# Patient Record
Sex: Female | Born: 1935 | Race: White | Hispanic: No | Smoking: Never smoker
Health system: Southern US, Community
[De-identification: ages and names within clinical notes are randomized; demographics above are authoritative.]

## PROBLEM LIST (undated history)

## (undated) DIAGNOSIS — I2510 Atherosclerotic heart disease of native coronary artery without angina pectoris: Secondary | ICD-10-CM

## (undated) DIAGNOSIS — I10 Essential (primary) hypertension: Secondary | ICD-10-CM

## (undated) DIAGNOSIS — R079 Chest pain, unspecified: Secondary | ICD-10-CM

## (undated) DIAGNOSIS — R519 Headache, unspecified: Secondary | ICD-10-CM

## (undated) DIAGNOSIS — K219 Gastro-esophageal reflux disease without esophagitis: Secondary | ICD-10-CM

## (undated) DIAGNOSIS — G8929 Other chronic pain: Secondary | ICD-10-CM

## (undated) DIAGNOSIS — G4733 Obstructive sleep apnea (adult) (pediatric): Secondary | ICD-10-CM

## (undated) DIAGNOSIS — F32A Depression, unspecified: Secondary | ICD-10-CM

## (undated) DIAGNOSIS — Z8719 Personal history of other diseases of the digestive system: Secondary | ICD-10-CM

## (undated) DIAGNOSIS — R51 Headache: Secondary | ICD-10-CM

## (undated) DIAGNOSIS — K449 Diaphragmatic hernia without obstruction or gangrene: Secondary | ICD-10-CM

## (undated) DIAGNOSIS — Z8659 Personal history of other mental and behavioral disorders: Secondary | ICD-10-CM

## (undated) DIAGNOSIS — M545 Low back pain, unspecified: Secondary | ICD-10-CM

## (undated) DIAGNOSIS — E785 Hyperlipidemia, unspecified: Secondary | ICD-10-CM

## (undated) DIAGNOSIS — C8330 Diffuse large B-cell lymphoma, unspecified site: Secondary | ICD-10-CM

## (undated) DIAGNOSIS — I6523 Occlusion and stenosis of bilateral carotid arteries: Secondary | ICD-10-CM

## (undated) DIAGNOSIS — Z853 Personal history of malignant neoplasm of breast: Secondary | ICD-10-CM

## (undated) DIAGNOSIS — Z955 Presence of coronary angioplasty implant and graft: Secondary | ICD-10-CM

## (undated) DIAGNOSIS — R0600 Dyspnea, unspecified: Secondary | ICD-10-CM

## (undated) DIAGNOSIS — M1990 Unspecified osteoarthritis, unspecified site: Secondary | ICD-10-CM

## (undated) DIAGNOSIS — F329 Major depressive disorder, single episode, unspecified: Secondary | ICD-10-CM

## (undated) DIAGNOSIS — R9389 Abnormal findings on diagnostic imaging of other specified body structures: Secondary | ICD-10-CM

## (undated) DIAGNOSIS — F419 Anxiety disorder, unspecified: Secondary | ICD-10-CM

## (undated) DIAGNOSIS — Z860100 Personal history of colon polyps, unspecified: Secondary | ICD-10-CM

## (undated) DIAGNOSIS — C8580 Other specified types of non-Hodgkin lymphoma, unspecified site: Secondary | ICD-10-CM

## (undated) DIAGNOSIS — N950 Postmenopausal bleeding: Secondary | ICD-10-CM

## (undated) HISTORY — DX: Unspecified osteoarthritis, unspecified site: M19.90

## (undated) HISTORY — DX: Depression, unspecified: F32.A

## (undated) HISTORY — DX: Other chronic pain: G89.29

## (undated) HISTORY — DX: Chest pain, unspecified: R07.9

## (undated) HISTORY — DX: Hyperlipidemia, unspecified: E78.5

## (undated) HISTORY — DX: Headache, unspecified: R51.9

## (undated) HISTORY — PX: CORONARY ANGIOPLASTY WITH STENT PLACEMENT: SHX49

## (undated) HISTORY — PX: INNER EAR SURGERY: SHX679

## (undated) HISTORY — PX: CARDIAC CATHETERIZATION: SHX172

## (undated) HISTORY — DX: Anxiety disorder, unspecified: F41.9

## (undated) HISTORY — DX: Atherosclerotic heart disease of native coronary artery without angina pectoris: I25.10

## (undated) HISTORY — DX: Major depressive disorder, single episode, unspecified: F32.9

## (undated) HISTORY — PX: KNEE ARTHROSCOPY: SUR90

## (undated) HISTORY — PX: TONSILLECTOMY: SUR1361

## (undated) HISTORY — DX: Headache: R51

---

## 2000-04-20 ENCOUNTER — Encounter: Admission: RE | Admit: 2000-04-20 | Discharge: 2000-04-20 | Payer: Self-pay | Admitting: *Deleted

## 2000-06-13 ENCOUNTER — Other Ambulatory Visit: Admission: RE | Admit: 2000-06-13 | Discharge: 2000-06-13 | Payer: Self-pay | Admitting: Gynecology

## 2000-12-22 ENCOUNTER — Other Ambulatory Visit: Admission: RE | Admit: 2000-12-22 | Discharge: 2000-12-22 | Payer: Self-pay | Admitting: Radiology

## 2000-12-22 ENCOUNTER — Encounter: Payer: Self-pay | Admitting: Family Medicine

## 2000-12-22 ENCOUNTER — Encounter: Admission: RE | Admit: 2000-12-22 | Discharge: 2000-12-22 | Payer: Self-pay | Admitting: Family Medicine

## 2000-12-22 ENCOUNTER — Encounter (INDEPENDENT_AMBULATORY_CARE_PROVIDER_SITE_OTHER): Payer: Self-pay | Admitting: *Deleted

## 2001-01-02 ENCOUNTER — Encounter: Payer: Self-pay | Admitting: Surgery

## 2001-01-03 ENCOUNTER — Encounter (INDEPENDENT_AMBULATORY_CARE_PROVIDER_SITE_OTHER): Payer: Self-pay | Admitting: *Deleted

## 2001-01-03 HISTORY — PX: MASTECTOMY WITH AXILLARY LYMPH NODE DISSECTION: SHX5661

## 2001-01-04 ENCOUNTER — Inpatient Hospital Stay (HOSPITAL_COMMUNITY): Admission: AD | Admit: 2001-01-04 | Discharge: 2001-01-05 | Payer: Self-pay | Admitting: Surgery

## 2001-01-31 ENCOUNTER — Ambulatory Visit (HOSPITAL_COMMUNITY): Admission: RE | Admit: 2001-01-31 | Discharge: 2001-01-31 | Payer: Self-pay | Admitting: Oncology

## 2001-01-31 ENCOUNTER — Encounter: Payer: Self-pay | Admitting: Oncology

## 2001-02-02 ENCOUNTER — Ambulatory Visit (HOSPITAL_COMMUNITY): Admission: RE | Admit: 2001-02-02 | Discharge: 2001-02-02 | Payer: Self-pay | Admitting: Oncology

## 2001-02-02 ENCOUNTER — Encounter: Payer: Self-pay | Admitting: Oncology

## 2001-02-07 ENCOUNTER — Encounter: Payer: Self-pay | Admitting: Oncology

## 2001-02-07 ENCOUNTER — Ambulatory Visit (HOSPITAL_COMMUNITY): Admission: RE | Admit: 2001-02-07 | Discharge: 2001-02-07 | Payer: Self-pay | Admitting: Oncology

## 2001-02-09 ENCOUNTER — Ambulatory Visit (HOSPITAL_BASED_OUTPATIENT_CLINIC_OR_DEPARTMENT_OTHER): Admission: RE | Admit: 2001-02-09 | Discharge: 2001-02-09 | Payer: Self-pay | Admitting: Surgery

## 2001-02-09 ENCOUNTER — Encounter: Payer: Self-pay | Admitting: Surgery

## 2001-03-02 ENCOUNTER — Encounter: Payer: Self-pay | Admitting: Oncology

## 2001-03-02 ENCOUNTER — Ambulatory Visit (HOSPITAL_COMMUNITY): Admission: RE | Admit: 2001-03-02 | Discharge: 2001-03-02 | Payer: Self-pay | Admitting: Oncology

## 2001-03-10 ENCOUNTER — Encounter: Payer: Self-pay | Admitting: Oncology

## 2001-03-10 ENCOUNTER — Ambulatory Visit (HOSPITAL_COMMUNITY): Admission: RE | Admit: 2001-03-10 | Discharge: 2001-03-10 | Payer: Self-pay | Admitting: Oncology

## 2001-03-30 ENCOUNTER — Encounter: Payer: Self-pay | Admitting: Surgery

## 2001-03-30 ENCOUNTER — Ambulatory Visit (HOSPITAL_BASED_OUTPATIENT_CLINIC_OR_DEPARTMENT_OTHER): Admission: RE | Admit: 2001-03-30 | Discharge: 2001-03-30 | Payer: Self-pay | Admitting: Surgery

## 2001-07-17 ENCOUNTER — Other Ambulatory Visit: Admission: RE | Admit: 2001-07-17 | Discharge: 2001-07-17 | Payer: Self-pay | Admitting: Gynecology

## 2001-08-31 ENCOUNTER — Ambulatory Visit (HOSPITAL_BASED_OUTPATIENT_CLINIC_OR_DEPARTMENT_OTHER): Admission: RE | Admit: 2001-08-31 | Discharge: 2001-08-31 | Payer: Self-pay | Admitting: Surgery

## 2001-09-04 ENCOUNTER — Encounter (INDEPENDENT_AMBULATORY_CARE_PROVIDER_SITE_OTHER): Payer: Self-pay | Admitting: *Deleted

## 2001-09-04 ENCOUNTER — Ambulatory Visit (HOSPITAL_COMMUNITY): Admission: RE | Admit: 2001-09-04 | Discharge: 2001-09-04 | Payer: Self-pay | Admitting: Gastroenterology

## 2001-10-30 ENCOUNTER — Ambulatory Visit (HOSPITAL_COMMUNITY): Admission: RE | Admit: 2001-10-30 | Discharge: 2001-10-30 | Payer: Self-pay | Admitting: Surgery

## 2001-10-30 ENCOUNTER — Encounter: Payer: Self-pay | Admitting: Surgery

## 2001-12-01 ENCOUNTER — Encounter: Payer: Self-pay | Admitting: Oncology

## 2001-12-01 ENCOUNTER — Encounter: Admission: RE | Admit: 2001-12-01 | Discharge: 2001-12-01 | Payer: Self-pay | Admitting: Oncology

## 2001-12-04 ENCOUNTER — Encounter: Admission: RE | Admit: 2001-12-04 | Discharge: 2001-12-04 | Payer: Self-pay | Admitting: Surgery

## 2001-12-04 ENCOUNTER — Encounter: Payer: Self-pay | Admitting: Surgery

## 2001-12-05 ENCOUNTER — Encounter: Admission: RE | Admit: 2001-12-05 | Discharge: 2001-12-27 | Payer: Self-pay | Admitting: Orthopedic Surgery

## 2001-12-28 ENCOUNTER — Ambulatory Visit (HOSPITAL_COMMUNITY): Admission: RE | Admit: 2001-12-28 | Discharge: 2001-12-28 | Payer: Self-pay | Admitting: Oncology

## 2001-12-28 ENCOUNTER — Encounter: Payer: Self-pay | Admitting: Oncology

## 2002-01-12 ENCOUNTER — Ambulatory Visit (HOSPITAL_COMMUNITY): Admission: RE | Admit: 2002-01-12 | Discharge: 2002-01-12 | Payer: Self-pay | Admitting: Oncology

## 2002-01-12 ENCOUNTER — Encounter: Payer: Self-pay | Admitting: Oncology

## 2002-02-15 ENCOUNTER — Encounter: Payer: Self-pay | Admitting: Internal Medicine

## 2002-08-16 ENCOUNTER — Encounter: Payer: Self-pay | Admitting: Surgery

## 2002-08-16 ENCOUNTER — Ambulatory Visit (HOSPITAL_COMMUNITY): Admission: RE | Admit: 2002-08-16 | Discharge: 2002-08-16 | Payer: Self-pay | Admitting: Surgery

## 2002-08-24 ENCOUNTER — Other Ambulatory Visit: Admission: RE | Admit: 2002-08-24 | Discharge: 2002-08-24 | Payer: Self-pay | Admitting: Gynecology

## 2002-10-19 ENCOUNTER — Ambulatory Visit (HOSPITAL_COMMUNITY): Admission: RE | Admit: 2002-10-19 | Discharge: 2002-10-19 | Payer: Self-pay | Admitting: Oncology

## 2002-10-19 ENCOUNTER — Encounter: Payer: Self-pay | Admitting: Oncology

## 2002-10-29 ENCOUNTER — Ambulatory Visit (HOSPITAL_COMMUNITY): Admission: RE | Admit: 2002-10-29 | Discharge: 2002-10-29 | Payer: Self-pay | Admitting: Oncology

## 2002-10-29 ENCOUNTER — Encounter: Payer: Self-pay | Admitting: Oncology

## 2002-12-07 ENCOUNTER — Encounter: Admission: RE | Admit: 2002-12-07 | Discharge: 2002-12-07 | Payer: Self-pay | Admitting: Surgery

## 2002-12-07 ENCOUNTER — Encounter: Payer: Self-pay | Admitting: Surgery

## 2003-02-11 ENCOUNTER — Ambulatory Visit (HOSPITAL_COMMUNITY): Admission: RE | Admit: 2003-02-11 | Discharge: 2003-02-11 | Payer: Self-pay | Admitting: Oncology

## 2003-02-13 ENCOUNTER — Ambulatory Visit (HOSPITAL_COMMUNITY): Admission: RE | Admit: 2003-02-13 | Discharge: 2003-02-13 | Payer: Self-pay | Admitting: Oncology

## 2003-02-16 HISTORY — PX: CHOLECYSTECTOMY: SHX55

## 2003-04-04 ENCOUNTER — Encounter: Admission: RE | Admit: 2003-04-04 | Discharge: 2003-04-04 | Payer: Self-pay | Admitting: Internal Medicine

## 2003-04-06 ENCOUNTER — Ambulatory Visit (HOSPITAL_COMMUNITY): Admission: RE | Admit: 2003-04-06 | Discharge: 2003-04-06 | Payer: Self-pay | Admitting: Oncology

## 2003-04-12 ENCOUNTER — Encounter: Admission: RE | Admit: 2003-04-12 | Discharge: 2003-06-04 | Payer: Self-pay | Admitting: Internal Medicine

## 2003-05-01 ENCOUNTER — Encounter: Payer: Self-pay | Admitting: Orthopedic Surgery

## 2003-05-17 ENCOUNTER — Ambulatory Visit (HOSPITAL_COMMUNITY): Admission: RE | Admit: 2003-05-17 | Discharge: 2003-05-17 | Payer: Self-pay | Admitting: Orthopedic Surgery

## 2003-06-07 ENCOUNTER — Encounter: Admission: RE | Admit: 2003-06-07 | Discharge: 2003-06-07 | Payer: Self-pay | Admitting: Surgery

## 2003-06-20 ENCOUNTER — Ambulatory Visit (HOSPITAL_COMMUNITY): Admission: RE | Admit: 2003-06-20 | Discharge: 2003-06-20 | Payer: Self-pay | Admitting: Oncology

## 2003-10-22 ENCOUNTER — Encounter: Admission: RE | Admit: 2003-10-22 | Discharge: 2004-01-20 | Payer: Self-pay | Admitting: Internal Medicine

## 2003-10-30 ENCOUNTER — Encounter: Admission: RE | Admit: 2003-10-30 | Discharge: 2003-10-30 | Payer: Self-pay | Admitting: Oncology

## 2003-11-01 ENCOUNTER — Ambulatory Visit (HOSPITAL_COMMUNITY): Admission: RE | Admit: 2003-11-01 | Discharge: 2003-11-01 | Payer: Self-pay | Admitting: Oncology

## 2004-01-17 ENCOUNTER — Ambulatory Visit (HOSPITAL_COMMUNITY): Admission: RE | Admit: 2004-01-17 | Discharge: 2004-01-17 | Payer: Self-pay | Admitting: Internal Medicine

## 2004-01-31 ENCOUNTER — Ambulatory Visit (HOSPITAL_COMMUNITY): Admission: RE | Admit: 2004-01-31 | Discharge: 2004-01-31 | Payer: Self-pay | Admitting: Internal Medicine

## 2004-02-06 ENCOUNTER — Ambulatory Visit (HOSPITAL_COMMUNITY): Admission: RE | Admit: 2004-02-06 | Discharge: 2004-02-06 | Payer: Self-pay | Admitting: Oncology

## 2004-04-22 ENCOUNTER — Ambulatory Visit: Payer: Self-pay | Admitting: Oncology

## 2004-04-23 ENCOUNTER — Ambulatory Visit (HOSPITAL_COMMUNITY): Admission: RE | Admit: 2004-04-23 | Discharge: 2004-04-23 | Payer: Self-pay | Admitting: Oncology

## 2004-09-21 ENCOUNTER — Encounter (INDEPENDENT_AMBULATORY_CARE_PROVIDER_SITE_OTHER): Payer: Self-pay | Admitting: Specialist

## 2004-09-21 ENCOUNTER — Ambulatory Visit (HOSPITAL_COMMUNITY): Admission: RE | Admit: 2004-09-21 | Discharge: 2004-09-21 | Payer: Self-pay | Admitting: Gastroenterology

## 2004-10-28 ENCOUNTER — Ambulatory Visit: Payer: Self-pay | Admitting: Oncology

## 2004-12-02 ENCOUNTER — Ambulatory Visit (HOSPITAL_COMMUNITY): Admission: RE | Admit: 2004-12-02 | Discharge: 2004-12-02 | Payer: Self-pay | Admitting: Gastroenterology

## 2004-12-11 ENCOUNTER — Encounter: Admission: RE | Admit: 2004-12-11 | Discharge: 2004-12-11 | Payer: Self-pay | Admitting: Oncology

## 2004-12-11 ENCOUNTER — Ambulatory Visit (HOSPITAL_COMMUNITY): Admission: RE | Admit: 2004-12-11 | Discharge: 2004-12-11 | Payer: Self-pay | Admitting: Surgery

## 2004-12-28 ENCOUNTER — Ambulatory Visit (HOSPITAL_COMMUNITY): Admission: RE | Admit: 2004-12-28 | Discharge: 2004-12-28 | Payer: Self-pay | Admitting: Surgery

## 2004-12-28 ENCOUNTER — Encounter (INDEPENDENT_AMBULATORY_CARE_PROVIDER_SITE_OTHER): Payer: Self-pay | Admitting: *Deleted

## 2005-03-04 ENCOUNTER — Ambulatory Visit (HOSPITAL_COMMUNITY): Admission: RE | Admit: 2005-03-04 | Discharge: 2005-03-04 | Payer: Self-pay | Admitting: Oncology

## 2005-04-26 ENCOUNTER — Other Ambulatory Visit: Admission: RE | Admit: 2005-04-26 | Discharge: 2005-04-26 | Payer: Self-pay | Admitting: Gynecology

## 2005-04-27 ENCOUNTER — Ambulatory Visit (HOSPITAL_COMMUNITY): Admission: RE | Admit: 2005-04-27 | Discharge: 2005-04-27 | Payer: Self-pay | Admitting: Oncology

## 2005-04-28 ENCOUNTER — Ambulatory Visit: Payer: Self-pay | Admitting: Oncology

## 2005-09-01 ENCOUNTER — Ambulatory Visit (HOSPITAL_COMMUNITY): Admission: RE | Admit: 2005-09-01 | Discharge: 2005-09-01 | Payer: Self-pay | Admitting: Surgery

## 2005-10-26 ENCOUNTER — Ambulatory Visit: Payer: Self-pay | Admitting: Oncology

## 2005-10-27 ENCOUNTER — Encounter: Admission: RE | Admit: 2005-10-27 | Discharge: 2005-10-27 | Payer: Self-pay | Admitting: Oncology

## 2005-10-28 LAB — CBC WITH DIFFERENTIAL/PLATELET
Basophils Absolute: 0 10*3/uL (ref 0.0–0.1)
EOS%: 1.8 % (ref 0.0–7.0)
Eosinophils Absolute: 0.2 10*3/uL (ref 0.0–0.5)
HCT: 39.7 % (ref 34.8–46.6)
HGB: 13.4 g/dL (ref 11.6–15.9)
MCH: 27.9 pg (ref 26.0–34.0)
NEUT#: 5 10*3/uL (ref 1.5–6.5)
NEUT%: 54.7 % (ref 39.6–76.8)
RDW: 15.8 % — ABNORMAL HIGH (ref 11.3–14.5)
lymph#: 3.2 10*3/uL (ref 0.9–3.3)

## 2005-10-28 LAB — COMPREHENSIVE METABOLIC PANEL
Alkaline Phosphatase: 216 U/L — ABNORMAL HIGH (ref 39–117)
Creatinine, Ser: 0.91 mg/dL (ref 0.40–1.20)
Glucose, Bld: 89 mg/dL (ref 70–99)
Sodium: 137 mEq/L (ref 135–145)
Total Bilirubin: 0.4 mg/dL (ref 0.3–1.2)
Total Protein: 6.9 g/dL (ref 6.0–8.3)

## 2005-10-28 LAB — LACTATE DEHYDROGENASE: LDH: 177 U/L (ref 94–250)

## 2005-12-13 ENCOUNTER — Encounter: Admission: RE | Admit: 2005-12-13 | Discharge: 2005-12-13 | Payer: Self-pay | Admitting: Surgery

## 2005-12-22 ENCOUNTER — Ambulatory Visit (HOSPITAL_COMMUNITY): Admission: RE | Admit: 2005-12-22 | Discharge: 2005-12-22 | Payer: Self-pay | Admitting: Surgery

## 2006-04-27 ENCOUNTER — Ambulatory Visit: Payer: Self-pay | Admitting: Oncology

## 2006-04-29 ENCOUNTER — Ambulatory Visit (HOSPITAL_COMMUNITY): Admission: RE | Admit: 2006-04-29 | Discharge: 2006-04-29 | Payer: Self-pay | Admitting: Oncology

## 2006-04-29 LAB — CBC WITH DIFFERENTIAL/PLATELET
Basophils Absolute: 0 10*3/uL (ref 0.0–0.1)
Eosinophils Absolute: 0.2 10*3/uL (ref 0.0–0.5)
HCT: 39.9 % (ref 34.8–46.6)
HGB: 13.5 g/dL (ref 11.6–15.9)
LYMPH%: 24.6 % (ref 14.0–48.0)
MCHC: 34 g/dL (ref 32.0–36.0)
MONO#: 0.5 10*3/uL (ref 0.1–0.9)
NEUT#: 4.9 10*3/uL (ref 1.5–6.5)
NEUT%: 65.7 % (ref 39.6–76.8)
Platelets: 228 10*3/uL (ref 145–400)
WBC: 7.4 10*3/uL (ref 3.9–10.0)
lymph#: 1.8 10*3/uL (ref 0.9–3.3)

## 2006-04-29 LAB — COMPREHENSIVE METABOLIC PANEL
ALT: 34 U/L (ref 0–35)
BUN: 22 mg/dL (ref 6–23)
CO2: 23 mEq/L (ref 19–32)
Calcium: 9 mg/dL (ref 8.4–10.5)
Chloride: 109 mEq/L (ref 96–112)
Creatinine, Ser: 0.92 mg/dL (ref 0.40–1.20)
Glucose, Bld: 105 mg/dL — ABNORMAL HIGH (ref 70–99)
Total Bilirubin: 0.5 mg/dL (ref 0.3–1.2)

## 2006-07-06 ENCOUNTER — Ambulatory Visit (HOSPITAL_COMMUNITY): Admission: RE | Admit: 2006-07-06 | Discharge: 2006-07-06 | Payer: Self-pay | Admitting: Otolaryngology

## 2006-09-23 ENCOUNTER — Ambulatory Visit (HOSPITAL_COMMUNITY): Admission: RE | Admit: 2006-09-23 | Discharge: 2006-09-23 | Payer: Self-pay | Admitting: Cardiology

## 2006-10-04 HISTORY — PX: TRANSTHORACIC ECHOCARDIOGRAM: SHX275

## 2006-10-24 ENCOUNTER — Ambulatory Visit: Payer: Self-pay | Admitting: Oncology

## 2006-10-26 ENCOUNTER — Ambulatory Visit: Payer: Self-pay | Admitting: Cardiovascular Disease

## 2006-10-26 LAB — CBC WITH DIFFERENTIAL/PLATELET
BASO%: 1.1 % (ref 0.0–2.0)
EOS%: 1.1 % (ref 0.0–7.0)
HCT: 40.8 % (ref 34.8–46.6)
LYMPH%: 21.9 % (ref 14.0–48.0)
MCH: 29.2 pg (ref 26.0–34.0)
MCHC: 34.8 g/dL (ref 32.0–36.0)
MCV: 83.9 fL (ref 81.0–101.0)
MONO%: 7.1 % (ref 0.0–13.0)
NEUT%: 68.8 % (ref 39.6–76.8)
Platelets: 202 10*3/uL (ref 145–400)
RBC: 4.86 10*6/uL (ref 3.70–5.32)

## 2006-10-26 LAB — COMPREHENSIVE METABOLIC PANEL
ALT: 27 U/L (ref 0–35)
AST: 22 U/L (ref 0–37)
Alkaline Phosphatase: 205 U/L — ABNORMAL HIGH (ref 39–117)
CO2: 19 mEq/L (ref 19–32)
Creatinine, Ser: 0.86 mg/dL (ref 0.40–1.20)
Total Bilirubin: 0.6 mg/dL (ref 0.3–1.2)

## 2006-10-26 LAB — LACTATE DEHYDROGENASE: LDH: 211 U/L (ref 94–250)

## 2006-11-22 ENCOUNTER — Emergency Department (HOSPITAL_COMMUNITY): Admission: EM | Admit: 2006-11-22 | Discharge: 2006-11-22 | Payer: Self-pay | Admitting: Emergency Medicine

## 2006-12-14 ENCOUNTER — Ambulatory Visit: Payer: Self-pay | Admitting: Cardiology

## 2006-12-16 ENCOUNTER — Encounter: Admission: RE | Admit: 2006-12-16 | Discharge: 2006-12-16 | Payer: Self-pay | Admitting: Oncology

## 2007-01-19 ENCOUNTER — Ambulatory Visit: Payer: Self-pay | Admitting: Cardiovascular Disease

## 2007-04-17 ENCOUNTER — Emergency Department (HOSPITAL_COMMUNITY): Admission: EM | Admit: 2007-04-17 | Discharge: 2007-04-17 | Payer: Self-pay | Admitting: Emergency Medicine

## 2007-04-21 ENCOUNTER — Ambulatory Visit: Payer: Self-pay | Admitting: Oncology

## 2007-04-25 ENCOUNTER — Encounter: Payer: Self-pay | Admitting: Internal Medicine

## 2007-04-25 LAB — CBC WITH DIFFERENTIAL/PLATELET
BASO%: 0.3 % (ref 0.0–2.0)
Basophils Absolute: 0 10*3/uL (ref 0.0–0.1)
EOS%: 2.4 % (ref 0.0–7.0)
HCT: 42.3 % (ref 34.8–46.6)
HGB: 14.6 g/dL (ref 11.6–15.9)
MCH: 28.5 pg (ref 26.0–34.0)
MCHC: 34.4 g/dL (ref 32.0–36.0)
MONO#: 0.5 10*3/uL (ref 0.1–0.9)
NEUT%: 65.9 % (ref 39.6–76.8)
RDW: 15.2 % — ABNORMAL HIGH (ref 11.3–14.5)
WBC: 9.2 10*3/uL (ref 3.9–10.0)
lymph#: 2.4 10*3/uL (ref 0.9–3.3)

## 2007-04-26 ENCOUNTER — Ambulatory Visit (HOSPITAL_COMMUNITY): Admission: RE | Admit: 2007-04-26 | Discharge: 2007-04-26 | Payer: Self-pay | Admitting: Oncology

## 2007-04-26 LAB — COMPREHENSIVE METABOLIC PANEL
ALT: 26 U/L (ref 0–35)
AST: 14 U/L (ref 0–37)
Albumin: 4.3 g/dL (ref 3.5–5.2)
Alkaline Phosphatase: 173 U/L — ABNORMAL HIGH (ref 39–117)
Calcium: 9.3 mg/dL (ref 8.4–10.5)
Chloride: 108 mEq/L (ref 96–112)
Potassium: 4.4 mEq/L (ref 3.5–5.3)

## 2007-04-26 LAB — VITAMIN D 25 HYDROXY (VIT D DEFICIENCY, FRACTURES): Vit D, 25-Hydroxy: 31 ng/mL (ref 30–89)

## 2007-04-28 LAB — VITAMIN D 1,25 DIHYDROXY: Vit D, 1,25-Dihydroxy: 51 pg/mL (ref 6–62)

## 2007-05-08 ENCOUNTER — Ambulatory Visit: Payer: Self-pay | Admitting: Cardiovascular Disease

## 2007-05-11 ENCOUNTER — Ambulatory Visit: Payer: Self-pay | Admitting: Professional

## 2007-08-28 ENCOUNTER — Ambulatory Visit: Payer: Self-pay | Admitting: Cardiovascular Disease

## 2007-09-22 ENCOUNTER — Ambulatory Visit: Payer: Self-pay | Admitting: Internal Medicine

## 2007-09-22 DIAGNOSIS — R51 Headache: Secondary | ICD-10-CM

## 2007-09-22 DIAGNOSIS — G4730 Sleep apnea, unspecified: Secondary | ICD-10-CM

## 2007-09-22 DIAGNOSIS — Z853 Personal history of malignant neoplasm of breast: Secondary | ICD-10-CM | POA: Insufficient documentation

## 2007-09-22 DIAGNOSIS — G4700 Insomnia, unspecified: Secondary | ICD-10-CM

## 2007-09-22 DIAGNOSIS — J309 Allergic rhinitis, unspecified: Secondary | ICD-10-CM | POA: Insufficient documentation

## 2007-09-22 DIAGNOSIS — E782 Mixed hyperlipidemia: Secondary | ICD-10-CM | POA: Insufficient documentation

## 2007-09-22 DIAGNOSIS — I10 Essential (primary) hypertension: Secondary | ICD-10-CM

## 2007-09-22 DIAGNOSIS — R519 Headache, unspecified: Secondary | ICD-10-CM | POA: Insufficient documentation

## 2007-10-14 ENCOUNTER — Ambulatory Visit (HOSPITAL_BASED_OUTPATIENT_CLINIC_OR_DEPARTMENT_OTHER): Admission: RE | Admit: 2007-10-14 | Discharge: 2007-10-14 | Payer: Self-pay | Admitting: Internal Medicine

## 2007-10-14 ENCOUNTER — Encounter: Payer: Self-pay | Admitting: Internal Medicine

## 2007-10-24 ENCOUNTER — Ambulatory Visit: Payer: Self-pay | Admitting: Internal Medicine

## 2007-10-24 DIAGNOSIS — R0602 Shortness of breath: Secondary | ICD-10-CM

## 2007-10-31 ENCOUNTER — Ambulatory Visit: Payer: Self-pay | Admitting: Internal Medicine

## 2007-10-31 ENCOUNTER — Telehealth (INDEPENDENT_AMBULATORY_CARE_PROVIDER_SITE_OTHER): Payer: Self-pay | Admitting: *Deleted

## 2007-11-06 ENCOUNTER — Ambulatory Visit: Payer: Self-pay | Admitting: Oncology

## 2007-11-07 ENCOUNTER — Encounter: Payer: Self-pay | Admitting: Internal Medicine

## 2007-11-08 LAB — COMPREHENSIVE METABOLIC PANEL
AST: 21 U/L (ref 0–37)
Albumin: 4.2 g/dL (ref 3.5–5.2)
Alkaline Phosphatase: 177 U/L — ABNORMAL HIGH (ref 39–117)
BUN: 17 mg/dL (ref 6–23)
Creatinine, Ser: 0.87 mg/dL (ref 0.40–1.20)
Glucose, Bld: 122 mg/dL — ABNORMAL HIGH (ref 70–99)

## 2007-11-08 LAB — CBC WITH DIFFERENTIAL/PLATELET
BASO%: 0.9 % (ref 0.0–2.0)
LYMPH%: 19.6 % (ref 14.0–48.0)
MCHC: 34 g/dL (ref 32.0–36.0)
MCV: 82.3 fL (ref 81.0–101.0)
MONO#: 0.5 10*3/uL (ref 0.1–0.9)
MONO%: 5.7 % (ref 0.0–13.0)
Platelets: 256 10*3/uL (ref 145–400)
RBC: 4.94 10*6/uL (ref 3.70–5.32)
WBC: 8.6 10*3/uL (ref 3.9–10.0)

## 2007-11-08 LAB — CANCER ANTIGEN 27.29: CA 27.29: 33 U/mL (ref 0–39)

## 2007-11-09 ENCOUNTER — Telehealth: Payer: Self-pay | Admitting: Internal Medicine

## 2007-11-09 LAB — VITAMIN D 25 HYDROXY (VIT D DEFICIENCY, FRACTURES): Vit D, 25-Hydroxy: 68 ng/mL (ref 30–89)

## 2007-11-10 ENCOUNTER — Encounter: Payer: Self-pay | Admitting: Internal Medicine

## 2007-11-15 ENCOUNTER — Encounter: Payer: Self-pay | Admitting: Cardiovascular Disease

## 2007-11-20 ENCOUNTER — Telehealth (INDEPENDENT_AMBULATORY_CARE_PROVIDER_SITE_OTHER): Payer: Self-pay | Admitting: *Deleted

## 2007-12-18 ENCOUNTER — Encounter: Admission: RE | Admit: 2007-12-18 | Discharge: 2007-12-18 | Payer: Self-pay | Admitting: Oncology

## 2007-12-22 ENCOUNTER — Ambulatory Visit: Payer: Self-pay | Admitting: Internal Medicine

## 2007-12-27 ENCOUNTER — Telehealth (INDEPENDENT_AMBULATORY_CARE_PROVIDER_SITE_OTHER): Payer: Self-pay | Admitting: *Deleted

## 2008-01-03 ENCOUNTER — Emergency Department (HOSPITAL_COMMUNITY): Admission: EM | Admit: 2008-01-03 | Discharge: 2008-01-04 | Payer: Self-pay | Admitting: Emergency Medicine

## 2008-01-16 ENCOUNTER — Telehealth: Payer: Self-pay | Admitting: Internal Medicine

## 2008-02-15 ENCOUNTER — Telehealth (INDEPENDENT_AMBULATORY_CARE_PROVIDER_SITE_OTHER): Payer: Self-pay | Admitting: *Deleted

## 2008-02-27 ENCOUNTER — Telehealth (INDEPENDENT_AMBULATORY_CARE_PROVIDER_SITE_OTHER): Payer: Self-pay | Admitting: *Deleted

## 2008-03-01 ENCOUNTER — Telehealth (INDEPENDENT_AMBULATORY_CARE_PROVIDER_SITE_OTHER): Payer: Self-pay | Admitting: *Deleted

## 2008-03-05 ENCOUNTER — Ambulatory Visit (HOSPITAL_COMMUNITY): Admission: RE | Admit: 2008-03-05 | Discharge: 2008-03-05 | Payer: Self-pay | Admitting: Surgery

## 2008-03-05 ENCOUNTER — Telehealth (INDEPENDENT_AMBULATORY_CARE_PROVIDER_SITE_OTHER): Payer: Self-pay | Admitting: *Deleted

## 2008-03-13 ENCOUNTER — Encounter: Payer: Self-pay | Admitting: Internal Medicine

## 2008-04-09 ENCOUNTER — Ambulatory Visit: Payer: Self-pay | Admitting: Internal Medicine

## 2008-04-16 ENCOUNTER — Ambulatory Visit (HOSPITAL_COMMUNITY): Admission: RE | Admit: 2008-04-16 | Discharge: 2008-04-16 | Payer: Self-pay | Admitting: Surgery

## 2008-04-23 ENCOUNTER — Ambulatory Visit (HOSPITAL_COMMUNITY): Admission: RE | Admit: 2008-04-23 | Discharge: 2008-04-23 | Payer: Self-pay | Admitting: Surgery

## 2008-05-03 ENCOUNTER — Telehealth (INDEPENDENT_AMBULATORY_CARE_PROVIDER_SITE_OTHER): Payer: Self-pay | Admitting: *Deleted

## 2008-05-10 ENCOUNTER — Ambulatory Visit: Payer: Self-pay | Admitting: Oncology

## 2008-05-15 LAB — CBC WITH DIFFERENTIAL/PLATELET
BASO%: 0.9 % (ref 0.0–2.0)
Basophils Absolute: 0.1 10*3/uL (ref 0.0–0.1)
Eosinophils Absolute: 0.3 10*3/uL (ref 0.0–0.5)
HCT: 43 % (ref 34.8–46.6)
HGB: 14.4 g/dL (ref 11.6–15.9)
MCHC: 33.4 g/dL (ref 31.5–36.0)
MONO#: 0.5 10*3/uL (ref 0.1–0.9)
NEUT#: 7.3 10*3/uL — ABNORMAL HIGH (ref 1.5–6.5)
NEUT%: 67 % (ref 38.4–76.8)
WBC: 10.9 10*3/uL — ABNORMAL HIGH (ref 3.9–10.3)
lymph#: 2.7 10*3/uL (ref 0.9–3.3)

## 2008-05-16 LAB — COMPREHENSIVE METABOLIC PANEL
AST: 18 U/L (ref 0–37)
Albumin: 4.3 g/dL (ref 3.5–5.2)
BUN: 17 mg/dL (ref 6–23)
CO2: 21 mEq/L (ref 19–32)
Calcium: 9.4 mg/dL (ref 8.4–10.5)
Chloride: 105 mEq/L (ref 96–112)
Potassium: 3.9 mEq/L (ref 3.5–5.3)

## 2008-05-27 ENCOUNTER — Encounter: Payer: Self-pay | Admitting: Internal Medicine

## 2008-05-27 ENCOUNTER — Encounter: Payer: Self-pay | Admitting: Cardiovascular Disease

## 2008-05-27 ENCOUNTER — Ambulatory Visit (HOSPITAL_COMMUNITY): Admission: RE | Admit: 2008-05-27 | Discharge: 2008-05-27 | Payer: Self-pay | Admitting: Oncology

## 2008-06-07 ENCOUNTER — Ambulatory Visit: Payer: Self-pay | Admitting: Internal Medicine

## 2008-07-08 ENCOUNTER — Telehealth: Payer: Self-pay | Admitting: Internal Medicine

## 2008-07-29 DIAGNOSIS — M1990 Unspecified osteoarthritis, unspecified site: Secondary | ICD-10-CM | POA: Insufficient documentation

## 2008-07-29 DIAGNOSIS — G44209 Tension-type headache, unspecified, not intractable: Secondary | ICD-10-CM

## 2008-07-29 DIAGNOSIS — F411 Generalized anxiety disorder: Secondary | ICD-10-CM | POA: Insufficient documentation

## 2008-07-29 DIAGNOSIS — G4730 Sleep apnea, unspecified: Secondary | ICD-10-CM | POA: Insufficient documentation

## 2008-09-03 ENCOUNTER — Telehealth: Payer: Self-pay | Admitting: Internal Medicine

## 2008-11-19 ENCOUNTER — Ambulatory Visit: Payer: Self-pay | Admitting: Internal Medicine

## 2008-11-19 ENCOUNTER — Ambulatory Visit: Payer: Self-pay | Admitting: Oncology

## 2008-11-21 LAB — CBC WITH DIFFERENTIAL/PLATELET
BASO%: 0.5 % (ref 0.0–2.0)
Basophils Absolute: 0 10*3/uL (ref 0.0–0.1)
EOS%: 1.4 % (ref 0.0–7.0)
HGB: 13.5 g/dL (ref 11.6–15.9)
MCH: 28.8 pg (ref 25.1–34.0)
MCHC: 34.6 g/dL (ref 31.5–36.0)
RBC: 4.7 10*6/uL (ref 3.70–5.45)
RDW: 15.3 % — ABNORMAL HIGH (ref 11.2–14.5)
lymph#: 2 10*3/uL (ref 0.9–3.3)

## 2008-11-22 LAB — COMPREHENSIVE METABOLIC PANEL
ALT: 22 U/L (ref 0–35)
AST: 18 U/L (ref 0–37)
Albumin: 3.8 g/dL (ref 3.5–5.2)
Alkaline Phosphatase: 169 U/L — ABNORMAL HIGH (ref 39–117)
Potassium: 4.4 mEq/L (ref 3.5–5.3)
Sodium: 141 mEq/L (ref 135–145)
Total Protein: 6.4 g/dL (ref 6.0–8.3)

## 2008-11-22 LAB — CANCER ANTIGEN 27.29: CA 27.29: 27 U/mL (ref 0–39)

## 2008-11-25 ENCOUNTER — Telehealth: Payer: Self-pay | Admitting: Internal Medicine

## 2008-12-06 ENCOUNTER — Observation Stay (HOSPITAL_COMMUNITY): Admission: EM | Admit: 2008-12-06 | Discharge: 2008-12-07 | Payer: Self-pay | Admitting: Emergency Medicine

## 2008-12-18 ENCOUNTER — Encounter: Payer: Self-pay | Admitting: Cardiology

## 2008-12-19 ENCOUNTER — Encounter: Payer: Self-pay | Admitting: Cardiology

## 2008-12-24 ENCOUNTER — Encounter (INDEPENDENT_AMBULATORY_CARE_PROVIDER_SITE_OTHER): Payer: Self-pay | Admitting: *Deleted

## 2008-12-26 ENCOUNTER — Encounter: Admission: RE | Admit: 2008-12-26 | Discharge: 2008-12-26 | Payer: Self-pay | Admitting: Oncology

## 2009-01-15 ENCOUNTER — Ambulatory Visit: Payer: Self-pay | Admitting: Cardiology

## 2009-01-15 ENCOUNTER — Encounter: Payer: Self-pay | Admitting: Cardiology

## 2009-01-15 DIAGNOSIS — R079 Chest pain, unspecified: Secondary | ICD-10-CM

## 2009-01-27 ENCOUNTER — Encounter: Payer: Self-pay | Admitting: Cardiology

## 2009-01-27 ENCOUNTER — Ambulatory Visit (HOSPITAL_COMMUNITY): Admission: RE | Admit: 2009-01-27 | Discharge: 2009-01-27 | Payer: Self-pay | Admitting: Cardiology

## 2009-02-15 HISTORY — PX: CATARACT EXTRACTION W/ INTRAOCULAR LENS  IMPLANT, BILATERAL: SHX1307

## 2009-05-16 ENCOUNTER — Ambulatory Visit (HOSPITAL_COMMUNITY): Admission: RE | Admit: 2009-05-16 | Discharge: 2009-05-16 | Payer: Self-pay | Admitting: Surgery

## 2009-06-12 ENCOUNTER — Ambulatory Visit: Payer: Self-pay | Admitting: Oncology

## 2009-06-12 LAB — CBC WITH DIFFERENTIAL/PLATELET
Eosinophils Absolute: 0.2 10*3/uL (ref 0.0–0.5)
MCV: 84.3 fL (ref 79.5–101.0)
MONO%: 5.7 % (ref 0.0–14.0)
NEUT#: 7.5 10*3/uL — ABNORMAL HIGH (ref 1.5–6.5)
RBC: 4.92 10*6/uL (ref 3.70–5.45)
RDW: 15.5 % — ABNORMAL HIGH (ref 11.2–14.5)
WBC: 10 10*3/uL (ref 3.9–10.3)

## 2009-06-13 LAB — COMPREHENSIVE METABOLIC PANEL
ALT: 28 U/L (ref 0–35)
AST: 14 U/L (ref 0–37)
Albumin: 3.9 g/dL (ref 3.5–5.2)
Alkaline Phosphatase: 155 U/L — ABNORMAL HIGH (ref 39–117)
Glucose, Bld: 116 mg/dL — ABNORMAL HIGH (ref 70–99)
Potassium: 4 mEq/L (ref 3.5–5.3)
Sodium: 141 mEq/L (ref 135–145)
Total Protein: 6.6 g/dL (ref 6.0–8.3)

## 2009-06-13 LAB — CANCER ANTIGEN 27.29: CA 27.29: 29 U/mL (ref 0–39)

## 2009-07-03 ENCOUNTER — Telehealth: Payer: Self-pay | Admitting: Cardiology

## 2009-07-04 ENCOUNTER — Telehealth: Payer: Self-pay | Admitting: Cardiology

## 2009-07-07 ENCOUNTER — Ambulatory Visit: Payer: Self-pay | Admitting: Cardiovascular Disease

## 2009-07-07 ENCOUNTER — Inpatient Hospital Stay (HOSPITAL_COMMUNITY): Admission: EM | Admit: 2009-07-07 | Discharge: 2009-07-10 | Payer: Self-pay | Admitting: Emergency Medicine

## 2009-07-07 ENCOUNTER — Encounter: Payer: Self-pay | Admitting: Cardiology

## 2009-07-12 ENCOUNTER — Telehealth (INDEPENDENT_AMBULATORY_CARE_PROVIDER_SITE_OTHER): Payer: Self-pay | Admitting: Physician Assistant

## 2009-07-22 ENCOUNTER — Telehealth: Payer: Self-pay | Admitting: Cardiology

## 2009-07-23 ENCOUNTER — Telehealth: Payer: Self-pay | Admitting: Cardiology

## 2009-07-29 ENCOUNTER — Telehealth: Payer: Self-pay | Admitting: Cardiology

## 2009-08-11 ENCOUNTER — Ambulatory Visit: Payer: Self-pay | Admitting: Cardiology

## 2009-08-11 DIAGNOSIS — I2510 Atherosclerotic heart disease of native coronary artery without angina pectoris: Secondary | ICD-10-CM

## 2009-08-12 ENCOUNTER — Telehealth: Payer: Self-pay | Admitting: Cardiology

## 2009-08-29 ENCOUNTER — Ambulatory Visit (HOSPITAL_COMMUNITY): Admission: RE | Admit: 2009-08-29 | Discharge: 2009-08-29 | Payer: Self-pay | Admitting: Gastroenterology

## 2009-09-19 ENCOUNTER — Telehealth: Payer: Self-pay | Admitting: Cardiology

## 2009-10-06 ENCOUNTER — Telehealth: Payer: Self-pay | Admitting: Cardiology

## 2009-10-23 ENCOUNTER — Ambulatory Visit: Payer: Self-pay | Admitting: Internal Medicine

## 2009-10-23 ENCOUNTER — Ambulatory Visit: Payer: Self-pay | Admitting: Cardiology

## 2009-10-23 ENCOUNTER — Encounter: Payer: Self-pay | Admitting: Physician Assistant

## 2009-10-23 DIAGNOSIS — R601 Generalized edema: Secondary | ICD-10-CM

## 2009-10-24 ENCOUNTER — Telehealth: Payer: Self-pay | Admitting: Cardiology

## 2009-10-24 LAB — CONVERTED CEMR LAB
ALT: 29 units/L (ref 0–35)
Alkaline Phosphatase: 161 units/L — ABNORMAL HIGH (ref 39–117)
Total Protein: 6.5 g/dL (ref 6.0–8.3)
VLDL: 24.4 mg/dL (ref 0.0–40.0)

## 2009-11-30 ENCOUNTER — Ambulatory Visit: Payer: Self-pay | Admitting: Cardiology

## 2009-12-01 ENCOUNTER — Inpatient Hospital Stay (HOSPITAL_COMMUNITY): Admission: EM | Admit: 2009-12-01 | Discharge: 2009-12-02 | Payer: Self-pay | Admitting: Emergency Medicine

## 2009-12-02 ENCOUNTER — Telehealth: Payer: Self-pay | Admitting: Cardiology

## 2009-12-16 ENCOUNTER — Telehealth (INDEPENDENT_AMBULATORY_CARE_PROVIDER_SITE_OTHER): Payer: Self-pay | Admitting: Radiology

## 2009-12-17 ENCOUNTER — Ambulatory Visit: Payer: Self-pay | Admitting: Oncology

## 2009-12-19 ENCOUNTER — Encounter: Payer: Self-pay | Admitting: Cardiology

## 2009-12-19 ENCOUNTER — Encounter: Payer: Self-pay | Admitting: Internal Medicine

## 2009-12-19 LAB — CBC WITH DIFFERENTIAL/PLATELET
BASO%: 0.3 % (ref 0.0–2.0)
Eosinophils Absolute: 0.1 10*3/uL (ref 0.0–0.5)
HCT: 42.3 % (ref 34.8–46.6)
HGB: 14.3 g/dL (ref 11.6–15.9)
MCH: 28.2 pg (ref 25.1–34.0)
MCV: 83.2 fL (ref 79.5–101.0)
MONO#: 0.4 10*3/uL (ref 0.1–0.9)
Platelets: 301 10*3/uL (ref 145–400)

## 2009-12-20 LAB — COMPREHENSIVE METABOLIC PANEL
ALT: 39 U/L — ABNORMAL HIGH (ref 0–35)
Albumin: 4.7 g/dL (ref 3.5–5.2)
CO2: 23 mEq/L (ref 19–32)
Calcium: 9.7 mg/dL (ref 8.4–10.5)
Chloride: 101 mEq/L (ref 96–112)
Potassium: 4 mEq/L (ref 3.5–5.3)
Sodium: 137 mEq/L (ref 135–145)
Total Bilirubin: 0.7 mg/dL (ref 0.3–1.2)
Total Protein: 7.2 g/dL (ref 6.0–8.3)

## 2009-12-20 LAB — LACTATE DEHYDROGENASE: LDH: 214 U/L (ref 94–250)

## 2009-12-24 ENCOUNTER — Ambulatory Visit: Payer: Self-pay | Admitting: Cardiology

## 2009-12-24 ENCOUNTER — Ambulatory Visit: Payer: Self-pay

## 2009-12-24 ENCOUNTER — Encounter: Payer: Self-pay | Admitting: Cardiology

## 2009-12-24 ENCOUNTER — Encounter: Payer: Self-pay | Admitting: *Deleted

## 2009-12-24 ENCOUNTER — Encounter (HOSPITAL_COMMUNITY)
Admission: RE | Admit: 2009-12-24 | Discharge: 2010-02-14 | Payer: Self-pay | Source: Home / Self Care | Attending: Cardiology | Admitting: Cardiology

## 2009-12-24 HISTORY — PX: CARDIOVASCULAR STRESS TEST: SHX262

## 2009-12-26 ENCOUNTER — Encounter: Payer: Self-pay | Admitting: Cardiology

## 2009-12-26 ENCOUNTER — Telehealth: Payer: Self-pay | Admitting: Cardiology

## 2009-12-29 ENCOUNTER — Encounter: Admission: RE | Admit: 2009-12-29 | Discharge: 2009-12-29 | Payer: Self-pay | Admitting: Surgery

## 2010-01-12 ENCOUNTER — Ambulatory Visit: Payer: Self-pay | Admitting: Cardiology

## 2010-01-15 ENCOUNTER — Telehealth: Payer: Self-pay | Admitting: Cardiology

## 2010-01-17 ENCOUNTER — Telehealth: Payer: Self-pay | Admitting: Physician Assistant

## 2010-01-19 ENCOUNTER — Telehealth: Payer: Self-pay | Admitting: Cardiology

## 2010-02-05 ENCOUNTER — Ambulatory Visit: Payer: Self-pay | Admitting: Internal Medicine

## 2010-02-23 ENCOUNTER — Telehealth: Payer: Self-pay | Admitting: Internal Medicine

## 2010-02-25 ENCOUNTER — Telehealth: Payer: Self-pay | Admitting: Cardiology

## 2010-03-07 ENCOUNTER — Encounter: Payer: Self-pay | Admitting: Oncology

## 2010-03-08 ENCOUNTER — Encounter: Payer: Self-pay | Admitting: Orthopedic Surgery

## 2010-03-08 ENCOUNTER — Encounter: Payer: Self-pay | Admitting: Oncology

## 2010-03-08 ENCOUNTER — Encounter: Payer: Self-pay | Admitting: Gastroenterology

## 2010-03-08 ENCOUNTER — Encounter: Payer: Self-pay | Admitting: Surgery

## 2010-03-09 ENCOUNTER — Encounter: Payer: Self-pay | Admitting: Oncology

## 2010-03-13 ENCOUNTER — Other Ambulatory Visit (HOSPITAL_COMMUNITY): Payer: Self-pay | Admitting: Surgery

## 2010-03-13 ENCOUNTER — Telehealth: Payer: Self-pay | Admitting: Cardiology

## 2010-03-13 DIAGNOSIS — C50919 Malignant neoplasm of unspecified site of unspecified female breast: Secondary | ICD-10-CM

## 2010-03-17 ENCOUNTER — Ambulatory Visit (HOSPITAL_COMMUNITY)
Admission: RE | Admit: 2010-03-17 | Discharge: 2010-03-17 | Payer: Self-pay | Source: Home / Self Care | Attending: Surgery | Admitting: Surgery

## 2010-03-17 ENCOUNTER — Other Ambulatory Visit (HOSPITAL_COMMUNITY): Payer: Self-pay | Admitting: Surgery

## 2010-03-17 DIAGNOSIS — C50919 Malignant neoplasm of unspecified site of unspecified female breast: Secondary | ICD-10-CM

## 2010-03-17 NOTE — Progress Notes (Signed)
Summary: nuc pre-procedure  Phone Note Outgoing Call   Call placed by: Domenic Polite, CNMT,  December 16, 2009 10:11 AM Call placed to: Patient Reason for Call: Confirm/change Appt Summary of Call: Left message with information on Myoview Information Sheet (see scanned document for details).  Initial call taken by: Domenic Polite, CNMT,  December 16, 2009 10:11 AM     Nuclear Med Background Indications for Stress Test: Evaluation for Ischemia, Stent Patency, Post Hospital  Indications Comments: [facility] D/C 12/02/09 >>CP , - enzymes  History: Heart Catheterization, History of Chemo, Myocardial Perfusion Study, Stents  History Comments: '05 NML MPI, '03 Chemo, '11 Cath/Stent LAD  Symptoms: Chest Pain    Nuclear Pre-Procedure Cardiac Risk Factors: History of Smoking, Hypertension, Lipids, NIDDM Height (in): 62

## 2010-03-17 NOTE — Letter (Signed)
Summary: ER Notification  Architectural technologist, Main Office  [address]   [place], [place] [address]   Phone: [phone]  Fax: [phone]    Jul 07, 2009 10:05 AM  [name]  The above referenced patient has been advised to report directly to the Emergency Room. Please see below for more information:  Dx: chest pain  Private Vehicle  _____X__________ or EMS:  ________________   Orders:  Yes ______ or No  ___X____   Notify upon arrival:     Trish ([phone]) [phone]         Thank you,    Architectural technologist Staff

## 2010-03-17 NOTE — Progress Notes (Signed)
Summary: pt having angina really bad  Phone Note Call from Patient Call back at Home Phone [phone]   Caller: Patient Reason for Call: Talk to Nurse, Talk to Doctor Summary of Call: pt is having agina really bad and she has taken two 81mg  asa and she dosen't know what else to do   Initial call taken by: Omer Jack,  Jul 03, 2009 12:29 PM  Follow-up for Phone Call        spoke with pt, she is having left breast pain, pain  in her jaw and in the center of her back. she has taken 2 baby asa with no relief. she sounds SOB on the phone. pt referred to [place] for eval  Follow-up by: Deliah Goody, RN,  Jul 03, 2009 1:50 PM

## 2010-03-17 NOTE — Progress Notes (Signed)
Summary: Angina--(number busy)  Phone Note Call from Patient Call back at Home Phone [phone]   Caller: Patient Reason for Call: Talk to Nurse Summary of Call: request to speak to the nurse, would not state about what Initial call taken by: Migdalia Dk,  Jul 04, 2009 12:03 PM  Follow-up for Phone Call        I spoke with the pt and over the last 2 days she has had off and on angina attacks.  The pt said she was able to sleep last night and has been okay today.  The pt did not seek treatment at the ER yesterday.  The pt  takes ASA when she has these attacks and is wondering if Dr [name] can prescibe a medication to help control her angina. I will forward this message to Dr [name] and his nurse to review on Monday when he is in the office.  Follow-up by: Julieta Gutting, RN, BSN,  Jul 04, 2009 12:27 PM  Additional Follow-up for Phone Call Additional follow up Details #1::        Patient previously referred to ER.  Ferman Hamming, MD, [facility]  Jul 04, 2009 4:43 PM Pt calling [facility] regarding her angina  call back # [phone] Judie Grieve  Jul 07, 2009 9:09 AM Tried to call pt. Number busy. Will try again to reach Dossie Arbour, RN, BSN  Jul 07, 2009 9:33 AM Spoke with pt who reports that she had to walk a long distance at church yesterday and when she got to her seat she had pain in jaw, back and chest which lasted about 4 minutes.  I instructed pt with these frequent episodes of chest pain she needs to go to ED to be evaluated. She agrees with this plan and will have friend transport her. Pt pain free at present time. Will notify ED. Additional Follow-up by: Dossie Arbour, RN, BSN,  Jul 07, 2009 10:02 AM    Additional Follow-up for Phone Call Additional follow up Details #2::    The pt did not go to the ER yesterday as recommended.  The pt said her angina went away last night and she has been fine today.  The pt would like to know if you can prescribe  medications for her to take on a daily basis to help prevent angina. Julieta Gutting, RN, BSN  Jul 04, 2009 4:55 PM  Additional Follow-up for Phone Call Additional follow up Details #3:: Details for Additional Follow-up Action Taken: Not clear chest pain is angina; I will be happy to see in office later if she feels she does not need ER. Ferman Hamming, MD, [facility]  Jul 04, 2009 4:57 PM  I have reviewed Dr Ludwig Clarks schedule and he currently does not have any openings until July.  I will forward this message to Deliah Goody RN to review his schedule for an earlier appt.  Julieta Gutting, RN, BSN  Jul 04, 2009 5:05 PM

## 2010-03-17 NOTE — Progress Notes (Signed)
Summary: pt wants to talk about pt  Phone Note Call from Patient   Caller: Patient [phone] Reason for Call: Talk to Nurse Summary of Call: pt wanted to talk with you re physical therapy-pls call [phone] Initial call taken by: Glynda Jaeger,  August 12, 2009 3:43 PM  Follow-up for Phone Call        PT WANTS YOU TO GIVE HER A REFERRAL  FOR HOME HEALTH NAME OF AGENCY IS  [facility]  TO WORK ON WALKING EXERCISES. Follow-up by: Scherrie Bateman, LPN,  August 12, 2009 3:55 PM  Additional Follow-up for Phone Call Additional follow up Details #1::        Patient needs to discuss home health issues with her primary care Ferman Hamming, MD, [facility]  August 12, 2009 6:26 PM  pt aware, i left a message for dr griffen's nurse of need for pt referral [facility] for [facility]. she will call with any questions Deliah Goody, RN  August 13, 2009 2:03 PM

## 2010-03-17 NOTE — Progress Notes (Signed)
  Phone Note Call from Patient   Caller: Patient Call For: Dr Olga Millers Reason for Call: Talk to Doctor Details for Reason: Groin Dressing Summary of Call: Pt still had dressing on from hospital. Wanted to know if she could remove it. Advised OK to remove it and shower but don't be rough with it. She has no other concerns. Initial call taken by: Park Breed PA-C,  Jul 12, 2009 12:46 PM

## 2010-03-17 NOTE — Assessment & Plan Note (Signed)
Summary: 2 [email or web address]   Referring Provider:  Dr. Evelene Croon Primary Provider:  Kirby Funk   History of Present Illness: Pleasant female with past medical history of coronary disease for followup.  She also has a history of panic attacks and atypical chest pain. She did have an echocardiogram in August of 2008 at [facility] cardiology that showed normal LV function and aortic valve sclerosis. She was admitted to [facility] in May of 2011 with chest pain and ruled out for myocardial infarction. A cardiac catheterization was performed and revealed  a normal left main, an 80% LAD, a normal circumflex and right coronary artery and the ejection fraction was 60%. The patient had a drug-eluting stent to the LAD at that time. Admitted in October of 2011 with chest pain. Enzymes negative. Liver functions mildly elevated. Patient had outpatient Myoview in November 2011 which revealed ejection fraction 72% and normal perfusion. Since then he will have mild dyspnea with prolonged ambulation but she denies orthopnea or PND. She does have chronic mild pedal edema. She's had no further chest pain.  Current Medications (verified): 1)  Fish Oil 1000 Mg  Caps (Omega-3 Fatty Acids) .... Take 1 By Mouth Once Daily 2)  Caltrate 600+d 600-400 Mg-Unit  Tabs (Calcium Carbonate-Vitamin D) .... Take 1 By Mouth Two Times A Day 3)  Magnesium Gluconate 500 Mg  Tabs (Magnesium Gluconate) .... 2-3 By Mouth Once Daily 4)  Lipitor 40 Mg Tabs (Atorvastatin Calcium) .... Take One Tablet By Mouth Daily. 5)  Arimidex 1 Mg  Tabs (Anastrozole) .... Take 1 By Mouth Once Daily 6)  Accupril 20 Mg Tabs (Quinapril Hcl) .... Take 1 By Mouth Once Daily 7)  Ativan 2 Mg  Tabs (Lorazepam) .... Take 1 By Mouth Two Times A Day 8)  Cinnamon 500 Mg  Caps (Cinnamon) .... Take 2 By Mouth Once Daily 9)  Amitriptyline Hcl 50 Mg Tabs (Amitriptyline Hcl) .Marland Kitchen.. 1 For Sleep 10)  Plavix 75 Mg Tabs (Clopidogrel Bisulfate) .... Take One Tablet By Mouth  Daily 11)  Nitrostat 0.4 Mg Subl (Nitroglycerin) .Marland Kitchen.. 1 Tablet Under Tongue At Onset of Chest Pain; You May Repeat Every 5 Minutes For Up To 3 Doses. 12)  Aspirin Ec 325 Mg Tbec (Aspirin) .... Take One Tablet By Mouth Daily  Allergies: No Known Drug Allergies  Past History:  Past Medical History: Reviewed history from 08/11/2009 and no changes required. PERSONAL HISTORY OF MALIGNANT NEOPLASM OF BREAST (ICD-V10.3) SLEEP APNEA (ICD-780.57) HYPERLIPIDEMIA (ICD-272.4) HYPERTENSION (ICD-401.9) ANXIETY (ICD-300.00) INSOMNIA WITH SLEEP APNEA UNSPECIFIED (ICD-780.51) HEADACHE, CHRONIC (ICD-784.0) OSTEOARTHRITIS (ICD-715.9 panic attacks coronary artery disease  Past Surgical History: Reviewed history from 07/29/2008 and no changes required. Cholecystectomy Breast surgery Right mastectomy Tonsillectomy [place] surgery  Social History: Reviewed history from 11/19/2008 and no changes required. The patient is divorced.  She has two children.  She is not employed.  She does not smoke cigarettes or drink alcohol.    participates in a modest amount of exercise.     Patient never smoked.   Review of Systems       no fevers or chills, productive cough, hemoptysis, dysphasia, odynophagia, melena, hematochezia, dysuria, hematuria, rash, seizure activity, orthopnea, PND,  claudication. Remaining systems are negative.   Vital Signs:  Patient profile:   75 year old female Height:      62 inches Weight:      213 pounds BMI:     39.10 Pulse rate:   110 / minute Resp:     18 per  minute BP sitting:   103 / 66  (left arm)  Vitals Entered By: Kem Parkinson (January 12, 2010 10:38 AM)  Physical Exam  General:  Well-developed well-nourished in no acute distress.  Skin is warm and dry.  HEENT is normal.  Neck is supple. No thyromegaly.  Chest is clear to auscultation with normal expansion.  Cardiovascular exam is regular rate and rhythm.  Abdominal exam nontender or distended. No masses  palpated. Extremities show 1+ edema. neuro grossly intact     Impression & Recommendations:  Problem # 1:  CAD (ICD-414.00) Continue aspirin, Plavix, ACE inhibitor and statin. Her updated medication list for this problem includes:    Accupril 20 Mg Tabs (Quinapril hcl) .Marland Kitchen... Take 1 by mouth once daily    Plavix 75 Mg Tabs (Clopidogrel bisulfate) .Marland Kitchen... Take one tablet by mouth daily    Nitrostat 0.4 Mg Subl (Nitroglycerin) .Marland Kitchen... 1 tablet under tongue at onset of chest pain; you may repeat every 5 minutes for up to 3 doses.    Aspirin Ec 325 Mg Tbec (Aspirin) .Marland Kitchen... Take one tablet by mouth daily  Her updated medication list for this problem includes:    Accupril 20 Mg Tabs (Quinapril hcl) .Marland Kitchen... Take 1 by mouth once daily    Plavix 75 Mg Tabs (Clopidogrel bisulfate) .Marland Kitchen... Take one tablet by mouth daily    Nitrostat 0.4 Mg Subl (Nitroglycerin) .Marland Kitchen... 1 tablet under tongue at onset of chest pain; you may repeat every 5 minutes for up to 3 doses.    Aspirin Ec 325 Mg Tbec (Aspirin) .Marland Kitchen... Take one tablet by mouth daily  Problem # 2:  CHEST PAIN (ICD-786.50) No further symptoms.She has had problems with atypical chest pain in the past; myoview negative. Her updated medication list for this problem includes:    Accupril 20 Mg Tabs (Quinapril hcl) .Marland Kitchen... Take 1 by mouth once daily    Plavix 75 Mg Tabs (Clopidogrel bisulfate) .Marland Kitchen... Take one tablet by mouth daily    Nitrostat 0.4 Mg Subl (Nitroglycerin) .Marland Kitchen... 1 tablet under tongue at onset of chest pain; you may repeat every 5 minutes for up to 3 doses.    Aspirin Ec 325 Mg Tbec (Aspirin) .Marland Kitchen... Take one tablet by mouth daily  Problem # 3:  HYPERLIPIDEMIA (ICD-272.4) Continue statin. Her updated medication list for this problem includes:    Lipitor 40 Mg Tabs (Atorvastatin calcium) .Marland Kitchen... Take one tablet by mouth daily.  Problem # 4:  HYPERTENSION (ICD-401.9) Blood pressure controlled on present medications. Will continue. Her updated  medication list for this problem includes:    Accupril 20 Mg Tabs (Quinapril hcl) .Marland Kitchen... Take 1 by mouth once daily    Aspirin Ec 325 Mg Tbec (Aspirin) .Marland Kitchen... Take one tablet by mouth daily  Problem # 5:  SLEEP APNEA (ICD-780.57)  Patient Instructions: 1)  Your physician wants you to follow-up in: 6 MONTHS  You will receive a reminder letter in the mail two months in advance. If you don't receive a letter, please call our office to schedule the follow-up appointment. Prescriptions: LIPITOR 40 MG TABS (ATORVASTATIN CALCIUM) Take one tablet by mouth daily.  #30 x 12   Entered by:   Deliah Goody, RN   Authorized by:   Ferman Hamming, MD, [facility]   Signed by:   Deliah Goody, RN on 01/12/2010   Method used:   Electronically to        [id number] (retail)       [address]  [place], [place]  [address]       Ph: [phone] or [phone]       Fax: [phone]   RxID:   [id number]

## 2010-03-17 NOTE — Progress Notes (Signed)
Summary: orders for blood test  Phone Note Call from Patient   Caller: Patient  [phone] Reason for Call: Talk to Nurse Summary of Call: pt has appt w/ the pa-said dr [name] wants a lipid profile-can get order for that-pt also requesting a c reative protein blood test she saw on tv, wants to know if she can have this done as well? Initial call taken by: Glynda Jaeger,  October 06, 2009 8:41 AM  Follow-up for Phone Call        spoke with pt, she will have labs done on the day of her appt Deliah Goody, RN  October 06, 2009 2:50 PM

## 2010-03-17 NOTE — Progress Notes (Signed)
Summary: in hospital has question -pls call before 11a.m  Phone Note Call from Patient Call back at Home Phone [phone]   Caller: Patient- hospital # [phone] Reason for Call: Talk to Nurse Summary of Call: Per in the hospital at [facility] room # [phone]. have question before she leaves today.  Initial call taken by: Lorne Skeens,  December 02, 2009 10:06 AM  Follow-up for Phone Call        I spoke with pt who is still in hospital but states she is to be discharged today. Pt reports she is to have a stress test and wants to know when it is scheduled.  I gave pt  scheduled stress test appt. date and time-December 17, 2009 at 9 AM. Pt does not want to keep scheduled appt. with Dr. [name] on December 24, 2009 because she does not think she needs to be seen again for 6 months. I explained to pt she should keep this appt with Dr. [name] to follow up on hospital stay and stress test.  Pt agreed to keep appointment but wants Stanton Kidney to call her to discuss.  Will forward note to Deliah Goody, RN. Pt aware Stanton Kidney is out of office until Thursday Follow-up by: Dossie Arbour, RN, BSN,  December 02, 2009 12:49 PM  Additional Follow-up for Phone Call Additional follow up Details #1::        spoke with pt, she will come to the appointments as scheduled Deliah Goody, RN  December 04, 2009 5:37 PM

## 2010-03-17 NOTE — Progress Notes (Signed)
Summary: pt still having angina attack  Phone Note Call from Patient Call back at Home Phone [phone]   Caller: Patient Reason for Call: Talk to Nurse, Talk to Doctor Summary of Call: pt is continuing to have angina attacks please call her after 12noon Initial call taken by: Omer Jack,  October 24, 2009 9:38 AM  Follow-up for Phone Call        spoke with pt, she states she had an episode of angina at 2am this am. it took 3 NTG to get relief, then she had another episode at 4am and got relief with one NTG. she is pain free at present. dr [name] aware of above and pt instructed if she has another episode she would need to call 911 and go to [facility] for eval. pt voiced understanding Deliah Goody, RN  October 24, 2009 2:54 PM

## 2010-03-17 NOTE — Letter (Signed)
Summary: [facility]  [facility]   Imported By: Sherian Rein 01/06/2010 14:53:08  _____________________________________________________________________  External Attachment:    Type:   Image     Comment:   External Document

## 2010-03-17 NOTE — Assessment & Plan Note (Signed)
Summary: angina pain/post stent /nm   Visit Type:  Follow-up Referring Provider:  Dr. Evelene Croon Primary Provider:  Kirby Funk  CC:  Angina- feet edema.  History of Present Illness: this is a 75 year old white female patient with history of coronary artery disease status post drug-eluting stent to the LAD in May 2011. At that time she had normal circumflex and RCA ejection fraction 60%.  The patient states she had one episode of jaw pain with some pain in her back that happened approximately 3 weeks ago. She took one nitroglycerin and the pain eased within one minute. She doesn't recall what she was doing at that time but she is pretty much wheeled chair bound from her arthritis. She denies any further chest pain or jaw pain. She also has had some ankle edema for which her primary care physician gave her Lasix for 3 days but she doesn't think it really helped. Her blood pressures has been stable.  Patient denies any exertional chest tightness, pressure, dyspnea, dyspnea on exertion, dizziness, or presyncope.  Current Medications (verified): 1)  Fish Oil 1000 Mg  Caps (Omega-3 Fatty Acids) .... Take 1 By Mouth Once Daily 2)  Caltrate 600+d 600-400 Mg-Unit  Tabs (Calcium Carbonate-Vitamin D) .... Take 1 By Mouth Two Times A Day 3)  Magnesium Gluconate 500 Mg  Tabs (Magnesium Gluconate) .... 2-3 By Mouth Once Daily 4)  Lipitor 40 Mg Tabs (Atorvastatin Calcium) .... Take One Tablet By Mouth Daily. 5)  Arimidex 1 Mg  Tabs (Anastrozole) .... Take 1 By Mouth Once Daily 6)  Accupril 20 Mg Tabs (Quinapril Hcl) .... Take 1 By Mouth Once Daily 7)  Ativan 2 Mg  Tabs (Lorazepam) .... Take 1 By Mouth Two Times A Day 8)  Cinnamon 500 Mg  Caps (Cinnamon) .... Take 2 By Mouth Once Daily 9)  Amitriptyline Hcl 50 Mg Tabs (Amitriptyline Hcl) .Marland Kitchen.. 1 For Sleep 10)  Plavix 75 Mg Tabs (Clopidogrel Bisulfate) .... Take One Tablet By Mouth Daily 11)  Nitrostat 0.4 Mg Subl (Nitroglycerin) .Marland Kitchen.. 1 Tablet Under Tongue At  Onset of Chest Pain; You May Repeat Every 5 Minutes For Up To 3 Doses. 12)  Aspirin Ec 325 Mg Tbec (Aspirin) .... Take One Tablet By Mouth Daily  Allergies (verified): No Known Drug Allergies  Past History:  Past Medical History: Last updated: 08/11/2009 PERSONAL HISTORY OF MALIGNANT NEOPLASM OF BREAST (ICD-V10.3) SLEEP APNEA (ICD-780.57) HYPERLIPIDEMIA (ICD-272.4) HYPERTENSION (ICD-401.9) ANXIETY (ICD-300.00) INSOMNIA WITH SLEEP APNEA UNSPECIFIED (ICD-780.51) HEADACHE, CHRONIC (ICD-784.0) OSTEOARTHRITIS (ICD-715.9 panic attacks coronary artery disease  Family History: Reviewed history from 07/29/2008 and no changes required.  Her mother died at age 41 and father died at age 31.   She does not know their history well and does not know what medical   problems they have had.  She has a brother who died of a brain tumor.      Social History: Reviewed history from 11/19/2008 and no changes required.   The patient is divorced.  She has two children.  She is   not employed.  She does not smoke cigarettes or drink alcohol.     participates in a modest amount of exercise.     Patient never smoked.   Review of Systems       see history of present illness  Vital Signs:  Patient profile:   75 year old female Height:      62 inches Weight:      218 pounds BMI:  40.02 Pulse rate:   97 / minute Pulse rhythm:   regular Resp:     18 per minute BP sitting:   114 / 64  (left arm) Cuff size:   large  Vitals Entered By: Vikki Ports (October 23, 2009 2:09 PM)  Physical Exam  General:  Obese, in no acute distress. Neck: No JVD, HJR, Bruit, or thyroid enlargement Lungs: No tachypnea, clear without wheezing, rales, or rhonchi Cardiovascular: RRR, PMI not displaced, heart sounds normal, no murmurs, gallops, bruit, thrill, or heave. Abdomen: BS normal. Soft without organomegaly, masses, lesions or tenderness. Extremities: trace of ankle edema bilaterally,without cyanosis,  clubbing . Good distal pulses bilateral SKin: Warm, no lesions or rashes  Musculoskeletal: No deformities Neuro: no focal signs    EKG  Procedure date:  10/23/2009  Findings:      normal sinus rhythm no acute change  Impression & Recommendations:  Problem # 1:  CHEST PAIN (ICD-786.50) Patient had one episode of chest pain and jaw pain approximately 3 weeks ago relieved with nitroglycerin and lasting under a minute. She has had no further chest pain. She did have a stent placed in May 2011 to her LAD. She had normal circumflex and RCA at that time. We will continue medical management at this point. I asked her to call us if she has any further chest pain. Her updated medication list for this problem includes:    Accupril 20 Mg Tabs (Quinapril hcl) .Marland Kitchen... Take 1 by mouth once daily    Plavix 75 Mg Tabs (Clopidogrel bisulfate) .Marland Kitchen... Take one tablet by mouth daily    Nitrostat 0.4 Mg Subl (Nitroglycerin) .Marland Kitchen... 1 tablet under tongue at onset of chest pain; you may repeat every 5 minutes for up to 3 doses.    Aspirin Ec 325 Mg Tbec (Aspirin) .Marland Kitchen... Take one tablet by mouth daily  Problem # 2:  CAD (ICD-414.00) see above dictation Her updated medication list for this problem includes:    Accupril 20 Mg Tabs (Quinapril hcl) .Marland Kitchen... Take 1 by mouth once daily    Plavix 75 Mg Tabs (Clopidogrel bisulfate) .Marland Kitchen... Take one tablet by mouth daily    Nitrostat 0.4 Mg Subl (Nitroglycerin) .Marland Kitchen... 1 tablet under tongue at onset of chest pain; you may repeat every 5 minutes for up to 3 doses.    Aspirin Ec 325 Mg Tbec (Aspirin) .Marland Kitchen... Take one tablet by mouth daily  Problem # 3:  EDEMA (ICD-782.3) Patient only has a trace of edema in her ankles. I think is more related to her peripheral vascular disease and sitting in a wheelchair most of the day. I've asked her to elevate her legs and have given her prescription for TED hose. I don't believe she needs a diuretic at this point.  Problem # 4:  HYPERLIPIDEMIA  (ICD-272.4) Patient scheduled for a lipid profile today. Her updated medication list for this problem includes:    Lipitor 40 Mg Tabs (Atorvastatin calcium) .Marland Kitchen... Take one tablet by mouth daily.  Other Orders: EKG w/ Interpretation (93000)  Patient Instructions: 1)  Your physician recommends that you schedule a follow-up appointment in: 2 months with Dr. [name] 2)  Your physician has requested that you limit the intake of sodium (salt) in your diet to two grams daily. Please see [facility] handout. 3)  Your MD recommends for you to keep Legs elevated during the day 4)  Your physician recommends that you continue on your current medications as directed. Please refer to the Current Medication  list given to you today. Prescription given for TED.

## 2010-03-17 NOTE — Progress Notes (Signed)
  Phone Note Call from Patient Call back at [facility] Phone [phone]   Caller: Patient Summary of Call: Pt have question about exercise program Initial call taken by: Judie Grieve,  July 23, 2009 11:11 AM     Appended Document: Phone Note: Rehab    Phone Note Outgoing Call   Call placed by: Bernita Raisin, RN, BSN,  July 23, 2009 3:20 PM Call placed to: Patient Summary of Call: Pt states she is unable to do rehab at [facility] due to her arthritis in knees and unable to walk with their program. Pt wants to do chair exercises and participate in open swimming at a facility she can travel to without assistance. Bernita Raisin, RN, BSN  July 23, 2009 3:24 PM       Appended Document:  ok  Appended Document:  LINE BUSY X1./CY  Appended Document:  PT AWARE./CY

## 2010-03-17 NOTE — Progress Notes (Signed)
Summary: PT REQUEST CALL  Phone Note Call from Patient Call back at [facility] Phone [phone]   Caller: Patient Summary of Call: PT REQUEST CALL Initial call taken by: Judie Grieve,  January 19, 2010 2:01 PM  Follow-up for Phone Call        Pt calls today b/c she had angina on 12/3 relieved by 4 sl NTG.  She experience jaw, back, and right chest pain intermittently independent of one another.  She called the office and was advised.  She refuses EMS as it is too $$ according to patient.  Today she is painfree.  She states " I do not believe for a minute that I have another blockage"  She has been told by MD in the ED that stress tests are " only about 80% accurate".    She states she has been compliant with her medications and is working hard on her diet.  She is aware Dr. [name] & Stanton Kidney are out of the office and would appreciate a call back regarding medication.She wants to know if there is any medication for long term treatment of angina? Mylo Red RN     Appended Document: PT REQUEST CALL add imdur 30 mg by mouth daily  Appended Document: PT REQUEST CALL pt aware./cy

## 2010-03-17 NOTE — Progress Notes (Signed)
Summary: WANTS SOONER APPT  Phone Note Call from Patient Call back at [facility] Phone [phone]   Caller: Patient [phone] Reason for Call: Talk to Nurse Summary of Call: PT PER CALL EXPRESSING CONCERNS RE SOONER APPT-PT AWARE THAT [facility] IS INTO LATE JULY FOR BOTH LOCATIONS-PT ACCEPTED APPT 7-7 WITH PA WHEN SHE CALLED THIS AM-NOW WANTS SOONER APPT Initial call taken by: Glynda Jaeger,  July 29, 2009 2:52 PM  Follow-up for Phone Call        spoke with pt, she is not having any problems at present. she will keep the appt with the pa on 08/21/09 and call with problems prior to that appt Deliah Goody, RN  July 30, 2009 8:56 AM

## 2010-03-17 NOTE — Assessment & Plan Note (Signed)
Summary: eph/jml   Referring Provider:  Dr. Evelene Croon Primary Provider:  Kirby Funk   History of Present Illness: Pleasant female with past medical history of coronary disease or followup.  She also has a history of panic attacks and atypical chest pain. She did have an echocardiogram in August of 2008 at [facility] cardiology that showed normal LV function and aortic valve sclerosis. She was admitted to [facility] in May of 2011 with chest pain and ruled out for myocardial infarction. A cardiac catheterization was performed and revealed  a normal left main, an 80% LAD, a normal circumflex and right coronary artery and the ejection fraction was 60%. The patient had a drug-eluting stent to the LAD at that time. Since then she had dyspnea with moderate activities but not with routine activities. It is relieved with rest. No orthopnea, PND or pedal edema she she had one episode jaw pain that she thought might be related to her heart but she is not having any recurrent chest pain or symptoms similar to when she went to the hospital.  Current Medications (verified): 1)  Fish Oil 1000 Mg  Caps (Omega-3 Fatty Acids) .... Take 1 By Mouth Once Daily 2)  Caltrate 600+d 600-400 Mg-Unit  Tabs (Calcium Carbonate-Vitamin D) .... Take 1 By Mouth Two Times A Day 3)  Magnesium Gluconate 500 Mg  Tabs (Magnesium Gluconate) .... 2-3 By Mouth Once Daily 4)  Lipitor 40 Mg Tabs (Atorvastatin Calcium) .... Take One Tablet By Mouth Daily. 5)  Arimidex 1 Mg  Tabs (Anastrozole) .... Take 1 By Mouth Once Daily 6)  Accupril 20 Mg Tabs (Quinapril Hcl) .... Take 1 By Mouth Once Daily 7)  Ativan 2 Mg  Tabs (Lorazepam) .... Take 1 By Mouth Two Times A Day 8)  Cinnamon 500 Mg  Caps (Cinnamon) .... Take 2 By Mouth Once Daily 9)  Amitriptyline Hcl 50 Mg Tabs (Amitriptyline Hcl) .Marland Kitchen.. 1 For Sleep 10)  Plavix 75 Mg Tabs (Clopidogrel Bisulfate) .... Take One Tablet By Mouth Daily 11)  Nitrostat 0.4 Mg Subl (Nitroglycerin) .Marland Kitchen.. 1  Tablet Under Tongue At Onset of Chest Pain; You May Repeat Every 5 Minutes For Up To 3 Doses. 12)  Aspirin Ec 325 Mg Tbec (Aspirin) .... Take One Tablet By Mouth Daily  Allergies: No Known Drug Allergies  Past History:  Past Medical History: PERSONAL HISTORY OF MALIGNANT NEOPLASM OF BREAST (ICD-V10.3) SLEEP APNEA (ICD-780.57) HYPERLIPIDEMIA (ICD-272.4) HYPERTENSION (ICD-401.9) ANXIETY (ICD-300.00) INSOMNIA WITH SLEEP APNEA UNSPECIFIED (ICD-780.51) HEADACHE, CHRONIC (ICD-784.0) OSTEOARTHRITIS (ICD-715.9 panic attacks coronary artery disease  Past Surgical History: Reviewed history from 07/29/2008 and no changes required. Cholecystectomy Breast surgery Right mastectomy Tonsillectomy [place]ne[place] surgery  Social History: Reviewed history from 11/19/2008 and no changes required.   The patient is divorced.  She has two children.  She is   not employed.  She does not smoke cigarettes or drink alcohol.     participates in a modest amount of exercise.     Patient never smoked.   Review of Systems       Significant arthralgias in her lower extremities but no fevers or chills, productive cough, hemoptysis, dysphasia, odynophagia, melena, hematochezia, dysuria, hematuria, rash, seizure activity, orthopnea, PND, pedal edema, claudication. Remaining systems are negative.   Vital Signs:  Patient profile:   75 year old female Height:      62 inches Weight:      213 pounds BMI:     39.10 Pulse rate:   98 / minute Resp:  18 per minute BP sitting:   118 / 80  (left arm)  Vitals Entered By: Kem Parkinson (August 11, 2009 10:10 AM)  Physical Exam  General:  Well-developed well-nourished in no acute distress.  Skin is warm and dry.  HEENT is normal.  Neck is supple. No thyromegaly.  Chest is clear to auscultation with normal expansion.  Cardiovascular exam is regular rate and rhythm.  Abdominal exam nontender or distended. No masses palpated. Extremities show no edema. neuro  grossly intact    EKG  Procedure date:  08/11/2009  Findings:      Normal sinus rhythm at a rate of 75. Poor R-wave progression. Possible lead reversal precordial leads.  Impression & Recommendations:  Problem # 1:  CAD (ICD-414.00) Continue aspirin, Plavix, ACE inhibitor and statin. Her updated medication list for this problem includes:    Accupril 20 Mg Tabs (Quinapril hcl) .Marland Kitchen... Take 1 by mouth once daily    Plavix 75 Mg Tabs (Clopidogrel bisulfate) .Marland Kitchen... Take one tablet by mouth daily    Nitrostat 0.4 Mg Subl (Nitroglycerin) .Marland Kitchen... 1 tablet under tongue at onset of chest pain; you may repeat every 5 minutes for up to 3 doses.    Aspirin Ec 325 Mg Tbec (Aspirin) .Marland Kitchen... Take one tablet by mouth daily  Her updated medication list for this problem includes:    Accupril 20 Mg Tabs (Quinapril hcl) .Marland Kitchen... Take 1 by mouth once daily    Plavix 75 Mg Tabs (Clopidogrel bisulfate) .Marland Kitchen... Take one tablet by mouth daily    Nitrostat 0.4 Mg Subl (Nitroglycerin) .Marland Kitchen... 1 tablet under tongue at onset of chest pain; you may repeat every 5 minutes for up to 3 doses.    Aspirin Ec 325 Mg Tbec (Aspirin) .Marland Kitchen... Take one tablet by mouth daily  Problem # 2:  HYPERLIPIDEMIA (ICD-272.4)  Continue statin. Check lipids and liver. Her updated medication list for this problem includes:    Lipitor 40 Mg Tabs (Atorvastatin calcium) .Marland Kitchen... Take one tablet by mouth daily.  Orders: T-Hepatic Function [phone]) T-Lipid Profile [phone])  Her updated medication list for this problem includes:    Lipitor 40 Mg Tabs (Atorvastatin calcium) .Marland Kitchen... Take one tablet by mouth daily.  Problem # 3:  HYPERTENSION (ICD-401.9)  Blood pressure controlled on present medications. Will continue. Her updated medication list for this problem includes:    Accupril 20 Mg Tabs (Quinapril hcl) .Marland Kitchen... Take 1 by mouth once daily    Aspirin Ec 325 Mg Tbec (Aspirin) .Marland Kitchen... Take one tablet by mouth daily  Her updated medication  list for this problem includes:    Accupril 20 Mg Tabs (Quinapril hcl) .Marland Kitchen... Take 1 by mouth once daily    Aspirin Ec 325 Mg Tbec (Aspirin) .Marland Kitchen... Take one tablet by mouth daily  Problem # 4:  SLEEP APNEA (ICD-780.57)  Patient Instructions: 1)  Your physician recommends that you schedule a follow-up appointment in: 6 MONTHS IN [place] 2)  Your physician recommends that you return for lab work in: TAKE PAPERWORK TO THE [facility] IN [place] PRIOR TO BREAKFAST ONE MORNING

## 2010-03-17 NOTE — Progress Notes (Signed)
Summary: call re angina attack  Phone Note Call from Patient   Caller: Patient [phone] Reason for Call: Talk to Nurse Summary of Call: pt calling re an angina attack-pls call [phone] Initial call taken by: Glynda Jaeger,  September 19, 2009 2:41 PM  Follow-up for Phone Call        Spoke with pt. Patient states she had angina pain  on chest and back which lasted for 2 to 3 minutes 2 nights age . Pt states had stent placement on may 25th/11 with Dr. Juanda Chance.  She is to see Dr. [name]  in november/11. She would like to be seen sooner. An appointment was made for pt. with the PA on 09/30/09 at 10:00 AM. Pt aware. I adviced pt. to go to the ER if she has angina pain again. Pt. verbalized understanding.  Follow-up by: Ollen Gross, RN, BSN,  September 19, 2009 5:42 PM

## 2010-03-17 NOTE — Progress Notes (Signed)
Summary: pt returned christine's call  Phone Note Call from Patient   Caller: Patient [phone] Reason for Call: Talk to Nurse Summary of Call: pt returned call from christine Initial call taken by: Glynda Jaeger,  December 26, 2009 11:17 AM  Follow-up for Phone Call        Phone Call Completed PT AWARE OF MYOVIEW RESULTS. Follow-up by: Scherrie Bateman, LPN,  December 26, 2009 12:02 PM

## 2010-03-17 NOTE — Progress Notes (Signed)
  Phone Note Call from Patient   Summary of Call: pt called re: recurrent angina today..has had 4 episodes, so far. has taken 1 sl ntg in each case, with prompt relief. currently pain free  I advised her to come directly to [facility] ED. she refuses to call EMS, but has agreed to be transported by a friend, if she has another episode of CP. Initial call taken by: Nelida Meuse, PA-C,  January 17, 2010 4:33 PM

## 2010-03-17 NOTE — Progress Notes (Signed)
Summary: Calling with question about [facility]  Phone Note Call from Patient Call back at Home Phone [phone]   Caller: Patient Summary of Call: Pt have question about a [facility]  Initial call taken by: Judie Grieve,  July 22, 2009 3:08 PM  Follow-up for Phone Call        Called patient back..she had concerns about starting cardiac rehab because she has stated that she does not walk well because of arthritis in her legs. She also wants a referral to dietary. Advised her to discuss concerns about cardiac rehab at her MD appointment next week and also explained to her that part of cardiac rehab are classes on food choices. [facility] home for physical therapy.   Follow-up by: Suzan Garibaldi RN

## 2010-03-17 NOTE — Progress Notes (Signed)
Summary: breast cancer survivor  Phone Note Call from Patient   Caller: Patient Reason for Call: Talk to Nurse Summary of Call: she is a nine-year breast cancer survivor. pt wanted to let the dr know. Initial call taken by: Roe Coombs,  January 15, 2010 1:16 PM  Follow-up for Phone Call        spoke with pt, she just wanted to let us know she is doing well Deliah Goody, RN  January 15, 2010 5:26 PM

## 2010-03-17 NOTE — Assessment & Plan Note (Signed)
Summary: Cardiology Nuclear Testing  Nuclear Med Background Indications for Stress Test: Evaluation for Ischemia, Stent Patency, Post Hospital  Indications Comments: 12/02/09 Chest pain; (-) enzymes  History: Heart Catheterization, History of Chemo, Myocardial Perfusion Study, Stents  History Comments: '05 [id number]; 5/11 Stent-LAD  Symptoms: Chest Pain, Fatigue  Symptoms Comments: Last episode of FG:HWEX since d/c.   Nuclear Pre-Procedure Cardiac Risk Factors: History of Smoking, Hypertension, Lipids, NIDDM, Obesity Caffeine/Decaff Intake: Yes NPO After: 7:00 AM Lungs: Clear.  O2 Sat 98% on RA. IV 0.9% NS with Angio Cath: 24g     IV Site: L Antecubital IV Started by: Bonnita Levan, RN Chest Size (in) 50     Cup Size B     Height (in): 62 Weight (lb): 213 BMI: 39.10 Tech Comments: Patient had a peppermint patty at 7:00 a.m., OK to stress per Burna Mortimer.  Nuclear Med Study 1 or 2 day study:  1 day     Stress Test Type:  Eugenie Birks Reading MD:  Cassell Clement, MD     Referring MD:  Olga Millers, MD Resting Radionuclide:  Technetium 34m Tetrofosmin     Resting Radionuclide Dose:  10.0 mCi  Stress Radionuclide:  Technetium 41m Tetrofosmin     Stress Radionuclide Dose:  33.0 mCi   Stress Protocol   Lexiscan: 0.4 mg   Stress Test Technologist:  Rea College, CMA-N     Nuclear Technologist:  Doyne Keel, CNMT  Rest Procedure  Myocardial perfusion imaging was performed at rest 45 minutes following the intravenous administration of Technetium 63m Tetrofosmin.  Stress Procedure  The patient received IV Lexiscan 0.4 mg over 15-seconds.  Technetium 110m Tetrofosmin injected at 30-seconds.  There were no significant changes with infusion.  Quantitative spect images were obtained after a 45 minute delay.  QPS Raw Data Images:  Normal; no motion artifact; normal heart/lung ratio. Stress Images:  Normal homogeneous uptake in all areas of the myocardium. Rest Images:  Normal homogeneous  uptake in all areas of the myocardium. Subtraction (SDS):  No evidence of ischemia. Transient Ischemic Dilatation:  0.98  (Normal <1.22)  Lung/Heart Ratio:  0.30  (Normal <0.45)  Quantitative Gated Spect Images QGS EDV:  44 ml QGS ESV:  12 ml QGS EF:  72 % QGS cine images:  Normal LV systolic function.  Findings Normal nuclear study      Overall Impression  Exercise Capacity: Lexiscan with no exercise. BP Response: Normal blood pressure response. Clinical Symptoms: No chest pain ECG Impression: No significant ST segment change suggestive of ischemia. Overall Impression: Normal stress nuclear study.  Appended Document: Cardiology Nuclear Testing ok  Appended Document: Cardiology Nuclear Testing LMTCB./CY  Appended Document: Cardiology Nuclear Testing PT AWARE./CY

## 2010-03-18 ENCOUNTER — Ambulatory Visit (HOSPITAL_COMMUNITY)
Admission: RE | Admit: 2010-03-18 | Discharge: 2010-03-18 | Disposition: A | Discharge status: Home / Self Care | Payer: MEDICARE | Source: Ambulatory Visit | Attending: Surgery | Admitting: Surgery

## 2010-03-18 ENCOUNTER — Encounter (HOSPITAL_COMMUNITY): Payer: Self-pay

## 2010-03-18 DIAGNOSIS — Z9221 Personal history of antineoplastic chemotherapy: Secondary | ICD-10-CM | POA: Insufficient documentation

## 2010-03-18 DIAGNOSIS — Z9010 Acquired absence of unspecified breast and nipple: Secondary | ICD-10-CM | POA: Insufficient documentation

## 2010-03-18 DIAGNOSIS — Z853 Personal history of malignant neoplasm of breast: Secondary | ICD-10-CM | POA: Insufficient documentation

## 2010-03-18 DIAGNOSIS — C50919 Malignant neoplasm of unspecified site of unspecified female breast: Secondary | ICD-10-CM

## 2010-03-18 HISTORY — DX: Essential (primary) hypertension: I10

## 2010-03-18 LAB — CREATININE, SERUM
Creatinine, Ser: 0.87 mg/dL (ref 0.4–1.2)
GFR calc Af Amer: >60 mL/min (ref >60)

## 2010-03-18 MED ORDER — GADOBENATE DIMEGLUMINE 529 MG/ML IV SOLN: 0.1000 mmol/kg | Freq: Once | INTRAVENOUS | Status: AC | PRN

## 2010-03-19 NOTE — Assessment & Plan Note (Signed)
Summary: F/U PER PT REQUEST //KP   Copy to:  Dr. Evelene Croon Primary Provider/Referring Provider:  Kirby Funk  CC:  pt states breathing has unchnaged. pt c/o dry cough. pt has no complaints on her sleep. pt states she sleeps well. Marland Kitchen  History of Present Illness: 06/07/08- Allergic rhinitis, OSA She is trying to use cpap every night. It definitiely helps her sleep and she doesn't want to try without it. still pending completion of download from DME company. Uses Amitryptilline for sleep. Not outdoors much due to falls. Wants to sit outside. Not having much rhinitis but does note some sneezing. Some occasional dry cough- minor.  November 19, 2008 Dry cough occasionally without wheeze.  Blames Fall seasonal allergy for watery rhinorhea  Denies bloody mucus, fever, palpitation.. Occasional angina- she has discussed with Dr Valentina Lucks. Walking tolerance is better with less exertional dyspnea. She had quit and turned cpap back in.  February 05, 2010- Allergic rhinitis, OSA Nurse-CC: pt states breathing has unchnaged. pt c/o dry cough. pt has no complaints on her sleep. pt states she sleeps well.  Allergy, rhinitis doing well. Admits chronic dry cough. Left CPAP off for months. No longer notes daytime sleepiness. Nobody at home to report on her sleep/ snoring.     Preventive Screening-Counseling & Management  Alcohol-Tobacco     Smoking Status: never  Current Medications (verified): 1)  Fish Oil 1000 Mg  Caps (Omega-3 Fatty Acids) .... Take 1 By Mouth Once Daily 2)  Caltrate 600+d 600-400 Mg-Unit  Tabs (Calcium Carbonate-Vitamin D) .... Take 1 By Mouth Two Times A Day 3)  Magnesium Gluconate 500 Mg  Tabs (Magnesium Gluconate) .... 2-3 By Mouth Once Daily 4)  Lipitor 40 Mg Tabs (Atorvastatin Calcium) .... Take One Tablet By Mouth Daily. 5)  Arimidex 1 Mg  Tabs (Anastrozole) .... Take 1 By Mouth Once Daily 6)  Accupril 20 Mg Tabs (Quinapril Hcl) .... Take 1 By Mouth Once Daily 7)  Ativan 2 Mg  Tabs  (Lorazepam) .... Take 1 By Mouth Two Times A Day 8)  Cinnamon 500 Mg  Caps (Cinnamon) .... Take 2 By Mouth Once Daily 9)  Amitriptyline Hcl 50 Mg Tabs (Amitriptyline Hcl) .Marland Kitchen.. 1 For Sleep 10)  Plavix 75 Mg Tabs (Clopidogrel Bisulfate) .... Take One Tablet By Mouth Daily 11)  Nitrostat 0.4 Mg Subl (Nitroglycerin) .Marland Kitchen.. 1 Tablet Under Tongue At Onset of Chest Pain; You May Repeat Every 5 Minutes For Up To 3 Doses. 12)  Aspirin Ec 325 Mg Tbec (Aspirin) .... Take One Tablet By Mouth Daily 13)  Imdur 30 Mg Xr24h-Tab (Isosorbide Mononitrate) .Marland Kitchen.. 1 Once Daily  Allergies (verified): No Known Drug Allergies  Past History:  Past Medical History: Last updated: 08/11/2009 PERSONAL HISTORY OF MALIGNANT NEOPLASM OF BREAST (ICD-V10.3) SLEEP APNEA (ICD-780.57) HYPERLIPIDEMIA (ICD-272.4) HYPERTENSION (ICD-401.9) ANXIETY (ICD-300.00) INSOMNIA WITH SLEEP APNEA UNSPECIFIED (ICD-780.51) HEADACHE, CHRONIC (ICD-784.0) OSTEOARTHRITIS (ICD-715.9 panic attacks coronary artery disease  Past Surgical History: Last updated: Aug 28, 2008 Cholecystectomy Breast surgery Right mastectomy Tonsillectomy Kneee surgery  Family History: Last updated: 2008/08/28  Her mother died at age 5 and father died at age 27.   She does not know their history well and does not know what medical   problems they have had.  She has a brother who died of a brain tumor.      Social History: Last updated: 01/12/2010 The patient is divorced.  She has two children.  She is not employed.  She does not smoke cigarettes or drink alcohol.  participates in a modest amount of exercise.     Patient never smoked.   Risk Factors: Smoking Status: never (02/05/2010)  Review of Systems      See HPI       The patient complains of prolonged cough.  The patient denies anorexia, fever, weight loss, weight gain, vision loss, decreased hearing, hoarseness, chest pain, syncope, dyspnea on exertion, peripheral edema, headaches, hemoptysis,  abdominal pain, severe indigestion/heartburn, unusual weight change, abnormal bleeding, enlarged lymph nodes, and angioedema.    Vital Signs:  Patient profile:   75 year old female Height:      62 inches O2 Sat:      99 % on Room air Pulse rate:   99 / minute BP sitting:   128 / 72  (left arm) Cuff size:   large  Vitals Entered By: Carver Fila (February 05, 2010 3:13 PM)  O2 Flow:  Room air CC: pt states breathing has unchnaged. pt c/o dry cough. pt has no complaints on her sleep. pt states she sleeps well.  Comments meds and allergies updated Phone number updated Carver Fila  February 05, 2010 3:15 PM    Physical Exam  Additional Exam:  General: A/Ox3; pleasant and cooperative, NAD, obese, talkative, wheelchair SKIN: no rash, lesions, female pattern baldness NODES: no lymphadenopathy HEENT: [place]/AT, EOM- WNL, Conjuctivae- clear, PERRLA, TM-WNL, Nose- clear, Throat- clear and wnl NECK: Supple w/ fair ROM, JVD- none, normal carotid impulses w/o bruits Thyroid-  CHEST: Clear to P&A, right mastectomy, scars on chest HEART: RRR, no m/g/r heard ABDOMEN: overweight ZOX:WRUE, nl pulses, no edema  NEURO: Grossly intact to observation      Impression & Recommendations:  Problem # 1:  SLEEP APNEA (ICD-780.57) She questions if she really needs CPAP any more. We discussed this, distinguishing between lung function and sleep apnea. We will get an overnight oximetry. She has no body to report snoring or witnessed sleep issues from home.   Problem # 2:  DYSPNEA (ICD-786.05) She was only an occasional smoker and does not report productive cough, wheeze or chest pain. She is not needing bronchodilators now.  I don't think her hx of breast cancer is relevant, but will be xondiered if her symptoms change.   Other Orders: Est. Patient Level III (45409) DME Referral (DME)  Patient Instructions: 1)  Please schedule a follow-up appointment in 1 year. 2)  We will have [facility] an  overnight oximetry to see what is happening with your breathing at night.

## 2010-03-19 NOTE — Progress Notes (Signed)
       Additional Follow-up for Phone Call Additional follow up Details #2::    recived a refill request from pharmacy with a note saying the pt states shes on 20 mg of liptior.. I called Mrs. Zeleznik also looked in chart looks as if it was increased during hospital visit prior to this visit there is a lab document  that indicates lipitor being increased to 80mg ..Speaking with Mrs Xiong she states she wants Atorvastatin 20mg  due to cost. We advised pt that we can give her 2 of the 20mg  to equal 40mg  but pt refused to take it that way she states she will only take Atorvastatin 20mg .. I will fill as Atorvastatin 20mg  daily.[facility] pharmacy and gave pt refills Follow-up by: Kem Parkinson,  March 13, 2010 12:13 PM  +

## 2010-03-19 NOTE — Procedures (Signed)
Summary: Oximetry / [facility]  Oximetry / [facility]   Imported By: Lennie Odor 03/04/2010 15:58:59  _____________________________________________________________________  External Attachment:    Type:   Image     Comment:   External Document

## 2010-03-19 NOTE — Progress Notes (Signed)
Summary: PT WOULD LIKE A CALL  Phone Note Call from Patient Call back at Home Phone [phone]   Caller: Patient Reason for Call: Talk to Nurse, Talk to Doctor Summary of Call: PT FORGOT TO TELL YOU SOMETHING VERY IMPORTANT AND WANTS YOU CALL WHEN YOU HAVE TIME Initial call taken by: Omer Jack,  February 25, 2010 12:39 PM  Follow-up for Phone Call        [facility] Scherrie Bateman, LPN  February 25, 2010 1:12 PM PT CALLED WITH CONCERNS RE CHOLESTEROL MEDS BEING LINKED TO CAUSE DIABETES  IN PT'S. PT  NOTIFIED WILL DISCUSS WITH DR Anyae Griffith . VERBALIZED UNDERSTANDING.PER PT HAS BEEN ON LIPITOR FOR 6 YEARS. Follow-up by: Scherrie Bateman, LPN,  February 25, 2010 1:26 PM  Additional Follow-up for Phone Call Additional follow up Details #1::        Continue statin; pt with h/o CAD Ferman Hamming, MD, [employer]  February 25, 2010 1:42 PM  pt aware Deliah Goody, RN  February 25, 2010 4:15 PM

## 2010-03-19 NOTE — Progress Notes (Signed)
Summary: speak to nurse re: O2- pt call x 2  Phone Note Call from Patient Call back at Home Phone [phone]   Caller: Patient Call For: young Summary of Call: Pt states she wants to speak to [facility] in ref to a test she had last week. Initial call taken by: Darletta Moll,  February 23, 2010 3:33 PM  Follow-up for Phone Call        Spoke with pt.  She is requesting the results of ONO- done with [facility].  Pls advise, thanks! Follow-up by: Vernie Murders,  February 23, 2010 4:09 PM  Additional Follow-up for Phone Call Additional follow up Details #1::        I have not received this report yet. Additional Follow-up by: Waymon Budge MD,  February 24, 2010 9:00 AM    Additional Follow-up for Phone Call Additional follow up Details #2::    Will forward to [facility] to find results for CDY to review Vernie Murders  February 24, 2010 9:01 AM   Additional Follow-up for Phone Call Additional follow up Details #3:: Details for Additional Follow-up Action Taken: Please let her know that oxygen level dropped enough in sleep to strain her heart. I suggest we have her try oxygen at night for sleep, without CPAP. Please let me know if she agrees and I will order.   LMOMTCB Vernie Murders  February 25, 2010 10:37 AM  I spoke with pt and notified of the results/recs per CDY.  She is okay with o2 and wants this ordered through [facility].  She is curious as to why CDY prefers o2 over CPAP.  Also wants to know how low o2 dropped.  Pls advise thanks! .................................................................................................................... She had quit CPAP and turned it back in. At last visit I suggested we try just with oxygen to see how that does. LMOAM for pt to return my call. Rhonda Cobb  February 26, 2010 9:43 AM Pt understands and will start oxygen. Pt wants to know, since she is now starting o2 wants to know when you want to see her back? She feels like a year is to long to wait  since she is now on o2. Please advise. Rhonda Cobb  February 26, 2010 11:57 AM Per Dr. Maple Hudson f/u in 4-6 wks. Appt scheduled for Monday 04/06/10 at 2:15. Pt has been contacted about appt and is aware of date and time of this appt. Order has been faxed to [facility] to provide pt with oxygen at 2 lpm at night. Alfonso Ramus  February 26, 2010 12:16 PM  Additional Follow-up by: Vernie Murders,  February 25, 2010 10:56 AM  New/Updated Medications: * OXYGEN 2 L/M DURING SLEEP

## 2010-03-20 ENCOUNTER — Telehealth: Payer: Self-pay | Admitting: Cardiology

## 2010-04-02 NOTE — Progress Notes (Signed)
Summary: discuss plavix, no information was given  Phone Note Call from Patient Call back at [facility] Phone [phone]   Caller: Patient Reason for Call: Talk to Nurse Summary of Call: discuss plavix. no information was given will discuss with debra when she called. Initial call taken by: Lorne Skeens,  March 20, 2010 12:18 PM  Follow-up for Phone Call        Left message to call back Deliah Goody, RN  March 20, 2010 5:59 PM  spoke with pt, she reports bruising alot. pt instructed to decrease asa to 81mg  by mouth daily. she was cautioned about the use of ibuprofen with the plavix also. refills sent to the pharm Deliah Goody, RN  March 23, 2010 8:25 AM     New/Updated Medications: ASPIRIN 81 MG TBEC (ASPIRIN) Take one tablet by mouth daily Prescriptions: PLAVIX 75 MG TABS (CLOPIDOGREL BISULFATE) Take one tablet by mouth daily  #30 x 12   Entered by:   Deliah Goody, RN   Authorized by:   Ferman Hamming, MD, [facility]   Signed by:   Deliah Goody, RN on 03/23/2010   Method used:   Electronically to        [id number] (retail)       [address]       [place], [place]  [address]       Ph: [phone] or [phone]       Fax: [phone]   RxID:   [id number] PLAVIX 75 MG TABS (CLOPIDOGREL BISULFATE) Take one tablet by mouth daily  #30 x 12   Entered by:   Deliah Goody, RN   Authorized by:   Ferman Hamming, MD, [facility]   Signed by:   Deliah Goody, RN on 03/23/2010   Method used:   Electronically to        [id number] (retail)       [address]       [place], [place]  [address]       Ph: [phone] or [phone]       Fax: [phone]   RxID:   [id number] LIPITOR 40 MG TABS (ATORVASTATIN CALCIUM) Take one tablet by mouth daily.  #30 x 12   Entered by:   Deliah Goody, RN   Authorized by:   Ferman Hamming, MD, [facility]   Signed by:   Deliah Goody, RN on 03/23/2010   Method used:   Electronically to        [id number]  (retail)       [address]       [place], [place]  [address]       Ph: [phone] or [phone]       Fax: [phone]   RxID:   [id number] PLAVIX 75 MG TABS (CLOPIDOGREL BISULFATE) Take one tablet by mouth daily  #30 x 12   Entered by:   Deliah Goody, RN   Authorized by:   Ferman Hamming, MD, [facility]   Signed by:   Deliah Goody, RN on 03/23/2010   Method used:   Electronically to        [facility]* (retail)       [address] [place], [place]  [address]       Ph: [phone]       Fax: [phone]   RxID:   [id number]

## 2010-04-06 ENCOUNTER — Ambulatory Visit: Payer: Self-pay | Admitting: Internal Medicine

## 2010-04-15 ENCOUNTER — Telehealth: Payer: Self-pay | Admitting: Cardiology

## 2010-04-21 ENCOUNTER — Ambulatory Visit: Payer: Self-pay | Admitting: Internal Medicine

## 2010-04-23 NOTE — Progress Notes (Signed)
Summary: calling re angina attack last night  Phone Note Call from Patient   Caller: Patient [phone] Reason for Call: Talk to Nurse Summary of Call: pt calling re having another angina attack last night-call after 11a Initial call taken by: Glynda Jaeger,  April 15, 2010 8:35 AM  Follow-up for Phone Call        Left message to call back Deliah Goody, RN  April 15, 2010 2:06 PM Pt returning call Judie Grieve  April 15, 2010 2:13 PM pt calling back-she doesn't need a call now-she was stressed on tuesday and that night is when she had the angina attack, she took two nitro ,feels fine now and said she'll call if any more problems Glynda Jaeger  April 16, 2010 8:08 AM

## 2010-04-29 LAB — CARDIAC PANEL(CRET KIN+CKTOT+MB+TROPI)
CK, MB: 3.4 ng/mL (ref 0.3–4.0)
Relative Index: 2.6 — ABNORMAL HIGH (ref 0.0–2.5)
Total CK: 127 U/L (ref 7–177)
Troponin I: 0.01 ng/mL (ref 0.00–0.06)

## 2010-04-29 LAB — CBC
HCT: 38 % (ref 36.0–46.0)
Hemoglobin: 12.5 g/dL (ref 12.0–15.0)
MCH: 27.3 pg (ref 26.0–34.0)
MCH: 27.4 pg (ref 26.0–34.0)
MCHC: 32.4 g/dL (ref 30.0–36.0)
RBC: 4.57 MIL/uL (ref 3.87–5.11)
RDW: 16.1 % — ABNORMAL HIGH (ref 11.5–15.5)

## 2010-04-29 LAB — POCT I-STAT, CHEM 8
Calcium, Ion: 1.08 mmol/L — ABNORMAL LOW (ref 1.12–1.32)
Creatinine, Ser: 1.1 mg/dL (ref 0.4–1.2)
Glucose, Bld: 126 mg/dL — ABNORMAL HIGH (ref 70–99)
Hemoglobin: 15 g/dL (ref 12.0–15.0)
Potassium: 4 mEq/L (ref 3.5–5.1)

## 2010-04-29 LAB — COMPREHENSIVE METABOLIC PANEL
ALT: 55 U/L — ABNORMAL HIGH (ref 0–35)
AST: 34 U/L (ref 0–37)
Calcium: 8.6 mg/dL (ref 8.4–10.5)
GFR calc Af Amer: >60 mL/min (ref >60)
Sodium: 138 mEq/L (ref 135–145)
Total Protein: 5.9 g/dL — ABNORMAL LOW (ref 6.0–8.3)

## 2010-04-29 LAB — POCT CARDIAC MARKERS
CKMB, poc: <1 ng/mL — ABNORMAL LOW (ref 1.0–8.0)
Troponin i, poc: <0.05 ng/mL (ref 0.00–0.09)

## 2010-04-29 LAB — DIFFERENTIAL
Eosinophils Absolute: 0.3 10*3/uL (ref 0.0–0.7)
Eosinophils Relative: 2 % (ref 0–5)
Lymphs Abs: 3.3 10*3/uL (ref 0.7–4.0)
Monocytes Relative: 8 % (ref 3–12)

## 2010-04-29 LAB — CK TOTAL AND CKMB (NOT AT ARMC)
Relative Index: INVALID (ref 0.0–2.5)
Total CK: 32 U/L (ref 7–177)

## 2010-04-29 LAB — HEPARIN LEVEL (UNFRACTIONATED): Heparin Unfractionated: <0.1 IU/mL — ABNORMAL LOW (ref 0.30–0.70)

## 2010-04-29 LAB — BASIC METABOLIC PANEL
BUN: 16 mg/dL (ref 6–23)
GFR calc non Af Amer: >60 mL/min (ref >60)
Glucose, Bld: 113 mg/dL — ABNORMAL HIGH (ref 70–99)
Potassium: 4.5 mEq/L (ref 3.5–5.1)

## 2010-04-29 LAB — LIPID PANEL
Cholesterol: 152 mg/dL (ref 0–200)
HDL: 50 mg/dL (ref >39)
Triglycerides: 60 mg/dL (ref <150)

## 2010-04-29 LAB — TSH: TSH: 3.099 u[IU]/mL (ref 0.350–4.500)

## 2010-05-01 ENCOUNTER — Encounter: Payer: Self-pay | Admitting: Internal Medicine

## 2010-05-02 ENCOUNTER — Telehealth: Payer: Self-pay | Admitting: *Deleted

## 2010-05-04 LAB — BASIC METABOLIC PANEL
CO2: 26 mEq/L (ref 19–32)
Chloride: 107 mEq/L (ref 96–112)
GFR calc Af Amer: >60 mL/min (ref >60)
Potassium: 4.1 mEq/L (ref 3.5–5.1)
Sodium: 138 mEq/L (ref 135–145)

## 2010-05-04 LAB — POCT I-STAT, CHEM 8
Chloride: 106 mEq/L (ref 96–112)
Creatinine, Ser: 0.7 mg/dL (ref 0.4–1.2)
Glucose, Bld: 100 mg/dL — ABNORMAL HIGH (ref 70–99)
Hemoglobin: 14.3 g/dL (ref 12.0–15.0)
Potassium: 4.2 mEq/L (ref 3.5–5.1)

## 2010-05-04 LAB — CK TOTAL AND CKMB (NOT AT ARMC)
CK, MB: 1.3 ng/mL (ref 0.3–4.0)
Relative Index: INVALID (ref 0.0–2.5)
Total CK: 39 U/L (ref 7–177)

## 2010-05-04 LAB — DIFFERENTIAL
Basophils Relative: 1 % (ref 0–1)
Eosinophils Absolute: 0.2 10*3/uL (ref 0.0–0.7)
Eosinophils Relative: 2 % (ref 0–5)
Lymphs Abs: 1.9 10*3/uL (ref 0.7–4.0)
Monocytes Absolute: 0.7 10*3/uL (ref 0.1–1.0)
Monocytes Relative: 7 % (ref 3–12)
Neutrophils Relative %: 70 % (ref 43–77)

## 2010-05-04 LAB — CBC
Hemoglobin: 12.6 g/dL (ref 12.0–15.0)
Hemoglobin: 13.9 g/dL (ref 12.0–15.0)
MCHC: 33.6 g/dL (ref 30.0–36.0)
MCHC: 34.1 g/dL (ref 30.0–36.0)
MCV: 84.4 fL (ref 78.0–100.0)
Platelets: 314 10*3/uL (ref 150–400)
RBC: 4.38 MIL/uL (ref 3.87–5.11)
RDW: 15.1 % (ref 11.5–15.5)

## 2010-05-04 LAB — COMPREHENSIVE METABOLIC PANEL
ALT: 26 U/L (ref 0–35)
AST: 26 U/L (ref 0–37)
Albumin: 3.8 g/dL (ref 3.5–5.2)
Alkaline Phosphatase: 172 U/L — ABNORMAL HIGH (ref 39–117)
Calcium: 9.3 mg/dL (ref 8.4–10.5)
GFR calc Af Amer: >60 mL/min (ref >60)
Potassium: 4.2 mEq/L (ref 3.5–5.1)
Sodium: 140 mEq/L (ref 135–145)
Total Protein: 6.9 g/dL (ref 6.0–8.3)

## 2010-05-04 LAB — POCT CARDIAC MARKERS
CKMB, poc: <1 ng/mL — ABNORMAL LOW (ref 1.0–8.0)
Troponin i, poc: <0.05 ng/mL (ref 0.00–0.09)

## 2010-05-04 LAB — LIPID PANEL
Total CHOL/HDL Ratio: 4.2 RATIO
VLDL: 27 mg/dL (ref 0–40)

## 2010-05-04 LAB — PROTIME-INR: INR: 0.98 (ref 0.00–1.49)

## 2010-05-05 NOTE — Progress Notes (Addendum)
Summary: Cardiology Phone Note  Phone Note Call from Patient   Caller: Patient Summary of Call: Pt called to report she had an episode of chest pain of this evening, relieved by 2 sublingual nitroglycerin. She was reading in a book that angina can be a "precursor to heart disease" and is very concerned. She also has some anxiety regarding her arthritis as well as being on lipitor. She states she had a nuc in 11/11 which was normal but is worried that she was told it was only 80% accurate. She would like a f/u appt this upcoming week (can be with PA) which may be helpful to discuss upping anti-anginals vs alternative w/u. Advised pt to proceed to ER if CP recurs - she expressed understanding. Initial call taken by: Ronie Spies PA-C

## 2010-05-11 ENCOUNTER — Encounter: Payer: Self-pay | Admitting: Internal Medicine

## 2010-05-11 ENCOUNTER — Ambulatory Visit (INDEPENDENT_AMBULATORY_CARE_PROVIDER_SITE_OTHER): Payer: MEDICARE | Admitting: Internal Medicine

## 2010-05-11 VITALS — BP 120/70 | HR 103 | Ht 63.0 in

## 2010-05-11 DIAGNOSIS — J309 Allergic rhinitis, unspecified: Secondary | ICD-10-CM

## 2010-05-11 DIAGNOSIS — G4730 Sleep apnea, unspecified: Secondary | ICD-10-CM

## 2010-05-11 NOTE — Progress Notes (Signed)
Subjective:    Patient ID: [name], female    DOB: [birth year], 75 y.o.   MRN: [id number]  HPI 47 yoF with hx OSA. She had dropped off CPAP around 2010 and quit using it. . Never smoked. Followed here for Allergic rhintis, dyspnea. Last here 02/05/10. She had stopped CPAP months prior- just too hard to deal with it. She was using oxygen during sleep.   Now she  says stress at home has made it hard for her to cope with O2, so she has not used that either. She denies feeling short of breath, tight or wheezey, chest pain or palpitation. Saw Dr [name] in February. Rare mild angina. Hx CAD/ stent 06/2009, no MI.  Uses walker at home and mostly sits. Now she is concerned about status and wants reassesment. She doesn't notice sleep breathing discomfort and sleeps alone, with no report of snore or apnea available.    Review of Systems Constitutional:   No weight loss, night sweats,  Fevers, chills, fatigue, lassitude. HEENT:     Difficulty swallowing,  Tooth/dental problems,  Sore throat,                No sneezing, itching, ear ache, nasal congestion, post nasal drip,   CV:  Rare chest pain,  Orthopnea, PND, , anasarca, dizziness, palpitations. Admits chronic pedal edema as she sits all day.   GI  No heartburn, indigestion, abdominal pain, nausea, vomiting, diarrhea, change in bowel habits, loss of appetite  Resp: Short of breath with limited exertion. .  No excess mucus, no productive cough,  No non-productive cough,  No coughing up of blood.  No change in color of mucus.  No wheezing.  No chest wall deformity  Skin: no rash or lesions.  GU: no dysuria, change in color of urine, no urgency or frequency.  No flank pain.  MS:  No joint pain or swelling.  No decreased range of motion.  No back pain.  Psych:  No change in mood or affect. Frequent depression and anxiety- sees Dr Evelene Croon.  No memory loss.     Objective:   Physical Exam    General- Alert, Oriented, Affect-appropriate, Distress-  none acute, obese, talkative in wheelchair, seems quite intelligent, eager to discuss merits of political candidates.   Skin- rash-none, lesions- none, excoriation- none. Fungal nail changes on toes  Lymphadenopathy- none  Head- atraumatic  Eyes- Gross vision intact, PERRLA, conjunctivae clear, secretions  Ears- Normal- Hearing, canals, Tm L ,   R ,  Nose- Clear, Septal dev, mucus, polyps, erosion, perforation   Throat- Mallampati III , mucosa clear , drainage- none, tonsils- atrophic  Neck- flexible , trachea midline, no stridor , thyroid nl, carotid no bruit  Chest - symmetrical excursion , unlabored and talkative. Room air sat 92% on room air     Heart/CV- RRR , no murmur , no gallop  , no rub, nl s1 s2                     - JVD- none , Does have 2+ bilateral ankle edema;  stasis changes- none, varices- none     Lung- clear to P&A, wheeze- none, cough- none , dullness-none, rub- none     Chest wall- right mastectomy  Abd- tender-no, distended-no, bowel sounds-present, HSM- no  Br/ Gen/ Rectal- Not done, not indicated  Extrem- cyanosis- none, clubbing, none, atrophy- none, strength- nl  Neuro- grossly intact to observation    Assessment &  Plan:

## 2010-05-11 NOTE — Patient Instructions (Signed)
Order- [facility] to schedule  Split protocol NPSG for dx obstructive sleep apnea.

## 2010-05-11 NOTE — Assessment & Plan Note (Addendum)
We discussed options and will update a sleep study. The first step in dealing with noncompliance is to reconfirm what her current sleep breathing status is.

## 2010-05-11 NOTE — Assessment & Plan Note (Signed)
She has complained of nasal congestion and drip with seasonal pollen peaks, but denies active discomfort now.

## 2010-05-12 ENCOUNTER — Telehealth: Payer: Self-pay | Admitting: Cardiology

## 2010-05-12 NOTE — Telephone Encounter (Signed)
Spoke with pt, she was seen by dr young, her oxygen sat was 88%. Dr young is going to repeat a sleep study. She is going to try to start using her oxygen again at night. All her questions were answered.

## 2010-05-13 ENCOUNTER — Emergency Department (HOSPITAL_COMMUNITY): Payer: Medicare Other

## 2010-05-13 ENCOUNTER — Telehealth: Payer: Self-pay | Admitting: Physician Assistant

## 2010-05-13 ENCOUNTER — Observation Stay (HOSPITAL_COMMUNITY)
Admission: EM | Admit: 2010-05-13 | Discharge: 2010-05-14 | DRG: 313 | Disposition: A | Discharge status: Home / Self Care | Payer: Medicare Other | Attending: Cardiology | Admitting: Cardiology

## 2010-05-13 DIAGNOSIS — I2510 Atherosclerotic heart disease of native coronary artery without angina pectoris: Secondary | ICD-10-CM | POA: Insufficient documentation

## 2010-05-13 DIAGNOSIS — F411 Generalized anxiety disorder: Secondary | ICD-10-CM | POA: Insufficient documentation

## 2010-05-13 DIAGNOSIS — Z9861 Coronary angioplasty status: Secondary | ICD-10-CM | POA: Insufficient documentation

## 2010-05-13 DIAGNOSIS — R079 Chest pain, unspecified: Principal | ICD-10-CM | POA: Insufficient documentation

## 2010-05-13 DIAGNOSIS — R7309 Other abnormal glucose: Secondary | ICD-10-CM | POA: Insufficient documentation

## 2010-05-13 DIAGNOSIS — I10 Essential (primary) hypertension: Secondary | ICD-10-CM | POA: Insufficient documentation

## 2010-05-13 DIAGNOSIS — Z9010 Acquired absence of unspecified breast and nipple: Secondary | ICD-10-CM | POA: Insufficient documentation

## 2010-05-13 DIAGNOSIS — Z79899 Other long term (current) drug therapy: Secondary | ICD-10-CM | POA: Insufficient documentation

## 2010-05-13 DIAGNOSIS — M1990 Unspecified osteoarthritis, unspecified site: Secondary | ICD-10-CM | POA: Insufficient documentation

## 2010-05-13 DIAGNOSIS — Z853 Personal history of malignant neoplasm of breast: Secondary | ICD-10-CM | POA: Insufficient documentation

## 2010-05-13 DIAGNOSIS — E785 Hyperlipidemia, unspecified: Secondary | ICD-10-CM | POA: Insufficient documentation

## 2010-05-13 DIAGNOSIS — G4730 Sleep apnea, unspecified: Secondary | ICD-10-CM | POA: Insufficient documentation

## 2010-05-13 DIAGNOSIS — F410 Panic disorder [episodic paroxysmal anxiety] without agoraphobia: Secondary | ICD-10-CM | POA: Insufficient documentation

## 2010-05-13 LAB — CBC
HCT: 39.8 % (ref 36.0–46.0)
RDW: 15.8 % — ABNORMAL HIGH (ref 11.5–15.5)
WBC: 10.8 10*3/uL — ABNORMAL HIGH (ref 4.0–10.5)

## 2010-05-13 LAB — DIFFERENTIAL
Basophils Absolute: 0 10*3/uL (ref 0.0–0.1)
Basophils Relative: 0 % (ref 0–1)
Eosinophils Relative: 4 % (ref 0–5)
Lymphocytes Relative: 24 % (ref 12–46)
Neutro Abs: 7.1 10*3/uL (ref 1.7–7.7)

## 2010-05-13 LAB — PROTIME-INR
INR: 0.93 (ref 0.00–1.49)
Prothrombin Time: 12.7 seconds (ref 11.6–15.2)

## 2010-05-13 NOTE — Telephone Encounter (Signed)
Pt called tonight to state she had an "angina attack" that went from her back radiating into her jawbone, somewhat worse than prior attacks, lasting much longer than usual. It was relieved by 4 NTG. Advised pt to proceed to the ER for further eval but she refused. States pain is now gone and only wanted to notify Dr. [name] about it. Advised pt to return to ER if pain recurs and she seemed ambivalent but agreeable.

## 2010-05-14 DIAGNOSIS — R079 Chest pain, unspecified: Secondary | ICD-10-CM

## 2010-05-14 LAB — CBC
MCH: 27.8 pg (ref 26.0–34.0)
MCHC: 33.7 g/dL (ref 30.0–36.0)
MCV: 82.5 fL (ref 78.0–100.0)
Platelets: 317 10*3/uL (ref 150–400)
RDW: 15.7 % — ABNORMAL HIGH (ref 11.5–15.5)

## 2010-05-14 LAB — TROPONIN I: Troponin I: 0.02 ng/mL (ref 0.00–0.06)

## 2010-05-14 LAB — BASIC METABOLIC PANEL
BUN: 17 mg/dL (ref 6–23)
BUN: 15 mg/dL (ref 6–23)
CO2: 23 mEq/L (ref 19–32)
Calcium: 8.9 mg/dL (ref 8.4–10.5)
Chloride: 107 mEq/L (ref 96–112)
Creatinine, Ser: 0.98 mg/dL (ref 0.4–1.2)
Creatinine, Ser: 0.83 mg/dL (ref 0.4–1.2)
GFR calc Af Amer: >60 mL/min (ref >60)
Glucose, Bld: 135 mg/dL — ABNORMAL HIGH (ref 70–99)

## 2010-05-14 LAB — CARDIAC PANEL(CRET KIN+CKTOT+MB+TROPI)
CK, MB: 1.4 ng/mL (ref 0.3–4.0)
Relative Index: INVALID (ref 0.0–2.5)
Troponin I: 0.02 ng/mL (ref 0.00–0.06)
Troponin I: 0.01 ng/mL (ref 0.00–0.06)

## 2010-05-14 LAB — POCT CARDIAC MARKERS: Troponin i, poc: <0.05 ng/mL (ref 0.00–0.09)

## 2010-05-14 LAB — LIPID PANEL
Cholesterol: 130 mg/dL (ref 0–200)
HDL: 36 mg/dL — ABNORMAL LOW (ref >39)
LDL Cholesterol: 67 mg/dL (ref 0–99)
Total CHOL/HDL Ratio: 3.6 RATIO
Triglycerides: 134 mg/dL (ref <150)

## 2010-05-14 LAB — HEMOGLOBIN A1C
Hgb A1c MFr Bld: 6 % — ABNORMAL HIGH (ref <5.7)
Mean Plasma Glucose: 126 mg/dL — ABNORMAL HIGH (ref <117)

## 2010-05-14 LAB — CK TOTAL AND CKMB (NOT AT ARMC): Relative Index: INVALID (ref 0.0–2.5)

## 2010-05-16 ENCOUNTER — Telehealth: Payer: Self-pay | Admitting: Physician Assistant

## 2010-05-16 NOTE — Telephone Encounter (Signed)
Patient called with "angina attack."  She was just d/c from the hospital 2 days ago.  She presented with chest pain, r/o for MI and no further w/u was recommended.  She has a h/o CAD, s/p DES to pLAD in 06/2009 and nonischemic myoview in 12/2009.  She developed severe back pain with radiation to her jaw similar to previous episodes of angina.  She has taken 3 NTG without relief.  She denies dyspnea, nausea, diaphoresis or syncope.  I have advised her to call 911 to go to the emergency room.  She agreed.

## 2010-05-18 ENCOUNTER — Telehealth: Payer: Self-pay | Admitting: Cardiology

## 2010-05-18 NOTE — Discharge Summary (Addendum)
[name], [name]                 ACCOUNT NO.:  [id number]  MEDICAL RECORD NO.:  [id number]           PATIENT TYPE:  I  LOCATION:  2024                         FACILITY:  [facility]  PHYSICIAN:  [name], MD, FACCDATE OF BIRTH:  04/20/1935  DATE OF ADMISSION:  05/13/2010 DATE OF DISCHARGE:  05/14/2010                              DISCHARGE SUMMARY   DISCHARGE DIAGNOSES: 1. Chest pain, felt atypical, EKG without acute changes and cardiac     enzymes negative x3. 2. Anxiety/panic disorder. 3. Dyslipidemia. 4. Coronary artery disease, status post drug-eluting stent placed to     the proximal left anterior descending in May 2011 with normal     circumflex, normal right coronary artery, and ejection fraction was     50% at that time. 5. Hypertension. 6. Osteoarthritis. 7. Sleep apnea. 8. Breast cancer, status post right mastectomy, with chemotherapy. 9. Status post knee arthroscopy.  HOSPITAL COURSE:  [name] is a 75 year old female with past medical history of CAD, hypertension, hyperlipidemia, and anxiety disorder who gets occasional chest pain and take p.r.n. nitroglycerin.  Typically it goes away with nitroglycerin.  She was called into the office after hours to plan for this.  On the day of admission, she developed chest discomfort at rest, which she described more as a jaw discomfort as well as back discomfort.  She took nitroglycerin x4 at home without relief and it eventually did do away.  There were no associated symptoms such as nausea, vomiting, or shortness of breath.  She has had no palpitations, syncope, or presyncope.  She presented to the ER and point- of-care markers were negative.  EKG was unremarkable.  Cardiology was asked to admit the patient for further evaluation.  There was no objective evidence of ischemia, and the patient was admitted for rule out.  Cardiac enzymes remained negative x2.  Her symptoms remained stable.  Chest x-ray was also nonacute.   Dr. [name] feels that her pain is more so atypical, and she has had a recent negative Myoview in November 2011.  He feels that she can be safely discharged today with a planned followup in May as scheduled with current medications.  DISCHARGE LABORATORY DATA:  WBC 8.2, hemoglobin 12.4, hematocrit 36.8, and platelet count 317.  Sodium 139, potassium 4.0, chloride 106, CO2 21, glucose 111, BUN 15, and creatinine 0.83.  Cardiac enzymes negative x3.  Total cholesterol 132, triglycerides 134, HDL 36, and LDL 67.  STUDIES:  Chest x-ray on May 12, 2010, showed enlargement of the cardiac silhouette.  Emphysematous changes.  Minimal bibasilar atelectasis.  DISCHARGE MEDICATIONS: 1. Acetaminophen 325 mg 2 tabs q.4 h. p.r.n. pain. 2. Amitriptyline 50 mg 2 tablets daily at bedtime. 3. Arimidex 1 mg 1 tablet q.a.m. 4. Aspirin 81 mg every morning. 5. Calcium citrate/vitamin D two tablets every morning. 6. Cinnamon 500 mg 3 tabs every morning. 7. Fish oil 1000 mg every morning. 8. Lipitor 40 mg every morning. 9. Lorazepam 2 mg b.i.d. p.r.n. for anxiety. 10.Nitroglycerin sublingual 0.4 mg every 5 minutes as needed up to 3     doses for chest pain.  11.Plavix 75 mg daily. 12.Lisinopril 20 mg every morning. 13.Systane eye drops 1 drop both eyes daily as needed for dry eyes.  DISPOSITION:  [name] will be discharged in stable condition to home. She is instructed to increase activity slowly and to follow a low- sodium, heart-healthy diet.  She will follow up with Dr. [name] as scheduled on Jun 19, 2010, at 10:00 a.m.  In the meantime, she is to call or return if she develops any recurrent symptoms including chest pain, shortness of breath, dizziness, sweating, or any other symptoms concerning to her.  DURATION OF DISCHARGE ENCOUNTER:  Greater than 30 minutes including the physician and PA time.     Dayna Dunn, P.A.C.   ______________________________ [name], MD,  [facility]    DD/MEDQ  D:  05/14/2010  T:  05/15/2010  Job:  [id number]  cc:   Larina Earthly, M.D.  Electronically Signed by Olga Millers MD [facility] on 05/18/2010 07:54:22 PM Electronically Signed by Ronie Spies  on 05/20/2010 02:32:10 PM

## 2010-05-18 NOTE — Telephone Encounter (Signed)
Aware,did not call pt

## 2010-05-18 NOTE — Telephone Encounter (Signed)
Called to tell you about her breast cancer news. 9 yrs & 

## 2010-05-20 ENCOUNTER — Encounter (HOSPITAL_BASED_OUTPATIENT_CLINIC_OR_DEPARTMENT_OTHER): Payer: MEDICARE

## 2010-05-20 ENCOUNTER — Telehealth: Payer: Self-pay | Admitting: Cardiology

## 2010-05-20 NOTE — Telephone Encounter (Signed)
Faxed All Cardiac over to [facility] @ [phone] 05/20/10/KM

## 2010-05-21 LAB — URINALYSIS, ROUTINE W REFLEX MICROSCOPIC
Glucose, UA: NEGATIVE mg/dL
Protein, ur: NEGATIVE mg/dL
Specific Gravity, Urine: 1.008 (ref 1.005–1.030)
pH: 6 (ref 5.0–8.0)

## 2010-05-21 LAB — CBC
HCT: 41.6 % (ref 36.0–46.0)
Platelets: 254 10*3/uL (ref 150–400)
RBC: 4.92 MIL/uL (ref 3.87–5.11)
WBC: 8.4 10*3/uL (ref 4.0–10.5)

## 2010-05-21 LAB — DIFFERENTIAL
Basophils Absolute: 0 10*3/uL (ref 0.0–0.1)
Basophils Relative: 0 % (ref 0–1)
Eosinophils Relative: 2 % (ref 0–5)
Monocytes Absolute: 0.8 10*3/uL (ref 0.1–1.0)
Neutro Abs: 5.5 10*3/uL (ref 1.7–7.7)

## 2010-05-21 LAB — POCT CARDIAC MARKERS
CKMB, poc: <1 ng/mL — ABNORMAL LOW (ref 1.0–8.0)
Myoglobin, poc: 84.2 ng/mL (ref 12–200)

## 2010-05-21 LAB — POCT I-STAT 3, ART BLOOD GAS (G3+)
pCO2 arterial: 33.6 mmHg — ABNORMAL LOW (ref 35.0–45.0)
pH, Arterial: 7.455 — ABNORMAL HIGH (ref 7.350–7.400)

## 2010-05-21 LAB — COMPREHENSIVE METABOLIC PANEL
AST: 25 U/L (ref 0–37)
Albumin: 3.7 g/dL (ref 3.5–5.2)
Alkaline Phosphatase: 175 U/L — ABNORMAL HIGH (ref 39–117)
BUN: 9 mg/dL (ref 6–23)
CO2: 27 mEq/L (ref 19–32)
Chloride: 105 mEq/L (ref 96–112)
Potassium: 4.6 mEq/L (ref 3.5–5.1)
Total Bilirubin: 0.7 mg/dL (ref 0.3–1.2)

## 2010-05-21 LAB — CK TOTAL AND CKMB (NOT AT ARMC)
CK, MB: 1.4 ng/mL (ref 0.3–4.0)
Total CK: 26 U/L (ref 7–177)

## 2010-05-21 LAB — CARDIAC PANEL(CRET KIN+CKTOT+MB+TROPI)
CK, MB: 1.1 ng/mL (ref 0.3–4.0)
Relative Index: INVALID (ref 0.0–2.5)
Troponin I: 0.02 ng/mL (ref 0.00–0.06)

## 2010-05-21 LAB — BASIC METABOLIC PANEL
BUN: 14 mg/dL (ref 6–23)
Chloride: 105 mEq/L (ref 96–112)
GFR calc non Af Amer: >60 mL/min (ref >60)
Potassium: 4.1 mEq/L (ref 3.5–5.1)
Sodium: 137 mEq/L (ref 135–145)

## 2010-05-25 ENCOUNTER — Telehealth: Payer: Self-pay | Admitting: Cardiology

## 2010-05-25 NOTE — Telephone Encounter (Signed)
Would prefer she stay on it but if cannot, dc at end of may and continue aspirin.

## 2010-05-25 NOTE — Telephone Encounter (Signed)
Spoke w/pt she states she is unable to cont. To afford Plavix, it is $45 a month and she just doesn't have it, left a few samples at front desk for her, she states Dr Juanda Chance told her she would only have to be on it for a year and that will be up in May will check w/Dr [name] to see when pt can stop Plavix

## 2010-05-26 NOTE — Telephone Encounter (Signed)
Spoke with pt, she will take the samples and then will try to get. If she can not afford she will cont on the asa. [name]

## 2010-05-28 LAB — CREATININE, SERUM
Creatinine, Ser: 0.81 mg/dL (ref 0.4–1.2)
GFR calc non Af Amer: >60 mL/min

## 2010-06-01 ENCOUNTER — Telehealth: Payer: Self-pay | Admitting: Cardiology

## 2010-06-01 LAB — CREATININE, SERUM
Creatinine, Ser: 0.87 mg/dL (ref 0.4–1.2)
GFR calc Af Amer: >60 mL/min (ref >60)
GFR calc non Af Amer: >60 mL/min (ref >60)

## 2010-06-01 NOTE — Telephone Encounter (Signed)
Pt calling re angina attacks

## 2010-06-08 NOTE — Telephone Encounter (Signed)
Left message for pt that she has appt 06-19-10 and per dr [name] will schedule more testing when she comes for that appt. She is to call me back if needed Deliah Goody

## 2010-06-09 ENCOUNTER — Telehealth: Payer: Self-pay | Admitting: Cardiology

## 2010-06-09 NOTE — Telephone Encounter (Signed)
Pt calling to let you know she hasn't called because she hasn't had an angina attack for 3 wks and 3 days and she went to [employer] to have blood work, her sugar was 91, chlolesterol was 142, and bp was 141/70. She will keep appt 5-4.12.

## 2010-06-12 ENCOUNTER — Other Ambulatory Visit: Payer: Self-pay | Admitting: Gastroenterology

## 2010-06-12 NOTE — H&P (Signed)
[name], [name]                 ACCOUNT NO.:  [id number]  MEDICAL RECORD NO.:  [id number]          PATIENT TYPE:  INP  LOCATION:                               FACILITY:  [facility]  PHYSICIAN:  Rollene Rotunda, MD, FACCDATE OF BIRTH:  1935/12/25  DATE OF ADMISSION:  05/14/2010 DATE OF DISCHARGE:                             HISTORY & PHYSICAL   PRIMARY:  Larina Earthly, M.D.  CARDIOLOGIST:  [name], MD, [facility].  REASON FOR PRESENTATION:  The patient with chest pain.  HISTORY OF PRESENT ILLNESS:  The patient is a lovely 75 year old white female with a past history of coronary stenting as described below. Last admission was in the fall of last year for 2 days.  She had chest discomfort but ruled out.  There was no objective evidence of ischemia and no further testing was suggested.  She had occasional chest pain and takes rare nitroglycerin.  Typically it goes away quickly with nitroglycerin.  However, today she developed this chest discomfort at rest.  It is really a jaw discomfort and a back discomfort.  She took nitroglycerin x4 at home without relief.  It did eventually go away.  It was 5/10 in intensity.  There were no associated symptoms such as nausea, vomiting, or shortness of breath.  She has had no palpitations, presyncope, or syncope.  She gets around limited with a walker.  This is limited by joint pains.  She has been doing this without significant difficulty more than usual recently.  She did present to the emergency room were her point-of-care cardiac markers were negative.  EKG was unremarkable.  She is currently pain free.  PAST MEDICAL HISTORY:  Coronary artery disease (catheterization May, 2011 80% proximal LAD stenosis, normal circumflex, normal right coronary artery.  EF 60%.  Promus drug-eluting stent), dyslipidemia, hypertension, osteoarthritis, sleep apnea, anxiety/panic, breast cancer.  PAST SURGICAL HISTORY:  Right mastectomy (the patient also  had chemotherapy 2003), knee arthroscopy.  ALLERGIES:  NONE.  MEDICATIONS: 1. Cysteine. 2. Quinapril 20 mg daily. 3. Plavix 75 mg daily. 4. Nitroglycerin p.r.n. 5. Lorazepam 2 mg b.i.d. 6. Lipitor 40 mg daily. 7. Fish oil. 8. Cinnamon. 9. Calcium. 10.Aspirin 81 mg daily. 11.Arimidex 1 mg q.a.m. 12.Amitriptyline 50 mg at bedtime. 13.Acetaminophen as needed.  SOCIAL HISTORY:  Patient is divorced.  She smoked some time ago, but does not smoke currently.  She does have a caregiver who helps her.  She has a brother.  She has two children.  FAMILY HISTORY:  Unknown to the patient.  She does not know her parent's cause of death.  REVIEW OF SYSTEMS:  As stated in the HPI, positive for recent indigestion, constipation, mild lower extremity swelling.  Otherwise negative for all other systems.  PHYSICAL EXAMINATION:  GENERAL:  The patient is quite pleasant and bright and in no distress. VITAL SIGNS:  Blood pressure 92/53, heart rate 99 regular, respiratory rate 16, afebrile. HEENT:  Eyes unremarkable, pupils equal, round, and reactive to light, fundi not visualized. NECK:  No jugular distention at 45 degrees, carotid upstroke brisk and symmetric, no bruits, no thyromegaly. LYMPHATICS:  No  cervical or inguinal adenopathy. LUNGS:  Clear to auscultation bilaterally. BACK:  Lordosis, but no costovertebral angle tenderness. CHEST:  Unremarkable. HEART:  PMI not displaced or sustained, S1 and S2 within normal limits. No S3, S4.  No clicks, rubs, no murmurs. ABDOMEN: Obese, positive bowel sounds normal frequency and pitch.  No bruits, rebound or guarding or midline pulsatile mass, no hepatomegaly, no splenomegaly. SKIN:  Keloids, no rashes, no nodules. EXTREMITIES:  2+ pulses, mild bilateral ankle edema. NEURO:  Oriented to person, place, time.  Cranial nerves II-XII grossly intact.  Motor grossly intact.  LABS:  Troponin and CK-MB x1 negative.  Sodium 138, potassium 3.8, BUN 17,  creatinine 0.98.  WBC 10.8, hemoglobin 13.2, platelets 323.  Chest x-ray emphysema and atelectasis.  EKG sinus tachycardia, low voltage in chest leads, no acute ST-T wave changes.  ASSESSMENT AND PLAN: 1. Chest pain.  The patient's chest discomfort is somewhat atypical.     Right now there is no objective evidence of ischemia.  I will cycle     cardiac enzymes and repeat an EKG in the morning.  Provided these     are negative and she has no further chest pain, I would not suggest     further inpatient study to be indicated.  She can continue with     risk reduction. 2. Hyperglycemia.  The patient has had borderline blood sugars in the     past.  I will check the hemoglobin A1c. 3. Dyslipidemia.  She will get a fasting lipid profile.  The goal     should be an LDL less than 100, HDL greater than 40. 4. Hypertension.  She will continue the meds as listed. 5. Coronary artery disease.  I will continue her Plavix.  For the     short term I will give her full dose aspirin, though she can go     back to 81 when she is discharged. 6. Anxiety, panic.  She will have her usual p.r.n. medicines.     Rollene Rotunda, MD, FACCJH/MEDQ  D:  05/14/2010  T:  05/14/2010  Job:  [id number]  Electronically Signed by Rollene Rotunda MD [facility] on 06/12/2010 11:45:12 AM

## 2010-06-15 ENCOUNTER — Encounter: Payer: Self-pay | Admitting: Cardiology

## 2010-06-15 ENCOUNTER — Ambulatory Visit (HOSPITAL_BASED_OUTPATIENT_CLINIC_OR_DEPARTMENT_OTHER): Payer: Medicare Other | Attending: Internal Medicine

## 2010-06-15 DIAGNOSIS — G4733 Obstructive sleep apnea (adult) (pediatric): Secondary | ICD-10-CM | POA: Insufficient documentation

## 2010-06-17 ENCOUNTER — Other Ambulatory Visit (HOSPITAL_COMMUNITY): Payer: Self-pay | Admitting: Surgery

## 2010-06-17 DIAGNOSIS — C50919 Malignant neoplasm of unspecified site of unspecified female breast: Secondary | ICD-10-CM

## 2010-06-18 ENCOUNTER — Telehealth: Payer: Self-pay | Admitting: Cardiology

## 2010-06-18 NOTE — Telephone Encounter (Signed)
Pt wants to know if you have any samples of plavix

## 2010-06-18 NOTE — Telephone Encounter (Signed)
plavix samples placed at the front desk for pick up. Pt aware [name]

## 2010-06-19 ENCOUNTER — Ambulatory Visit: Payer: Self-pay | Admitting: Cardiology

## 2010-06-19 ENCOUNTER — Encounter: Payer: Self-pay | Admitting: Internal Medicine

## 2010-06-20 DIAGNOSIS — G4710 Hypersomnia, unspecified: Secondary | ICD-10-CM

## 2010-06-20 DIAGNOSIS — G4730 Sleep apnea, unspecified: Secondary | ICD-10-CM

## 2010-06-21 NOTE — Procedures (Signed)
NAME:  [name], [name]                 ACCOUNT NO.:  [id number]  MEDICAL RECORD NO.:  [id number]          PATIENT TYPE:  OUT  LOCATION:  SLEEP CENTER                 FACILITY:  [facility]  PHYSICIAN:  Jonnette Nuon D. Maple Hudson, MD, FCCP, FACPDATE OF BIRTH:  1938[birth year]01/06  DATE OF STUDY:  06/15/2010                           NOCTURNAL POLYSOMNOGRAM  REFERRING PHYSICIAN:  Alexzander Dolinger D Caress Reffitt  INDICATION FOR STUDY:  Hypersomnia with sleep apnea.  EPWORTH SLEEPINESS SCORE:  5/24, BMI 30.1.  Weight 170 pounds, height 63 inches.  Neck 18 inches.  MEDICATIONS:  Home medications charted and reviewed.  A baseline diagnostic NPSG on October 14, 2007, is recorded an AHI of 13.8 per hour.  SLEEP ARCHITECTURE:  Total sleep time 257 minutes with sleep efficiency 71.7%.  Stage I was 12.1%, stage II 73%, stage III absent, REM 15% of total sleep time.  Sleep latency 24 minutes, REM latency 166 minutes, awake after sleep onset 65 minutes, arousal index 20.5.  Bedtime medication:  Elavil.  RESPIRATORY DATA:  Apnea/hypopnea index (AHI) 36.4 per hour.  A total of 156 events was scored including 71 obstructive apneas and 85 hypopneas. Events were associated with nonsupine sleep.  REM AHI 68.6.  She did not have enough early events to meet the timing requirements for application of CPAP titration by split protocol on the study night.  OXYGEN DATA:  Moderately loud snoring with oxygen desaturation to a nadir of 61% and a mean oxygen saturation through the study of 90.8% on room air through the study.  CARDIAC DATA:  Normal sinus rhythm.  MOVEMENT-PARASOMNIA:  No significant movement disturbance.  Bathroom x1.  IMPRESSIONS-RECOMMENDATIONS: 1. Severe obstructive sleep apnea/hypopnea syndrome, AHI 36.4 per     hour.  Moderately loud snoring with oxygen desaturation to a nadir     of 61% and a mean oxygen saturation through the study of 90.8% on     room air. 2. She did not meet protocol requirements in time to permit  initiation     of CPAP titration by split protocol on the     study night.  Consider return for dedicated CPAP titration study or     evaluate for alternative management as clinically appropriate.     [name], MD, [facility], FACP Diplomate, Biomedical engineer of Sleep Medicine Electronically Signed    CDY/MEDQ  D:  06/20/2010 11:24:29  T:  06/20/2010 23:55:27  Job:  [id number]

## 2010-06-22 ENCOUNTER — Other Ambulatory Visit: Payer: Self-pay | Admitting: Oncology

## 2010-06-22 ENCOUNTER — Other Ambulatory Visit (HOSPITAL_COMMUNITY): Payer: Medicare Other

## 2010-06-22 ENCOUNTER — Encounter (HOSPITAL_BASED_OUTPATIENT_CLINIC_OR_DEPARTMENT_OTHER): Payer: Medicare Other | Admitting: Oncology

## 2010-06-22 ENCOUNTER — Other Ambulatory Visit: Payer: Medicare Other

## 2010-06-22 DIAGNOSIS — C50419 Malignant neoplasm of upper-outer quadrant of unspecified female breast: Secondary | ICD-10-CM

## 2010-06-22 LAB — COMPREHENSIVE METABOLIC PANEL
Albumin: 3.9 g/dL (ref 3.5–5.2)
BUN: 16 mg/dL (ref 6–23)
CO2: 18 mEq/L — ABNORMAL LOW (ref 19–32)
Calcium: 9.1 mg/dL (ref 8.4–10.5)
Glucose, Bld: 127 mg/dL — ABNORMAL HIGH (ref 70–99)
Potassium: 3.8 mEq/L (ref 3.5–5.3)
Sodium: 139 mEq/L (ref 135–145)
Total Protein: 6.4 g/dL (ref 6.0–8.3)

## 2010-06-22 LAB — CBC WITH DIFFERENTIAL/PLATELET
Basophils Absolute: 0 10*3/uL (ref 0.0–0.1)
Eosinophils Absolute: 0.3 10*3/uL (ref 0.0–0.5)
HGB: 12.9 g/dL (ref 11.6–15.9)
MONO#: 0.5 10*3/uL (ref 0.1–0.9)
NEUT#: 8.3 10*3/uL — ABNORMAL HIGH (ref 1.5–6.5)
Platelets: 279 10*3/uL (ref 145–400)
RBC: 4.64 10*6/uL (ref 3.70–5.45)
RDW: 15.4 % — ABNORMAL HIGH (ref 11.2–14.5)
WBC: 10.6 10*3/uL — ABNORMAL HIGH (ref 3.9–10.3)

## 2010-06-22 LAB — VITAMIN D 25 HYDROXY (VIT D DEFICIENCY, FRACTURES): Vit D, 25-Hydroxy: 52 ng/mL (ref 30–89)

## 2010-06-22 LAB — CANCER ANTIGEN 27.29: CA 27.29: 26 U/mL (ref 0–39)

## 2010-06-23 ENCOUNTER — Ambulatory Visit (INDEPENDENT_AMBULATORY_CARE_PROVIDER_SITE_OTHER): Payer: Medicare Other | Admitting: Internal Medicine

## 2010-06-23 ENCOUNTER — Encounter: Payer: Self-pay | Admitting: Internal Medicine

## 2010-06-23 VITALS — BP 124/74 | HR 103 | Ht 62.0 in

## 2010-06-23 DIAGNOSIS — F411 Generalized anxiety disorder: Secondary | ICD-10-CM

## 2010-06-23 DIAGNOSIS — G4700 Insomnia, unspecified: Secondary | ICD-10-CM

## 2010-06-23 NOTE — Assessment & Plan Note (Signed)
We will try to get her going again on CPAP- will autotitrate.

## 2010-06-23 NOTE — Progress Notes (Signed)
  Subjective:    Patient ID: [name], female    DOB: 08/24/1935, 75 y.o.   MRN: [id number]  HPI 06/23/10-75 yoF followed for sleep apnea with complaint of dyspnea, complicated by insomnia, hx breast Ca. Last here February 05, 2010. Since then was hosp 05/13/10 - r/0'd MI but has known CAD/ stents.CXR was w/o acute process. She denies dyspnea with ADL's, mostly sitting. Sleep study 06/15/10- AHI 36.4/ hr.  She says she thinks she can use CPAP now and is willing to try again, after turning in her previous CPAP.  She blames unknown cat-burglar for taking her lorazepam and asks I  refill Dr Carie Caddy script early- I declined.   Review of Systems Constitutional:   No- weight loss, night sweats,  Fevers, chills, fatigue, lassitude. HEENT:   No- headaches,  Difficulty swallowing,  Tooth/dental problems,  Sore throat,                No- sneezing, itching, ear ache, nasal congestion, post nasal drip,   CV:  No chest pain,  Orthopnea, PND, swelling in lower extremities, anasarca, dizziness, palpitations  GI  No heartburn, indigestion, abdominal pain, nausea, vomiting, diarrhea, change in bowel habits, loss of appetite  Resp:   No excess mucus, no productive cough,  No non-productive cough,  No coughing up of blood.  No change in color of mucus.  No wheezing. Skin: no rash or lesions.  GU: no dysuria, change in color of urine, no urgency or frequency.  No flank pain.  MS:  No joint pain or swelling.  No decreased range of motion.  No back pain.  Psych:  No change in mood or affect.  No memory loss.      Objective:   Physical Exam General- Alert, Oriented, Affect-appropriate, Distress- none acute;  obese, intelligent, wheelchair.   Skin- rash-none, lesions- none, excoriation- none  Lymphadenopathy- none  Head- atraumatic  Eyes- Gross vision intact, PERRLA, conjunctivae clear, secretions  Ears- Normal-  Hearing, canals, Tm  Nose- Clear,  No- Septal dev, mucus, polyps, erosion,  perforation   Throat- Mallampati II , mucosa clear , drainage- none, tonsils- atrophic  Neck- flexible , trachea midline, no stridor , thyroid nl, carotid no bruit  Chest - symmetrical excursion , unlabored     Heart/CV- RRR , no murmur , no gallop  , no rub, nl s1 s2                     - JVD- none , edema- none, stasis changes- none, varices- none     Lung- clear to P&A, wheeze- none, cough- none , dullness-none, rub- none     Chest wall- mastectomy  Abd- tender-no, distended-no, bowel sounds-present, HSM- no  Br/ Gen/ Rectal- Not done, not indicated  Extrem- cyanosis- none, clubbing, none, atrophy- none, strength- nl  Neuro- grossly intact to observation        Assessment & Plan:

## 2010-06-23 NOTE — Patient Instructions (Signed)
We will get started on CPAP. We will contact your DME company  Order- CPAP start-   autotitrate 5-15 cwp x 14 days for pressure check.               Mask of choice, humidifier

## 2010-06-25 ENCOUNTER — Telehealth: Payer: Self-pay | Admitting: Cardiology

## 2010-06-25 NOTE — Telephone Encounter (Signed)
Spoke with pt, she reports loosing her lorazepam. She became anxious and went to urgent care and they gave her more lorazepam. When she was at the pharm she took one NTG to help get her bp down. She went home and slept for a long time. She has been unable to pick up the plavix samples. She was seen by dr young and told she has severe sleep apnea and is going to restart CPAP. She was told she stopped breathing 30 times. She will call with any problems Deliah Goody

## 2010-06-25 NOTE — Telephone Encounter (Signed)
Pt wants a call regarding her health condition

## 2010-06-28 ENCOUNTER — Encounter: Payer: Self-pay | Admitting: Internal Medicine

## 2010-06-28 NOTE — Assessment & Plan Note (Signed)
Followed by Dr Evelene Croon. She gave me unlikely report that a "cat-burglar" was entering her home unseen through locked doors to take her lorazepam. She is to discuss that with Dr Evelene Croon.

## 2010-06-29 ENCOUNTER — Telehealth: Payer: Self-pay | Admitting: Cardiology

## 2010-06-29 NOTE — Telephone Encounter (Signed)
Please call pt regarding Medication.  Call at your convenience.

## 2010-06-29 NOTE — Telephone Encounter (Signed)
Spoke with pt, questions answered [name]  

## 2010-06-30 NOTE — Assessment & Plan Note (Signed)
[name]                            CARDIOLOGY OFFICE NOTE   NAME:Bowlds, DONATELLA WALSKI                        MRN:          [id number]  DATE:08/28/2007                            DOB:          April 11, 1935    [name] was seen in followup at the [facility] Cardiology office on  August 28, 2007.  She is a 75 year old woman with hypertension and  dyslipidemia.  She has had a few episodes of chest pain and shortness of  breath, but she has really been stable since I have been seeing her for  the past year.  Her chest pains were highly atypical and nonexertional  in nature.  Her chronic dyspnea is unchanged.  She has had a normal  echocardiogram in the past and no other cardiac workup has been done.   From a symptomatic standpoint, Ms. Denk is doing quite well at present.  She denies orthopnea, PND, or edema.  She has had no recent chest pain.  She has no other complaints today.   MEDICATIONS:  1. Fish oil.  2. Caltrate 600 mg plus D twice daily.  3. Vitamin.  4. Magnesium.  5. Lipitor 20 mg daily.  6. Arimidex 1 mg daily.  7. Quinapril 40 mg daily.  8. Remeron 15 mg 2 at bedtime.  9. B12 daily.  10.Ativan 2 mg twice daily.  11.Ambien 10 mg at bedtime.  12.Cinnamon 2 daily.   ALLERGIES:  NKDA.   PHYSICAL EXAMINATION:  GENERAL:  She is alert and oriented, in no acute  distress.  VITAL SIGNS:  Weight is 235 pounds; blood pressure 120/80; heart rate  initially was 104, on my recheck it is 90.  HEENT:  Normal.  NECK:  Normal carotid upstrokes without bruits.  Jugular venous pressure  is normal.  LUNGS:  Clear to auscultation bilaterally.  HEART:  Regular rate and rhythm without murmurs or gallops.  ABDOMEN:  Soft and nontender.  No bruits.  EXTREMITIES:  No clubbing, cyanosis, or edema.  Peripheral pulses 2+ and  equal throughout.   ASSESSMENT:  1. Hypertension.  Blood pressure under reasonably good control.      Prescription for quinapril was  refilled today.  2. Dyslipidemia.  I do not have a copy of her lipids, but of note, her      LFTs were normal in March 2009 with the exception of her alkaline      phosphatase.  The transaminases were normal.  The alkaline      phosphatase was 167.  Bilirubin was normal.  Her Lipitor was      refilled.  She is due for lipids of which the blood draw will be      scheduled.   For followup, I would like to see Ms. Ohlinger back in 4 months.     Veverly Fells. Excell Seltzer, MD  Electronically Signed    MDC/MedQ  DD: 08/28/2007  DT: 08/29/2007  Job #: [id number]

## 2010-06-30 NOTE — Assessment & Plan Note (Signed)
[facility]                            CARDIOLOGY OFFICE NOTE   NAME:[name], [name] FATULA                        MRN:          [id number]  DATE:10/26/2006                            DOB:          [birth year]    [name] [name] seen at the [facility] Cardiology office on October 26, 2006.  [name] is a 75 year old woman who is self-referred for  evaluation of cardiomegaly and shortness of breath.  She [name] seen at  [facility] Cardiology but I do not have those office records.  [name]  [name] initially evaluated with a chest x-ray for a chronic cough and  shortness of breath.  Her chest x-ray performed on August 8 [name]  interpreted as mild cardiomegaly.  She underwent an echocardiogram for  further evaluation.  By her report, her echocardiogram [name] unremarkable.  Again, I do not have the echocardiogram films or the report for review  at this time.  She comes here for a second opinion regarding her cardiac  status.   [name] has no documented history of cardiac disease.  Her cardiac risk  factors include only dyslipidemia.  She complains of some exertional  dyspnea but denies chest pain, orthopnea or PND.  She has not had  lightheadedness, syncope or palpitations.  She has a great deal of  stress and anxiety that seem to be her major complaint at this time.  She is very concerned about having a heart problem.   PAST MEDICAL HISTORY:  Includes a history of breast cancer status post  right mastectomy in 2002.  She had chemotherapy through the middle of  2003 and has had no recurrence.  Other past medical problems include  hypertension recently started on antihypertensive therapy with  quinapril, dyslipidemia, and arthroscopic surgery on her knee because of  osteoarthritis.  Otherwise, she has no past hospitalizations or other  surgeries.   SOCIAL HISTORY:  The patient is divorced.  She has two children.  She is  not employed.  She does not smoke cigarettes or  drink alcohol.  She  participates in a modest amount of exercise.   FAMILY HISTORY:  Her mother died at age 42 and father died at age 50.  She does not know their history well and does not know what medical  problems they have had.  She has a brother who died of a brain tumor.   CURRENT MEDICATIONS:  1. Fish oil daily.  2. Caltrate 600 mg plus D twice daily.  3. Vitamin D daily.  4. Multivitamin daily.  5. Super B complex daily.  6. Garlic capsules twice daily.  7. Lipitor 20 mg daily.  8. Arimidex 1 mg daily.  9. Quinapril 40 mg daily.   ALLERGIES:  No known drug allergies.   REVIEW OF SYSTEMS:  Pertinent positives included seasonal allergies,  dyspnea, constipation, fatigue, anxiety and depression.   PHYSICAL EXAMINATION:  The patient is alert and oriented.  She is an  anxious woman in no acute distress.  She is obese.  Her weight is 242  pounds, blood pressure is 124/70,  heart rate initially [name] 108 and on my  check [name] 96, respiratory rate 20.  HEENT:  Normal  NECK:  Normal carotid upstrokes without bruits.  Jugular venous pressure  is normal.  No thyromegaly or thyroid nodules.  LUNGS:  Clear to auscultation bilaterally.  CARDIOVASCULAR:  The apex is not palpable.  There is no right  ventricular heave or lift.  HEART:  Regular rate and rhythm without murmurs or gallops.  ABDOMEN:  Soft, obese, nontender.  No organomegaly, no abdominal bruits.  BACK:  There is no CVA tenderness.  EXTREMITIES:  No clubbing, cyanosis, or edema.  Peripheral pulses are 2+  and equal throughout.  SKIN:  Warm and dry without rash.  NEUROLOGIC:  Cranial nerves II-XII are intact.  Strength intact and  equal bilaterally.   EKG shows sinus tachycardia and is otherwise within normal limits.   ASSESSMENT:  [name] is a 75 year old woman with cardiomegaly reported  on a chest x-ray.  Her risks for cardiomegaly and cardiomyopathy include  mainly her history of hypertension.  It appears that the  diagnosis is  only a recent one.  She does not have evidence of vascular disease.  By  report her echocardiogram [name] normal, and this is certainly reassuring.   We will send for her records as well as a copy of her echocardiogram  film so I can personally review that.  I tried to reassure [name]  regarding her overall cardiac status.  She certainly would benefit from  dietary and lifestyle modification program, but her cardiac risk factors  seem to be under good control as she has very good blood pressure in the  office today.  I do not have a copy of her lipid panel but she is under  treatment with atorvastatin under the care of her primary care  physician.  Will plan on bringing her back after I am able to obtain her  records and review her echocardiogram for a followup visit in a few  months.  I did not recommend any changes to her medical program today.     Veverly Fells. Excell Seltzer, MD  Electronically Signed    MDC/MedQ  DD: 10/30/2006  DT: 10/31/2006  Job #: [id number]

## 2010-06-30 NOTE — Assessment & Plan Note (Signed)
[facility]                            CARDIOLOGY OFFICE NOTE   NAME:Asche, BRITNI DRISCOLL                        MRN:          [id number]  DATE:01/19/2007                            DOB:          05-17-1935    [name] was seen in followup at the [facility] Cardiology office on  January 19, 2007. Ms. Tedder is a 75 year old woman with a history of  hypertension and dyslipidemia. I initially saw her back on September  10th. She had been seen in another practice and there was a question of  cardiomegaly on a chest x-ray. It was only borderline. She underwent an  echocardiogram that showed normal LV size with borderline LV wall  thickness and normal LV systolic function. There was also notation of  aortic valve sclerosis without stenosis. She continues to be very  concerned about her cardiac status.   Ms. Arras has had some atypical fleeting chest pains. These have been  improved since I last saw her. She has no exertional chest pain or  pressure. She does complain of chronic exertional dyspnea that is  unchanged. She denies palpitations, orthopnea or PND. She has no other  complaints at this point.   CURRENT MEDICATIONS:  1. Fish oil.  2. Caltrate 600 mg plus D twice daily.  3. Multivitamin daily.  4. Super B complex daily.  5. Garlic.  6. Lipitor 20 mg daily.  7. Arimidex 1 mg daily.  8. Quinapril 40 mg daily.  9. Ativan 2 mg twice daily.  10.Remeron 15 mg two at bedtime.  11.Trazodone at bedtime.  12.Vitamin B12.   ALLERGIES:  No known drug allergies.   PHYSICAL EXAMINATION:  The patient is alert and oriented and in no acute  distress. She is an obese woman. Weight is 240 pounds. Blood pressure  128/68, heart rate 99, respiratory rate is 16.  HEENT: Normal.  NECK: Normal carotid upstrokes without bruits. Jugular venous pressure  is normal.  LUNGS:  Clear to auscultation bilaterally.  CARDIOVASCULAR: Heart is regular rate and rhythm without  murmurs or  gallops.  ABDOMEN: Soft and nontender. No organomegaly. No bruits.  EXTREMITIES: No clubbing, cyanosis or edema. Peripheral pulses are 2+  and equal throughout.   ASSESSMENT:  1. Atypical chest pain, improved. No further workup needed at this      point.  2. Hypertension. The patient's blood pressure continues to be under      good control. No changes were made.  3. Dyslipidemia. Her lipids have been followed by her primary care      physician. It sounds as if she may be between physicians at this      point. Will obtain followup lipids and LFTs.   For followup, I will see Ms. Tuckey back in four months. She is otherwise  stable from a cardiac standpoint.     Veverly Fells. Excell Seltzer, MD  Electronically Signed    MDC/MedQ  DD: 01/25/2007  DT: 01/25/2007  Job #: [id number]

## 2010-06-30 NOTE — Procedures (Signed)
NAME:  [name], [name]                 ACCOUNT NO.:  [id number]   MEDICAL RECORD NO.:  [id number]          PATIENT TYPE:  OUT   LOCATION:  SLEEP CENTER                 FACILITY:  [facility]   PHYSICIAN:  [name], MD, FCCP, FACPDATE OF BIRTH:  Mar 19, 1937   DATE OF STUDY:  10/14/2007                            NOCTURNAL POLYSOMNOGRAM   REFERRING PHYSICIAN:  [name], MD, FCCP, FACP   REFERRING PHYSICIAN:  Clinton D. Young, MD, FCCP, FACP   INDICATION FOR STUDY:  Hypersomnia with sleep apnea.   EPWORTH SLEEPINESS SCORE:  2/24.  BMI 43.7.  Weight 239 pounds.  Height  62 inches.  Neck 18 inches.   HOME MEDICATION:  Charted and reviewed.   SLEEP ARCHITECTURE:  Total sleep time 395 minutes with sleep efficiency  89.3%.  Stage 1 was 3.5%.  Stage 2 84.8%.  Stage 3 absent.  REM 11.6% of  total sleep time.  Sleep latency 13 minutes.  REM latency 362 minutes.  Awake after sleep onset 35 minutes.  Arousal index 3.2.  Bedtime  medication included mirtazapine, Ambien and Lipitor.  Sleep architecture  was unusual because of sustained stage 2 sleep with wakefulness between  2 and 3 a.m., return to stage 2 sleep before REM was achieved at 4 a.m.  The pattern is consistent with medication effect.   RESPIRATORY DATA:  Apnea hypopnea index (AHI) 13.8 per hour.  A total of  91 events were counted, including 6 obstructive apneas, 2 mixed apneas  and 83 hypopneas.  Events were not positional.  REM/AHI 52.2 per hour.  The technician reported insufficient events for CPAP titration by split  protocol on this study night.   OXYGEN DATA:  Very loud snoring with oxygen desaturation to a nadir of  77%.  Mean oxygen saturation through the study was 88.3% on room air.  A  total of 151 minutes was recorded with saturation less than 88%.   CARDIAC DATA:  Normal sinus rhythm.   MOVEMENT-PARASOMNIA:  No significant movement disturbance.  Bathroom x1.   IMPRESSIONS-RECOMMENDATIONS:  1. Mild obstructive  sleep apnea/hypopnea syndrome, AHI 13.8 per hour      with nonpositional events, loud snoring and oxygen desaturation to      a nadir of 77%.  2. She reportedly had insufficient early events to permit CPAP      titration by split protocol on this study night.  Consider return      for CPAP titration with evaluation for alternative management as      appropriate.  3. Sustained oxygen desaturation on room air with a nadir of 77%, mean      oxygen saturation of 88.3% and a total      of 151 minutes recorded with saturation less than 88%.  Assessment      of overnight oximetry would be appropriate whether or not CPAP is      chosen.      [name], MD, FCCP, FACP  Diplomate, Biomedical engineer of Sleep Medicine  Electronically Signed     CDY/MEDQ  D:  10/21/2007 15:56:00  T:  10/22/2007 16:45:10  Job:  [id number]

## 2010-06-30 NOTE — Assessment & Plan Note (Signed)
[facility]                                 ON-CALL NOTE   NAME:[name], [name] CHUNN                        MRN:          [id number]  DATE:06/25/2007                            DOB:          09-Aug-1935    PRIMARY CARDIOLOGIST:  Dr. Tonny Bollman   ATTENDING PHYSICIAN:  Dr. Sherryl Manges   I have received a phone call from [facility] complaining of swelling in  her lower extremities.  The patient has become concerned as she has had  persistent lower extremity edema.  On review of most recent office note  dated May 08, 2007, she also complained of some intermittent edema to  Dr. Excell Seltzer.  At that time he gave her a prescription for Lasix 40 mg to  be used only on a p.r.n. basis.  The patient states that she has done  that.  However, she does not have any more Lasix.  She has also been  started on Ambien and an anti-anxiety medicine which she takes at night  in order to help her sleep, and she has noticed that the swelling has  gotten worse since taking these medications.  She has become dependent  on the sleeping medications and the anti-anxiety medications to allow  her to sleep at night although that she is having these side effects,  and she is requesting help concerning the lower extremity edema.   Upon reading Dr. Earmon Phoenix note, he was very reluctant to give her Lasix  and did state that it would be used on a p.r.n. basis only.  However,  the patient did use them up fairly rapidly and I am unwilling to give  her another refill on the Lasix without checking with Dr. Excell Seltzer first.  I advised the patient if she is able to not take the Ambien one night  and just take her anti-anxiety medicine this may help with the extremity  edema, as Ambien has been known to cause some fluid retention in some  patients.  She has been advised to keep her feet elevated.  She asked  about drinking cranberry juice to help with some diuresis and I told  that that would be  okay.  The patient talked lengthily about her signs  and symptoms of rheumatoid arthritis and anxiety, and although I did  listen I did not advise any more medication changes.  She is to follow  up with Dr. Excell Seltzer in July and I have advised her that if she continues  to have lower extremity edema or any other side effects that she should  contact the physician who prescribed the Ambien and the anti-anxiety  medications.  She verbalized understanding.      Bettey Mare. Lyman Bishop, NP  Electronically Signed      Duke Salvia, MD, [facility]  Electronically Signed   KML/MedQ  DD: 06/25/2007  DT: 06/25/2007  Job #: [id number]

## 2010-06-30 NOTE — Assessment & Plan Note (Signed)
[facility]                            CARDIOLOGY OFFICE NOTE   NAME:[name]                        MRN:          [id number]  DATE:05/08/2007                            DOB:          11-16-1935    [name] was seen in followup at the [facility]iology office on  May 08, 2007.  [name] is a 75 year old woman with hypertension and  dyslipidemia.  She has had episodes of chest pain and shortness of  breath.  Her chest pains have been highly atypical and nonexertional in  nature.  She does have chronic exertional dyspnea.  She was initially  quite worried about her cardiac status after being diagnosed with  borderline cardiomegaly on a plain chest x-ray.  She ultimately  underwent an echocardiogram that showed normal left ventricular size and  systolic function, and she was reassured.   [name] was observed in the emergency department on April 27, 2007 with  chest pain that she characterized as a stabbing type pain.  Her pain had  resolved by the time she was evaluated by the physician.  She was ruled  out with a normal EKG and cardiac enzymes and discharged home.  She has  had no further problems with chest pain or dyspnea since that  evaluation.  She was noted to have an elevated D-dimer, and she  underwent a CT angiogram of the chest to rule out pulmonary embolism  which was negative.   Otherwise, [name] is doing relatively well.  She has had some trouble  with sleep and has been a disagreement regarding medications with her  psychiatrist.  She is going to be evaluated with [facility] at  [place].   CURRENT MEDICATIONS:  1. Fish oil.  2. Caltrate 600 mg plus D twice daily.  3. Multivitamin daily.  4. Super B complex.  5. Garlic.  6. Lipitor 20 mg daily.  7. Arimidex 1 mg daily.  8. Quinapril 40 mg daily.  9. Remeron 15 mg two at bedtime.  10.Ativan 2 mg twice daily.  11.Ambien 10 mg at bedtime.   ALLERGIES:  NO KNOWN  DRUG ALLERGIES.   PHYSICAL EXAMINATION:  GENERAL:  The patient is alert and oriented.  She  is in no distress.  VITAL SIGNS:  Weight is 233, blood pressure is 114/64, heart rate 88,  respiratory rate 16.  HEENT:  Normal.  NECK:  Normal carotid upstrokes without bruits.  Jugular venous pressure  is normal.  LUNGS:  Clear bilaterally.  HEART:  Regular rate and rhythm without murmurs or gallops.  ABDOMEN:  Soft, nontender.  No organomegaly.  EXTREMITIES:  No clubbing, cyanosis or edema.  Peripheral pulses 2+ and  equal throughout.   EKG from April 17, 2007 showed normal sinus rhythm and was within normal  limits.   ASSESSMENT:  [name] is currently stable from a cardiac standpoint.  She has had some recurrence of atypical chest pain.  She has had no  problems over the last few weeks and continues to have no problems with  physical exertion.  She  was reassured today regarding her cardiac  status.  She should continue on her current therapy which includes  Lipitor for dyslipidemia and quinapril for hypertension.  Her  hypertension is well-controlled.  Her primary physician has been  following her lipids, and I presume they are under good control as well.  She has had some problems with intermittent edema, and while there is no  edema on exam today, I wrote her a prescription for Lasix 40 mg to be  used only on a p.r.n. basis.  I asked her not to use this more than once  or twice weekly.  I will plan on seeing [name] back in 4 months.     Veverly Fells. Excell Seltzer, MD  Electronically Signed    MDC/MedQ  DD: 05/09/2007  DT: 05/09/2007  Job #: [id number]

## 2010-06-30 NOTE — Assessment & Plan Note (Signed)
[facility]                            CARDIOLOGY OFFICE NOTE   NAME:[name], [name]                        MRN:          [id number]  DATE:12/14/2006                            DOB:          [birth year]    PRIMARY CARE PHYSICIAN:  Dr. Excell Seltzer.   HISTORY OF PRESENT ILLNESS:  This is a 75 year old white female patient  whom Dr. Excell Seltzer first saw back on October 26, 2006 because of  cardiomegaly on a chest x-ray.  Her risks for cardiomegaly and  cardiomyopathy include mainly her history of hypertension, which has  been under control.  She has a history of panic attacks and was being  treated for that.  She recently had a couple episodes of pins and  needles under her left breast and went to [facility] for this.  They recommended she follow up with [place].  She denies any chest pressure,  palpitations, dizziness or presyncope.  She does get short of breath  when she walks, but this was felt to be exercise intolerance, as she is  very sedentary and is not used to walking.  She got quite short of  breath walking to the office today because she usually does not walk  that far.  She does do some stretching exercises and is planning on  enrolling into a water aerobic program recommended by Dr. Excell Seltzer.   CURRENT MEDICATIONS:  1. Fish oil one daily.  2. Caltrate 600 mg plus D b.i.d.  3. Vitamin D daily.  4. Multivitamin daily.  5. Super B complex daily.  6. Magnesium daily.  7. Garlic caps twice a day.  8. Vitamin for the eye daily.  9. Lipitor 20 mg daily.  10.Arimidex 1 mg daily.  11.Quinapril 40 mg daily.  12.Ativan 2 mg b.i.d.  13.Rozerem 15 mg two q.h.s.   PHYSICAL EXAMINATION:  GENERAL:  This is an obese 75 year old white  female in no acute distress.  VITAL SIGNS:  Blood pressure 114/64, pulse 105, weight 237.  NECK:  Without JVD, hepatojugular reflux, bruit or thyroid enlargement.  LUNGS:  Clear anterior and posterolateral.  HEART:   Regular rate and rhythm at 100 beats per minute.  Normal S1 and  S2.  No murmur, rub, bruit, thrill or heave noted.  ABDOMEN:  Obese.  Normoactive bowel sounds are heard throughout.  EXTREMITIES:  Without clubbing, cyanosis, or edema.  She has good distal  pulses.   IMPRESSION:  1. Chest pain described as pins and needles, felt to be non-cardiac.  2. Hypertension with cardiomegaly on chest x-ray.  Normal left      ventricular size with upper limits of normal wall thickness and      normal systolic function.  Aortic valve sclerosis without stenosis      on 2-D echo in August of 2008.  3. Exercise intolerance.  4. Dyspnea on exertion secondary to exercise intolerance.  5. Hyperlipidemia, treated.  6. Right mastectomy and chemotherapy for breast cancer.  7. Osteoarthritis.  8. History of panic attacks.   PLAN AT THIS TIME:  I have not made  any medication changes.  I do not  think her chest pain is cardiac related, and she has an appointment to  see Dr. Excell Seltzer back on January 19, 2007 to discuss any further medical  changes.   EKG revealed sinus tachycardia of 105 beats per minute, done after she  walked in here from the parking lot.      Jacolyn Reedy, PA-C  Electronically Signed      Jesse Sans. Daleen Squibb, MD, [facility]  Electronically Signed   ML/MedQ  DD: 12/14/2006  DT: 12/14/2006  Job #: [id number]

## 2010-07-02 ENCOUNTER — Telehealth: Payer: Self-pay | Admitting: Internal Medicine

## 2010-07-02 ENCOUNTER — Encounter: Payer: Self-pay | Admitting: Cardiology

## 2010-07-02 ENCOUNTER — Ambulatory Visit (INDEPENDENT_AMBULATORY_CARE_PROVIDER_SITE_OTHER): Payer: Medicare Other | Admitting: Cardiology

## 2010-07-02 DIAGNOSIS — I2510 Atherosclerotic heart disease of native coronary artery without angina pectoris: Secondary | ICD-10-CM

## 2010-07-02 DIAGNOSIS — E7800 Pure hypercholesterolemia, unspecified: Secondary | ICD-10-CM

## 2010-07-02 DIAGNOSIS — Z79899 Other long term (current) drug therapy: Secondary | ICD-10-CM

## 2010-07-02 DIAGNOSIS — R072 Precordial pain: Secondary | ICD-10-CM

## 2010-07-02 DIAGNOSIS — I10 Essential (primary) hypertension: Secondary | ICD-10-CM

## 2010-07-02 DIAGNOSIS — G4730 Sleep apnea, unspecified: Secondary | ICD-10-CM

## 2010-07-02 DIAGNOSIS — E785 Hyperlipidemia, unspecified: Secondary | ICD-10-CM

## 2010-07-02 DIAGNOSIS — R0600 Dyspnea, unspecified: Secondary | ICD-10-CM

## 2010-07-02 NOTE — Assessment & Plan Note (Signed)
Continue aspirin, Plavix and statin. 

## 2010-07-02 NOTE — Assessment & Plan Note (Signed)
Problems with recurrent chest pain. Recent jaw pain most likely not cardiac. Previous Myoview negative. Continue medical therapy.

## 2010-07-02 NOTE — Progress Notes (Signed)
HPI: Pleasant female for followup of coronary artery disease. Patient is status post PCI of her LAD in May of 2011. She had a drug-eluting stent at that time. There was no significant disease in her circumflex or right coronary artery. Her LV function was normal. She has had problems with recurrent atypical chest pain. A Myoview was performed in November of 2011 and showed an EF of 72% and normal perfusion. She was admitted in March of 2012 with recurrent chest pain and ruled out. Since she was last seen she states she had jaw pain for several seconds but otherwise has not had exertional chest pain. She occasionally has pain on the right side of her chest from previous mastectomy. There is some dyspnea on exertion but no orthopnea, PND, pedal edema, palpitations or syncope.  Current Outpatient Prescriptions  Medication Sig Dispense Refill  . amitriptyline (ELAVIL) 50 MG tablet Take 50 mg by mouth at bedtime.        Marland Kitchen anastrozole (ARIMIDEX) 1 MG tablet Take 1 mg by mouth daily.        Marland Kitchen aspirin 81 MG EC tablet Take 81 mg by mouth daily.        Marland Kitchen atorvastatin (LIPITOR) 40 MG tablet Take 40 mg by mouth daily.        . Calcium Carbonate-Vit D-Min (CALTRATE 600+D PLUS) 600-400 MG-UNIT per tablet Take 1 tablet by mouth daily.        . Calcium Carbonate-Vitamin D 600-400 MG-UNIT per tablet Take 1 tablet by mouth daily.        . Cinnamon 500 MG capsule Take 3 by mouth once daily      . clopidogrel (PLAVIX) 75 MG tablet Take 75 mg by mouth daily.        . fish oil-omega-3 fatty acids 1000 MG capsule Take 2 g by mouth daily.        . isosorbide mononitrate (IMDUR) 30 MG 24 hr tablet Take 30 mg by mouth daily.        Marland Kitchen LORazepam (ATIVAN) 2 MG tablet Take  1 by mouth 2 times a day       . magnesium gluconate (MAGONATE) 1000 (54 MG) MG/5ML syrup 3 (three) times daily. Take 2-3 daily      . nitroGLYCERIN (NITROSTAT) 0.4 MG SL tablet Place 0.4 mg under the tongue every 5 (five) minutes as needed.        .  quinapril (ACCUPRIL) 20 MG tablet Take 20 mg by mouth at bedtime.           Past Medical History  Diagnosis Date  . Sleep apnea     insomnia  . Hyperlipidemia   . Hypertension   . Anxiety   . Chronic headache   . Osteoarthritis   . Panic attacks   . CAD (coronary artery disease)   . Malignant neoplasm of breast     Past Surgical History  Procedure Date  . Cholecystectomy   . Right mastectomy   . Tonsillectomy   . Knee surgery     History   Social History  . Marital Status: Divorced    Spouse Name: N/A    Number of Children: 2  . Years of Education: N/A   Occupational History  . Unemployed    Social History Main Topics  . Smoking status: Never Smoker   . Smokeless tobacco: Not on file  . Alcohol Use: No  . Drug Use: Not on file  . Sexually Active: Not on file  Other Topics Concern  . Not on file   Social History Narrative  . No narrative on file    ROS: no fevers or chills, productive cough, hemoptysis, dysphasia, odynophagia, melena, hematochezia, dysuria, hematuria, rash, seizure activity, orthopnea, PND, pedal edema, claudication. Remaining systems are negative.  Physical Exam: Well-developed well-nourished in no acute distress.  Skin is warm and dry.  HEENT is normal.  Neck is supple. No thyromegaly.  Chest is clear to auscultation with normal expansion.  Cardiovascular exam is regular rate and rhythm.  Abdominal exam nontender or distended. No masses palpated. Extremities show no edema. neuro grossly intact  ECG

## 2010-07-02 NOTE — Assessment & Plan Note (Signed)
Blood pressure controlled. Continue present medications. 

## 2010-07-02 NOTE — Telephone Encounter (Signed)
lmomtcb x1 

## 2010-07-02 NOTE — Assessment & Plan Note (Signed)
Continue statin. Check lipids and liver. 

## 2010-07-02 NOTE — Patient Instructions (Signed)
Your physician wants you to follow-up in: 6 MONTHS You will receive a reminder letter in the mail two months in advance. If you don't receive a letter, please call our office to schedule the follow-up appointment.   Your physician recommends that you return for lab work in: WHEN FASTING

## 2010-07-02 NOTE — Telephone Encounter (Signed)
ATC line busy x 3 WCB 

## 2010-07-03 NOTE — Op Note (Signed)
[place]  Patient:    [name], [name] Visit Number: [id number] MRN: [id number]          Service Type: END Location: ENDO Attending Physician:  Charna Elizabeth Dictated by:   Anselmo Rod, M.D. Proc. Date: 09/04/01 Admit Date:  09/04/2001 Discharge Date: 09/04/2001   CC:         Pierce Crane, M.D.  [name], M.D.  Julieanne Manson, M.D.   Operative Report  DATE OF BIRTH:  [birth year]  PROCEDURE PERFORMED:  Colonoscopy with snare polypectomy x1.  ENDOSCOPIST:  Anselmo Rod, M.D.  INSTRUMENT USED:  Olympus videocolonoscope.  INDICATIONS FOR PROCEDURE:  Rectal bleeding and a personal history of breast cancer in a 75 year old white female.  Rule out colonic polyps, masses, hemorrhoids, etc.  PREPROCEDURE PREPARATION:  Informed consent was procured from the patient. The patient was fasted for eight hours prior to the procedure and prepped with a bottle of magnesium citrate and a gallon of NuLytely the night prior to the procedure.  PREPROCEDURE PHYSICAL:  VITAL SIGNS:  The patient had stable vital signs.  NECK:  Supple.  CHEST:  Clear to auscultation.  ABDOMEN:  Soft with normal bowel sounds.  DESCRIPTION OF THE PROCEDURE:   The patient was placed in the left lateral decubitus position and sedated with an additional 80 mg of Demerol and 5 mg of Versed intravenously.  Once the patient was adequately sedate and maintained on low-flow oxygen and continuous cardiac monitoring, the Olympus videocolonoscope was advanced from the rectum to the cecum with difficulty. There was some residual stool in the colon.  Multiple washes were done.  A 5-6 mm sessile polyp was snared from 80 cm.  There were a few scattered diverticula present.  Small internal hemorrhoids were appreciated on retroflexion in the rectum.  The procedure was completed to the cecum and terminal ileum.  No other masses were noted in the cecum, terminal ileum,  or right colon.  IMPRESSION: 1. Small polyp snared from 80 cm. 2. Few scattered diverticula. 3. Small internal hemorrhoids.  RECOMMENDATIONS: 1. Avoid all nonsteroidals including aspirin for the next four weeks. 2. Await pathology results. 3. Outpatient followup in the next two weeks. Dictated by:   Anselmo Rod, M.D. Attending Physician:  Charna Elizabeth DD:  09/05/01 TD:  09/07/01 Job: [id number]

## 2010-07-03 NOTE — Telephone Encounter (Signed)
Spoke w/ pt and she states she needs an order sent to [facility] to have her Oxygen she used prior to having cpap picked up. Pt states she is not using the oxygen at all. Pt states she has been using the cpap at night. Please advise Dr. Maple Hudson. Thanks  Carver Fila, CMA

## 2010-07-03 NOTE — Op Note (Signed)
[place]. [facility]  Patient:    [name], [name] Visit Number: [id number] MRN: [id number]          Service Type: END Location: ENDO Attending Physician:  Charna Elizabeth Dictated by:   Anselmo Rod, M.D. Proc. Date: 09/04/01 Admit Date:  09/04/2001 Discharge Date: 09/04/2001   CC:         Pierce Crane, M.D.  [name], M.D.  Julieanne Manson, M.D.   Operative Report  DATE OF BIRTH:  Oct 24, 2035  PROCEDURE PERFORMED:  Esophagogastroduodenoscopy.  ENDOSCOPIST:  Anselmo Rod, M.D.  INSTRUMENT USED:  Olympus videopanendoscope.  INDICATIONS FOR PROCEDURE:  History of rectal bleeding in a 75 year old white female with a personal history of breast cancer.  Rule out peptic ulcer disease.  The patient has had a history of intermittent abdominal discomfort and pain in the epigastrium.  PREPROCEDURE PHYSICAL:  VITAL SIGNS:  The patient had stable vital signs.  NECK:  Supple.  CHEST:  Clear to auscultation.  Surgical scar present from recent Port-A-Cath removal.  ABDOMEN:  Soft with normal bowel sounds.  DESCRIPTION OF THE PROCEDURE:   The patient was placed in the left lateral decubitus position and sedated with 20 mg of Demerol and 5 mg of Versed intravenously.  Once the patient was adequately sedate and maintained on low-flow oxygen and continuous cardiac monitoring, the Olympus videopanendoscope was advanced through the mouthpiece, over the tongue into the esophagus, and under direct vision, the entire esophagus appeared normal with no evidence of ring, stricture, masses, esophagitis, or Barretts mucosa. The scope was then advanced into the stomach.  The entire gastric mucosa and the proximal small bowel appeared normal.  Hiatal hernia was seen on high retroflexion.  The rest of the exam was unrevealing.  IMPRESSION: 1. Hiatal hernia present. 2. Otherwise normal esophagogastroduodenoscopy.  RECOMMENDATIONS:  Proceed with a  colonoscopy at this time. Dictated by:   Anselmo Rod, M.D. Attending Physician:  Charna Elizabeth DD:  09/05/01 TD:  09/07/01 Job: [id number]

## 2010-07-03 NOTE — Op Note (Signed)
NAME:  [name], [name]                 ACCOUNT NO.:  [id number]   MEDICAL RECORD NO.:  [id number]          PATIENT TYPE:  AMB   LOCATION:  ENDO                         FACILITY:  [facility]   PHYSICIAN:  Anselmo Rod, M.D.  DATE OF BIRTH:  Dec 12, 1935   DATE OF PROCEDURE:  09/21/2004  DATE OF DISCHARGE:                                 OPERATIVE REPORT   PROCEDURE PERFORMED:  Colonoscopy with snare polypectomy times three.   ENDOSCOPIST:  Charna Elizabeth, M.D.   INSTRUMENT USED:  Olympus video colonoscope.   INDICATIONS FOR PROCEDURE:  The patient is a 75 year old female with a  personal history of breast cancer status post mastectomy in the past and a  personal history of adenomatous polyps undergoing surveillance colonoscopy  rule out recurrent polyps.   PREPROCEDURE PREPARATION:  Informed consent was procured from the patient.  The patient was fasted for eight hours prior to the procedure and prepped  with a bottle of magnesium citrate and a gallon of GoLYTELY the night prior  to the procedure.  The risks and benefits of the procedure including a 10%  miss rate for colon polyps or cancers was discussed with the patient as  well.   PREPROCEDURE PHYSICAL:  The patient had stable vital signs.  Neck supple.  Chest clear to auscultation.  S1 and S2 regular.  Abdomen soft with normal  bowel sounds.   DESCRIPTION OF PROCEDURE:  The patient was placed in left lateral decubitus  position and sedated with an additional 20 mg of Demerol and 2.5 mg of  Versed in slow incremental doses.  Once the patient was adequately sedated  and maintained on low flow oxygen and continuous cardiac monitoring, the  Olympus video colonoscope was advanced from the rectum to the cecum without  difficulty.  The patient's position had to be changed from the left lateral  to the supine position with gentle application of abdominal pressure so that  the cecum could be reached.  There was a large amount of residual stool  in  the colon and multiple washes were done.  Three small sessile polyps were  removed by cold snare from the rectosigmoid colon.  Small internal  hemorrhoids were seen on retroflexion.  There was no evidence of  diverticulosis.  No other masses or polyps were identified.  The patient  tolerated the procedure well without complication.   IMPRESSION:  1.Small nonbleeding internal hemorrhoids.  2.Three small sessile polyps removed by cold snare from rectosigmoid colon.  3.Large amount of residual stool in the colon.  Multiple washes done.  Small  lesions could have been missed.   RECOMMENDATIONS:  1.Await pathology results.  2.Avoid all nonsteroidals including aspirin for the next two weeks.  3.Outpatient followup as need arises in the future.  1.  Repeat colonoscopy depending on pathology results.       JNM/MEDQ  D:  09/21/2004  T:  09/21/2004  Job:  [id number]   cc:   Lovenia Kim, D.O.  [address]  [place]  [place] [address]  Fax: [phone]   Pierce Crane, M.D.  [address] [facility]  [place]  [place] [address]  Fax: [phone]

## 2010-07-03 NOTE — Telephone Encounter (Signed)
Lmomtcb. Order placed to D/C oxygen.

## 2010-07-03 NOTE — Op Note (Signed)
[place]. [facility]  Patient:    LABREA, ECCLESTON Visit Number: [id number] MRN: [id number]          Service Type: DSU Location: [phone] Attending Physician:  Charlton Haws Dictated by:   [name], M.D. Proc. Date: 01/03/01 Admit Date:  01/03/2001   CC:         Gretta Cool, M.D.  [facility] L. Little, M.D.  Cheryl A. Radene Journey, M.D.   Operative Report  CCS# [id number]  PREOPERATIVE DIAGNOSIS:  Carcinoma of right breast x 2, probably lobular.  POSTOPERATIVE DIAGNOSIS:  Carcinoma of right breast x 2, probably lobular.  OPERATION PERFORMED:  Right total mastectomy with blue dye injection and sentinel lymph node dissection.  SURGEON:  [name], M.D.  ASSISTANT:  Zigmund Daniel, M.D.  ANESTHESIA:  General endotracheal.  INDICATIONS FOR PROCEDURE:  The patient is a 75 year old lady who has had a recent mammogram and ultrasound which showed several lesions in the right breast too, which on biopsy were carcinoma.  She had had some abnormalities noted previously but not really seen so well on mammography until these current films.  I saw her following the films and after evaluation of her situation, recommended that she proceed to mastectomy because of multifocal breast cancer probably lobular in origin.  DESCRIPTION OF PROCEDURE:  The patient was seen in the holding area and had no further questions.  She was injected previously with her radioactive dye.  The patient was taken to the operating room and the right breast was prepped and draped as a sterile field.  I used the Neoprobe to mark the area in the axilla that appeared to be hot and then outlined an elliptical incision in the breast.  The superior incision was made first and the superior flap raised medially just across the midline superiorly to the clavicle, lateral until we got into the latissimus and into the axilla.  Once  after the skin had been elevated, I used the Neoprobe again and found an area that was hot, divided the tissues and found first what appeared to be a soft but enlarged node which I grasped.  It was quite hot with counts around 700 or 800 and a little blue lymphatic leading into it that had not yet turned blue.  This was taken and sent as sentinel node #1.  There was another hot area adjacent and a little more dissection showed a bright blue lymph node that was smaller than the first and I had counts of around 2700 and this was also removed.  With those two out, counts dropped down to background ranges of about 30 or less.  This completed the axillary sampling and the inferior incision was then made.  The inferior flap was raised in the usual fashion going to the inframammary fold and to the latissimus.  The breast was removed from the underlying muscle with the cautery to reach the axilla and was elevated up off of the clavipectoral fascia and the remaining attachments divided.  Because of where the node dissection had occurred, I think I had some of the level 1 nodes out with the specimen.  There were no other hot areas, blue areas noted in the axilla and no palpable enlarged or pathologic nodes nodes.  The wound was copiously irrigated, carefully checked for hemostasis which took several minutes.   While we were waiting on pathology to report on sentinel nodes.  Drains were  placed using two 19 Blakes.  Left a little pack and then waited several minutes and then when pathology reported that they were negative, went back, made a last check and everything appeared to be dry at this point with no bleeding having occurred while we were waiting for the reports.  The skin was closed with staples.  The drains had been secured with nylon sutures.  The patient tolerated the procedure well.  There were no operative complications.  All counts were correct.  Estimated blood loss for this procedure  was about 150 cc. Dictated by:   [name], M.D. Attending Physician:  Charlton Haws DD:  01/03/01 TD:  01/03/01 Job: [id number]

## 2010-07-03 NOTE — Telephone Encounter (Signed)
[facility]- D/C home oxygen. Continue CPAP

## 2010-07-03 NOTE — Op Note (Signed)
[name], [name]                 ACCOUNT NO.:  [id number]   MEDICAL RECORD NO.:  [id number]          PATIENT TYPE:  AMB   LOCATION:  ENDO                         FACILITY:  [facility]   PHYSICIAN:  Anselmo Rod, M.D.  DATE OF BIRTH:  [birth year]   DATE OF PROCEDURE:  09/21/2004  DATE OF DISCHARGE:                                 OPERATIVE REPORT   PROCEDURE PERFORMED:  Esophagogastroduodenoscopy.   ENDOSCOPIST:  Charna Elizabeth, M.D.   INSTRUMENT USED:  Olympus video panendoscope.   INDICATIONS FOR PROCEDURE:  Chest pain and epigastric discomfort in a 75-  year-old white female.  Rule out peptic ulcer disease, esophagitis,  gastritis, etc.   PREPROCEDURE PREPARATION:  Informed consent was procured from the patient.  The patient was fasted for eight hours prior to the procedure.   PREPROCEDURE PHYSICAL:  The patient had stable vital signs.  Neck supple,  chest clear to auscultation.  S1, S2 regular.  Abdomen soft with normal  bowel sounds.   DESCRIPTION OF PROCEDURE:  The patient was placed in the left lateral  decubitus position and sedated with 50 mg of Demerol and 5 mg of Versed in  slow incremental doses.  Once the patient was adequately sedated and  maintained on low-flow oxygen and continuous cardiac monitoring, the Olympus  video panendoscope was advanced through the mouth piece over the tongue into  the esophagus under direct vision.  The entire esophagus appeared normal  with no evidence of ring, stricture, masses, esophagitis or Barrett's  mucosa.  The scope was then advanced to the stomach.  A small hiatal hernia  was seen on high retroflexion.  The rest of the gastric mucosa and proximal  small  bowel appeared normal.   IMPRESSION:  Small hiatal hernia, otherwise normal  esophagogastroduodenoscopy.   RECOMMENDATIONS:  1.Continue antireflux measures and use of proton pump  inhibitor as needed.  2.Avoid all nonsteroidals.  3.Proceed with colonoscopy at this time.   Further recommendations will be  made thereafter.       JNM/MEDQ  D:  09/21/2004  T:  09/21/2004  Job:  [id number]   cc:   Lovenia Kim, D.O.  [address]  [place]  [place] [address]  Fax: [phone]   Pierce Crane, M.D.  [address] [facility]  [place]  [place] [address]  Fax: [phone]

## 2010-07-03 NOTE — Op Note (Signed)
[place]  Patient:    [name], [name] Visit Number: [id number] MRN: [id number]          Service Type: DSU Location: [facility] Attending Physician:  Charlton Haws Dictated by:   [name], M.D. Proc. Date: 03/30/01 Admit Date:  03/30/2001   CC:         Pierce Crane, M.D.                           Operative Report  ACCOUNT NO. [id number]. CCS [id number].  PREOPERATIVE DIAGNOSIS:  Nonfunctioning left-sided Port-A-Cath.  POSTOPERATIVE DIAGNOSIS:  Nonfunctioning left-sided Port-A-Cath.  PROCEDURES: 1. Removal of left Port-A-Cath with attempted replacement on the left, not    successful. 2. Placement of right-sided Port-A-Cath.  SURGEON:  [name], M.D.  ANESTHESIA:  General.  CLINICAL HISTORY:  This patient is a 75 year old who had a left-sided Port-A-Cath placed, which initially appeared to work but then on attempted access, no blood could be withdrawn and radiographic studies appeared to show that the tip had backed up somewhat and had also developed a sheath around the end.  It was therefore no longer usable for chemotherapy, and she needed better IV access.  DESCRIPTION OF PROCEDURE:  The patient was seen in the holding area and the situation reviewed again with her.  The plan was to try to replace the left-sided catheter but if not possible, would need to have an all-new Port-A-Cath placed on the right.  The patient was taken to the operating room and after satisfactory general anesthesia had been obtained, the entire upper chest and lower neck area was prepped and draped as a single sterile field.  I opened the old Port-A-Cath reservoir site and identified the Port-A-Cath and disconnected the tubing from the reservoir.  There was a little blood that would back up from this.  I clamped it off temporarily and then made a secondary incision in the left infraclavicular area, manipulated it down to the tubing, and  was able to then pull the tubing from the reservoir site into the entry site.  I threaded a guidewire, which went readily through the tubing, and it was not occluded. However, with multiple attempts I was unable to get the guidewire to go any place other than across the midline or up into the right internal jugular and could never get it to go downstream into the superior vena cava.  I tried many different positions with the patients head, twisting the catheter, backing it up, etc.  At this point I decided to stop this attempt and withdrew the catheter.  There was no bleeding.  I then accessed the left subclavian vein with a fresh stick, and this went readily.  The guidewire was again placed easily but, unfortunately, continued to only go across or up, primarily up into the right internal jugular.  Again after several minutes of attempting to manipulate this, I abandoned this attempt.  Attention was then turned to the right side and the right subclavian vein entered on initial stick.  The guidewire threaded easily and went directly into the superior vena cava down toward the right atrium.  Once this was placed, I went ahead and made a new incision in the right anterior chest wall for placement of the reservoir and formed a pocket.  A new set of tubing was used and pulled between the two areas.  The guidewire tract was dilated once with  a #10 dilator, followed by the 10 dilator and peel-away sheath.  I checked with the fluoroscopy to make sure these were positioned well and then removed the dilator and guidewire.  The catheter threaded easily and actually got down into the right ventricle.  Using fluoroscopy, it was backed up so that it appeared to be right at the junction of the right atrium-superior vena cava, and aspirated and irrigated easily.  The reservoir was flushed, and the two were attached together.  They aspirated and irrigated easily.  The reservoir was sutured to the fascia.   The incision was closed.  It aspirated and irrigated again easily.  I then closed the incision with 3-0 Vicryl, followed by 4-0 Monocryl.  Fluoroscopy showed good positioning remaining with no kinks and the tip apparently still in good position in the superior vena cava.  The left-sided incisions were closed in layers with 3-0 Vicryl and 4-0 Monocryl.  The Port-A-Cath was accessed again and again aspirated and irrigated easily.  It was flushed with dilute heparin, followed by 6 cc of concentrated aqueous heparin, and access left for tomorrows chemotherapy.  The patient tolerated the procedure well.  There were no operative complications.  All counts were correct. Dictated by:   [name], M.D. Attending Physician:  Charlton Haws DD:  03/30/01 TD:  03/30/01 Job: [id number]

## 2010-07-06 ENCOUNTER — Ambulatory Visit (HOSPITAL_COMMUNITY): Payer: Medicare Other

## 2010-07-06 ENCOUNTER — Other Ambulatory Visit (HOSPITAL_COMMUNITY): Payer: Medicare Other

## 2010-07-06 ENCOUNTER — Encounter (HOSPITAL_COMMUNITY): Payer: Medicare Other

## 2010-07-06 NOTE — Telephone Encounter (Signed)
Pt states [facility] said she may need to use her oxygen with cpap at night and wants [place] to check with [facility] regarding this. Pls advise if pt needs to have o2 bled into cpap.

## 2010-07-06 NOTE — Telephone Encounter (Signed)
Please order an overnight oximetry on CPAP with room air so we can decide if she still needs home O2.

## 2010-07-07 NOTE — Telephone Encounter (Signed)
Order placed and sent to [facility]. Jennifer Castillo, CMA  

## 2010-07-08 ENCOUNTER — Telehealth: Payer: Self-pay | Admitting: Cardiology

## 2010-07-08 ENCOUNTER — Telehealth: Payer: Self-pay | Admitting: Internal Medicine

## 2010-07-08 NOTE — Telephone Encounter (Signed)
Called, spoke with pt.  States she still has not heard anything regarding o2 and cpap.  Per phone message from 07/02/10, Waymon Budge, MD 07/06/2010 9:30 PM Signed  Please order an overnight oximetry on CPAP with room air so we can decide if she still needs home O2.   Order was placed yesterday.     Pt is aware [facility] would like her to have ONO on cpap with RA so we can decide this.  She is aware order was placed yesterday and she should be hearing something from [facility] to set this up.  She verbalized understanding of this.

## 2010-07-08 NOTE — Telephone Encounter (Signed)
Pt wants to know if she can have some lipitor samples. Pt needs lipitor.

## 2010-07-09 ENCOUNTER — Telehealth: Payer: Self-pay | Admitting: Cardiology

## 2010-07-09 NOTE — Telephone Encounter (Signed)
Left a message to call back.

## 2010-07-09 NOTE — Telephone Encounter (Signed)
Pt would like to talk to nurse re her blood pressure and her meds.

## 2010-07-10 NOTE — Telephone Encounter (Signed)
Patient states when she called yesterday, she had miss placed her blood pressure medication. She found it today,so she is fine now.

## 2010-07-13 ENCOUNTER — Emergency Department (HOSPITAL_COMMUNITY): Payer: Medicare Other

## 2010-07-13 ENCOUNTER — Inpatient Hospital Stay (HOSPITAL_COMMUNITY)
Admission: EM | Admit: 2010-07-13 | Discharge: 2010-07-16 | DRG: 287 | Disposition: A | Discharge status: Home Health | Payer: Medicare Other | Attending: Cardiology | Admitting: Cardiology

## 2010-07-13 ENCOUNTER — Other Ambulatory Visit: Payer: Self-pay | Admitting: Cardiovascular Disease

## 2010-07-13 DIAGNOSIS — M1990 Unspecified osteoarthritis, unspecified site: Secondary | ICD-10-CM | POA: Diagnosis present

## 2010-07-13 DIAGNOSIS — Z7982 Long term (current) use of aspirin: Secondary | ICD-10-CM

## 2010-07-13 DIAGNOSIS — R0789 Other chest pain: Principal | ICD-10-CM | POA: Diagnosis present

## 2010-07-13 DIAGNOSIS — I2510 Atherosclerotic heart disease of native coronary artery without angina pectoris: Secondary | ICD-10-CM | POA: Diagnosis present

## 2010-07-13 DIAGNOSIS — M549 Dorsalgia, unspecified: Secondary | ICD-10-CM | POA: Diagnosis present

## 2010-07-13 DIAGNOSIS — I10 Essential (primary) hypertension: Secondary | ICD-10-CM | POA: Diagnosis present

## 2010-07-13 DIAGNOSIS — F341 Dysthymic disorder: Secondary | ICD-10-CM | POA: Diagnosis present

## 2010-07-13 DIAGNOSIS — R079 Chest pain, unspecified: Secondary | ICD-10-CM

## 2010-07-13 DIAGNOSIS — G4733 Obstructive sleep apnea (adult) (pediatric): Secondary | ICD-10-CM | POA: Diagnosis present

## 2010-07-13 DIAGNOSIS — Z9861 Coronary angioplasty status: Secondary | ICD-10-CM

## 2010-07-13 DIAGNOSIS — Z853 Personal history of malignant neoplasm of breast: Secondary | ICD-10-CM

## 2010-07-13 DIAGNOSIS — E785 Hyperlipidemia, unspecified: Secondary | ICD-10-CM | POA: Diagnosis present

## 2010-07-13 LAB — BASIC METABOLIC PANEL
Calcium: 8.9 mg/dL (ref 8.4–10.5)
Chloride: 104 mEq/L (ref 96–112)
Creatinine, Ser: 0.85 mg/dL (ref 0.4–1.2)
GFR calc Af Amer: >60 mL/min (ref >60)
GFR calc non Af Amer: >60 mL/min (ref >60)

## 2010-07-13 LAB — DIFFERENTIAL
Basophils Relative: 0 % (ref 0–1)
Eosinophils Absolute: 0.2 10*3/uL (ref 0.0–0.7)
Neutrophils Relative %: 77 % (ref 43–77)

## 2010-07-13 LAB — CBC
Hemoglobin: 13 g/dL (ref 12.0–15.0)
MCH: 28 pg (ref 26.0–34.0)
RBC: 4.65 MIL/uL (ref 3.87–5.11)
WBC: 11.2 10*3/uL — ABNORMAL HIGH (ref 4.0–10.5)

## 2010-07-13 LAB — TROPONIN I: Troponin I: <0.3 ng/mL (ref <0.30)

## 2010-07-13 LAB — CK TOTAL AND CKMB (NOT AT ARMC): CK, MB: 1.6 ng/mL (ref 0.3–4.0)

## 2010-07-13 LAB — PRO B NATRIURETIC PEPTIDE: Pro B Natriuretic peptide (BNP): 71.1 pg/mL (ref 0–125)

## 2010-07-13 LAB — D-DIMER, QUANTITATIVE: D-Dimer, Quant: 6.53 ug/mL-FEU — ABNORMAL HIGH (ref 0.00–0.48)

## 2010-07-13 MED ORDER — IOHEXOL 300 MG/ML  SOLN
100.0000 mL | Freq: Once | INTRAMUSCULAR | Status: AC | PRN
  Administered 2010-07-13: 100 mL via INTRAVENOUS
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
## 2010-07-14 ENCOUNTER — Telehealth: Payer: Self-pay | Admitting: Internal Medicine

## 2010-07-14 ENCOUNTER — Telehealth: Payer: Self-pay | Admitting: Physician Assistant

## 2010-07-14 DIAGNOSIS — I517 Cardiomegaly: Secondary | ICD-10-CM

## 2010-07-14 LAB — CARDIAC PANEL(CRET KIN+CKTOT+MB+TROPI)
Relative Index: INVALID (ref 0.0–2.5)
Troponin I: <0.3 ng/mL (ref <0.30)

## 2010-07-14 LAB — LIPID PANEL
Total CHOL/HDL Ratio: 3.4 RATIO
VLDL: 22 mg/dL (ref 0–40)

## 2010-07-14 LAB — TROPONIN I: Troponin I: <0.3 ng/mL (ref <0.30)

## 2010-07-14 LAB — PROTIME-INR
INR: 0.97 (ref 0.00–1.49)
Prothrombin Time: 13.1 seconds (ref 11.6–15.2)

## 2010-07-14 LAB — CK TOTAL AND CKMB (NOT AT ARMC)
CK, MB: 1.1 ng/mL (ref 0.3–4.0)
CK, MB: 1.1 ng/mL (ref 0.3–4.0)
Relative Index: INVALID (ref 0.0–2.5)
Total CK: 31 U/L (ref 7–177)
Total CK: 34 U/L (ref 7–177)
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
## 2010-07-14 NOTE — Telephone Encounter (Signed)
Spoke with pt and she is not sure when she will be discharged so I advised her to call either [facility] to reschedule or call our office and asked for [facility] to reschedule. Carron Curie, CMA

## 2010-07-14 NOTE — Telephone Encounter (Signed)
ATC x 2 line busy  

## 2010-07-15 ENCOUNTER — Telehealth: Payer: Self-pay | Admitting: *Deleted

## 2010-07-15 DIAGNOSIS — R079 Chest pain, unspecified: Secondary | ICD-10-CM

## 2010-07-15 LAB — CBC
HCT: 41.7 % (ref 36.0–46.0)
MCH: 27.1 pg (ref 26.0–34.0)
MCV: 82.4 fL (ref 78.0–100.0)
Platelets: 328 10*3/uL (ref 150–400)
RDW: 15.3 % (ref 11.5–15.5)

## 2010-07-15 LAB — BASIC METABOLIC PANEL
BUN: 12 mg/dL (ref 6–23)
Chloride: 104 mEq/L (ref 96–112)
Creatinine, Ser: 0.79 mg/dL (ref 0.4–1.2)
GFR calc non Af Amer: >60 mL/min (ref >60)
Glucose, Bld: 100 mg/dL — ABNORMAL HIGH (ref 70–99)
Potassium: 4.3 mEq/L (ref 3.5–5.1)

## 2010-07-15 NOTE — Telephone Encounter (Signed)
ATC x2 but no anwer. I checked Echart and pt is still in room #2028 but again no answer. Carron Curie, CMA

## 2010-07-15 NOTE — Telephone Encounter (Signed)
See telephone note.

## 2010-07-16 ENCOUNTER — Encounter: Payer: Self-pay | Admitting: Internal Medicine

## 2010-07-16 LAB — BASIC METABOLIC PANEL
BUN: 9 mg/dL (ref 6–23)
CO2: 24 mEq/L (ref 19–32)
Calcium: 9.2 mg/dL (ref 8.4–10.5)
Chloride: 106 mEq/L (ref 96–112)
Creatinine, Ser: 0.72 mg/dL (ref 0.4–1.2)
GFR calc Af Amer: >60 mL/min (ref >60)

## 2010-07-16 LAB — CBC
MCH: 27.2 pg (ref 26.0–34.0)
MCV: 82.8 fL (ref 78.0–100.0)
Platelets: 297 10*3/uL (ref 150–400)
RBC: 4.89 MIL/uL (ref 3.87–5.11)
RDW: 15.3 % (ref 11.5–15.5)

## 2010-07-17 ENCOUNTER — Other Ambulatory Visit: Payer: Self-pay | Admitting: Internal Medicine

## 2010-07-17 DIAGNOSIS — G4733 Obstructive sleep apnea (adult) (pediatric): Secondary | ICD-10-CM

## 2010-07-17 NOTE — Progress Notes (Signed)
Patient turned in autotitration for CPAP un-used. She wants to keep O2 but not CPAP.

## 2010-07-20 ENCOUNTER — Ambulatory Visit (HOSPITAL_COMMUNITY): Payer: Medicare Other

## 2010-07-20 ENCOUNTER — Encounter (HOSPITAL_COMMUNITY): Payer: Medicare Other

## 2010-07-21 ENCOUNTER — Telehealth: Payer: Self-pay | Admitting: Internal Medicine

## 2010-07-21 NOTE — Telephone Encounter (Signed)
Spoke with pt.  She states that Dois Davenport with [facility] called her and left a msg stating that they are going to come and take her CPAP machine away from her since she is not using. She states that she does use her CPAP every night, although she does take it off sometimes unknowingly. I advised pt that I will call [facility] to see what the problem is. I called and LMOVM for Dois Davenport with [facility] to return my call.

## 2010-07-22 ENCOUNTER — Telehealth: Payer: Self-pay | Admitting: Cardiology

## 2010-07-22 NOTE — Telephone Encounter (Signed)
Called, spoke with pt.  States she was told order was placed on 6/1 to d/c cpap by her choice.  However, pt states she never said this or wanted to d/c the cpap.  I do see an order in for this.  I called Dois Davenport with [facility] to clarify what is going.  Pt aware we will call back once this has been figured out and clarified.

## 2010-07-22 NOTE — Cardiovascular Report (Signed)
[name], [name]                 ACCOUNT NO.:  [id number]  MEDICAL RECORD NO.:  [id number]           PATIENT TYPE:  O  LOCATION:  6523                         FACILITY:  [facility]  PHYSICIAN:  Lorine Bears, MD     DATE OF BIRTH:  08/10/1935  DATE OF PROCEDURE:  07/15/2010 DATE OF DISCHARGE:                           CARDIAC CATHETERIZATION   PRIMARY CARDIOLOGIST:  [name], MD, [facility]  PROCEDURES PERFORMED: 1. Left heart catheterization. 2. Coronary angiography. 3. Left ventricular angiography.  ACCESS:  Right radial artery.  INDICATION/CLINICAL HISTORY:  This is a 75 year old female with known history of coronary artery disease status post angioplasty and drug- eluting stent placement to the proximal LAD in May 2011.  She presented with the symptoms of chest and back pain at rest which was worrisome for possible unstable angina given her previous cardiac history.  She ruled out for myocardial infarction.  She continued to have recurrent chest pain while in the hospital.  Thus, cardiac catheterization and possible coronary intervention was recommended.  Risks, benefits, and alternatives were discussed with the patient.  STUDY DETAILS:  A standard informed consent was obtained.  The right radial artery was prepped in a sterile fashion.  It was anesthetized with 1% lidocaine.  A 5-French sheath was placed in the right radial artery after an anterior puncture.  3 mg of verapamil was given through the sheath.  5000 units of unfractionated heparin was given intravenously.  I attempted angiography with a TIG catheter, but there was significant tortuosity in the innominate with inability to torque the catheter even with a wire.  Thus, I engage the left main coronary artery with a JL-3.5 catheter.  AR-1 was used for the right coronary artery and a pigtail catheter was used for left ventricular angiography. The patient tolerated the procedure well.  The sheath was removed at  the end of the case.  A TR band was applied.  STUDY FINDINGS:  Hemodynamic findings:  Central aortic pressure is 150/83 with a mean pressure of 112 mmHg.  Left ventricular pressure is 144/2 with a left ventricular end-diastolic pressure of 20 mmHg.  Left ventricular angiography:  This showed normal LV systolic function and wall motion with an estimated ejection fraction of 60%.  Coronary angiography: Left main coronary artery:  The vessel is normal in size and free of significant disease.  Left anterior descending artery:  The vessel is normal in size.  There is a stent noted in the proximal and extending to the mid segment.  The stent is patent with no significant restenosis.  The rest of the LAD is free of significant disease.  First diagonal is medium in size and free of significant disease.  Second diagonal is normal in size without obstructive disease.  Third diagonal is a small branch.  Left circumflex artery:  The vessel is normal in size and nondominant. It is overall free of any significant disease.  Right coronary artery:  The vessel is normal in size and dominant.  It was difficult to engage, but ultimately I was able to do nonselective angiography.  The vessel is free  of any significant disease.  STUDY CONCLUSIONS: 1. No evidence of obstructive coronary artery disease.  Patent stent     in the left anterior descending artery with no significant     restenosis. 2. Normal LV systolic function. 3. Mildly elevated systemic pressure and left ventricular end-     diastolic pressure which is likely due to diastolic dysfunction.  RECOMMENDATIONS:  Recommend continuing medical therapy for coronary artery disease.     Lorine Bears, MD     MA/MEDQ  D:  07/15/2010  T:  07/16/2010  Job:  [id number]  cc:   [name], MD, [facility]  Electronically Signed by Lorine Bears MD on 07/22/2010 09:55:18 AM

## 2010-07-22 NOTE — Telephone Encounter (Signed)
Spoke with pt, questions answered [name]  

## 2010-07-22 NOTE — Telephone Encounter (Signed)
Patient called back stated that she spoke to someone in our office yesterday and they told her that when she was seen on 6/1 she advised that she didn't want to use cpap any longer. Patient was last seen 5/8 she and Dr Maple Hudson decided for her to go back on the Cpap due to sleep apena. Patient can be reached at [phone].[place] Nickel

## 2010-07-22 NOTE — Telephone Encounter (Signed)
Pt called and wanted to speak to you regarding an appointment.  She did not want to discuss anything with me.

## 2010-07-22 NOTE — Telephone Encounter (Signed)
We got a note from [place] [facility] noting confusion about CPAP and O2., so I called. [name] realizes she needs CPAP based on her sleep study. Oximeter was dropped off by a driver who didn't know about instruction re O2 or CPAP. Last night she wore her CPAP, room air, ONOX as I intended. She is ok with leaving her O2 in her home until we get that download and can explain to her if she needs both CPAP and O2.

## 2010-07-23 NOTE — Discharge Summary (Signed)
[name], [name]                 ACCOUNT NO.:  [id number]  MEDICAL RECORD NO.:  [id number]           PATIENT TYPE:  O  LOCATION:  6523                         FACILITY:  [facility]  PHYSICIAN:  [name] Frieze. [name], MD, FACCDATE OF BIRTH:  April 18, 1935  DATE OF ADMISSION:  07/13/2010 DATE OF DISCHARGE:  07/16/2010                              DISCHARGE SUMMARY   PRIMARY CARDIOLOGIST:  [name] Frieze. [name], MD, [facility]  PRIMARY CARE PROVIDER:  Larina Earthly, MD  DISCHARGE DIAGNOSIS:  Chest pain without objective evidence of ischemia.  SECONDARY DIAGNOSES: 1. Coronary artery disease status post prior left anterior descending     drug-eluting stent placement in May 2011 with patent coronary     arteries this admission. 2. Hypertension. 3. Hyperlipidemia. 4. Osteoarthritis. 5. Obstructive sleep apnea. 6. History of anxiety with panic disorder. 7. Hiatal hernia noted on EGD in 2011. 8. History of breast cancer status post right mastectomy. 9. History of atypical chest pain.  ALLERGIES:  Fatigue with BETA BLOCKERS.  PROCEDURES: 1. 2-D echocardiogram, Jul 14, 2010 showing an ejection fraction of 60-     65% with grade 1 diastolic dysfunction.  There was trivial mitral     regurgitation.  Normal RV systolic function with a PA pressure of     25 mmHg. 2. Left heart cardiac catheterization performed on Jul 15, 2010     showing patent left anterior descending stent with otherwise normal     coronary arteries and an ejection fraction of 60%.  HISTORY OF PRESENT ILLNESS:  This is a 75 year old female with prior history of coronary artery disease status post LAD stenting in 2011 who was in her usual state of health until the morning of admission when she developed diffuse back pain relieved by nitroglycerin.  She had recurrence of discomfort lasting a few minutes after lunch again treated with nitroglycerin.  Because of recurrent symptoms, the patient presented to the [facility] ED at this time  complaining of chest pressure.  She was treated with 2 sublingual nitroglycerin and GI cocktail with resolution of discomfort.  ECG showed no acute changes and point-of-care markers were negative.  She was admitted for further evaluation.  HOSPITAL COURSE:  The patient ruled out for MI.  It was felt that her symptoms were primarily atypical, however, given her prior cardiac history a decision was made to pursue catheterization.  Catheterization was performed on Jul 15, 2010 showing patent LAD stent, otherwise normal coronary arteries and normal LV function.  She also underwent 2-D echocardiogram with results as above.  The patient has had no recurrence of chest pain and will be discharged home today in good condition.  DISCHARGE LABORATORY DATA:  Hemoglobin 13.3, hematocrit 40.5, WBC 8.2, and platelets 297.  INR 0.97.  Sodium 139, potassium 4.0, chloride 106, CO2 24, BUN 9, creatinine 0.72, glucose 112, and calcium 9.2.  CK 32, MB 1.1, and troponin-I less than 0.30.  Total cholesterol 121, triglycerides 109, HDL 36, and LDL 63.  DISPOSITION:  The patient will be discharged home today in good condition.  FOLLOWUP PLANS AND APPOINTMENTS:  We will ask the  patient to follow up with her primary care provider in the next 1-2 weeks.  She is to follow up with Dr. Jens [name] on August 17, 2010 at 10:00 a.m.  DISCHARGE MEDICATIONS: 1. Nitroglycerin 0.4 mg sublingual p.r.n. chest pain. 2. Amitriptyline 50 mg 2 tablets at bedtime. 3. Arimidex 1 mg daily. 4. Aspirin 81 mg daily. 5. Calcium citrate plus D 2 tablets daily. 6. Cinnamon 500 mg 3 capsules daily. 7. Fish oil 1000 mg daily. 8. Lipitor 40 mg daily. 9. Lorazepam 2 mg b.i.d. p.r.n. 10.Plavix 75 mg daily. 11.Quinapril 20 mg daily. 12.Systane eye drops both eyes daily p.r.n.  OUTSTANDING LABORATORY STUDIES:  None.  DURATION OF DISCHARGE ENCOUNTER:  40 minutes including physician time.     [name],  ANP   ______________________________ [name] Frieze. [name], MD, [facility]    CB/MEDQ  D:  07/16/2010  T:  07/17/2010  Job:  [id number]  cc:   [name], M.D.  Electronically Signed by [name] ANP on 07/21/2010 08:08:17 PM Electronically Signed by Olga Millers MD [facility] on 07/23/2010 10:03:52 AM

## 2010-07-27 ENCOUNTER — Other Ambulatory Visit (HOSPITAL_COMMUNITY): Payer: Medicare Other

## 2010-07-27 ENCOUNTER — Telehealth: Payer: Self-pay | Admitting: Internal Medicine

## 2010-07-27 DIAGNOSIS — G4730 Sleep apnea, unspecified: Secondary | ICD-10-CM

## 2010-07-27 NOTE — Telephone Encounter (Signed)
Oxygenation overnight was good enough on CPAP without oxygen.     Order- [facility]  [facility]- keep CPAP but D/C oxygen. I think that is what she wants to do.

## 2010-07-27 NOTE — Telephone Encounter (Signed)
Dr Maple Hudson, have you read ONO results yet? Pls advise, thanks!

## 2010-07-28 ENCOUNTER — Telehealth: Payer: Self-pay | Admitting: Cardiology

## 2010-07-28 NOTE — Telephone Encounter (Signed)
Pt having swelling in her left foot pt wants to talk to a nurse.

## 2010-07-28 NOTE — Telephone Encounter (Signed)
ATC x 2 line was busy, WCB 

## 2010-07-28 NOTE — Telephone Encounter (Signed)
.   Called and spoke with pt.  Pt aware of ONO results and Cy's recs.  Pt aware order will be sent to [facility] to d/c oxygen. Pt verbalized understanding and denied any questions.

## 2010-07-28 NOTE — Telephone Encounter (Signed)
Attempted to call pt. Phone sound busy.

## 2010-07-29 ENCOUNTER — Telehealth: Payer: Self-pay | Admitting: Internal Medicine

## 2010-07-29 NOTE — Telephone Encounter (Signed)
lmtcb

## 2010-07-29 NOTE — Telephone Encounter (Signed)
ATC pt x 3. Line busy. WCB.  

## 2010-07-30 NOTE — Telephone Encounter (Signed)
[id number] 

## 2010-07-31 ENCOUNTER — Telehealth: Payer: Self-pay | Admitting: Internal Medicine

## 2010-07-31 DIAGNOSIS — G4700 Insomnia, unspecified: Secondary | ICD-10-CM

## 2010-07-31 NOTE — Telephone Encounter (Signed)
Spoke with pt.  She does not understand why [facility] chose to take her o2 away, she thought she needed this in addition to CPAP.  I explained to her that we did test to prove that her o2 levels are okay with CPAP only and this is the reason why she does not need the o2. Pt verbalized understanding.

## 2010-07-31 NOTE — Telephone Encounter (Signed)
Returned call. Wants nurse to call back "soon" this am. Hazel Sams

## 2010-07-31 NOTE — Telephone Encounter (Signed)
Order has been placed.

## 2010-07-31 NOTE — Telephone Encounter (Signed)
Please order CPAP download for pressure check- [facility].

## 2010-07-31 NOTE — Telephone Encounter (Signed)
Called and spoke with pt.  Pt states she is still waiting on [facility] to come to her home to download her cpap machine.  I don't see an order for this.  CY, please advise.  Pt states [facility] was supposed to do a download for her so we can adjust her cpap pressures.

## 2010-07-31 NOTE — Telephone Encounter (Signed)
Pt rtn your call regarding swelling of feet

## 2010-08-03 ENCOUNTER — Encounter: Payer: Self-pay | Admitting: Internal Medicine

## 2010-08-03 ENCOUNTER — Ambulatory Visit (HOSPITAL_COMMUNITY): Payer: Medicare Other

## 2010-08-03 ENCOUNTER — Encounter (HOSPITAL_COMMUNITY): Payer: Medicare Other

## 2010-08-05 ENCOUNTER — Telehealth: Payer: Self-pay | Admitting: Internal Medicine

## 2010-08-05 NOTE — Telephone Encounter (Signed)
ATC pt at home # x 3.  Line busy.  WCB.  FYI: order was already sent to [facility] on 07/31/10  to obtain a download of pt's cpap machine.

## 2010-08-06 ENCOUNTER — Telehealth: Payer: Self-pay | Admitting: Internal Medicine

## 2010-08-06 NOTE — Telephone Encounter (Signed)
Called and spoke with pt.  Pt aware of the information below.  Pt verbalized understanding and denied any further questions.

## 2010-08-06 NOTE — Telephone Encounter (Signed)
This is a duplicate message.  Will sign off. 

## 2010-08-06 NOTE — Telephone Encounter (Signed)
I called [facility] and they did the download and faxed it to  Dr. Maple Hudson on Monday. They did this electronically because the pt has the modem on her machine for them to do the downloads remotely. I have [id number] to advise the pt. Carron Curie, CMA

## 2010-08-11 ENCOUNTER — Telehealth: Payer: Self-pay | Admitting: Internal Medicine

## 2010-08-11 NOTE — Telephone Encounter (Signed)
Pt is calling about CPAP settings. She says she has not heard anything from [facility]. [name], has download been received from [facility] for [facility] to review and make changes if necessary?

## 2010-08-11 NOTE — Telephone Encounter (Signed)
After looking in EPIC-no reports for CPAP download sent electronically as told to [place] on 08-06-10; [facility] is faxing the CPAP download results to me at [phone] for CY to review and advise.

## 2010-08-12 ENCOUNTER — Telehealth: Payer: Self-pay | Admitting: Internal Medicine

## 2010-08-12 NOTE — Telephone Encounter (Signed)
[email or web address]  

## 2010-08-13 NOTE — Telephone Encounter (Signed)
CY, please advise-this download was attached to paper message for you to address. Thanks.
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
## 2010-08-13 NOTE — Telephone Encounter (Signed)
See phone note from 08-11-10. Download was requested by Florentina Addison to be faxed for CY to review. Florentina Addison is aware. Carron Curie, CMA

## 2010-08-13 NOTE — Telephone Encounter (Signed)
Pt just called back again to state that she "pulls off her cpap mask and turns off machine during her sleep". What can she do to stop this? [name]

## 2010-08-17 ENCOUNTER — Encounter: Payer: Medicare Other | Admitting: Cardiology

## 2010-08-17 NOTE — Telephone Encounter (Signed)
Spoke with pt. She is getting frustrated that we have not sent order to have her CPAP adjusted yet. CDY, pls advise download results/recs, thanks!

## 2010-08-17 NOTE — Telephone Encounter (Signed)
PATIENT'S CPAP HAS NOT BEEN ADJUSTED YET.  PLEASE CALL PATIENT AT [phone]

## 2010-08-17 NOTE — Telephone Encounter (Signed)
This is Dr. Roxy Cedar pt.  Please read the note.

## 2010-08-21 ENCOUNTER — Encounter: Payer: Self-pay | Admitting: Internal Medicine

## 2010-08-21 ENCOUNTER — Ambulatory Visit (INDEPENDENT_AMBULATORY_CARE_PROVIDER_SITE_OTHER): Payer: Medicare Other | Admitting: Internal Medicine

## 2010-08-21 VITALS — BP 136/80 | HR 108 | Ht 62.0 in

## 2010-08-21 DIAGNOSIS — G4730 Sleep apnea, unspecified: Secondary | ICD-10-CM

## 2010-08-21 DIAGNOSIS — G4700 Insomnia, unspecified: Secondary | ICD-10-CM

## 2010-08-21 DIAGNOSIS — R0602 Shortness of breath: Secondary | ICD-10-CM

## 2010-08-21 NOTE — Progress Notes (Signed)
  Subjective:    Patient ID: [name], female    DOB: 26-Dec-1935, 75 y.o.   MRN: [id number]  HPI 08/20/10- 55 yoF never smoker followed for OSA, dyspnea, rhinitis, complicated by mental health issues( Dr Evelene Croon) and by hx right mastectomy/ breast Ca Last here 02/05/10- note reviewed. She continues CPAP. Download showed use 3.5 hrs/ night. She says she pulls it off in her sleep.  The download indicated good control AHI 3.9, at 12 cwp best pressure. She seems motivated to continue. She denies waking much during the night.  Since last here she went hospital for cath due to recurrent chest pain- told no coronary blockage.  She no longer has home oxygen. Overnight oximetry on CPAP/ room air 07/21/10- 2 minutes, 20 seconds with sat < 88%. This is good enough to leave O2 off as she wishes.   Review of Systems Constitutional:   No weight loss, night sweats,  Fevers, chills, fatigue, lassitude. HEENT:   No headaches,  Difficulty swallowing,  Tooth/dental problems,  Sore throat,                No sneezing, itching, ear ache, nasal congestion, post nasal drip,   CV:  No acute chest pain, orthopnea, PND, swelling in lower extremities, anasarca, dizziness, palpitations  GI  No heartburn, indigestion, abdominal pain, nausea, vomiting, diarrhea, change in bowel habits, loss of appetite  Resp: No acute shortness of breath with exertion or at rest.  No excess mucus, no productive cough,  No non-productive cough,  No coughing up of blood.  No change in color of mucus.  No wheezing.    Skin: no rash or lesions.  GU: no dysuria, change in color of urine, no urgency or frequency.  No flank pain.  MS:  No joint pain or swelling.  No decreased range of motion.  No back pain.  Psych:  No change in mood or affect. No depression or anxiety.  No memory loss.      Objective:   Physical Exam General- Alert, Oriented, Affect-appropriate, Distress- none acute  Poor personal hygiene  Skin- rash-none, lesions-  none, excoriation- none  Lymphadenopathy- none  Head- atraumatic  Eyes- Gross vision intact, PERRLA, conjunctivae clear secretions  Ears- Hearing, canals, Tm- normal  Nose- Clear, No- Septal dev, mucus, polyps, erosion, perforation   Throat- Mallampati II , mucosa clear , drainage- none, tonsils- atrophic  Neck- flexible , trachea midline, no stridor , thyroid nl, carotid no bruit  Chest - symmetrical excursion , unlabored     Heart/CV- RRR , no murmur , no gallop  , no rub, nl s1 s2                     - JVD- none , edema- none, stasis changes- none, varices- none     Lung- clear to P&A, wheeze- none, cough- none , dullness-none, rub- none     Chest wall- right mastectomy  Abd- tender-no, distended-no, bowel sounds-present, HSM- no  Br/ Gen/ Rectal- Not done, not indicated  Extrem- cyanosis- none, clubbing, none, atrophy- none, strength- nl  Neuro- grossly intact to observation        Assessment & Plan:

## 2010-08-21 NOTE — Patient Instructions (Addendum)
Order- [facility]   [facility]-      CPAP at 12. Advanced will tell you how to work with the ramp feature for comfort,  with a C-Flex of + 2

## 2010-08-21 NOTE — Assessment & Plan Note (Signed)
After some communication difficulties, she seems to be interested in settling down to good compliance and control with CPAP. We need to get her used enough to it that she can leave it on, without the involuntary removal during sleep.

## 2010-08-24 ENCOUNTER — Telehealth: Payer: Self-pay | Admitting: Cardiology

## 2010-08-24 NOTE — Telephone Encounter (Signed)
Dealt with at office visit this week

## 2010-08-24 NOTE — Telephone Encounter (Signed)
Spoke with pt, she reports swelling in the top of her left foot and also in the right ankle and leg. She denies any SOB or chest pain, pt encouraged to watch her salt intake and to elevate her legs when sitting and while sleeping. She will try those things and if cont to have problems she will call back [name]

## 2010-08-24 NOTE — Telephone Encounter (Signed)
Pt calling feet still swelling, what to do?

## 2010-08-26 ENCOUNTER — Telehealth: Payer: Self-pay | Admitting: Cardiology
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
## 2010-08-26 NOTE — Telephone Encounter (Signed)
Spoke with pt, pt had to call EMS today for an angina attack. She took three NTG with no relief. She was having chest pain, jaw pain and left arm pain. Her EKG was normal. Her bp was normal. She chewed 4 ASA and the EMS said she was fine. She is under a lot of stress due to tennent problems. She feels fine now and did not need anything [name]

## 2010-08-27 ENCOUNTER — Telehealth: Payer: Self-pay | Admitting: Cardiology

## 2010-08-27 NOTE — Telephone Encounter (Signed)
C/o angina attack on yesterday.

## 2010-08-28 ENCOUNTER — Encounter: Payer: Self-pay | Admitting: Internal Medicine

## 2010-09-02 NOTE — Telephone Encounter (Signed)
Spoke with pt, questions regarding chest pain answered [name]

## 2010-09-04 ENCOUNTER — Telehealth: Payer: Self-pay | Admitting: Cardiology

## 2010-09-04 NOTE — Telephone Encounter (Signed)
Pt called and wanted you to know she had an angina attack during the night.  She would like to speak to you regarding same.  She had to take 2 nitro before relief.  She stated she could not stay on the phone and hung up.  Would like to speak to Stanton Kidney but I was unable to tell her that she was off today before she disconnected.

## 2010-09-04 NOTE — Telephone Encounter (Signed)
Pt calling c/o chest pain--no answer at phone number left--nt

## 2010-09-07 ENCOUNTER — Telehealth: Payer: Self-pay | Admitting: Cardiology
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
## 2010-09-07 NOTE — Telephone Encounter (Signed)
Pt said she just spoke with [name] and wanted to leave a couple more comments for her.  Pt does not want to go to hospital unless absolutely necessary. You have to wait in ER for 15 hours they don't provide food or water. Then once you get a hospital room they come in and out of your room all the time and you cannot sleep; drawing blood all the time. Pt also said they will not even give you a little piece of ice. Pt said you don't need to return phone call.  Pt said she knows when she has to go to hospital she will go but she doesn't want to go unless absolutely necessary.

## 2010-09-10 ENCOUNTER — Encounter: Payer: Self-pay | Admitting: Cardiology

## 2010-09-10 ENCOUNTER — Other Ambulatory Visit (HOSPITAL_COMMUNITY): Payer: Medicare Other

## 2010-09-10 ENCOUNTER — Other Ambulatory Visit (INDEPENDENT_AMBULATORY_CARE_PROVIDER_SITE_OTHER): Payer: Self-pay | Admitting: Surgery

## 2010-09-10 DIAGNOSIS — Z1231 Encounter for screening mammogram for malignant neoplasm of breast: Secondary | ICD-10-CM

## 2010-09-10 NOTE — Telephone Encounter (Signed)
Per pt call, please return call to pt.  

## 2010-09-10 NOTE — Telephone Encounter (Signed)
Spoke with pt, discussed meds with pt. Questions answered. [name]

## 2010-10-01 ENCOUNTER — Encounter: Payer: Self-pay | Admitting: Internal Medicine

## 2010-10-07 ENCOUNTER — Encounter: Payer: Self-pay | Admitting: Internal Medicine

## 2010-10-08 ENCOUNTER — Telehealth: Payer: Self-pay | Admitting: Cardiology

## 2010-10-08 NOTE — Telephone Encounter (Signed)
Hard to explain will discuss when [name] call . Discuss medication

## 2010-10-08 NOTE — Telephone Encounter (Signed)
Spoke with pt, questions answered [name]  

## 2010-10-22 ENCOUNTER — Ambulatory Visit: Payer: Medicare Other | Admitting: Internal Medicine

## 2010-10-28 ENCOUNTER — Telehealth: Payer: Self-pay | Admitting: Cardiology

## 2010-10-28 NOTE — Telephone Encounter (Signed)
Pt has medication question?

## 2010-10-28 NOTE — Telephone Encounter (Signed)
Spoke with pt, questions answered [name]  

## 2010-11-05 ENCOUNTER — Ambulatory Visit: Payer: Medicare Other | Admitting: Internal Medicine

## 2010-11-09 ENCOUNTER — Telehealth: Payer: Self-pay | Admitting: Cardiology

## 2010-11-09 LAB — I-STAT 8, (EC8 V) (CONVERTED LAB)
Acid-Base Excess: 2
Chloride: 107
HCT: 45
Operator id: 294501
Potassium: 4.3
TCO2: 27
pH, Ven: 7.439 — ABNORMAL HIGH

## 2010-11-09 LAB — HEPATIC FUNCTION PANEL
ALT: 34
Alkaline Phosphatase: 167 — ABNORMAL HIGH
Bilirubin, Direct: 0.2
Indirect Bilirubin: 0.3

## 2010-11-09 LAB — POCT I-STAT CREATININE
Creatinine, Ser: 0.8
Operator id: 294501

## 2010-11-09 LAB — POCT CARDIAC MARKERS: Myoglobin, poc: 55.9

## 2010-11-09 NOTE — Telephone Encounter (Signed)
Spoke with pt, she is having anxiety and feels like she may have a panic attack. She is having incontinence and she has a little burning when she urinates. She is out of her ativan and amitriptyline and has not had very much sleep. She has no one to take her to the urgent care and she feels very anxious. Pt will call 911 if she gets uncomfortable this evening or becomes over anxious [name]

## 2010-11-09 NOTE — Telephone Encounter (Signed)
Patient calling wants to discuss medication.  

## 2010-11-17 LAB — URINALYSIS, ROUTINE W REFLEX MICROSCOPIC
Glucose, UA: NEGATIVE
pH: 6

## 2010-11-17 LAB — ETHANOL: Alcohol, Ethyl (B): <5

## 2010-11-17 LAB — URINE MICROSCOPIC-ADD ON

## 2010-11-17 LAB — POCT I-STAT, CHEM 8
BUN: 24 — ABNORMAL HIGH
Chloride: 106
Creatinine, Ser: 1.7 — ABNORMAL HIGH
Sodium: 136

## 2010-11-17 LAB — RAPID URINE DRUG SCREEN, HOSP PERFORMED
Opiates: NOT DETECTED
Tetrahydrocannabinol: NOT DETECTED

## 2010-11-18 ENCOUNTER — Telehealth: Payer: Self-pay | Admitting: Internal Medicine

## 2010-11-18 ENCOUNTER — Emergency Department (HOSPITAL_COMMUNITY)
Admission: EM | Admit: 2010-11-18 | Discharge: 2010-11-18 | Disposition: A | Discharge status: Home / Self Care | Payer: Medicare Other | Attending: Emergency Medicine | Admitting: Emergency Medicine

## 2010-11-18 DIAGNOSIS — I10 Essential (primary) hypertension: Secondary | ICD-10-CM | POA: Insufficient documentation

## 2010-11-18 DIAGNOSIS — Z853 Personal history of malignant neoplasm of breast: Secondary | ICD-10-CM | POA: Insufficient documentation

## 2010-11-18 DIAGNOSIS — M79609 Pain in unspecified limb: Secondary | ICD-10-CM | POA: Insufficient documentation

## 2010-11-18 DIAGNOSIS — R609 Edema, unspecified: Secondary | ICD-10-CM | POA: Insufficient documentation

## 2010-11-18 DIAGNOSIS — Z79899 Other long term (current) drug therapy: Secondary | ICD-10-CM | POA: Insufficient documentation

## 2010-11-18 DIAGNOSIS — E7800 Pure hypercholesterolemia, unspecified: Secondary | ICD-10-CM | POA: Insufficient documentation

## 2010-11-18 NOTE — Telephone Encounter (Signed)
ATCx2, line busy. WCB.Aja Bolander, CMA  

## 2010-11-18 NOTE — Telephone Encounter (Signed)
ATC to find out whats going on-line busy x 2. [name]

## 2010-11-18 NOTE — Telephone Encounter (Signed)
Line still busy.

## 2010-11-19 ENCOUNTER — Telehealth: Payer: Self-pay | Admitting: Internal Medicine

## 2010-11-19 DIAGNOSIS — G4733 Obstructive sleep apnea (adult) (pediatric): Secondary | ICD-10-CM

## 2010-11-19 NOTE — Telephone Encounter (Signed)
ATC pt and line was busy x 2 WCB

## 2010-11-19 NOTE — Telephone Encounter (Signed)
Pt cancelled her appt on Fri., 10/5 due to transportation issues. She says [facility] is wanting to charge her $92 a month for her CPAP because she has not been compliant with its use and Medicare is not going to pay for it if she's non-compliant. She says she takes the CPAP mask off during the night and isn't aware she has done so. She is requesting to speak with Dr. Maple Hudson if possible regarding this matter. Pls advise.

## 2010-11-20 ENCOUNTER — Ambulatory Visit: Payer: Medicare Other | Admitting: Internal Medicine

## 2010-11-20 NOTE — Telephone Encounter (Signed)
Duplicate message- see previous phone note  

## 2010-11-20 NOTE — Telephone Encounter (Signed)
Pt requests to speak w/ Lawson Fiscal & can be reached at [phone]. Antionette Fairy

## 2010-11-20 NOTE — Telephone Encounter (Signed)
See phone note 11/19/10

## 2010-11-20 NOTE — Telephone Encounter (Signed)
Pt called back states she has not heard from anyone today regarding her matter. Pt aware he is still seeing pt's. Pt states she was fine with a call back next week. Will forward to Dr. Maple Hudson.

## 2010-11-23 NOTE — Telephone Encounter (Signed)
Pt is calling back.  Pt stated she believes Lawson Fiscal was to call her today.  Antionette Fairy

## 2010-11-24 ENCOUNTER — Other Ambulatory Visit: Payer: Self-pay | Admitting: Internal Medicine

## 2010-11-24 DIAGNOSIS — R0602 Shortness of breath: Secondary | ICD-10-CM

## 2010-11-24 NOTE — Telephone Encounter (Signed)
Spoke with pt. She is requesting to speak with Dr Maple Hudson about issues with [facility]. She states that they are trying to charge her 92$ per month for her CPAP machine (due to non compliance)- she states takes her mask off throughout the night unknowingly and can not help this. She is requesting to speak with CDY about what she should do. I advised he is seeing pt's at this time, and will forward him the msg.

## 2010-11-24 NOTE — Telephone Encounter (Signed)
Order- [facility]- 1) D/C CPAP                                        2) Home O2   2 L/m for sleep, based on ONOX 07/21/10  Dx OSA

## 2010-11-24 NOTE — Telephone Encounter (Signed)
PATIENT COMPLAINS THAT LORI HAS NOT CALLED HER BACK SINCE LAST Thursday.  SHE IS DISABLED AND WANTS SOMEONE TO CALL HER BACK TODAY.

## 2010-11-24 NOTE — Telephone Encounter (Signed)
Order has been sent and pt is aware of cdy recs. Nothing further was needed

## 2010-11-26 LAB — BASIC METABOLIC PANEL
Chloride: 106
Creatinine, Ser: 0.9
GFR calc Af Amer: >60
Potassium: 4.7
Sodium: 138

## 2010-11-26 LAB — ETHANOL: Alcohol, Ethyl (B): <5

## 2010-11-26 LAB — RAPID URINE DRUG SCREEN, HOSP PERFORMED: Benzodiazepines: POSITIVE — AB

## 2010-12-03 ENCOUNTER — Ambulatory Visit: Payer: Medicare Other | Admitting: Internal Medicine

## 2010-12-03 ENCOUNTER — Telehealth: Payer: Self-pay | Admitting: Internal Medicine

## 2010-12-03 NOTE — Telephone Encounter (Signed)
Order has been faxed to [facility] for the oxygen order.   Called and spoke with pt and she is aware that this has been sent.

## 2010-12-03 NOTE — Telephone Encounter (Signed)
Attempted to call pt but line is busy.  Will try back later.  

## 2010-12-03 NOTE — Telephone Encounter (Signed)
SANDRA FROM [facility] IS WAITING ON ORDER FOR O2 SO THEY CAN DELIVER THIS TO PT. FAX # [phone]. CONTACT # [id number]. Hazel Sams

## 2010-12-07 ENCOUNTER — Inpatient Hospital Stay (HOSPITAL_COMMUNITY)
Admission: EM | Admit: 2010-12-07 | Discharge: 2010-12-10 | DRG: 392 | Disposition: A | Discharge status: Home Health | Payer: Medicare Other | Attending: Internal Medicine | Admitting: Internal Medicine

## 2010-12-07 ENCOUNTER — Emergency Department (HOSPITAL_COMMUNITY): Payer: Medicare Other

## 2010-12-07 DIAGNOSIS — G4730 Sleep apnea, unspecified: Secondary | ICD-10-CM | POA: Diagnosis present

## 2010-12-07 DIAGNOSIS — R112 Nausea with vomiting, unspecified: Principal | ICD-10-CM | POA: Diagnosis present

## 2010-12-07 DIAGNOSIS — L8990 Pressure ulcer of unspecified site, unspecified stage: Secondary | ICD-10-CM | POA: Diagnosis present

## 2010-12-07 DIAGNOSIS — Z9221 Personal history of antineoplastic chemotherapy: Secondary | ICD-10-CM

## 2010-12-07 DIAGNOSIS — I10 Essential (primary) hypertension: Secondary | ICD-10-CM | POA: Diagnosis present

## 2010-12-07 DIAGNOSIS — R269 Unspecified abnormalities of gait and mobility: Secondary | ICD-10-CM | POA: Diagnosis present

## 2010-12-07 DIAGNOSIS — L89309 Pressure ulcer of unspecified buttock, unspecified stage: Secondary | ICD-10-CM | POA: Diagnosis present

## 2010-12-07 DIAGNOSIS — M1710 Unilateral primary osteoarthritis, unspecified knee: Secondary | ICD-10-CM | POA: Diagnosis present

## 2010-12-07 DIAGNOSIS — R5381 Other malaise: Secondary | ICD-10-CM | POA: Diagnosis present

## 2010-12-07 DIAGNOSIS — E785 Hyperlipidemia, unspecified: Secondary | ICD-10-CM | POA: Diagnosis present

## 2010-12-07 DIAGNOSIS — Z853 Personal history of malignant neoplasm of breast: Secondary | ICD-10-CM

## 2010-12-07 DIAGNOSIS — R079 Chest pain, unspecified: Secondary | ICD-10-CM | POA: Diagnosis present

## 2010-12-07 DIAGNOSIS — D72829 Elevated white blood cell count, unspecified: Secondary | ICD-10-CM | POA: Diagnosis present

## 2010-12-07 DIAGNOSIS — I2510 Atherosclerotic heart disease of native coronary artery without angina pectoris: Secondary | ICD-10-CM | POA: Diagnosis present

## 2010-12-07 LAB — CK TOTAL AND CKMB (NOT AT ARMC)
CK, MB: 1.9 ng/mL (ref 0.3–4.0)
CK, MB: 2.5 ng/mL (ref 0.3–4.0)
Relative Index: INVALID (ref 0.0–2.5)
Total CK: 34 U/L (ref 7–177)

## 2010-12-07 LAB — URINALYSIS, ROUTINE W REFLEX MICROSCOPIC
Bilirubin Urine: NEGATIVE
Glucose, UA: NEGATIVE mg/dL
Hgb urine dipstick: NEGATIVE
Specific Gravity, Urine: 1.024 (ref 1.005–1.030)
pH: 5.5 (ref 5.0–8.0)

## 2010-12-07 LAB — COMPREHENSIVE METABOLIC PANEL
ALT: 43 U/L — ABNORMAL HIGH (ref 0–35)
AST: 22 U/L (ref 0–37)
CO2: 23 mEq/L (ref 19–32)
Calcium: 9.8 mg/dL (ref 8.4–10.5)
Sodium: 138 mEq/L (ref 135–145)
Total Protein: 7.4 g/dL (ref 6.0–8.3)

## 2010-12-07 LAB — DIFFERENTIAL
Basophils Relative: 0 % (ref 0–1)
Lymphocytes Relative: 8 % — ABNORMAL LOW (ref 12–46)
Monocytes Relative: 5 % (ref 3–12)
Neutro Abs: 18 10*3/uL — ABNORMAL HIGH (ref 1.7–7.7)
Neutrophils Relative %: 86 % — ABNORMAL HIGH (ref 43–77)

## 2010-12-07 LAB — CBC
Hemoglobin: 15 g/dL (ref 12.0–15.0)
MCH: 27 pg (ref 26.0–34.0)
RBC: 5.55 MIL/uL — ABNORMAL HIGH (ref 3.87–5.11)

## 2010-12-07 LAB — TROPONIN I: Troponin I: <0.3 ng/mL (ref <0.30)

## 2010-12-08 ENCOUNTER — Ambulatory Visit: Payer: Medicare Other | Admitting: Physical Therapy

## 2010-12-08 LAB — CBC
Platelets: 288 10*3/uL (ref 150–400)
RBC: 4.22 MIL/uL (ref 3.87–5.11)
WBC: 13.9 10*3/uL — ABNORMAL HIGH (ref 4.0–10.5)

## 2010-12-08 LAB — COMPREHENSIVE METABOLIC PANEL
ALT: 29 U/L (ref 0–35)
AST: 15 U/L (ref 0–37)
Albumin: 2.6 g/dL — ABNORMAL LOW (ref 3.5–5.2)
CO2: 23 mEq/L (ref 19–32)
Calcium: 8.6 mg/dL (ref 8.4–10.5)
Chloride: 103 mEq/L (ref 96–112)
GFR calc non Af Amer: 84 mL/min — ABNORMAL LOW (ref >90)
Sodium: 133 mEq/L — ABNORMAL LOW (ref 135–145)

## 2010-12-08 LAB — TROPONIN I: Troponin I: <0.3 ng/mL (ref <0.30)

## 2010-12-08 LAB — URINE CULTURE
Colony Count: NO GROWTH
Culture: NO GROWTH

## 2010-12-08 LAB — CK TOTAL AND CKMB (NOT AT ARMC)
CK, MB: 2.1 ng/mL (ref 0.3–4.0)
Total CK: 48 U/L (ref 7–177)

## 2010-12-09 ENCOUNTER — Encounter: Payer: Self-pay | Admitting: Internal Medicine

## 2010-12-09 LAB — CBC
Hemoglobin: 11.2 g/dL — ABNORMAL LOW (ref 12.0–15.0)
Platelets: 255 10*3/uL (ref 150–400)
RBC: 4.21 MIL/uL (ref 3.87–5.11)
WBC: 10.7 10*3/uL — ABNORMAL HIGH (ref 4.0–10.5)

## 2010-12-09 LAB — COMPREHENSIVE METABOLIC PANEL
ALT: 33 U/L (ref 0–35)
AST: 19 U/L (ref 0–37)
Alkaline Phosphatase: 149 U/L — ABNORMAL HIGH (ref 39–117)
CO2: 24 mEq/L (ref 19–32)
Chloride: 106 mEq/L (ref 96–112)
GFR calc non Af Amer: 69 mL/min — ABNORMAL LOW (ref >90)
Glucose, Bld: 97 mg/dL (ref 70–99)
Sodium: 139 mEq/L (ref 135–145)
Total Bilirubin: 0.4 mg/dL (ref 0.3–1.2)

## 2010-12-10 LAB — BASIC METABOLIC PANEL
BUN: 14 mg/dL (ref 6–23)
GFR calc Af Amer: 77 mL/min — ABNORMAL LOW (ref >90)
GFR calc non Af Amer: 66 mL/min — ABNORMAL LOW (ref >90)
Potassium: 3.9 mEq/L (ref 3.5–5.1)
Sodium: 138 mEq/L (ref 135–145)

## 2010-12-10 LAB — CBC
HCT: 35.7 % — ABNORMAL LOW (ref 36.0–46.0)
MCHC: 32.2 g/dL (ref 30.0–36.0)
Platelets: 296 10*3/uL (ref 150–400)
RDW: 16.3 % — ABNORMAL HIGH (ref 11.5–15.5)

## 2010-12-13 NOTE — H&P (Signed)
[name], [name]                 ACCOUNT NO.:  [id number]  MEDICAL RECORD NO.:  [id number]  LOCATION:  1443                         FACILITY:  [facility]  PHYSICIAN:  Gaspar Garbe, M.D.DATE OF BIRTH:  [birth year]  DATE OF ADMISSION:  12/07/2010 DATE OF DISCHARGE:                             HISTORY & PHYSICAL   CHIEF COMPLAINT:  Multiple complaints including chest pain.  HISTORY OF PRESENT ILLNESS:  Patient is a 75 year old white female, who came to the emergency room after developing chest pain, nausea, and vomiting this morning.  Upon arrival, EMS had a very difficult time in getting her to maneuver throughout the house and that there was considerable filth and feces in the room that she was staying in, and it was very difficult to be able to move her onto the gurney and to be able to get her out of the house because of these living conditions.  She was taken to the [facility] ER where she had a normal EKG performed.  First set of cardiac enzymes are negative.  Patient also received some IV morphine for pain and had 1 episode of vomiting upon arrival, but has not had any since.  ER did a workup including laboratory testing, EKG and chest x-ray, which were unremarkable except for an elevated white count that they had not addressed at the time I had seen the patient. They asked me to come admit her mostly for social reasons indicating that she is unable to return home.  When I spoke with [name] she was very adamant that her home is only dirty because she has a broken washing machine.  She indicates that she cannot go home tonight because she does not have anybody to sit with her and since she went to the emergency room, the home health nurse essentially called and said that she would not be in tonight.  She indicates that she has not had any help over the weekend either.  She looks considerably dishevelled and smells unclean.  She indicates that she is able to cook her own  meals which consist mostly of bacon and eggs,  and then she is having a hard time getting from one room to the other, but is able to use a wheelchair throughout the house.  She also indicates multiple orthopedic complaints for which she has seen Dr. Cleophas Dunker for. She had an injection done in her knee 2 weeks ago, which did not help, and has completed her outpatient physical therapy at home, but is supposed to be going for outpatient physical therapy later this week to initiate.  I do not have any records on this and she does not indicate how she would get to and from these visits.  Patient believes that she is only needing to stay the night, then she can return home.  We had a considerable discussion about the conditions that were seen and her need for further care. She is adamant that everything is perfectly fine however, but simply cannot stay the night.  ALLERGIES:  No known drug allergies.  MEDICATIONS: 1. Ativan 2 mg twice daily. 2. Arimidex 1 mg once daily. 3. Quinapril 20 mg once daily. 4.  Lipitor 40 mg once daily. 5. Caltrate plus D twice daily. 6. Fish oil 1 g once daily. 7. Elavil 100 mg at bedtime. 8. Aspirin 81 mg daily. 9. Furosemide 40 mg daily. 10.Celebrex 200 mg daily. 11.Voltaren gel, apply up to 4 times daily as needed.  PAST MEDICAL HISTORY: 1. Chest pain, atypical.  EKG without acute changes.  Hospitalized in     March 2012, by Dr. [name] and again 3 months later. 2. Anxiety and panic disorder, followed by Dr. Evelene Croon. 3. Hyperlipidemia. 4. History of coronary artery disease with drug-eluting stent in May     2011, ejection fraction preserved at 50%. 5. Hypertension. 6. Osteoarthritis, evaluated by Cleophas Dunker with question of spinal     stenosis as well. 7. Sleep apnea, followed by Dr. Maple Hudson.  Patient not using her CPAP. 8. History of breast cancer status post right mastectomy and     chemotherapy followed by Dr. Jamey Ripa and Dr. Caron Presume. 9. History of  cataract. 10.GERD followed by Dr. Ewing Schlein. Colonoscopy performed in July 2011 with     poor prep.  PAST SURGICAL HISTORY: 1. Cholecystectomy, 2005. 2. Mastectomy, November 2002. 3. History of knee arthroscopy. 4. Cataract surgery, bilateral, July 2011.  FAMILY HISTORY:  Father is deceased and estranged, her mother is deceased and estranged.  She is estranged from her 4 brothers.  She has 2 sons, one who is deceased and one daughter.  None of her family are here with her.  SOCIAL HISTORY:  Patient is divorced.  Has 2 living children.  In the past has had a caregiver who was not present at this time, who had previously aided her with the transportation as well as her ADLs.  She is a nonsmoker.  Does not drink alcohol.  REVIEW OF SYSTEMS:  Patient indicates chest pain, nausea and vomiting which have currently resolved and indicates that she feels a little bit sore in her bottom, but mostly has pain in her legs.  Review of systems is otherwise negative on a 12-point scale.  ADVANCED DIRECTIVES:  Patient is full code.  PHYSICAL EXAMINATION:  VITAL SIGNS:  Temperature 98.2, pulse 78, respiratory rate 18, satting 98% on 2L, blood pressure 126/72. GENERAL:  Patient appears to be disheveled and unclean, but no acute distress. HEENT:  Normocephalic, atraumatic.  PERRLA.  EOMI.  ENT is within normal limits.  She refuses to remove her knit cap. NECK:  Supple.  No lymphadenopathy. HEART:  Regular rate and rhythm. LUNGS:  Clear to auscultation bilaterally. ABDOMEN:  Soft, nontender, normoactive bowel sounds. EXTREMITIES:  No clubbing or cyanosis.  Patient has trace edema in both extremities. SKIN:  Patient has a grade 2 sacral decubitus, which is somewhat foul smelling. NEUROLOGIC:  Patient is oriented to person, place, and time.  Does not have any lateralizing deficits. MUSCULOSKELETAL:  As noted above.  Chest x-ray is nonacute.  EKG is nonacute.  LABS:  White count elevated at 20.9  with a left shift with neutrophils in the 60s, hemoglobin 15, platelets 351, BUN and creatinine are 21 and 0.7 respectively.  Electrolytes are otherwise within normal limits.  Her first set of cardiac enzymes is negative with a negative troponin less than 0.3, CK of 34 and an MB fraction of 1.9.  Urinalysis is negative for infection.  ASSESSMENT AND PLAN: 1. Chest pain.  I believe this is noncardiac given the nausea     vomiting, it is probably more GI related and may in fact be related  to somewhat to her living conditions.  I will put her on a PPI as     she is not on one at this point, but I am concerned given her     general nutrition if she has a history of coronary disease.  Tomasa Blase     and eggs is probably not the best mealtime option for her as well.     We will rule out myocardial infarction, and if she tests positive,     we will have her transferred to [facility] for Cardiology to see.  I do     not believe that this is a cardiac issue at this point, however. 2. Osteoarthritis.  She clearly needs more pain control than she has.     I am concerned about her ability to make it back and forth to     physical therapy and it would seem unusual for home physical     therapy to cancel and have a considerable lapse where she is in a     wheelchair and unable to get around her house.  I do not feel that     this is a fit living condition for her. 3. We will have social work to see the patient with regards to her     home condition; however, it seems that she is blissfully unaware.     I wonder given her general psychiatric problems, whether an     inpatient psychiatric evaluation may be necessary if she has a     reality of the area around her, and also like to get some input     from the home physical therapist and nurses on her living condition     as they have been there.  She has a CNA who she did not name, who     most likely needs to be contacted as well. 4. Physical therapy,  occupational therapy consult. 5. Coronary artery disease.  We will continue her on her blood     pressure medications. 6. Hypertension, as above. 7. Hyperlipidemia. Continue Lipitor. 8. Lovenox for deep vein thrombosis prophylaxis. 9. Elevated white count.  The only obvious nidus of infection is     decubitus ulcer.  I have started her on IV Avelox with the idea of     changing this over to p.o.  With hopes of skin and soft-tissue     infection, we will have wound care consult see the patient.  I have     also ordered blood cultures as these were not ordered in the     emergency room, and I wonder if she may in fact have a low-grade     sepsis that might be making her weakness worse.  She is afebrile at     this time however, and most likely just needs continued wound care. 10.Breast cancer.  Continue Arimidex. 11.Consider psych consult for issues as above.  Dr. Felipa Eth, her regular     physician will be seeing her in the morning and can probably     address more of her social issues, given his long-term care of her.     Of note, she was last in the office greater than 2 months ago.     Gaspar Garbe, M.D.     RWT/MEDQ  D:  12/07/2010  T:  12/08/2010  Job:  [id number]  cc:   Larina Earthly, M.D. Fax: [phone]  [name], MD, [facility] [address]  [address] [place] [address]  Electronically Signed by Guerry Bruin M.D. on 12/13/2010 04:46:20 PM

## 2010-12-14 ENCOUNTER — Ambulatory Visit: Payer: Medicare Other | Admitting: Physical Therapy

## 2010-12-14 LAB — CULTURE, BLOOD (ROUTINE X 2)
Culture  Setup Time: 201210230325
Culture: NO GROWTH

## 2010-12-15 NOTE — Discharge Summary (Signed)
NAMELAUREN, [name]                 ACCOUNT NO.:  [id number]  MEDICAL RECORD NO.:  [id number]  LOCATION:  1443                         FACILITY:  [facility]  PHYSICIAN:  Larina Earthly, M.D.        DATE OF BIRTH:  Jul 02, 1935  DATE OF ADMISSION:  12/07/2010 DATE OF DISCHARGE:  12/10/2010                        DISCHARGE SUMMARY - REFERRING   DISCHARGE DIAGNOSES: 1. Leukocytosis of unclear etiology with improvement on empiric     Avelox. 2. Decubital ulcer, very small, partial thickness with recommendations     for pressure redistribution cushion for out of bed. 3. Significant and severe osteoarthritis involving bilateral knees,     followed by Orthopedics Dr. Cleophas Dunker. 4. Coronary artery disease with history of hypertension, stable,     followed by Dr. [name]. 5. Physical deconditioning with gait instability and decreased safety     awareness, in need of rehabilitation efforts with patient refusing     skilled nursing facility placement.SECONDARY DIAGNOSES: 1. Recurrent issues with atypical chest pain, including this     admission, EKG without acute changes.  Hospitalized for the same in     March 2012 by Dr. [name] and again 3 months later by the same and     again this hospitalization with quick resolution. 2. Anxiety and panic disorder followed by Dr. Lafayette Dragon with medications     for the same prescribed by her. 3. Hyperlipidemia. 4. History of coronary artery disease with drug-eluting stent placed     in May 2011, ejection fraction preserved at 50%. 5. Sleep apnea followed by Dr. Maple Hudson, the patient is not using CPAP. 6. History of breast cancer, remote.  Status post right mastectomy and     chemotherapy followed by Dr. Jamey Ripa and Dr. Donnie Coffin. 7. History of cataracts. 8. Gastroesophageal reflux disease followed by Dr. Ewing Schlein, colonoscopy     performed in July 2011 with poor prep. 9. History of cholecystectomy in 2005. 10.History mastectomy in November 2002. 11.History of knee  arthroscopy by Dr. Cleophas Dunker. 12.History of bilateral cataract surgery in July 2011.  DISCHARGE MEDICATIONS: 1. Celebrex 200 mg. 2. Diclofenac 1% gel applied topically daily. 3. Lasix 40 mg daily. 4. Vicodin 1 every 4 hours as needed, prescription for #20 with no     refills given. 5. Avelox 400 mg daily, prescription for 7 refills given. 6. Prescriptions for Protonix, which is a new medication 40 mg daily,     prescription for 30 with 2 refills given. 7. Amitriptyline 100 mg daily at bedtime. 8. Arimidex 1 mg p.o. q.a.m. 9. Aspirin 81 mg daily. 10.Calcium and vitamins D supplements daily 11.Cinnamon 500 mg 2 capsules daily. 12.Fish oil daily. 13.Lipitor 40 mg daily. 14.Lorazepam 2 mg twice daily as needed. 15.Nitroglycerin as needed. 16.Quinapril 20 mg daily.  PERTINENT LABORATORIES:  On admission, white blood cell count 20.9 decreasing to 10.7 at discharge.  Hemoglobin at admission 15, decreased to 11.2 with IV fluids.  Platelet count 351 decreasing to 255 at discharge.  On discharge, sodium 139, potassium 3.8, serum CO2 24, BUN14 creatinine 0.82.  Please note, the BUN was 21 at admission, glucose was 97, GFR estimated at 80, total bilirubin 0.4, alkaline phosphatase 149 decreased  from 234, AST 19, ALT 33, albumin 2.4, total protein 5.1, calcium 8.6.  Three sets of cardiac enzymes including troponin-I, CK CK- MB all negative.  Urinalysis unremarkable for any evidence of infection.  DISCHARGE VITAL SIGNS:  Temperature 98.5 degrees Fahrenheit, blood pressure 107/68, pulse 80s, respirations 21, oxygen saturation 95% on room air.  RADIOLOGY:  Chest x-ray on admission reveals no active disease.  No significant change compared with old.  DISPOSITION:  The patient has refused a skilled nursing facility, despite recommendations by Physical and Occupational Therapy, who visited on multiple occasions, and please note they were turned away by the patient during the initial evaluation  on each day.  After extensive discussion between myself and the patient, the patient has agreed to the recommendations by Physical Therapy for home health physical therapy, home health aide and then transition to outpatient physical therapy once the patient is able to mobilize at modified independent level in the home environment at the very minimum feel home safety evaluation is needed.  They also felt that the patient would benefit from removal of arm rest for wheelchair if possible, specifically did not feel that the patient was independent with transfers and would benefit from further physical therapy evaluation.  Please see history and physical dictated by my partner, however, please note that the EMS when they initially picked up the patient, had a difficult to maneuver throughout her house which had considerable fills in feces in the room where she was staying and it was difficult to move her onto the gurney because of these living conditions.  The patient was adamant that was only dirty because of her broken washing machine and was insistent upon going home within 20 hours of admission.  Upon arrival to the ER, she was considerably disabled and smelled unclean.  When I further questioned her about home health coming up to the house, she was certainly quite wary of them doing a home health safety evaluation on the same day of discharge.  Indeed, I instructed the patient that she needed to co-operate with home health for safety evaluation, physical therapy, and that she could not turn them down, especially given that the initial recommendations were for a skilled nursing facility placement.  She was very concerned about her getting an outpatient prescription for Vicodin and I told her that I would only give her a very limited supply as noted above.  HISTORY OF PRESENT ILLNESS:  Please see history and physical dictated by Dr. Guerry Bruin, however, this is a 75 year old female  with the above-mentioned medical problems, who developed chest pain, nausea, and vomiting the morning of admission.  Upon arrival again, EMS had a hard time navigating through her house secondary to the filth.  She did receive some IV morphine for pain, and had 1 episode of vomiting upon arrival to the ER.  Initial workup in the ER revealed normal EKG and a chest x-ray.  Initial set of cardiac enzymes unremarkable and indeed her pain resolved and she was chest pain free.  She said that she could not return home the same night because of social reasons and my partner was called to admit.  She did have multiple orthopedic complaints and was scheduled for outpatient physical therapy per Dr. Cleophas Dunker.  She states that she is transported to her doctor's visits by an outside person and states that she may have a CNA that helps her with the house; however, she was quite vague about this and indeed will not provide  phone numbers or names of these individuals that help her with transport or as a CNA.  HOSPITAL COURSE:  The patient was admitted.  The patient ruled out for MI.  Had no further chest pain.  She did have a significant leukocytosis of unclear etiology, questionably from the decubital ulcer, on empiric Avelox.  Her white blood cell count decreased from 20,000 down to 10,000.  She remained afebrile, was eating 100% of her meals and again the main issue during this hospitalization were working with physical and occupational therapy and again, she refused evaluation on 2 different occasions during this hospitalization.  She does have significant osteoarthritis and significant mobility deficits as documented by physical therapy and again they did not feel that she was safe to go home but did recommend a skilled nursing facility placement. From a cardiac perspective, she remained hemodynamically stable.  Had no further chest pain with cardiac enzymes were negative, and her oxygen saturation  was normal on room air.  She had no evidence of volume overload or pulmonary edema.  Wound care consult was called, given reports of a decubital ulcer.  However, this proved to be very small 0.6 x 0.2 x 0.2 cm partial thickness pressure over the left ischium with recommendations for pressure redistribution cushion while she was out of bed.  The patient was deemed ready for discharge, given deferment of SNF on December 10, 2010, with the patient agreeing to home health safety evaluation, home health aide, home health physical therapy, until she was deemed appropriate for transition to an outpatient physical therapist . Again, the patient was instructed that she needed to be cooperative with these modalities.  Otherwise, it may prove quite difficult for our practice continue caring for her especially, given her significant demands for home healthy physical therapy in the past.  Well documented in our electronic medical records.     Larina Earthly, M.D.     RA/MEDQ  D:  12/09/2010  T:  12/09/2010  Job:  [id number]  Electronically Signed by Larina Earthly M.D. on 12/15/2010 04:57:23 PM

## 2010-12-16 ENCOUNTER — Ambulatory Visit: Payer: Medicare Other | Admitting: Physical Therapy

## 2010-12-18 ENCOUNTER — Telehealth: Payer: Self-pay | Admitting: Cardiology

## 2010-12-18 NOTE — Telephone Encounter (Signed)
lm

## 2010-12-18 NOTE — Telephone Encounter (Signed)
New message Pt wants a nurse to call her but wouldn't give me details

## 2010-12-22 ENCOUNTER — Ambulatory Visit: Payer: Medicare Other | Attending: Orthopaedic Surgery | Admitting: Physical Therapy

## 2010-12-22 DIAGNOSIS — IMO0001 Reserved for inherently not codable concepts without codable children: Secondary | ICD-10-CM | POA: Insufficient documentation

## 2010-12-22 DIAGNOSIS — R262 Difficulty in walking, not elsewhere classified: Secondary | ICD-10-CM | POA: Insufficient documentation

## 2010-12-22 DIAGNOSIS — R5381 Other malaise: Secondary | ICD-10-CM | POA: Insufficient documentation

## 2010-12-22 DIAGNOSIS — M25569 Pain in unspecified knee: Secondary | ICD-10-CM | POA: Insufficient documentation

## 2010-12-24 ENCOUNTER — Ambulatory Visit: Payer: Medicare Other | Admitting: Physical Therapy

## 2010-12-25 ENCOUNTER — Ambulatory Visit: Payer: Medicare Other | Admitting: Oncology

## 2010-12-25 NOTE — Telephone Encounter (Signed)
Spoke with pt, she states she does not need anything now. Deliah Goody

## 2010-12-28 ENCOUNTER — Ambulatory Visit: Payer: Medicare Other

## 2010-12-30 ENCOUNTER — Other Ambulatory Visit: Payer: Self-pay | Admitting: Oncology

## 2010-12-30 ENCOUNTER — Ambulatory Visit: Payer: Medicare Other | Admitting: Physical Therapy

## 2010-12-31 ENCOUNTER — Other Ambulatory Visit (INDEPENDENT_AMBULATORY_CARE_PROVIDER_SITE_OTHER): Payer: Self-pay | Admitting: Surgery

## 2010-12-31 ENCOUNTER — Ambulatory Visit
Admission: RE | Admit: 2010-12-31 | Discharge: 2010-12-31 | Disposition: A | Discharge status: Home / Self Care | Payer: Medicare Other | Source: Ambulatory Visit | Attending: Surgery | Admitting: Surgery

## 2010-12-31 DIAGNOSIS — Z1231 Encounter for screening mammogram for malignant neoplasm of breast: Secondary | ICD-10-CM

## 2011-01-04 ENCOUNTER — Ambulatory Visit: Payer: Medicare Other | Admitting: Physical Therapy

## 2011-01-05 ENCOUNTER — Telehealth: Payer: Self-pay | Admitting: Cardiology

## 2011-01-05 NOTE — Telephone Encounter (Signed)
New message:  Pt has some questions about pain management.  Pt is aware Stanton Kidney is off and can call her back on Weds.

## 2011-01-06 ENCOUNTER — Telehealth: Payer: Self-pay | Admitting: Cardiology

## 2011-01-06 NOTE — Telephone Encounter (Signed)
Spoke with pt, questions answered Sinthia Karabin  

## 2011-01-06 NOTE — Telephone Encounter (Signed)
Spoke with pt, questions answered [name]  

## 2011-01-06 NOTE — Telephone Encounter (Signed)
Follow-up:   Called and left you a message yesterday and was calling today wanting to follow-up.

## 2011-01-08 ENCOUNTER — Telehealth: Payer: Self-pay | Admitting: Cardiology

## 2011-01-08 NOTE — Telephone Encounter (Signed)
New problem Pt wants to talk to you about pain meds

## 2011-01-08 NOTE — Telephone Encounter (Signed)
Spoke with pt, made pt aware dr [name] does not prescribe pain meds or antibiotics. Deliah Goody

## 2011-01-11 ENCOUNTER — Ambulatory Visit: Payer: Medicare Other | Admitting: Physical Therapy

## 2011-01-14 ENCOUNTER — Encounter: Payer: Medicare Other | Admitting: Physical Therapy

## 2011-01-14 ENCOUNTER — Ambulatory Visit: Payer: Medicare Other | Admitting: Cardiology

## 2011-01-15 ENCOUNTER — Telehealth: Payer: Self-pay | Admitting: *Deleted

## 2011-01-15 NOTE — Telephone Encounter (Signed)
called patient line was busy called to inform the patient of the new date and time of the appointment from 01-19-2011 will keep trying to get in touch with the patient

## 2011-01-19 ENCOUNTER — Ambulatory Visit: Payer: Medicare Other | Admitting: Oncology

## 2011-01-19 ENCOUNTER — Telehealth: Payer: Self-pay | Admitting: Cardiology

## 2011-01-19 NOTE — Telephone Encounter (Signed)
Spoke with pt, she is having some discomfort in the upper part of both arms. If she repositions her arms the pain is better. She will try muscle rub to see if that helps or will go to the urgent care to get xray if no relief. [name]

## 2011-01-19 NOTE — Telephone Encounter (Signed)
New Msg: Pt calling wanting to speak with nurse regarding pt being in pain. Please return pt call to discuss further.

## 2011-01-21 ENCOUNTER — Other Ambulatory Visit: Payer: Self-pay | Admitting: *Deleted

## 2011-01-21 NOTE — Telephone Encounter (Signed)
Pt. Called requesting samples of arimidex.  Let her know that we do not have any and to call her pharmacy  For a refill.  She appreciated me checking on this and calling her back.

## 2011-01-26 ENCOUNTER — Ambulatory Visit: Payer: Medicare Other | Attending: Orthopaedic Surgery | Admitting: Physical Therapy

## 2011-01-26 DIAGNOSIS — R5381 Other malaise: Secondary | ICD-10-CM | POA: Insufficient documentation

## 2011-01-26 DIAGNOSIS — R262 Difficulty in walking, not elsewhere classified: Secondary | ICD-10-CM | POA: Insufficient documentation

## 2011-01-26 DIAGNOSIS — M25569 Pain in unspecified knee: Secondary | ICD-10-CM | POA: Insufficient documentation

## 2011-01-26 DIAGNOSIS — IMO0001 Reserved for inherently not codable concepts without codable children: Secondary | ICD-10-CM | POA: Insufficient documentation

## 2011-01-28 ENCOUNTER — Ambulatory Visit: Payer: Medicare Other | Admitting: Physical Therapy

## 2011-02-03 ENCOUNTER — Telehealth: Payer: Self-pay | Admitting: Cardiology

## 2011-02-03 ENCOUNTER — Encounter: Payer: Medicare Other | Admitting: Physical Therapy

## 2011-02-03 NOTE — Telephone Encounter (Signed)
Pt calling re appts

## 2011-02-03 NOTE — Telephone Encounter (Signed)
Spoke with pt, questions answered.

## 2011-02-05 ENCOUNTER — Ambulatory Visit: Payer: Medicare Other | Admitting: Cardiology

## 2011-02-12 ENCOUNTER — Encounter: Payer: Self-pay | Admitting: Internal Medicine

## 2011-02-12 ENCOUNTER — Ambulatory Visit (INDEPENDENT_AMBULATORY_CARE_PROVIDER_SITE_OTHER): Payer: Medicare Other | Admitting: Internal Medicine

## 2011-02-12 VITALS — BP 112/72 | HR 99 | Ht 62.0 in | Wt 170.0 lb

## 2011-02-12 DIAGNOSIS — G4700 Insomnia, unspecified: Secondary | ICD-10-CM

## 2011-02-12 DIAGNOSIS — G4730 Sleep apnea, unspecified: Secondary | ICD-10-CM

## 2011-02-12 NOTE — Patient Instructions (Signed)
Continue using oxygen for sleep  Please call as needed

## 2011-02-12 NOTE — Progress Notes (Signed)
Patient ID: [name], female    DOB: 12-21-1935, 75 y.o.   MRN: [id number]  HPI 08/20/10- 58 yoF never smoker followed for OSA, dyspnea, rhinitis, complicated by mental health issues( Dr Evelene Croon) and by hx right mastectomy/ breast Ca Last here 02/05/10- note reviewed. She continues CPAP. Download showed use 3.5 hrs/ night. She says she pulls it off in her sleep.  The download indicated good control AHI 3.9, at 12 cwp best pressure. She seems motivated to continue. She denies waking much during the night.  Since last here she went hospital for cath due to recurrent chest pain- told no coronary blockage.  She no longer has home oxygen. Overnight oximetry on CPAP/ room air 07/21/10- 2 minutes, 20 seconds with sat < 88%. This is good enough to leave O2 off as she wishes.   02/12/11-  75 yoF never smoker followed for OSA, dyspnea, rhinitis, complicated by mental health issues( Dr Evelene Croon) and by hx right mastectomy/ breast Ca Failed CPAP- kept pulling it off. She does wear oxygen all night at 2 L as intended and sleeps better with this. Watery rhinorrhea in the mornings. She declines flu vaccine. Has avoided recent respiratory infections. She has been following politics closely and was quite sharp about these issues.  Review of Systems Constitutional:   No-   weight loss, night sweats, fevers, chills, fatigue, lassitude. HEENT:   No-  headaches, difficulty swallowing, tooth/dental problems, sore throat,       No-  sneezing, itching, ear ache, nasal congestion,  +post nasal drip,  CV:  No-   chest pain, orthopnea, PND, swelling in lower extremities, anasarca, dizziness, palpitations Resp: No-   shortness of breath with exertion or at rest, wearing oxygen.            No-   productive cough,  No non-productive cough,  No- coughing up of blood.              No-   change in color of mucus.  No- wheezing.   Skin: No-   rash or lesions. GI:  No-   heartburn, indigestion, abdominal pain, nausea, vomiting,  diarrhea,                 change in bowel habits, loss of appetite GU: No-   dysuria, change in color of urine, no urgency or frequency.  No- flank pain. MS:  No-   joint pain or swelling.  No- decreased range of motion.  No- back pain. Neuro-     nothing unusual Psych:  No- change in mood or affect. No acute depression or anxiety.  No memory loss.  Objective:   Physical Exam General- Alert, Oriented, Affect-appropriate, Distress- none acute, bright and alert, wheelchair Skin- rash-none, lesions- none, excoriation- none Lymphadenopathy- none Head- atraumatic            Eyes- Gross vision intact, PERRLA, conjunctivae clear secretions            Ears- Hearing, canals-normal            Nose- Clear, no-Septal dev, mucus, polyps, erosion, perforation             Throat- Mallampati II , mucosa clear , drainage- none, tonsils- atrophic Neck- flexible , trachea midline, no stridor , thyroid nl, carotid no bruit Chest - symmetrical excursion , unlabored           Heart/CV- RRR , no murmur , no gallop  , no rub, nl s1 s2                           -  JVD- none , edema- none, stasis changes- none, varices- none           Lung- clear to P&A, wheeze- none, cough- none , dullness-none, rub- none           Chest wall- right mastectomy Abd- tender-no, distended-no, bowel sounds-present, HSM- no Br/ Gen/ Rectal- Not done, not indicated Extrem- cyanosis- none, clubbing, none, atrophy- none, strength- nl Neuro- grossly intact to observation

## 2011-02-14 NOTE — Assessment & Plan Note (Signed)
She seems to sleep comfortably with oxygen and to feel better rested in the daytime as a result. This is a satisfactory compromise for now.

## 2011-02-15 ENCOUNTER — Ambulatory Visit: Payer: Medicare Other | Admitting: Physical Therapy

## 2011-02-17 ENCOUNTER — Encounter: Payer: Medicare Other | Admitting: Physical Therapy

## 2011-02-17 ENCOUNTER — Other Ambulatory Visit: Payer: Medicare Other | Admitting: Lab

## 2011-02-18 ENCOUNTER — Other Ambulatory Visit: Payer: Self-pay | Admitting: Oncology

## 2011-02-18 ENCOUNTER — Other Ambulatory Visit (HOSPITAL_BASED_OUTPATIENT_CLINIC_OR_DEPARTMENT_OTHER): Payer: Medicare Other | Admitting: Lab

## 2011-02-18 DIAGNOSIS — C50419 Malignant neoplasm of upper-outer quadrant of unspecified female breast: Secondary | ICD-10-CM

## 2011-02-18 LAB — COMPREHENSIVE METABOLIC PANEL
Alkaline Phosphatase: 239 U/L — ABNORMAL HIGH (ref 39–117)
BUN: 16 mg/dL (ref 6–23)
CO2: 23 mEq/L (ref 19–32)
Creatinine, Ser: 0.72 mg/dL (ref 0.50–1.10)
Glucose, Bld: 99 mg/dL (ref 70–99)
Sodium: 139 mEq/L (ref 135–145)
Total Bilirubin: 0.6 mg/dL (ref 0.3–1.2)
Total Protein: 6.6 g/dL (ref 6.0–8.3)

## 2011-02-18 LAB — CBC WITH DIFFERENTIAL/PLATELET
Basophils Absolute: 0 10*3/uL (ref 0.0–0.1)
HGB: 13.9 g/dL (ref 11.6–15.9)
LYMPH%: 18.5 % (ref 14.0–49.7)
MCH: 28.1 pg (ref 25.1–34.0)
MCHC: 33.7 g/dL (ref 31.5–36.0)
MONO%: 2.4 % (ref 0.0–14.0)
NEUT%: 75.8 % (ref 38.4–76.8)
RDW: 15.4 % — ABNORMAL HIGH (ref 11.2–14.5)
WBC: 7.8 10*3/uL (ref 3.9–10.3)
lymph#: 1.4 10*3/uL (ref 0.9–3.3)

## 2011-02-18 LAB — VITAMIN D 25 HYDROXY (VIT D DEFICIENCY, FRACTURES): Vit D, 25-Hydroxy: 36 ng/mL (ref 30–89)

## 2011-02-22 ENCOUNTER — Ambulatory Visit (INDEPENDENT_AMBULATORY_CARE_PROVIDER_SITE_OTHER): Payer: Medicare Other

## 2011-02-22 DIAGNOSIS — B351 Tinea unguium: Secondary | ICD-10-CM

## 2011-02-22 DIAGNOSIS — M25519 Pain in unspecified shoulder: Secondary | ICD-10-CM

## 2011-02-22 DIAGNOSIS — M542 Cervicalgia: Secondary | ICD-10-CM

## 2011-02-22 DIAGNOSIS — M79609 Pain in unspecified limb: Secondary | ICD-10-CM

## 2011-02-22 DIAGNOSIS — Z131 Encounter for screening for diabetes mellitus: Secondary | ICD-10-CM

## 2011-02-23 ENCOUNTER — Encounter: Payer: Self-pay | Admitting: *Deleted

## 2011-02-23 ENCOUNTER — Telehealth: Payer: Self-pay | Admitting: Oncology

## 2011-02-23 ENCOUNTER — Encounter: Payer: Medicare Other | Admitting: Physical Therapy

## 2011-02-23 ENCOUNTER — Ambulatory Visit (HOSPITAL_BASED_OUTPATIENT_CLINIC_OR_DEPARTMENT_OTHER): Payer: Medicare Other | Admitting: Oncology

## 2011-02-23 ENCOUNTER — Ambulatory Visit: Payer: Medicare Other | Admitting: Oncology

## 2011-02-23 VITALS — BP 135/74 | HR 96 | Temp 98.0°F

## 2011-02-23 DIAGNOSIS — C50919 Malignant neoplasm of unspecified site of unspecified female breast: Secondary | ICD-10-CM

## 2011-02-23 DIAGNOSIS — E559 Vitamin D deficiency, unspecified: Secondary | ICD-10-CM

## 2011-02-23 MED ORDER — ANASTROZOLE 1 MG PO TABS: 1.0000 mg | ORAL_TABLET | Freq: Every day | ORAL | Status: DC

## 2011-02-23 MED ORDER — ANASTROZOLE 1 MG PO TABS: 1.0000 mg | ORAL_TABLET | Freq: Every day | ORAL | Status: AC

## 2011-02-23 NOTE — Telephone Encounter (Signed)
sent mammogram order back to MD to correct once done will mail pts appts for jan2014 along with mammogram appt

## 2011-02-23 NOTE — Progress Notes (Signed)
Clinical Social Work received referral from [employer] for transportation and other psychosocial needs.  CSW met with pt in the exam room to asses for needs and concerns.  Pt stated her friend and primary source of transportation would no longer be able to consistently transport her to appointments.  Due to pt not being ambulatory and her insurance coverage there are limited resources available.  Pt stated she has used [employer] in the past, but due to her ambulatory status she no long meets their criteria.  This is also the case with [facility].  Pt stated she had contacted [employer] for an application a few day ago.  CSW encouraged pt to call once she received the application so CSW could assist her with completing the necessary forms.  Pt also expressed concerns and increased stress for her home situation and living alone.  CSW validated pt's concerns, and explored possible options.  Pt stated she had looked into assisted living facilities, but she would be required to sell her house in order to meet the financial needs.  CSW provided pt with additional support and contact information.  CSW encouraged pt to call once she received [employer] application or with other needs/concerns.  Tamala Julian, LCSW

## 2011-02-23 NOTE — Progress Notes (Signed)
Hematology and Oncology Follow Up Visit  [name] [id number] [birth year] 76 y.o. 02/23/2011 1:29 PM PCP dr Joni Fears young; dr Evelene Croon; dr c Jamey Ripa;   Principle Diagnosis: 1.  T2 N1 breast cancer, status post mastectomy in February 2002, status post [facility] x6 completed in May 2003, on Arimidex since. 2.  History of anxiety and depression. 3.  History of sleep apnea on cpap/ home o2 4.  History of hypertension. 5. Hx of atypical CP, s/p recent cath -ve findings.  Interim History:  There have been no intercurrent illness, hospitalizations or medication changes.  Medications: I have reviewed the patient's current medications.  Allergies: No Known Allergies  Past Medical History, Surgical history, Social history, and Family History were reviewed and updated.  Review of Systems: Constitutional:  Negative for fever, chills, night sweats, anorexia, weight loss, pain. Cardiovascular: no chest pain or dyspnea on exertion Respiratory: no cough, shortness of breath, or wheezing Neurological: negative Dermatological: negative ENT: negative Skin Gastrointestinal: no abdominal pain, change in bowel habits, or black or bloody stools Genito-Urinary: no dysuria, trouble voiding, or hematuria Hematological and Lymphatic: negative Breast: negative Musculoskeletal: negative Remaining ROS negative.  Physical Exam: Blood pressure 135/74, pulse 96, temperature 98 F (36.7 C). ECOG: 1 General appearance: alert, cooperative and appears stated age Head: Normocephalic, without obvious abnormality, atraumatic Neck: no adenopathy, no carotid bruit, no JVD, supple, symmetrical, trachea midline and thyroid not enlarged, symmetric, no tenderness/mass/nodules Lymph nodes: Cervical, supraclavicular, and axillary nodes normal. Cardiac : regular rate and rhythm, no murmurs or gallops Pulmonary:clear to auscultation bilaterally and normal percussion bilaterally Breasts: negative Abdomen:soft, non-tender; bowel  sounds normal; no masses,  no organomegaly Extremities negative Neuro: alert, oriented, normal speech, no focal findings or movement disorder noted  Lab Results: Lab Results  Component Value Date   WBC 7.8 02/18/2011   HGB 13.9 02/18/2011   HCT 41.1 02/18/2011   MCV 83.5 02/18/2011   PLT 283 02/18/2011     Chemistry      Component Value Date/Time   NA 139 02/18/2011 1011   NA 139 02/18/2011 1011   NA 139 02/18/2011 1011   K 4.4 02/18/2011 1011   K 4.4 02/18/2011 1011   K 4.4 02/18/2011 1011   CL 105 02/18/2011 1011   CL 105 02/18/2011 1011   CL 105 02/18/2011 1011   CO2 23 02/18/2011 1011   CO2 23 02/18/2011 1011   CO2 23 02/18/2011 1011   BUN 16 02/18/2011 1011   BUN 16 02/18/2011 1011   BUN 16 02/18/2011 1011   CREATININE 0.72 02/18/2011 1011   CREATININE 0.72 02/18/2011 1011   CREATININE 0.72 02/18/2011 1011      Component Value Date/Time   CALCIUM 9.4 02/18/2011 1011   CALCIUM 9.4 02/18/2011 1011   CALCIUM 9.4 02/18/2011 1011   ALKPHOS 239* 02/18/2011 1011   ALKPHOS 239* 02/18/2011 1011   ALKPHOS 239* 02/18/2011 1011   AST 20 02/18/2011 1011   AST 20 02/18/2011 1011   AST 20 02/18/2011 1011   ALT 32 02/18/2011 1011   ALT 32 02/18/2011 1011   ALT 32 02/18/2011 1011   BILITOT 0.6 02/18/2011 1011   BILITOT 0.6 02/18/2011 1011   BILITOT 0.6 02/18/2011 1011      .pathology. Radiological Studies: chest X-ray n/a Mammogram Due 11/13  Bone density n/a Due 11/13 Impression and Plan: [name] is doing well, nED.Marland Kitchen She wants to stay on arimidex and continue annual f/u. I have encouraged increased po vitamin d and f/u mammogram  and bone density testing.  More than 50% of the visit was spent in patient-related counselling   Pierce Crane, MD 1/8/20131:29 PM

## 2011-02-24 ENCOUNTER — Encounter: Payer: Medicare Other | Admitting: Physical Therapy

## 2011-02-27 ENCOUNTER — Other Ambulatory Visit: Payer: Self-pay | Admitting: Cardiology

## 2011-03-01 ENCOUNTER — Encounter: Payer: Medicare Other | Admitting: Physical Therapy

## 2011-03-01 ENCOUNTER — Telehealth: Payer: Self-pay | Admitting: Oncology

## 2011-03-01 ENCOUNTER — Other Ambulatory Visit: Payer: Self-pay | Admitting: Oncology

## 2011-03-01 ENCOUNTER — Other Ambulatory Visit: Payer: Self-pay | Admitting: *Deleted

## 2011-03-01 ENCOUNTER — Other Ambulatory Visit: Payer: Self-pay

## 2011-03-01 DIAGNOSIS — Z1231 Encounter for screening mammogram for malignant neoplasm of breast: Secondary | ICD-10-CM

## 2011-03-01 MED ORDER — NITROGLYCERIN 0.4 MG SL SUBL: 0.4000 mg | SUBLINGUAL_TABLET | SUBLINGUAL | Status: DC | PRN

## 2011-03-01 NOTE — Telephone Encounter (Signed)
Fax Received. Refill Completed. Fernando Torry Chowoe (R.M.A)   

## 2011-03-01 NOTE — Telephone Encounter (Signed)
called the [facility] orders are still not correct. kasie stated that she will email the MD to reqest correction of orders

## 2011-03-03 ENCOUNTER — Encounter: Payer: Medicare Other | Admitting: Physical Therapy

## 2011-03-04 ENCOUNTER — Encounter: Payer: Medicare Other | Admitting: Physical Therapy

## 2011-03-08 ENCOUNTER — Ambulatory Visit: Payer: Medicare Other

## 2011-03-09 ENCOUNTER — Other Ambulatory Visit: Payer: Medicare Other | Admitting: Lab

## 2011-03-11 ENCOUNTER — Encounter: Payer: Medicare Other | Admitting: Physical Therapy

## 2011-03-11 ENCOUNTER — Ambulatory Visit: Payer: Medicare Other | Admitting: Oncology

## 2011-03-14 ENCOUNTER — Emergency Department (HOSPITAL_COMMUNITY): Payer: Medicare Other

## 2011-03-14 ENCOUNTER — Encounter (HOSPITAL_COMMUNITY): Payer: Self-pay | Admitting: Emergency Medicine

## 2011-03-14 ENCOUNTER — Other Ambulatory Visit: Payer: Self-pay

## 2011-03-14 ENCOUNTER — Emergency Department (HOSPITAL_COMMUNITY)
Admission: EM | Admit: 2011-03-14 | Discharge: 2011-03-14 | Disposition: A | Discharge status: Home / Self Care | Payer: Medicare Other | Attending: Emergency Medicine | Admitting: Emergency Medicine

## 2011-03-14 DIAGNOSIS — M25511 Pain in right shoulder: Secondary | ICD-10-CM

## 2011-03-14 DIAGNOSIS — Z79899 Other long term (current) drug therapy: Secondary | ICD-10-CM | POA: Insufficient documentation

## 2011-03-14 DIAGNOSIS — M79609 Pain in unspecified limb: Secondary | ICD-10-CM | POA: Insufficient documentation

## 2011-03-14 DIAGNOSIS — Z7982 Long term (current) use of aspirin: Secondary | ICD-10-CM | POA: Insufficient documentation

## 2011-03-14 DIAGNOSIS — R6884 Jaw pain: Secondary | ICD-10-CM | POA: Insufficient documentation

## 2011-03-14 DIAGNOSIS — E785 Hyperlipidemia, unspecified: Secondary | ICD-10-CM | POA: Insufficient documentation

## 2011-03-14 DIAGNOSIS — I2510 Atherosclerotic heart disease of native coronary artery without angina pectoris: Secondary | ICD-10-CM | POA: Insufficient documentation

## 2011-03-14 DIAGNOSIS — I10 Essential (primary) hypertension: Secondary | ICD-10-CM | POA: Insufficient documentation

## 2011-03-14 DIAGNOSIS — M25519 Pain in unspecified shoulder: Secondary | ICD-10-CM | POA: Insufficient documentation

## 2011-03-14 DIAGNOSIS — R079 Chest pain, unspecified: Secondary | ICD-10-CM | POA: Insufficient documentation

## 2011-03-14 DIAGNOSIS — Z853 Personal history of malignant neoplasm of breast: Secondary | ICD-10-CM | POA: Insufficient documentation

## 2011-03-14 DIAGNOSIS — M542 Cervicalgia: Secondary | ICD-10-CM | POA: Insufficient documentation

## 2011-03-14 DIAGNOSIS — F341 Dysthymic disorder: Secondary | ICD-10-CM | POA: Insufficient documentation

## 2011-03-14 DIAGNOSIS — M1990 Unspecified osteoarthritis, unspecified site: Secondary | ICD-10-CM | POA: Insufficient documentation

## 2011-03-14 LAB — COMPREHENSIVE METABOLIC PANEL
AST: 20 U/L (ref 0–37)
BUN: 16 mg/dL (ref 6–23)
CO2: 26 mEq/L (ref 19–32)
Calcium: 10.6 mg/dL — ABNORMAL HIGH (ref 8.4–10.5)
Chloride: 102 mEq/L (ref 96–112)
Creatinine, Ser: 0.89 mg/dL (ref 0.50–1.10)
GFR calc Af Amer: 73 mL/min — ABNORMAL LOW (ref >90)
GFR calc non Af Amer: 63 mL/min — ABNORMAL LOW (ref >90)
Glucose, Bld: 108 mg/dL — ABNORMAL HIGH (ref 70–99)
Total Bilirubin: 0.6 mg/dL (ref 0.3–1.2)

## 2011-03-14 LAB — PROTIME-INR: INR: 1.06 (ref 0.00–1.49)

## 2011-03-14 LAB — CBC
HCT: 38.5 % (ref 36.0–46.0)
Hemoglobin: 13 g/dL (ref 12.0–15.0)
MCH: 28.1 pg (ref 26.0–34.0)
MCV: 83.3 fL (ref 78.0–100.0)
Platelets: 322 10*3/uL (ref 150–400)
RBC: 4.62 MIL/uL (ref 3.87–5.11)
WBC: 11.9 10*3/uL — ABNORMAL HIGH (ref 4.0–10.5)

## 2011-03-14 LAB — POCT I-STAT TROPONIN I
Troponin i, poc: 0 ng/mL (ref 0.00–0.08)
Troponin i, poc: 0 ng/mL (ref 0.00–0.08)

## 2011-03-14 MED ORDER — HEPARIN BOLUS VIA INFUSION
4000.0000 [IU] | Freq: Once | INTRAVENOUS | Status: AC
  Administered 2011-03-14: 4000 [IU] via INTRAVENOUS

## 2011-03-14 MED ORDER — TECHNETIUM TO 99M ALBUMIN AGGREGATED
6.0000 | Freq: Once | INTRAVENOUS | Status: AC | PRN
  Administered 2011-03-14: 6 via INTRAVENOUS

## 2011-03-14 MED ORDER — MORPHINE SULFATE 4 MG/ML IJ SOLN
4.0000 mg | Freq: Once | INTRAMUSCULAR | Status: AC
  Administered 2011-03-14: 4 mg via INTRAVENOUS
  Filled 2011-03-14: qty 1

## 2011-03-14 MED ORDER — NITROGLYCERIN 0.4 MG SL SUBL: 0.4000 mg | SUBLINGUAL_TABLET | SUBLINGUAL | Status: DC | PRN

## 2011-03-14 MED ORDER — HEPARIN SOD (PORCINE) IN D5W 100 UNIT/ML IV SOLN
1000.0000 [IU]/h | INTRAVENOUS | Status: DC
  Filled 2011-03-14 (×2): qty 250

## 2011-03-14 MED ORDER — TRAMADOL HCL 50 MG PO TABS: 50.0000 mg | ORAL_TABLET | Freq: Four times a day (QID) | ORAL | Status: DC | PRN

## 2011-03-14 MED ORDER — ASPIRIN 81 MG PO CHEW
324.0000 mg | CHEWABLE_TABLET | Freq: Once | ORAL | Status: AC
  Administered 2011-03-14: 324 mg via ORAL
  Filled 2011-03-14: qty 4

## 2011-03-14 MED ORDER — HEPARIN BOLUS VIA INFUSION: 3500.0000 [IU] | Freq: Once | INTRAVENOUS | Status: DC

## 2011-03-14 MED ORDER — XENON XE 133 GAS
10.0000 | GAS_FOR_INHALATION | Freq: Once | RESPIRATORY_TRACT | Status: AC | PRN
  Administered 2011-03-14: 10 via RESPIRATORY_TRACT

## 2011-03-14 MED ORDER — HEPARIN SOD (PORCINE) IN D5W 100 UNIT/ML IV SOLN
1200.0000 [IU]/h | INTRAVENOUS | Status: DC
  Administered 2011-03-14: 1000 [IU]/h via INTRAVENOUS
  Filled 2011-03-14: qty 250

## 2011-03-14 MED ORDER — NITROGLYCERIN IN D5W 200-5 MCG/ML-% IV SOLN
10.0000 ug/min | INTRAVENOUS | Status: DC
  Administered 2011-03-14: 5 ug/min via INTRAVENOUS
  Filled 2011-03-14: qty 250

## 2011-03-14 NOTE — ED Provider Notes (Addendum)
9:27 AM   Discussed VQ scan with radiologist. Preliminary interpretation is for very low probability for PE  Discussed with Cardiology.  He inquired about 12-lead ECG. Will see pt in ED.    ECG from 01:12 is reviewed by myself.  Unremarkable for any acute ischemic changes.    Await formal consultation by Cardiology.   Morgin Halls A. Patrica Duel, MD 03/14/11 0931  10:14 AM Seen by cardiology, Dr. Ranae Palms.  He recommends stopping all medications and sending the patient home at this time. We'll discharge patient. We'll contact patient's primary Dr. to make them aware.   Dr Lorain Childes noted by phone.  Agrees.   Chala Gul A. Patrica Duel, MD 03/14/11 1015  Theron Arista A. Patrica Duel, MD 03/14/11 1030

## 2011-03-14 NOTE — Consult Note (Signed)
CARDIOLOGY CONSULT NOTE  Patient ID: [name] MRN: [id number] DOB/AGE: 74/09/1935 74 y.o.  Admit date: 03/14/2011 Referring Physician: Jacky Kindle Primary Physician: No primary provider on file. Primary Cardiologist: [name] Reason for Consultation: Chest Pain    HPI:  This is a 76 year old female with prior  history of coronary artery disease status post LAD stenting in 2011 who  was in her usual state of health until the morning of admission when she  developed diffuse back pain , neck and shoulder pain. She has been in the ER About nine hous.  ECG is normal and enzymes negative.  Dr Jacky Kindle called and ordered A V/Q that was negative  She was admitted 5/12 with cath showing patent LAD stent  And no significant disease in circ and RCA. Numerous phone calls to office with atypical Symptoms and somatic complaints   @ROS @ All other systems reviewed and negative except as noted above  Past Medical History  Diagnosis Date  . Sleep apnea     insomnia  . Hyperlipidemia   . Hypertension   . Anxiety   . Chronic headache   . Osteoarthritis   . Panic attacks   . CAD (coronary artery disease)   . Malignant neoplasm of breast   . Depression     No family history on file.  History   Social History  . Marital Status: Divorced    Spouse Name: N/A    Number of Children: 2  . Years of Education: N/A   Occupational History  . Unemployed    Social History Main Topics  . Smoking status: Never Smoker   . Smokeless tobacco: Not on file  . Alcohol Use: No  . Drug Use: Not on file  . Sexually Active: Not on file   Other Topics Concern  . Not on file   Social History Narrative  . No narrative on file    Past Surgical History  Procedure Date  . Cholecystectomy   . Right mastectomy   . Tonsillectomy   . Knee surgery   . US echocardiography 10/04/2006    EF 55-60%        . aspirin  324 mg Oral Once  . heparin  4,000 Units Intravenous Once  .  morphine injection  4  mg Intravenous Once  .  morphine injection  4 mg Intravenous Once  .  morphine injection  4 mg Intravenous Once  . DISCONTD: heparin  3,500 Units Intravenous Once      . heparin    . nitroGLYCERIN 5 mcg/min (03/14/11 0358)  . DISCONTD: heparin 1,000 Units/hr (03/14/11 4098)    Physical Exam: Blood pressure 112/55, pulse 80, temperature 98 F (36.7 C), temperature source Oral, resp. rate 16, SpO2 96.00%.   Affect appropriate Obese female talking on phone to friend HEENT: normal Neck supple with no adenopathy JVP normal no bruits no thyromegaly Lungs clear with no wheezing and good diaphragmatic motion Heart:  S1/S2 no murmur, no rub, gallop or click PMI normal Abdomen: benighn, BS positve, no tenderness, no AAA no bruit.  No HSM or HJR Distal pulses intact with no bruits Plus one  edema Neuro non-focal Skin warm and dry No muscular weakness   Labs:   Lab Results  Component Value Date   WBC 11.9* 03/14/2011   HGB 13.0 03/14/2011   HCT 38.5 03/14/2011   MCV 83.3 03/14/2011   PLT 322 03/14/2011    Lab 03/14/11 0158  NA 140  K 5.5*  CL 102  CO2 26  BUN 16  CREATININE 0.89  CALCIUM 10.6*  PROT 7.2  BILITOT 0.6  ALKPHOS 245*  ALT 25  AST 20  GLUCOSE 108*   Lab Results  Component Value Date   CKTOTAL 48 12/08/2010   CKMB 2.1 12/08/2010   TROPONINI <0.30 12/08/2010    Lab Results  Component Value Date   CHOL 121 07/14/2010   CHOL  Value: 130        ATP III CLASSIFICATION:  <200     mg/dL   Desirable  119-147  mg/dL   Borderline High  >=829    mg/dL   High        5/62/1308   CHOL  Value: 152        ATP III CLASSIFICATION:  <200     mg/dL   Desirable  657-846  mg/dL   Borderline High  >=962    mg/dL   High        95/28/4132   Lab Results  Component Value Date   HDL 36* 07/14/2010   HDL 36* 05/13/2010   HDL 50 12/01/2009   Lab Results  Component Value Date   LDLCALC  Value: 63        Total Cholesterol/HDL:CHD Risk Coronary Heart Disease Risk Table                      Men   Women  1/2 Average Risk   3.4   3.3  Average Risk       5.0   4.4  2 X Average Risk   9.6   7.1  3 X Average Risk  23.4   11.0        Use the calculated Patient Ratio above and the CHD Risk Table to determine the patient's CHD Risk.        ATP III CLASSIFICATION (LDL):  <100     mg/dL   Optimal  440-102  mg/dL   Near or Above                    Optimal  130-159  mg/dL   Borderline  725-366  mg/dL   High  >440     mg/dL   Very High 3/47/4259   LDLCALC  Value: 67        Total Cholesterol/HDL:CHD Risk Coronary Heart Disease Risk Table                     Men   Women  1/2 Average Risk   3.4   3.3  Average Risk       5.0   4.4  2 X Average Risk   9.6   7.1  3 X Average Risk  23.4   11.0        Use the calculated Patient Ratio above and the CHD Risk Table to determine the patient's CHD Risk.        ATP III CLASSIFICATION (LDL):  <100     mg/dL   Optimal  563-875  mg/dL   Near or Above                    Optimal  130-159  mg/dL   Borderline  643-329  mg/dL   High  >518     mg/dL   Very High 8/41/6606   LDLCALC  Value: 90        Total Cholesterol/HDL:CHD Risk Coronary Heart Disease Risk Table  Men   Women  1/2 Average Risk   3.4   3.3  Average Risk       5.0   4.4  2 X Average Risk   9.6   7.1  3 X Average Risk  23.4   11.0        Use the calculated Patient Ratio above and the CHD Risk Table to determine the patient's CHD Risk.        ATP III CLASSIFICATION (LDL):  <100     mg/dL   Optimal  161-096  mg/dL   Near or Above                    Optimal  130-159  mg/dL   Borderline  045-409  mg/dL   High  >811     mg/dL   Very High 91/47/8295   Lab Results  Component Value Date   TRIG 109 07/14/2010   TRIG 134 05/13/2010   TRIG 60 12/01/2009   Lab Results  Component Value Date   CHOLHDL 3.4 07/14/2010   CHOLHDL 3.6 05/13/2010   CHOLHDL 3.0 12/01/2009   No results found for this basename: LDLDIRECT      Radiology: Nm Pulmonary Per & Vent  03/14/2011  *RADIOLOGY REPORT*  Clinical  Data:  Chest pain, concern for pulmonary embolism  NUCLEAR MEDICINE VENTILATION - PERFUSION LUNG SCAN  Technique:  Wash-in, equilibrium, and wash-out phase ventilation images were obtained using Xe-133 gas.  Perfusion images were obtained in multiple projections after intravenous injection of Tc- 68m MAA.  Radiopharmaceuticals:  10.0 mCi Xe-133 gas and 6.0 mCi Tc-70m MAA.  Comparison: Chest radiograph 03/14/2011  Findings:  Ventilation:  No focal ventilation defect. There is air trapping at the lung bases.  Perfusion:  No wedge shaped peripheral perfusion defects to suggest acute pulmonary embolism  IMPRESSION:  1.  Very low probability for acute pulmonary embolism.  2.  Air trapping at the lung bases consistent with COPD.  Original Report Authenticated By: Genevive Bi, M.D.   Dg Chest Portable 1 View  03/14/2011  *RADIOLOGY REPORT*  Clinical Data: Muscle spasm down the right arm.  Pain radiating into the neck.  The patient is unable to use the right side. History of right breast cancer.  PORTABLE CHEST - 1 VIEW  Comparison: 12/07/2010  Findings: Mild cardiac enlargement with normal pulmonary vascularity.  No focal airspace consolidation in the lungs.  No blunting of costophrenic angles.  No pneumothorax.  Degenerative changes in the spine.  No significant change since previous study.  IMPRESSION: No evidence of active pulmonary disease.  Cardiac enlargement.  Original Report Authenticated By: Marlon Pel, M.D.    EKG: NSR rate 74 normal ECG   ASSESSMENT AND PLAN:  Chest Pain:  Atypical recent cath with patent stent.  D/C nitro Ok to D/C home  Has F/U appt with Dr [name] tomorrow.   HTN: Well controlled.  Continue current medications and low sodium Dash type diet.   Chol:  Continue statin Anxiety:  Apparently there are some issues with her home health aid.  F/U with primary regarding independent living.  SignedCharlton Haws 03/14/2011, 10:20 AM

## 2011-03-14 NOTE — ED Notes (Signed)
This Clinical research associate explained to pt about dc procedure, pt requesting to stay in our ED until she can get a ride home around 1300 today, pt refusing to ride home via [place]

## 2011-03-14 NOTE — Progress Notes (Addendum)
PHARMACY - ANTICOAGULATION INTIAL CONSULT   Pharmacy Consult for: Heparin  Indication: R/o ACS   Patient Data:   Allergies: No Known Allergies  Patient Measurements:  Height: 5'2" Weight: 77.1 kg  Heparin dosing weight: 67 kg  Vital Signs: Temp:  [98 F (36.7 C)] 98 F (36.7 C) (01/27 0111) Pulse Rate:  [80-87] 80  (01/27 0728) Resp:  [12-18] 16  (01/27 0728) BP: (95-114)/(40-55) 114/55 mmHg (01/27 0728) SpO2:  [94 %-100 %] 98 % (01/27 0728)  Intake/Output from previous day: No intake or output data in the 24 hours ending 03/14/11 0734  Labs:  Basename 03/14/11 0158  HGB 13.0  HCT 38.5  PLT 322  APTT 26  LABPROT 14.0  INR 1.06  HEPARINUNFRC --  CREATININE 0.89  CKTOTAL --  CKMB --  TROPONINI --   The CrCl is unknown because both a height and weight (above a minimum accepted value) are required for this calculation.  Scheduled Medications:     . aspirin  324 mg Oral Once  .  morphine injection  4 mg Intravenous Once  .  morphine injection  4 mg Intravenous Once     Assessment:  76 y.o. female admitted on 03/14/2011, with r/o ACS vs. possible pulmonary embolism. Plan for VQ scan. Pharmacy consulted to manage IV heparin.  Goal of Therapy:  1. Heparin level 0.3-0.7 units/ml   Plan:  1. Heparin IV bolus of 4000 units x 1, then IV infusion of 1000 units/hr.  2. Heparin level in 8 hours.  3. Daily CBC, heparin level.   Dineen Kid Thad Ranger, PharmD 03/14/2011, 7:34 AM

## 2011-03-14 NOTE — ED Notes (Signed)
[facility] will transport pt home.

## 2011-03-14 NOTE — ED Notes (Signed)
Pt meal tray was ordered [facility]

## 2011-03-14 NOTE — ED Notes (Signed)
Called [facility] to transport patient to her home.

## 2011-03-14 NOTE — ED Notes (Signed)
Social work at [employer]

## 2011-03-14 NOTE — ED Notes (Addendum)
Spoke with Onalee Hua (pts ride), he was not aware that he was suppose to come get pt. And he was not able to get her today because he did not have the capability to take care of her.  [phone]

## 2011-03-14 NOTE — Progress Notes (Signed)
MSW called per pt request.   Per chart review and discussion with RN, pt is cleared for discharge and has refused [facility] transportation home. MSW met with pt to address concern.  MSW and pt engaged in circular discussion surrounding transportation and the limitations to the services via [facility] ED.  Further probing prompted pt to verbalize she has not followed up on resources/application addressed with her on 02/22/2010 via MSW colleague. Pt expressed this situation to make this a priority. Pt confirmed she has the contact numbers and applications at home and is with understanding of who to call for assistance. Pts friend/caregiver called and per pt he is not able to assist with transportation. MSW offered to call a taxi for which she will pay for or to allow [facility] to be called.  Pt agreed to [facility] transportation for ETA at 1300.  RN updated on above. Pt verbalized no further MSW needs. Pt confirmed to have the keys to her house as well as the appropriate DME for ambulatory needs.    [name] MSW [facility] Emergency Dept. Weekend/Social Worker [phone]

## 2011-03-14 NOTE — ED Notes (Signed)
Called [facility] to cancel ride home, patient refused to be transported by [facility].

## 2011-03-14 NOTE — ED Provider Notes (Signed)
History     CSN: [id number]  Arrival date & time 03/14/11  0054   First MD Initiated Contact with Patient 03/14/11 0131      Chief Complaint  Patient presents with  . Back Pain    (Consider location/radiation/quality/duration/timing/severity/associated sxs/prior treatment) HPI Pt was getting ready for bed before midnight and she started having pain in her jaw, back and neck.  She took NTG and called 911.  The symptoms resolved.    No nausea or shortness or breath.  Pt has CAD and has had stents by Dr Juanda Chance.  Pt had been fine until this evening regarding her heart.  Pt also while she has been here has had some pain in her right shoulder.  Pt is having pain in her right chest and right arm but that is different than the angina that she had earlier.  The shoulder pain increases with movement.  She was seen at an UC previously and was treated with muscle relaxants.  The pain had mostly resolved.  Past Medical History  Diagnosis Date  . Sleep apnea     insomnia  . Hyperlipidemia   . Hypertension   . Anxiety   . Chronic headache   . Osteoarthritis   . Panic attacks   . CAD (coronary artery disease)   . Malignant neoplasm of breast   . Depression     Past Surgical History  Procedure Date  . Cholecystectomy   . Right mastectomy   . Tonsillectomy   . Knee surgery   . US echocardiography 10/04/2006    EF 55-60%    No family history on file.  History  Substance Use Topics  . Smoking status: Never Smoker   . Smokeless tobacco: Not on file  . Alcohol Use: No    OB History    Grav Para Term Preterm Abortions TAB SAB Ect Mult Living                  Review of Systems  All other systems reviewed and are negative.    Allergies  Review of patient's allergies indicates no known allergies.  Home Medications   Current Outpatient Rx  Name Route Sig Dispense Refill  . AMITRIPTYLINE HCL 50 MG PO TABS Oral Take 100 mg by mouth at bedtime.     . ANASTROZOLE 1 MG PO TABS  Oral Take 1 tablet (1 mg total) by mouth daily. 90 tablet 4  . ASPIRIN 81 MG PO TBEC Oral Take 81 mg by mouth daily.      . ATORVASTATIN CALCIUM 40 MG PO TABS Oral Take 40 mg by mouth daily.     Marland Kitchen CALTRATE 600+D PLUS 600-400 MG-UNIT PO TABS Oral Take 1 tablet by mouth daily.      Marland Kitchen CAMPHOR 3.1 % EX CREA Apply externally Apply 1 application topically 2 (two) times daily as needed. Apply to joints as needed for pain    . CEPHALEXIN 500 MG PO CAPS Oral Take 1 capsule by mouth Twice daily. For toe infection    . CINNAMON 500 MG PO CAPS Oral Take 1,000 mg by mouth daily. Hold while in hospital    . CYCLOBENZAPRINE HCL 5 MG PO TABS Oral Take 1 tablet by mouth At bedtime as needed. Shoulder pain    . LORAZEPAM 2 MG PO TABS Oral Take 2 mg by mouth 2 (two) times daily as needed. Anxiety and panic attacks    . NITROGLYCERIN 0.4 MG SL SUBL Sublingual Place 1 tablet (  0.4 mg total) under the tongue every 5 (five) minutes as needed for chest pain. 25 tablet 2  . QUINAPRIL HCL 20 MG PO TABS Oral Take 20 mg by mouth at bedtime.       BP 105/40  Temp(Src) 98 F (36.7 C) (Oral)  Resp 14  SpO2 98%  Physical Exam  Nursing note and vitals reviewed. Constitutional: She appears well-nourished. No distress.  HENT:  Head: Normocephalic and atraumatic.  Right Ear: External ear normal.  Left Ear: External ear normal.  Eyes: Conjunctivae are normal. Right eye exhibits no discharge. Left eye exhibits no discharge. No scleral icterus.  Neck: Neck supple. No tracheal deviation present.  Cardiovascular: Normal rate, regular rhythm and intact distal pulses.   Pulmonary/Chest: Effort normal and breath sounds normal. No stridor. No respiratory distress. She has no wheezes. She has no rales. She exhibits tenderness (ttp right anterior chest).  Abdominal: Soft. Bowel sounds are normal. She exhibits no distension. There is no tenderness. There is no rebound and no guarding.  Musculoskeletal: She exhibits tenderness. She  exhibits no edema.       Right shoulder: She exhibits decreased range of motion and tenderness. She exhibits no deformity.       Lifting right arm increases her pain  Neurological: She is alert. She has normal strength. No sensory deficit. Cranial nerve deficit:  no gross defecits noted. She exhibits normal muscle tone. She displays no seizure activity. Coordination normal.  Skin: Skin is warm and dry. No rash noted.  Psychiatric: She has a normal mood and affect.    ED Course  Procedures (including critical care time)  Medications  cephALEXin (KEFLEX) 500 MG capsule (not administered)  cyclobenzaprine (FLEXERIL) 5 MG tablet (not administered)  Camphor (JOINTFLEX) 3.1 % CREA (not administered)  nitroGLYCERIN (NITROSTAT) SL tablet 0.4 mg (not administered)  nitroGLYCERIN 0.2 mg/mL in dextrose 5 % infusion (5 mcg/min Intravenous New Bag/Given 03/14/11 0358)  morphine 4 MG/ML injection 4 mg (4 mg Intravenous Given 03/14/11 0346)  aspirin chewable tablet 324 mg (324 mg Oral Given 03/14/11 0300)  morphine 4 MG/ML injection 4 mg (4 mg Intravenous Given 03/14/11 0630)    Labs Reviewed  CBC - Abnormal; Notable for the following:    WBC 11.9 (*)    All other components within normal limits  COMPREHENSIVE METABOLIC PANEL - Abnormal; Notable for the following:    Potassium 5.5 (*)    Glucose, Bld 108 (*)    Calcium 10.6 (*)    Alkaline Phosphatase 245 (*)    GFR calc non Af Amer 63 (*)    GFR calc Af Amer 73 (*)    All other components within normal limits  D-DIMER, QUANTITATIVE - Abnormal; Notable for the following:    D-Dimer, Quant 2.84 (*)    All other components within normal limits  PROTIME-INR  APTT  POCT I-STAT TROPONIN I  POCT I-STAT TROPONIN I  POCT I-STAT TROPONIN I  I-STAT TROPONIN I  I-STAT TROPONIN I  I-STAT TROPONIN I   Dg Chest Portable 1 View  03/14/2011  *RADIOLOGY REPORT*  Clinical Data: Muscle spasm down the right arm.  Pain radiating into the neck.  The patient is  unable to use the right side. History of right breast cancer.  PORTABLE CHEST - 1 VIEW  Comparison: 12/07/2010  Findings: Mild cardiac enlargement with normal pulmonary vascularity.  No focal airspace consolidation in the lungs.  No blunting of costophrenic angles.  No pneumothorax.  Degenerative changes in the  spine.  No significant change since previous study.  IMPRESSION: No evidence of active pulmonary disease.  Cardiac enlargement.  Original Report Authenticated By: Marlon Pel, M.D.     No diagnosis found.    MDM  Pt with anginal symptoms.  D dimer also elevated.  Unable to get adequate IV for CT so vq study has been ordered.  Discussed with Dr Jacky Kindle.  If vq does not suggest PE would like cardiology to evaluate considering her known history of CAD.        Celene Kras, MD 03/14/11 [phone]

## 2011-03-14 NOTE — ED Notes (Signed)
PER EMS- Pt c/o back pain, neck pain, and jaw pain. Pt took 4 NTG at home before EMS arrived. PT stable on arrival. Currently has no complaints upon arrival to hospital. Alertx4, NAD.

## 2011-03-14 NOTE — ED Notes (Signed)
This Clinical research associate placed a call to Onalee Hua to verify he would provide pt with transportation. Onalee Hua returned phone call informing staff he had not received a call from pt requesting transportation home and he was unable to pick pt up. Social worker has been contacted and is currently establishing transportation for pt

## 2011-03-15 ENCOUNTER — Ambulatory Visit (INDEPENDENT_AMBULATORY_CARE_PROVIDER_SITE_OTHER): Payer: Medicare Other | Admitting: Cardiology

## 2011-03-15 ENCOUNTER — Encounter: Payer: Self-pay | Admitting: Cardiology

## 2011-03-15 VITALS — BP 124/80 | HR 97 | Resp 16 | Ht 64.0 in | Wt 170.0 lb

## 2011-03-15 DIAGNOSIS — E785 Hyperlipidemia, unspecified: Secondary | ICD-10-CM

## 2011-03-15 DIAGNOSIS — I2510 Atherosclerotic heart disease of native coronary artery without angina pectoris: Secondary | ICD-10-CM

## 2011-03-15 DIAGNOSIS — R079 Chest pain, unspecified: Secondary | ICD-10-CM

## 2011-03-15 DIAGNOSIS — I10 Essential (primary) hypertension: Secondary | ICD-10-CM

## 2011-03-15 LAB — LIPID PANEL
Total CHOL/HDL Ratio: 3
VLDL: 14 mg/dL (ref 0.0–40.0)

## 2011-03-15 LAB — BASIC METABOLIC PANEL
GFR: 75.62 mL/min (ref >60.00)
Glucose, Bld: 88 mg/dL (ref 70–99)
Potassium: 4.6 mEq/L (ref 3.5–5.1)
Sodium: 140 mEq/L (ref 135–145)

## 2011-03-15 NOTE — Progress Notes (Signed)
HPI: Pleasant female for followup of coronary artery disease. Patient is status post PCI of her LAD in May of 2011. She had a drug-eluting stent at that time. There was no significant disease in her circumflex or right coronary artery. Her LV function was normal. She has had problems with recurrent atypical chest pain. A Myoview was performed in November of 2011 and showed an EF of 72% and normal perfusion. Repeat cardiac catheterization in May 2012 showed normal LV function with a patent stent in the LAD. No obstructive disease. Chest CT in May of 2012 showed no pulmonary embolus. Seen in the emergency room yesterday with atypical symptoms. Enzymes negative. VQ scan negative. K 5.5. Ca 10.6, Alk phos 245. Patient states Saturday evening she had mid back, neck and jaw pain. It lasted 25 minutes and resolved. No associated symptoms. No chest pain or other symptoms since. She denies exertional chest pain, dyspnea on exertion, pedal edema or syncope.   Current Outpatient Prescriptions  Medication Sig Dispense Refill  . amitriptyline (ELAVIL) 50 MG tablet Take 100 mg by mouth at bedtime.       Marland Kitchen anastrozole (ARIMIDEX) 1 MG tablet Take 1 tablet (1 mg total) by mouth daily.  90 tablet  4  . aspirin 81 MG EC tablet Take 81 mg by mouth daily.        Marland Kitchen atorvastatin (LIPITOR) 40 MG tablet Take 40 mg by mouth daily.       . Calcium Carbonate-Vit D-Min (CALTRATE 600+D PLUS) 600-400 MG-UNIT per tablet Take 1 tablet by mouth daily.        . Camphor (JOINTFLEX) 3.1 % CREA Apply 1 application topically 2 (two) times daily as needed. Apply to joints as needed for pain      . Cinnamon 500 MG capsule Take 1,000 mg by mouth daily. Hold while in hospital      . cyclobenzaprine (FLEXERIL) 5 MG tablet Take 1 tablet by mouth At bedtime as needed. Shoulder pain      . LORazepam (ATIVAN) 2 MG tablet Take 2 mg by mouth 2 (two) times daily as needed. Anxiety and panic attacks      . nitroGLYCERIN (NITROSTAT) 0.4 MG SL tablet  Place 1 tablet (0.4 mg total) under the tongue every 5 (five) minutes as needed for chest pain.  25 tablet  2  . quinapril (ACCUPRIL) 20 MG tablet Take 20 mg by mouth at bedtime.        No current facility-administered medications for this visit.   Facility-Administered Medications Ordered in Other Visits  Medication Dose Route Frequency Provider Last Rate Last Dose  . DISCONTD: heparin ADULT infusion 100 units/ml (25000 units/250 ml)  1,000 Units/hr Intravenous Continuous Peter A. Patrica Duel, MD      . DISCONTD: nitroGLYCERIN (NITROSTAT) SL tablet 0.4 mg  0.4 mg Sublingual Q5 min PRN Celene Kras, MD      . DISCONTD: nitroGLYCERIN 0.2 mg/mL in dextrose 5 % infusion  10 mcg/min Intravenous Titrated Celene Kras, MD   5 mcg/min at 03/14/11 0981     Past Medical History  Diagnosis Date  . Sleep apnea     insomnia  . Hyperlipidemia   . Hypertension   . Anxiety   . Chronic headache   . Osteoarthritis   . Panic attacks   . CAD (coronary artery disease)   . Malignant neoplasm of breast   . Depression   . Chronic chest pain     Past Surgical History  Procedure Date  . Cholecystectomy   . Right mastectomy   . Tonsillectomy   . Knee surgery   . US echocardiography 10/04/2006    EF 55-60%    History   Social History  . Marital Status: Divorced    Spouse Name: N/A    Number of Children: 2  . Years of Education: N/A   Occupational History  . Unemployed    Social History Main Topics  . Smoking status: Never Smoker   . Smokeless tobacco: Not on file  . Alcohol Use: No  . Drug Use: Not on file  . Sexually Active: Not on file   Other Topics Concern  . Not on file   Social History Narrative  . No narrative on file    ROS: no fevers or chills, productive cough, hemoptysis, dysphasia, odynophagia, melena, hematochezia, dysuria, hematuria, rash, seizure activity, orthopnea, PND, pedal edema, claudication. Remaining systems are negative.  Physical Exam: Well-developed  well-nourished in no acute distress.  Skin is warm and dry.  HEENT is normal.  Neck is supple. No thyromegaly.  Chest is clear to auscultation with normal expansion.  Cardiovascular exam is regular rate and rhythm.  Abdominal exam nontender or distended. No masses palpated. Extremities show no edema. neuro grossly intact  ECG 03/14/11 normal sinus rhythm, left axis deviation, no ST changes.

## 2011-03-15 NOTE — Assessment & Plan Note (Signed)
Blood pressure controlled. Continue present medications. Repeat potassium as it was elevated in the emergency room.

## 2011-03-15 NOTE — Patient Instructions (Signed)
Your physician wants you to follow-up in: ONE YEAR  You will receive a reminder letter in the mail two months in advance. If you don't receive a letter, please call our office to schedule the follow-up appointment.   Your physician recommends that you return for lab work in: TODAY  

## 2011-03-15 NOTE — Assessment & Plan Note (Signed)
Symptoms are atypical. Last catheterization showed no coronary disease. No further ischemia evaluation.

## 2011-03-15 NOTE — Assessment & Plan Note (Signed)
Continue aspirin and statin. 

## 2011-03-15 NOTE — Assessment & Plan Note (Signed)
Continue statin. Check lipids. Patient has a chronically elevated alkaline phosphatase which was again noted on recent laboratories. She will followup with her primary care physician for this issue. She states this has been noted in the past.

## 2011-03-16 ENCOUNTER — Encounter: Payer: Medicare Other | Admitting: Physical Therapy

## 2011-03-16 ENCOUNTER — Encounter: Payer: Self-pay | Admitting: *Deleted

## 2011-03-17 ENCOUNTER — Telehealth: Payer: Self-pay

## 2011-03-17 NOTE — Telephone Encounter (Signed)
Patient is requesting a copy of her x-ray she was seen by Dr. Nedra Hai on 02-22-11 please call patient when ready for pick-up.Marland Kitchen

## 2011-03-18 NOTE — Telephone Encounter (Signed)
Pt notified that xray is ready for pickup. 

## 2011-03-19 ENCOUNTER — Other Ambulatory Visit: Payer: Medicare Other | Admitting: Lab

## 2011-03-22 ENCOUNTER — Telehealth: Payer: Self-pay | Admitting: Cardiology

## 2011-03-22 NOTE — Telephone Encounter (Signed)
FU Call: Pt calling wanting let nurse know that pt was using c-pap and pt was unconsciously taking off mask in the middle of the night. Pt is no longer taking c-pap and is now on O2 at night.

## 2011-03-22 NOTE — Telephone Encounter (Signed)
Spoke with pt, questions answered.

## 2011-03-22 NOTE — Telephone Encounter (Signed)
Pt calling re having several quetions

## 2011-03-25 ENCOUNTER — Ambulatory Visit: Payer: Medicare Other | Admitting: Oncology

## 2011-04-05 ENCOUNTER — Other Ambulatory Visit: Payer: Self-pay | Admitting: Cardiology

## 2011-04-05 NOTE — Telephone Encounter (Signed)
Pt called to make sure we get this called in

## 2011-04-06 ENCOUNTER — Telehealth: Payer: Self-pay | Admitting: Cardiology

## 2011-04-06 ENCOUNTER — Other Ambulatory Visit: Payer: Self-pay | Admitting: *Deleted

## 2011-04-06 MED ORDER — CYCLOBENZAPRINE HCL 5 MG PO TABS: 5.0000 mg | ORAL_TABLET | Freq: Every evening | ORAL | Status: DC | PRN

## 2011-04-06 NOTE — Telephone Encounter (Signed)
New Problem   Patient would like return from nurse DM concerning medical treatment, she can be reached at [phone]

## 2011-04-07 NOTE — Telephone Encounter (Signed)
FU Call: pt calling wanting to speak with nurse/MD regarding pt medical treatment. Please return pt call to discuss further.

## 2011-04-08 NOTE — Telephone Encounter (Signed)
Spoke with pt, questions answered.

## 2011-04-08 NOTE — Telephone Encounter (Signed)
Pt rtn call to debra from yesterday, pls call

## 2011-04-20 ENCOUNTER — Telehealth: Payer: Self-pay | Admitting: Internal Medicine

## 2011-04-20 DIAGNOSIS — G4733 Obstructive sleep apnea (adult) (pediatric): Secondary | ICD-10-CM

## 2011-04-20 NOTE — Telephone Encounter (Signed)
I spoke with the patient and she states she has been having increased daytime sleepiness. She states she tried cpap but could not use this so she is on 2 liters oxygen at night. She is requesting to have an order sent to [facility] to have her oxygen checked again at night because she feels she might need more then 2 liters. Please advise. Carron Curie, CMA

## 2011-04-20 NOTE — Telephone Encounter (Signed)
Order- ONOX on her O2 at 2 L/M    Dx  OSA

## 2011-04-21 ENCOUNTER — Telehealth: Payer: Self-pay | Admitting: Cardiology

## 2011-04-21 NOTE — Telephone Encounter (Signed)
Spoke with pt, she reports having some chest pain this am but she thinks it was a panic attack. Reassurance given.

## 2011-04-21 NOTE — Telephone Encounter (Signed)
F/U  Patient returning nurse call, she can be reached at hm# [phone]

## 2011-04-21 NOTE — Telephone Encounter (Signed)
Fu call Pt was calling back again.

## 2011-04-21 NOTE — Telephone Encounter (Signed)
New msg Pt wants to to discuss panic attack with you

## 2011-04-21 NOTE — Telephone Encounter (Signed)
Will forward to Nurse Deliah Goody, RN.

## 2011-04-21 NOTE — Telephone Encounter (Signed)
New Msg: pt calling wanting to speak with [name]. Please return pt call to discuss further.

## 2011-04-22 ENCOUNTER — Telehealth: Payer: Self-pay | Admitting: Cardiology

## 2011-04-22 NOTE — Telephone Encounter (Signed)
Spoke with pt, questions answered.

## 2011-04-22 NOTE — Telephone Encounter (Signed)
New problem:  Patient calling does not know how explain her situation , but need for [name] to call her.

## 2011-04-23 ENCOUNTER — Telehealth: Payer: Self-pay | Admitting: Cardiology

## 2011-04-23 NOTE — Telephone Encounter (Signed)
Spoke with pt, she asked if we could give an order for home health for help with the cleaning of her house. Referred pt to PCP.

## 2011-04-23 NOTE — Telephone Encounter (Signed)
Pt calling re needing dr approval , will give more details when you call

## 2011-04-23 NOTE — Telephone Encounter (Signed)
Follow up from  previous call:  Patient calling wanted to say information not related to medication, will go into more details when nurse calls.

## 2011-04-26 NOTE — Telephone Encounter (Signed)
ONO order placed. ATC pt x 2. WCB later.

## 2011-04-27 NOTE — Telephone Encounter (Signed)
Pt aware on ONO to be done and will call if she does not here from [facility] in the next few days.

## 2011-04-27 NOTE — Telephone Encounter (Signed)
LMTCB

## 2011-04-28 ENCOUNTER — Telehealth: Payer: Self-pay | Admitting: Internal Medicine

## 2011-04-28 NOTE — Telephone Encounter (Signed)
Will forward to CDY as an FYI 

## 2011-04-28 NOTE — Telephone Encounter (Signed)
Called and spoke with patient and she stated that [facility] has contacted her and is bringing out the device Thurs morning (04/29/11).

## 2011-04-28 NOTE — Telephone Encounter (Signed)
Order for ONO placed on 04/26/11. Will forward to [facility] so they may check on this for the pt.

## 2011-04-29 ENCOUNTER — Telehealth: Payer: Self-pay | Admitting: Internal Medicine

## 2011-04-29 NOTE — Telephone Encounter (Signed)
I spoke with pt and she states she can't remember if she is suppose to use oxygen during her ono or not. I advised her according to the scheduling instructions she is suppose to use 2 liters. She voiced her understanding and had no questions

## 2011-04-30 ENCOUNTER — Telehealth: Payer: Self-pay | Admitting: Internal Medicine

## 2011-04-30 NOTE — Telephone Encounter (Signed)
I spoke with pt and she stated last night when she was trying to do her ONO the machine keep flashing red lights and wanted to know what to do. I advised her she needed to call [facility] and advise them of this. She stated she will and nothing further was needed

## 2011-05-12 ENCOUNTER — Telehealth: Payer: Self-pay | Admitting: Internal Medicine

## 2011-05-12 NOTE — Telephone Encounter (Signed)
I spoke with pt and she is requesting her ONO results. She states this was done last week. Pt aware CDY is out of the office this afternoon. Please advise Dr. Maple Hudson, thanks

## 2011-05-13 NOTE — Telephone Encounter (Signed)
Overnight oximetry done 05/04/11, while wearing 2 L O2, does still show some times when her oxygen level drops. Since she couldn't keep a CPAP mask on, the best approach for now is to continue as she is doing, just wearing her oxygen.

## 2011-05-17 ENCOUNTER — Telehealth: Payer: Self-pay | Admitting: Internal Medicine

## 2011-05-17 DIAGNOSIS — R0602 Shortness of breath: Secondary | ICD-10-CM

## 2011-05-17 NOTE — Telephone Encounter (Signed)
LMTCB

## 2011-05-17 NOTE — Telephone Encounter (Signed)
I spoke with patient about results and she verbalized understanding and had no questions 

## 2011-05-17 NOTE — Telephone Encounter (Signed)
Returning call.

## 2011-05-18 NOTE — Telephone Encounter (Signed)
I spoke with pt and she stated she got to thinking about her ONO results. She is wanting to know since her o2 level drops sometimes still during the night on 2L she is wanting to know if she can be bumped up to 3L to see if that helps. Please advise Dr. Maple Hudson, thanks

## 2011-05-18 NOTE — Telephone Encounter (Signed)
I spoke with pt and is aware order has been sent. Nothing further was needed 

## 2011-05-18 NOTE — Telephone Encounter (Signed)
Pt returned call.  [name] ° °

## 2011-05-18 NOTE — Telephone Encounter (Signed)
Returning call can be reached at [phone].[name] ° °

## 2011-05-18 NOTE — Telephone Encounter (Signed)
lmomtcb x1 for pt--order has been sent 

## 2011-05-18 NOTE — Telephone Encounter (Signed)
Per CY-ok to increased O2 sleep to 3L/M ---? [facility]

## 2011-05-18 NOTE — Telephone Encounter (Signed)
[email or web address]  

## 2011-05-19 ENCOUNTER — Telehealth: Payer: Self-pay | Admitting: Internal Medicine

## 2011-05-19 NOTE — Telephone Encounter (Signed)
I spoke with pt and she stated that her friend Onalee Hua came and put her oxygen on 3 liters for her and just wanted to make [place] aware of that. Nothing further was needed

## 2011-05-20 ENCOUNTER — Encounter: Payer: Self-pay | Admitting: Internal Medicine

## 2011-05-20 ENCOUNTER — Telehealth: Payer: Self-pay | Admitting: Cardiology

## 2011-05-20 NOTE — Telephone Encounter (Signed)
Spoke with pt, questions answered.

## 2011-05-20 NOTE — Telephone Encounter (Signed)
PT CALLING RE QUESTION SHE HAS ABOUT [facility], DIDN'T WANT TO ELABORATE

## 2011-06-01 ENCOUNTER — Telehealth: Payer: Self-pay | Admitting: Cardiology

## 2011-06-01 NOTE — Telephone Encounter (Signed)
New msg Pt wants to talk to you about her ankles swelling. Please call

## 2011-06-02 NOTE — Telephone Encounter (Signed)
Pt calling back re message she left yesterday, wants to know why she didn't receive a call back, and would like to be called asap today

## 2011-06-02 NOTE — Telephone Encounter (Signed)
Spoke with pt, she has a little swelling in the outer aspect of her ankle. She denies injury to the area or SOB. She was instructed to try to keep legs elevated and watch her salt. She will call back with further problems.

## 2011-06-15 ENCOUNTER — Telehealth: Payer: Self-pay | Admitting: Cardiology

## 2011-06-15 NOTE — Telephone Encounter (Signed)
New msg Pt wanted to tell you that her angina is ok. Please call back

## 2011-06-15 NOTE — Telephone Encounter (Signed)
Spoke with pt, she had an episode of chest discomfort. She took 3 NTG with relief. She did not have sweating or nausea. She has not had any more episodes since Friday night. She does not want to be seen just wanted to let us know. She had been out of amitriptyline and took that and s;ept better. She feels the chest discomfort was because she did not have the amitriptyline. She is using her 02 at night. She will call back with further problems.

## 2011-06-16 ENCOUNTER — Telehealth: Payer: Self-pay | Admitting: Cardiology

## 2011-06-16 NOTE — Telephone Encounter (Signed)
Please return call to patient at [phone] concerning unspecified matters.

## 2011-06-16 NOTE — Telephone Encounter (Signed)
Spoke with pt, QUESTIONS ANSWERED.

## 2011-06-16 NOTE — Telephone Encounter (Signed)
New Problem:     Patient returned your phone call.  Please call back. 

## 2011-06-17 ENCOUNTER — Telehealth: Payer: Self-pay | Admitting: Cardiology

## 2011-06-17 NOTE — Telephone Encounter (Signed)
Please return call to patient at [phone]  Patient has questions regarding unspecified medical care.

## 2011-06-17 NOTE — Telephone Encounter (Signed)
Fu call °Patient returning your call °

## 2011-06-17 NOTE — Telephone Encounter (Signed)
Spoke with pt, questions answered.

## 2011-06-25 ENCOUNTER — Telehealth: Payer: Self-pay | Admitting: Cardiology

## 2011-06-25 NOTE — Telephone Encounter (Signed)
New msg Pt called and wanted to talk to you . She would tell my why.

## 2011-06-25 NOTE — Telephone Encounter (Signed)
Spoke with pt, she had a spot of blood in the water of the toliet. There was no blood when she wiped and she is having no symptoms of UTI. She will call back if she has any other issues.

## 2011-07-01 ENCOUNTER — Encounter (INDEPENDENT_AMBULATORY_CARE_PROVIDER_SITE_OTHER): Payer: Self-pay | Admitting: General Surgery

## 2011-07-02 ENCOUNTER — Ambulatory Visit (INDEPENDENT_AMBULATORY_CARE_PROVIDER_SITE_OTHER): Payer: Medicare Other | Admitting: Surgery

## 2011-07-02 ENCOUNTER — Encounter (INDEPENDENT_AMBULATORY_CARE_PROVIDER_SITE_OTHER): Payer: Self-pay | Admitting: Surgery

## 2011-07-02 VITALS — BP 124/78 | HR 84 | Temp 97.3°F | Resp 16 | Ht 65.0 in | Wt 170.0 lb

## 2011-07-02 DIAGNOSIS — Z853 Personal history of malignant neoplasm of breast: Secondary | ICD-10-CM

## 2011-07-02 NOTE — Progress Notes (Signed)
NAME: [name]       DOB: August 03, 1935           DATE: 07/02/2011       MRN: [id number]   [name] is a 76 y.o.Marland Kitchenfemale who presents for routine followup of her Right stage II ILC diagnosed in 2002 and treated with mastectomy, chemo. She has no problems or concerns on either side.  PFSH: She has had no significant changes since the last visit here.  ROS: There have been no significant changes since the last visit here  EXAM: General: The patient is alert, oriented, generally healty appearing, NAD. Mood and affect are normal.  Breasts:  Right surgically absent, no evidence of local recurrence. Left is normal  Lymphatics: She has no axillary or supraclavicular adenopathy on either side.  Extremities: Full ROM of the surgical side with no lymphedema noted.  Data Reviewed: Mammogram in NOvember negative  Impression: Doing well, with no evidence of recurrent cancer or new cancer  Plan: Will continue to follow up on an annual basis here. She can have an MRI with next mammogram

## 2011-07-08 ENCOUNTER — Telehealth: Payer: Self-pay | Admitting: Oncology

## 2011-07-08 ENCOUNTER — Other Ambulatory Visit: Payer: Self-pay | Admitting: *Deleted

## 2011-07-08 DIAGNOSIS — Z853 Personal history of malignant neoplasm of breast: Secondary | ICD-10-CM

## 2011-07-08 DIAGNOSIS — E785 Hyperlipidemia, unspecified: Secondary | ICD-10-CM

## 2011-07-08 DIAGNOSIS — M949 Disorder of cartilage, unspecified: Secondary | ICD-10-CM

## 2011-07-08 DIAGNOSIS — G44209 Tension-type headache, unspecified, not intractable: Secondary | ICD-10-CM

## 2011-07-08 NOTE — Telephone Encounter (Signed)
S/w the pt and she is aware of her lab appt in june

## 2011-07-27 ENCOUNTER — Telehealth: Payer: Self-pay | Admitting: Cardiology

## 2011-07-27 NOTE — Telephone Encounter (Signed)
   New problem:  Patient calling c/o having pain in wrist. Ask patient did she contact her pcp , she stated no. Wanted Stanton Kidney to call her back

## 2011-07-28 NOTE — Telephone Encounter (Signed)
Left message for pt to call back  °

## 2011-07-29 NOTE — Telephone Encounter (Signed)
Spoke with pt, she is having a sharpe pain in the left arm that goes down into her wrist. There is no swelling in the arm and she can not relate the discomfort. She is having no chest pain or SOB. She has had some discomfort in the right arm from some problems she is having in her neck. She will cont to monitor and let me know if she cont to have problems.

## 2011-08-02 ENCOUNTER — Other Ambulatory Visit: Payer: Medicare Other | Admitting: Lab

## 2011-08-09 ENCOUNTER — Other Ambulatory Visit: Payer: Medicare Other | Admitting: Lab

## 2011-08-09 ENCOUNTER — Encounter (HOSPITAL_COMMUNITY): Payer: Self-pay | Admitting: Emergency Medicine

## 2011-08-09 ENCOUNTER — Emergency Department (HOSPITAL_COMMUNITY)
Admission: EM | Admit: 2011-08-09 | Discharge: 2011-08-09 | Disposition: A | Discharge status: Home / Self Care | Payer: Medicare Other | Attending: Emergency Medicine | Admitting: Emergency Medicine

## 2011-08-09 ENCOUNTER — Emergency Department (HOSPITAL_COMMUNITY): Payer: Medicare Other

## 2011-08-09 DIAGNOSIS — F3289 Other specified depressive episodes: Secondary | ICD-10-CM | POA: Insufficient documentation

## 2011-08-09 DIAGNOSIS — Z853 Personal history of malignant neoplasm of breast: Secondary | ICD-10-CM | POA: Insufficient documentation

## 2011-08-09 DIAGNOSIS — E785 Hyperlipidemia, unspecified: Secondary | ICD-10-CM | POA: Insufficient documentation

## 2011-08-09 DIAGNOSIS — G8929 Other chronic pain: Secondary | ICD-10-CM | POA: Insufficient documentation

## 2011-08-09 DIAGNOSIS — Z79899 Other long term (current) drug therapy: Secondary | ICD-10-CM | POA: Insufficient documentation

## 2011-08-09 DIAGNOSIS — M549 Dorsalgia, unspecified: Secondary | ICD-10-CM | POA: Insufficient documentation

## 2011-08-09 DIAGNOSIS — N83209 Unspecified ovarian cyst, unspecified side: Secondary | ICD-10-CM | POA: Insufficient documentation

## 2011-08-09 DIAGNOSIS — I2510 Atherosclerotic heart disease of native coronary artery without angina pectoris: Secondary | ICD-10-CM | POA: Insufficient documentation

## 2011-08-09 DIAGNOSIS — R1032 Left lower quadrant pain: Secondary | ICD-10-CM | POA: Insufficient documentation

## 2011-08-09 DIAGNOSIS — F329 Major depressive disorder, single episode, unspecified: Secondary | ICD-10-CM | POA: Insufficient documentation

## 2011-08-09 DIAGNOSIS — I10 Essential (primary) hypertension: Secondary | ICD-10-CM | POA: Insufficient documentation

## 2011-08-09 LAB — DIFFERENTIAL
Lymphocytes Relative: 17 % (ref 12–46)
Lymphs Abs: 1.9 10*3/uL (ref 0.7–4.0)
Monocytes Absolute: 0.8 10*3/uL (ref 0.1–1.0)
Monocytes Relative: 7 % (ref 3–12)
Neutro Abs: 7.9 10*3/uL — ABNORMAL HIGH (ref 1.7–7.7)
Neutrophils Relative %: 71 % (ref 43–77)

## 2011-08-09 LAB — URINALYSIS, ROUTINE W REFLEX MICROSCOPIC
Glucose, UA: NEGATIVE mg/dL
Hgb urine dipstick: NEGATIVE
Ketones, ur: NEGATIVE mg/dL
Protein, ur: NEGATIVE mg/dL
Urobilinogen, UA: 0.2 mg/dL (ref 0.0–1.0)

## 2011-08-09 LAB — COMPREHENSIVE METABOLIC PANEL
Alkaline Phosphatase: 239 U/L — ABNORMAL HIGH (ref 39–117)
BUN: 13 mg/dL (ref 6–23)
CO2: 22 mEq/L (ref 19–32)
Chloride: 103 mEq/L (ref 96–112)
Creatinine, Ser: 0.67 mg/dL (ref 0.50–1.10)
GFR calc non Af Amer: 84 mL/min — ABNORMAL LOW (ref >90)
Glucose, Bld: 110 mg/dL — ABNORMAL HIGH (ref 70–99)
Potassium: 4 mEq/L (ref 3.5–5.1)
Total Bilirubin: 0.5 mg/dL (ref 0.3–1.2)

## 2011-08-09 LAB — URINE MICROSCOPIC-ADD ON

## 2011-08-09 LAB — CBC
HCT: 39.4 % (ref 36.0–46.0)
Hemoglobin: 12.7 g/dL (ref 12.0–15.0)
MCHC: 32.2 g/dL (ref 30.0–36.0)
RBC: 4.82 MIL/uL (ref 3.87–5.11)
WBC: 11.2 10*3/uL — ABNORMAL HIGH (ref 4.0–10.5)

## 2011-08-09 LAB — LIPASE, BLOOD: Lipase: 21 U/L (ref 11–59)

## 2011-08-09 MED ORDER — ACETAMINOPHEN 325 MG PO TABS
650.0000 mg | ORAL_TABLET | Freq: Once | ORAL | Status: AC
  Administered 2011-08-09: 650 mg via ORAL
  Filled 2011-08-09: qty 2

## 2011-08-09 MED ORDER — HYDROCODONE-ACETAMINOPHEN 5-325 MG PO TABS: 1.0000 | ORAL_TABLET | Freq: Four times a day (QID) | ORAL | Status: AC | PRN

## 2011-08-09 MED ORDER — SODIUM CHLORIDE 0.9 % IV SOLN
1000.0000 mL | Freq: Once | INTRAVENOUS | Status: AC
  Administered 2011-08-09: 1000 mL via INTRAVENOUS

## 2011-08-09 MED ORDER — ONDANSETRON HCL 4 MG/2ML IJ SOLN
4.0000 mg | Freq: Once | INTRAMUSCULAR | Status: AC
  Administered 2011-08-09: 4 mg via INTRAVENOUS
  Filled 2011-08-09: qty 2

## 2011-08-09 MED ORDER — SODIUM CHLORIDE 0.9 % IV SOLN
1000.0000 mL | INTRAVENOUS | Status: DC
  Administered 2011-08-09: 1000 mL via INTRAVENOUS

## 2011-08-09 MED ORDER — IOHEXOL 300 MG/ML  SOLN
100.0000 mL | Freq: Once | INTRAMUSCULAR | Status: AC | PRN
  Administered 2011-08-09: 100 mL via INTRAVENOUS

## 2011-08-09 NOTE — ED Provider Notes (Signed)
History     CSN: [id number]  Arrival date & time 08/09/11  1308   First MD Initiated Contact with Patient 08/09/11 [phone]      Chief Complaint  Patient presents with  . Abdominal Pain  . Back Pain    (Consider location/radiation/quality/duration/timing/severity/associated sxs/prior treatment) HPI Patient present with 1 day of LLQ abdominal pain and some low back pain for the last week. She describes the pain in her abdomen as 8/10. She says it does not radiate and nothing in particular makes the pain better or worse. The pain is not exacerbated by eating. She has been able to eat and drink. She denies nausea, vomiting, but endorses one episode of diarrhea yesterday.  She denies blood in stool. She denies dysuria, urinary frequency, and hematuria. She denies fever, chills, night sweats.    Past Medical History  Diagnosis Date  . Sleep apnea     insomnia  . Hyperlipidemia   . Hypertension   . Anxiety   . Chronic headache   . Osteoarthritis   . Panic attacks   . CAD (coronary artery disease)   . Depression   . Chronic chest pain   . Malignant neoplasm of breast     right breast    Past Surgical History  Procedure Date  . Cholecystectomy   . Right mastectomy   . Tonsillectomy   . Knee surgery   . US echocardiography 10/04/2006    EF 55-60%    No family history on file.  History  Substance Use Topics  . Smoking status: Never Smoker   . Smokeless tobacco: Not on file  . Alcohol Use: No    OB History    Grav Para Term Preterm Abortions TAB SAB Ect Mult Living                  Review of Systems  Constitutional: Negative for fever, chills and fatigue.  Respiratory: Negative for cough, chest tightness and shortness of breath.   Cardiovascular: Negative for chest pain and leg swelling.  Gastrointestinal: Positive for abdominal pain and diarrhea. Negative for nausea, vomiting, constipation and blood in stool.  Genitourinary: Positive for flank pain. Negative for  dysuria, urgency, frequency and hematuria.  Skin: Negative for rash.  Neurological: Positive for weakness. Negative for light-headedness and numbness.    Allergies  Review of patient's allergies indicates no known allergies.  Home Medications   Current Outpatient Rx  Name Route Sig Dispense Refill  . AMITRIPTYLINE HCL 50 MG PO TABS Oral Take 100 mg by mouth at bedtime.     . ANASTROZOLE 1 MG PO TABS  daily.    . ASPIRIN 81 MG PO TBEC Oral Take 81 mg by mouth daily.      . ATORVASTATIN CALCIUM 40 MG PO TABS  TAKE 1 TABLET BY MOUTH EVERY DAY 30 tablet 8  . CALTRATE 600+D PLUS 600-400 MG-UNIT PO TABS Oral Take 1 tablet by mouth daily.      Marland Kitchen CAMPHOR 3.1 % EX CREA Apply externally Apply 1 application topically 2 (two) times daily as needed. Apply to joints as needed for pain    . CINNAMON 500 MG PO CAPS Oral Take 1,000 mg by mouth daily. Hold while in hospital    . LORAZEPAM 2 MG PO TABS Oral Take 2 mg by mouth 2 (two) times daily as needed. Anxiety and panic attacks    . NITROGLYCERIN 0.4 MG SL SUBL Sublingual Place 1 tablet (0.4 mg total) under the tongue  every 5 (five) minutes as needed for chest pain. 25 tablet 2  . QUINAPRIL HCL 20 MG PO TABS Oral Take 20 mg by mouth at bedtime.       BP 148/72  Pulse 90  Temp 98.3 F (36.8 C)  Resp 20  SpO2 98%  Physical Exam  Nursing note and vitals reviewed. Constitutional: She is oriented to person, place, and time. She appears well-developed and well-nourished. No distress.  HENT:  Head: Normocephalic and atraumatic.  Right Ear: External ear normal.  Left Ear: External ear normal.  Mouth/Throat: Mucous membranes are dry.  Eyes: Conjunctivae are normal. Right eye exhibits no discharge. Left eye exhibits no discharge. No scleral icterus.  Neck: Neck supple. No tracheal deviation present.  Cardiovascular: Normal rate, regular rhythm, normal heart sounds and intact distal pulses.   Pulmonary/Chest: Effort normal and breath sounds normal.  No stridor. No respiratory distress. She has no wheezes. She has no rales.  Abdominal: Soft. Bowel sounds are normal. She exhibits no distension. There is no tenderness. There is no rebound and no guarding.  Musculoskeletal: Normal range of motion. She exhibits no edema and no tenderness.  Neurological: She is alert and oriented to person, place, and time. She has normal strength. No sensory deficit. Cranial nerve deficit:  no gross defecits noted. She exhibits normal muscle tone. She displays no seizure activity. Coordination normal.  Skin: Skin is warm and dry. No rash noted.  Psychiatric: She has a normal mood and affect.    ED Course  Procedures (including critical care time)  Labs Reviewed  CBC - Abnormal; Notable for the following:    WBC 11.2 (*)     All other components within normal limits  DIFFERENTIAL - Abnormal; Notable for the following:    Neutro Abs 7.9 (*)     All other components within normal limits  COMPREHENSIVE METABOLIC PANEL - Abnormal; Notable for the following:    Glucose, Bld 110 (*)     Albumin 3.4 (*)     Alkaline Phosphatase 239 (*)     GFR calc non Af Amer 84 (*)     All other components within normal limits  URINALYSIS, ROUTINE W REFLEX MICROSCOPIC - Abnormal; Notable for the following:    Leukocytes, UA SMALL (*)     All other components within normal limits  URINE MICROSCOPIC-ADD ON - Abnormal; Notable for the following:    Squamous Epithelial / LPF FEW (*)     Bacteria, UA FEW (*)     All other components within normal limits  LIPASE, BLOOD  LACTIC ACID, PLASMA  URINE CULTURE   Ct Abdomen Pelvis W Contrast  08/09/2011  *RADIOLOGY REPORT*  Clinical Data: Left side abdominal pain, back pain, diarrhea, history coronary artery disease, hypertension, breast cancer  CT ABDOMEN AND PELVIS WITH CONTRAST  Technique:  Multidetector CT imaging of the abdomen and pelvis was performed following the standard protocol during bolus administration of intravenous  contrast. Sagittal and coronal MPR images reconstructed from axial data set.  Contrast: OMNIPAQUE IOHEXOL 300 MG/ML  SOLN Dilute oral contrast.  Comparison: 09/01/2005  Findings: Dependent atelectasis bilateral lower lobes. Gallbladder surgically absent. Small calcification posterior to the right lobe of the liver image 17 unchanged. Question 7 mm cyst inferior pole left kidney image 38. Liver, spleen, pancreas, kidneys, and adrenal glands otherwise normal appearance. Upper normal-sized perigastric node image 10.  Normal size inferior mediastinal node adjacent to esophagus image 6 unchanged. No additional intra-abdominal or intrapelvic adenopathy.  Unremarkable bladder, uterus, and right ovary. Normal appendix. Chronic cystic lesion left ovary containing a tiny wall calcification and a small septation, lesion overall measuring 4.9 x 4.3 x 3.6 cm, previously 3.6 x 2.8 by 2.8 cm.  Numerous pelvic phleboliths. Stomach and bowel loops normal appearance. No mass, adenopathy, free fluid or inflammatory process. Tiny umbilical hernia containing fat. Scattered degenerative disc disease changes thoracolumbar spine.  IMPRESSION: Tiny umbilical hernia. Scattered sized normal and upper normal-sized lymph nodes as above. Interval increase in size of a cystic lesion within the left ovary which contains a small wall calcification and a tiny septation; this is indeterminate and character and further evaluation by pelvic and transvaginal sonography is recommended.  Original Report Authenticated By: Lollie Marrow, M.D.      MDM  Patient presents emergent complaints of back pain and left-sided abdominal pain. Overall her exam was benign. She has a slight increase in her white blood cell count but otherwise unremarkable laboratory findings with the exception of alkaline phosphatase. CT scan does show signs of increased lymph nodes in a possible left ovarian cyst. Symptoms are not definitive for ovarian CA however followup is  indicated. I contact her primary doctor, Dr. Felipa Eth to make him  aware of the findings. I instructed the patient followup with him to arrange for an outpatient ultrasound and further evaluation.        Celene Kras, MD 08/09/11 [phone]

## 2011-08-09 NOTE — ED Notes (Signed)
Pt presenting to ed with c/o back pain and left side abdominal pain, pt states positive diarrhea x yesterday. Pt denies nausea and vomiting. Pt states back pain x 1 week pain. Pt denies chest pain at this time. Pt is alert and oriented

## 2011-08-09 NOTE — Discharge Instructions (Signed)
Ovarian Cyst An ovarian cyst is a sac filled with fluid or blood. This sac is attached to the ovary. Some cysts go away on their own. Other cysts need treatment.  HOME CARE   Only take medicine as told by your doctor.   Follow up with your doctor as told.  GET HELP RIGHT AWAY IF:   You develop sudden pain.   Your belly (abdomen) becomes large or puffy (swollen).   You have a hard time peeing (totally emptying your bladder).   You feel sick most of the time.   You have a temperature by mouth above 102 F (38.9 C), not controlled by medicine.   Your periods are late, not regular, or painful.   Your belly or pelvic pain does not go away.   You have pressure on your bladder.   You have pain during sex.   You feel fullness, pressure, or discomfort in your belly.   You lose weight for no reason.  MAKE SURE YOU:   Understand these instructions.   Will watch your condition.   Will get help right away if you are not doing well or get worse.  Document Released: 07/21/2007 Document Revised: 01/21/2011 Document Reviewed: 01/03/2009 [employer] Patient Information 2012 [employer]. 

## 2011-08-09 NOTE — ED Notes (Signed)
RUE:[id number]<JW> Expected date:08/09/11<BR> Expected time: 7:27 AM<BR> Means of arrival:<BR> Comments:<BR> Back pain

## 2011-08-09 NOTE — ED Notes (Signed)
ptar at bedside transporting patient

## 2011-08-10 LAB — URINE CULTURE
Colony Count: 15000
Culture  Setup Time: 201306241722

## 2011-08-11 ENCOUNTER — Telehealth: Payer: Self-pay | Admitting: Oncology

## 2011-08-11 NOTE — Telephone Encounter (Signed)
Pt called to r/s her lab appt

## 2011-08-12 ENCOUNTER — Telehealth: Payer: Self-pay | Admitting: Cardiology

## 2011-08-12 NOTE — Telephone Encounter (Signed)
Spoke with pt, she was seen in the ER for abdominal pain and low back pain. She has an ovarian cyst that has gotten larger. She has a follow up with her GYN Tuesday next week. Questions answered.

## 2011-08-12 NOTE — Telephone Encounter (Signed)
New msg Pt wants to talk to you about visit she had at ED on Monday. Please call

## 2011-08-13 ENCOUNTER — Other Ambulatory Visit (HOSPITAL_BASED_OUTPATIENT_CLINIC_OR_DEPARTMENT_OTHER): Payer: Medicare Other | Admitting: Lab

## 2011-08-13 DIAGNOSIS — C50419 Malignant neoplasm of upper-outer quadrant of unspecified female breast: Secondary | ICD-10-CM

## 2011-08-13 DIAGNOSIS — E785 Hyperlipidemia, unspecified: Secondary | ICD-10-CM

## 2011-08-13 DIAGNOSIS — Z853 Personal history of malignant neoplasm of breast: Secondary | ICD-10-CM

## 2011-08-13 DIAGNOSIS — Z1231 Encounter for screening mammogram for malignant neoplasm of breast: Secondary | ICD-10-CM

## 2011-08-13 DIAGNOSIS — M899 Disorder of bone, unspecified: Secondary | ICD-10-CM

## 2011-08-13 DIAGNOSIS — G44209 Tension-type headache, unspecified, not intractable: Secondary | ICD-10-CM

## 2011-08-13 LAB — CBC WITH DIFFERENTIAL/PLATELET
BASO%: 0.7 % (ref 0.0–2.0)
EOS%: 4.3 % (ref 0.0–7.0)
HCT: 40.5 % (ref 34.8–46.6)
LYMPH%: 17.4 % (ref 14.0–49.7)
MCH: 26.7 pg (ref 25.1–34.0)
MCHC: 32.7 g/dL (ref 31.5–36.0)
MCV: 81.8 fL (ref 79.5–101.0)
MONO#: 0.7 10*3/uL (ref 0.1–0.9)
MONO%: 7.1 % (ref 0.0–14.0)
NEUT%: 70.5 % (ref 38.4–76.8)
Platelets: 291 10*3/uL (ref 145–400)
RBC: 4.95 10*6/uL (ref 3.70–5.45)

## 2011-08-14 LAB — COMPREHENSIVE METABOLIC PANEL
ALT: 26 U/L (ref 0–35)
Alkaline Phosphatase: 241 U/L — ABNORMAL HIGH (ref 39–117)
CO2: 21 mEq/L (ref 19–32)
Creatinine, Ser: 0.74 mg/dL (ref 0.50–1.10)
Total Bilirubin: 0.6 mg/dL (ref 0.3–1.2)

## 2011-08-14 LAB — VITAMIN D 25 HYDROXY (VIT D DEFICIENCY, FRACTURES): Vit D, 25-Hydroxy: 32 ng/mL (ref 30–89)

## 2011-08-17 ENCOUNTER — Telehealth: Payer: Self-pay | Admitting: Cardiology

## 2011-08-17 NOTE — Telephone Encounter (Signed)
Pt calling re lab work that she had done at the cancer center last friday and leaving for an appt at 1130am today

## 2011-08-18 ENCOUNTER — Telehealth: Payer: Self-pay | Admitting: Cardiology

## 2011-08-18 NOTE — Telephone Encounter (Signed)
New Problem:    Patient would like to speak with you as soon as possible about her ultra sound.  Please call back.

## 2011-08-18 NOTE — Telephone Encounter (Signed)
Spoke with pt, questions regarding labs answered. 

## 2011-08-18 NOTE — Telephone Encounter (Signed)
Spoke with pt, questions regarding constipation answered.

## 2011-09-08 ENCOUNTER — Telehealth: Payer: Self-pay | Admitting: Cardiology

## 2011-09-08 NOTE — Telephone Encounter (Signed)
Pt calling re lab work °

## 2011-09-08 NOTE — Telephone Encounter (Signed)
Spoke with pt, questions regarding lab work answered. °

## 2011-09-09 ENCOUNTER — Telehealth: Payer: Self-pay | Admitting: Cardiology

## 2011-09-09 NOTE — Telephone Encounter (Signed)
Spoke with pt, she reports she is having jaw pain and back pain, between her shoulder blades. She has taken 3 NTG and the jaw pain is gone but the back is still hurting. She states it started about 20 min ago. Pt advised to call EMS for eval at the ER

## 2011-09-09 NOTE — Telephone Encounter (Signed)
New problem:  Patient calling need to discuss heart disease.

## 2011-09-10 ENCOUNTER — Telehealth: Payer: Self-pay | Admitting: Cardiology

## 2011-09-10 NOTE — Telephone Encounter (Signed)
Follow-up:    Patient called back by her phone. Please call back.

## 2011-09-10 NOTE — Telephone Encounter (Signed)
Pt needs to talk you about her angina attack pls call

## 2011-09-10 NOTE — Telephone Encounter (Signed)
Spoke with pt, she did not go to the ER for eval, she got better. She thinks she is aware of what caused the discomfort. She thinks it was related to stress and anxiety. She is feeling better at this time. She will call back with further problems.

## 2011-09-10 NOTE — Telephone Encounter (Signed)
Left message for pt to call.

## 2011-09-13 ENCOUNTER — Ambulatory Visit: Payer: Medicare Other | Admitting: Internal Medicine

## 2011-09-17 ENCOUNTER — Telehealth: Payer: Self-pay | Admitting: Internal Medicine

## 2011-09-17 MED ORDER — AMOXICILLIN 500 MG PO CAPS: 500.0000 mg | ORAL_CAPSULE | Freq: Three times a day (TID) | ORAL | Status: AC

## 2011-09-17 NOTE — Telephone Encounter (Signed)
Called, spoke with pt who is concerned about her throat because she has a history of breast cancer.  States for the past couple of nights she feels a dry, raw feeling lower in her throat x 2 nights.  Also, states she has a runny nose off and on for the past couple of days and this morning states it "hurt" when she was eating her cereal.  She reports she felt like this was going down her windpipe.  Pt is very concerned about her symptoms with her hx of breast cancer.  She wonders if this could be coming from using o2 qhs or if there could be something else going on.  Pt has a pending OV with Dr. Maple Hudson on Tuesday - she would like to keep this appt - but is requesting further insight from Dr. Maple Hudson today.  She denies any increased SOB, wheezing, chest pain, chest tightness, or trouble swallowing.  Dr. Maple Hudson, pls advise.  Thank you.

## 2011-09-17 NOTE — Telephone Encounter (Signed)
Offer amoxacillin 500 mg, # 21, 1 three times daily 

## 2011-09-17 NOTE — Telephone Encounter (Signed)
Pt aware that Rx sent 

## 2011-09-20 ENCOUNTER — Telehealth: Payer: Self-pay | Admitting: Internal Medicine

## 2011-09-20 NOTE — Telephone Encounter (Signed)
lmomtcb x1 

## 2011-09-20 NOTE — Telephone Encounter (Signed)
Pt set to see CY on 09-22-11 at 9:30am. Carron Curie, CMA

## 2011-09-20 NOTE — Telephone Encounter (Signed)
Pt returned call.  [name] ° °

## 2011-09-21 ENCOUNTER — Ambulatory Visit: Payer: Medicare Other | Admitting: Internal Medicine

## 2011-09-22 ENCOUNTER — Ambulatory Visit (INDEPENDENT_AMBULATORY_CARE_PROVIDER_SITE_OTHER): Payer: Medicare Other | Admitting: Internal Medicine

## 2011-09-22 ENCOUNTER — Encounter: Payer: Self-pay | Admitting: Internal Medicine

## 2011-09-22 VITALS — BP 122/78 | HR 95 | Ht 62.0 in

## 2011-09-22 DIAGNOSIS — F411 Generalized anxiety disorder: Secondary | ICD-10-CM

## 2011-09-22 DIAGNOSIS — R0602 Shortness of breath: Secondary | ICD-10-CM

## 2011-09-22 DIAGNOSIS — J069 Acute upper respiratory infection, unspecified: Secondary | ICD-10-CM

## 2011-09-22 DIAGNOSIS — G4700 Insomnia, unspecified: Secondary | ICD-10-CM

## 2011-09-22 NOTE — Patient Instructions (Addendum)
Please call as needed  Continue oxygen for sleep.

## 2011-09-22 NOTE — Progress Notes (Signed)
Patient ID: [name], female    DOB: 19-Dec-1935, 76 y.o.   MRN: [id number]  HPI 08/20/10- 49 yoF never smoker followed for OSA, dyspnea, rhinitis, complicated by mental health issues( Dr Evelene Croon) and by hx right mastectomy/ breast Ca Last here 02/05/10- note reviewed. She continues CPAP. Download showed use 3.5 hrs/ night. She says she pulls it off in her sleep.  The download indicated good control AHI 3.9, at 12 cwp best pressure. She seems motivated to continue. She denies waking much during the night.  Since last here she went hospital for cath due to recurrent chest pain- told no coronary blockage.  She no longer has home oxygen. Overnight oximetry on CPAP/ room air 07/21/10- 2 minutes, 20 seconds with sat < 88%. This is good enough to leave O2 off as she wishes.   02/12/11-  75 yoF never smoker followed for OSA, dyspnea, rhinitis, complicated by mental health issues( Dr Evelene Croon) and by hx right mastectomy/ breast Ca Failed CPAP- kept pulling it off. She does wear oxygen all night at 2 L as intended and sleeps better with this. Watery rhinorrhea in the mornings. She declines flu vaccine. Has avoided recent respiratory infections. She has been following politics closely and was quite sharp about these issues.  09/22/11- 76 yoF never smoker followed for OSA, dyspnea, rhinitis, complicated by mental health issues( Dr Evelene Croon) and by hx right mastectomy/ breast Ca Acute visit-sore throat x7 days. Raw throat-hurts to swallow-was given Amoxicillin and has helped; runny nose during the day, cough at night. We had called in amoxicillin earlier and she says that helped. OSA- she discussed her CPAP intolerance with Dr.Kaur/ Psychiatry, who blamed anxiety for taking the mask off. She sleeps comfortably using oxygen. She referred to "female" who comes in and out of her house, adjusts her oxygen and other things but apparently has never been seen by anybody. She can just tell he's been there because she sees things have  been moved.  Review of Systems-see HPI Constitutional:   No-   weight loss, night sweats, fevers, chills, fatigue, lassitude. HEENT:   No-  headaches, difficulty swallowing, tooth/dental problems, +sore throat,       No-  sneezing, itching, ear ache, nasal congestion,  +post nasal drip,  CV:  No-   chest pain, orthopnea, PND, swelling in lower extremities, anasarca, dizziness, palpitations Resp: No- acute  shortness of breath with exertion or at rest, wearing oxygen.            No-   productive cough,  No non-productive cough,  No- coughing up of blood.              No-   change in color of mucus.  No- wheezing.   Skin: No-   rash or lesions. GI:  No-   heartburn, indigestion, abdominal pain, nausea, vomiting,  GU:  MS:  No-   joint pain or swelling.   Neuro-     nothing unusual Psych:  No- change in mood or affect. No acute depression or anxiety.  No memory loss.  Objective:   Physical Exam BP 122/78  Pulse 95  Ht 5\' 2"  (1.575 m)  SpO2 94% General- Alert, Oriented, Affect-appropriate, Distress- none acute, bright and alert, wheelchair, wearing winter hat Skin- rash-none, lesions- none, excoriation- none Lymphadenopathy- none Head- atraumatic            Eyes- Gross vision intact, PERRLA, conjunctivae clear secretions  Ears- Hearing, canals-normal            Nose- Clear, no-Septal dev, mucus, polyps, erosion, perforation             Throat- Mallampati II , mucosa clear , drainage- none, tonsils- atrophic Neck- flexible , trachea midline, no stridor , thyroid nl, carotid no bruit Chest - symmetrical excursion , unlabored           Heart/CV- RRR , no murmur , no gallop  , no rub, nl s1 s2                           - JVD- none , edema- none, stasis changes- none, varices- none           Lung- clear to P&A, wheeze- none, cough- none , dullness-none, rub- none           Chest wall- right mastectomy Abd-  Br/ Gen/ Rectal- Not done, not indicated Extrem- cyanosis- none,  clubbing, none, atrophy- none, strength- nl Neuro- grossly intact to observation

## 2011-09-23 ENCOUNTER — Telehealth: Payer: Self-pay | Admitting: Cardiology

## 2011-09-23 ENCOUNTER — Telehealth: Payer: Self-pay | Admitting: Internal Medicine

## 2011-09-23 NOTE — Telephone Encounter (Signed)
I spoke with pt and she stated she has a walker that has a seat and basket. The bars are too high up for her. [facility] and was advised the bars can be lowered. She stated she is going to check with her caregiver to see if he can do this first. Pt needed nothing from [place] further

## 2011-09-23 NOTE — Telephone Encounter (Signed)
Spoke with pt, the person that comes to her house to help her has gotten a job. She wonders if we have any info about people to help in her home. Will look for info and let her know.

## 2011-09-23 NOTE — Telephone Encounter (Signed)
ATCx2, line busy. WCB.Taysia Rivere, CMA  

## 2011-09-23 NOTE — Telephone Encounter (Signed)
Pt has medical questions and she wants to ask you the information she did not want to tell me anymore information just you can call when it's convenient for you

## 2011-09-24 NOTE — Telephone Encounter (Signed)
Spoke with pt, paperwork regarding in home help for patients mailed to pt home address.

## 2011-09-27 ENCOUNTER — Telehealth: Payer: Self-pay | Admitting: Internal Medicine

## 2011-09-27 NOTE — Telephone Encounter (Signed)
Will forward to CY as FYI and sign off on message per protocol.

## 2011-09-29 DIAGNOSIS — J069 Acute upper respiratory infection, unspecified: Secondary | ICD-10-CM | POA: Insufficient documentation

## 2011-09-29 NOTE — Assessment & Plan Note (Signed)
She does seem to keep her oxygen on and this appears to improve her quality of sleep

## 2011-09-29 NOTE — Assessment & Plan Note (Signed)
Educated on management of viral syndrome. I don't see exudates or significant red pharyngitis now and I don't think we need to put her on antibiotics. This is probably viral. She understands to call if she gets worse and we can consider an antibiotic. We discussed fluids and symptomatic treatment.

## 2011-09-29 NOTE — Assessment & Plan Note (Signed)
I don't know her formal psychiatric diagnosis. She has referred before to an unseen female she thinks comes in her home and makes changes, even if the door is locked. She doesn't seem to feel threatened. She dresses like a homeless person but seems quite intelligent and likes to talk about politics which she keeps up with.  I will cc Dr Evelene Croon .

## 2011-10-05 ENCOUNTER — Telehealth: Payer: Self-pay | Admitting: Internal Medicine

## 2011-10-05 NOTE — Telephone Encounter (Signed)
Called, spoke with pt.  States this morning she had a nosebleed from left nostril.  States it has stopped now.  Denies HA, blurred vision, or any other changes.  Would like to know what could have caused this as she has never had a nosebleed before.  Dr. Maple Hudson, pls advise.  Thank you.

## 2011-10-05 NOTE — Telephone Encounter (Signed)
[email or web address].Kalyn Dimattia, CMA  

## 2011-10-05 NOTE — Telephone Encounter (Signed)
Dryness and aspirin can cause this. A simple saline nasal spray may help keep her nose from drying out- use as needed.

## 2011-10-05 NOTE — Telephone Encounter (Signed)
Spoke with pt and notified of recs per CDY She verbalized understanding and states nothing further needed 

## 2011-10-14 ENCOUNTER — Telehealth: Payer: Self-pay | Admitting: Cardiology

## 2011-10-14 NOTE — Telephone Encounter (Signed)
Left message for pt to call.

## 2011-10-14 NOTE — Telephone Encounter (Signed)
Spoke with pt, questions regarding labs answered. 

## 2011-10-14 NOTE — Telephone Encounter (Signed)
New Problem:    Patient called in with a question about lab work but did not specify.  Please call back.

## 2011-10-18 ENCOUNTER — Telehealth: Payer: Self-pay | Admitting: Emergency Medicine

## 2011-10-18 NOTE — Telephone Encounter (Signed)
Pt called to report that her oxygen concentrator is alarming "needs service". She is on O2 for OSA, no longer uses CPAP. She notified [facility] - they are planning to come to fix it tomorrow. She wants to insure that it is safe to go without for one night. Given my review of the notes, I reassured her that she can sleep on RA tonight as long as they come to service the machine on 9/3.  Levy Pupa, MD, PhD 10/18/2011, 9:20 PM [facility] [phone] or if no answer [phone]

## 2011-10-29 ENCOUNTER — Telehealth: Payer: Self-pay | Admitting: Cardiology

## 2011-10-29 ENCOUNTER — Telehealth: Payer: Self-pay | Admitting: *Deleted

## 2011-10-29 ENCOUNTER — Telehealth: Payer: Self-pay

## 2011-10-29 DIAGNOSIS — E7800 Pure hypercholesterolemia, unspecified: Secondary | ICD-10-CM

## 2011-10-29 NOTE — Telephone Encounter (Signed)
Already talked with pt

## 2011-10-29 NOTE — Telephone Encounter (Signed)
New Problem:    Patient called in wanting to speak with you about a medical concern.  Please call back.

## 2011-10-29 NOTE — Telephone Encounter (Signed)
Spoke with pt, she has not had blood work done in [employer] and she wants to check here cholesterol and liver function. She is not due to see dr [name] until jan 2014. Okay with dr [name] to order labs

## 2011-10-29 NOTE — Telephone Encounter (Signed)
I have called patient to advise she should be okay. She was advised to watch for any signs of GI upset and to let us know if anything further is needed.

## 2011-10-29 NOTE — Telephone Encounter (Signed)
Pt has a medical concern to ask debra

## 2011-10-29 NOTE — Telephone Encounter (Signed)
PT WOULD LIKE TO SPEAK WITH SOMEONE TO MAKE SURE SHE WILL BE OK SINCE SHE FORGOT TO WASH HER HANDS AFTER CLEANING THE TOILET AND START EATING SOME WATERMELON. PLEASE CALL [phone]

## 2011-10-29 NOTE — Telephone Encounter (Signed)
PT LEFT MESSAGE ON VM REPORTING A "YELLOWISH TINGE ON UPPER PART OF STOMACH" THIS DESK NURSE TRIED TO RETURN CALL BUT PHONE WAS BUSY

## 2011-11-01 ENCOUNTER — Other Ambulatory Visit: Payer: Medicare Other

## 2011-11-18 ENCOUNTER — Telehealth: Payer: Self-pay | Admitting: Cardiology

## 2011-11-18 NOTE — Telephone Encounter (Signed)
New problem:  Calling concerning lab work .

## 2011-11-18 NOTE — Telephone Encounter (Signed)
Spoke with pt, questions regarding appt and labs answered.

## 2011-11-20 ENCOUNTER — Telehealth: Payer: Self-pay | Admitting: Pulmonary Disease

## 2011-11-20 NOTE — Telephone Encounter (Signed)
Pt called and having issue with cough upon lying down at night.  Has no cough during the day , and no increased sob. It is dry and hacking in nature.  No chest congestion.  She denies reflux symptoms, but does have tickle in throat.  Unsure if having PND.  I asked her to try chlorpheniramine 4mg   2 after dinner along with robitussin dm at bedtime.  Let us know if not getting  Better.

## 2011-11-24 ENCOUNTER — Telehealth: Payer: Self-pay | Admitting: Internal Medicine

## 2011-11-24 ENCOUNTER — Telehealth: Payer: Self-pay | Admitting: Cardiology

## 2011-11-24 NOTE — Telephone Encounter (Signed)
ATC, NA and no option to leave a msg, WCB 

## 2011-11-24 NOTE — Telephone Encounter (Signed)
New problem:  Can you recommend anything for pain. Patient states back pain during conversation.  will discuss when the nurse call.Marland Kitchen

## 2011-11-25 ENCOUNTER — Telehealth: Payer: Self-pay | Admitting: Cardiology

## 2011-11-25 MED ORDER — AMOXICILLIN 500 MG PO CAPS: 500.0000 mg | ORAL_CAPSULE | Freq: Three times a day (TID) | ORAL | Status: DC

## 2011-11-25 NOTE — Telephone Encounter (Signed)
F/U  Returning call back to Stanton Kidney, have additional information to give

## 2011-11-25 NOTE — Telephone Encounter (Signed)
Spoke with pt, questions answered.

## 2011-11-25 NOTE — Telephone Encounter (Signed)
Per CY-no need for ONOX now, okay to give Amoxicillin 500  Mg #21 take po tid no refills, and Delsym to soothe throat.

## 2011-11-25 NOTE — Telephone Encounter (Signed)
I spoke with pt and is aware of CDY recs. I have sent RX into the pharmacy. Nothing further was needed

## 2011-11-25 NOTE — Telephone Encounter (Signed)
Spoke with pt, okay given for pt to use tylenol for back pain. She will also try muscle cream.

## 2011-11-25 NOTE — Telephone Encounter (Signed)
Last OV 09/22/11, next OV 01/25/12. I spoke with the pt and she is c/o having a raw feeling in her throat below her adams apple. She states she is also having some hoarseness. Pt also c/o increase in dry cough and some PND as well all x 6 days.  Pt is anxious stating she fears this could be cancer. Also the pt wants to know if she is due for another ONO at this time. She states that something was mentioned on her last AVS. I do not see anything mentioned. Her last ONO was on 05-05-11. Please advise. Carron Curie, CMA No Known Allergies

## 2011-11-25 NOTE — Telephone Encounter (Signed)
ATCx1, line rang several times, no answer, no voicemail. WCB. Carron Curie, CMA

## 2011-12-07 ENCOUNTER — Other Ambulatory Visit: Payer: Self-pay | Admitting: Internal Medicine

## 2011-12-21 ENCOUNTER — Telehealth: Payer: Self-pay | Admitting: Internal Medicine

## 2011-12-21 NOTE — Telephone Encounter (Signed)
LMTCB

## 2011-12-21 NOTE — Telephone Encounter (Signed)
Spoke with patient-states she would like CY to consider her going on back on CPAP instead of O2 at night; states [place] can no longer get in where she has moved(CY would understand this). Pt states that since wearing just O2 she is waking up with sore throat, hoarseness, and nasal allergies. CY please advise. Thanks.

## 2011-12-22 NOTE — Telephone Encounter (Signed)
Pt states that she was in a lot of pain yesterday & that is why she called.  Pt states that she wasn't thinking clearly & wishes to address the issue(s) she called about during her next appt w/ CY.  [name]

## 2011-12-22 NOTE — Telephone Encounter (Signed)
Watch out for excessive dryness in home. Consider using a nasal saline gel, like AYR, in nose.  We can discuss sleep O2 vs CPAP when she has her next appointment in December.

## 2011-12-27 ENCOUNTER — Telehealth: Payer: Self-pay | Admitting: *Deleted

## 2011-12-27 NOTE — Telephone Encounter (Signed)
md will be on vac moved patient appointment to 03-14-2012 at 2:00pm mailed out letter and calendar to inform the patient of the new date and time

## 2011-12-31 ENCOUNTER — Other Ambulatory Visit: Payer: Medicare Other

## 2011-12-31 ENCOUNTER — Ambulatory Visit: Payer: Medicare Other

## 2011-12-31 ENCOUNTER — Telehealth: Payer: Self-pay | Admitting: Cardiology

## 2011-12-31 NOTE — Telephone Encounter (Signed)
New problem:   Status of lab work prior appt

## 2011-12-31 NOTE — Telephone Encounter (Signed)
Spoke with pt, questions regarding lab work done.

## 2012-01-03 ENCOUNTER — Other Ambulatory Visit: Payer: Medicare Other

## 2012-01-03 ENCOUNTER — Ambulatory Visit: Payer: Medicare Other

## 2012-01-14 ENCOUNTER — Telehealth: Payer: Self-pay | Admitting: Internal Medicine

## 2012-01-14 NOTE — Telephone Encounter (Signed)
After looking at his schedule he doesn't have anything until then.   Per CY - she will need to wait until her appointment for him to address this. She is aware of Dr. Maple Hudson recommendation.

## 2012-01-19 ENCOUNTER — Telehealth: Payer: Self-pay | Admitting: Cardiology

## 2012-01-19 NOTE — Telephone Encounter (Signed)
error 

## 2012-01-25 ENCOUNTER — Ambulatory Visit: Payer: Medicare Other | Admitting: Internal Medicine

## 2012-01-27 ENCOUNTER — Other Ambulatory Visit: Payer: Self-pay | Admitting: Oncology

## 2012-01-27 ENCOUNTER — Ambulatory Visit
Admission: RE | Admit: 2012-01-27 | Discharge: 2012-01-27 | Disposition: A | Discharge status: Home / Self Care | Payer: Medicare Other | Source: Ambulatory Visit | Attending: Oncology | Admitting: Oncology

## 2012-01-27 DIAGNOSIS — Z853 Personal history of malignant neoplasm of breast: Secondary | ICD-10-CM

## 2012-01-27 DIAGNOSIS — E559 Vitamin D deficiency, unspecified: Secondary | ICD-10-CM

## 2012-01-27 DIAGNOSIS — Z9011 Acquired absence of right breast and nipple: Secondary | ICD-10-CM

## 2012-01-27 DIAGNOSIS — C50919 Malignant neoplasm of unspecified site of unspecified female breast: Secondary | ICD-10-CM

## 2012-02-01 ENCOUNTER — Telehealth: Payer: Self-pay | Admitting: Cardiology

## 2012-02-01 NOTE — Telephone Encounter (Signed)
Pt wants to talk to you regarding her blood work

## 2012-02-01 NOTE — Telephone Encounter (Signed)
Spoke with pt, questions regarding lab work answered. °

## 2012-02-02 ENCOUNTER — Telehealth: Payer: Self-pay | Admitting: Internal Medicine

## 2012-02-02 ENCOUNTER — Telehealth: Payer: Self-pay | Admitting: Cardiology

## 2012-02-02 DIAGNOSIS — R0602 Shortness of breath: Secondary | ICD-10-CM

## 2012-02-02 NOTE — Telephone Encounter (Signed)
Pt having trouble with her oxygen level and wants to talk to you about it

## 2012-02-02 NOTE — Telephone Encounter (Signed)
Per CY-okay to place order for face mask O2 at 5l/m QHS only. Pt is aware and order placed Bjorn Loser is working on this now.

## 2012-02-02 NOTE — Telephone Encounter (Signed)
I called and spoke with pt. She is wanting to know how to keep her nasal cannula on her face. I advised pt to tighten the strap adjuster on the cannula up until she feels the cannula is tight enough on her face. She voiced her understanding and needed nothing further

## 2012-02-02 NOTE — Telephone Encounter (Signed)
Pt calling again in ref to previous msg can be reached at [phone].[name]

## 2012-02-02 NOTE — Telephone Encounter (Signed)
Spoke with patient-states that "[name]" has gotten back in to her home and keeps taking her nasal cannula off at night; is wondering if a face mask could help with this. Per CY-there needs to be a certain liter flow with face mask and for me to call Mayra Reel about this. I have left a message for her to call me about this.

## 2012-02-02 NOTE — Telephone Encounter (Signed)
Spoke with pt, questions answered.

## 2012-02-03 ENCOUNTER — Telehealth: Payer: Self-pay | Admitting: Internal Medicine

## 2012-02-03 NOTE — Telephone Encounter (Signed)
Called patient and pt stated that [facility] delivered the new concentrator and face mask about 20 minutes ago. Patient has received what has been order and is very appreciative. Phone note closed. Rhonda J Cobb

## 2012-02-03 NOTE — Telephone Encounter (Signed)
[name] stated that [facility] did contact patient yesterday and pt declined new concentrator and told [facility] is was ok to mail face mask. Pt told [facility] that she wouldn't be needing the new concentrator b/c she wasn't increasing her o2 liter flow from 2 lpm to 5 lpm with face mask. Explained to patient that in order for her to get the amount of o2 that she needs delivered through a mask that she must increase the liter flow, therefore, needing a new concentrator. Pt voiced understanding and order will be sent back through to [facility] to contact her today and arrange for the concentrator to be delivered today along with the face mask. LMOAM for [name] to call me. Rhonda J Cobb

## 2012-02-03 NOTE — Telephone Encounter (Signed)
It was my understanding that this was to be delivered last night as well. Called and spoke with [place]. She sent the order in and is in the process of calling to see what the status of this order is. Rhonda J Cobb

## 2012-02-04 ENCOUNTER — Ambulatory Visit (INDEPENDENT_AMBULATORY_CARE_PROVIDER_SITE_OTHER): Payer: Medicare Other | Admitting: Internal Medicine

## 2012-02-04 ENCOUNTER — Encounter: Payer: Self-pay | Admitting: Internal Medicine

## 2012-02-04 VITALS — BP 124/76 | HR 97 | Ht 62.0 in | Wt 180.0 lb

## 2012-02-04 DIAGNOSIS — G4730 Sleep apnea, unspecified: Secondary | ICD-10-CM

## 2012-02-04 DIAGNOSIS — F22 Delusional disorders: Secondary | ICD-10-CM

## 2012-02-04 DIAGNOSIS — G4700 Insomnia, unspecified: Secondary | ICD-10-CM

## 2012-02-04 NOTE — Patient Instructions (Addendum)
Let's stick with oxygen at 5 l/m through a mask during sleep. Try to keep mask on as you sleep.   We can reconsider options going forward.

## 2012-02-04 NOTE — Progress Notes (Signed)
Patient ID: [name], female    DOB: Jun 12, 1935, 76 y.o.   MRN: [id number]  HPI 08/20/10- 20 yoF never smoker followed for OSA, dyspnea, rhinitis, complicated by mental health issues( Dr Evelene Croon) and by hx right mastectomy/ breast Ca Last here 02/05/10- note reviewed. She continues CPAP. Download showed use 3.5 hrs/ night. She says she pulls it off in her sleep.  The download indicated good control AHI 3.9, at 12 cwp best pressure. She seems motivated to continue. She denies waking much during the night.  Since last here she went hospital for cath due to recurrent chest pain- told no coronary blockage.  She no longer has home oxygen. Overnight oximetry on CPAP/ room air 07/21/10- 2 minutes, 20 seconds with sat < 88%. This is good enough to leave O2 off as she wishes.   02/12/11-  75 yoF never smoker followed for OSA, dyspnea, rhinitis, complicated by mental health issues( Dr Evelene Croon) and by hx right mastectomy/ breast Ca Failed CPAP- kept pulling it off. She does wear oxygen all night at 2 L as intended and sleeps better with this. Watery rhinorrhea in the mornings. She declines flu vaccine. Has avoided recent respiratory infections. She has been following politics closely and was quite sharp about these issues.  09/22/11- 76 yoF never smoker followed for OSA, dyspnea, rhinitis, complicated by mental health issues( Dr Evelene Croon) and by hx right mastectomy/ breast Ca Acute visit-sore throat x7 days. Raw throat-hurts to swallow-was given Amoxicillin and has helped; runny nose during the day, cough at night. We had called in amoxicillin earlier and she says that helped. OSA- she discussed her CPAP intolerance with Dr.Kaur/ Psychiatry, who blamed anxiety for taking the mask off. She sleeps comfortably using oxygen. She referred to "female" who comes in and out of her house, adjusts her oxygen and other things but apparently has never been seen by anybody. She can just tell he's been there because she sees things have  been moved.  02/04/12- 76 yoF never smoker followed for OSA, dyspnea, rhinitis, complicated by mental health issues( Dr Evelene Croon) and by hx right mastectomy/ breast Ca FOLLOWS [id number] options of going back on CPAP vs just O2(states that she woke up again and "tutti frutti"( invisible man who moves her furniture and takes her mask off at night) took mask off-she was able to put back on and continue sleeping) She declines flu vaccine. She had asked to sleep with a simple mask and oxygen- through [facility], this required 5 L. She has been taping on her mask at night but still finds it pulled off. Security officers have checked her house and under her bed but don't see him because "he curls up under the blanket". CXR 1/27/ 13 IMPRESSION:  No evidence of active pulmonary disease. Cardiac enlargement.  Original Report Authenticated By: Marlon Pel, M.D.   Review of Systems-see HPI Constitutional:   No-   weight loss, night sweats, fevers, chills, fatigue, lassitude. HEENT:   No-  headaches, difficulty swallowing, tooth/dental problems, +sore throat,       No-  sneezing, itching, ear ache, nasal congestion,  +post nasal drip,  CV:  No-   chest pain, orthopnea, PND, swelling in lower extremities, anasarca, dizziness, palpitations Resp: No- acute  shortness of breath with exertion or at rest, wearing oxygen.            No-   productive cough,  No non-productive cough,  No- coughing up of blood.  No-   change in color of mucus.  No- wheezing.   Skin: No-   rash or lesions. GI:  No-   heartburn, indigestion, abdominal pain, nausea, vomiting,  GU:  MS:  No-   joint pain or swelling.   Neuro-     nothing unusual Psych:  No- change in mood or affect. No acute depression or anxiety.  No memory loss.  Objective:    Physical Exam BP 124/76  Pulse 97  Ht 5\' 2"  (1.575 m)  Wt 180 lb (81.647 kg)  BMI 32.92 kg/m2  SpO2 97% General- Alert, Oriented, Affect-appropriate, Distress- none  acute, bright and alert, wheelchair, wearing winter hat. Obese Skin- rash-none, lesions- none, excoriation- none Lymphadenopathy- none Head- atraumatic            Eyes- Gross vision intact, PERRLA, conjunctivae clear secretions            Ears- Hearing, canals-normal            Nose- Clear, no-Septal dev, mucus, polyps, erosion, perforation             Throat- Mallampati II , mucosa clear , drainage- none, tonsils- atrophic Neck- flexible , trachea midline, no stridor , thyroid nl, carotid no bruit Chest - symmetrical excursion , unlabored           Heart/CV- RRR , no murmur , no gallop  , no rub, nl s1 s2                           - JVD- none , edema- none, stasis changes- none, varices- none           Lung- clear to P&A, wheeze- none, cough- none , dullness-none, rub- none           Chest wall- right mastectomy Abd-  Br/ Gen/ Rectal- Not done, not indicated Extrem- cyanosis- none, clubbing, none, atrophy- none, strength- nl Neuro- grossly intact to observation

## 2012-02-07 ENCOUNTER — Telehealth: Payer: Self-pay | Admitting: Internal Medicine

## 2012-02-07 DIAGNOSIS — G4733 Obstructive sleep apnea (adult) (pediatric): Secondary | ICD-10-CM

## 2012-02-07 NOTE — Telephone Encounter (Signed)
Order- DME- change O2 to 2-3 L/M via nasal prongs during sleep.

## 2012-02-07 NOTE — Telephone Encounter (Signed)
ATC pt x2, line busy. WCB. Carron Curie, CMA

## 2012-02-07 NOTE — Telephone Encounter (Signed)
Spoke with pt She states that she has been having trouble with her o2 mask  It is "too flimsy" and falls off during the night She states that she decreased the flow to 3 and has been using nasal cannula with better sleep Please advise thanks

## 2012-02-08 NOTE — Telephone Encounter (Signed)
Order placed. [name], CMA  

## 2012-02-10 ENCOUNTER — Telehealth: Payer: Self-pay | Admitting: Internal Medicine

## 2012-02-10 DIAGNOSIS — G4730 Sleep apnea, unspecified: Secondary | ICD-10-CM

## 2012-02-10 NOTE — Telephone Encounter (Signed)
Per CY-ok DME-notify [facility] she is no longer using O2 mask just nasal O2 at 3L/M.

## 2012-02-10 NOTE — Telephone Encounter (Signed)
Spoke with patient, she states she is no longer using the mask with her o2.  Patient requesting [facility] be contacted to let them know she is no longer using this so she will not be charged. Dr. Maple Hudson is this ok to do, please advise thank you!

## 2012-02-10 NOTE — Telephone Encounter (Signed)
Called spoke with patient, advised CY okayed for the order to be sent to [facility].  Order placed as documented below.

## 2012-02-11 ENCOUNTER — Telehealth: Payer: Self-pay | Admitting: Internal Medicine

## 2012-02-11 ENCOUNTER — Telehealth: Payer: Self-pay | Admitting: *Deleted

## 2012-02-11 DIAGNOSIS — R1310 Dysphagia, unspecified: Secondary | ICD-10-CM

## 2012-02-11 NOTE — Telephone Encounter (Signed)
patient confirmed over the phone the appointment has been cancelled 

## 2012-02-11 NOTE — Telephone Encounter (Signed)
Would you be okay with doing this referral? Please advise.

## 2012-02-14 NOTE — Telephone Encounter (Signed)
What reason does she have for wanting GI referral for endoscopy??

## 2012-02-14 NOTE — Telephone Encounter (Signed)
Spoke with pt She states that the reason for wanting GI referral for endo is due to occ dysphagia and "raw feeling" in her "windpipe" Please advise if okay to refer, thanks!

## 2012-02-14 NOTE — Telephone Encounter (Signed)
Pt returned call. Kathleen W Perdue  

## 2012-02-14 NOTE — Telephone Encounter (Signed)
LMTCB X!1Per CY Suggest we order  Barium swallow DX dysphagia & esophagitis  Order placed

## 2012-02-14 NOTE — Telephone Encounter (Signed)
Spoke with pt and notified of recs per CDY She verbalized understanding and states nothing further needed 

## 2012-02-15 DIAGNOSIS — F22 Delusional disorders: Secondary | ICD-10-CM | POA: Insufficient documentation

## 2012-02-15 NOTE — Assessment & Plan Note (Signed)
I don't know her formal diagnosis. She believes in a female intruder "tutti-frutti", whot does her no physical harm, but picks locks, has followed her with change of address,  moves her furniture, takes her medicines and takes her masks off while she sleeps.

## 2012-02-15 NOTE — Assessment & Plan Note (Signed)
Obstructive sleep apnea but intolerant of CPAP. Her delusional intruder is blamed for pulling the mask off.. we can try sticking with a simple mask requiring 5 L oxygen flow but if she doesn't tolerate this we will go back to 3 L nasal prongs as our best compromise.

## 2012-02-17 ENCOUNTER — Ambulatory Visit (HOSPITAL_COMMUNITY)
Admission: RE | Admit: 2012-02-17 | Discharge: 2012-02-17 | Disposition: A | Discharge status: Home / Self Care | Payer: Medicare Other | Source: Ambulatory Visit | Attending: Internal Medicine | Admitting: Internal Medicine

## 2012-02-17 DIAGNOSIS — K209 Esophagitis, unspecified without bleeding: Secondary | ICD-10-CM | POA: Insufficient documentation

## 2012-02-17 DIAGNOSIS — R1310 Dysphagia, unspecified: Secondary | ICD-10-CM

## 2012-02-21 ENCOUNTER — Telehealth: Payer: Self-pay | Admitting: Internal Medicine

## 2012-02-21 ENCOUNTER — Ambulatory Visit: Payer: Medicare Other | Admitting: Internal Medicine

## 2012-02-21 NOTE — Telephone Encounter (Signed)
Spoke with patient- aware to call [facility] and see what options they have as far as travel O2. Pt states she will call [facility] tomorrow about this.

## 2012-02-21 NOTE — Telephone Encounter (Signed)
ATC several times-line busy. Will need to try again later.

## 2012-02-21 NOTE — Telephone Encounter (Signed)
Spoke with patient informed her results/recs as listed below per Dr. Maple Hudson. Patient verbalized understanding and nothing further needed at this time.    Result Note     Barium swallow- food doesn't move through esophagus as quickly as normal- muscles don't work as well as they used to- but no blockage. Advise chew and swallow carefully. Don't lie down for at least an hour after eating.

## 2012-02-21 NOTE — Progress Notes (Signed)
Quick Note:  Spoke with patient informed her results/recs as listed below per Dr. Young. Patient verbalized understanding and nothing further needed at this time.  ______ 

## 2012-02-21 NOTE — Progress Notes (Signed)
Quick Note:  Spoke with patient informed her results/recs as listed below per Dr. Maple Hudson. Patient verbalized understanding and nothing further needed at this time.  ______

## 2012-02-22 ENCOUNTER — Telehealth: Payer: Self-pay

## 2012-02-22 NOTE — Telephone Encounter (Signed)
Dr Perrin Maltese is not accepting new medicare patients. I have discussed with him, if she needs order for the PT, she can come here to be seen at Urgent care, can not give order for home PT without a visit here. She is given his hours at the Urgent care.

## 2012-02-22 NOTE — Telephone Encounter (Signed)
Pt is requesting an order for home PT - [name] - FAX to [phone]  [employer]  [id number]  Has an appointment with dr. Perrin Maltese in February and would like him to be her primary md.

## 2012-02-23 ENCOUNTER — Other Ambulatory Visit: Payer: Medicare Other | Admitting: Lab

## 2012-02-24 ENCOUNTER — Telehealth: Payer: Self-pay | Admitting: Internal Medicine

## 2012-02-24 DIAGNOSIS — R0602 Shortness of breath: Secondary | ICD-10-CM

## 2012-02-24 NOTE — Telephone Encounter (Signed)
Pt is trying to plan a trip out of town and need order sent to [facility] for portable oxygen to be used while staying motel.  Pt is currently using oxygen at 3L via nasal prongs at hs only.  Please advise if okay to send order.

## 2012-02-25 ENCOUNTER — Encounter: Payer: Self-pay | Admitting: Internal Medicine

## 2012-02-25 ENCOUNTER — Telehealth: Payer: Self-pay

## 2012-02-25 ENCOUNTER — Ambulatory Visit (INDEPENDENT_AMBULATORY_CARE_PROVIDER_SITE_OTHER): Payer: Medicare Other | Admitting: Internal Medicine

## 2012-02-25 ENCOUNTER — Other Ambulatory Visit: Payer: Self-pay

## 2012-02-25 ENCOUNTER — Ambulatory Visit: Payer: Medicare Other

## 2012-02-25 VITALS — BP 123/80 | HR 88 | Temp 98.1°F | Resp 16

## 2012-02-25 DIAGNOSIS — M79646 Pain in unspecified finger(s): Secondary | ICD-10-CM

## 2012-02-25 DIAGNOSIS — K589 Irritable bowel syndrome without diarrhea: Secondary | ICD-10-CM

## 2012-02-25 DIAGNOSIS — R269 Unspecified abnormalities of gait and mobility: Secondary | ICD-10-CM

## 2012-02-25 DIAGNOSIS — M79609 Pain in unspecified limb: Secondary | ICD-10-CM

## 2012-02-25 NOTE — Telephone Encounter (Signed)
Pt would like to talk with someone about getting a referral  Best number [phone]

## 2012-02-25 NOTE — Telephone Encounter (Signed)
Per CY---ok to send order for the portable oxygen per pts request. Order has been placed and i called and spoke with pt and she is aware.

## 2012-02-25 NOTE — Patient Instructions (Addendum)
Falling Fall Prevention and Home Safety Falls cause injuries and can affect all age groups. It is possible to prevent falls.  HOW TO PREVENT FALLS  Wear shoes with rubber soles that do not have an opening for your toes.  Keep the inside and outside of your house well lit.  Use night lights throughout your home.  Remove clutter from floors.  Clean up floor spills.  Remove throw rugs or fasten them to the floor with carpet tape.  Do not place electrical cords across pathways.  Put grab bars by your tub, shower, and toilet. Do not use towel bars as grab bars.  Put handrails on both sides of the stairway. Fix loose handrails.  Do not climb on stools or stepladders, if possible.  Do not wax your floors.  Repair uneven or unsafe sidewalks, walkways, or stairs.  Keep items you use a lot within reach.  Be aware of pets.  Keep emergency numbers next to the telephone.  Put smoke detectors in your home and near bedrooms. Ask your doctor what other things you can do to prevent falls. Document Released: 11/28/2008 Document Revised: 08/03/2011 Document Reviewed: 05/04/2011 [facility] Patient Information 2013 [place], [place]. Depression, Adult Depression refers to feeling sad, low, down in the dumps, blue, gloomy, or empty. In general, there are two kinds of depression: 1. Depression that we all experience from time to time because of upsetting life experiences, including the loss of a job or the ending of a relationship (normal sadness or normal grief). This kind of depression is considered normal, is short lived, and resolves within a few days to 2 weeks. (Depression experienced after the loss of a loved one is called bereavement. Bereavement often lasts longer than 2 weeks but normally gets better with time.) 2. Clinical depression, which lasts longer than normal sadness or normal grief or interferes with your ability to function at home, at work, and in school. It also interferes with  your personal relationships. It affects almost every aspect of your life. Clinical depression is an illness. Symptoms of depression also can be caused by conditions other than normal sadness and grief or clinical depression. Examples of these conditions are listed as follows:  Physical illness Some physical illnesses, including underactive thyroid gland (hypothyroidism), severe anemia, specific types of cancer, diabetes, uncontrolled seizures, heart and lung problems, strokes, and chronic pain are commonly associated with symptoms of depression.  Side effects of some prescription medicine In some people, certain types of prescription medicine can cause symptoms of depression.  Substance abuse Abuse of alcohol and illicit drugs can cause symptoms of depression. SYMPTOMS Symptoms of normal sadness and normal grief include the following:  Feeling sad or crying for short periods of time.  Not caring about anything (apathy).  Difficulty sleeping or sleeping too much.  No longer able to enjoy the things you used to enjoy.  Desire to be by oneself all the time (social isolation).  Lack of energy or motivation.  Difficulty concentrating or remembering.  Change in appetite or weight.  Restlessness or agitation. Symptoms of clinical depression include the same symptoms of normal sadness or normal grief and also the following symptoms:  Feeling sad or crying all the time.  Feelings of guilt or worthlessness.  Feelings of hopelessness or helplessness.  Thoughts of suicide or the desire to harm yourself (suicidal ideation).  Loss of touch with reality (psychotic symptoms). Seeing or hearing things that are not real (hallucinations) or having false beliefs about your life or  the people around you (delusions and paranoia). DIAGNOSIS  The diagnosis of clinical depression usually is based on the severity and duration of the symptoms. Your caregiver also will ask you questions about your  medical history and substance use to find out if physical illness, use of prescription medicine, or substance abuse is causing your depression. Your caregiver also may order blood tests. TREATMENT  Typically, normal sadness and normal grief do not require treatment. However, sometimes antidepressant medicine is prescribed for bereavement to ease the depressive symptoms until they resolve. The treatment for clinical depression depends on the severity of your symptoms but typically includes antidepressant medicine, counseling with a mental health professional, or a combination of both. Your caregiver will help to determine what treatment is best for you. Depression caused by physical illness usually goes away with appropriate medical treatment of the illness. If prescription medicine is causing depression, talk with your caregiver about stopping the medicine, decreasing the dose, or substituting another medicine. Depression caused by abuse of alcohol or illicit drugs abuse goes away with abstinence from these substances. Some adults need professional help in order to stop drinking or using drugs. SEEK IMMEDIATE CARE IF:  You have thoughts about hurting yourself or others.  You lose touch with reality (have psychotic symptoms).  You are taking medicine for depression and have a serious side effect. FOR MORE INFORMATION National Alliance on Mental Illness: www.nami.[name]: http://www.maynard.net/ Document Released: 01/30/2000 Document Revised: 08/03/2011 Document Reviewed: 05/03/2011 [facility] Patient Information 2013 [place], [place]. Irritable Bowel Syndrome Irritable Bowel Syndrome (IBS) is caused by a disturbance of normal bowel function. Other terms used are spastic colon, mucous colitis, and irritable colon. It does not require surgery, nor does it lead to cancer. There is no cure for IBS. But with proper diet, stress reduction, and medication, you will find that your  problems (symptoms) will gradually disappear or improve. IBS is a common digestive disorder. It usually appears in late adolescence or early adulthood. Women develop it twice as often as men. CAUSES  After food has been digested and absorbed in the small intestine, waste material is moved into the colon (large intestine). In the colon, water and salts are absorbed from the undigested products coming from the small intestine. The remaining residue, or fecal material, is held for elimination. Under normal circumstances, gentle, rhythmic contractions on the bowel walls push the fecal material along the colon towards the rectum. In IBS, however, these contractions are irregular and poorly coordinated. The fecal material is either retained too long, resulting in constipation, or expelled too soon, producing diarrhea. SYMPTOMS  The most common symptom of IBS is pain. It is typically in the lower left side of the belly (abdomen). But it may occur anywhere in the abdomen. It can be felt as heartburn, backache, or even as a dull pain in the arms or shoulders. The pain comes from excessive bowel-muscle spasms and from the buildup of gas and fecal material in the colon. This pain:  Can range from sharp belly (abdominal) cramps to a dull, continuous ache.  Usually worsens soon after eating.  Is typically relieved by having a bowel movement or passing gas. Abdominal pain is usually accompanied by constipation. But it may also produce diarrhea. The diarrhea typically occurs right after a meal or upon arising in the morning. The stools are typically soft and watery. They are often flecked with secretions (mucus). Other symptoms of IBS include:  Bloating.  Loss of appetite.  Heartburn.  Feeling sick to your stomach (nausea).  Belching  Vomiting  Gas. IBS may also cause a number of symptoms that are unrelated to the digestive system:  Fatigue.  Headaches.  Anxiety  Shortness of breath  Difficulty  in concentrating.  Dizziness. These symptoms tend to come and go. DIAGNOSIS  The symptoms of IBS closely mimic the symptoms of other, more serious digestive disorders. So your caregiver may wish to perform a variety of additional tests to exclude these disorders. He/she wants to be certain of learning what is wrong (diagnosis). The nature and purpose of each test will be explained to you. TREATMENT A number of medications are available to help correct bowel function and/or relieve bowel spasms and abdominal pain. Among the drugs available are:  Mild, non-irritating laxatives for severe constipation and to help restore normal bowel habits.  Specific anti-diarrheal medications to treat severe or prolonged diarrhea.  Anti-spasmodic agents to relieve intestinal cramps.  Your caregiver may also decide to treat you with a mild tranquilizer or sedative during unusually stressful periods in your life. The important thing to remember is that if any drug is prescribed for you, make sure that you take it exactly as directed. Make sure that your caregiver knows how well it worked for you. HOME CARE INSTRUCTIONS   Avoid foods that are high in fat or oils. Some examples WUJ:WJXBJ cream, butter, frankfurters, sausage, and other fatty meats.  Avoid foods that have a laxative effect, such as fruit, fruit juice, and dairy products.  Cut out carbonated drinks, chewing gum, and "gassy" foods, such as beans and cabbage. This may help relieve bloating and belching.  Bran taken with plenty of liquids may help relieve constipation.  Keep track of what foods seem to trigger your symptoms.  Avoid emotionally charged situations or circumstances that produce anxiety.  Start or continue exercising.  Get plenty of rest and sleep. MAKE SURE YOU:   Understand these instructions.  Will watch your condition.  Will get help right away if you are not doing well or get worse. Document Released: 02/01/2005  Document Revised: 04/26/2011 Document Reviewed: 09/22/2007 [facility] Patient Information 2013 [place], [place].

## 2012-02-25 NOTE — Telephone Encounter (Signed)
I spoke to her on 02/22/12 about home health, we can not make referrals/ treat patient without evaluating her in the office. I left message, I think this was old message and she has been advised she has to be seen.

## 2012-02-25 NOTE — Telephone Encounter (Signed)
Ok - order her DME  Ok to rent portable O2 concentrator 3L/ min for travel

## 2012-02-25 NOTE — Progress Notes (Signed)
  Subjective:    Patient ID: [name], female    DOB: Jul 06, 1935, 77 y.o.   MRN: [id number]  HPI 3 problems--needs help with weakness and difficulty walking, has pain in right thumb chronic, And IBS is constant problem does have a GI doctor. She needs names for new primary care doc. No injury hx.  Review of Systems     Objective:   Physical Exam  Vitals reviewed. Constitutional: She is oriented to person, place, and time. She appears well-nourished. No distress.  Eyes: EOM are normal. No scleral icterus.  Neck: Normal range of motion.  Cardiovascular: Normal rate, regular rhythm and normal heart sounds.   Pulmonary/Chest: Effort normal and breath sounds normal.  Abdominal: There is no tenderness.  Neurological: She is alert and oriented to person, place, and time. No cranial nerve deficit or sensory deficit. Coordination and gait abnormal.       Has depression and dx of delusions cared by Dr. Evelene Croon  Generalized weakness from just sitting and not moving around      [facility] An reading (PRIMARY) by  Dr.Jouri Threat.arthritic joint, no fx seen      Assessment & Plan:  Refer to [facility]/ Dr. Amanda Cockayne group. Probiotic and lorazepam for IBS Referr to [facility] for PT/gait training/strenthening

## 2012-02-27 ENCOUNTER — Telehealth: Payer: Self-pay

## 2012-02-27 NOTE — Telephone Encounter (Signed)
Pt would like to speak with the CMA that was with Dr.Guest on Friday, pt states that she had a "misunderstanding"  she was very rude when asked what the misundersatnding was so I could include it in the message.Please advise. Best# [phone]

## 2012-02-28 ENCOUNTER — Telehealth: Payer: Self-pay

## 2012-02-28 ENCOUNTER — Other Ambulatory Visit: Payer: Self-pay | Admitting: Internal Medicine

## 2012-02-28 NOTE — Telephone Encounter (Signed)
Patient is requesting an anti depressant for sleep (amatripaline)  She saw dr. Perrin Maltese last week and forgot to ask him.  She does not have another Doctor to ask.   [facility]N:  [phone]

## 2012-02-28 NOTE — Telephone Encounter (Signed)
nicole was with him

## 2012-02-28 NOTE — Telephone Encounter (Signed)
Patient requests renewal on Elavil, was previously taking it, not established with a primary care yet. Please advise. Dr Perrin Maltese has provided her with names of physicians, but not established yet.

## 2012-02-28 NOTE — Telephone Encounter (Signed)
Called patient to speak to her she states we have the best urgent care facility she has ever gone to and she is asking what was her referral for physical therapy going to consist of. I advised her it is for gait training and strengthening.

## 2012-02-28 NOTE — Telephone Encounter (Signed)
Pt also wants to talk with dr guest about the recent referral and the diagnosis that was given  Best number [phone]

## 2012-02-29 ENCOUNTER — Telehealth: Payer: Self-pay | Admitting: Oncology

## 2012-02-29 ENCOUNTER — Telehealth: Payer: Self-pay | Admitting: Cardiology

## 2012-02-29 ENCOUNTER — Encounter: Payer: Self-pay | Admitting: Oncology

## 2012-02-29 ENCOUNTER — Other Ambulatory Visit: Payer: Self-pay | Admitting: *Deleted

## 2012-02-29 MED ORDER — AMITRIPTYLINE HCL 50 MG PO TABS: 100.0000 mg | ORAL_TABLET | Freq: Every day | ORAL | Status: DC

## 2012-02-29 NOTE — Telephone Encounter (Signed)
I have authorized a 1 month supply of elavil, but she will need to follow up with either Dr. Perrin Maltese or whichever PCP she chooses from the names he gave her. Also, please get more info about her question on the referral

## 2012-02-29 NOTE — Telephone Encounter (Signed)
Not able to reach pt re 2/13 appt. Former pt of [facility] to [facility] (1/13 pof). Mailed appt w/reassignment letter.

## 2012-02-29 NOTE — Telephone Encounter (Signed)
Left message for pt to call.

## 2012-02-29 NOTE — Telephone Encounter (Signed)
Thanks, I tried to call patient, someone answered but did not speak, she must be having phone trouble, will try again later.

## 2012-02-29 NOTE — Telephone Encounter (Signed)
Pt wants to talk with debra re appt time Friday, I asked if I could help, but she wants to talk with debra

## 2012-02-29 NOTE — Telephone Encounter (Signed)
Spoke with pt, questions regarding appt Friday answered.

## 2012-03-01 ENCOUNTER — Other Ambulatory Visit: Payer: Medicare Other

## 2012-03-01 ENCOUNTER — Telehealth: Payer: Self-pay | Admitting: Cardiology

## 2012-03-01 ENCOUNTER — Ambulatory Visit: Payer: Medicare Other | Admitting: Oncology

## 2012-03-02 ENCOUNTER — Other Ambulatory Visit: Payer: Medicare Other | Admitting: Lab

## 2012-03-02 ENCOUNTER — Telehealth: Payer: Self-pay | Admitting: Medical Oncology

## 2012-03-02 ENCOUNTER — Telehealth (INDEPENDENT_AMBULATORY_CARE_PROVIDER_SITE_OTHER): Payer: Self-pay | Admitting: General Surgery

## 2012-03-02 DIAGNOSIS — Z853 Personal history of malignant neoplasm of breast: Secondary | ICD-10-CM

## 2012-03-02 NOTE — Telephone Encounter (Signed)
Line busy

## 2012-03-02 NOTE — Telephone Encounter (Signed)
Message copied by Liliana Cline on Thu Mar 02, 2012  1:55 PM ------      Message from: Zacarias Pontes      Created: Thu Mar 02, 2012  9:13 AM      Contact: [phone]       Pt needs MRI scheduled in Feb.This is her year to return for an apt for her br reck..Can you schedule and give her a call..thanks

## 2012-03-02 NOTE — Telephone Encounter (Signed)
Pt called and is concerned that she can not see Dr. Arline Asp in January. She is a former pt of Dr. Donnie Coffin and she is due her yearly follow up in Jan. I explained to the pt that we are doing our best to get the patients rescheduled according to treatments and follow ups. She is an eleven year survivor with a negative mammogram in Dec 2013. She is scheduled to see Dr. Arline Asp 03/30/12 but is unhappy with this date. I reassured her that we are doing our best to take care of her and if she has any new problems regarding her breast cancer before 03/30/12 to call us.

## 2012-03-02 NOTE — Telephone Encounter (Signed)
Order placed. [name] with [facility] will call to schedule.

## 2012-03-03 ENCOUNTER — Ambulatory Visit: Payer: Medicare Other | Admitting: Cardiology

## 2012-03-03 NOTE — Telephone Encounter (Signed)
Patient advised.

## 2012-03-06 ENCOUNTER — Telehealth: Payer: Self-pay | Admitting: Internal Medicine

## 2012-03-06 MED ORDER — DOXYCYCLINE HYCLATE 100 MG PO TABS: ORAL_TABLET | ORAL | Status: DC

## 2012-03-06 NOTE — Telephone Encounter (Signed)
msg closed by accident.

## 2012-03-06 NOTE — Telephone Encounter (Signed)
Per Dr. Maple Hudson-- offer doxycycline 100mg  #8 2 today then 1 daily.

## 2012-03-06 NOTE — Telephone Encounter (Addendum)
Called, spoke with pt.  Pt states she woke up this morning sneezing and has pain at the bottom of the inside of her neck that feels raw.  Also, has a nonprod cough and PND.  Denies sore throat, trouble swallowing, increased SOB.  States she is a cancer survivor and concerned about these symptoms.  Would like recs from Dr. Maple Hudson, pls advise.  Thank you.  [facility] [address]  nkda - verified with pt.  Last OV with [facility] 02/04/12

## 2012-03-06 NOTE — Telephone Encounter (Signed)
Pt aware of [facility] recs. She voiced her understanding and needed nothing further. RX ahs been sent

## 2012-03-06 NOTE — Addendum Note (Signed)
Addended by: Tommie Sams on: 03/06/2012 12:20 PM   Modules accepted: Orders

## 2012-03-07 ENCOUNTER — Telehealth: Payer: Self-pay

## 2012-03-07 ENCOUNTER — Other Ambulatory Visit: Payer: Medicare Other | Admitting: Lab

## 2012-03-07 DIAGNOSIS — R269 Unspecified abnormalities of gait and mobility: Secondary | ICD-10-CM

## 2012-03-07 NOTE — Telephone Encounter (Signed)
Pt is needing to talk with someone about getting a physical therapy appt to [name] at [facility] -his number is [phone] and fax number is [phone]  Best number for patient is [phone]

## 2012-03-07 NOTE — Addendum Note (Signed)
Addended byLiliana Cline on: 03/07/2012 12:06 PM   Modules accepted: Orders

## 2012-03-07 NOTE — Telephone Encounter (Signed)
Looks like this order was given when she was here to see Dr Perrin Maltese. Have put referral in, was not done at office visit. Faxed to [employer].

## 2012-03-09 ENCOUNTER — Ambulatory Visit: Payer: Medicare Other | Admitting: Oncology

## 2012-03-10 ENCOUNTER — Telehealth: Payer: Self-pay

## 2012-03-10 NOTE — Telephone Encounter (Signed)
[name] with [facility] (physical therapy) wants to let dr guest know that the pt has refused therapy b/c of insurance reasons  [name]: [phone]  bf

## 2012-03-14 ENCOUNTER — Ambulatory Visit: Payer: Medicare Other | Admitting: Oncology

## 2012-03-16 ENCOUNTER — Telehealth (INDEPENDENT_AMBULATORY_CARE_PROVIDER_SITE_OTHER): Payer: Self-pay | Admitting: General Surgery

## 2012-03-16 DIAGNOSIS — Z853 Personal history of malignant neoplasm of breast: Secondary | ICD-10-CM

## 2012-03-16 NOTE — Telephone Encounter (Signed)
Lab order entered into [facility].

## 2012-03-16 NOTE — Telephone Encounter (Signed)
Message copied by Liliana Cline on Thu Mar 16, 2012  1:17 PM ------      Message from: Marin Shutter      Created: Thu Mar 16, 2012 10:00 AM      Regarding: Dr Chelsea Aus from Radiology called and asked that you send orders for lab work (creatin - sp?).  Pt apparently has not had lab work done in a long time, and can't get an MRI without the labs.  Debby Bud [phone]

## 2012-03-17 ENCOUNTER — Ambulatory Visit: Payer: Medicare Other | Admitting: Cardiology

## 2012-03-20 ENCOUNTER — Telehealth: Payer: Self-pay | Admitting: Internal Medicine

## 2012-03-20 NOTE — Telephone Encounter (Signed)
She couldn't keep CPAP on when we tried before. We can wait and discuss this at her next ov.

## 2012-03-20 NOTE — Telephone Encounter (Signed)
I spoke with pt. She wanted to know where her CPAP was d/c'd. I advised her it was 11/24/2010. She stated she is not doing well on the oxygen. C/o  every morning she is having runny nose, Dry cough "that doesn't sound right", throat making a "funny sound". Pt states her voice "doesn't sound right anymore". She stated when she was on CPAP she did not have this problem. Pt states she is wanting to if Dr. Maple Hudson would consider her going back on CPAP. Please advise Dr. Maple Hudson thanks

## 2012-03-21 ENCOUNTER — Other Ambulatory Visit: Payer: Self-pay | Admitting: Cardiology

## 2012-03-21 ENCOUNTER — Telehealth: Payer: Self-pay | Admitting: Cardiology

## 2012-03-21 NOTE — Telephone Encounter (Signed)
[facility] PT line busy x 3 wcb 

## 2012-03-21 NOTE — Telephone Encounter (Signed)
New Problem:    Patient called in wanting to speak with you about her upcoming appointment.  Please call back.

## 2012-03-21 NOTE — Telephone Encounter (Signed)
Spoke with pt, she will be taking the [employer] bus to her appt and wanted to let us know in case she is running late.

## 2012-03-22 ENCOUNTER — Telehealth: Payer: Self-pay | Admitting: Cardiology

## 2012-03-22 MED ORDER — DOXYCYCLINE HYCLATE 100 MG PO TABS: ORAL_TABLET | ORAL | Status: DC

## 2012-03-22 NOTE — Telephone Encounter (Signed)
The pt has scheduled an appt for 04/21/12 to discuss cpap use but would like to know if she can have a refill for the Doxycycline. She c/o nasal drainage, dry cough, hoarseness and sob. She denies any fever, chest tightness or wheezing. She believes one more round may help. CY, pls advise.No Known Allergies

## 2012-03-22 NOTE — Telephone Encounter (Signed)
Called spoke with patient, advised of CY's recs to refill the doxycycline.  Rx sent to verified pharmacy.  Nothing further needed; will sign off.

## 2012-03-22 NOTE — Telephone Encounter (Addendum)
Pt calling re appt tomorrow,  she said she couldn't tell me what it's all about Pt called back to let me know she may be late to her appt tomorow

## 2012-03-22 NOTE — Telephone Encounter (Signed)
Patient called to let Dr. [name] and Deliah Goody RN know, that she may be late for her appointment tomorrow, because the bus may peak her up at 15 minutes before of 15 minutes after 11:00 AM pt's appointment is at 12:00 noon.

## 2012-03-22 NOTE — Telephone Encounter (Signed)
Ok to refill doxycycline 

## 2012-03-23 ENCOUNTER — Ambulatory Visit (INDEPENDENT_AMBULATORY_CARE_PROVIDER_SITE_OTHER): Payer: Medicare Other | Admitting: Cardiology

## 2012-03-23 ENCOUNTER — Encounter: Payer: Self-pay | Admitting: Cardiology

## 2012-03-23 VITALS — BP 138/80 | HR 100

## 2012-03-23 DIAGNOSIS — I2510 Atherosclerotic heart disease of native coronary artery without angina pectoris: Secondary | ICD-10-CM

## 2012-03-23 DIAGNOSIS — I10 Essential (primary) hypertension: Secondary | ICD-10-CM

## 2012-03-23 DIAGNOSIS — E785 Hyperlipidemia, unspecified: Secondary | ICD-10-CM

## 2012-03-23 LAB — HEPATIC FUNCTION PANEL
Albumin: 3.6 g/dL (ref 3.5–5.2)
Alkaline Phosphatase: 227 U/L — ABNORMAL HIGH (ref 39–117)
Bilirubin, Direct: 0.1 mg/dL (ref 0.0–0.3)
Total Protein: 7.1 g/dL (ref 6.0–8.3)

## 2012-03-23 LAB — LIPID PANEL
HDL: 38.4 mg/dL — ABNORMAL LOW (ref >39.00)
Total CHOL/HDL Ratio: 3
Triglycerides: 116 mg/dL (ref 0.0–149.0)
VLDL: 23.2 mg/dL (ref 0.0–40.0)

## 2012-03-23 LAB — BASIC METABOLIC PANEL
BUN: 17 mg/dL (ref 6–23)
CO2: 25 mEq/L (ref 19–32)
Chloride: 103 mEq/L (ref 96–112)
Creatinine, Ser: 0.8 mg/dL (ref 0.4–1.2)
Potassium: 4.3 mEq/L (ref 3.5–5.1)

## 2012-03-23 NOTE — Assessment & Plan Note (Signed)
Continue aspirin and statin. We'll most likely plan Myoview when she returns in one year. Long history of atypical chest pain but symptoms have not been recurrent recently.

## 2012-03-23 NOTE — Assessment & Plan Note (Signed)
Continue statin. Check lipids and liver. 

## 2012-03-23 NOTE — Assessment & Plan Note (Signed)
Blood pressure controlled. Continue present medications. Check potassium and renal function. 

## 2012-03-23 NOTE — Patient Instructions (Addendum)
Your physician wants you to follow-up in: ONE YEAR WITH DR CRENSHAW You will receive a reminder letter in the mail two months in advance. If you don't receive a letter, please call our office to schedule the follow-up appointment.   Your physician recommends that you HAVE LAB WORK TODAY 

## 2012-03-23 NOTE — Progress Notes (Signed)
HPI: Pleasant female for followup of coronary artery disease. Patient is status post PCI of her LAD in May of 2011. She had a drug-eluting stent at that time. There was no significant disease in her circumflex or right coronary artery. Her LV function was normal. She has had problems with recurrent atypical chest pain. A Myoview was performed in November of 2011 and showed an EF of 72% and normal perfusion. Repeat cardiac catheterization in May 2012 showed normal LV function with a patent stent in the LAD. No obstructive disease. Chest CT in May of 2012 showed no pulmonary embolus. I last saw patient in Jan 2013. Since then, the patient denies any dyspnea, orthopnea, PND, palpitations, syncope or chest pain. Occasional mild pedal edema.    Current Outpatient Prescriptions  Medication Sig Dispense Refill  . amitriptyline (ELAVIL) 50 MG tablet Take 2 tablets (100 mg total) by mouth at bedtime.  60 tablet  0  . anastrozole (ARIMIDEX) 1 MG tablet Take 1 mg by mouth daily.       Marland Kitchen aspirin 81 MG EC tablet Take 81 mg by mouth daily.        Marland Kitchen atorvastatin (LIPITOR) 40 MG tablet TAKE 1 TABLET BY MOUTH EVERY DAY  30 tablet  8  . bisacodyl (DULCOLAX) 5 MG EC tablet Take 5 mg by mouth daily as needed.      . Calcium Carbonate-Vit D-Min (CALTRATE 600+D PLUS) 600-400 MG-UNIT per tablet Take 1 tablet by mouth daily.        Marland Kitchen doxycycline (VIBRA-TABS) 100 MG tablet 1 daily      . LORazepam (ATIVAN) 2 MG tablet PRN............Marland KitchenMarland KitchenAnxiety and panic attacks      . MAGNESIUM OXIDE PO Take 1 tablet by mouth daily. Pt doesn't know the dose of the medication.      . nitroGLYCERIN (NITROSTAT) 0.4 MG SL tablet Place 1 tablet (0.4 mg total) under the tongue every 5 (five) minutes as needed for chest pain.  25 tablet  2  . Omega-3 Fatty Acids (FISH OIL) 1000 MG CAPS Take 1,000 mg by mouth daily.      . quinapril (ACCUPRIL) 20 MG tablet Take 20 mg by mouth at bedtime.          Past Medical History  Diagnosis Date  .  Sleep apnea     insomnia  . Hyperlipidemia   . Hypertension   . Anxiety   . Chronic headache   . Osteoarthritis   . Panic attacks   . CAD (coronary artery disease)   . Depression   . Chronic chest pain   . Malignant neoplasm of breast     right breast    Past Surgical History  Procedure Date  . Cholecystectomy   . Right mastectomy   . Tonsillectomy   . Knee surgery   . US echocardiography 10/04/2006    EF 55-60%    History   Social History  . Marital Status: Divorced    Spouse Name: N/A    Number of Children: 2  . Years of Education: N/A   Occupational History  . Unemployed    Social History Main Topics  . Smoking status: Never Smoker   . Smokeless tobacco: Not on file  . Alcohol Use: No  . Drug Use: No  . Sexually Active: Not on file   Other Topics Concern  . Not on file   Social History Narrative  . No narrative on file    ROS: no fevers or chills,  productive cough, hemoptysis, dysphasia, odynophagia, melena, hematochezia, dysuria, hematuria, rash, seizure activity, orthopnea, PND, claudication. Remaining systems are negative.  Physical Exam: Well-developed obese in no acute distress.  Skin is warm and dry.  HEENT is normal.  Neck is supple.  Chest is clear to auscultation with normal expansion.  Cardiovascular exam is regular rate and rhythm.  Abdominal exam nontender or distended. No masses palpated. Extremities show trace edema. neuro grossly intact  ECG sinus rhythm at a rate of 100. Left axis deviation. No ST changes.

## 2012-03-24 ENCOUNTER — Other Ambulatory Visit: Payer: Self-pay | Admitting: Cardiology

## 2012-03-24 ENCOUNTER — Encounter: Payer: Self-pay | Admitting: *Deleted

## 2012-03-24 NOTE — Telephone Encounter (Signed)
congratulations

## 2012-03-24 NOTE — Telephone Encounter (Signed)
New Problem    Pt wanted to notify you and Dr. [name] that she is an 54 year and 3 month breast cancer survivor! She said she just wanted to let you know and you dont have to call her back :)

## 2012-03-27 ENCOUNTER — Ambulatory Visit: Payer: Medicare Other | Admitting: Internal Medicine

## 2012-03-30 ENCOUNTER — Ambulatory Visit: Payer: Medicare Other | Admitting: Oncology

## 2012-03-30 ENCOUNTER — Other Ambulatory Visit: Payer: Medicare Other | Admitting: Lab

## 2012-03-30 ENCOUNTER — Telehealth: Payer: Self-pay | Admitting: Oncology

## 2012-03-30 ENCOUNTER — Other Ambulatory Visit: Payer: Self-pay

## 2012-03-30 NOTE — Telephone Encounter (Signed)
s/w pt and advised on 2.19.14 appt...pt ok and aware

## 2012-04-04 ENCOUNTER — Telehealth: Payer: Self-pay | Admitting: Internal Medicine

## 2012-04-04 ENCOUNTER — Other Ambulatory Visit: Payer: Self-pay | Admitting: Medical Oncology

## 2012-04-04 DIAGNOSIS — Z853 Personal history of malignant neoplasm of breast: Secondary | ICD-10-CM

## 2012-04-04 NOTE — Telephone Encounter (Signed)
Papers and message on CY's cart.  

## 2012-04-04 NOTE — Progress Notes (Signed)
Ordered entered 

## 2012-04-04 NOTE — Telephone Encounter (Signed)
Form dropped off for CY to fill out. Approval of drivers perment

## 2012-04-05 ENCOUNTER — Other Ambulatory Visit: Payer: Self-pay | Admitting: Medical Oncology

## 2012-04-05 ENCOUNTER — Ambulatory Visit (HOSPITAL_BASED_OUTPATIENT_CLINIC_OR_DEPARTMENT_OTHER): Payer: Medicare Other | Admitting: Oncology

## 2012-04-05 ENCOUNTER — Telehealth: Payer: Self-pay | Admitting: Oncology

## 2012-04-05 ENCOUNTER — Other Ambulatory Visit: Payer: Medicare Other | Admitting: Lab

## 2012-04-05 ENCOUNTER — Encounter: Payer: Self-pay | Admitting: Oncology

## 2012-04-05 VITALS — BP 128/69 | HR 94 | Temp 98.6°F | Resp 20 | Ht 62.0 in

## 2012-04-05 DIAGNOSIS — C50419 Malignant neoplasm of upper-outer quadrant of unspecified female breast: Secondary | ICD-10-CM

## 2012-04-05 LAB — CBC WITH DIFFERENTIAL/PLATELET
Eosinophils Absolute: 0.4 10*3/uL (ref 0.0–0.5)
HCT: 38.8 % (ref 34.8–46.6)
LYMPH%: 16.6 % (ref 14.0–49.7)
MCV: 81 fL (ref 79.5–101.0)
MONO#: 0.5 10*3/uL (ref 0.1–0.9)
MONO%: 5.5 % (ref 0.0–14.0)
NEUT#: 6.3 10*3/uL (ref 1.5–6.5)
NEUT%: 73.3 % (ref 38.4–76.8)
Platelets: 276 10*3/uL (ref 145–400)
RBC: 4.79 10*6/uL (ref 3.70–5.45)
WBC: 8.7 10*3/uL (ref 3.9–10.3)

## 2012-04-05 LAB — COMPREHENSIVE METABOLIC PANEL (CC13)
ALT: 36 U/L (ref 0–55)
AST: 20 U/L (ref 5–34)
Alkaline Phosphatase: 263 U/L — ABNORMAL HIGH (ref 40–150)
Glucose: 144 mg/dl — ABNORMAL HIGH (ref 70–99)
Potassium: 4.1 mEq/L (ref 3.5–5.1)
Sodium: 141 mEq/L (ref 136–145)
Total Bilirubin: 0.7 mg/dL (ref 0.20–1.20)
Total Protein: 7.2 g/dL (ref 6.4–8.3)

## 2012-04-05 LAB — LACTATE DEHYDROGENASE (CC13): LDH: 214 U/L (ref 125–245)

## 2012-04-05 MED ORDER — ANASTROZOLE 1 MG PO TABS: 1.0000 mg | ORAL_TABLET | Freq: Every day | ORAL | Status: DC

## 2012-04-05 NOTE — Progress Notes (Signed)
This office note has been dictated.  #[id number]

## 2012-04-05 NOTE — Progress Notes (Signed)
CC:   [name], M.D. [name] [name] Som, MD, [facility] [name] D. Maple Hudson, MD, FCCP, FACP  PROBLEM LIST: 1. Invasive lobular carcinoma of the right breast dating back to     November 2002.  Primary tumor was 5.6 cm.  One out of 8 lymph nodes     was positive.  There was no lymphovascular invasion.  Estrogen     receptor and progesterone receptor were both 94%.  HER-2/neu was     negative.  Ki67 was 6%, indicating a low proliferative fraction.     DNA index was diploid.  Tumor stage was pT3 pN1a M0, IIIA.  The     patient underwent a modified radical mastectomy on 01/03/2001.  She     was treated with 6 cycles of 5-fluorouracil, epirubicin, and     Cytoxan.  Chemotherapy treatments were concluded in May 2003 and     the patient has been on Arimidex since that time.  Arimidex has     been continued at the patient's request. 2. Coronary artery disease.  Patient apparently had a stent placed by     Dr. Charlies [name] in 2011. 3. Hypertension. 4. Sleep apnea.  The patient is on nocturnal oxygen 3 L/minute.     Diagnosis goes back to 2009. 5. Generalized arthritis. 6. Irritable bowel syndrome.  MEDICATIONS:  Reviewed and recorded. Current Outpatient Prescriptions  Medication Sig Dispense Refill  . amitriptyline (ELAVIL) 50 MG tablet Take 2 tablets (100 mg total) by mouth at bedtime.  60 tablet  0  . aspirin 81 MG EC tablet Take 81 mg by mouth daily.        Marland Kitchen atorvastatin (LIPITOR) 40 MG tablet TAKE 1 TABLET BY MOUTH EVERY DAY  30 tablet  8  . bisacodyl (DULCOLAX) 5 MG EC tablet Take 5 mg by mouth daily as needed.      . Calcium Carbonate-Vit D-Min (CALTRATE 600+D PLUS) 600-400 MG-UNIT per tablet Take 1 tablet by mouth daily.        Marland Kitchen LORazepam (ATIVAN) 2 MG tablet PRN............Marland KitchenMarland KitchenAnxiety and panic attacks      . MAGNESIUM OXIDE PO Take 1 tablet by mouth daily. Pt doesn't know the dose of the medication.      Marland Kitchen NITROSTAT 0.4 MG SL tablet PLACE 1 TABLET (0.4 MG TOTAL) UNDER THE TONGUE  EVERY 5 (FIVE) MINUTES AS NEEDED FOR CHEST PAIN.  25 tablet  2  . Omega-3 Fatty Acids (FISH OIL) 1000 MG CAPS Take 1,000 mg by mouth daily.      . quinapril (ACCUPRIL) 20 MG tablet Take 20 mg by mouth at bedtime.       Marland Kitchen anastrozole (ARIMIDEX) 1 MG tablet Take 1 tablet (1 mg total) by mouth daily.  30 tablet  6   No current facility-administered medications for this visit.  Arimidex 1 mg daily since May 2003.   SMOKING HISTORY:  Patient has never smoked cigarettes.    HISTORY:  I am seeing [name] today for the first time.  This patient was a long-term patient of Dr. Pierce [name] going back to the fall of 2002 when she was diagnosed with stage IIIA invasive lobular carcinoma of the right breast.  Fortunately, the patient has done well. She denies any symptoms to suggest recurrence of her breast cancer  The patient was last seen by Dr. Donnie [name] on 02/23/2011.  The patient tells me that she lives in an apartment.  She apparently is fairly much wheelchair-bound because of arthritis.  She is taking some physical therapy.  She says that she used to use a walker, but has not used that in some time.  She was able to get to her appointment by [facility].  She apparently drives.  She last drove 3 weeks ago.  She says that she passed her driving test recently.  She has a medical form which she is requesting that I sign; however, I told her that I felt that I could not attest to her general medical health and recommended that she either have her primary physician sign this or perhaps Dr. Jens [name] or Dr. Maple [name] who are more familiar with her general medical condition.  The patient is without any complaints or recent problems.  PHYSICAL EXAMINATION:  General:  She is very pleasant, has an excellent memory.  Weight was not obtained today.  Height 5 feet 2 inches.  Vital Signs:  Blood pressure 128/69.  Other vital signs are normal.  HEENT: Hair is thinning.  There is no scleral icterus.  Mouth and  pharynx are benign.  No peripheral adenopathy palpable.  Heart and lungs:  Normal. Chest:  The patient has had a right-sided mastectomy without evidence of chest wall recurrence.  She has some prominent keloid formation where she apparently had 2 Port-A-Caths placed that did not function and have subsequently been removed.  The left breast is soft, benign, with no suspicious findings.  The patient had a biopsy, curvilinear, at the 12 o'clock position.  This scar has healed well.  Musculoskeletal: No skeletal tenderness to palpation.  Abdomen:  Obese with the patient sitting.  No organomegaly or masses.  Extremities:  There is 1+ edema of the right ankle, trace edema of the left ankle.  No obvious lymphedema of the right arm.  Neurologic:  Nonfocal.  The patient is able to propel her wheelchair by herself.  She is alert, oriented, with sharp memory.  LABORATORY DATA:  Today, white count 8.7, ANC 6.3, hemoglobin 12.8, hematocrit 38.8, platelets 276,000.  Chemistries today notable for a glucose of 144 and an alkaline phosphatase of 263.  IMAGING STUDIES: 1. Bone density scan from 12/29/2009 showed a T-score of the AP lumbar     spine of -0.7.  T-score of the left femoral neck was -0.9.  This is     a normal bone density scan by WHO criteria.  Fracture risk is not     increased. 2. CT angiogram of the chest with IV contrast on 07/13/2010 showed no     evidence for pulmonary embolism or evidence for aortic dissection.     Coronary artery disease was present.  There was some mild bibasilar     atelectasis.  Otherwise, study was normal. 3. CT scan of abdomen and pelvis with IV contrast on 08/09/2011 showed     a tiny umbilical hernia.  There were some scattered normal and     upper-normal size lymph nodes.  There was an increase in the size     of a cystic lesion within the left ovary which contained some     calcification and tiny septation.  This was felt to be     indeterminate and  further evaluation by pelvic and transvaginal     ultrasonography was recommended. 4. Digital screening mammogram carried out on 01/27/2012 showed no     evidence for malignancy.    IMPRESSION AND PLAN:  This patient is now over 11 years from the time of diagnosis.  She wishes to continue Arimidex  for her peace of mind.  We will refill this for her.  She seems to be tolerating this well.  At the present time, I do not believe there is any definitive information about prolonged administration of aromatase inhibitors, although that data does exist for tamoxifen.  Studies looking at the issue of 10 years versus 5 years of aromatase inhibitor are apparently ongoing and some definitive information will eventually be available.  The patient tells me that she sees Dr. Nicholas Lose.  She has apparently had followup evaluation regarding the question of a cystic lesion involving the left ovary.  Apparently, she was reassured that she did not have ovarian cancer  The patient has requested that we check labs on her in 6 months.  We will go ahead and order CBC and chemistries.  I have scheduled Mrs. Thier again for 1 year, at which time we will check CBC and chemistries.  The patient apparently was scheduled for bone density scan, but was not able to have this study because of some technical problems with her getting up onto the table.  Records from this patient's previous visit, also her pathology report from 2002 and imaging studies were reviewed.    ______________________________ Samul Dada, M.D. DSM/MEDQ  D:  04/05/2012  T:  04/05/2012  Job:  [id number]

## 2012-04-05 NOTE — Telephone Encounter (Signed)
printed and gv appt schedule to pt for Aug and Feb 2015

## 2012-04-06 ENCOUNTER — Telehealth: Payer: Self-pay | Admitting: Medical Oncology

## 2012-04-06 NOTE — Telephone Encounter (Signed)
Pt called and asked the results of her tumor marker. I did not see where Dr. Arline Asp had drawn one. He states that he did not order and does not feel pt need to have it being eleven years out from her diagnosis. I left this on her answering machine and asked her to call if any problems.

## 2012-04-07 NOTE — Telephone Encounter (Signed)
Pt aware that form has been filled out and placed in mail. Nothing further needed at this time.

## 2012-04-11 ENCOUNTER — Telehealth: Payer: Self-pay | Admitting: Cardiology

## 2012-04-11 ENCOUNTER — Telehealth: Payer: Self-pay | Admitting: Internal Medicine

## 2012-04-11 NOTE — Telephone Encounter (Signed)
Spoke with patient-states she seen where her paperwork she had CY fill out showed she uses CPAP and was wondering if he would let her try it again. Pt then stated she would wait and speak with CY at her OV next week. Nothing more needed at this time.

## 2012-04-11 NOTE — Telephone Encounter (Signed)
Will watch for paperwork

## 2012-04-11 NOTE — Telephone Encounter (Signed)
New Prob     Pt states something will be coming in the mail for you to fill out. She is back in PT working with a walker. She said she just wanted you to know and not call her back.

## 2012-04-13 ENCOUNTER — Ambulatory Visit (HOSPITAL_COMMUNITY): Payer: Medicare Other

## 2012-04-14 ENCOUNTER — Other Ambulatory Visit: Payer: Self-pay | Admitting: Physician Assistant

## 2012-04-21 ENCOUNTER — Ambulatory Visit: Payer: Medicare Other | Admitting: Internal Medicine

## 2012-04-25 ENCOUNTER — Telehealth: Payer: Self-pay | Admitting: Internal Medicine

## 2012-04-25 NOTE — Telephone Encounter (Signed)
Per CY-we are unable to work patient in any sooner. She can speak with Dr Felipa Eth about getting in with him for acute visit for her symptoms.

## 2012-04-25 NOTE — Telephone Encounter (Signed)
ATC pt x2 - line busy.  WCB. 

## 2012-04-25 NOTE — Telephone Encounter (Signed)
Called, spoke with pt.  She had an OV scheduled with Dr. Maple Hudson on Friday but office was closed d/t weather.  Pt called back to reschedule this.  She c/o having a raw throat, congestion in her throat qhs, runny nose qam, and nonprod cough.  Denies increased SOB, chest tightness, or PND.  I offered OV on Thursday at 9 am with Dr. Maple Hudson.  Pt declined.  States she needs an appt at 1:30 or after.  She is specifically requesting an appt this week to replace the appt she had on Friday.  Dr. Maple Hudson, pls advise if pt can be worked in this week after 1:30.  Thank you.  Last OV with CDY was on 02/04/2012; was asked to f/u in 4 months.  No Known Allergies

## 2012-04-25 NOTE — Telephone Encounter (Signed)
lmtcb x1 for pt. 

## 2012-04-25 NOTE — Telephone Encounter (Signed)
Returning call can be reached at [phone].[name]

## 2012-04-26 NOTE — Telephone Encounter (Signed)
I spoke with the pt and advised but while I was on the phone CY had a cancellation for tomorrow at 2:15. Pt scheduled to see him then. Carron Curie, CMA

## 2012-04-27 ENCOUNTER — Ambulatory Visit (INDEPENDENT_AMBULATORY_CARE_PROVIDER_SITE_OTHER)
Admission: RE | Admit: 2012-04-27 | Discharge: 2012-04-27 | Disposition: A | Discharge status: Home / Self Care | Payer: Medicare Other | Source: Ambulatory Visit | Attending: Internal Medicine | Admitting: Internal Medicine

## 2012-04-27 ENCOUNTER — Encounter: Payer: Self-pay | Admitting: Internal Medicine

## 2012-04-27 ENCOUNTER — Encounter (INDEPENDENT_AMBULATORY_CARE_PROVIDER_SITE_OTHER): Payer: Medicare Other | Admitting: Surgery

## 2012-04-27 ENCOUNTER — Ambulatory Visit (INDEPENDENT_AMBULATORY_CARE_PROVIDER_SITE_OTHER): Payer: Medicare Other | Admitting: Internal Medicine

## 2012-04-27 ENCOUNTER — Other Ambulatory Visit: Payer: Self-pay | Admitting: Internal Medicine

## 2012-04-27 VITALS — BP 128/70 | HR 95 | Ht 62.0 in | Wt 170.0 lb

## 2012-04-27 DIAGNOSIS — J209 Acute bronchitis, unspecified: Secondary | ICD-10-CM

## 2012-04-27 DIAGNOSIS — G4733 Obstructive sleep apnea (adult) (pediatric): Secondary | ICD-10-CM

## 2012-04-27 DIAGNOSIS — G4700 Insomnia, unspecified: Secondary | ICD-10-CM

## 2012-04-27 NOTE — Patient Instructions (Addendum)
Order- [facility]- she asks to change CPAP/ O2 supplier     Restart CPAP auto titrate for pressure recommendation 5-15 cwp, humidifier, mask of choice,                   O2 for sleep 3L/Min       Dx OSA

## 2012-04-27 NOTE — Progress Notes (Signed)
Patient ID: [name], female    DOB: Apr 19, 1935, 77 y.o.   MRN: [id number]  HPI 08/20/10- 33 yoF never smoker followed for OSA, dyspnea, rhinitis, complicated by mental health issues( Dr Toy Care) and by hx right mastectomy/ breast Ca Last here 02/05/10- note reviewed. She continues CPAP. Download showed use 3.5 hrs/ night. She says she pulls it off in her sleep.  The download indicated good control AHI 3.9, at 12 cwp best pressure. She seems motivated to continue. She denies waking much during the night.  Since last here she went hospital for cath due to recurrent chest pain- told no coronary blockage.  She no longer has home oxygen. Overnight oximetry on CPAP/ room air 07/21/10- 2 minutes, 20 seconds with sat < 88%. This is good enough to leave O2 off as she wishes.   02/12/11-  75 yoF never smoker followed for OSA, dyspnea, rhinitis, complicated by mental health issues( Dr Toy Care) and by hx right mastectomy/ breast Ca Failed CPAP- kept pulling it off. She does wear oxygen all night at 2 L as intended and sleeps better with this. Watery rhinorrhea in the mornings. She declines flu vaccine. Has avoided recent respiratory infections. She has been following politics closely and was quite sharp about these issues.  09/22/11- 76 yoF never smoker followed for OSA, dyspnea, rhinitis, complicated by mental health issues( Dr Toy Care) and by hx right mastectomy/ breast Ca Acute visit-sore throat x7 days. Raw throat-hurts to swallow-was given Amoxicillin and has helped; runny nose during the day, cough at night. We had called in amoxicillin earlier and she says that helped. OSA- she discussed her CPAP intolerance with Dr.Kaur/ Psychiatry, who blamed anxiety for taking the mask off. She sleeps comfortably using oxygen. She referred to "female" who comes in and out of her house, adjusts her oxygen and other things but apparently has never been seen by anybody. She can just tell he's been there because she sees things have  been moved.  02/04/12- 76 yoF never smoker followed for OSA, dyspnea, rhinitis, complicated by mental health issues( Dr Toy Care) and by hx right mastectomy/ breast Ca FOLLOWS WHQ:PRFFMBW options of going back on CPAP vs just O2(states that she woke up again and "tutti frutti"( invisible man who moves her furniture and takes her mask off at night) took mask off-she was able to put back on and continue sleeping) She declines flu vaccine. She had asked to sleep with a simple mask and oxygen- through [facility], this required 5 L. She has been taping on her mask at night but still finds it pulled off. Security officers have checked her house and under her bed but don't see him because "he curls up under the blanket". CXR 1/27/ 13 IMPRESSION:  No evidence of active pulmonary disease. Cardiac enlargement.  Original Report Authenticated By: Neale Burly, M.D.  04/27/12- 6 yoF never smoker followed for OSA, dyspnea, rhinitis, complicated by mental health issues( Dr Toy Care) and by hx right mastectomy/ breast Ca ACUTE VISIT: coughing thorugh the night that sounds raspy-wheezing, States runny nose and cough is coming from O2 at night. Also, would like to speak with CY about maybe using CPAP again. Has not pulled O2 tubing out of nose at all. She blames "stress" as the reason why she would pull her CPAP of repeatedly at night. She says that stress is now much better and she would like to try CPAP again. Noticing raspy cough especially at night over the last 2 months. Denies pain  in throat for chest. Little sputum. No fever.  Review of Systems-see HPI Constitutional:   No-   weight loss, night sweats, fevers, chills, fatigue, lassitude. HEENT:   No-  headaches, difficulty swallowing, tooth/dental problems, +sore throat,       No-  sneezing, itching, ear ache, nasal congestion,  +post nasal drip,  CV:  No-   chest pain, orthopnea, PND, swelling in lower extremities, anasarca, dizziness, palpitations Resp: No-  acute  shortness of breath with exertion or at rest, wearing oxygen.            No-   productive cough,  + non-productive cough,  No- coughing up of blood.              No-   change in color of mucus.  No- wheezing.   Skin: No-   rash or lesions. GI:  No-   heartburn, indigestion, abdominal pain, nausea, vomiting,  GU:  MS:  No-   joint pain or swelling.   Neuro-     nothing unusual Psych:  No- change in mood or affect. No acute depression or anxiety.  No memory loss.  Objective:  Physical Exam BP 128/70  Pulse 95  Ht 5\' 2"  (1.575 m)  Wt 170 lb (77.111 kg)  BMI 31.09 kg/m2  SpO2 97% General- Alert, Oriented, Affect-appropriate, Distress- none acute, bright and alert, wheelchair, wearing her same winter hat. Obese Skin- rash-none, lesions- none, excoriation- none Lymphadenopathy- none Head- atraumatic            Eyes- Gross vision intact, PERRLA, conjunctivae clear secretions            Ears- Hearing, canals-normal            Nose- Clear, no-Septal dev, mucus, polyps, erosion, perforation             Throat- Mallampati II , mucosa clear , drainage- none, tonsils- atrophic Neck- flexible , trachea midline, no stridor , thyroid nl, carotid no bruit Chest - symmetrical excursion , unlabored           Heart/CV- RRR , no murmur , no gallop  , no rub, nl s1 s2                           - JVD- none , edema- none, stasis changes- none, varices- none           Lung- clear to P&A, wheeze- none, cough- none , dullness-none, rub- none           Chest wall- right mastectomy Abd-  Br/ Gen/ Rectal- Not done, not indicated Extrem- cyanosis- none, clubbing, none, atrophy- none, strength- nl Neuro- grossly intact to observation

## 2012-04-29 ENCOUNTER — Telehealth: Payer: Self-pay | Admitting: Pulmonary Disease

## 2012-04-29 NOTE — Telephone Encounter (Signed)
Complaining of cough, dry non productive, without fever or chills, predominantly at night, per patient associated with oxygen use.   I will call to the pharmacy Tessalon pearls 100 mg po tid prn.   I also noticed that she is on ACEI, would consider changing to an alternative BP meds.

## 2012-04-30 NOTE — Assessment & Plan Note (Signed)
Plan-chest x-ray 

## 2012-04-30 NOTE — Assessment & Plan Note (Signed)
She did qualify for CPAP for but kept pulling it off and failed to meet compliance standards so with machine was taken back. I'm willing to let her try again. She asked to change DME companies. Plan-CPAP autotitration with oxygen at 3 L

## 2012-05-01 NOTE — Telephone Encounter (Signed)
Discussed w/ patient before. Agree if complaint persists at next ov, will want to change off ACE I.

## 2012-05-02 ENCOUNTER — Telehealth: Payer: Self-pay | Admitting: Internal Medicine

## 2012-05-02 NOTE — Telephone Encounter (Signed)
I spoke with pt and she wanted to know if she is suppose to use oxygen with her CPAP. I advised her according to last OV note this is correct. Nothing further was needed

## 2012-05-03 ENCOUNTER — Telehealth: Payer: Self-pay | Admitting: Internal Medicine

## 2012-05-03 NOTE — Telephone Encounter (Signed)
ATC pt line busy x 4 wcb 

## 2012-05-04 NOTE — Telephone Encounter (Signed)
Will forward to CY on forms for [facility] ---patient does not have PCP. Currently trying to establish one.  Four questions on form that need to be filled out.  [employer] sent form to patient stating they are going to take her license if not filled out by physician stating that she is capable of driving. Patient was very upset on phone because she has no family to help her and if she loses her license then she will have no means of getting around.   Please advise Dr Maple Hudson if this is omething that you would consider filling out for patient. Thanks.

## 2012-05-04 NOTE — Telephone Encounter (Signed)
LMTCB

## 2012-05-04 NOTE — Telephone Encounter (Signed)
Pt returned call. [name]  

## 2012-05-04 NOTE — Telephone Encounter (Signed)
Spoke with pt and notified of recs per CDY She states that a new PCP would not fill out the forms and she hung up on me I will forward to CDY so that he is aware of this

## 2012-05-04 NOTE — Telephone Encounter (Signed)
Per CY-will not fill out forms and we need to get PCC's to help with finding her a PCP that can assist with forms.

## 2012-05-05 NOTE — Telephone Encounter (Signed)
Noted  

## 2012-05-08 ENCOUNTER — Telehealth: Payer: Self-pay | Admitting: Internal Medicine

## 2012-05-08 NOTE — Telephone Encounter (Signed)
ATC Kim-- currently busy [facility]

## 2012-05-08 NOTE — Telephone Encounter (Signed)
ATC patient x 3. [employer]. WCB

## 2012-05-09 ENCOUNTER — Encounter (INDEPENDENT_AMBULATORY_CARE_PROVIDER_SITE_OTHER): Payer: Medicare Other | Admitting: Surgery

## 2012-05-09 ENCOUNTER — Telehealth: Payer: Self-pay | Admitting: Internal Medicine

## 2012-05-09 NOTE — Telephone Encounter (Signed)
ATC pt line still busy x 3 wcb

## 2012-05-09 NOTE — Telephone Encounter (Signed)
Will close this message as there is another message open on same pt.

## 2012-05-09 NOTE — Telephone Encounter (Signed)
I spoke with pt and she stated she stated she will use the CPAP if CDY feels like she needs the CPAP. She wants to know how much advantage this is going to be to her. She stated someone is picking her locks and turning her O2 to 5L from 3L. She states he has not done this since the other night. Please advise Dr. Maple Hudson thanks

## 2012-05-09 NOTE — Telephone Encounter (Signed)
lmtcb x2 for [name]

## 2012-05-10 ENCOUNTER — Telehealth: Payer: Self-pay | Admitting: Internal Medicine

## 2012-05-10 ENCOUNTER — Ambulatory Visit: Payer: Medicare Other | Admitting: Gynecology

## 2012-05-10 NOTE — Telephone Encounter (Signed)
D/C order for CPAP

## 2012-05-10 NOTE — Telephone Encounter (Signed)
LMTCB

## 2012-05-10 NOTE — Telephone Encounter (Signed)
Called, spoke with [name].  Calling to let CDY know: 1.  Pt refused CPAP set up.  Reports pt declined because she has tried it before and it didn't work.  [name] states pt will still think about having this. 2.  [name] states she did go out to pt's home on Monday to show her how to use pulse ox for ONO but pt is having problems with this.  [name] states she will go back to pt's home today to re-instruct her on how to use it.  Calling as FYI for Dr. Maple Hudson.

## 2012-05-11 ENCOUNTER — Telehealth: Payer: Self-pay | Admitting: Internal Medicine

## 2012-05-11 ENCOUNTER — Ambulatory Visit (HOSPITAL_COMMUNITY): Admission: RE | Admit: 2012-05-11 | Payer: Medicare Other | Source: Ambulatory Visit

## 2012-05-11 DIAGNOSIS — R0602 Shortness of breath: Secondary | ICD-10-CM

## 2012-05-11 NOTE — Telephone Encounter (Signed)
Per CY-order DME [facility] add humidifier to her O2.

## 2012-05-11 NOTE — Telephone Encounter (Signed)
Order has been sent and pt is aware

## 2012-05-11 NOTE — Telephone Encounter (Signed)
Per CDY we are going to d/c the CPAP. I called and made pt aware of this.

## 2012-05-11 NOTE — Telephone Encounter (Signed)
I spoke with [name] and is aware will d/c CPAP. Nothing further was needed

## 2012-05-11 NOTE — Telephone Encounter (Signed)
Pt CPAP will be d/c. I called and made pt aware of this. Nothing further was needed

## 2012-05-11 NOTE — Telephone Encounter (Signed)
I spoke with pt. She is requesting a humidifier order to be sent to her DME. She c/o runny nose when she uses the oxygen. She called her DME but was advised they need an order from [place]. Please advise Dr. Maple Hudson thanks

## 2012-05-11 NOTE — Telephone Encounter (Signed)
Kim wanted to let me know that the patient had called her several times to make sure we got the order for humidifier to add to her O2 was sent to [facility] not [facility]. I have spoke with Bjorn Loser and she is taking care of this. Nothing more needed at this time.

## 2012-05-11 NOTE — Telephone Encounter (Signed)
Pt called again wants to speak to nurse about her O2 supplies.[name]

## 2012-05-12 ENCOUNTER — Telehealth: Payer: Self-pay | Admitting: Internal Medicine

## 2012-05-12 DIAGNOSIS — G4733 Obstructive sleep apnea (adult) (pediatric): Secondary | ICD-10-CM

## 2012-05-12 NOTE — Telephone Encounter (Signed)
Please advise on ONO results. Results attached. Carron Curie, CMA

## 2012-05-12 NOTE — Telephone Encounter (Signed)
Pt advised of results and order sent. Carron Curie, CMA

## 2012-05-12 NOTE — Telephone Encounter (Signed)
ONOX- good oxygen level on O2  Does not need CPAP- order [facility] d/c CPAP                                      Order humidifier for her O2

## 2012-05-13 ENCOUNTER — Other Ambulatory Visit: Payer: Self-pay | Admitting: Physician Assistant

## 2012-05-17 ENCOUNTER — Ambulatory Visit: Payer: Medicare Other

## 2012-05-17 ENCOUNTER — Ambulatory Visit: Payer: Medicare Other | Admitting: Gynecology

## 2012-05-23 ENCOUNTER — Telehealth: Payer: Self-pay | Admitting: Internal Medicine

## 2012-05-23 NOTE — Telephone Encounter (Signed)
Spoke to pt she had a problem getting a new humidifier bottle but now has it [name]

## 2012-05-24 ENCOUNTER — Ambulatory Visit: Payer: Self-pay | Admitting: Gynecology

## 2012-05-25 ENCOUNTER — Telehealth: Payer: Self-pay | Admitting: Cardiology

## 2012-05-25 NOTE — Telephone Encounter (Signed)
Spoke with pt, she has some DOT paperwork she is going to mail to me to look at to see if we can fill out.

## 2012-05-25 NOTE — Telephone Encounter (Signed)
New problem   Pt want need to talk to you concerning a medical form.Please call

## 2012-05-29 ENCOUNTER — Ambulatory Visit: Payer: Medicare Other | Admitting: Internal Medicine

## 2012-05-29 ENCOUNTER — Encounter: Payer: Self-pay | Admitting: Internal Medicine

## 2012-05-30 ENCOUNTER — Telehealth: Payer: Self-pay

## 2012-05-30 NOTE — Telephone Encounter (Signed)
Pt called asking about eating oranges, grapefruits (acidic fruits). Discussed fresh fruits and vegetables and good foods and white breads and pastas as poorer choices.

## 2012-05-31 ENCOUNTER — Ambulatory Visit (HOSPITAL_COMMUNITY): Payer: Medicare Other

## 2012-05-31 ENCOUNTER — Ambulatory Visit: Payer: Self-pay | Admitting: Gynecology

## 2012-06-08 ENCOUNTER — Telehealth: Payer: Self-pay | Admitting: Cardiology

## 2012-06-08 ENCOUNTER — Ambulatory Visit: Payer: Medicare Other | Admitting: Internal Medicine

## 2012-06-08 NOTE — Telephone Encounter (Signed)
Information mailed to pt regarding diet that can help with constipation. Left message for pt.

## 2012-06-08 NOTE — Telephone Encounter (Signed)
Spoke with pt, she is having constipation and wonders what to take.

## 2012-06-08 NOTE — Telephone Encounter (Signed)
New problem    About medications- is all the patient would say(says she will explain to you)

## 2012-06-09 ENCOUNTER — Telehealth: Payer: Self-pay | Admitting: Cardiology

## 2012-06-09 NOTE — Telephone Encounter (Signed)
Spoke with pt, questions answered.

## 2012-06-09 NOTE — Telephone Encounter (Signed)
New Prob     Pt would like to speak to nurse regarding a medical form. Please call.

## 2012-06-12 ENCOUNTER — Telehealth: Payer: Self-pay

## 2012-06-12 ENCOUNTER — Telehealth: Payer: Self-pay | Admitting: Cardiology

## 2012-06-12 NOTE — Telephone Encounter (Signed)
Spoke with pt, aware paperwork mailed from [place] late Wednesday night. Hopefully will get in the mail today. She will call if she never receives.

## 2012-06-12 NOTE — Telephone Encounter (Signed)
New Problem:    Patient called in very confused and wished to speak with you.  When asked what the call was regarding patient declined to disclose stating "Not to worry about it. Stanton Kidney would take care of it for me."  Please call back.

## 2012-06-12 NOTE — Telephone Encounter (Signed)
New Prob     Pt states form has still not arrived and wants to know if a copy was made. Would like to speak to nurse.

## 2012-06-12 NOTE — Telephone Encounter (Signed)
Pt is wanting Dr. Perrin Maltese to call her back to discuss something Call back number is [phone]

## 2012-06-13 ENCOUNTER — Telehealth: Payer: Self-pay | Admitting: Radiology

## 2012-06-13 NOTE — Telephone Encounter (Signed)
Patient requesting refill on her Ativan. Please advise.

## 2012-06-13 NOTE — Telephone Encounter (Signed)
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. Patient states she is disabled in wheelchair. She has seen cardiology, pulmonology, and eye doctor. Advised her the forms are $150. She states she will go elsewhere, she does not want to pay this.

## 2012-06-14 ENCOUNTER — Telehealth: Payer: Self-pay | Admitting: Cardiology

## 2012-06-14 NOTE — Telephone Encounter (Signed)
New problem    Call pt regarding medical form

## 2012-06-14 NOTE — Telephone Encounter (Signed)
Follow up   Pt calling back cause she need to talk to you today per pt.

## 2012-06-14 NOTE — Telephone Encounter (Signed)
Spoke with pt, she will bring the form by the office Friday so I can fill out the parts I missed.

## 2012-06-14 NOTE — Telephone Encounter (Signed)
Spoke with pt, questions answered regarding form.

## 2012-06-15 NOTE — Telephone Encounter (Signed)
Denial sent. Patient advised.

## 2012-06-15 NOTE — Telephone Encounter (Signed)
rtc 

## 2012-06-16 ENCOUNTER — Other Ambulatory Visit: Payer: Self-pay | Admitting: Physician Assistant

## 2012-06-16 ENCOUNTER — Telehealth: Payer: Self-pay | Admitting: Cardiology

## 2012-06-16 NOTE — Telephone Encounter (Signed)
Spoke with pt, questions answered.

## 2012-06-16 NOTE — Telephone Encounter (Signed)
New problem    Pt calling w/questions about a medical form

## 2012-06-26 ENCOUNTER — Telehealth: Payer: Self-pay | Admitting: Cardiology

## 2012-06-26 ENCOUNTER — Telehealth: Payer: Self-pay

## 2012-06-26 NOTE — Telephone Encounter (Signed)
Spoke with pt, questions regarding exercise answered.

## 2012-06-26 NOTE — Telephone Encounter (Signed)
New problem     Questions    1. Exercise for the heart.

## 2012-06-26 NOTE — Telephone Encounter (Signed)
Pt sees Games developer. States she saw her primary but he wont rx lorazapam and asks if Maralyn Sago will rx a 1 month supply until she can find another primary that does.   Pt best: [phone]  bf

## 2012-06-26 NOTE — Telephone Encounter (Signed)
I am sorry I do not think that I have seen the patient (at least since we have gone live with Epic).  She has seen Dr Perrin Maltese once but we have never Rx her this medication.  It looks like she normally gets this from Dr Evelene Croon.

## 2012-06-27 NOTE — Telephone Encounter (Signed)
Called her to advise.  

## 2012-06-28 ENCOUNTER — Telehealth: Payer: Self-pay | Admitting: Cardiology

## 2012-06-28 ENCOUNTER — Telehealth: Payer: Self-pay | Admitting: Critical Care Medicine

## 2012-06-28 NOTE — Telephone Encounter (Signed)
Pt uses oxygen concentrator at night, but water does not bubble at all. No air coming through nose tubing.  Pt talked to [facility] will  not come out at night.    I have placed a call to [facility] and told them to go out TONITE to fix this issue.    They said they will do so [facility])  TRIAGE pls call pt in am to insure she was cared for properly by [facility]

## 2012-06-28 NOTE — Telephone Encounter (Signed)
New Prob      Pt calling in regarding CT scan. Please call.

## 2012-06-28 NOTE — Telephone Encounter (Signed)
Spoke with pt, questions answered.

## 2012-06-29 NOTE — Telephone Encounter (Signed)
Noted and thanks.

## 2012-06-29 NOTE — Telephone Encounter (Signed)
Called spoke with patient She verified that [facility] indeed came to her home last night at approximately 9pm and fixed her O2 Pt stated that she did not see until this morning that [facility] had called her again last night to check on her Asked pt if she was feeling well this morning and needed to come in for an ov but pt declined this, stating she simply did not sleep well Advised pt to please call the office should she decide she needs to be seen Pt verbalized her understanding Nothing further needed at this time Will sign and forward to both CY and PW as FYI

## 2012-07-06 ENCOUNTER — Telehealth: Payer: Self-pay | Admitting: Cardiology

## 2012-07-06 ENCOUNTER — Telehealth: Payer: Self-pay | Admitting: Internal Medicine

## 2012-07-06 NOTE — Telephone Encounter (Signed)
[email or web address].Jennifer Castillo, CMA  

## 2012-07-06 NOTE — Telephone Encounter (Signed)
Spoke with pt, questions regarding DOT paperwork answered.

## 2012-07-06 NOTE — Telephone Encounter (Signed)
New Prob      Pt would like to speak to nurse regarding some medical forms.

## 2012-07-07 NOTE — Telephone Encounter (Signed)
ATC pt x2 - line busy.  WCB. 

## 2012-07-07 NOTE — Telephone Encounter (Signed)
Yes- she has "respiratory disorder"- sleep apnea and also obesity-hypoventilation syndrome. That's why she uses oxygen at night. I should be able to answer those questions on her form. I don't think they would prevent her from local driving as long as she is alert and careful.

## 2012-07-07 NOTE — Telephone Encounter (Signed)
Called spoke with patient who reported that the [facility] paperwork that CY filled out for her has been sent back (she has already passed the driving portion of the test) She stated that there is 1 question that is needed > does she have a Respiratory Disorder (yes or no question) Pt does report that this question is part of the PCP section and is aware that CY has already stated he will not be able to answer any PCP questions on the form but stated that she has seen her PCP once and is unable to contact them There are other similar questions to the one above, ie Cardiac Disorder (pt stated this is "No" tho she is established with Dr [name]) Denies SOB "lying down, getting up, moving around" and is "faithfully using O2 at night" Tried to discuss with patient the need for an ov or at least to bring the forms for CY to view but pt declined ov and is unable to bring the forms until next week Pt asked if CY could just answer this question for her Dr Maple Hudson please advise if you are able to answer this question, thank you.

## 2012-07-11 ENCOUNTER — Telehealth: Payer: Self-pay | Admitting: Internal Medicine

## 2012-07-11 ENCOUNTER — Ambulatory Visit (INDEPENDENT_AMBULATORY_CARE_PROVIDER_SITE_OTHER): Payer: Medicare Other | Admitting: Surgery

## 2012-07-11 NOTE — Telephone Encounter (Signed)
Spoke with patient-aware that CY should be the one to fill out the form(s) and sign them. Pt aware of this and will have someone drop them off here tomorrow or place them in the mail to CY's attention. Nothing more needed at this time.

## 2012-07-11 NOTE — Telephone Encounter (Signed)
Pt advised. Jennifer Castillo, CMA  

## 2012-07-11 NOTE — Telephone Encounter (Signed)
Copied from previous message on 07-06-12: [name] Budge, MD at 07/07/2012 1:35 PM   Status: Signed            Yes- she has "respiratory disorder"- sleep apnea and also obesity-hypoventilation syndrome. That's why she uses oxygen at night. I should be able to answer those questions on her form. I don't think they would prevent her from local driving as long as she is alert and careful.    Pt was advised of the above. She states that the paperwork requires a paragraph explanation for any question that is marked yes so since she has to mark yes for respiratory disease she is asking for a paragraph explaining the answer, such as what her disease is etc. Please advise if ok to write the letter. Pt requests this be mailed to her once completed. Carron Curie, CMA

## 2012-07-14 ENCOUNTER — Telehealth: Payer: Self-pay | Admitting: Internal Medicine

## 2012-07-14 NOTE — Telephone Encounter (Signed)
Papers and message on CY's cart to fill out as he requested.

## 2012-07-17 ENCOUNTER — Telehealth: Payer: Self-pay | Admitting: Cardiology

## 2012-07-17 NOTE — Telephone Encounter (Signed)
New Problem:    Patient called in wanting to know if you received the envelope she sent.  Please call back.

## 2012-07-17 NOTE — Telephone Encounter (Signed)
Left message for pt, paperwork at the front desk ready for pick up.

## 2012-07-17 NOTE — Telephone Encounter (Signed)
Spoke with pt, aware paperwork is complete and awaiting sig from dr [name]. Will call when complete so she can pick it up.

## 2012-07-17 NOTE — Telephone Encounter (Signed)
Pt called back requesting to speak to Katie re: papers / status. Hazel Sams

## 2012-07-18 NOTE — Telephone Encounter (Signed)
Pt called back requesting to speak to [facility] about papers she dropped off.  She can be reached @ [phone]. Leanora Ivanoff

## 2012-07-18 NOTE — Telephone Encounter (Signed)
Spoke with patient-states that she wanted to let us know that she only needed a paragraph stating that it is or is not medically safe for patient to operate a car. Patient is aware that CY has the forms and will fill out accordingly-that he will NOT ever say it is okay for anyone to drive as this puts him responsible for that patient. The decision is solely from [facility] if they fill patient can/should drive based on information provided about her health from the dr she sees. Pt verbalized her understanding of this.

## 2012-07-19 ENCOUNTER — Ambulatory Visit (INDEPENDENT_AMBULATORY_CARE_PROVIDER_SITE_OTHER): Payer: Medicare Other | Admitting: Surgery

## 2012-07-19 ENCOUNTER — Other Ambulatory Visit: Payer: Self-pay | Admitting: Cardiology

## 2012-07-19 ENCOUNTER — Encounter (INDEPENDENT_AMBULATORY_CARE_PROVIDER_SITE_OTHER): Payer: Self-pay | Admitting: Surgery

## 2012-07-19 VITALS — BP 130/70 | HR 72 | Temp 98.4°F | Resp 17 | Ht 64.0 in | Wt 180.0 lb

## 2012-07-19 DIAGNOSIS — Z853 Personal history of malignant neoplasm of breast: Secondary | ICD-10-CM

## 2012-07-19 NOTE — Progress Notes (Signed)
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 She has no problems or concerns on either side.  PFSH: She has had no significant changes since the last visit here.  ROS: There have been no significant changes since the last visit here. She has been using o@ at night and is also followed by Dr Dellia Cloud: General: The patient is alert, oriented, generally healthy appearing, NAD. Mood and affect are normal.  Breasts:  Right surgically absent, no evidence of local recurrence. Left is normal  Lymphatics: She has no axillary or supraclavicular adenopathy on either side.  Extremities: Full ROM of the surgical side with no lymphedema noted.  Data Reviewed: Mammogram in Dec, 2013 MPRESSION:  No mammographic evidence of malignancy.  A result letter of this screening mammogram will be mailed directly  to the patient.  RECOMMENDATION:  Screening mammogram in one year. (Code:SM-B-01Y)  BI-RADS CATEGORY 2: Benign finding(s).  Original Report Authenticated By: Sherian Rein, M.D.   Impression: Doing well, with no evidence of recurrent cancer or new cancer  Plan: She will continue annual mamograms

## 2012-07-19 NOTE — Patient Instructions (Signed)
Continue annual mammograms  

## 2012-07-20 ENCOUNTER — Telehealth: Payer: Self-pay

## 2012-07-20 NOTE — Telephone Encounter (Signed)
Pt stated she heard from Dr Jamey Ripa that Dr Arline Asp is retiring. She stated she regrets hearing that because he is an Engineer, mining. She asked about future appts. I instructed her to keep appts and the interim doctor will continue seeing her.

## 2012-07-21 ENCOUNTER — Telehealth: Payer: Self-pay | Admitting: Internal Medicine

## 2012-07-21 NOTE — Telephone Encounter (Signed)
Per phone note 07/18/12 Katie documented Dr. Maple Hudson had the paperwork. I called and made pt aware. She voiced her understanding. She would like a call once this is completed. Please advise Dr. Maple Hudson thanks

## 2012-07-25 NOTE — Telephone Encounter (Signed)
Per pt Dr. Maple Hudson has already filled out that page and was sent to [place]. Per pt they are wanting to know if Dr. Maple Hudson thinks she is adequately physically able drive the care. Per pt this all the way at the bottom of the page. Pt states Dr. Maple Hudson does not have to fill out the whole form just that one part. Please advise Dr. Maple Hudson thanks

## 2012-07-25 NOTE — Telephone Encounter (Signed)
The paper she gave me was only part of the form. It asks me to complete the respiratory section on page 7, which she didn't give me.

## 2012-07-27 NOTE — Telephone Encounter (Signed)
Katie, do you know if this has been taken care of? Thanks!

## 2012-07-28 NOTE — Telephone Encounter (Signed)
Papers completed

## 2012-07-28 NOTE — Telephone Encounter (Signed)
This is a duplicate msg from the one taken 07/14/12 Will close this msg at this time

## 2012-07-31 ENCOUNTER — Telehealth: Payer: Self-pay | Admitting: Internal Medicine

## 2012-07-31 MED ORDER — AMOXICILLIN 500 MG PO CAPS: 500.0000 mg | ORAL_CAPSULE | Freq: Three times a day (TID) | ORAL | Status: DC

## 2012-07-31 NOTE — Telephone Encounter (Signed)
lmomtcb x1 

## 2012-07-31 NOTE — Telephone Encounter (Signed)
Pt aware that paper work is complete and will come by today to pick up-at front. Copy made for use to scan in EPIC.

## 2012-07-31 NOTE — Telephone Encounter (Signed)
Katie have you received these papers back from CY? If so I can contact patient to have her come pick these up.  Please advise, thanks.

## 2012-07-31 NOTE — Telephone Encounter (Signed)
Pt aware of recs. rx called in. Nothing further was needed 

## 2012-07-31 NOTE — Telephone Encounter (Signed)
Offer amoxacillin 500 mg, # 21, 1 three times daily. See if that helps.

## 2012-07-31 NOTE — Telephone Encounter (Signed)
Pt c/o increased PND, facial pain in sinuses(cheek bone) and dull pain in top of head x 3-4 days. Pt feels that she may have a small cold. Pt denies change in color of mucus.  Patient states that she has been having some dental issues and she thinks that this pain could be related to that--not sure.  She is not able to come in for appt d/t cost and would like recommendations of something to take OTC in case it is something related to sinuses. I explained to the pt that the only way to make a determination as to if it is in fact her sinuses or her tooth she would need to be evaluated--pt states that she is very concerned with cancer as well.  Would like recs per CY on what she needs to do.  [facility] [address] No Known Allergies  Please advise Dr Maple Hudson. Thanks.

## 2012-08-01 ENCOUNTER — Telehealth: Payer: Self-pay | Admitting: Cardiology

## 2012-08-01 NOTE — Telephone Encounter (Signed)
New Problem  Pt wants to speak with you regarding her diet.

## 2012-08-01 NOTE — Telephone Encounter (Signed)
Spoke with pt, questions regarding eggs answered. Copy of heart healthy diet mailed to pt.

## 2012-08-07 ENCOUNTER — Telehealth: Payer: Self-pay | Admitting: Cardiology

## 2012-08-07 NOTE — Telephone Encounter (Signed)
Spoke with pt, questions regarding CAD answered.

## 2012-08-07 NOTE — Telephone Encounter (Signed)
New Problem  Pt has a question regarding a stent.

## 2012-08-11 ENCOUNTER — Encounter (HOSPITAL_COMMUNITY): Payer: Self-pay | Admitting: Family Medicine

## 2012-08-11 ENCOUNTER — Emergency Department (HOSPITAL_COMMUNITY): Payer: Medicare Other

## 2012-08-11 ENCOUNTER — Telehealth: Payer: Self-pay

## 2012-08-11 ENCOUNTER — Emergency Department (HOSPITAL_COMMUNITY)
Admission: EM | Admit: 2012-08-11 | Discharge: 2012-08-11 | Disposition: A | Discharge status: Home / Self Care | Payer: Medicare Other | Attending: Emergency Medicine | Admitting: Emergency Medicine

## 2012-08-11 DIAGNOSIS — Z8679 Personal history of other diseases of the circulatory system: Secondary | ICD-10-CM | POA: Insufficient documentation

## 2012-08-11 DIAGNOSIS — Z7982 Long term (current) use of aspirin: Secondary | ICD-10-CM | POA: Insufficient documentation

## 2012-08-11 DIAGNOSIS — E785 Hyperlipidemia, unspecified: Secondary | ICD-10-CM | POA: Insufficient documentation

## 2012-08-11 DIAGNOSIS — F329 Major depressive disorder, single episode, unspecified: Secondary | ICD-10-CM | POA: Insufficient documentation

## 2012-08-11 DIAGNOSIS — F410 Panic disorder [episodic paroxysmal anxiety] without agoraphobia: Secondary | ICD-10-CM | POA: Insufficient documentation

## 2012-08-11 DIAGNOSIS — Z79899 Other long term (current) drug therapy: Secondary | ICD-10-CM | POA: Insufficient documentation

## 2012-08-11 DIAGNOSIS — I10 Essential (primary) hypertension: Secondary | ICD-10-CM | POA: Insufficient documentation

## 2012-08-11 DIAGNOSIS — Z9089 Acquired absence of other organs: Secondary | ICD-10-CM | POA: Insufficient documentation

## 2012-08-11 DIAGNOSIS — I2510 Atherosclerotic heart disease of native coronary artery without angina pectoris: Secondary | ICD-10-CM | POA: Insufficient documentation

## 2012-08-11 DIAGNOSIS — M1990 Unspecified osteoarthritis, unspecified site: Secondary | ICD-10-CM | POA: Insufficient documentation

## 2012-08-11 DIAGNOSIS — K5900 Constipation, unspecified: Secondary | ICD-10-CM | POA: Insufficient documentation

## 2012-08-11 DIAGNOSIS — F3289 Other specified depressive episodes: Secondary | ICD-10-CM | POA: Insufficient documentation

## 2012-08-11 DIAGNOSIS — R109 Unspecified abdominal pain: Secondary | ICD-10-CM | POA: Insufficient documentation

## 2012-08-11 DIAGNOSIS — E669 Obesity, unspecified: Secondary | ICD-10-CM | POA: Insufficient documentation

## 2012-08-11 DIAGNOSIS — G4730 Sleep apnea, unspecified: Secondary | ICD-10-CM | POA: Insufficient documentation

## 2012-08-11 DIAGNOSIS — N390 Urinary tract infection, site not specified: Secondary | ICD-10-CM

## 2012-08-11 DIAGNOSIS — Z853 Personal history of malignant neoplasm of breast: Secondary | ICD-10-CM | POA: Insufficient documentation

## 2012-08-11 DIAGNOSIS — G8929 Other chronic pain: Secondary | ICD-10-CM | POA: Insufficient documentation

## 2012-08-11 LAB — CBC WITH DIFFERENTIAL/PLATELET
Basophils Absolute: 0 10*3/uL (ref 0.0–0.1)
Basophils Relative: 0 % (ref 0–1)
MCHC: 32.6 g/dL (ref 30.0–36.0)
Monocytes Absolute: 0.6 10*3/uL (ref 0.1–1.0)
Neutro Abs: 5.8 10*3/uL (ref 1.7–7.7)
Neutrophils Relative %: 66 % (ref 43–77)
Platelets: 307 10*3/uL (ref 150–400)
RDW: 16.2 % — ABNORMAL HIGH (ref 11.5–15.5)

## 2012-08-11 LAB — COMPREHENSIVE METABOLIC PANEL
AST: 23 U/L (ref 0–37)
Albumin: 3.5 g/dL (ref 3.5–5.2)
Chloride: 102 mEq/L (ref 96–112)
Creatinine, Ser: 0.83 mg/dL (ref 0.50–1.10)
Potassium: 4.4 mEq/L (ref 3.5–5.1)
Total Bilirubin: 0.5 mg/dL (ref 0.3–1.2)

## 2012-08-11 LAB — URINALYSIS, ROUTINE W REFLEX MICROSCOPIC
Glucose, UA: NEGATIVE mg/dL
Hgb urine dipstick: NEGATIVE
Protein, ur: NEGATIVE mg/dL
Specific Gravity, Urine: 1.022 (ref 1.005–1.030)

## 2012-08-11 LAB — URINE MICROSCOPIC-ADD ON

## 2012-08-11 MED ORDER — IOHEXOL 300 MG/ML  SOLN
50.0000 mL | Freq: Once | INTRAMUSCULAR | Status: AC | PRN
  Administered 2012-08-11: 50 mL via ORAL

## 2012-08-11 MED ORDER — CIPROFLOXACIN HCL 500 MG PO TABS: 500.0000 mg | ORAL_TABLET | Freq: Two times a day (BID) | ORAL | Status: DC

## 2012-08-11 NOTE — ED Notes (Signed)
Attempted to place IV-IV RN paged for placement

## 2012-08-11 NOTE — ED Provider Notes (Addendum)
History    CSN: [id number] Arrival date & time 08/11/12  1454  First MD Initiated Contact with Patient 08/11/12 1539     Chief Complaint  Patient presents with  . Abdominal Pain   (Consider location/radiation/quality/duration/timing/severity/associated sxs/prior Treatment) HPI Comments: Patient presents with abdominal pain. She describes as a crampy pain primarily in the left side of her abdomen. It started about a week ago and has been gradually getting worse since then. She denies any nausea vomiting or diarrhea. She denies any urinary symptoms. She denies he fevers or chills. She has a history of ongoing constipation issues but says her last bowel movement was yesterday. Her last one before that was about 5 days ago. She states she has a history of irritable bowel syndrome however this was self diagnosed.  She had a history of breast cancer which is currently in remission.  Patient is a 77 y.o. female presenting with abdominal pain.  Abdominal Pain Associated symptoms include abdominal pain. Pertinent negatives include no chest pain, no headaches and no shortness of breath.   Past Medical History  Diagnosis Date  . Sleep apnea     insomnia  . Hyperlipidemia   . Hypertension   . Anxiety   . Chronic headache   . Osteoarthritis   . Panic attacks   . CAD (coronary artery disease)   . Depression   . Chronic chest pain   . Malignant neoplasm of breast     right breast   Past Surgical History  Procedure Laterality Date  . Cholecystectomy    . Right mastectomy    . Tonsillectomy    . Knee surgery    . US echocardiography  10/04/2006    EF 55-60%   No family history on file. History  Substance Use Topics  . Smoking status: Never Smoker   . Smokeless tobacco: Not on file  . Alcohol Use: No   OB History   Grav Para Term Preterm Abortions TAB SAB Ect Mult Living                 Review of Systems  Constitutional: Negative for fever, chills, diaphoresis and fatigue.   HENT: Negative for congestion, rhinorrhea and sneezing.   Eyes: Negative.   Respiratory: Negative for cough, chest tightness and shortness of breath.   Cardiovascular: Negative for chest pain and leg swelling.  Gastrointestinal: Positive for abdominal pain and constipation. Negative for nausea, vomiting, diarrhea and blood in stool.  Genitourinary: Negative for frequency, hematuria, flank pain and difficulty urinating.  Musculoskeletal: Positive for arthralgias (chronic arthritis). Negative for back pain.  Skin: Negative for rash.  Neurological: Negative for dizziness, speech difficulty, weakness, numbness and headaches.    Allergies  Review of patient's allergies indicates no known allergies.  Home Medications   Current Outpatient Rx  Name  Route  Sig  Dispense  Refill  . amitriptyline (ELAVIL) 100 MG tablet   Oral   Take 100 mg by mouth at bedtime.         Marland Kitchen amoxicillin (AMOXIL) 500 MG capsule   Oral   Take 1 capsule (500 mg total) by mouth 3 (three) times daily.   21 capsule   0   . anastrozole (ARIMIDEX) 1 MG tablet   Oral   Take 1 tablet (1 mg total) by mouth daily.   30 tablet   6   . aspirin 81 MG EC tablet   Oral   Take 81 mg by mouth daily.           Marland Kitchen  atorvastatin (LIPITOR) 40 MG tablet      TAKE 1 TABLET BY MOUTH EVERY DAY   30 tablet   8   . bisacodyl (DULCOLAX) 5 MG EC tablet   Oral   Take 5 mg by mouth daily as needed.         . Calcium Carbonate-Vit D-Min (CALTRATE 600+D PLUS) 600-400 MG-UNIT per tablet   Oral   Take 1 tablet by mouth daily.           Marland Kitchen LORazepam (ATIVAN) 2 MG tablet   Oral   Take 2 mg by mouth every 6 (six) hours as needed for anxiety. PRN............Marland KitchenMarland KitchenAnxiety and panic attacks         . MAGNESIUM OXIDE PO   Oral   Take 1 tablet by mouth daily. Pt doesn't know the dose of the medication.         . Omega-3 Fatty Acids (FISH OIL) 1000 MG CAPS   Oral   Take 1,000 mg by mouth daily.         . quinapril  (ACCUPRIL) 20 MG tablet   Oral   Take 20 mg by mouth at bedtime.          . ciprofloxacin (CIPRO) 500 MG tablet   Oral   Take 1 tablet (500 mg total) by mouth 2 (two) times daily. One po bid x 7 days   14 tablet   0   . NITROSTAT 0.4 MG SL tablet      PLACE 1 TABLET (0.4 MG TOTAL) UNDER THE TONGUE EVERY 5 (FIVE) MINUTES AS NEEDED FOR CHEST PAIN.   25 tablet   2    BP 140/62  Pulse 85  Temp(Src) 97.7 F (36.5 C) (Oral)  Resp 20  Ht 5\' 4"  (1.626 m)  Wt 170 lb (77.111 kg)  BMI 29.17 kg/m2  SpO2 93% Physical Exam  Constitutional: She is oriented to person, place, and time. She appears well-developed and well-nourished.  Obese  HENT:  Head: Normocephalic and atraumatic.  Eyes: Pupils are equal, round, and reactive to light.  Neck: Normal range of motion. Neck supple.  Cardiovascular: Normal rate, regular rhythm and normal heart sounds.   Pulmonary/Chest: Effort normal and breath sounds normal. No respiratory distress. She has no wheezes. She has no rales. She exhibits no tenderness.  Abdominal: Soft. Bowel sounds are normal. There is tenderness (moderate tenderness to left upper and lower quadrants). There is no rebound and no guarding.  Musculoskeletal: Normal range of motion. She exhibits no edema.  Lymphadenopathy:    She has no cervical adenopathy.  Neurological: She is alert and oriented to person, place, and time.  Skin: Skin is warm and dry. No rash noted.  Psychiatric: She has a normal mood and affect.    ED Course  Procedures (including critical care time) Results for orders placed during the hospital encounter of 08/11/12  CBC WITH DIFFERENTIAL      Result Value Range   WBC 8.8  4.0 - 10.5 K/uL   RBC 4.86  3.87 - 5.11 MIL/uL   Hemoglobin 12.9  12.0 - 15.0 g/dL   HCT 16.1  09.6 - 04.5 %   MCV 81.5  78.0 - 100.0 fL   MCH 26.5  26.0 - 34.0 pg   MCHC 32.6  30.0 - 36.0 g/dL   RDW 40.9 (*) 81.1 - 91.4 %   Platelets 307  150 - 400 K/uL   Neutrophils Relative  % 66  43 - 77 %  Neutro Abs 5.8  1.7 - 7.7 K/uL   Lymphocytes Relative 22  12 - 46 %   Lymphs Abs 2.0  0.7 - 4.0 K/uL   Monocytes Relative 7  3 - 12 %   Monocytes Absolute 0.6  0.1 - 1.0 K/uL   Eosinophils Relative 4  0 - 5 %   Eosinophils Absolute 0.4  0.0 - 0.7 K/uL   Basophils Relative 0  0 - 1 %   Basophils Absolute 0.0  0.0 - 0.1 K/uL  COMPREHENSIVE METABOLIC PANEL      Result Value Range   Sodium 137  135 - 145 mEq/L   Potassium 4.4  3.5 - 5.1 mEq/L   Chloride 102  96 - 112 mEq/L   CO2 24  19 - 32 mEq/L   Glucose, Bld 97  70 - 99 mg/dL   BUN 17  6 - 23 mg/dL   Creatinine, Ser 2.13  0.50 - 1.10 mg/dL   Calcium 9.7  8.4 - 08.6 mg/dL   Total Protein 6.9  6.0 - 8.3 g/dL   Albumin 3.5  3.5 - 5.2 g/dL   AST 23  0 - 37 U/L   ALT 28  0 - 35 U/L   Alkaline Phosphatase 239 (*) 39 - 117 U/L   Total Bilirubin 0.5  0.3 - 1.2 mg/dL   GFR calc non Af Amer 67 (*) >90 mL/min   GFR calc Af Amer 78 (*) >90 mL/min  URINALYSIS, ROUTINE W REFLEX MICROSCOPIC      Result Value Range   Color, Urine YELLOW  YELLOW   APPearance CLOUDY (*) CLEAR   Specific Gravity, Urine 1.022  1.005 - 1.030   pH 6.0  5.0 - 8.0   Glucose, UA NEGATIVE  NEGATIVE mg/dL   Hgb urine dipstick NEGATIVE  NEGATIVE   Bilirubin Urine NEGATIVE  NEGATIVE   Ketones, ur NEGATIVE  NEGATIVE mg/dL   Protein, ur NEGATIVE  NEGATIVE mg/dL   Urobilinogen, UA 0.2  0.0 - 1.0 mg/dL   Nitrite NEGATIVE  NEGATIVE   Leukocytes, UA LARGE (*) NEGATIVE  URINE MICROSCOPIC-ADD ON      Result Value Range   Squamous Epithelial / LPF RARE  RARE   WBC, UA TOO NUMEROUS TO COUNT  <3 WBC/hpf   Bacteria, UA FEW (*) RARE   Ct Abdomen Pelvis Wo Contrast  08/11/2012   *RADIOLOGY REPORT*  Clinical Data: Left-sided abdominal pain  CT ABDOMEN AND PELVIS WITHOUT CONTRAST  Technique:  Multidetector CT imaging of the abdomen and pelvis was performed following the standard protocol without intravenous contrast.  Comparison: 08/09/2011; 09/01/2005;  03/04/2005  Findings:  The lack of intravenous contrast limits the ability to evaluate solid abdominal organs.  Normal noncontrast appearance of the bilateral kidneys.  No renal stones.  No urinary obstruction.  No stones are seen along the expected course of either ureter or within the urinary bladder. There is an unchanged punctate calcification adjacent to the dome of the urinary bladder (image 71, series 2).  Otherwise normal noncontrast appearance of the urinary bladder to the degree of distension.  Normal hepatic contour.  Post cholecystectomy.  No ascites.  There is a punctate calcification adjacent to the posterior aspect of the right lobe of the liver, unchanged.  Normal noncontrast appearance of the bilateral adrenal glands, pancreas and spleen.  Ingested enteric contrast extends to the level of the transverse colon.  No evidence of enteric obstruction.  Moderate colonic stool burden.  The sigmoid colon is  noted to be redundant.  Normal appearance of the appendix.  No pneumoperitoneum, pneumatosis or portal venous gas.  Scattered atherosclerotic plaque within a tortuous but normal caliber abdominal aorta.  The previously noted shoddy retroperitoneal and mesenteric lymph nodes have increased in size and number in the interval with index gastrohepatic node measuring approximately 1 cm in short axis diameter (image 15, series 2) and index mesenteric node measuring approximately 1.1 cm (image 36).  Index mesenteric node within the right lower abdominal quadrant measures approximate 1.1 cm (image 41).  Interval increase in size of right pelvic sidewall lymph node, now measuring approximately 0.9 cm in diameter (image 64, series 2)., previously 0.7 cm. Grossly unchanged of para esophageal lymph node measuring 1 cm in diameter (image 1).  The approximately 5.1 x 4.0 cm hypoattenuating left-sided adnexal lesion is grossly unchanged since the 08/09/2011 examination but minimally increased since the 2007 examination  at which time it measured approximately 3.6 x 2.8 cm.  The pelvic organs are otherwise normal for age.  No free fluid in the pelvis.  Limited visualization of the lower thorax demonstrates an unchanged approximate 6 mm nodule within the left lower lobe (image 7, series 4) stable since the 2007 examination and loss of benign etiology. There is minimal bilateral dependent ground-glass atelectasis and subsegmental atelectasis.  Normal heart size.  No pericardial effusion.  No acute or aggressive osseous abnormalities.  Moderate to severe multilevel lumbar spine DDD, worse at T12 - L1 and L4 - L5.  IMPRESSION:  1.  No explanation for the patient's abdominal pain.  Specifically, no evidence of enteric or urinary obstruction. 2.  Interval increase in nonspecific retroperitoneal and mesenteric lymphadenopathy.  While possibly of inflammatory etiology other etiologies (such as lymphoma) may have a similar appearance. Clinical correlation is advised. 3.  Grossly unchanged approximately 5.1 cm hypoattenuating left- sided adnexal lesion, stable since the 2013 examination though minimally increased since the 2007 exam. Further evaluation with a non emergent pelvic ultrasound and/or pelvic MRI may be performed as clinically indicated.   Original Report Authenticated By: Tacey Ruiz, MD      1. Abdominal pain   2. UTI (lower urinary tract infection)     MDM  Patient has nothing appears to be acute in her abdomen. She is well-appearing with no vomiting or significant tenderness on repeat abdominal exam. She was encouraged to use her MiraLAX at home for her constipation. She was treated with Cipro for a urinary tract infection. She has an appointment next week to followup with a gastroenterologist. Advised her that she will need followup on the enlarged lymph nodes in the abdomen. She was discharged with a copy of her CT scan to go to her followup appointment.  Rolan Bucco, MD 08/11/12 2300  Rolan Bucco,  MD 08/11/12 [phone]

## 2012-08-11 NOTE — Telephone Encounter (Signed)
Pt called requesting her care be transferred to Dr Gray Bernhardt or Dr Arbutus Ped. This message forwarded to [facility].

## 2012-08-11 NOTE — ED Notes (Signed)
Patient states that she has had abdominal pain over the past week and that the pain has gotten worse. Patient denies nausea, vomiting, and diarrhea. Denies dysuria. Reports constipation.

## 2012-08-11 NOTE — ED Notes (Signed)
Patient transported to CT 

## 2012-08-12 ENCOUNTER — Telehealth (HOSPITAL_COMMUNITY): Payer: Self-pay | Admitting: Emergency Medicine

## 2012-08-13 LAB — URINE CULTURE: Colony Count: 15000

## 2012-08-14 ENCOUNTER — Telehealth: Payer: Self-pay | Admitting: Medical Oncology

## 2012-08-14 ENCOUNTER — Telehealth: Payer: Self-pay | Admitting: Cardiology

## 2012-08-14 NOTE — Telephone Encounter (Signed)
Pt called and left a message requesting that her care be transferred to Dr. Darnelle Catalan. I sent this message to [place].

## 2012-08-14 NOTE — Telephone Encounter (Signed)
Spoke with pt, she had to go to the ER over the weekend. Questions answered. She has a UTI.

## 2012-08-14 NOTE — ED Notes (Signed)
Post ED Visit - Positive Culture Follow-up  Culture report reviewed by antimicrobial stewardship pharmacist: []  Wes Dulaney, Pharm.D., BCPS [x]  Celedonio Miyamoto, Pharm.D., BCPS []  Georgina Pillion, Pharm.D., BCPS []  [place], [address].D., BCPS, AAHIVP []  Estella Husk, Pharm.D., BCPS, AAHIVP  Positive urine culture Treated with cipro, organism sensitive to the same and no further patient follow-up is required at this time.  Larena Sox 08/14/2012, 4:58 PM

## 2012-08-14 NOTE — Telephone Encounter (Signed)
New Problem:    Patient called in wanting you to call her back.  Please call back.

## 2012-08-16 ENCOUNTER — Telehealth: Payer: Self-pay | Admitting: Cardiology

## 2012-08-16 NOTE — Telephone Encounter (Signed)
New Problem:    Patient called in wanting to speak with you about her atorvastatin (LIPITOR) 40 MG tablet.  Please call back.

## 2012-08-17 ENCOUNTER — Telehealth: Payer: Self-pay | Admitting: Oncology

## 2012-08-17 NOTE — Telephone Encounter (Signed)
Per email from Skip former pt of DM requesting transfer to [facility]. Per email response from [facility] he will see pt. 04/05/13 appt moved to [facility] and pt will keep 10/03/12 lb as scheduled. lmonvm for pt re above confirming 10/03/12 lb d/t and new time for lb/GM 2/19 @ 1pm. Schedule mailed.

## 2012-08-17 NOTE — Telephone Encounter (Signed)
Spoke with pt, questions regarding lipitor and lipid panel answered.

## 2012-08-21 ENCOUNTER — Telehealth: Payer: Self-pay | Admitting: Internal Medicine

## 2012-08-21 MED ORDER — AMOXICILLIN 500 MG PO CAPS: 500.0000 mg | ORAL_CAPSULE | Freq: Three times a day (TID) | ORAL | Status: DC
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
## 2012-08-21 NOTE — Telephone Encounter (Signed)
Pt aware of recs. RX sent. Pt also wants Dr. Maple Hudson to review her ER visit from Friday. She stated she is not sure if he was aware she went. Will forward to Dr. Maple Hudson.

## 2012-08-21 NOTE — Telephone Encounter (Signed)
Ok to refill amoxacillin We will not be restarting CPAP at this time.

## 2012-08-22 ENCOUNTER — Telehealth: Payer: Self-pay | Admitting: Cardiology

## 2012-08-22 NOTE — Telephone Encounter (Signed)
New problem  Pt states she has a question about pain.  She would not tell me anything else.

## 2012-08-22 NOTE — Telephone Encounter (Addendum)
Spoke with pt, we discussed her recent appt with the digestive health specialist. She took the medicine they gave her for her constipation and this am she had pain around her stomach and back pain. Questions answered.

## 2012-08-23 ENCOUNTER — Telehealth: Payer: Self-pay | Admitting: Internal Medicine

## 2012-08-23 NOTE — Telephone Encounter (Signed)
She called recently to let us know of ER visit for abd pain. Note that imaging showed retroperitoneal and mesenteric nodes. She has GI f/u appointment scheduled. This is not a pulmonary problem.

## 2012-08-24 NOTE — Telephone Encounter (Signed)
I reviewed ER note. Ok to close NSG train.

## 2012-08-24 NOTE — Telephone Encounter (Signed)
pls advise if nsg can be closed, thank you

## 2012-08-31 ENCOUNTER — Telehealth (HOSPITAL_COMMUNITY): Payer: Self-pay | Admitting: Dietician

## 2012-08-31 ENCOUNTER — Telehealth: Payer: Self-pay | Admitting: Cardiology

## 2012-08-31 NOTE — Telephone Encounter (Signed)
Received voicemail from pt left at 10:45 AM. Pt reports she is a breast cancer survivor of 11 years. She also reports hx of high cholesterol (on Lipitor). She is inquiring about a heart healthy diet. She in concerned about how many eggs she eats. Called back at 1229. Educated pt on heart healthy diet. Discussed cholesterol content of eggs. Suggested adding egg whites to whole eggs for additional volume and protein and substituting egg whites for egg yolks on some days. Also discussed specific ways to decrease cholesterol in diet. Pt reports her cholesterol readings have been good and she has been trying to eat more fruits and vegetables. Pt grateful for information. Teachback method used.   Melody Haver, RD, LDN

## 2012-08-31 NOTE — Telephone Encounter (Signed)
Labs placed in the mail to pt home address

## 2012-08-31 NOTE — Telephone Encounter (Signed)
New problem    Pt calling about a copy of lab report from 03/23/12-per pt please do not call her back-copy can be mailed

## 2012-09-05 ENCOUNTER — Telehealth: Payer: Self-pay | Admitting: Internal Medicine

## 2012-09-05 NOTE — Telephone Encounter (Signed)
Spoke with pt She states her o2 concentrator stopped working last night She has called [facility] already and they are coming out between 12 and 4 pm to fix this for her Nothing further needed per pt

## 2012-09-06 ENCOUNTER — Telehealth: Payer: Self-pay | Admitting: Cardiology

## 2012-09-06 NOTE — Telephone Encounter (Signed)
New Prob  Pt wants to speak with you regarding a lab report she received.

## 2012-09-06 NOTE — Telephone Encounter (Signed)
Spoke with pt, questions answered.

## 2012-09-07 ENCOUNTER — Telehealth: Payer: Self-pay | Admitting: Internal Medicine

## 2012-09-07 NOTE — Telephone Encounter (Signed)
LMTCB

## 2012-09-08 NOTE — Telephone Encounter (Signed)
Pt stated nothing needed. [name], CMA

## 2012-09-11 ENCOUNTER — Telehealth: Payer: Self-pay | Admitting: Cardiology

## 2012-09-11 ENCOUNTER — Telehealth: Payer: Self-pay | Admitting: Internal Medicine

## 2012-09-11 NOTE — Telephone Encounter (Signed)
ATC pt her line rang busy x 3 wcb

## 2012-09-11 NOTE — Telephone Encounter (Signed)
Spoke with pt, questions regarding labs answered. 

## 2012-09-11 NOTE — Telephone Encounter (Signed)
New Prob     Pt has some questions regarding her lab report. Please call.

## 2012-09-12 NOTE — Telephone Encounter (Signed)
ATC patient no answer [id number] 
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
## 2012-09-13 NOTE — Telephone Encounter (Signed)
Pt is aware of CY recs. She wasn't very happy with this. Pt hung up on me.

## 2012-09-13 NOTE — Telephone Encounter (Signed)
CPAP would not help now. Oxygen is better choice for her.

## 2012-09-13 NOTE — Telephone Encounter (Signed)
lmtcb

## 2012-09-13 NOTE — Telephone Encounter (Signed)
Pt returned call. [name] °

## 2012-09-15 ENCOUNTER — Telehealth (INDEPENDENT_AMBULATORY_CARE_PROVIDER_SITE_OTHER): Payer: Self-pay

## 2012-09-15 ENCOUNTER — Telehealth: Payer: Self-pay | Admitting: *Deleted

## 2012-09-15 DIAGNOSIS — Z853 Personal history of malignant neoplasm of breast: Secondary | ICD-10-CM

## 2012-09-15 NOTE — Telephone Encounter (Signed)
Pt called wanting to know who would make a referral for her if she wanted to see another lung MD instead of Dr Maple Hudson. I advised pt she needs to work with her PCP or Dr Roxy Cedar office re: this second opinion. Pt states she understands and will call her PCP office.

## 2012-09-15 NOTE — Telephone Encounter (Signed)
Referral to [facility] per pt request.

## 2012-09-19 ENCOUNTER — Emergency Department (HOSPITAL_COMMUNITY): Payer: Medicare Other

## 2012-09-19 ENCOUNTER — Inpatient Hospital Stay (HOSPITAL_COMMUNITY)
Admission: EM | Admit: 2012-09-19 | Discharge: 2012-09-21 | DRG: 390 | Disposition: A | Discharge status: Home / Self Care | Payer: Medicare Other | Attending: Internal Medicine | Admitting: Internal Medicine

## 2012-09-19 ENCOUNTER — Encounter (HOSPITAL_COMMUNITY): Payer: Self-pay | Admitting: Emergency Medicine

## 2012-09-19 DIAGNOSIS — G4730 Sleep apnea, unspecified: Secondary | ICD-10-CM

## 2012-09-19 DIAGNOSIS — F410 Panic disorder [episodic paroxysmal anxiety] without agoraphobia: Secondary | ICD-10-CM | POA: Diagnosis present

## 2012-09-19 DIAGNOSIS — G4700 Insomnia, unspecified: Secondary | ICD-10-CM | POA: Diagnosis present

## 2012-09-19 DIAGNOSIS — F3289 Other specified depressive episodes: Secondary | ICD-10-CM | POA: Diagnosis present

## 2012-09-19 DIAGNOSIS — I2510 Atherosclerotic heart disease of native coronary artery without angina pectoris: Secondary | ICD-10-CM

## 2012-09-19 DIAGNOSIS — R599 Enlarged lymph nodes, unspecified: Secondary | ICD-10-CM | POA: Diagnosis present

## 2012-09-19 DIAGNOSIS — Z9861 Coronary angioplasty status: Secondary | ICD-10-CM

## 2012-09-19 DIAGNOSIS — G4733 Obstructive sleep apnea (adult) (pediatric): Secondary | ICD-10-CM | POA: Diagnosis present

## 2012-09-19 DIAGNOSIS — N83209 Unspecified ovarian cyst, unspecified side: Secondary | ICD-10-CM | POA: Diagnosis present

## 2012-09-19 DIAGNOSIS — M1990 Unspecified osteoarthritis, unspecified site: Secondary | ICD-10-CM | POA: Diagnosis present

## 2012-09-19 DIAGNOSIS — Z853 Personal history of malignant neoplasm of breast: Secondary | ICD-10-CM

## 2012-09-19 DIAGNOSIS — Z79899 Other long term (current) drug therapy: Secondary | ICD-10-CM

## 2012-09-19 DIAGNOSIS — I10 Essential (primary) hypertension: Secondary | ICD-10-CM | POA: Diagnosis present

## 2012-09-19 DIAGNOSIS — Z91199 Patient's noncompliance with other medical treatment and regimen due to unspecified reason: Secondary | ICD-10-CM

## 2012-09-19 DIAGNOSIS — E785 Hyperlipidemia, unspecified: Secondary | ICD-10-CM | POA: Diagnosis present

## 2012-09-19 DIAGNOSIS — Z6830 Body mass index (BMI) 30.0-30.9, adult: Secondary | ICD-10-CM

## 2012-09-19 DIAGNOSIS — Z9119 Patient's noncompliance with other medical treatment and regimen: Secondary | ICD-10-CM

## 2012-09-19 DIAGNOSIS — F329 Major depressive disorder, single episode, unspecified: Secondary | ICD-10-CM | POA: Diagnosis present

## 2012-09-19 DIAGNOSIS — Z9010 Acquired absence of unspecified breast and nipple: Secondary | ICD-10-CM

## 2012-09-19 DIAGNOSIS — K56609 Unspecified intestinal obstruction, unspecified as to partial versus complete obstruction: Principal | ICD-10-CM

## 2012-09-19 LAB — CBC WITH DIFFERENTIAL/PLATELET
Basophils Relative: 0 % (ref 0–1)
Eosinophils Absolute: 0.6 10*3/uL (ref 0.0–0.7)
Eosinophils Relative: 4 % (ref 0–5)
Lymphs Abs: 1.6 10*3/uL (ref 0.7–4.0)
MCH: 26.8 pg (ref 26.0–34.0)
MCHC: 32.9 g/dL (ref 30.0–36.0)
MCV: 81.6 fL (ref 78.0–100.0)
Neutrophils Relative %: 79 % — ABNORMAL HIGH (ref 43–77)
Platelets: 313 10*3/uL (ref 150–400)
RBC: 5.37 MIL/uL — ABNORMAL HIGH (ref 3.87–5.11)
RDW: 16.4 % — ABNORMAL HIGH (ref 11.5–15.5)

## 2012-09-19 LAB — COMPREHENSIVE METABOLIC PANEL
ALT: 27 U/L (ref 0–35)
Albumin: 3.8 g/dL (ref 3.5–5.2)
BUN: 14 mg/dL (ref 6–23)
Calcium: 10 mg/dL (ref 8.4–10.5)
GFR calc Af Amer: >90 mL/min (ref >90)
Glucose, Bld: 137 mg/dL — ABNORMAL HIGH (ref 70–99)
Potassium: 4 mEq/L (ref 3.5–5.1)
Sodium: 137 mEq/L (ref 135–145)
Total Protein: 7.6 g/dL (ref 6.0–8.3)

## 2012-09-19 LAB — URINALYSIS, ROUTINE W REFLEX MICROSCOPIC
Hgb urine dipstick: NEGATIVE
Ketones, ur: NEGATIVE mg/dL
Nitrite: NEGATIVE
Protein, ur: NEGATIVE mg/dL
Specific Gravity, Urine: 1.035 — ABNORMAL HIGH (ref 1.005–1.030)
Urobilinogen, UA: 0.2 mg/dL (ref 0.0–1.0)

## 2012-09-19 LAB — GLUCOSE, CAPILLARY

## 2012-09-19 LAB — LIPASE, BLOOD: Lipase: 21 U/L (ref 11–59)

## 2012-09-19 MED ORDER — ACETAMINOPHEN 325 MG PO TABS
650.0000 mg | ORAL_TABLET | Freq: Four times a day (QID) | ORAL | Status: DC | PRN
  Administered 2012-09-21: 650 mg via ORAL
  Filled 2012-09-19: qty 2

## 2012-09-19 MED ORDER — ASPIRIN 300 MG RE SUPP
300.0000 mg | Freq: Every day | RECTAL | Status: DC
  Filled 2012-09-19 (×3): qty 1

## 2012-09-19 MED ORDER — KCL IN DEXTROSE-NACL 20-5-0.45 MEQ/L-%-% IV SOLN
Freq: Once | INTRAVENOUS | Status: AC
  Administered 2012-09-19: 21:00:00 via INTRAVENOUS
  Filled 2012-09-19: qty 1000

## 2012-09-19 MED ORDER — SODIUM CHLORIDE 0.9 % IV SOLN
INTRAVENOUS | Status: DC
  Administered 2012-09-20: 04:00:00 via INTRAVENOUS

## 2012-09-19 MED ORDER — ONDANSETRON HCL 4 MG PO TABS: 4.0000 mg | ORAL_TABLET | Freq: Four times a day (QID) | ORAL | Status: DC | PRN

## 2012-09-19 MED ORDER — IOHEXOL 300 MG/ML  SOLN
100.0000 mL | Freq: Once | INTRAMUSCULAR | Status: AC | PRN
  Administered 2012-09-19: 100 mL via INTRAVENOUS

## 2012-09-19 MED ORDER — MORPHINE SULFATE 2 MG/ML IJ SOLN: 1.0000 mg | INTRAMUSCULAR | Status: DC | PRN

## 2012-09-19 MED ORDER — ONDANSETRON HCL 4 MG/2ML IJ SOLN
4.0000 mg | Freq: Once | INTRAMUSCULAR | Status: AC
  Administered 2012-09-19: 4 mg via INTRAVENOUS
  Filled 2012-09-19: qty 2

## 2012-09-19 MED ORDER — HYDROCODONE-ACETAMINOPHEN 5-325 MG PO TABS: 2.0000 | ORAL_TABLET | Freq: Once | ORAL | Status: DC

## 2012-09-19 MED ORDER — ONDANSETRON HCL 4 MG/2ML IJ SOLN: 4.0000 mg | Freq: Four times a day (QID) | INTRAMUSCULAR | Status: DC | PRN

## 2012-09-19 MED ORDER — NITROGLYCERIN 0.4 MG SL SUBL: 0.4000 mg | SUBLINGUAL_TABLET | SUBLINGUAL | Status: DC | PRN

## 2012-09-19 MED ORDER — LORAZEPAM 2 MG/ML IJ SOLN
0.5000 mg | INTRAMUSCULAR | Status: DC | PRN
  Administered 2012-09-19: 0.5 mg via INTRAVENOUS
  Filled 2012-09-19: qty 1

## 2012-09-19 MED ORDER — IOHEXOL 300 MG/ML  SOLN: 50.0000 mL | Freq: Once | INTRAMUSCULAR | Status: DC | PRN

## 2012-09-19 MED ORDER — HYDROMORPHONE HCL PF 1 MG/ML IJ SOLN
1.0000 mg | Freq: Once | INTRAMUSCULAR | Status: AC
  Administered 2012-09-19: 1 mg via INTRAVENOUS
  Filled 2012-09-19: qty 1

## 2012-09-19 MED ORDER — HYDRALAZINE HCL 20 MG/ML IJ SOLN: 10.0000 mg | INTRAMUSCULAR | Status: DC | PRN

## 2012-09-19 MED ORDER — SODIUM CHLORIDE 0.9 % IV BOLUS (SEPSIS)
1000.0000 mL | Freq: Once | INTRAVENOUS | Status: AC
  Administered 2012-09-19: 1000 mL via INTRAVENOUS

## 2012-09-19 MED ORDER — ACETAMINOPHEN 650 MG RE SUPP: 650.0000 mg | Freq: Four times a day (QID) | RECTAL | Status: DC | PRN

## 2012-09-19 NOTE — ED Notes (Signed)
ROOM [id number] ASSIGNED@[id number] 

## 2012-09-19 NOTE — Consult Note (Signed)
Reason for Consult: PSBO/ abdominal pain/constipation Referring Physician: Knute Neu [name] is an 77 y.o. female.  HPI: Pt has been to ER in June with complaints of abdominal pain.  She had a UTI's and trouble with constipation.  Constipation is a chronic and ongoing problem.  She also had a CT back in June 2013 also for abdominal pain. Both CT showed a left ovarian cyst.  The last CT in June this year showed some non specific retroperitoneal and mesenteric lymphadenopathy.  She remains concerned about this.  She apparently is also concerned about ovarian cancer, although she tells me Dr. Nicholas Lose told her she did not have it before he retired. Today she presents with an abdominal pain she describes as being over her mid upper abdomen.  She said it was more like a numbness.  She said it was followed by some nausea and she vomited once.  She says that was due to her supper last PM, a Pot pie she cooked and felt something was wrong with it, but ate it anyway.  She says she does not have pain now, but it's due to the IV pain medicine she got when she first got here. Films taken in ER show some dialted loops of small bowel,LUQ and some air fluid levels.  Moderate stool burden, clips in RUQ from cholecystectomy.  Her exam is pretty benign, she has no more nausea.  We are awaiting a CT Scan  Past Medical History  Diagnosis Date  . Sleep apnea     insomnia  . Hyperlipidemia   . Hypertension   . Anxiety   . Chronic headache   . Osteoarthritis   . Panic attacks/she is followed by Psychiatry for her issues.   Marland Kitchen CAD (coronary artery disease)   . Depression   . Chronic chest pain   . Malignant neoplasm of breast     right breast  . Constipation     Past Surgical History  Procedure Laterality Date  . Cholecystectomy    . Right mastectomy    . Tonsillectomy    . Knee surgery    . US echocardiography  10/04/2006    EF 55-60%  . Heart stent  She is on ASA 81 mg daily  May 2011 per pt  .  Portacath placement      x 2  . Inner ear surgery      History reviewed. No pertinent family history.  Social History:  reports that she has never smoked. She has never used smokeless tobacco. She reports that she does not drink alcohol or use illicit drugs. She lives alone.  She has children she never sees.  She is wheel chair and Bed bound.  She has help at home 3 times a week.  Allergies: No Known Allergies  Medications:  Prior to Admission:  (Not in a hospital admission) Scheduled:  Continuous:  [id number] Anti-infectives   None      Results for orders placed during the hospital encounter of 09/19/12 (from the past 48 hour(s))  CBC WITH DIFFERENTIAL     Status: Abnormal   Collection Time    09/19/12  1:25 PM      Result Value Range   WBC 14.2 (*) 4.0 - 10.5 K/uL   RBC 5.37 (*) 3.87 - 5.11 MIL/uL   Hemoglobin 14.4  12.0 - 15.0 g/dL   HCT 09.8  11.9 - 14.7 %   MCV 81.6  78.0 - 100.0 fL   MCH 26.8  26.0 - 34.0 pg   MCHC 32.9  30.0 - 36.0 g/dL   RDW 40.9 (*) 81.1 - 91.4 %   Platelets 313  150 - 400 K/uL   Neutrophils Relative % 79 (*) 43 - 77 %   Neutro Abs 11.3 (*) 1.7 - 7.7 K/uL   Lymphocytes Relative 11 (*) 12 - 46 %   Lymphs Abs 1.6  0.7 - 4.0 K/uL   Monocytes Relative 5  3 - 12 %   Monocytes Absolute 0.8  0.1 - 1.0 K/uL   Eosinophils Relative 4  0 - 5 %   Eosinophils Absolute 0.6  0.0 - 0.7 K/uL   Basophils Relative 0  0 - 1 %   Basophils Absolute 0.0  0.0 - 0.1 K/uL  COMPREHENSIVE METABOLIC PANEL     Status: Abnormal   Collection Time    09/19/12  1:25 PM      Result Value Range   Sodium 137  135 - 145 mEq/L   Potassium 4.0  3.5 - 5.1 mEq/L   Chloride 101  96 - 112 mEq/L   CO2 22  19 - 32 mEq/L   Glucose, Bld 137 (*) 70 - 99 mg/dL   BUN 14  6 - 23 mg/dL   Creatinine, Ser 7.82  0.50 - 1.10 mg/dL   Calcium 95.6  8.4 - 21.3 mg/dL   Total Protein 7.6  6.0 - 8.3 g/dL   Albumin 3.8  3.5 - 5.2 g/dL   AST 19  0 - 37 U/L   ALT 27  0 - 35 U/L   Alkaline  Phosphatase 276 (*) 39 - 117 U/L   Total Bilirubin 0.3  0.3 - 1.2 mg/dL   GFR calc non Af Amer 83 (*) >90 mL/min   GFR calc Af Amer >90  >90 mL/min   Comment:            The eGFR has been calculated     using the CKD EPI equation.     This calculation has not been     validated in all clinical     situations.     eGFR's persistently     <90 mL/min signify     possible Chronic Kidney Disease.  LIPASE, BLOOD     Status: None   Collection Time    09/19/12  1:25 PM      Result Value Range   Lipase 21  11 - 59 U/L  URINALYSIS, ROUTINE W REFLEX MICROSCOPIC     Status: Abnormal   Collection Time    09/19/12  2:38 PM      Result Value Range   Color, Urine AMBER (*) YELLOW   Comment: BIOCHEMICALS MAY BE AFFECTED BY COLOR   APPearance CLOUDY (*) CLEAR   Specific Gravity, Urine 1.035 (*) 1.005 - 1.030   pH 5.0  5.0 - 8.0   Glucose, UA NEGATIVE  NEGATIVE mg/dL   Hgb urine dipstick NEGATIVE  NEGATIVE   Bilirubin Urine SMALL (*) NEGATIVE   Ketones, ur NEGATIVE  NEGATIVE mg/dL   Protein, ur NEGATIVE  NEGATIVE mg/dL   Urobilinogen, UA 0.2  0.0 - 1.0 mg/dL   Nitrite NEGATIVE  NEGATIVE   Leukocytes, UA NEGATIVE  NEGATIVE   Comment: MICROSCOPIC NOT DONE ON URINES WITH NEGATIVE PROTEIN, BLOOD, LEUKOCYTES, NITRITE, OR GLUCOSE <1000 mg/dL.    Dg Abd Acute W/chest  09/19/2012   *RADIOLOGY REPORT*  Clinical Data: Generalized abdominal pain.  Nausea and vomiting. Chronic constipation.  Prior  history of right mastectomy for breast cancer.  Prior cholecystectomy.  ACUTE ABDOMEN SERIES (ABDOMEN 2 VIEW & CHEST 1 VIEW)  Comparison: Unenhanced CT abdomen and pelvis 08/11/2012.  One-view chest x-ray 04/27/2012.  Findings: Multiple dilated loops of small bowel in the left upper quadrant and in the mid abdomen, demonstrating air fluid levels on the lateral decubitus image.  No evidence of free intraperitoneal air.  Gas and moderate stool burden throughout normal caliber colon from cecum to rectum.  Surgical  clips in the right upper quadrant from prior cholecystectomy.  Phleboliths in the pelvis.  No visible opaque urinary tract calculi.  Degenerative changes involving the lower thoracic and lumbar spine.  Cardiac silhouette mildly enlarged for the AP technique, unchanged. Prominent paracardiac fat pad on the left accounts for the blunted costophrenic angle.  Mild linear scar at the right lung base, unchanged.  Prominent bronchovascular markings diffusely and central peribronchial thickening, unchanged, consistent with chronic bronchitis.  No new pulmonary parenchymal abnormalities. No visible pleural effusions.  IMPRESSION:  1.  Partial small bowel obstruction.  No free intraperitoneal air. 2.  Stable mild cardiomegaly, COPD (chronic bronchitis), and scarring in the lung bases.  No acute cardiopulmonary disease.   Original Report Authenticated By: Hulan Saas, M.D.    Review of Systems  Constitutional: Negative for fever, chills, weight loss, malaise/fatigue and diaphoresis.  HENT: Positive for hearing loss (cannot hear if you stand on the right or if you stand at her feet and talk).   Eyes: Negative.   Respiratory: Negative for cough, hemoptysis, sputum production, shortness of breath and wheezing.        She has sleep apnea and uses O2 at night  Cardiovascular: Positive for leg swelling. Negative for chest pain, palpitations, orthopnea, claudication and PND.  Gastrointestinal: Positive for nausea, vomiting, abdominal pain (describes a numb feeling across abdomen) and constipation (chronic constipation last BM 2 days ago). Negative for heartburn, diarrhea, blood in stool and melena.  Genitourinary: Negative.   Musculoskeletal: Negative.        She is at home  Alone and wheel chair bound  Skin: Negative.   Neurological: Positive for headaches (she says her headaches are caused because someone is picking her locks at home.  She can''t get anyone to believe her.). Negative for weakness.   Endo/Heme/Allergies: Negative.   Psychiatric/Behavioral: The patient is nervous/anxious.        She says she has not had a panic attack in some time. She is followed by a psychiatrist who sees her twice a year and times her, then give her her meds.   Blood pressure 157/82, pulse 110, temperature 98.2 F (36.8 C), temperature source Oral, resp. rate 18, SpO2 94.00%. Physical Exam  Constitutional: She is oriented to person, place, and time. No distress.  Obese poorly kept WF in no distress.  HENT:  Head: Normocephalic and atraumatic.  Nose: Nose normal.  She says she cannot hear you if you stand on her right side or at the foot of her bed.  Eyes: Conjunctivae and EOM are normal. Pupils are equal, round, and reactive to light. Right eye exhibits no discharge. Left eye exhibits no discharge. No scleral icterus.  Neck: Normal range of motion. Neck supple. No JVD present. No tracheal deviation present. No thyromegaly present.  Cardiovascular: Normal rate, regular rhythm, normal heart sounds and intact distal pulses.   No murmur heard. Respiratory: Effort normal and breath sounds normal. No respiratory distress. She has no wheezes. She has no rales.  She exhibits no tenderness.  GI: Soft. Bowel sounds are normal. She exhibits no distension and no mass. There is no tenderness. There is no rebound and no guarding.  Musculoskeletal: She exhibits no edema.  Lymphadenopathy:    She has no cervical adenopathy.  Neurological: She is alert and oriented to person, place, and time. No cranial nerve deficit.  Skin: Skin is warm and dry. No rash noted. She is not diaphoretic. No erythema. No pallor.  Psychiatric: She has a normal mood and affect. Her behavior is normal. Judgment normal.  She is convinced someone is picking her locks at home.  No one will believe her.  It causes her headaches.  She is very concerned about her prior CT scan.  Her doctors don't agree when it should be rechecked.     Assessment/Plan: 1. Abdominal pain, elevated WBC, possible SBO, concern for chronic constipation and lymphadenopathy from CT scan in June. 2.Sleep apnea on O2 at night. 3.Chronic anxiety/panic attacks/depression, followed by Psychiatry 4.Morbid obesity 5.CAD with stents 2011 Dr. Juanda Chance 6.hx of breast cancer with chemotherapy and right mastectomy. 11 years ago 7.Osteoarthritis 8. She has very limited mobility.   Plan:  CT is pending, Dr. Biagio Quint will see and review later this PM.  Sherrie George 09/19/2012, 4:23 PM

## 2012-09-19 NOTE — Consult Note (Signed)
I have seen and evaluated her.  She is very comfortable and there is no evidence of bowel compromise.  She is mildly distended and has dilated loops of small bowel on CT (as well as some lymphadenopathy) concerning for small bowel obstruction.  She does say that she is passing flatus and has moved her bowels 2 days ago.  Her history is consistent with partial SBO.  She is AF and her exam is not concerning.  No hernia.  I would recommend inpatient admission with IV fluids and NG tube decompression and bowel rest.  This is most likely adhesive partial small bowel obstruction but malignant possibility as well given history of breast cancer and intraabdominal lymphadenopathy.  Hopefully able to manage nonoperatively.  If no improvement with NG tube and bowel rest, then surgery may be indicated.  Surgery will follow while in the hospital.  Regardless if surgery is necessary and if her obstruction resolves, then I would recommend outpatient workup of her lymphadenopathy.

## 2012-09-19 NOTE — H&P (Signed)
[facility] History and Physical  [name] WUJ:[id number] DOB: [birth year] DOA: 09/19/2012  Referring physician: ER physician. PCP: Hoyle Sauer, MD   Chief Complaint: Abdominal pain.  HPI: [name] is a 77 y.o. female with history of CAD status post stenting, hypertension, OSA presents with complaints of abdominal pain. Patient started experiencing diffuse abdominal pain since afternoon today. The pain is dull aching constant associated one episode of nausea vomiting. The last time patient had a bowel movement was yesterday. Patient still has flatus. In the ER CT abdomen and pelvis shows features concerning for small bowel obstruction. On-call surgeon has been consulted and patient has been admitted for further management. Patient denies any chest pain or shortness of breath. Patient's abdominal pain is improved with IV pain medications and NG tube is about to be placed now.  Review of Systems: As presented in the history of presenting illness, rest negative.  Past Medical History  Diagnosis Date  . Sleep apnea     insomnia  . Hyperlipidemia   . Hypertension   . Anxiety   . Chronic headache   . Osteoarthritis   . Panic attacks   . CAD (coronary artery disease)   . Depression   . Chronic chest pain   . Malignant neoplasm of breast     right breast  . Constipation    Past Surgical History  Procedure Laterality Date  . Cholecystectomy    . Right mastectomy    . Tonsillectomy    . Knee surgery    . US echocardiography  10/04/2006    EF 55-60%  . Heart stent  May 2011 per pt  . Portacath placement      x 2  . Inner ear surgery     Social History:  reports that she has never smoked. She has never used smokeless tobacco. She reports that she does not drink alcohol or use illicit drugs. Home. where does patient live-- Can do ADLs. Can patient participate in ADLs?  No Known Allergies  History reviewed. No pertinent family history.    Prior to Admission  medications   Medication Sig Start Date End Date Taking? Authorizing Provider  amitriptyline (ELAVIL) 100 MG tablet Take 100 mg by mouth at bedtime.   Yes Historical Provider, MD  anastrozole (ARIMIDEX) 1 MG tablet Take 1 mg by mouth daily.   Yes Historical Provider, MD  aspirin 81 MG EC tablet Take 81 mg by mouth daily.     Yes Historical Provider, MD  atorvastatin (LIPITOR) 40 MG tablet Take 40 mg by mouth daily.   Yes Historical Provider, MD  bisacodyl (DULCOLAX) 5 MG EC tablet Take 5 mg by mouth daily as needed.   Yes Historical Provider, MD  Calcium Carbonate-Vit D-Min (CALTRATE 600+D PLUS) 600-400 MG-UNIT per tablet Take 1 tablet by mouth daily.     Yes Historical Provider, MD  LORazepam (ATIVAN) 2 MG tablet Take 2 mg by mouth every 6 (six) hours as needed for anxiety. PRN............Marland KitchenMarland KitchenAnxiety and panic attacks   Yes Historical Provider, MD  MAGNESIUM OXIDE PO Take 1 tablet by mouth daily. Pt doesn't know the dose of the medication.   Yes Historical Provider, MD  nitroGLYCERIN (NITROSTAT) 0.4 MG SL tablet Place 0.4 mg under the tongue every 5 (five) minutes as needed for chest pain.   Yes Historical Provider, MD  Omega-3 Fatty Acids (FISH OIL) 1000 MG CAPS Take 1,000 mg by mouth daily.   Yes Historical Provider, MD  quinapril (ACCUPRIL) 20  MG tablet Take 20 mg by mouth at bedtime.    Yes Historical Provider, MD   Physical Exam: Filed Vitals:   09/19/12 1252 09/19/12 1800 09/19/12 2108  BP: 157/82 123/58 118/62  Pulse: 110 98 97  Temp: 98.2 F (36.8 C)    TempSrc: Oral    Resp: 18 18 16   SpO2: 94% 97% 94%     General:  Well-developed well-nourished.  Eyes: Anicteric no pallor.  ENT: No discharge from the ears eyes nose mouth.  Neck: No mass felt.  Cardiovascular: S1-S2 heard.  Respiratory: No rhonchi or crepitations.  Abdomen: Mildly distended. Bowel sounds are appreciated. No guarding or rigidity.  Skin: No rash.  Musculoskeletal: No edema.  Psychiatric: Appears  normal.  Neurologic: Alert awake oriented to time place and person. Moves all extremities.  Labs on Admission:  Basic Metabolic Panel:  Recent Labs Lab 09/19/12 1325  NA 137  K 4.0  CL 101  CO2 22  GLUCOSE 137*  BUN 14  CREATININE 0.69  CALCIUM 10.0   Liver Function Tests:  Recent Labs Lab 09/19/12 1325  AST 19  ALT 27  ALKPHOS 276*  BILITOT 0.3  PROT 7.6  ALBUMIN 3.8    Recent Labs Lab 09/19/12 1325  LIPASE 21   No results found for this basename: AMMONIA,  in the last 168 hours CBC:  Recent Labs Lab 09/19/12 1325  WBC 14.2*  NEUTROABS 11.3*  HGB 14.4  HCT 43.8  MCV 81.6  PLT 313   Cardiac Enzymes: No results found for this basename: CKTOTAL, CKMB, CKMBINDEX, TROPONINI,  in the last 168 hours  BNP (last 3 results) No results found for this basename: PROBNP,  in the last 8760 hours CBG: No results found for this basename: GLUCAP,  in the last 168 hours  Radiological Exams on Admission: Ct Abdomen Pelvis W Contrast  09/19/2012   *RADIOLOGY REPORT*  Clinical Data: Abdomen pain.  The patient has a history of cancer.  CT ABDOMEN AND PELVIS WITH CONTRAST  Technique:  Multidetector CT imaging of the abdomen and pelvis was performed following the standard protocol during bolus administration of intravenous contrast.  Contrast: OMNIPAQUE IOHEXOL 300 MG/ML  SOLN  Comparison: August 11, 2012  Findings: The liver, spleen, pancreas, adrenal glands and right kidney are normal.  There are small cysts within the left kidney. The patient status post prior cholecystectomy.  There is atherosclerosis of the abdominal aorta without aneurysmal dilatation.   There are multiple lymph nodes within the mesentery and retroperitoneum some are enlarged compared to prior exam. The largest conglomerate of lymph node measures 3.1 cm in the central mesentery on image 29 enlarged compared prior exam.  There are multiple dilated small bowel loops in the abdomen and pelvis with transition  probably in the ileal loops.  The distal ileal loops are normal in caliber.  The colon is normal.  The appendix is normal.  Partial fluid filled bladder is normal.  There is a low density lesion in the left adnexa measuring maximum 5.2 cm not significantly changed compared prior exam.  No acute abnormality is identified in the visualized lung bases.  There are degenerative joint changes of the spine.  IMPRESSION: Findings suspicious for small bowel obstruction.  Follow-up is recommended.  Retroperitoneal and mesenteric lymphadenopathy slightly worse compared prior exam.  Clinical correlation is recommended. Lymphoma is not excluded.   Original Report Authenticated By: Sherian Rein, M.D.   Dg Abd Acute W/chest  09/19/2012   *RADIOLOGY REPORT*  Clinical Data: Generalized abdominal pain.  Nausea and vomiting. Chronic constipation.  Prior history of right mastectomy for breast cancer.  Prior cholecystectomy.  ACUTE ABDOMEN SERIES (ABDOMEN 2 VIEW & CHEST 1 VIEW)  Comparison: Unenhanced CT abdomen and pelvis 08/11/2012.  One-view chest x-ray 04/27/2012.  Findings: Multiple dilated loops of small bowel in the left upper quadrant and in the mid abdomen, demonstrating air fluid levels on the lateral decubitus image.  No evidence of free intraperitoneal air.  Gas and moderate stool burden throughout normal caliber colon from cecum to rectum.  Surgical clips in the right upper quadrant from prior cholecystectomy.  Phleboliths in the pelvis.  No visible opaque urinary tract calculi.  Degenerative changes involving the lower thoracic and lumbar spine.  Cardiac silhouette mildly enlarged for the AP technique, unchanged. Prominent paracardiac fat pad on the left accounts for the blunted costophrenic angle.  Mild linear scar at the right lung base, unchanged.  Prominent bronchovascular markings diffusely and central peribronchial thickening, unchanged, consistent with chronic bronchitis.  No new pulmonary parenchymal  abnormalities. No visible pleural effusions.  IMPRESSION:  1.  Partial small bowel obstruction.  No free intraperitoneal air. 2.  Stable mild cardiomegaly, COPD (chronic bronchitis), and scarring in the lung bases.  No acute cardiopulmonary disease.   Original Report Authenticated By: Hulan Saas, M.D.    Assessment/Plan Principal Problem:   SBO (small bowel obstruction) Active Problems:   HYPERTENSION   CAD   INSOMNIA WITH SLEEP APNEA UNSPECIFIED   1. Small bowel obstruction - patient will be kept n.p.o. Continue with NG tube suction. Gentle hydration. For recommendations per surgery. Pain relief medications. 2. CAD status post stenting - denies any chest pain. Aspirin rectally until patient can take orally. 3. Hypertension - when necessary IV hydralazine for systolic blood pressure more than 160 until patient can take oral medications. 4. History of OSA - patient is noncompliant with CPAP and uses oxygen at bedtime.    Code Status: Full code.  Family Communication: None.  Disposition Plan: Admit to inpatient.    Leily Capek N. [facility] Pager [phone].  If 7PM-7AM, please contact night-coverage [email or web address] Password [facility] 09/19/2012, 9:17 PM

## 2012-09-19 NOTE — ED Notes (Signed)
Report to [facility] RN-has reviewed chart and is ready for pt

## 2012-09-19 NOTE — ED Provider Notes (Signed)
Pt with sbo per Ct.  Seen by Surgery (see note).  Admitted to hospitalist.  Admit orders initiated.  Pt stable.  NG being placed.  Claudean Kinds, MD 09/19/12 [phone]

## 2012-09-19 NOTE — ED Notes (Signed)
MD at bedside. Hospitalist at bedside. 

## 2012-09-19 NOTE — ED Provider Notes (Signed)
CSN: [id number]     Arrival date & time 09/19/12  1240 History     First MD Initiated Contact with Patient 09/19/12 1320     Chief Complaint  Patient presents with  . Abdominal Pain  . Nausea  . Emesis   (Consider location/radiation/quality/duration/timing/severity/associated sxs/prior Treatment) Patient is a 77 y.o. female presenting with abdominal pain.  Abdominal Pain Pain location:  Periumbilical Pain quality: sharp   Pain radiates to:  Does not radiate Pain severity:  Moderate Onset quality:  Gradual Duration:  1 day Timing:  Constant Progression:  Worsening Chronicity:  New Relieved by:  Nothing Worsened by:  Movement Ineffective treatments:  None tried Associated symptoms: constipation and nausea   Associated symptoms: no chest pain, no cough, no diarrhea, no fever, no shortness of breath and no vomiting     Past Medical History  Diagnosis Date  . Sleep apnea     insomnia  . Hyperlipidemia   . Hypertension   . Anxiety   . Chronic headache   . Osteoarthritis   . Panic attacks   . CAD (coronary artery disease)   . Depression   . Chronic chest pain   . Malignant neoplasm of breast     right breast   Past Surgical History  Procedure Laterality Date  . Cholecystectomy    . Right mastectomy    . Tonsillectomy    . Knee surgery    . US echocardiography  10/04/2006    EF 55-60%   No family history on file. History  Substance Use Topics  . Smoking status: Never Smoker   . Smokeless tobacco: Not on file  . Alcohol Use: No   OB History   Grav Para Term Preterm Abortions TAB SAB Ect Mult Living                 Review of Systems  Constitutional: Negative for fever.  HENT: Negative for congestion.   Respiratory: Negative for cough and shortness of breath.   Cardiovascular: Negative for chest pain.  Gastrointestinal: Positive for nausea, abdominal pain and constipation. Negative for vomiting and diarrhea.  All other systems reviewed and are  negative.    Allergies  Review of patient's allergies indicates no known allergies.  Home Medications   Current Outpatient Rx  Name  Route  Sig  Dispense  Refill  . amitriptyline (ELAVIL) 100 MG tablet   Oral   Take 100 mg by mouth at bedtime.         Marland Kitchen anastrozole (ARIMIDEX) 1 MG tablet   Oral   Take 1 mg by mouth daily.         Marland Kitchen aspirin 81 MG EC tablet   Oral   Take 81 mg by mouth daily.           Marland Kitchen atorvastatin (LIPITOR) 40 MG tablet   Oral   Take 40 mg by mouth daily.         . bisacodyl (DULCOLAX) 5 MG EC tablet   Oral   Take 5 mg by mouth daily as needed.         . Calcium Carbonate-Vit D-Min (CALTRATE 600+D PLUS) 600-400 MG-UNIT per tablet   Oral   Take 1 tablet by mouth daily.           Marland Kitchen LORazepam (ATIVAN) 2 MG tablet   Oral   Take 2 mg by mouth every 6 (six) hours as needed for anxiety. PRN............Marland KitchenMarland KitchenAnxiety and panic attacks         .  MAGNESIUM OXIDE PO   Oral   Take 1 tablet by mouth daily. Pt doesn't know the dose of the medication.         . nitroGLYCERIN (NITROSTAT) 0.4 MG SL tablet   Sublingual   Place 0.4 mg under the tongue every 5 (five) minutes as needed for chest pain.         . Omega-3 Fatty Acids (FISH OIL) 1000 MG CAPS   Oral   Take 1,000 mg by mouth daily.         . quinapril (ACCUPRIL) 20 MG tablet   Oral   Take 20 mg by mouth at bedtime.           BP 157/82  Pulse 110  Temp(Src) 98.2 F (36.8 C) (Oral)  Resp 18  SpO2 94% Physical Exam  Nursing note and vitals reviewed. Constitutional: She is oriented to person, place, and time. She appears well-developed and well-nourished. No distress.  HENT:  Head: Normocephalic and atraumatic.  Mouth/Throat: Oropharynx is clear and moist.  Eyes: Conjunctivae are normal. Pupils are equal, round, and reactive to light. No scleral icterus.  Neck: Neck supple.  Cardiovascular: Normal rate, regular rhythm, normal heart sounds and intact distal pulses.   No murmur  heard. Pulmonary/Chest: Effort normal and breath sounds normal. No stridor. No respiratory distress. She has no rales.  Abdominal: Soft. Bowel sounds are normal. She exhibits no distension. There is no tenderness. There is no rigidity, no rebound and no guarding.  Musculoskeletal: Normal range of motion.  Neurological: She is alert and oriented to person, place, and time.  Skin: Skin is warm and dry. No rash noted.  Psychiatric: She has a normal mood and affect. Her behavior is normal.    ED Course   Procedures (including critical care time)  Labs Reviewed  CBC WITH DIFFERENTIAL - Abnormal; Notable for the following:    WBC 14.2 (*)    RBC 5.37 (*)    RDW 16.4 (*)    Neutrophils Relative % 79 (*)    Neutro Abs 11.3 (*)    Lymphocytes Relative 11 (*)    All other components within normal limits  URINALYSIS, ROUTINE W REFLEX MICROSCOPIC  COMPREHENSIVE METABOLIC PANEL    DG Abd Acute W/Chest (Final result)  Result time: 09/19/12 15:03:18    Final result by Rad Results In Interface (09/19/12 15:03:18)    Narrative:   *RADIOLOGY REPORT*  Clinical Data: Generalized abdominal pain. Nausea and vomiting. Chronic constipation. Prior history of right mastectomy for breast cancer. Prior cholecystectomy.  ACUTE ABDOMEN SERIES (ABDOMEN 2 VIEW & CHEST 1 VIEW)  Comparison: Unenhanced CT abdomen and pelvis 08/11/2012. One-view chest x-ray 04/27/2012.  Findings: Multiple dilated loops of small bowel in the left upper quadrant and in the mid abdomen, demonstrating air fluid levels on the lateral decubitus image. No evidence of free intraperitoneal air. Gas and moderate stool burden throughout normal caliber colon from cecum to rectum. Surgical clips in the right upper quadrant from prior cholecystectomy. Phleboliths in the pelvis. No visible opaque urinary tract calculi. Degenerative changes involving the lower thoracic and lumbar spine.  Cardiac silhouette mildly enlarged for the AP  technique, unchanged. Prominent paracardiac fat pad on the left accounts for the blunted costophrenic angle. Mild linear scar at the right lung base, unchanged. Prominent bronchovascular markings diffusely and central peribronchial thickening, unchanged, consistent with chronic bronchitis. No new pulmonary parenchymal abnormalities. No visible pleural effusions.  IMPRESSION:  1. Partial small bowel obstruction. No free intraperitoneal  air. 2. Stable mild cardiomegaly, COPD (chronic bronchitis), and scarring in the lung bases. No acute cardiopulmonary disease.   Original Report Authenticated By: Hulan Saas, M.D.    Above radiology studies independently viewed by me.     1. Bowel obstruction     MDM  77 yo female w hx of remote cholecystectomy presenting with generalized abdominal pain.  No distress, but mildly tachycardic.  Labs show leukocytosis.  AAS consistent with partial SBO.  Discussed with [facility] who requested CT scan.  Care transferred to Dr. Fayrene Fearing at 1600 with CT pending. IV dilaudid has helped pain.   Candyce Churn, MD 09/20/12 [phone]

## 2012-09-19 NOTE — ED Notes (Signed)
Pt states that she has been having n/v and had some emesis earlier today. Is currently asking to eat and drink something. Pt is a CA pt .

## 2012-09-20 ENCOUNTER — Inpatient Hospital Stay (HOSPITAL_COMMUNITY): Payer: Medicare Other

## 2012-09-20 LAB — COMPREHENSIVE METABOLIC PANEL
ALT: 19 U/L (ref 0–35)
Alkaline Phosphatase: 221 U/L — ABNORMAL HIGH (ref 39–117)
BUN: 13 mg/dL (ref 6–23)
CO2: 22 mEq/L (ref 19–32)
Chloride: 109 mEq/L (ref 96–112)
GFR calc Af Amer: >90 mL/min (ref >90)
Glucose, Bld: 114 mg/dL — ABNORMAL HIGH (ref 70–99)
Potassium: 4.2 mEq/L (ref 3.5–5.1)
Sodium: 139 mEq/L (ref 135–145)
Total Bilirubin: 0.4 mg/dL (ref 0.3–1.2)
Total Protein: 6 g/dL (ref 6.0–8.3)

## 2012-09-20 LAB — GLUCOSE, CAPILLARY
Glucose-Capillary: 87 mg/dL (ref 70–99)
Glucose-Capillary: 74 mg/dL (ref 70–99)

## 2012-09-20 LAB — CBC
HCT: 40 % (ref 36.0–46.0)
MCHC: 32 g/dL (ref 30.0–36.0)
Platelets: 298 10*3/uL (ref 150–400)
RDW: 16.6 % — ABNORMAL HIGH (ref 11.5–15.5)
WBC: 9.9 10*3/uL (ref 4.0–10.5)

## 2012-09-20 MED ORDER — LORAZEPAM 1 MG PO TABS
2.0000 mg | ORAL_TABLET | Freq: Four times a day (QID) | ORAL | Status: DC | PRN
  Administered 2012-09-20: 2 mg via ORAL
  Filled 2012-09-20: qty 2

## 2012-09-20 MED ORDER — HEPARIN SODIUM (PORCINE) 5000 UNIT/ML IJ SOLN
5000.0000 [IU] | Freq: Three times a day (TID) | INTRAMUSCULAR | Status: DC
  Administered 2012-09-20: 5000 [IU] via SUBCUTANEOUS
  Filled 2012-09-20 (×7): qty 1

## 2012-09-20 MED ORDER — DEXTROSE-NACL 5-0.45 % IV SOLN
INTRAVENOUS | Status: DC
  Administered 2012-09-20 – 2012-09-21 (×2): via INTRAVENOUS

## 2012-09-20 NOTE — Progress Notes (Signed)
Patient requesting ng tube be removed and diet resumed.  Dr. Sunnie Nielsen notified.  Dr. Sunnie Nielsen requested that it be approved by surgery.  Dr. Ezzard Standing notified of request.  Dr. Ezzard Standing approved ng tube removal, but requested that diet be npo until the morning.  Philomena Doheny RN

## 2012-09-20 NOTE — Progress Notes (Addendum)
Pt is refusing for her CBG to be checked anymore through the night since we are not allowing her to eat.  Explained importance to patient that since she is not eating that increases her chances of becoming hyopglycemic.  Patient stated she didn't care we were not checking her sugar anymore.  Last CBG was 99 at 2200 and fluids were changed to D51/2NS.  Will continue to monitor for signs of hypoglycemic. Fatima Sanger A

## 2012-09-20 NOTE — Progress Notes (Signed)
Occupational Therapy Evaluation Patient Details Name: [name] MRN: [id number] DOB: [birth year] Today's Date: 09/20/2012 Time: 4098-1191 OT Time Calculation (min): 34 min  OT Assessment / Plan / Recommendation History of present illness   77 yo presents with an abdominal pain she describes as being over her mid upper abdomen. She said it was more like a numbness. She said it was followed by some nausea and she vomited once. She says that was due to her supper last PM, a Pot pie she cooked and felt something was wrong with it, but ate it anyway. Pt with questionable SBO.    Clinical Impression   PTA, this interesting pt lived alone and had a friend help assist with errands. Pt states she has HA which are caused by people trying to pick her locks. Pt states that she was independent with ADL @ w/c level, however, pt with unkempt appearance, refused to remove her socks as she "has not washed her feet in sometime" and   w/c smells of urine.  Pt currently requires min A with mobility and ADL. Pt states that she is returning home.  Recommend confirming that pt will have adequate home care through social services to safely D/C home (pt states she will have 8 hrs/wk). Pt would also benefit from HHOT/PT, but wants to be assured that her insurance will cover these services. Pt will benefit from skilled OT services to facilitate D/C to home due to below deficits. If not already involved, rec adult protective services.    OT Assessment  Patient needs continued OT Services    Follow Up Recommendations  Home health OT    Barriers to Discharge Decreased caregiver support Ability for pt to safely care for herself  Equipment Recommendations  Other (comment) (AE)    Recommendations for Other Services    Frequency  Min 3X/week    Precautions / Restrictions Precautions Precautions: Fall Precaution Comments: B knee flexorcontractures @ 30 degrees Restrictions Weight Bearing Restrictions: No   Pertinent  Vitals/Pain no apparent distress     ADL  Eating/Feeding: Independent Where Assessed - Eating/Feeding: Chair Grooming: Set up;Supervision/safety Where Assessed - Grooming: Unsupported sitting Upper Body Bathing: Supervision/safety;Set up Where Assessed - Upper Body Bathing: Unsupported sitting Lower Body Bathing: Moderate assistance Where Assessed - Lower Body Bathing: Lean right and/or left;Supported sitting Upper Body Dressing: Supervision/safety;Set up Where Assessed - Upper Body Dressing: Unsupported sitting Lower Body Dressing: Moderate assistance Where Assessed - Lower Body Dressing: Lean right and/or left;Supported sitting Toilet Transfer: Minimal assistance Toilet Transfer Method: Squat pivot Toilet Transfer Equipment: Bedside commode Toileting - Clothing Manipulation and Hygiene: Minimal assistance Where Assessed - Engineer, mining and Hygiene: Lean right and/or left Equipment Used: Gait belt Transfers/Ambulation Related to ADLs: min A squat pivot ADL Comments: Pt staes that she will have a PCA 2x/week after D/C    OT Diagnosis: Generalized weakness;Other (comment) (questionable MS)  OT Problem List: Decreased strength;Decreased range of motion;Decreased activity tolerance;Impaired balance (sitting and/or standing);Decreased safety awareness;Decreased knowledge of use of DME or AE;Decreased knowledge of precautions;Obesity OT Treatment Interventions: Self-care/ADL training;Therapeutic exercise;Energy conservation;DME and/or AE instruction;Therapeutic activities;Patient/family education;Balance training   OT Goals(Current goals can be found in the care plan section) Acute Rehab OT Goals Patient Stated Goal: to go hme OT Goal Formulation: With patient Time For Goal Achievement: 10/04/12 Potential to Achieve Goals: Good  Visit Information  Last OT Received On: 09/20/12 Assistance Needed: +1 PT/OT Co-Evaluation/Treatment: Yes       Prior  Functioning  Home Living Family/patient expects to be discharged to:: Private residence Living Arrangements: Alone Available Help at Discharge: Available PRN/intermittently;Neighbor Special educational needs teacher the neighbor assists with taking her to [facility], will have PCA 8 hrs/wk per pt. Also has a SW involved. ) Type of Home: Apartment Home Access: Level entry Home Layout: One level Home Equipment: Walker - 2 wheels;Walker - 4 wheels;Bedside commode;Wheelchair - manual Prior Function Level of Independence: Independent with assistive device(s) Comments: question how pt was able to care for herself adequately Communication Communication: No difficulties Dominant Hand: Right         Vision/Perception     Cognition  Cognition Arousal/Alertness: Awake/alert Behavior During Therapy: Anxious Overall Cognitive Status: No family/caregiver present to determine baseline cognitive functioning     Extremity/Trunk Assessment Upper Extremity Assessment Upper Extremity Assessment: Overall WFL for tasks assessed Lower Extremity Assessment Lower Extremity Assessment: Defer to PT evaluation (B knee flexor contractures)     Mobility Bed Mobility Bed Mobility: Supine to Sit;Sitting - Scoot to Edge of Bed Supine to Sit: 6: Modified independent (Device/Increase time) Sitting - Scoot to Edge of Bed: 6: Modified independent (Device/Increase time) Transfers Transfers: Sit to Stand;Stand to Sit Sit to Stand: 4: Min assist Stand to Sit: 4: Min assist Details for Transfer Assistance: squat pivot transfer     Exercise     Balance Balance Balance Assessed:  (poor postural control during trasnfers - uncontrolled descen)   End of Session OT - End of Session Equipment Utilized During Treatment: Gait belt Activity Tolerance: Patient tolerated treatment well Patient left: in chair;with call bell/phone within reach Nurse Communication: Mobility status  GO     [name],[name] 09/20/2012, 3:11 PM [facility],  OTR/L  [phone] 09/20/2012

## 2012-09-20 NOTE — Progress Notes (Signed)
[facility] PROGRESS NOTE  [name] ZOX:[id number] DOB: Feb 13, 1936 DOA: 09/19/2012 PCP: Hoyle Sauer, MD  Assessment/Plan: 1-Small bowel obstruction; CT abdomen/ pelvis: Findings suspicious for small bowel obstruction. Repeat KUB today: Some improvement in partial small bowel obstruction. Continue with NG tube. Surgery following.   2-Retroperitoneal and mesenteric lymphadenopathy slightly worse  compared prior exam. Needs follow up outpatient.   3-CAD status post stenting; Continue with aspirin.  4-Hypertension: Hydralazine PRN.  5-History of OSA - patient is noncompliant with CPAP and uses oxygen at bedtime 6-left adnexa measuring maximum 5.2 cm not  significantly changed compared prior exam. Needs to follow up with OBY.     Code Status: Full Code.  Family Communication: Care discussed with patient.  Disposition Plan: Remain in patient.    Consultants:  Dr Ezzard Standing.   Procedures:  none  Antibiotics:  none  HPI/Subjective: Oriented to person and place. She is wondering when she will be able to eat. No abdominal pain. She has not pass flatus. She has NG tube in place.   Objective: Filed Vitals:   09/20/12 1415  BP: 131/79  Pulse: 90  Temp: 97.3 F (36.3 C)  Resp: 18    Intake/Output Summary (Last 24 hours) at 09/20/12 1419 Last data filed at 09/20/12 8119  Gross per 24 hour  Intake      0 ml  Output    602 ml  Net   -602 ml   There were no vitals filed for this visit.  Exam:   General:  No distress.   Cardiovascular: S 1, S 2 RRR  Respiratory: CTA  Abdomen: Bs present, soft, NT  Musculoskeletal: no edema.   Data Reviewed: Basic Metabolic Panel:  Recent Labs Lab 09/19/12 1325 09/20/12 0451  NA 137 139  K 4.0 4.2  CL 101 109  CO2 22 22  GLUCOSE 137* 114*  BUN 14 13  CREATININE 0.69 0.68  CALCIUM 10.0 8.5   Liver Function Tests:  Recent Labs Lab 09/19/12 1325 09/20/12 0451  AST 19 14  ALT 27 19  ALKPHOS 276* 221*   BILITOT 0.3 0.4  PROT 7.6 6.0  ALBUMIN 3.8 2.8*    Recent Labs Lab 09/19/12 1325  LIPASE 21   No results found for this basename: AMMONIA,  in the last 168 hours CBC:  Recent Labs Lab 09/19/12 1325 09/20/12 0451  WBC 14.2* 9.9  NEUTROABS 11.3*  --   HGB 14.4 12.8  HCT 43.8 40.0  MCV 81.6 83.0  PLT 313 298   Cardiac Enzymes: No results found for this basename: CKTOTAL, CKMB, CKMBINDEX, TROPONINI,  in the last 168 hours BNP (last 3 results) No results found for this basename: PROBNP,  in the last 8760 hours CBG:  Recent Labs Lab 09/19/12 2344 09/20/12 0621 09/20/12 1155  GLUCAP 162* 87 86    No results found for this or any previous visit (from the past 240 hour(s)).   Studies: Ct Abdomen Pelvis W Contrast  09/19/2012   *RADIOLOGY REPORT*  Clinical Data: Abdomen pain.  The patient has a history of cancer.  CT ABDOMEN AND PELVIS WITH CONTRAST  Technique:  Multidetector CT imaging of the abdomen and pelvis was performed following the standard protocol during bolus administration of intravenous contrast.  Contrast: OMNIPAQUE IOHEXOL 300 MG/ML  SOLN  Comparison: August 11, 2012  Findings: The liver, spleen, pancreas, adrenal glands and right kidney are normal.  There are small cysts within the left kidney. The patient status post prior cholecystectomy.  There is atherosclerosis of the abdominal aorta without aneurysmal dilatation.   There are multiple lymph nodes within the mesentery and retroperitoneum some are enlarged compared to prior exam. The largest conglomerate of lymph node measures 3.1 cm in the central mesentery on image 29 enlarged compared prior exam.  There are multiple dilated small bowel loops in the abdomen and pelvis with transition probably in the ileal loops.  The distal ileal loops are normal in caliber.  The colon is normal.  The appendix is normal.  Partial fluid filled bladder is normal.  There is a low density lesion in the left adnexa measuring  maximum 5.2 cm not significantly changed compared prior exam.  No acute abnormality is identified in the visualized lung bases.  There are degenerative joint changes of the spine.  IMPRESSION: Findings suspicious for small bowel obstruction.  Follow-up is recommended.  Retroperitoneal and mesenteric lymphadenopathy slightly worse compared prior exam.  Clinical correlation is recommended. Lymphoma is not excluded.   Original Report Authenticated By: Sherian Rein, M.D.   Dg Abd 2 Views  09/20/2012   *RADIOLOGY REPORT*  Clinical Data: Follow up of small bowel obstruction  ABDOMEN - 2 VIEW  Comparison: CT abdomen pelvis of 09/19/2012 and chest with acute abdomen of the same date  Findings: The lungs are not as well aerated with increase in bibasilar atelectasis.  Cardiomegaly is stable.  An NG tube extends into the stomach.  With NG tube present, there has been some interval improvement in the degree of small bowel gaseous distention.  Colonic bowel gas is demonstrated to the rectum.  No free air is seen on the erect view.  IMPRESSION:  1.  Some improvement in partial small bowel obstruction with NG tube present with the tip in the antrum of the stomach. 2.  No free air. 3.  Diminished aeration with increase in bibasilar atelectasis.   Original Report Authenticated By: Dwyane Dee, M.D.   Dg Abd Acute W/chest  09/19/2012   *RADIOLOGY REPORT*  Clinical Data: Generalized abdominal pain.  Nausea and vomiting. Chronic constipation.  Prior history of right mastectomy for breast cancer.  Prior cholecystectomy.  ACUTE ABDOMEN SERIES (ABDOMEN 2 VIEW & CHEST 1 VIEW)  Comparison: Unenhanced CT abdomen and pelvis 08/11/2012.  One-view chest x-ray 04/27/2012.  Findings: Multiple dilated loops of small bowel in the left upper quadrant and in the mid abdomen, demonstrating air fluid levels on the lateral decubitus image.  No evidence of free intraperitoneal air.  Gas and moderate stool burden throughout normal caliber colon from  cecum to rectum.  Surgical clips in the right upper quadrant from prior cholecystectomy.  Phleboliths in the pelvis.  No visible opaque urinary tract calculi.  Degenerative changes involving the lower thoracic and lumbar spine.  Cardiac silhouette mildly enlarged for the AP technique, unchanged. Prominent paracardiac fat pad on the left accounts for the blunted costophrenic angle.  Mild linear scar at the right lung base, unchanged.  Prominent bronchovascular markings diffusely and central peribronchial thickening, unchanged, consistent with chronic bronchitis.  No new pulmonary parenchymal abnormalities. No visible pleural effusions.  IMPRESSION:  1.  Partial small bowel obstruction.  No free intraperitoneal air. 2.  Stable mild cardiomegaly, COPD (chronic bronchitis), and scarring in the lung bases.  No acute cardiopulmonary disease.   Original Report Authenticated By: Hulan Saas, M.D.    Scheduled Meds: . aspirin  300 mg Rectal Daily  . heparin subcutaneous  5,000 Units Subcutaneous Q8H   Continuous Infusions: . sodium chloride  100 mL/hr at 09/20/12 0340    Principal Problem:   SBO (small bowel obstruction) Active Problems:   HYPERTENSION   CAD   INSOMNIA WITH SLEEP APNEA UNSPECIFIED    Time spent: 35 minutes.     REGALADO,BELKYS  [facility] Pager [phone]. If 7PM-7AM, please contact night-coverage at [email or web address], password [email or web address] 09/20/2012, 2:19 PM  LOS: 1 day

## 2012-09-20 NOTE — Evaluation (Addendum)
Physical Therapy Evaluation Patient Details Name: [name] MRN: [id number] DOB: [birth year] Today's Date: 09/20/2012 Time: 4098-1191 PT Time Calculation (min): 27 min  PT Assessment / Plan / Recommendation History of Present Illness  77 y.o. female with history of CAD status post stenting, hypertension, OSA presents with complaints of abdominal pain. Patient started experiencing diffuse abdominal pain since afternoon today. The pain is dull aching constant associated one episode of nausea vomiting. The last time patient had a bowel movement was yesterday. Patient still has flatus. In the ER CT abdomen and pelvis shows features concerning for small bowel obstruction. On-call surgeon has been consulted and patient has been admitted for further management. Patient denies any chest pain or shortness of breath. Patient's abdominal pain is improved with IV pain medications and NG tube is about to be placed now.  Clinical Impression  On eval, pt required Min assist for squat pivot to recliner. Pt reports some weakness and decreased activity tolerance. Pt states she was Mod I with mobility prior to admission. Pt was hesitant to agree to HHPT at time of eval (concerned about cost/insurance coverage). If does not agree to HHPT, pt will need to have a home safety evaluation at least.     PT Assessment  Patient needs continued PT services    Follow Up Recommendations  Home health PT (if pt agreeable. At least a home safety evaluation)    Does the patient have the potential to tolerate intense rehabilitation      Barriers to Discharge        Equipment Recommendations  None recommended by PT    Recommendations for Other Services OT consult   Frequency Min 3X/week    Precautions / Restrictions Precautions Precautions: Fall Precaution Comments: B knee flexorcontractures @ 30 degrees Restrictions Weight Bearing Restrictions: No   Pertinent Vitals/Pain No c/o pain      Mobility  Bed  Mobility Bed Mobility: Supine to Sit;Sitting - Scoot to Edge of Bed Supine to Sit: 6: Modified independent (Device/Increase time) Sitting - Scoot to Edge of Bed: 6: Modified independent (Device/Increase time) Transfers Transfers: Scientist, clinical (histocompatibility and immunogenetics) Transfers Sit to Stand: 4: Min assist Stand to Sit: 4: Min Designer, television/film set Transfers: 4: Min assist Details for Transfer Assistance: squat pivot transfer towards R side using armrests of recliner. Pt prefers to mobilize in her own manner.  Ambulation/Gait Ambulation/Gait Assistance: Not tested (comment) Ambulation/Gait Assistance Details: pt non-ambulatory    Exercises     PT Diagnosis: Generalized weakness  PT Problem List: Decreased mobility;Decreased activity tolerance;Obesity PT Treatment Interventions: Functional mobility training;Therapeutic activities;Therapeutic exercise;Patient/family education     PT Goals(Current goals can be found in the care plan section) Acute Rehab PT Goals Patient Stated Goal: home PT Goal Formulation: With patient Time For Goal Achievement: 10/04/12 Potential to Achieve Goals: Fair  Visit Information  Last PT Received On: 09/20/12 Assistance Needed: +1 History of Present Illness: 77 y.o. female with history of CAD status post stenting, hypertension, OSA presents with complaints of abdominal pain. Patient started experiencing diffuse abdominal pain since afternoon today. The pain is dull aching constant associated one episode of nausea vomiting. The last time patient had a bowel movement was yesterday. Patient still has flatus. In the ER CT abdomen and pelvis shows features concerning for small bowel obstruction. On-call surgeon has been consulted and patient has been admitted for further management. Patient denies any chest pain or shortness of breath. Patient's abdominal pain is improved with IV pain medications and NG tube  is about to be placed now.       Prior Functioning  Home Living Family/patient  expects to be discharged to:: Private residence Living Arrangements: Alone Available Help at Discharge: Available PRN/intermittently;Neighbor Special educational needs teacher the neighbor assists with taking her to [facility], will hav) Type of Home: Apartment Home Access: Level entry Home Layout: One level Home Equipment: Walker - 2 wheels;Walker - 4 wheels;Bedside commode;Wheelchair - manual Prior Function Level of Independence: Independent with assistive device(s) Comments: question how pt was able to care for herself adequately Communication Communication: No difficulties Dominant Hand: Right    Cognition  Cognition Arousal/Alertness: Awake/alert Behavior During Therapy: Anxious Overall Cognitive Status: No family/caregiver present to determine baseline cognitive functioning    Extremity/Trunk Assessment Upper Extremity Assessment Upper Extremity Assessment: Overall WFL for tasks assessed Lower Extremity Assessment Lower Extremity Assessment: RLE deficits/detail;LLE deficits/detail RLE Deficits / Details: knee flexion contracture ~40 degree LLE Deficits / Details: knee flexion contracture ~40 degree Cervical / Trunk Assessment Cervical / Trunk Assessment: Kyphotic   Balance Balance Balance Assessed:  (poor postural control during trasnfers - uncontrolled descen)  End of Session PT - End of Session Activity Tolerance: Patient tolerated treatment well Patient left: in chair;with call bell/phone within reach Nurse Communication: Mobility status  GP     Rebeca Alert, MPT Pager: [phone]

## 2012-09-20 NOTE — Progress Notes (Signed)
COURTESY NOTE:   77 year old [place] woman formerly followed by Dr Arline Asp for a T3 N1, stage IIIA invasive LOBULAR breast cancer s/p Right MRM November 2002, treated adjuvantly with [facility] chemotherapy x 6 cycles completed May 2003, at which point she started anastrozole.   I am concerned that the mesenteric adenopathy may represent metastaitc disease. I am going to obtain tumor markers in AM and will make a follow up appointment for this patient in September (instead of next February as currently scheduled) with a repeat Ct for comparison. Otherwise may discharge home at your discretion.    Appreciate your help to this patient!  GM

## 2012-09-20 NOTE — Progress Notes (Signed)
General Surgery Note  LOS: 1 day  Room - 1508 PCP - Dr. Sondra Come  Assessment/Plan: 1.  Question SBO  Only prior abdominal surgery is lap chole.  Doing better this AM.  Will get KUB x 2.  Has NGT for now.  2.  Retroperitoneal and mesenteric lymphadenopathy  lymphadenopathy - question significance  Last CT scan - 08/11/2012 3.  DVT prophylaxis - will start SQ heparin 4.  History of CAD  Stent 5.  HTN 6.  OSA 7.  Left adnexal mass - 5.2 cm 8.  Limited physical activity - she uses a wheel chair at home.  Will get PT/OT to see  Subjective:  Feels better with less abdominal pain.  Had BM last night. Objective:   Filed Vitals:   09/20/12 0458  BP: 134/61  Pulse: 83  Temp: 98.1 F (36.7 C)  Resp: 16     Intake/Output from previous day:  08/05 0701 - 08/06 0700 In: -  Out: 601 [Emesis/NG output:600; Stool:1]  Intake/Output this shift:      Physical Exam:   General: Older obese WF who is alert and oriented.    HEENT: Normal. Pupils equal.  Has NGT in place.  About 600 cc out since placed. .   Lungs: Clear   Abdomen: Softer, few BS.   Lab Results:    Recent Labs  09/19/12 1325 09/20/12 0451  WBC 14.2* 9.9  HGB 14.4 12.8  HCT 43.8 40.0  PLT 313 298    BMET   Recent Labs  09/19/12 1325 09/20/12 0451  NA 137 139  K 4.0 4.2  CL 101 109  CO2 22 22  GLUCOSE 137* 114*  BUN 14 13  CREATININE 0.69 0.68  CALCIUM 10.0 8.5    PT/INR  No results found for this basename: LABPROT, INR,  in the last 72 hours  ABG  No results found for this basename: PHART, PCO2, PO2, HCO3,  in the last 72 hours   Studies/Results:  Ct Abdomen Pelvis W Contrast  09/19/2012   *RADIOLOGY REPORT*  Clinical Data: Abdomen pain.  The patient has a history of cancer.  CT ABDOMEN AND PELVIS WITH CONTRAST  Technique:  Multidetector CT imaging of the abdomen and pelvis was performed following the standard protocol during bolus administration of intravenous contrast.  Contrast:  OMNIPAQUE IOHEXOL 300 MG/ML  SOLN  Comparison: August 11, 2012  Findings: The liver, spleen, pancreas, adrenal glands and right kidney are normal.  There are small cysts within the left kidney. The patient status post prior cholecystectomy.  There is atherosclerosis of the abdominal aorta without aneurysmal dilatation.   There are multiple lymph nodes within the mesentery and retroperitoneum some are enlarged compared to prior exam. The largest conglomerate of lymph node measures 3.1 cm in the central mesentery on image 29 enlarged compared prior exam.  There are multiple dilated small bowel loops in the abdomen and pelvis with transition probably in the ileal loops.  The distal ileal loops are normal in caliber.  The colon is normal.  The appendix is normal.  Partial fluid filled bladder is normal.  There is a low density lesion in the left adnexa measuring maximum 5.2 cm not significantly changed compared prior exam.  No acute abnormality is identified in the visualized lung bases.  There are degenerative joint changes of the spine.  IMPRESSION: Findings suspicious for small bowel obstruction.  Follow-up is recommended.  Retroperitoneal and mesenteric lymphadenopathy slightly worse compared prior exam.  Clinical  correlation is recommended. Lymphoma is not excluded.   Original Report Authenticated By: Sherian Rein, M.D.   Dg Abd Acute W/chest  09/19/2012   *RADIOLOGY REPORT*  Clinical Data: Generalized abdominal pain.  Nausea and vomiting. Chronic constipation.  Prior history of right mastectomy for breast cancer.  Prior cholecystectomy.  ACUTE ABDOMEN SERIES (ABDOMEN 2 VIEW & CHEST 1 VIEW)  Comparison: Unenhanced CT abdomen and pelvis 08/11/2012.  One-view chest x-ray 04/27/2012.  Findings: Multiple dilated loops of small bowel in the left upper quadrant and in the mid abdomen, demonstrating air fluid levels on the lateral decubitus image.  No evidence of free intraperitoneal air.  Gas and moderate stool burden  throughout normal caliber colon from cecum to rectum.  Surgical clips in the right upper quadrant from prior cholecystectomy.  Phleboliths in the pelvis.  No visible opaque urinary tract calculi.  Degenerative changes involving the lower thoracic and lumbar spine.  Cardiac silhouette mildly enlarged for the AP technique, unchanged. Prominent paracardiac fat pad on the left accounts for the blunted costophrenic angle.  Mild linear scar at the right lung base, unchanged.  Prominent bronchovascular markings diffusely and central peribronchial thickening, unchanged, consistent with chronic bronchitis.  No new pulmonary parenchymal abnormalities. No visible pleural effusions.  IMPRESSION:  1.  Partial small bowel obstruction.  No free intraperitoneal air. 2.  Stable mild cardiomegaly, COPD (chronic bronchitis), and scarring in the lung bases.  No acute cardiopulmonary disease.   Original Report Authenticated By: Hulan Saas, M.D.     Anti-infectives:   Anti-infectives   None      Ovidio Kin, MD, FACS Pager: [phone],   [facility] Surgery Office: [phone] 09/20/2012

## 2012-09-20 NOTE — Progress Notes (Signed)
Patient had extremely large bowel movement.  Philomena Doheny RN

## 2012-09-21 ENCOUNTER — Telehealth: Payer: Self-pay | Admitting: *Deleted

## 2012-09-21 ENCOUNTER — Other Ambulatory Visit: Payer: Self-pay | Admitting: Oncology

## 2012-09-21 DIAGNOSIS — C50919 Malignant neoplasm of unspecified site of unspecified female breast: Secondary | ICD-10-CM

## 2012-09-21 LAB — CBC
HCT: 37.1 % (ref 36.0–46.0)
MCH: 26.5 pg (ref 26.0–34.0)
MCHC: 32.1 g/dL (ref 30.0–36.0)
MCV: 82.6 fL (ref 78.0–100.0)
Platelets: 275 10*3/uL (ref 150–400)
RDW: 16.4 % — ABNORMAL HIGH (ref 11.5–15.5)
WBC: 6.9 10*3/uL (ref 4.0–10.5)

## 2012-09-21 LAB — BASIC METABOLIC PANEL
BUN: 9 mg/dL (ref 6–23)
Calcium: 9 mg/dL (ref 8.4–10.5)
Creatinine, Ser: 0.7 mg/dL (ref 0.50–1.10)
GFR calc Af Amer: >90 mL/min (ref >90)
GFR calc non Af Amer: 83 mL/min — ABNORMAL LOW (ref >90)

## 2012-09-21 LAB — CA 125: CA 125: 9.2 U/mL (ref 0.0–30.2)

## 2012-09-21 LAB — GLUCOSE, CAPILLARY: Glucose-Capillary: 128 mg/dL — ABNORMAL HIGH (ref 70–99)

## 2012-09-21 MED ORDER — ASPIRIN 81 MG PO CHEW: 81.0000 mg | CHEWABLE_TABLET | Freq: Every day | ORAL | Status: DC

## 2012-09-21 NOTE — Progress Notes (Signed)
COURTESY: I drew CA 27-29 and CA 125 this AM; patient will call for results next week.   I cancelled her labs 8/19 and scheduled her to see [place] ealy NOV, with repeat ct of the abd before visit. If there has been further progression of mesenteric nodes will biopsy.  Will sign off at this point. Please let me know if we can be of further help.

## 2012-09-21 NOTE — Progress Notes (Signed)
Pt ready for d/c. This RN went over d/c instructions with pt. Instructed to follow-up with Dr. Darnelle Catalan at the end of October. PIV removed, WNL.  No change in pt condition since AM assessment. Pt d/c'd to home via friend. Eugene Garnet RN

## 2012-09-21 NOTE — Progress Notes (Addendum)
Patient ID: [name], female   DOB: 09-Nov-1935, 77 y.o.   MRN: [id number] General Surgery Note  LOS: 2 days  Room - 1508 PCP - Dr. Sondra Come  Assessment/Plan: 1.  Question SBO  Only prior abdominal surgery is lap chole.  Doing better this AM - had BM last night, NGT out.  +BM.  Clear liquids and advance as tolerated  Pt very irritated and is threatening leaving AMA  2.  Retroperitoneal and mesenteric lymphadenopathy  lymphadenopathy - question significance  Last CT scan - 08/11/2012  Seen by Dr. Darnelle Catalan. 3.  DVT prophylaxis - on SQ heparin 4.  History of CAD  Stent 5.  HTN 6.  OSA 7.  Left adnexal mass - 5.2 cm 8.  Limited physical activity - she uses a wheel chair at home.  PT/OT seeing  Subjective:  Feels fine, states she is going to leave "whether we like it or not", told her I would give her clear liquids and she told me to forget my clear liquids, she was leaving and would follow up with her GI doctor as an outpatient.  Objective:   Filed Vitals:   09/21/12 0600  BP: 125/53  Pulse: 100  Temp: 98.4 F (36.9 C)  Resp: 18     Intake/Output from previous day:  08/06 0701 - 08/07 0700 In: 2251.7 [I.V.:2251.7] Out: 101 [Urine:1; Emesis/NG output:100]  Intake/Output this shift:      Physical Exam:   General: Older obese WF who is alert and oriented, irritated    Would not allow me to exam her   Lab Results:     Recent Labs  09/20/12 0451 09/21/12 0455  WBC 9.9 6.9  HGB 12.8 11.9*  HCT 40.0 37.1  PLT 298 275    BMET    Recent Labs  09/20/12 0451 09/21/12 0455  NA 139 138  K 4.2 3.9  CL 109 107  CO2 22 23  GLUCOSE 114* 132*  BUN 13 9  CREATININE 0.68 0.70  CALCIUM 8.5 9.0    PT/INR  No results found for this basename: LABPROT, INR,  in the last 72 hours  ABG  No results found for this basename: PHART, PCO2, PO2, HCO3,  in the last 72 hours   Studies/Results:  Ct Abdomen Pelvis W Contrast  09/19/2012   *RADIOLOGY REPORT*  Clinical  Data: Abdomen pain.  The patient has a history of cancer.  CT ABDOMEN AND PELVIS WITH CONTRAST  Technique:  Multidetector CT imaging of the abdomen and pelvis was performed following the standard protocol during bolus administration of intravenous contrast.  Contrast: OMNIPAQUE IOHEXOL 300 MG/ML  SOLN  Comparison: August 11, 2012  Findings: The liver, spleen, pancreas, adrenal glands and right kidney are normal.  There are small cysts within the left kidney. The patient status post prior cholecystectomy.  There is atherosclerosis of the abdominal aorta without aneurysmal dilatation.   There are multiple lymph nodes within the mesentery and retroperitoneum some are enlarged compared to prior exam. The largest conglomerate of lymph node measures 3.1 cm in the central mesentery on image 29 enlarged compared prior exam.  There are multiple dilated small bowel loops in the abdomen and pelvis with transition probably in the ileal loops.  The distal ileal loops are normal in caliber.  The colon is normal.  The appendix is normal.  Partial fluid filled bladder is normal.  There is a low density lesion in the left adnexa measuring maximum 5.2 cm not  significantly changed compared prior exam.  No acute abnormality is identified in the visualized lung bases.  There are degenerative joint changes of the spine.  IMPRESSION: Findings suspicious for small bowel obstruction.  Follow-up is recommended.  Retroperitoneal and mesenteric lymphadenopathy slightly worse compared prior exam.  Clinical correlation is recommended. Lymphoma is not excluded.   Original Report Authenticated By: Sherian Rein, M.D.   Dg Abd 2 Views  09/20/2012   *RADIOLOGY REPORT*  Clinical Data: Follow up of small bowel obstruction  ABDOMEN - 2 VIEW  Comparison: CT abdomen pelvis of 09/19/2012 and chest with acute abdomen of the same date  Findings: The lungs are not as well aerated with increase in bibasilar atelectasis.  Cardiomegaly is stable.  An NG  tube extends into the stomach.  With NG tube present, there has been some interval improvement in the degree of small bowel gaseous distention.  Colonic bowel gas is demonstrated to the rectum.  No free air is seen on the erect view.  IMPRESSION:  1.  Some improvement in partial small bowel obstruction with NG tube present with the tip in the antrum of the stomach. 2.  No free air. 3.  Diminished aeration with increase in bibasilar atelectasis.   Original Report Authenticated By: Dwyane Dee, M.D.   Dg Abd Acute W/chest  09/19/2012   *RADIOLOGY REPORT*  Clinical Data: Generalized abdominal pain.  Nausea and vomiting. Chronic constipation.  Prior history of right mastectomy for breast cancer.  Prior cholecystectomy.  ACUTE ABDOMEN SERIES (ABDOMEN 2 VIEW & CHEST 1 VIEW)  Comparison: Unenhanced CT abdomen and pelvis 08/11/2012.  One-view chest x-ray 04/27/2012.  Findings: Multiple dilated loops of small bowel in the left upper quadrant and in the mid abdomen, demonstrating air fluid levels on the lateral decubitus image.  No evidence of free intraperitoneal air.  Gas and moderate stool burden throughout normal caliber colon from cecum to rectum.  Surgical clips in the right upper quadrant from prior cholecystectomy.  Phleboliths in the pelvis.  No visible opaque urinary tract calculi.  Degenerative changes involving the lower thoracic and lumbar spine.  Cardiac silhouette mildly enlarged for the AP technique, unchanged. Prominent paracardiac fat pad on the left accounts for the blunted costophrenic angle.  Mild linear scar at the right lung base, unchanged.  Prominent bronchovascular markings diffusely and central peribronchial thickening, unchanged, consistent with chronic bronchitis.  No new pulmonary parenchymal abnormalities. No visible pleural effusions.  IMPRESSION:  1.  Partial small bowel obstruction.  No free intraperitoneal air. 2.  Stable mild cardiomegaly, COPD (chronic bronchitis), and scarring in the  lung bases.  No acute cardiopulmonary disease.   Original Report Authenticated By: Hulan Saas, M.D.     Anti-infectives:   Anti-infectives   None      WHITE, [facility] [place] [facility] Office: [phone] 09/21/2012  Agree with above. The patient is doing better and wants to go home.  It is okay from our standpoint for her to go home. There is no reason for follow up with [place], unless there is a surgical issue to address. It sounds like she is going to see Dr. Darnelle Catalan back for the mesenteric lymphadenopathy.  Ovidio Kin, MD, [facility] Surgery Pager: [phone] Office phone:  [phone]

## 2012-09-21 NOTE — Telephone Encounter (Signed)
Tired to call the pt and give appt for labs on 10.30.14 @ 11:45am, and for an ov on 12/21/12@ 10am. i will mail a letter/avs to advise her...td

## 2012-09-21 NOTE — Discharge Summary (Signed)
Physician Discharge Summary  [name] MVH:[id number] DOB: [birth year] DOA: 09/19/2012  PCP: Hoyle Sauer, MD  Admit date: 09/19/2012 Discharge date: 09/21/2012  Time spent: 35 minutes  Recommendations for Outpatient Follow-up:  1. Need follow up with Dr Darnelle Catalan.   Discharge Diagnoses:    SBO    HYPERTENSION   CAD   INSOMNIA WITH SLEEP APNEA UNSPECIFIED   Mediastinal Lymphadenopathy.    Leftdnexa measuring maximum 5.2 cm  Discharge Condition: stable.   Diet recommendation: heart healthy  Filed Weights   09/20/12 2258  Weight: 81.647 kg (180 lb)    History of present illness:  [name] is a 77 y.o. female with history of CAD status post stenting, hypertension, OSA presents with complaints of abdominal pain. Patient started experiencing diffuse abdominal pain since afternoon today. The pain is dull aching constant associated one episode of nausea vomiting. The last time patient had a bowel movement was yesterday. Patient still has flatus. In the ER CT abdomen and pelvis shows features concerning for small bowel obstruction. On-call surgeon has been consulted and patient has been admitted for further management. Patient denies any chest pain or shortness of breath. Patient's abdominal pain is improved with IV pain medications and NG tube is about to be placed now.   Hospital Course:  1-Small bowel obstruction; CT abdomen/ pelvis: Findings suspicious for small bowel obstruction. Repeat KUB today: Some improvement in partial small bowel obstruction.  NG tube removed, per patient request. Patient had BM. Tolerating clears. She refuse full liquid, wants regular diet. Surgery following. Patient understand risk of going home today, condition could get worse.   2-Retroperitoneal and mesenteric lymphadenopathy slightly worse  compared prior exam. Needs follow up outpatient with Dr Darnelle Catalan. Tumor market ordered.   3-CAD status post stenting; Continue with aspirin.  4-Hypertension:  Hydralazine PRN.  5-History of OSA - patient is noncompliant with CPAP and uses oxygen at bedtime  6-left adnexa measuring maximum 5.2 cm not  significantly changed compared prior exam. Needs to follow up with DR Magrinat.    Procedures:  none  Consultations:  Dr Ezzard Standing  Discharge Exam: Filed Vitals:   09/21/12 0600  BP: 125/53  Pulse: 100  Temp: 98.4 F (36.9 C)  Resp: 18    General: No distress.  Cardiovascular: S 1, S 2 RRR Respiratory: CTA  Discharge Instructions  Discharge Orders   Future Appointments Provider Department Dept Phone   12/14/2012 11:45 AM Krista Blue [facility] MEDICAL ONCOLOGY [phone]   12/14/2012 1:00 PM [facility] [phone]   Patient to arrive 15 minutes prior to appointment time. Patient to pick up oral contrast at least 1 day prior to exam, unless otherwise instructed by your physician. No solid food 4 hours prior to exam. Liquids and Medicines are okay.   12/21/2012 10:00 AM Keitha Butte, NP [facility] MEDICAL ONCOLOGY [phone]   04/05/2013 1:00 PM Delcie Roch [facility] MEDICAL ONCOLOGY [phone]   04/05/2013 1:30 PM Lowella Dell, MD [facility] MEDICAL ONCOLOGY [phone]   Future Orders Complete By Expires     Diet - low sodium heart healthy  As directed     Increase activity slowly  As directed         Medication List         amitriptyline 100 MG tablet  Commonly known as:  ELAVIL  Take 100 mg by mouth at bedtime.  anastrozole 1 MG tablet  Commonly known as:  ARIMIDEX  Take 1 mg by mouth daily.     aspirin 81 MG EC tablet  Take 81 mg by mouth daily.     atorvastatin 40 MG tablet  Commonly known as:  LIPITOR  Take 40 mg by mouth daily.     bisacodyl 5 MG EC tablet  Commonly known as:  DULCOLAX  Take 5 mg by mouth daily as needed.     CALTRATE 600+D PLUS 600-400 MG-UNIT per tablet  Take 1  tablet by mouth daily.     Fish Oil 1000 MG Caps  Take 1,000 mg by mouth daily.     LORazepam 2 MG tablet  Commonly known as:  ATIVAN  Take 2 mg by mouth every 6 (six) hours as needed for anxiety. PRN............Marland KitchenMarland KitchenAnxiety and panic attacks     MAGNESIUM OXIDE PO  Take 1 tablet by mouth daily. Pt doesn't know the dose of the medication.     nitroGLYCERIN 0.4 MG SL tablet  Commonly known as:  NITROSTAT  Place 0.4 mg under the tongue every 5 (five) minutes as needed for chest pain.     quinapril 20 MG tablet  Commonly known as:  ACCUPRIL  Take 20 mg by mouth at bedtime.       No Known Allergies    The results of significant diagnostics from this hospitalization (including imaging, microbiology, ancillary and laboratory) are listed below for reference.    Significant Diagnostic Studies: Ct Abdomen Pelvis W Contrast  09/19/2012   *RADIOLOGY REPORT*  Clinical Data: Abdomen pain.  The patient has a history of cancer.  CT ABDOMEN AND PELVIS WITH CONTRAST  Technique:  Multidetector CT imaging of the abdomen and pelvis was performed following the standard protocol during bolus administration of intravenous contrast.  Contrast: OMNIPAQUE IOHEXOL 300 MG/ML  SOLN  Comparison: August 11, 2012  Findings: The liver, spleen, pancreas, adrenal glands and right kidney are normal.  There are small cysts within the left kidney. The patient status post prior cholecystectomy.  There is atherosclerosis of the abdominal aorta without aneurysmal dilatation.   There are multiple lymph nodes within the mesentery and retroperitoneum some are enlarged compared to prior exam. The largest conglomerate of lymph node measures 3.1 cm in the central mesentery on image 29 enlarged compared prior exam.  There are multiple dilated small bowel loops in the abdomen and pelvis with transition probably in the ileal loops.  The distal ileal loops are normal in caliber.  The colon is normal.  The appendix is normal.  Partial  fluid filled bladder is normal.  There is a low density lesion in the left adnexa measuring maximum 5.2 cm not significantly changed compared prior exam.  No acute abnormality is identified in the visualized lung bases.  There are degenerative joint changes of the spine.  IMPRESSION: Findings suspicious for small bowel obstruction.  Follow-up is recommended.  Retroperitoneal and mesenteric lymphadenopathy slightly worse compared prior exam.  Clinical correlation is recommended. Lymphoma is not excluded.   Original Report Authenticated By: Sherian Rein, M.D.   Dg Abd 2 Views  09/20/2012   *RADIOLOGY REPORT*  Clinical Data: Follow up of small bowel obstruction  ABDOMEN - 2 VIEW  Comparison: CT abdomen pelvis of 09/19/2012 and chest with acute abdomen of the same date  Findings: The lungs are not as well aerated with increase in bibasilar atelectasis.  Cardiomegaly is stable.  An NG tube extends into the stomach.  With NG tube present, there has been some interval improvement in the degree of small bowel gaseous distention.  Colonic bowel gas is demonstrated to the rectum.  No free air is seen on the erect view.  IMPRESSION:  1.  Some improvement in partial small bowel obstruction with NG tube present with the tip in the antrum of the stomach. 2.  No free air. 3.  Diminished aeration with increase in bibasilar atelectasis.   Original Report Authenticated By: Dwyane Dee, M.D.   Dg Abd Acute W/chest  09/19/2012   *RADIOLOGY REPORT*  Clinical Data: Generalized abdominal pain.  Nausea and vomiting. Chronic constipation.  Prior history of right mastectomy for breast cancer.  Prior cholecystectomy.  ACUTE ABDOMEN SERIES (ABDOMEN 2 VIEW & CHEST 1 VIEW)  Comparison: Unenhanced CT abdomen and pelvis 08/11/2012.  One-view chest x-ray 04/27/2012.  Findings: Multiple dilated loops of small bowel in the left upper quadrant and in the mid abdomen, demonstrating air fluid levels on the lateral decubitus image.  No evidence of  free intraperitoneal air.  Gas and moderate stool burden throughout normal caliber colon from cecum to rectum.  Surgical clips in the right upper quadrant from prior cholecystectomy.  Phleboliths in the pelvis.  No visible opaque urinary tract calculi.  Degenerative changes involving the lower thoracic and lumbar spine.  Cardiac silhouette mildly enlarged for the AP technique, unchanged. Prominent paracardiac fat pad on the left accounts for the blunted costophrenic angle.  Mild linear scar at the right lung base, unchanged.  Prominent bronchovascular markings diffusely and central peribronchial thickening, unchanged, consistent with chronic bronchitis.  No new pulmonary parenchymal abnormalities. No visible pleural effusions.  IMPRESSION:  1.  Partial small bowel obstruction.  No free intraperitoneal air. 2.  Stable mild cardiomegaly, COPD (chronic bronchitis), and scarring in the lung bases.  No acute cardiopulmonary disease.   Original Report Authenticated By: Hulan Saas, M.D.    Microbiology: No results found for this or any previous visit (from the past 240 hour(s)).   Labs: Basic Metabolic Panel:  Recent Labs Lab 09/19/12 1325 09/20/12 0451 09/21/12 0455  NA 137 139 138  K 4.0 4.2 3.9  CL 101 109 107  CO2 22 22 23   GLUCOSE 137* 114* 132*  BUN 14 13 9   CREATININE 0.69 0.68 0.70  CALCIUM 10.0 8.5 9.0   Liver Function Tests:  Recent Labs Lab 09/19/12 1325 09/20/12 0451  AST 19 14  ALT 27 19  ALKPHOS 276* 221*  BILITOT 0.3 0.4  PROT 7.6 6.0  ALBUMIN 3.8 2.8*    Recent Labs Lab 09/19/12 1325  LIPASE 21   No results found for this basename: AMMONIA,  in the last 168 hours CBC:  Recent Labs Lab 09/19/12 1325 09/20/12 0451 09/21/12 0455  WBC 14.2* 9.9 6.9  NEUTROABS 11.3*  --   --   HGB 14.4 12.8 11.9*  HCT 43.8 40.0 37.1  MCV 81.6 83.0 82.6  PLT 313 298 275   Cardiac Enzymes: No results found for this basename: CKTOTAL, CKMB, CKMBINDEX, TROPONINI,  in the  last 168 hours BNP: BNP (last 3 results) No results found for this basename: PROBNP,  in the last 8760 hours CBG:  Recent Labs Lab 09/20/12 1155 09/20/12 1928 09/20/12 2205 09/21/12 0130 09/21/12 0500  GLUCAP 86 74 99 110* 128*       Signed:  Amoni Scallan  [facility] 09/21/2012, 1:36 PM

## 2012-10-03 ENCOUNTER — Other Ambulatory Visit: Payer: Medicare Other

## 2012-11-14 ENCOUNTER — Telehealth: Payer: Self-pay | Admitting: Oncology

## 2012-11-14 NOTE — Telephone Encounter (Signed)
Pt called to confirm appts for lb/ct and f/u. Confirmed appts for 10/30 lb/ct and 11/6 f/u. Pt aware she will need to get prep for ct prior to test. Schedule mailed.

## 2012-11-24 ENCOUNTER — Telehealth: Payer: Self-pay | Admitting: Internal Medicine

## 2012-11-24 NOTE — Telephone Encounter (Signed)
Called, spoke with pt.  Pt states she went to see Dr. Jordan at [facility] as "kind of a second opinion."  Pt states Dr. Jordan recommended she continue with what CDY had recommended as far as the o2 goes.  Pt wanting to know if Dr. Maple Hudson can still see her bc she was seen by Dr. Jordan.  I advised yes, he can still she her for what he is following her for.  She verbalized understanding and voiced no further questions or concerns at this time.

## 2012-11-27 ENCOUNTER — Other Ambulatory Visit: Payer: Self-pay

## 2012-11-27 DIAGNOSIS — Z9011 Acquired absence of right breast and nipple: Secondary | ICD-10-CM

## 2012-11-27 DIAGNOSIS — Z1231 Encounter for screening mammogram for malignant neoplasm of breast: Secondary | ICD-10-CM

## 2012-11-28 ENCOUNTER — Other Ambulatory Visit: Payer: Self-pay | Admitting: *Deleted

## 2012-12-05 ENCOUNTER — Telehealth: Payer: Self-pay | Admitting: *Deleted

## 2012-12-05 NOTE — Telephone Encounter (Signed)
Patient r/s scan, we need to change lab appt from 12/14/12 to 12/06/12, same day as scan.

## 2012-12-06 ENCOUNTER — Ambulatory Visit (HOSPITAL_BASED_OUTPATIENT_CLINIC_OR_DEPARTMENT_OTHER): Payer: Medicare Other | Admitting: Lab

## 2012-12-06 ENCOUNTER — Ambulatory Visit (HOSPITAL_COMMUNITY)
Admission: RE | Admit: 2012-12-06 | Discharge: 2012-12-06 | Disposition: A | Discharge status: Home / Self Care | Payer: Medicare Other | Source: Ambulatory Visit | Attending: Oncology | Admitting: Oncology

## 2012-12-06 DIAGNOSIS — R599 Enlarged lymph nodes, unspecified: Secondary | ICD-10-CM | POA: Insufficient documentation

## 2012-12-06 DIAGNOSIS — C50919 Malignant neoplasm of unspecified site of unspecified female breast: Secondary | ICD-10-CM | POA: Insufficient documentation

## 2012-12-06 DIAGNOSIS — C50419 Malignant neoplasm of upper-outer quadrant of unspecified female breast: Secondary | ICD-10-CM

## 2012-12-06 LAB — CBC WITH DIFFERENTIAL/PLATELET
BASO%: 1 % (ref 0.0–2.0)
EOS%: 5.6 % (ref 0.0–7.0)
HCT: 40.3 % (ref 34.8–46.6)
MCH: 26.2 pg (ref 25.1–34.0)
MCHC: 32.8 g/dL (ref 31.5–36.0)
NEUT%: 70.6 % (ref 38.4–76.8)
RBC: 5.04 10*6/uL (ref 3.70–5.45)
lymph#: 1.7 10*3/uL (ref 0.9–3.3)

## 2012-12-06 LAB — POCT I-STAT, CHEM 8
Calcium, Ion: 1.13 mmol/L (ref 1.13–1.30)
Chloride: 106 mEq/L (ref 96–112)
Glucose, Bld: 101 mg/dL — ABNORMAL HIGH (ref 70–99)
HCT: 41 % (ref 36.0–46.0)
Hemoglobin: 13.9 g/dL (ref 12.0–15.0)
TCO2: 23 mmol/L (ref 0–100)

## 2012-12-06 LAB — COMPREHENSIVE METABOLIC PANEL (CC13)
ALT: 29 U/L (ref 0–55)
AST: 21 U/L (ref 5–34)
Calcium: 9.9 mg/dL (ref 8.4–10.4)
Chloride: 105 mEq/L (ref 98–109)
Creatinine: 0.8 mg/dL (ref 0.6–1.1)
Total Bilirubin: 0.59 mg/dL (ref 0.20–1.20)

## 2012-12-06 MED ORDER — IOHEXOL 300 MG/ML  SOLN
50.0000 mL | Freq: Once | INTRAMUSCULAR | Status: AC | PRN
  Administered 2012-12-06: 50 mL via ORAL

## 2012-12-06 MED ORDER — IOHEXOL 300 MG/ML  SOLN
100.0000 mL | Freq: Once | INTRAMUSCULAR | Status: AC | PRN
  Administered 2012-12-06: 100 mL via INTRAVENOUS

## 2012-12-13 ENCOUNTER — Telehealth: Payer: Self-pay | Admitting: *Deleted

## 2012-12-13 NOTE — Telephone Encounter (Signed)
Patient cancelled appt with Zollie Scale for follow up on labs and scans, will keep annual visit with Dr Darnelle Catalan in February.

## 2012-12-14 ENCOUNTER — Other Ambulatory Visit: Payer: Medicare Other | Admitting: Lab

## 2012-12-14 ENCOUNTER — Telehealth: Payer: Self-pay | Admitting: Internal Medicine

## 2012-12-14 ENCOUNTER — Ambulatory Visit (HOSPITAL_COMMUNITY): Payer: Medicare Other

## 2012-12-14 NOTE — Telephone Encounter (Signed)
I spoke with [name] and she is going to check on things and call us back tomorrow.

## 2012-12-14 NOTE — Telephone Encounter (Signed)
Suggest we ask our [facility] to discuss this with a liason from [facility], before we go through re-qualifying for home care services with a new company.

## 2012-12-14 NOTE — Telephone Encounter (Signed)
Called and spoke with pt and she stated that she wanted to see if CY was ok with her changing from [facility] to [facility] for her oxygen and supplies.  She stated that she has been with [facility] since 2009---but she stated that when she calls [facility] for any help--or if she has a problem that needs to be fixed she says that the people at [facility] are hard to talk to and they want her to try and fix the problem over the phone.  CY please advise. Thanks  No Known Allergies

## 2012-12-18 ENCOUNTER — Telehealth (INDEPENDENT_AMBULATORY_CARE_PROVIDER_SITE_OTHER): Payer: Self-pay | Admitting: General Surgery

## 2012-12-18 NOTE — Telephone Encounter (Signed)
I have sent a staff message to [facility] to follow-up on this message. Will await response. Carron Curie, CMA

## 2012-12-18 NOTE — Telephone Encounter (Signed)
I made patient aware she should not use that arm if she can help it. Lymphedema can happen years after surgery and she had 6 lymph nodes removed. She will call with any other questions.

## 2012-12-18 NOTE — Telephone Encounter (Signed)
Message copied by Liliana Cline on Mon Dec 18, 2012 11:11 AM ------      Message from: Isaias Sakai K      Created: Mon Dec 18, 2012 10:52 AM      Regarding: Dr Jamey Ripa      Contact: [phone]       When blood is drawn can the right arm be used after 12 yrs of right total mastecomy.  ------

## 2012-12-19 NOTE — Telephone Encounter (Signed)
Per [name]: Our customer service manager, Imelda Pillow, emailed me back over the weekend and said she would be calling this pt today. I'll let you guys know what happens. Thanks.

## 2012-12-19 NOTE — Telephone Encounter (Signed)
[name] reports [facility] spoke with pt yesterday. She advised [facility] they sometimes meet her expectations. With trouble shooting pt requested someone go to her home and show her whats wrong. They advised her in the future just to make [facility] aware this is what she wants. Nothing further needed

## 2012-12-19 NOTE — Telephone Encounter (Signed)
[name] returning call can be reached at [phone].[name] ° °

## 2012-12-21 ENCOUNTER — Ambulatory Visit: Payer: Medicare Other | Admitting: Family

## 2012-12-22 NOTE — Telephone Encounter (Signed)
DUPLICATE ENCOUNTER

## 2012-12-29 ENCOUNTER — Telehealth: Payer: Self-pay | Admitting: Pulmonary Disease

## 2012-12-29 NOTE — Telephone Encounter (Signed)
Called, spoke with pt.  She called in on 10/30 regarding switching from [facility] to [facility].  Pt states she was just calling to let us know that she will stay with [facility] for now.  But, if she continues to receive poor service, she would like to switch to [facility].  Pt states she will call us back if this does happen again to request we switch DMEs.  Also, pt was last seen in March 2014 by CDY.  Pt states she would like to schedule a f/u in the middle to end of Dec 2014 with CDY just to touch base and f/u given it has been a while.  We have scheduled pt to see CDY on Dec 31 at 10:30 am.  Pt aware and voiced no further questions or concerns at this time.

## 2013-01-03 ENCOUNTER — Telehealth: Payer: Self-pay | Admitting: *Deleted

## 2013-01-03 NOTE — Telephone Encounter (Signed)
Patient called and left message for labs to be mailed. Printed and left in mail pick up box.

## 2013-01-12 ENCOUNTER — Telehealth: Payer: Self-pay | Admitting: *Deleted

## 2013-01-12 ENCOUNTER — Other Ambulatory Visit: Payer: Self-pay | Admitting: Hematology & Oncology

## 2013-01-12 NOTE — Progress Notes (Signed)
[name] called stating that her bottle of Ativan was missing. She has a problem with anxiety.  I went ahead and call in her a prescription to get her through the weekend. I called her in #12 Ativan. These are 2 mg strength.  She will take care of the prescription when she sees Dr. on Monday.  Cindee Lame

## 2013-01-12 NOTE — Telephone Encounter (Signed)
Returned patient's call about a refill on Rx of Ativan 2mg  Q6 hr prn. Patient stated that she can't find her pills. She thinks they made have been thrown out by someone who was cleaning her house. She wants to know if she can get a refill on the Ativan. Told patient we can not refill at this time, she will need to call her PCP since she is no longer under active treatment for cancer.

## 2013-01-23 ENCOUNTER — Telehealth: Payer: Self-pay | Admitting: Internal Medicine

## 2013-01-23 MED ORDER — DOXYCYCLINE HYCLATE 100 MG PO TABS: ORAL_TABLET | ORAL | Status: DC

## 2013-01-23 NOTE — Telephone Encounter (Signed)
I called and spoke with pt. She c/o dry cough, raspy voice x 5 days. Denies any wheezing, no chest tx, no f/c/s/n/v. Not sure if she has any chest congestion. Has not taken anything for the cough. Please advise Dr. Maple Hudson thanks  No Known Allergies

## 2013-01-23 NOTE — Telephone Encounter (Signed)
lmomtcb x1 

## 2013-01-23 NOTE — Telephone Encounter (Signed)
Pt aware of recs and rx sent. Nothing further needed 

## 2013-01-23 NOTE — Telephone Encounter (Signed)
I called and spoke with pt. She is aware. Nothing further needed 

## 2013-01-23 NOTE — Telephone Encounter (Signed)
I called and spoke with pt. She was calling to make sure she didn't scare me with her voice. She reports she sounds terrible and voice was raspy. I advised her she did not scare me and to call if she needs [place] before then. Nothing further needed

## 2013-01-23 NOTE — Telephone Encounter (Signed)
Per CY-okay to give patient Doxycycline 100 mg #8 take 2 today then 1 daily no refills and have her use Mucinex OTC. Thanks.

## 2013-01-23 NOTE — Telephone Encounter (Signed)
[facility] pt line busy x 3 wcb 

## 2013-01-25 ENCOUNTER — Telehealth: Payer: Self-pay | Admitting: *Deleted

## 2013-01-25 NOTE — Telephone Encounter (Signed)
Left message for pt to return my call so I can schedule her a new appt w/ Dr. Darnelle Catalan.

## 2013-01-25 NOTE — Telephone Encounter (Signed)
Called pt and informed her that Dr. Darnelle Catalan would not be able to see her on 2/19 and needed to reschedule.  She was fine with that.  I confirmed 03/22/13 appt w/ pt.

## 2013-01-26 ENCOUNTER — Encounter (INDEPENDENT_AMBULATORY_CARE_PROVIDER_SITE_OTHER): Payer: Medicare Other | Admitting: Surgery

## 2013-01-29 ENCOUNTER — Ambulatory Visit (HOSPITAL_COMMUNITY)
Admission: RE | Admit: 2013-01-29 | Discharge: 2013-01-29 | Disposition: A | Discharge status: Home / Self Care | Payer: Medicare Other | Source: Ambulatory Visit | Attending: Internal Medicine | Admitting: Internal Medicine

## 2013-01-29 ENCOUNTER — Ambulatory Visit: Payer: Medicare Other

## 2013-01-29 DIAGNOSIS — Z9011 Acquired absence of right breast and nipple: Secondary | ICD-10-CM

## 2013-01-29 DIAGNOSIS — Z1231 Encounter for screening mammogram for malignant neoplasm of breast: Secondary | ICD-10-CM | POA: Insufficient documentation

## 2013-02-05 ENCOUNTER — Telehealth: Payer: Self-pay | Admitting: *Deleted

## 2013-02-05 NOTE — Telephone Encounter (Signed)
This RN has been trying to return the patient's phone call but the line has been busy on several attempts. Will continue to call the patient.

## 2013-02-13 ENCOUNTER — Other Ambulatory Visit: Payer: Self-pay | Admitting: Cardiology

## 2013-02-14 ENCOUNTER — Ambulatory Visit: Payer: Medicare Other | Admitting: Internal Medicine

## 2013-03-01 ENCOUNTER — Ambulatory Visit: Payer: Medicare Other | Admitting: Internal Medicine

## 2013-03-13 ENCOUNTER — Encounter: Payer: Self-pay | Admitting: Internal Medicine

## 2013-03-13 ENCOUNTER — Ambulatory Visit (INDEPENDENT_AMBULATORY_CARE_PROVIDER_SITE_OTHER): Payer: Medicare HMO | Admitting: Internal Medicine

## 2013-03-13 VITALS — BP 120/74 | HR 94 | Ht 62.0 in | Wt 170.0 lb

## 2013-03-13 DIAGNOSIS — J209 Acute bronchitis, unspecified: Secondary | ICD-10-CM

## 2013-03-13 DIAGNOSIS — G4730 Sleep apnea, unspecified: Principal | ICD-10-CM

## 2013-03-13 DIAGNOSIS — R0602 Shortness of breath: Secondary | ICD-10-CM

## 2013-03-13 DIAGNOSIS — F22 Delusional disorders: Secondary | ICD-10-CM

## 2013-03-13 DIAGNOSIS — G4700 Insomnia, unspecified: Secondary | ICD-10-CM

## 2013-03-13 NOTE — Patient Instructions (Signed)
We can continue oxygen 3L/ [facility] for sleep  Dr Toy Care will manage your medications

## 2013-03-13 NOTE — Progress Notes (Signed)
Patient ID: [name], female    DOB: Apr 19, 1935, 78 y.o.   MRN: [id number]  HPI 08/20/10- 33 yoF never smoker followed for OSA, dyspnea, rhinitis, complicated by mental health issues( Dr Toy Care) and by hx right mastectomy/ breast Ca Last here 02/05/10- note reviewed. She continues CPAP. Download showed use 3.5 hrs/ night. She says she pulls it off in her sleep.  The download indicated good control AHI 3.9, at 12 cwp best pressure. She seems motivated to continue. She denies waking much during the night.  Since last here she went hospital for cath due to recurrent chest pain- told no coronary blockage.  She no longer has home oxygen. Overnight oximetry on CPAP/ room air 07/21/10- 2 minutes, 20 seconds with sat < 88%. This is good enough to leave O2 off as she wishes.   02/12/11-  75 yoF never smoker followed for OSA, dyspnea, rhinitis, complicated by mental health issues( Dr Toy Care) and by hx right mastectomy/ breast Ca Failed CPAP- kept pulling it off. She does wear oxygen all night at 2 L as intended and sleeps better with this. Watery rhinorrhea in the mornings. She declines flu vaccine. Has avoided recent respiratory infections. She has been following politics closely and was quite sharp about these issues.  09/22/11- 76 yoF never smoker followed for OSA, dyspnea, rhinitis, complicated by mental health issues( Dr Toy Care) and by hx right mastectomy/ breast Ca Acute visit-sore throat x7 days. Raw throat-hurts to swallow-was given Amoxicillin and has helped; runny nose during the day, cough at night. We had called in amoxicillin earlier and she says that helped. OSA- she discussed her CPAP intolerance with Dr.Kaur/ Psychiatry, who blamed anxiety for taking the mask off. She sleeps comfortably using oxygen. She referred to "female" who comes in and out of her house, adjusts her oxygen and other things but apparently has never been seen by anybody. She can just tell he's been there because she sees things have  been moved.  02/04/12- 76 yoF never smoker followed for OSA, dyspnea, rhinitis, complicated by mental health issues( Dr Toy Care) and by hx right mastectomy/ breast Ca FOLLOWS WHQ:PRFFMBW options of going back on CPAP vs just O2(states that she woke up again and "tutti frutti"( invisible man who moves her furniture and takes her mask off at night) took mask off-she was able to put back on and continue sleeping) She declines flu vaccine. She had asked to sleep with a simple mask and oxygen- through [facility], this required 5 L. She has been taping on her mask at night but still finds it pulled off. Security officers have checked her house and under her bed but don't see him because "he curls up under the blanket". CXR 1/27/ 13 IMPRESSION:  No evidence of active pulmonary disease. Cardiac enlargement.  Original Report Authenticated By: Neale Burly, M.D.  04/27/12- 6 yoF never smoker followed for OSA, dyspnea, rhinitis, complicated by mental health issues( Dr Toy Care) and by hx right mastectomy/ breast Ca ACUTE VISIT: coughing thorugh the night that sounds raspy-wheezing, States runny nose and cough is coming from O2 at night. Also, would like to speak with CY about maybe using CPAP again. Has not pulled O2 tubing out of nose at all. She blames "stress" as the reason why she would pull her CPAP of repeatedly at night. She says that stress is now much better and she would like to try CPAP again. Noticing raspy cough especially at night over the last 2 months. Denies pain  in throat for chest. Little sputum. No fever.  03/13/13- 44 yoF never smoker followed for OSA/ failed CPAP, dyspnea, rhinitis, complicated by mental health issues( Dr Toy Care) and by hx right mastectomy/ breast Ca         Aide here from [place]. FOLLOWS FOR: sleeping well as long as she has her Rx- amitriptyline. Patient reports breathing fine without need for humidifier on her oxygen concentrator. Continuous oxygen 3 L/[facility]. She again  actively discussed her delusional character "[name]", who was never seen by others, has followed her through moves, and enters her apartment through locked doors to unplug her oxygen tubing and take it out of her nose at night, repeatedly take off her CPAP when she was still trying to wear it.  Review of Systems-see HPI Constitutional:   No-   weight loss, night sweats, fevers, chills, fatigue, lassitude. HEENT:   No-  headaches, difficulty swallowing, tooth/dental problems, sore throat,       No-  sneezing, itching, ear ache, nasal congestion,  +post nasal drip,  CV:  No-   chest pain, orthopnea, PND, swelling in lower extremities, anasarca, dizziness, palpitations Resp: No- acute  shortness of breath with exertion or at rest, wearing oxygen.            No-   productive cough,  + non-productive cough,  No- coughing up of blood.              No-   change in color of mucus.  No- wheezing.   Skin: No-   rash or lesions. GI:  No-   heartburn, indigestion, abdominal pain, nausea, vomiting,  GU:  MS:  No-   joint pain or swelling.   Neuro-     nothing unusual Psych:  No- change in mood or affect. No acute depression or anxiety.  No memory loss.  Objective:  Physical Exam  General- Alert, Oriented, Affect-cheerful, Distress- none acute, bright and alert, wheelchair,             wearing her same winter hat. Obese Skin- rash-none, lesions- none, excoriation- none Lymphadenopathy- none Head- atraumatic            Eyes- Gross vision intact, PERRLA, conjunctivae clear secretions            Ears- Hearing, canals-normal            Nose- Clear, no-Septal dev, mucus, polyps, erosion, perforation             Throat- Mallampati II , mucosa clear , drainage- none, tonsils- atrophic Neck- flexible , trachea midline, no stridor , thyroid nl, carotid no bruit Chest - symmetrical excursion , unlabored           Heart/CV- RRR , no murmur , no gallop  , no rub, nl s1 s2                           - JVD-  none , edema- none, stasis changes- none, varices- none           Lung- clear to P&A, wheeze- none, cough- none , dullness-none, rub- none           Chest wall- right mastectomy Abd-  Br/ Gen/ Rectal- Not done, not indicated Extrem- cyanosis- none, clubbing, none, atrophy- none, strength- nl Neuro- grossly intact to observation

## 2013-03-20 ENCOUNTER — Telehealth: Payer: Self-pay | Admitting: Internal Medicine

## 2013-03-20 NOTE — Telephone Encounter (Signed)
atc pt line busy wcb

## 2013-03-21 ENCOUNTER — Ambulatory Visit (HOSPITAL_BASED_OUTPATIENT_CLINIC_OR_DEPARTMENT_OTHER): Payer: Medicare HMO | Admitting: Oncology

## 2013-03-21 ENCOUNTER — Telehealth: Payer: Self-pay | Admitting: Oncology

## 2013-03-21 ENCOUNTER — Other Ambulatory Visit: Payer: Self-pay | Admitting: Oncology

## 2013-03-21 ENCOUNTER — Telehealth: Payer: Self-pay | Admitting: Physician Assistant

## 2013-03-21 ENCOUNTER — Other Ambulatory Visit (HOSPITAL_BASED_OUTPATIENT_CLINIC_OR_DEPARTMENT_OTHER): Payer: Medicare HMO

## 2013-03-21 VITALS — BP 124/74 | HR 98 | Temp 97.9°F | Resp 18 | Ht 62.0 in

## 2013-03-21 DIAGNOSIS — C50919 Malignant neoplasm of unspecified site of unspecified female breast: Secondary | ICD-10-CM

## 2013-03-21 DIAGNOSIS — Z853 Personal history of malignant neoplasm of breast: Secondary | ICD-10-CM

## 2013-03-21 LAB — COMPREHENSIVE METABOLIC PANEL (CC13)
ALBUMIN: 3.7 g/dL (ref 3.5–5.0)
ALK PHOS: 288 U/L — AB (ref 40–150)
ALT: 31 U/L (ref 0–55)
AST: 19 U/L (ref 5–34)
Anion Gap: 13 mEq/L — ABNORMAL HIGH (ref 3–11)
BUN: 12.8 mg/dL (ref 7.0–26.0)
CALCIUM: 9.7 mg/dL (ref 8.4–10.4)
CHLORIDE: 106 meq/L (ref 98–109)
CO2: 21 mEq/L — ABNORMAL LOW (ref 22–29)
Creatinine: 0.8 mg/dL (ref 0.6–1.1)
Glucose: 125 mg/dl (ref 70–140)
POTASSIUM: 3.9 meq/L (ref 3.5–5.1)
SODIUM: 140 meq/L (ref 136–145)
Total Bilirubin: 0.54 mg/dL (ref 0.20–1.20)
Total Protein: 7.5 g/dL (ref 6.4–8.3)

## 2013-03-21 LAB — CBC WITH DIFFERENTIAL/PLATELET
BASO%: 0.9 % (ref 0.0–2.0)
Basophils Absolute: 0.1 10*3/uL (ref 0.0–0.1)
EOS%: 4 % (ref 0.0–7.0)
Eosinophils Absolute: 0.4 10*3/uL (ref 0.0–0.5)
HCT: 43.1 % (ref 34.8–46.6)
HEMOGLOBIN: 13.8 g/dL (ref 11.6–15.9)
LYMPH#: 1.6 10*3/uL (ref 0.9–3.3)
LYMPH%: 14.4 % (ref 14.0–49.7)
MCH: 25.9 pg (ref 25.1–34.0)
MCHC: 32 g/dL (ref 31.5–36.0)
MCV: 81 fL (ref 79.5–101.0)
MONO#: 0.6 10*3/uL (ref 0.1–0.9)
MONO%: 5.2 % (ref 0.0–14.0)
NEUT#: 8.2 10*3/uL — ABNORMAL HIGH (ref 1.5–6.5)
NEUT%: 75.5 % (ref 38.4–76.8)
Platelets: 300 10*3/uL (ref 145–400)
RBC: 5.32 10*6/uL (ref 3.70–5.45)
RDW: 16.5 % — AB (ref 11.2–14.5)
WBC: 10.9 10*3/uL — ABNORMAL HIGH (ref 3.9–10.3)

## 2013-03-21 MED ORDER — ANASTROZOLE 1 MG PO TABS: 1.0000 mg | ORAL_TABLET | Freq: Every day | ORAL | Status: DC

## 2013-03-21 NOTE — Progress Notes (Signed)
[employer]  [phone] [phone]     ID: [name] OB: Feb 22, 1972  MR#: [id number]  BEE#:[id number]  PCP: Tivis Ringer, MD GYN:  Uvaldo Rising MD SU:  OTHER MD: Keturah Barre, McKinley Melinda Crutch  CHIEF COMPLAINT: "I have breast cancer"  HISTORY OF PRESENT ILLNESS: From doctor Elenore Rota Murinson's intake note 04/05/2012:  "This patient was a long-term patient of Dr. Eston Esters going back to the  fall of 2002 when she was diagnosed with stage IIIA invasive lobular  carcinoma of the right breast. Fortunately, the patient has done well.  She denies any symptoms to suggest recurrence of her breast cancer  The patient was last seen by Dr. Truddie Coco on 02/23/2011. The patient tells  me that she lives in an apartment. She apparently is fairly much  wheelchair-bound because of arthritis. She is taking some physical  therapy. She says that she used to use a walker, but has not used that  in some time. She was able to get to her appointment by [facility]. She  apparently drives. She last drove 3 weeks ago. She says that she  passed her driving test recently. She has a medical form which she is  requesting that I sign; however, I told her that I felt that I could not  attest to her general medical health and recommended that she either  have her primary physician sign this or perhaps Dr. Stanford Breed or Dr.  Annamaria Boots who are more familiar with her general medical condition. The  patient is without any complaints or recent problems."  The patient tells me she was diagnosed by Dr. Clarnce Flock nose and referred to Dr. Osborn Coho who performed her mastectomy in 2002. She received her chemotherapy under Dr. Eston Esters.The patient's breast cancer history is summarized below  INTERVAL HISTORY: [name] returns today for followup of her remote breast cancer. She is establishing herself on my service today  REVIEW OF SYSTEMS: She has multiple medical problems  which are chronic. She sleeps poorly. She is now receiving rehabilitation but most of the time she is in a wheelchair. She uses oxygen at home. She has hearing problems. Her teeth are not in good repair. She has poor circulation. She keeps a dry cough. She has pain in many joints and in her back. She has occasional headaches, but no nausea or vomiting, and no dizziness or visual changes. She has irritable bowel syndrome. She feels anxious but not depressed. She has not noticed any change in either breast or chest wall. A detailed review of systems today was noncontributory except as noted  PAST MEDICAL HISTORY: Past Medical History  Diagnosis Date  . Sleep apnea     insomnia  . Hyperlipidemia   . Hypertension   . Anxiety   . Chronic headache   . Osteoarthritis   . Panic attacks   . CAD (coronary artery disease)   . Depression   . Chronic chest pain   . Malignant neoplasm of breast     right breast  . Constipation     PAST SURGICAL HISTORY: Past Surgical History  Procedure Laterality Date  . Cholecystectomy    . Right mastectomy    . Tonsillectomy    . Knee surgery    . US echocardiography  10/04/2006    EF 55-60%  . Heart stent  May 2011 per pt  . Portacath placement      x 2  . Inner ear surgery  FAMILY HISTORY No family history on file. The patient tells me she knew little about her father and mother, other than the fact that her father died in his late 78s and her mother in her early 31s. She had 5 brothers, no sisters. There is no history of breast or ovarian cancer in the family  GYNECOLOGIC HISTORY:  Menarche age 25, first live birth age 13, she is [place] P3. She underwent menopause in her mid 45s and took Prempro for approximately 5 years  SOCIAL HISTORY:  [name] bachelor's degree in criminal justice from [employer]. 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 She tells me she started the [employer] and  [place] and also worked with the [employer] in [place] for many years. She became divorced in 22. She lives alone, with no pets. Her son [name], a Engineer, structural, committed suicide in 1999. Daughter [name] is an Haematologist in [place]. "We are not in touch". Son [name] lives in [place]. She has not seen him in over a year and she tells me they have blocked there phone so she cannot get through. Nevertheless this son is the one she has chosen is her healthcare power of attorney. The patient's friend [name], runs a writing service, comes by her house on Mondays Wednesdays and Fridays and the Department of Social Services provides an aide on Tuesdays and Thursdays.    ADVANCED DIRECTIVES: Not in place. The patient tells me her son [name] is her healthcare power of attorney. He can be reached at work [phone], or at home, [place]: History  Substance Use Topics  . Smoking status: Never Smoker   . Smokeless tobacco: Never Used  . Alcohol Use: No    No Known Allergies  Current Outpatient Prescriptions  Medication Sig Dispense Refill  . amitriptyline (ELAVIL) 100 MG tablet Take 100 mg by mouth at bedtime.      Marland Kitchen anastrozole (ARIMIDEX) 1 MG tablet Take 1 mg by mouth daily.      Marland Kitchen aspirin 81 MG EC tablet Take 81 mg by mouth daily.        Marland Kitchen atorvastatin (LIPITOR) 40 MG tablet TAKE 1 TABLET EVERY DAY  30 tablet  1  . Calcium Carbonate-Vit D-Min (CALTRATE 600+D PLUS) 600-400 MG-UNIT per tablet Take 1 tablet by mouth daily.        Marland Kitchen LORazepam (ATIVAN) 2 MG tablet Take 2 mg by mouth every 6 (six) hours as needed for anxiety. PRN............Marland KitchenMarland KitchenAnxiety and panic attacks      . MAGNESIUM OXIDE PO Take 1 tablet by mouth daily. Pt doesn't know the dose of the medication.      . nitroGLYCERIN (NITROSTAT) 0.4 MG SL tablet Place 0.4 mg under the tongue every 5 (five) minutes as needed for chest pain.      . Omega-3 Fatty Acids (FISH OIL) 1000 MG CAPS  Take 1,000 mg by mouth daily.      . polyethylene glycol (MIRALAX / GLYCOLAX) packet Take 17 g by mouth 2 (two) times daily.      . quinapril (ACCUPRIL) 20 MG tablet Take 20 mg by mouth at bedtime.        No current facility-administered medications for this visit.    OBJECTIVE: Older white female in who appears in poor health Filed Vitals:   03/21/13 1430  BP: 124/74  Pulse: 98  Temp: 97.9 F (36.6 C)  Resp: 18  Body mass index is 0.00 kg/(m^2).    ECOG FS:2 - Symptomatic, <50% confined to bed  Ocular: Sclerae unicteric, pupils equal and round Ear-nose-throat: Oropharynx clear, dentition poor Lymphatic: No cervical or supraclavicular adenopathy Lungs no rales or rhonchi, fair excursion bilaterally Heart regular rate and rhythm Abd soft, obese, nontender, positive bowel sounds MSK kyphosis but no focal spinal tenderness Neuro: non-focal, well-oriented, appropriate affect; possible delusional thinking (the patient tells me there is someone in her neighborhood who has picked her locks and walks up and down in her apartment at night. She can hear the steps. However when she calls a police no one is there. She came today with a garbage bag have full of positions because during the day when she does not have the chain lock in place "things are not safe in the house".) Breasts: The right breast is status post mastectomy. There is no evidence of local recurrence. The right axilla is benign The left breast is unremarkable   LAB RESULTS:  CMP     Component Value Date/Time   NA 137 12/06/2012 1417   NA 138 12/06/2012 1313   K 4.0 12/06/2012 1417   K 5.1 12/06/2012 1313   CL 106 12/06/2012 1313   CL 107 04/05/2012 1158   CO2 23 12/06/2012 1417   CO2 23 09/21/2012 0455   GLUCOSE 91 12/06/2012 1417   GLUCOSE 101* 12/06/2012 1313   GLUCOSE 144* 04/05/2012 1158   BUN 11.8 12/06/2012 1417   BUN 16 12/06/2012 1313   CREATININE 0.8 12/06/2012 1417   CREATININE 0.80 12/06/2012 1313    CALCIUM 9.9 12/06/2012 1417   CALCIUM 9.0 09/21/2012 0455   PROT 7.6 12/06/2012 1417   PROT 6.0 09/20/2012 0451   ALBUMIN 3.6 12/06/2012 1417   ALBUMIN 2.8* 09/20/2012 0451   AST 21 12/06/2012 1417   AST 14 09/20/2012 0451   ALT 29 12/06/2012 1417   ALT 19 09/20/2012 0451   ALKPHOS 292* 12/06/2012 1417   ALKPHOS 221* 09/20/2012 0451   BILITOT 0.59 12/06/2012 1417   BILITOT 0.4 09/20/2012 0451   GFRNONAA 83* 09/21/2012 0455   GFRAA >90 09/21/2012 0455    I No results found for this basename: SPEP, UPEP,  kappa and lambda light chains    Lab Results  Component Value Date   WBC 10.9* 03/21/2013   NEUTROABS 8.2* 03/21/2013   HGB 13.8 03/21/2013   HCT 43.1 03/21/2013   MCV 81.0 03/21/2013   PLT 300 03/21/2013      Chemistry      Component Value Date/Time   NA 137 12/06/2012 1417   NA 138 12/06/2012 1313   K 4.0 12/06/2012 1417   K 5.1 12/06/2012 1313   CL 106 12/06/2012 1313   CL 107 04/05/2012 1158   CO2 23 12/06/2012 1417   CO2 23 09/21/2012 0455   BUN 11.8 12/06/2012 1417   BUN 16 12/06/2012 1313   CREATININE 0.8 12/06/2012 1417   CREATININE 0.80 12/06/2012 1313      Component Value Date/Time   CALCIUM 9.9 12/06/2012 1417   CALCIUM 9.0 09/21/2012 0455   ALKPHOS 292* 12/06/2012 1417   ALKPHOS 221* 09/20/2012 0451   AST 21 12/06/2012 1417   AST 14 09/20/2012 0451   ALT 29 12/06/2012 1417   ALT 19 09/20/2012 0451   BILITOT 0.59 12/06/2012 1417   BILITOT 0.4 09/20/2012 0451       Lab Results  Component Value Date   LABCA2 33 12/06/2012    No  components found with this basename: [id number]    No results found for this basename: INR,  in the last 168 hours  Urinalysis    Component Value Date/Time   COLORURINE AMBER* 09/19/2012 1438   APPEARANCEUR CLOUDY* 09/19/2012 1438   LABSPEC 1.035* 09/19/2012 1438   PHURINE 5.0 09/19/2012 1438   GLUCOSEU NEGATIVE 09/19/2012 1438   HGBUR NEGATIVE 09/19/2012 1438   BILIRUBINUR SMALL* 09/19/2012 1438   KETONESUR NEGATIVE 09/19/2012 1438   PROTEINUR NEGATIVE 09/19/2012  1438   UROBILINOGEN 0.2 09/19/2012 1438   NITRITE NEGATIVE 09/19/2012 1438   LEUKOCYTESUR NEGATIVE 09/19/2012 1438    STUDIES: CLINICAL DATA: Screening.  EXAM:  LEFT DIGITAL SCREENING MAMMOGRAM WITH CAD  COMPARISON: Previous exam(s)  ACR Breast Density Category b: There are scattered areas of  fibroglandular density.  FINDINGS:  No suspicious masses, architectural distortion, or calcifications  are present. There are no findings suspicious for malignancy. The  patient has had a right mastectomy.  Images were processed with CAD.  IMPRESSION:  No mammographic evidence of malignancy. A result letter of this  screening mammogram will be mailed directly to the patient.  RECOMMENDATION:  Screening mammogram in one year. (Code:SM-B-01Y)  BI-RADS CATEGORY 1: Negative  Electronically Signed  By: Rosalie Gums M.D.  On: 01/30/2013 09:15    ASSESSMENT: 78 y.o. [place] woman with 1. Invasive lobular carcinoma of the right breast dating back to  November 2002. pT3 pN1a M0, Stage  IIIA.  A. Estrogen receptor and progesterone receptor were both 94%. HER-2/neu was  negative. Ki67 was 6%,DNA index was diploid.   B. The patient underwent a modified radical mastectomy on 01/03/2001.   C. She was treated adjuvantlywith 6 cycles of 5-fluorouracil, epirubicin, and  Cyclophosphamide. Chemotherapy treatments were concluded in May 2003   D.on Arimidex since May 2003, continued at the patient's request.   E. normal DEXA scan 12/29/2009  2. Coronary artery disease. Patient apparently had a stent placed by  Dr. Charlies Constable in 2011.   3. Hypertension.   4. Sleep apnea. The patient is on nocturnal oxygen 3 L/minute.   5. Generalized arthritis.   6. Irritable bowel syndrome.  PLAN: Today's visit took well over an hour. I reviewed Therasa's situation in detail with her and she understands we generally release patients at 10 years of followup. She would prefer to be followed on a once a year basis  indefinitely however. We also discussed the fact that we do not have data for continuing Arimidex beyond 5 years, much less beyond 10 years. Nevertheless she feels it really helps her be less anxious at to be on "something" for her breast cancer. Finally I explained that we have stopped taking the CA 27-29 because it is not a reliable tumor marker. Nevertheless she are in much would like to have it checked every 6 months.  After much discussion we decided she would have lab work every 6 months and a visit every year here. We are going to continue the anastrozole and I called in a prescription for her. She was concerned about her alkaline phosphatase and I explained she has "fatty liver". She wanted diet suggestions and I told her to avoid  "the whites" namely rice, potatoes, bread, and pasta. If she actually does that I do feel her health will improve. Unfortunately she is not able to exercise at this point, but perhaps with a bit of physical therapy, which he is receiving, she might be able to go back to a walker.  The  patient is a good understanding of the overall plan. She will call with any problems that may develop before her next visit here.   Marland KitchenChauncey Cruel, MD   03/21/2013 2:39 PM

## 2013-03-21 NOTE — Telephone Encounter (Signed)
, °

## 2013-03-21 NOTE — Telephone Encounter (Signed)
[id number].Jyllian Haynie, CMA  

## 2013-03-21 NOTE — Telephone Encounter (Signed)
I spoke with the pt and she states she forgot to mention a few things to CY at last OV. She states she wanted him to know that she is no longer having a runny nose in the mornings, no SOB, no nausea, and she is not having to use her humidifier. I advised I will let CY know. I will forward as an FYI. [place] Bing, CMA

## 2013-03-22 ENCOUNTER — Encounter: Payer: Self-pay | Admitting: Cardiology

## 2013-03-22 ENCOUNTER — Telehealth: Payer: Self-pay | Admitting: Oncology

## 2013-03-22 ENCOUNTER — Ambulatory Visit (INDEPENDENT_AMBULATORY_CARE_PROVIDER_SITE_OTHER): Payer: Medicare HMO | Admitting: Cardiology

## 2013-03-22 VITALS — BP 124/56 | HR 92 | Ht 62.0 in

## 2013-03-22 DIAGNOSIS — I2510 Atherosclerotic heart disease of native coronary artery without angina pectoris: Secondary | ICD-10-CM

## 2013-03-22 DIAGNOSIS — E785 Hyperlipidemia, unspecified: Secondary | ICD-10-CM

## 2013-03-22 MED ORDER — ATORVASTATIN CALCIUM 40 MG PO TABS: 40.0000 mg | ORAL_TABLET | Freq: Every day | ORAL | Status: DC

## 2013-03-22 MED ORDER — QUINAPRIL HCL 20 MG PO TABS: 20.0000 mg | ORAL_TABLET | Freq: Every day | ORAL | Status: DC

## 2013-03-22 NOTE — Patient Instructions (Signed)
Your physician wants you to follow-up in: ONE YEAR WITH DR CRENSHAW You will receive a reminder letter in the mail two months in advance. If you don't receive a letter, please call our office to schedule the follow-up appointment.   Your physician recommends that you HAVE LAB WORK TODAY 

## 2013-03-22 NOTE — Assessment & Plan Note (Signed)
Continue aspirin and statin. 

## 2013-03-22 NOTE — Telephone Encounter (Signed)
, °

## 2013-03-22 NOTE — Assessment & Plan Note (Signed)
Blood pressure controlled. Continue present medications. 

## 2013-03-22 NOTE — Progress Notes (Signed)
HPI: FU coronary artery disease. Patient is status post PCI of her LAD in May of 2011. She had a drug-eluting stent at that time. There was no significant disease in her circumflex or right coronary artery. Her LV function was normal. She has had problems with recurrent atypical chest pain. A Myoview was performed in November of 2011 and showed an EF of 72% and normal perfusion. Repeat cardiac catheterization in May 2012 showed normal LV function with a patent stent in the LAD. No obstructive disease. Chest CT in May of 2012 showed no pulmonary embolus. I last saw patient in Feb 2014. Since then, the patient denies any dyspnea, orthopnea, PND, palpitations, syncope, or edema. Brief episode of chest pain 4 months ago but otherwise no pain with activities.   Current Outpatient Prescriptions  Medication Sig Dispense Refill  . amitriptyline (ELAVIL) 100 MG tablet Take 100 mg by mouth at bedtime.      Marland Kitchen anastrozole (ARIMIDEX) 1 MG tablet Take 1 tablet (1 mg total) by mouth daily.  90 tablet  40  . aspirin 81 MG EC tablet Take 81 mg by mouth daily.        Marland Kitchen atorvastatin (LIPITOR) 40 MG tablet TAKE 1 TABLET EVERY DAY  30 tablet  1  . Calcium Carbonate-Vit D-Min (CALTRATE 600+D PLUS) 600-400 MG-UNIT per tablet Take 1 tablet by mouth daily.        Marland Kitchen LORazepam (ATIVAN) 2 MG tablet Take 2 mg by mouth every 6 (six) hours as needed for anxiety. PRN............Marland KitchenMarland KitchenAnxiety and panic attacks      . MAGNESIUM OXIDE PO Take 1 tablet by mouth daily. Pt doesn't know the dose of the medication.      . nitroGLYCERIN (NITROSTAT) 0.4 MG SL tablet Place 0.4 mg under the tongue every 5 (five) minutes as needed for chest pain.      . Omega-3 Fatty Acids (FISH OIL) 1000 MG CAPS Take 1,000 mg by mouth daily.      . polyethylene glycol (MIRALAX / GLYCOLAX) packet Take 17 g by mouth 2 (two) times daily.      . quinapril (ACCUPRIL) 20 MG tablet Take 20 mg by mouth at bedtime.        No current facility-administered  medications for this visit.     Past Medical History  Diagnosis Date  . Sleep apnea     insomnia  . Hyperlipidemia   . Hypertension   . Anxiety   . Chronic headache   . Osteoarthritis   . Panic attacks   . CAD (coronary artery disease)   . Depression   . Chronic chest pain   . Malignant neoplasm of breast     right breast  . Constipation     Past Surgical History  Procedure Laterality Date  . Cholecystectomy    . Right mastectomy    . Tonsillectomy    . Knee surgery    . US echocardiography  10/04/2006    EF 55-60%  . Heart stent  May 2011 per pt  . Portacath placement      x 2  . Inner ear surgery      History   Social History  . Marital Status: Divorced    Spouse Name: N/A    Number of Children: 2  . Years of Education: N/A   Occupational History  . Unemployed    Social History Main Topics  . Smoking status: Never Smoker   . Smokeless tobacco: Never Used  .  Alcohol Use: No  . Drug Use: No  . Sexual Activity: Not on file   Other Topics Concern  . Not on file   Social History Narrative  . No narrative on file    ROS: no fevers or chills, productive cough, hemoptysis, dysphasia, odynophagia, melena, hematochezia, dysuria, hematuria, rash, seizure activity, orthopnea, PND, pedal edema, claudication. Remaining systems are negative.  Physical Exam: Well-developed obese in no acute distress.  Skin is warm and dry.  HEENT is normal.  Neck is supple.  Chest is clear to auscultation with normal expansion.  Cardiovascular exam is regular rate and rhythm.  Abdominal exam nontender or distended. No masses palpated. Extremities show trace edema. neuro grossly intact  ECG sinus rhythm with no ST changes.

## 2013-03-22 NOTE — Assessment & Plan Note (Signed)
Continue statin. Liver functions are being monitored by oncology. Check lipids.

## 2013-03-27 ENCOUNTER — Telehealth: Payer: Self-pay | Admitting: Cardiology

## 2013-03-27 NOTE — Telephone Encounter (Signed)
Left message for pt, she does not need lab work from our standpoint.

## 2013-03-27 NOTE — Telephone Encounter (Signed)
New problem   Pt stated the two girls in the lab couldn't draw her blood. She need bmp drawn. Please call pt concerning this matter.

## 2013-03-27 NOTE — Telephone Encounter (Signed)
New messsage     Call lab order in to [facility]---[address]---easier for pt to go there--she is not feeling well

## 2013-03-28 ENCOUNTER — Telehealth: Payer: Self-pay | Admitting: Cardiology

## 2013-03-28 ENCOUNTER — Other Ambulatory Visit: Payer: Medicare HMO

## 2013-03-28 NOTE — Telephone Encounter (Signed)
New message    Talk to Hilda Blades again.

## 2013-03-28 NOTE — Telephone Encounter (Signed)
Spoke with pt, explained we have results of blood work from 03-23-13. She reports we do not and she is going to have it checked at urgent care and send it to me. Pt hung up the phone.

## 2013-03-29 ENCOUNTER — Encounter: Payer: Self-pay | Admitting: *Deleted

## 2013-03-29 NOTE — Telephone Encounter (Signed)
New message    Want nurse to know that she is sorry she missed you yesterday.  Yesterday was her birthday and she had friends over and did not want to talk on the phone.  Pls call today.

## 2013-03-29 NOTE — Telephone Encounter (Signed)
Spoke with pt, aware I have the blood work results.

## 2013-04-05 ENCOUNTER — Ambulatory Visit: Payer: Medicare Other

## 2013-04-05 ENCOUNTER — Ambulatory Visit: Payer: Medicare Other | Admitting: Oncology

## 2013-04-05 ENCOUNTER — Other Ambulatory Visit: Payer: Medicare Other | Admitting: Lab

## 2013-04-05 ENCOUNTER — Other Ambulatory Visit: Payer: Medicare Other

## 2013-04-08 NOTE — Assessment & Plan Note (Signed)
Currently controlled.

## 2013-04-08 NOTE — Assessment & Plan Note (Signed)
She has continued with oxygen and seems to use it most nights although she does take it off occasionally in her sleep, blaming "[place]". Similarly, she had repeatedly taken off CPAP and was noncompliant with CPAP and BiPAP so these were discontinued by home care.

## 2013-04-08 NOTE — Assessment & Plan Note (Signed)
Obesity with deconditioning and probable obesity hypoventilation syndrome

## 2013-04-08 NOTE — Assessment & Plan Note (Signed)
We do not have a formal diagnosis from her psychiatrist, but with the fixed delusions, I would assume schizophrenia.

## 2013-04-09 ENCOUNTER — Telehealth: Payer: Self-pay | Admitting: Cardiology

## 2013-04-09 DIAGNOSIS — E785 Hyperlipidemia, unspecified: Secondary | ICD-10-CM

## 2013-04-09 DIAGNOSIS — Z79899 Other long term (current) drug therapy: Secondary | ICD-10-CM

## 2013-04-09 DIAGNOSIS — I10 Essential (primary) hypertension: Secondary | ICD-10-CM

## 2013-04-09 NOTE — Telephone Encounter (Signed)
Pt states she will come here on Friday for lab drawn.

## 2013-04-09 NOTE — Telephone Encounter (Signed)
New message     The lab report someone gave her is from feb 2014 not 2015. There supervisor at the cancer center recommend pt get this years labs drawn [facility].  She want her cholesterol drawn.  However, she want to go to [facility].  pls fax lab order to [facility]

## 2013-04-13 ENCOUNTER — Other Ambulatory Visit: Payer: Medicare HMO

## 2013-04-16 ENCOUNTER — Other Ambulatory Visit: Payer: Self-pay | Admitting: Family Medicine

## 2013-04-16 DIAGNOSIS — R519 Headache, unspecified: Secondary | ICD-10-CM

## 2013-04-16 DIAGNOSIS — R51 Headache: Principal | ICD-10-CM

## 2013-04-17 ENCOUNTER — Ambulatory Visit
Admission: RE | Admit: 2013-04-17 | Discharge: 2013-04-17 | Disposition: A | Discharge status: Home / Self Care | Payer: Medicare HMO | Source: Ambulatory Visit | Attending: Family Medicine | Admitting: Family Medicine

## 2013-04-17 DIAGNOSIS — R51 Headache: Principal | ICD-10-CM

## 2013-04-17 DIAGNOSIS — R519 Headache, unspecified: Secondary | ICD-10-CM

## 2013-04-20 ENCOUNTER — Other Ambulatory Visit: Payer: Medicare HMO

## 2013-04-20 ENCOUNTER — Telehealth: Payer: Self-pay | Admitting: Internal Medicine

## 2013-04-20 MED ORDER — DOXYCYCLINE HYCLATE 100 MG PO TABS: ORAL_TABLET | ORAL | Status: DC

## 2013-04-20 NOTE — Telephone Encounter (Signed)
Offer doxycycline 100 mg, # 8, 2 today then one daily 

## 2013-04-20 NOTE — Telephone Encounter (Signed)
rx sent and pt is aware. Meiko Stranahan, CMA  

## 2013-04-20 NOTE — Telephone Encounter (Signed)
Last OV 03-13-13.  Pt is c/o having a sore throat and a lump on the side of her throat that she thinks is a swollen gland. Pt states she has had this in the past and CY has prescribed her amoxicillin and this has helped.Pt is asking for another RX. Please advise. [place] Bing, CMA No Known Allergies

## 2013-04-20 NOTE — Telephone Encounter (Signed)
Pt is aware that we are still awaiting CY's response.

## 2013-04-20 NOTE — Telephone Encounter (Signed)
Attempted to call. Line is busy. Will try back.

## 2013-04-20 NOTE — Telephone Encounter (Signed)
Patient calling back stating her primary care doctor rx'd amoxicillin, but she states this does not work for her.  So, she is not going to pick up rx.  Still requesting rx for abx from CY.

## 2013-04-23 ENCOUNTER — Other Ambulatory Visit (HOSPITAL_COMMUNITY): Payer: Self-pay | Admitting: Otolaryngology

## 2013-04-23 DIAGNOSIS — D37039 Neoplasm of uncertain behavior of the major salivary glands, unspecified: Secondary | ICD-10-CM

## 2013-04-24 ENCOUNTER — Ambulatory Visit (HOSPITAL_COMMUNITY): Payer: Medicare HMO

## 2013-04-25 ENCOUNTER — Other Ambulatory Visit (HOSPITAL_COMMUNITY): Payer: Medicare HMO

## 2013-04-26 ENCOUNTER — Encounter (HOSPITAL_COMMUNITY): Payer: Self-pay

## 2013-04-26 ENCOUNTER — Telehealth: Payer: Self-pay | Admitting: Internal Medicine

## 2013-04-26 ENCOUNTER — Ambulatory Visit (HOSPITAL_COMMUNITY)
Admission: RE | Admit: 2013-04-26 | Discharge: 2013-04-26 | Disposition: A | Discharge status: Home / Self Care | Payer: Medicare HMO | Source: Ambulatory Visit | Attending: Otolaryngology | Admitting: Otolaryngology

## 2013-04-26 DIAGNOSIS — Z853 Personal history of malignant neoplasm of breast: Secondary | ICD-10-CM | POA: Insufficient documentation

## 2013-04-26 DIAGNOSIS — R599 Enlarged lymph nodes, unspecified: Secondary | ICD-10-CM | POA: Insufficient documentation

## 2013-04-26 DIAGNOSIS — R220 Localized swelling, mass and lump, head: Secondary | ICD-10-CM | POA: Insufficient documentation

## 2013-04-26 DIAGNOSIS — D4989 Neoplasm of unspecified behavior of other specified sites: Secondary | ICD-10-CM | POA: Insufficient documentation

## 2013-04-26 DIAGNOSIS — R221 Localized swelling, mass and lump, neck: Principal | ICD-10-CM

## 2013-04-26 DIAGNOSIS — D37039 Neoplasm of uncertain behavior of the major salivary glands, unspecified: Secondary | ICD-10-CM

## 2013-04-26 MED ORDER — IOHEXOL 300 MG/ML  SOLN
100.0000 mL | Freq: Once | INTRAMUSCULAR | Status: AC | PRN
  Administered 2013-04-26: 100 mL via INTRAVENOUS

## 2013-04-26 NOTE — Telephone Encounter (Signed)
Spoke with patient-she wanted to let CY know she had CT neck done today and wanted him to see results. Pt aware she will need to get official results from Dr Erik Obey. Will forward to CY as FYI.

## 2013-04-27 ENCOUNTER — Telehealth: Payer: Self-pay | Admitting: Internal Medicine

## 2013-04-27 NOTE — Telephone Encounter (Signed)
I spoke with the [name] and she states she had a CT of her neck by Dr. Erik Obey and they called her this morning and advised she needs to come in to discuss and she is set to go this afternoon but she was wanting to know if Dr. Annamaria Boots could give her the results sooner. I advised the results will have to come from the ordering doc so she should keep appt. [name] states understanding.  Bing, CMA

## 2013-05-02 ENCOUNTER — Other Ambulatory Visit: Payer: Self-pay | Admitting: Otolaryngology

## 2013-05-02 ENCOUNTER — Other Ambulatory Visit (HOSPITAL_COMMUNITY)
Admission: RE | Admit: 2013-05-02 | Discharge: 2013-05-02 | Disposition: A | Discharge status: Home / Self Care | Payer: Medicare HMO | Source: Ambulatory Visit | Attending: Otolaryngology | Admitting: Otolaryngology

## 2013-05-02 DIAGNOSIS — R609 Edema, unspecified: Secondary | ICD-10-CM | POA: Insufficient documentation

## 2013-05-07 ENCOUNTER — Telehealth: Payer: Self-pay | Admitting: Internal Medicine

## 2013-05-07 NOTE — Telephone Encounter (Signed)
Called and spoke with pt. She reports Dr. Erik Obey wants to do surgery on her. She was dx with lymphoma. She wants to know if the oxygen could have worsened this? Also could this be causing her cough. She has been doing research on this and seen some people only have 4 months left to live. She reports her dentists and Dr. Erik Obey advised her he did not think this caused it either. She wants an answer from Dr. Annamaria Boots Please advise Dr. Annamaria Boots thanks

## 2013-05-08 ENCOUNTER — Encounter (HOSPITAL_COMMUNITY): Payer: Self-pay | Admitting: Pharmacy Technician

## 2013-05-08 NOTE — Telephone Encounter (Signed)
lmomtcb x1 

## 2013-05-08 NOTE — Telephone Encounter (Signed)
Im sorry she has lymphoma. Oxygen did not cause it or make it worse.

## 2013-05-09 ENCOUNTER — Encounter (HOSPITAL_COMMUNITY)
Admission: RE | Admit: 2013-05-09 | Discharge: 2013-05-09 | Disposition: A | Discharge status: Home / Self Care | Payer: Medicare HMO | Source: Ambulatory Visit | Attending: Otolaryngology | Admitting: Otolaryngology

## 2013-05-09 ENCOUNTER — Other Ambulatory Visit: Payer: Self-pay | Admitting: Otolaryngology

## 2013-05-09 ENCOUNTER — Encounter (HOSPITAL_COMMUNITY): Payer: Self-pay

## 2013-05-09 ENCOUNTER — Telehealth: Payer: Self-pay | Admitting: Hematology and Oncology

## 2013-05-09 ENCOUNTER — Ambulatory Visit (HOSPITAL_COMMUNITY)
Admission: RE | Admit: 2013-05-09 | Discharge: 2013-05-09 | Disposition: A | Discharge status: Home / Self Care | Payer: Medicare HMO | Source: Ambulatory Visit | Attending: Anesthesiology | Admitting: Anesthesiology

## 2013-05-09 DIAGNOSIS — Z01818 Encounter for other preprocedural examination: Secondary | ICD-10-CM | POA: Insufficient documentation

## 2013-05-09 DIAGNOSIS — I10 Essential (primary) hypertension: Secondary | ICD-10-CM | POA: Insufficient documentation

## 2013-05-09 DIAGNOSIS — Z01812 Encounter for preprocedural laboratory examination: Secondary | ICD-10-CM

## 2013-05-09 DIAGNOSIS — Z01810 Encounter for preprocedural cardiovascular examination: Secondary | ICD-10-CM | POA: Insufficient documentation

## 2013-05-09 DIAGNOSIS — Z853 Personal history of malignant neoplasm of breast: Secondary | ICD-10-CM | POA: Insufficient documentation

## 2013-05-09 DIAGNOSIS — G4730 Sleep apnea, unspecified: Secondary | ICD-10-CM | POA: Insufficient documentation

## 2013-05-09 HISTORY — DX: Gastro-esophageal reflux disease without esophagitis: K21.9

## 2013-05-09 HISTORY — DX: Personal history of colonic polyps: Z86.010

## 2013-05-09 HISTORY — DX: Personal history of colon polyps, unspecified: Z86.0100

## 2013-05-09 HISTORY — DX: Low back pain, unspecified: M54.50

## 2013-05-09 HISTORY — DX: Low back pain: M54.5

## 2013-05-09 LAB — BASIC METABOLIC PANEL
BUN: 20 mg/dL (ref 6–23)
CO2: 22 meq/L (ref 19–32)
Calcium: 9.1 mg/dL (ref 8.4–10.5)
Chloride: 100 mEq/L (ref 96–112)
Creatinine, Ser: 0.64 mg/dL (ref 0.50–1.10)
GFR calc Af Amer: >90 mL/min (ref >90)
GFR calc non Af Amer: 85 mL/min — ABNORMAL LOW (ref >90)
Glucose, Bld: 137 mg/dL — ABNORMAL HIGH (ref 70–99)
Potassium: 5.4 mEq/L — ABNORMAL HIGH (ref 3.7–5.3)
SODIUM: 136 meq/L — AB (ref 137–147)

## 2013-05-09 LAB — CBC
HCT: 39.2 % (ref 36.0–46.0)
HEMOGLOBIN: 13.2 g/dL (ref 12.0–15.0)
MCH: 27.1 pg (ref 26.0–34.0)
MCHC: 33.7 g/dL (ref 30.0–36.0)
MCV: 80.5 fL (ref 78.0–100.0)
Platelets: 307 10*3/uL (ref 150–400)
RBC: 4.87 MIL/uL (ref 3.87–5.11)
RDW: 16.6 % — ABNORMAL HIGH (ref 11.5–15.5)
WBC: 11.2 10*3/uL — AB (ref 4.0–10.5)

## 2013-05-09 NOTE — Telephone Encounter (Signed)
S/W PATIENT AND GAVE DR. Alvy Bimler AS HER NEW MD AND SHE WOULD SPEAK WITH DR. Erik Obey.

## 2013-05-09 NOTE — Telephone Encounter (Signed)
LMTCBx2. Jennifer Castillo, CMA  

## 2013-05-09 NOTE — Progress Notes (Addendum)
Dr.Crenshaw is cardiologist with last visit in Feb 2015.Sees on a yearly basis  Echo report in epic from 2008  Heart cath report in epic from 2012   Medical Md is Dr.Charles Harrington Challenger with [facility]  EKG in epic from 03-22-13  Sleep study in epic from 2012-uses O2 @ 3L/m via n/c instead of CPAP  Unsure when stress test was done but thinks around 2008 when echo

## 2013-05-09 NOTE — H&P (Signed)
[name],  [place] 78 y.o., female [id number]     Chief Complaint:  LEFT upper  neck mass   HPI: 18 months recheck for this now 78 year old white female.  Her ears have been feeling good, so she did not come back for cleaning.  She has had slight itching in the right ear for the past month.  She tried to scratch it with a bobby pin  and wonders if she may have damaged it.  No bleeding.  She has had some recent headaches.  A CT scan of the head was performed which was read as showing chronic mastoiditis on the RIGHT.  No recent ear infections nor any drainage from the ears.  I reviewed the CT scans.  She has a mastoid cavity with some debris on the LEFT side.  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   She has noticed some fullness and slight tenderness in the LEFT upper neck.  No obvious swelling with meals.  No past history of salivary stones or infections.  One-week recheck for needle aspiration of the LEFT submandibular lymph node.  A CT scan of the neck showed normal submandibular glands and no sign of stones.  No other significant lymphadenopathy.     She has described some hoarseness recently.  Her remaining teeth are in good repair.  No mouth ulcers or sores.  No pain in her throat.  No difficulty breathing or swallowing.  She has had some mild hoarseness.  Preoperative visit.  Her needle aspiration suggests a B cell lymphoma.  In conversation with the medical oncologists,  they would like a more complete tissue biopsy.   We discussed the surgery, namely LEFT submandibular lymph node removal.  Details were shared.  Risks and complications were discussed.  Questions were answered and informed consent was obtained.  We will observe for 23 hours extended recovery given her sleep apnea.  She will have a prescription for hydrocodone for pain relief.  We discussed advancement of diet and activity.  She has numerous questions about prognosis and  treatment which qualified to discuss and which are premature until we have a more definitive diagnosis.  PMH: Past Medical History  Diagnosis Date  . Sleep apnea     insomnia  . Hyperlipidemia     takes Atorvastatin daily  . Chronic headache   . Osteoarthritis   . CAD (coronary artery disease)   . Chronic chest pain   . Constipation   . Malignant neoplasm of breast     right breast  . Hypertension     takes Quinapril daily  . Joint pain   . Low back pain   . Constipation     takes Miralax daily  . GERD (gastroesophageal reflux disease)     takes Nexium daily  . History of colon polyps   . Anxiety     takes Ativan daily  . Panic attacks   . Depression     takes Elavil nightly    Surg Hx: Past Surgical History  Procedure Laterality Date  . Cholecystectomy    . Right mastectomy    . Tonsillectomy    . Knee surgery Right   . US echocardiography  10/04/2006    EF 55-60%  . Heart stent  May 2011 per pt  . Portacath placement      x 2  . Inner ear surgery    . Cardiac catheterization  2012  . Port removed    .  Esophagogastroduodenoscopy    . Colonoscopy    . Cataract surgery Bilateral     FHx:  No family history on file. SocHx:  reports that she has never smoked. She has never used smokeless tobacco. She reports that she does not drink alcohol or use illicit drugs.  ALLERGIES: No Known Allergies   (Not in a hospital admission)  Results for orders placed during the hospital encounter of 05/09/13 (from the past 48 hour(s))  BASIC METABOLIC PANEL     Status: Abnormal   Collection Time    05/09/13  1:30 PM      Result Value Ref Range   Sodium 136 (*) 137 - 147 mEq/L   Potassium 5.4 (*) 3.7 - 5.3 mEq/L   Comment: HEMOLYSIS AT THIS LEVEL MAY AFFECT RESULT     SLIGHT HEMOLYSIS   Chloride 100  96 - 112 mEq/L   CO2 22  19 - 32 mEq/L   Glucose, Bld 137 (*) 70 - 99 mg/dL   BUN 20  6 - 23 mg/dL   Creatinine, Ser 0.64  0.50 - 1.10 mg/dL   Calcium 9.1  8.4 - 10.5  mg/dL   GFR calc non Af Amer 85 (*) >90 mL/min   GFR calc Af Amer >90  >90 mL/min   Comment: (NOTE)     The eGFR has been calculated using the CKD EPI equation.     This calculation has not been validated in all clinical situations.     eGFR's persistently <90 mL/min signify possible Chronic Kidney     Disease.  CBC     Status: Abnormal   Collection Time    05/09/13  1:30 PM      Result Value Ref Range   WBC 11.2 (*) 4.0 - 10.5 K/uL   Comment: WHITE COUNT CONFIRMED ON SMEAR   RBC 4.87  3.87 - 5.11 MIL/uL   Hemoglobin 13.2  12.0 - 15.0 g/dL   HCT 39.2  36.0 - 46.0 %   MCV 80.5  78.0 - 100.0 fL   MCH 27.1  26.0 - 34.0 pg   MCHC 33.7  30.0 - 36.0 g/dL   RDW 16.6 (*) 11.5 - 15.5 %   Platelets 307  150 - 400 K/uL   Dg Chest 2 View  05/09/2013   CLINICAL DATA:  Preoperative prior to neck surgery ; history of breast malignancy, hypertension, and sleep apnea  EXAM: CHEST  2 VIEW  COMPARISON:  DG ABD ACUTE W/CHEST dated 09/19/2012; DG CHEST 1 VIEW dated 04/27/2012; DG CHEST 1V PORT dated 03/14/2011  FINDINGS: The lungs are well-expanded. There is no focal infiltrate on the frontal film. The middle interstitial markings are minimally prominent on the frontal film, but on the lateral film increased density is demonstrated over the lungs which is likely due to summation effects. He had when compared to the January 2013 film the interstitial markings have in fact increased bilaterally. There is no pleural effusion. The cardiac silhouette is top-normal in size. The pulmonary vascularity is not engorged.  IMPRESSION: There is no evidence of pneumonia nor CHF. Mild prominence of the pulmonary interstitial markings is present and signed report is nonspecific. These have developed since the study of March 14, 2011.   Electronically Signed   By: David  Martinique   On: 05/09/2013 14:55     PHYSICAL EXAM: She is sitting in a wheelchair.  Mental status is basically intact although she has multiple repetitions of  similar questions.  There is a  2 cm rubbery mobile LEFT submandibular node.  LEFT facial nerve is intact including ramus mandibularis.  No other adenopathy.     Lungs: Clear to auscultation Heart: Regular rate and rhythm without murmur Abdomen: Soft, active Extremities:  normal configuration Neurologic: Symmetric, grossly intact.  With flexible laryngoscopy, the nasopharynx is clear with normal eustachian tori.  Oropharynx is clear with normal base of tongue.  Hypopharynx shows mobile vocal cords and no significant erythema or swelling.  No pooling in valleculae or piriforms.  Studies Reviewed:  CT scan of the neck with contrast done earlier today shows an enlarged LEFTsubmandibular node.  The submandibular glands are normal on both sides.  no evidence of stones.  I will call and share this with her.  we will have her come back in for fine needle aspiration for cytology.  If this is lymphoid, she will required node excision for lymphoma evaluation.    Assessment/Plan Lymphadenopathy, cervical (785.6) (R59.0).  We are  on schedule to remove the lump from your LEFT neck on Thursday.  Depending on the decision of the medical oncologist, we may be able to cancel this surgery.  Meanwhile we are making plans to proceed.  Teeth your preoperative appointment tomorrow.  Stop your baby aspirin now and resume after the surgery.  you will stay in the hospital one night after your surgery because of your sleep apnea.  I am leaving you a prescription for hydrocodone for pain relief.  I will plan on seeing you back 10 days after surgery.  Buy a small bottle of Hibiclens (chlorhexidine)  antibacterial liquids soap and wash your LEFT neck with this Wednesday night and Thursday morning.  Hydrocodone-Acetaminophen 5-325 MG Oral Tablet;1-2 po q4h prn pain; [id number]; R0; Rx.  Jodi Marble 09/03/7216, 6:16 PM

## 2013-05-09 NOTE — Pre-Procedure Instructions (Signed)
[name]  05/09/2013   Your procedure is scheduled on:  Thurs, Mar 26 @ 10:30 AM  Report to [facility] at 8:30 AM.  Call this number if you have problems the morning of surgery: [phone]   Remember:   Do not eat food or drink liquids after midnight.   Take these medicines the morning of surgery with A SIP OF WATER: Arimidex(Anastrozole) and Ativan(Lorazepam)             No Goody's,BC's,Aleve,Aspirin,Ibuprofen,Fish Oil,or any Herbal Medications   Do not wear jewelry, make-up or nail polish.  Do not wear lotions, powders, or perfumes. You may wear deodorant.  Do not shave 48 hours prior to surgery.   Do not bring valuables to the hospital.  [facility] is not responsible                  for any belongings or valuables.               Contacts, dentures or bridgework may not be worn into surgery.  Leave suitcase in the car. After surgery it may be brought to your room.  For patients admitted to the hospital, discharge time is determined by your                treatment team.               Patients discharged the day of surgery will not be allowed to drive  home.    Special Instructions:  [facility] - Preparing for Surgery  Before surgery, you can play an important role.  Because skin is not sterile, your skin needs to be as free of germs as possible.  You can reduce the number of germs on you skin by washing with CHG (chlorahexidine gluconate) soap before surgery.  CHG is an antiseptic cleaner which kills germs and bonds with the skin to continue killing germs even after washing.  Please DO NOT use if you have an allergy to CHG or antibacterial soaps.  If your skin becomes reddened/irritated stop using the CHG and inform your nurse when you arrive at Short Stay.  Do not shave (including legs and underarms) for at least 48 hours prior to the first CHG shower.  You may shave your face.  Please follow these instructions carefully:   1.  Shower with CHG Soap the night  before surgery and the                                morning of Surgery.  2.  If you choose to wash your hair, wash your hair first as usual with your       normal shampoo.  3.  After you shampoo, rinse your hair and body thoroughly to remove the                      Shampoo.  4.  Use CHG as you would any other liquid soap.  You can apply chg directly       to the skin and wash gently with scrungie or a clean washcloth.  5.  Apply the CHG Soap to your body ONLY FROM THE NECK DOWN.        Do not use on open wounds or open sores.  Avoid contact with your eyes,       ears, mouth and genitals (private parts).  Wash  genitals (private parts)       with your normal soap.  6.  Wash thoroughly, paying special attention to the area where your surgery        will be performed.  7.  Thoroughly rinse your body with warm water from the neck down.  8.  DO NOT shower/wash with your normal soap after using and rinsing off       the CHG Soap.  9.  Pat yourself dry with a clean towel.            10.  Wear clean pajamas.            11.  Place clean sheets on your bed the night of your first shower and do not        sleep with pets.  Day of Surgery  Do not apply any lotions/deoderants the morning of surgery.  Please wear clean clothes to the hospital/surgery center.     Please read over the following fact sheets that you were given: Pain Booklet, Coughing and Deep Breathing and Surgical Site Infection Prevention

## 2013-05-10 ENCOUNTER — Encounter (HOSPITAL_COMMUNITY)
Admission: RE | Disposition: A | Discharge status: Home / Self Care | Payer: Self-pay | Source: Ambulatory Visit | Attending: Otolaryngology

## 2013-05-10 ENCOUNTER — Observation Stay (HOSPITAL_COMMUNITY)
Admission: RE | Admit: 2013-05-10 | Discharge: 2013-05-11 | Disposition: A | Discharge status: Home / Self Care | Payer: Medicare HMO | Source: Ambulatory Visit | Attending: Otolaryngology | Admitting: Otolaryngology

## 2013-05-10 ENCOUNTER — Encounter (HOSPITAL_COMMUNITY): Payer: Medicare HMO | Admitting: Certified Registered Nurse Anesthetist

## 2013-05-10 ENCOUNTER — Ambulatory Visit (HOSPITAL_COMMUNITY): Payer: Medicare HMO | Admitting: Certified Registered Nurse Anesthetist

## 2013-05-10 ENCOUNTER — Encounter (HOSPITAL_COMMUNITY): Payer: Self-pay

## 2013-05-10 ENCOUNTER — Observation Stay (HOSPITAL_COMMUNITY): Payer: Medicare HMO

## 2013-05-10 DIAGNOSIS — K219 Gastro-esophageal reflux disease without esophagitis: Secondary | ICD-10-CM | POA: Insufficient documentation

## 2013-05-10 DIAGNOSIS — G4733 Obstructive sleep apnea (adult) (pediatric): Secondary | ICD-10-CM | POA: Insufficient documentation

## 2013-05-10 DIAGNOSIS — I10 Essential (primary) hypertension: Secondary | ICD-10-CM | POA: Insufficient documentation

## 2013-05-10 DIAGNOSIS — Z01812 Encounter for preprocedural laboratory examination: Secondary | ICD-10-CM | POA: Insufficient documentation

## 2013-05-10 DIAGNOSIS — I2510 Atherosclerotic heart disease of native coronary artery without angina pectoris: Secondary | ICD-10-CM | POA: Insufficient documentation

## 2013-05-10 DIAGNOSIS — Z8572 Personal history of non-Hodgkin lymphomas: Secondary | ICD-10-CM | POA: Diagnosis present

## 2013-05-10 DIAGNOSIS — C8581 Other specified types of non-Hodgkin lymphoma, lymph nodes of head, face, and neck: Principal | ICD-10-CM | POA: Insufficient documentation

## 2013-05-10 DIAGNOSIS — Z01818 Encounter for other preprocedural examination: Secondary | ICD-10-CM | POA: Insufficient documentation

## 2013-05-10 DIAGNOSIS — F411 Generalized anxiety disorder: Secondary | ICD-10-CM | POA: Insufficient documentation

## 2013-05-10 DIAGNOSIS — F3289 Other specified depressive episodes: Secondary | ICD-10-CM | POA: Insufficient documentation

## 2013-05-10 DIAGNOSIS — F329 Major depressive disorder, single episode, unspecified: Secondary | ICD-10-CM | POA: Insufficient documentation

## 2013-05-10 HISTORY — PX: SUBMANDIBULAR GLAND EXCISION: SHX2456

## 2013-05-10 SURGERY — EXCISION, SUBMANDIBULAR GLAND
Anesthesia: General | Site: Neck | Laterality: Left

## 2013-05-10 MED ORDER — LIDOCAINE HCL (CARDIAC) 20 MG/ML IV SOLN
INTRAVENOUS | Status: DC | PRN
  Administered 2013-05-10: 20 mg via INTRAVENOUS

## 2013-05-10 MED ORDER — HYDROCODONE-ACETAMINOPHEN 5-325 MG PO TABS
ORAL_TABLET | ORAL | Status: AC
  Filled 2013-05-10: qty 1

## 2013-05-10 MED ORDER — ATORVASTATIN CALCIUM 40 MG PO TABS: 40.0000 mg | ORAL_TABLET | Freq: Every day | ORAL | Status: DC

## 2013-05-10 MED ORDER — AMITRIPTYLINE HCL 100 MG PO TABS
100.0000 mg | ORAL_TABLET | Freq: Every day | ORAL | Status: DC
  Administered 2013-05-10: 100 mg via ORAL
  Filled 2013-05-10 (×2): qty 1

## 2013-05-10 MED ORDER — LORAZEPAM 1 MG PO TABS
2.0000 mg | ORAL_TABLET | Freq: Four times a day (QID) | ORAL | Status: DC | PRN
  Administered 2013-05-10 – 2013-05-11 (×2): 2 mg via ORAL
  Filled 2013-05-10 (×2): qty 2

## 2013-05-10 MED ORDER — CHLORHEXIDINE GLUCONATE 4 % EX LIQD
1.0000 "application " | Freq: Once | CUTANEOUS | Status: DC
  Filled 2013-05-10: qty 15

## 2013-05-10 MED ORDER — LIDOCAINE HCL (CARDIAC) 20 MG/ML IV SOLN
INTRAVENOUS | Status: AC
  Filled 2013-05-10: qty 5

## 2013-05-10 MED ORDER — ONDANSETRON HCL 4 MG/2ML IJ SOLN
INTRAMUSCULAR | Status: AC
  Filled 2013-05-10: qty 2

## 2013-05-10 MED ORDER — PANTOPRAZOLE SODIUM 40 MG PO TBEC
40.0000 mg | DELAYED_RELEASE_TABLET | Freq: Every day | ORAL | Status: DC
Start: 2013-05-10 — End: 2013-05-11
  Administered 2013-05-10 – 2013-05-11 (×2): 40 mg via ORAL
  Filled 2013-05-10 (×2): qty 1

## 2013-05-10 MED ORDER — ARTIFICIAL TEARS OP OINT
TOPICAL_OINTMENT | OPHTHALMIC | Status: AC
  Filled 2013-05-10: qty 3.5

## 2013-05-10 MED ORDER — NEOSTIGMINE METHYLSULFATE 1 MG/ML IJ SOLN
INTRAMUSCULAR | Status: DC | PRN
  Administered 2013-05-10: 3 mg via INTRAVENOUS

## 2013-05-10 MED ORDER — LIDOCAINE-EPINEPHRINE 1 %-1:100000 IJ SOLN
INTRAMUSCULAR | Status: DC | PRN
  Administered 2013-05-10: 6 mL

## 2013-05-10 MED ORDER — GLYCOPYRROLATE 0.2 MG/ML IJ SOLN
INTRAMUSCULAR | Status: DC | PRN
  Administered 2013-05-10: 0.4 mg via INTRAVENOUS

## 2013-05-10 MED ORDER — LIDOCAINE-EPINEPHRINE 1 %-1:100000 IJ SOLN
INTRAMUSCULAR | Status: AC
  Filled 2013-05-10: qty 1

## 2013-05-10 MED ORDER — SODIUM CHLORIDE 0.45 % IV SOLN
INTRAVENOUS | Status: DC
Start: 2013-05-10 — End: 2013-05-11
  Administered 2013-05-10 – 2013-05-11 (×2): via INTRAVENOUS

## 2013-05-10 MED ORDER — ONDANSETRON HCL 4 MG/2ML IJ SOLN: 4.0000 mg | INTRAMUSCULAR | Status: DC | PRN

## 2013-05-10 MED ORDER — ARTIFICIAL TEARS OP OINT
TOPICAL_OINTMENT | OPHTHALMIC | Status: DC | PRN
  Administered 2013-05-10: 1 via OPHTHALMIC

## 2013-05-10 MED ORDER — MENTHOL 3 MG MT LOZG
LOZENGE | OROMUCOSAL | Status: AC
  Filled 2013-05-10: qty 9

## 2013-05-10 MED ORDER — FENTANYL CITRATE 0.05 MG/ML IJ SOLN
INTRAMUSCULAR | Status: AC
  Filled 2013-05-10: qty 5

## 2013-05-10 MED ORDER — HEPARIN SODIUM (PORCINE) 5000 UNIT/ML IJ SOLN
5000.0000 [IU] | Freq: Three times a day (TID) | INTRAMUSCULAR | Status: DC
  Administered 2013-05-10 – 2013-05-11 (×2): 5000 [IU] via SUBCUTANEOUS
  Filled 2013-05-10 (×5): qty 1

## 2013-05-10 MED ORDER — FENTANYL CITRATE 0.05 MG/ML IJ SOLN
INTRAMUSCULAR | Status: DC | PRN
  Administered 2013-05-10: 50 ug via INTRAVENOUS

## 2013-05-10 MED ORDER — PROPOFOL 10 MG/ML IV BOLUS
INTRAVENOUS | Status: AC
  Filled 2013-05-10: qty 20

## 2013-05-10 MED ORDER — LACTATED RINGERS IV SOLN: INTRAVENOUS | Status: DC

## 2013-05-10 MED ORDER — HYDROCODONE-ACETAMINOPHEN 5-325 MG PO TABS
1.0000 | ORAL_TABLET | ORAL | Status: DC | PRN
  Administered 2013-05-10 (×2): 1 via ORAL
  Administered 2013-05-11: 2 via ORAL
  Filled 2013-05-10: qty 2
  Filled 2013-05-10: qty 1

## 2013-05-10 MED ORDER — ROCURONIUM BROMIDE 100 MG/10ML IV SOLN
INTRAVENOUS | Status: DC | PRN
  Administered 2013-05-10: 30 mg via INTRAVENOUS

## 2013-05-10 MED ORDER — ANASTROZOLE 1 MG PO TABS
1.0000 mg | ORAL_TABLET | Freq: Every day | ORAL | Status: DC
  Administered 2013-05-10 – 2013-05-11 (×2): 1 mg via ORAL
  Filled 2013-05-10 (×2): qty 1

## 2013-05-10 MED ORDER — ONDANSETRON HCL 4 MG/2ML IJ SOLN
INTRAMUSCULAR | Status: DC | PRN
  Administered 2013-05-10: 4 mg via INTRAVENOUS

## 2013-05-10 MED ORDER — MIDAZOLAM HCL 2 MG/2ML IJ SOLN
INTRAMUSCULAR | Status: AC
  Filled 2013-05-10: qty 2

## 2013-05-10 MED ORDER — QUINAPRIL HCL 10 MG PO TABS
20.0000 mg | ORAL_TABLET | Freq: Every day | ORAL | Status: DC
  Administered 2013-05-10 – 2013-05-11 (×2): 20 mg via ORAL
  Filled 2013-05-10 (×2): qty 2

## 2013-05-10 MED ORDER — BACITRACIN ZINC 500 UNIT/GM EX OINT
TOPICAL_OINTMENT | CUTANEOUS | Status: AC
  Filled 2013-05-10: qty 15

## 2013-05-10 MED ORDER — ONDANSETRON HCL 4 MG PO TABS: 4.0000 mg | ORAL_TABLET | ORAL | Status: DC | PRN

## 2013-05-10 MED ORDER — LACTATED RINGERS IV SOLN
INTRAVENOUS | Status: DC | PRN
  Administered 2013-05-10: 11:00:00 via INTRAVENOUS

## 2013-05-10 MED ORDER — ROCURONIUM BROMIDE 50 MG/5ML IV SOLN
INTRAVENOUS | Status: AC
  Filled 2013-05-10: qty 1

## 2013-05-10 MED ORDER — NEOSTIGMINE METHYLSULFATE 1 MG/ML IJ SOLN
INTRAMUSCULAR | Status: AC
  Filled 2013-05-10: qty 10

## 2013-05-10 MED ORDER — ATORVASTATIN CALCIUM 40 MG PO TABS
40.0000 mg | ORAL_TABLET | Freq: Every day | ORAL | Status: DC
  Administered 2013-05-10: 40 mg via ORAL
  Filled 2013-05-10 (×2): qty 1

## 2013-05-10 MED ORDER — 0.9 % SODIUM CHLORIDE (POUR BTL) OPTIME
TOPICAL | Status: DC | PRN
  Administered 2013-05-10: 1000 mL

## 2013-05-10 MED ORDER — PROPOFOL 10 MG/ML IV BOLUS
INTRAVENOUS | Status: DC | PRN
  Administered 2013-05-10: 110 mg via INTRAVENOUS

## 2013-05-10 MED ORDER — FENTANYL CITRATE 0.05 MG/ML IJ SOLN: 25.0000 ug | INTRAMUSCULAR | Status: DC | PRN

## 2013-05-10 MED ORDER — GLYCOPYRROLATE 0.2 MG/ML IJ SOLN
INTRAMUSCULAR | Status: AC
  Filled 2013-05-10: qty 2

## 2013-05-10 MED ORDER — NITROGLYCERIN 0.4 MG SL SUBL: 0.4000 mg | SUBLINGUAL_TABLET | SUBLINGUAL | Status: DC | PRN

## 2013-05-10 SURGICAL SUPPLY — 54 items
ADH SKN CLS APL DERMABOND .7 (GAUZE/BANDAGES/DRESSINGS) ×1
ATTRACTOMAT 16X20 MAGNETIC DRP (DRAPES) IMPLANT
BLADE SURG 15 STRL LF DISP TIS (BLADE) IMPLANT
BLADE SURG 15 STRL SS (BLADE)
BLADE SURG ROTATE 9660 (MISCELLANEOUS) IMPLANT
CANISTER SUCTION 2500CC (MISCELLANEOUS) ×3 IMPLANT
CLEANER TIP ELECTROSURG 2X2 (MISCELLANEOUS) ×3 IMPLANT
CONT SPEC 4OZ CLIKSEAL STRL BL (MISCELLANEOUS) ×3 IMPLANT
CORDS BIPOLAR (ELECTRODE) IMPLANT
COVER SURGICAL LIGHT HANDLE (MISCELLANEOUS) ×3 IMPLANT
CRADLE DONUT ADULT HEAD (MISCELLANEOUS) IMPLANT
DERMABOND ADVANCED (GAUZE/BANDAGES/DRESSINGS) ×2
DERMABOND ADVANCED .7 DNX12 (GAUZE/BANDAGES/DRESSINGS) IMPLANT
DRAIN SNY 10 ROU (WOUND CARE) ×1 IMPLANT
ELECT COATED BLADE 2.86 ST (ELECTRODE) ×3 IMPLANT
ELECT PAIRED SUBDERMAL (MISCELLANEOUS)
ELECT REM PT RETURN 9FT ADLT (ELECTROSURGICAL) ×3
ELECTRODE PAIRED SUBDERMAL (MISCELLANEOUS) IMPLANT
ELECTRODE REM PT RTRN 9FT ADLT (ELECTROSURGICAL) ×1 IMPLANT
EVACUATOR SILICONE 100CC (DRAIN) IMPLANT
FORCEPS TISS BAYO ENTCEPS (INSTRUMENTS) ×2 IMPLANT
GAUZE SPONGE 4X4 16PLY XRAY LF (GAUZE/BANDAGES/DRESSINGS) IMPLANT
GLOVE BIO SURGEON STRL SZ7 (GLOVE) ×2 IMPLANT
GLOVE BIOGEL PI IND STRL 6.5 (GLOVE) IMPLANT
GLOVE BIOGEL PI INDICATOR 6.5 (GLOVE) ×2
GLOVE ECLIPSE 6.5 STRL STRAW (GLOVE) ×2 IMPLANT
GLOVE ECLIPSE 8.0 STRL XLNG CF (GLOVE) ×3 IMPLANT
GOWN STRL REUS W/ TWL LRG LVL3 (GOWN DISPOSABLE) ×2 IMPLANT
GOWN STRL REUS W/ TWL XL LVL3 (GOWN DISPOSABLE) ×1 IMPLANT
GOWN STRL REUS W/TWL LRG LVL3 (GOWN DISPOSABLE) ×9
GOWN STRL REUS W/TWL XL LVL3 (GOWN DISPOSABLE) ×3
KIT BASIN OR (CUSTOM PROCEDURE TRAY) ×3 IMPLANT
KIT ROOM TURNOVER OR (KITS) ×3 IMPLANT
LOCATOR NERVE 3 VOLT (DISPOSABLE) IMPLANT
NDL HYPO 25GX1X1/2 BEV (NEEDLE) ×1 IMPLANT
NEEDLE HYPO 25GX1X1/2 BEV (NEEDLE) ×3 IMPLANT
NS IRRIG 1000ML POUR BTL (IV SOLUTION) ×3 IMPLANT
PAD ARMBOARD 7.5X6 YLW CONV (MISCELLANEOUS) ×6 IMPLANT
PENCIL BUTTON HOLSTER BLD 10FT (ELECTRODE) ×3 IMPLANT
PROBE NERVBE PRASS .33 (MISCELLANEOUS) IMPLANT
STAPLER VISISTAT 35W (STAPLE) ×3 IMPLANT
SUT CHROMIC 4 0 PS 2 18 (SUTURE) ×2 IMPLANT
SUT ETHILON 3 0 PS 1 (SUTURE) ×3 IMPLANT
SUT ETHILON 5 0 PS 2 18 (SUTURE) IMPLANT
SUT SILK 0 TIES 10X30 (SUTURE) IMPLANT
SUT SILK 3 0 REEL (SUTURE) ×2 IMPLANT
SUT SILK 4 0 REEL (SUTURE) IMPLANT
SYR CONTROL 10ML LL (SYRINGE) ×3 IMPLANT
TOWEL OR 17X24 6PK STRL BLUE (TOWEL DISPOSABLE) ×3 IMPLANT
TOWEL OR 17X26 10 PK STRL BLUE (TOWEL DISPOSABLE) ×3 IMPLANT
TRAY ENT MC OR (CUSTOM PROCEDURE TRAY) ×3 IMPLANT
TUBE CONNECTING 12'X1/4 (SUCTIONS)
TUBE CONNECTING 12X1/4 (SUCTIONS) IMPLANT
WATER STERILE IRR 1000ML POUR (IV SOLUTION) ×1 IMPLANT

## 2013-05-10 NOTE — H&P (View-Only) (Signed)
Haupt,  [place] 78 y.o., female [id number]     Chief Complaint:  LEFT upper  neck mass   HPI: 18 months recheck for this now 78 year old white female.  Her ears have been feeling good, so she did not come back for cleaning.  She has had slight itching in the right ear for the past month.  She tried to scratch it with a bobby pin  and wonders if she may have damaged it.  No bleeding.  She has had some recent headaches.  A CT scan of the head was performed which was read as showing chronic mastoiditis on the RIGHT.  No recent ear infections nor any drainage from the ears.  I reviewed the CT scans.  She has a mastoid cavity with some debris on the LEFT side.  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   She has noticed some fullness and slight tenderness in the LEFT upper neck.  No obvious swelling with meals.  No past history of salivary stones or infections.  One-week recheck for needle aspiration of the LEFT submandibular lymph node.  A CT scan of the neck showed normal submandibular glands and no sign of stones.  No other significant lymphadenopathy.     She has described some hoarseness recently.  Her remaining teeth are in good repair.  No mouth ulcers or sores.  No pain in her throat.  No difficulty breathing or swallowing.  She has had some mild hoarseness.  Preoperative visit.  Her needle aspiration suggests a B cell lymphoma.  In conversation with the medical oncologists,  they would like a more complete tissue biopsy.   We discussed the surgery, namely LEFT submandibular lymph node removal.  Details were shared.  Risks and complications were discussed.  Questions were answered and informed consent was obtained.  We will observe for 23 hours extended recovery given her sleep apnea.  She will have a prescription for hydrocodone for pain relief.  We discussed advancement of diet and activity.  She has numerous questions about prognosis and  treatment which qualified to discuss and which are premature until we have a more definitive diagnosis.  PMH: Past Medical History  Diagnosis Date  . Sleep apnea     insomnia  . Hyperlipidemia     takes Atorvastatin daily  . Chronic headache   . Osteoarthritis   . CAD (coronary artery disease)   . Chronic chest pain   . Constipation   . Malignant neoplasm of breast     right breast  . Hypertension     takes Quinapril daily  . Joint pain   . Low back pain   . Constipation     takes Miralax daily  . GERD (gastroesophageal reflux disease)     takes Nexium daily  . History of colon polyps   . Anxiety     takes Ativan daily  . Panic attacks   . Depression     takes Elavil nightly    Surg Hx: Past Surgical History  Procedure Laterality Date  . Cholecystectomy    . Right mastectomy    . Tonsillectomy    . Knee surgery Right   . US echocardiography  10/04/2006    EF 55-60%  . Heart stent  May 2011 per pt  . Portacath placement      x 2  . Inner ear surgery    . Cardiac catheterization  2012  . Port removed    .  Esophagogastroduodenoscopy    . Colonoscopy    . Cataract surgery Bilateral     FHx:  No family history on file. SocHx:  reports that she has never smoked. She has never used smokeless tobacco. She reports that she does not drink alcohol or use illicit drugs.  ALLERGIES: No Known Allergies   (Not in a hospital admission)  Results for orders placed during the hospital encounter of 05/09/13 (from the past 48 hour(s))  BASIC METABOLIC PANEL     Status: Abnormal   Collection Time    05/09/13  1:30 PM      Result Value Ref Range   Sodium 136 (*) 137 - 147 mEq/L   Potassium 5.4 (*) 3.7 - 5.3 mEq/L   Comment: HEMOLYSIS AT THIS LEVEL MAY AFFECT RESULT     SLIGHT HEMOLYSIS   Chloride 100  96 - 112 mEq/L   CO2 22  19 - 32 mEq/L   Glucose, Bld 137 (*) 70 - 99 mg/dL   BUN 20  6 - 23 mg/dL   Creatinine, Ser 0.64  0.50 - 1.10 mg/dL   Calcium 9.1  8.4 - 10.5  mg/dL   GFR calc non Af Amer 85 (*) >90 mL/min   GFR calc Af Amer >90  >90 mL/min   Comment: (NOTE)     The eGFR has been calculated using the CKD EPI equation.     This calculation has not been validated in all clinical situations.     eGFR's persistently <90 mL/min signify possible Chronic Kidney     Disease.  CBC     Status: Abnormal   Collection Time    05/09/13  1:30 PM      Result Value Ref Range   WBC 11.2 (*) 4.0 - 10.5 K/uL   Comment: WHITE COUNT CONFIRMED ON SMEAR   RBC 4.87  3.87 - 5.11 MIL/uL   Hemoglobin 13.2  12.0 - 15.0 g/dL   HCT 39.2  36.0 - 46.0 %   MCV 80.5  78.0 - 100.0 fL   MCH 27.1  26.0 - 34.0 pg   MCHC 33.7  30.0 - 36.0 g/dL   RDW 16.6 (*) 11.5 - 15.5 %   Platelets 307  150 - 400 K/uL   Dg Chest 2 View  05/09/2013   CLINICAL DATA:  Preoperative prior to neck surgery ; history of breast malignancy, hypertension, and sleep apnea  EXAM: CHEST  2 VIEW  COMPARISON:  DG ABD ACUTE W/CHEST dated 09/19/2012; DG CHEST 1 VIEW dated 04/27/2012; DG CHEST 1V PORT dated 03/14/2011  FINDINGS: The lungs are well-expanded. There is no focal infiltrate on the frontal film. The middle interstitial markings are minimally prominent on the frontal film, but on the lateral film increased density is demonstrated over the lungs which is likely due to summation effects. He had when compared to the January 2013 film the interstitial markings have in fact increased bilaterally. There is no pleural effusion. The cardiac silhouette is top-normal in size. The pulmonary vascularity is not engorged.  IMPRESSION: There is no evidence of pneumonia nor CHF. Mild prominence of the pulmonary interstitial markings is present and signed report is nonspecific. These have developed since the study of March 14, 2011.   Electronically Signed   By: David  Martinique   On: 05/09/2013 14:55     PHYSICAL EXAM: She is sitting in a wheelchair.  Mental status is basically intact although she has multiple repetitions of  similar questions.  There is a  2 cm rubbery mobile LEFT submandibular node.  LEFT facial nerve is intact including ramus mandibularis.  No other adenopathy.     Lungs: Clear to auscultation Heart: Regular rate and rhythm without murmur Abdomen: Soft, active Extremities:  normal configuration Neurologic: Symmetric, grossly intact.  With flexible laryngoscopy, the nasopharynx is clear with normal eustachian tori.  Oropharynx is clear with normal base of tongue.  Hypopharynx shows mobile vocal cords and no significant erythema or swelling.  No pooling in valleculae or piriforms.  Studies Reviewed:  CT scan of the neck with contrast done earlier today shows an enlarged LEFTsubmandibular node.  The submandibular glands are normal on both sides.  no evidence of stones.  I will call and share this with her.  we will have her come back in for fine needle aspiration for cytology.  If this is lymphoid, she will required node excision for lymphoma evaluation.    Assessment/Plan Lymphadenopathy, cervical (785.6) (R59.0).  We are  on schedule to remove the lump from your LEFT neck on Thursday.  Depending on the decision of the medical oncologist, we may be able to cancel this surgery.  Meanwhile we are making plans to proceed.  Teeth your preoperative appointment tomorrow.  Stop your baby aspirin now and resume after the surgery.  you will stay in the hospital one night after your surgery because of your sleep apnea.  I am leaving you a prescription for hydrocodone for pain relief.  I will plan on seeing you back 10 days after surgery.  Buy a small bottle of Hibiclens (chlorhexidine)  antibacterial liquids soap and wash your LEFT neck with this Wednesday night and Thursday morning.  Hydrocodone-Acetaminophen 5-325 MG Oral Tablet;1-2 po q4h prn pain; [id number]; R0; Rx.  Jodi Marble 4/40/3474, 6:16 PM

## 2013-05-10 NOTE — Anesthesia Preprocedure Evaluation (Signed)
Anesthesia Evaluation  Patient identified by MRN, date of birth, ID band Patient awake    Reviewed: Allergy & Precautions, H&P , NPO status , Patient's Chart, lab work & pertinent test results  Airway       Dental no notable dental hx.    Pulmonary sleep apnea and Oxygen sleep apnea ,    Pulmonary exam normal       Cardiovascular hypertension, Pt. on medications + CAD negative cardio ROS      Neuro/Psych  Headaches, Anxiety Depression negative neurological ROS     GI/Hepatic Neg liver ROS, GERD-  Medicated and Controlled,  Endo/Other  Morbid obesity  Renal/GU negative Renal ROS  negative genitourinary   Musculoskeletal   Abdominal   Peds  Hematology negative hematology ROS (+)   Anesthesia Other Findings   Reproductive/Obstetrics negative OB ROS                           Anesthesia Physical Anesthesia Plan  ASA: III  Anesthesia Plan: General   Post-op Pain Management:    Induction: Intravenous  Airway Management Planned: Oral ETT  Additional Equipment:   Intra-op Plan:   Post-operative Plan: Extubation in OR  Informed Consent: I have reviewed the patients History and Physical, chart, labs and discussed the procedure including the risks, benefits and alternatives for the proposed anesthesia with the patient or authorized representative who has indicated his/her understanding and acceptance.   Dental advisory given  Plan Discussed with: CRNA  Anesthesia Plan Comments:         Anesthesia Quick Evaluation

## 2013-05-10 NOTE — Progress Notes (Signed)
Call to Dr. Ola Spurr, referencing CXR result, no new orders rec'd.

## 2013-05-10 NOTE — Op Note (Signed)
05/10/2013  11:53 AM    [name], [name] Gur[place]  [id number]   Pre-Op Dx:  Left neck level IB lymphoma  Post-op Dx: Same  Proc: Excisional biopsy, left neck mass   Surg:  Jodi Marble T MD  Anes:  GOT  EBL:  Minimal  Comp:  None  Findings:  Soft, pale 2.5 cm left submandibular node.  Procedure: Intravenous access was impossible. A central line was placed preoperatively.  In the operating room, intravenous and general orotracheal anesthesia was induced in the standard fashion. At an appropriate level the head was rotated towards the right for access to the left neck. The patient was placed in reverse Trendelenburg. A shoulder roll was placed to extend the neck. The neck was palpated with the findings as described above. 1% Xylocaine with 1 100,000 epinephrine, 6 cc total was infiltrated along the proposed incision line. Several minutes were allowed for this to take effect. A sterile preparation and draping of the left neck was accomplished in the standard fashion.  A 3 cm transverse incision was sharply executed and carried down through skin, subcutaneous fat, and finally platysma muscle. The mass was identified. Dissection was carried down directly on the mass. Using the ENTceps  thermal forceps, the soft tissue was dissected from around the lymph node.  The facial vein was dissected and ligated with 3-0 silk. The capsule of the node was violated and there was some spillage. The facial artery was identified but was not disturbed. Lymph node was delivered and sent for lymphoma workup.  A small amount of additional cautery was used for hemostasis. The wound was thoroughly irrigated. No bleeding was noted with Valsalva.  The wound was closed with 4-0 chromic suture at the platysma layer, and in the subcutaneous layer. The skin was closed with Dermabond.  At this point the procedure was completed. The patient was returned to anesthesia, awakened, extubated, and transferred to recovery in stable  condition.  Dispo:   PACU to overnight observation for sleep apnea  Plan:  Ice, elevation, analgesia. Routine wound care. Will proceed with medical oncology evaluation and treatment when final tissue diagnosis is available.  Tyson Alias MD

## 2013-05-10 NOTE — Interval H&P Note (Signed)
History and Physical Interval Note:  05/10/2013 9:55 AM  [name]  has presented today for surgery, with the diagnosis of LEFT SUBMANDIBULAR   The various methods of treatment have been discussed with the patient and family. After consideration of risks, benefits and other options for treatment, the patient has consented to  Procedure(s): EXCISION LEFT SUBMANDIBULAR NODE (Left) as a surgical intervention .  The patient's history has been re-reviewed, patient re-examined, no change in status, stable for surgery.  I have re-reviewed the patient's chart and labs.  Questions were answered to the patient's satisfaction.     Jodi Marble

## 2013-05-10 NOTE — Telephone Encounter (Signed)
[id number]. Jaana Brodt, CMA  

## 2013-05-10 NOTE — Preoperative (Signed)
Beta Blockers   Reason not to administer Beta Blockers:Not Applicable 

## 2013-05-10 NOTE — OR Nursing (Signed)
[name] is frustrated for having to wait all day for a bed in stepdown bed.  She is now refusing to allow me to assess her incision site.

## 2013-05-10 NOTE — Anesthesia Postprocedure Evaluation (Signed)
  Anesthesia Post-op Note  Patient: [name]  Procedure(s) Performed: Procedure(s): EXCISION LEFT SUBMANDIBULAR NODE (Left)  Patient Location: PACU  Anesthesia Type:General  Level of Consciousness: awake and alert   Airway and Oxygen Therapy: Patient Spontanous Breathing  Post-op Pain: none  Post-op Assessment: Post-op Vital signs reviewed, Patient's Cardiovascular Status Stable and Respiratory Function Stable  Post-op Vital Signs: Reviewed  Filed Vitals:   05/10/13 1230  BP: 94/66  Pulse: 94  Temp:   Resp: 19    Complications: No apparent anesthesia complications

## 2013-05-10 NOTE — Transfer of Care (Signed)
Immediate Anesthesia Transfer of Care Note  Patient: [name]  Procedure(s) Performed: Procedure(s): EXCISION LEFT SUBMANDIBULAR NODE (Left)  Patient Location: PACU  Anesthesia Type:General  Level of Consciousness: patient cooperative and responds to stimulation  Airway & Oxygen Therapy: Patient Spontanous Breathing and Patient connected to face mask oxygen  Post-op Assessment: Report given to PACU RN, Post -op Vital signs reviewed and stable and Patient moving all extremities X 4  Post vital signs: Reviewed and stable  Complications: No apparent anesthesia complications

## 2013-05-10 NOTE — Discharge Instructions (Signed)
Lymph Node Biopsy Care After Refer to this sheet in the next few weeks. These instructions provide you with information on caring for yourself after your procedure. Your caregiver may also give you more specific instructions. Your treatment has been planned according to current medical practices, but problems sometimes occur. Call your caregiver if you have any problems or questions after your procedure. HOME CARE INSTRUCTIONS  Avoid vigorous exercise. Ask your caregiver when you can return to your normal activities.  You may shower 24 hours after your procedure. It is okay to get your surgical cut (incision) wet. Gently pat the incision dry after you shower.  Keep all follow-up appointments as directed by your caregiver.  Only take over-the-counter or prescription medicines for pain, fever, or discomfort as directed by your caregiver.  You may resume your regular diet. SEEK IMMEDIATE MEDICAL CARE IF:   Your pain is not controlled with medicine.  You notice redness, swelling, or increased fluid draining from the incision.  You feel nauseous or vomit. MAKE SURE YOU:  Understand these instructions.  Will watch your condition.  Will get help right away if you are not doing well or get worse. Document Released: 09/16/2003 Document Revised: 04/26/2011 Document Reviewed: 12/28/2010 [facility] Patient Information 2014 [place], [place].  Recheck my office 10 days.  Call [phone] for an appointment. I will call you with the Pathology results

## 2013-05-11 ENCOUNTER — Encounter (HOSPITAL_COMMUNITY): Payer: Self-pay

## 2013-05-11 MED ORDER — PNEUMOCOCCAL VAC POLYVALENT 25 MCG/0.5ML IJ INJ
0.5000 mL | INJECTION | Freq: Once | INTRAMUSCULAR | Status: DC
  Filled 2013-05-11: qty 0.5

## 2013-05-11 MED ORDER — PNEUMOCOCCAL VAC POLYVALENT 25 MCG/0.5ML IJ INJ
0.5000 mL | INJECTION | INTRAMUSCULAR | Status: DC
  Filled 2013-05-11: qty 0.5

## 2013-05-11 NOTE — Telephone Encounter (Signed)
Called spoke with patient who reported that she had been admitted for surgery to remove a left neck lymphoma.  She was discharged yesterday.  She would like to know if CY thinks this came from her "oxygen machine."  She is okay with call back on Monday.  Dr Annamaria Boots please advise, thank you.

## 2013-05-11 NOTE — Progress Notes (Addendum)
R central line removed per order, no complications. On bedrest until 1230. Will continue to monitor. Pending DC after PNA vaccine.  1235: DC instructions given to patient, questions answered. VSS. No Rx to give. Pt refused PNA vaccine. All belongings sent home with patient. Awaiting family. eICU and CCMT notified of DC.  1300: Pt escorted by NT via wheelchair to private vehicle driven by friend.

## 2013-05-11 NOTE — Discharge Summary (Signed)
  05/11/2013 11:21 AM  Swinger, Imagene Gurney [id number]  Post-Op Day 1    Temp:  [98.1 F (36.7 C)-98.9 F (37.2 C)] 98.5 F (36.9 C) (03/27 0339) Pulse Rate:  [87-102] 87 (03/27 0339) Resp:  [14-28] 19 (03/27 0339) BP: (94-150)/(54-80) 120/62 mmHg (03/27 0339) SpO2:  [93 %-100 %] 93 % (03/27 0339),     Intake/Output Summary (Last 24 hours) at 05/11/13 1121 Last data filed at 05/11/13 0600  Gross per 24 hour  Intake   1600 ml  Output   1300 ml  Net    300 ml    No results found for this or any previous visit (from the past 24 hour(s)).  SUBJECTIVE:  Mild neck pain.  OBJECTIVE:  Wound flat and intact.  IMPRESSION:  Satisfactory check  PLAN:  Discharge home.    Admit: 26 MAR Discharge: 27 MAR Final Diagnosis:  LEFT neck lymphoma.  OSA Proc:  Excisional biopsy, LEFT neck mass, 26 MAR Comp:  None Cond:  Pain controlled.  Breathing well.  Swallowing well.  Activity at baseline Recheck:  10 days Rx:  Hydrocodone Instructions written and given  Hosp Course:  Admitted for surgery.  Req'd central line for IV access.   Removed LEFT neck mass under general anesthesia.  Observed overnight on account of OSA.  No significant O2 issues.  Satisfactory eval on POD 1.  Central line removed and discharged to home and care of family.  Jodi Marble

## 2013-05-11 NOTE — Progress Notes (Signed)
Utilization review completed.  

## 2013-05-13 NOTE — Telephone Encounter (Signed)
See my response of 3/24

## 2013-05-14 NOTE — Telephone Encounter (Signed)
Pt advised of CY response on 05/08/13.Imbler Bing, CMA

## 2013-05-16 ENCOUNTER — Telehealth: Payer: Self-pay | Admitting: Internal Medicine

## 2013-05-16 MED ORDER — DOXYCYCLINE HYCLATE 100 MG PO TABS: ORAL_TABLET | ORAL | Status: DC

## 2013-05-16 NOTE — Telephone Encounter (Signed)
Ok doxycycline 100 mg, # 8, 2 today then one daily 

## 2013-05-16 NOTE — Telephone Encounter (Signed)
Called spoke with pt. Requesting ABX doxy. C/o neck pain, off and on dry cough x couple days. No wheezing, no chest tx, no f/s/c/n/v. Please advise Dr. Annamaria Boots thanks  No Known Allergies   Current Outpatient Prescriptions on File Prior to Visit  Medication Sig Dispense Refill  . amitriptyline (ELAVIL) 100 MG tablet Take 100 mg by mouth at bedtime.      Marland Kitchen anastrozole (ARIMIDEX) 1 MG tablet Take 1 tablet (1 mg total) by mouth daily.  90 tablet  40  . aspirin 81 MG EC tablet Take 81 mg by mouth daily.        Marland Kitchen atorvastatin (LIPITOR) 40 MG tablet Take 1 tablet (40 mg total) by mouth daily at 6 PM.  30 tablet  12  . Calcium Carbonate-Vit D-Min (CALTRATE 600+D PLUS) 600-400 MG-UNIT per tablet Take 1 tablet by mouth daily.        . cholecalciferol (VITAMIN D) 1000 UNITS tablet Take 1,000 Units by mouth daily.      . Cinnamon 500 MG TABS Take 1,000 mg by mouth daily.      . Coenzyme Q10 (CO Q 10 PO) Take 1 capsule by mouth daily.      Marland Kitchen esomeprazole (NEXIUM) 40 MG capsule Take 40 mg by mouth daily at 12 noon.      Marland Kitchen LORazepam (ATIVAN) 2 MG tablet Take 2 mg by mouth every 6 (six) hours as needed for anxiety. PRN............Marland KitchenMarland KitchenAnxiety and panic attacks      . MAGNESIUM OXIDE PO Take 1 tablet by mouth daily. Pt doesn't know the dose of the medication.      . nitroGLYCERIN (NITROSTAT) 0.4 MG SL tablet Place 0.4 mg under the tongue every 5 (five) minutes as needed for chest pain.      . Omega-3 Fatty Acids (FISH OIL) 1000 MG CAPS Take 1,000 mg by mouth daily.      . polyethylene glycol (MIRALAX / GLYCOLAX) packet Take 17 g by mouth 2 (two) times daily.      . quinapril (ACCUPRIL) 20 MG tablet Take 20 mg by mouth daily.       No current facility-administered medications on file prior to visit.

## 2013-05-16 NOTE — Telephone Encounter (Signed)
lmomtcb x1 rx sent 

## 2013-05-17 ENCOUNTER — Other Ambulatory Visit: Payer: Self-pay | Admitting: Oncology

## 2013-05-17 NOTE — Telephone Encounter (Signed)
LMTCB

## 2013-05-18 ENCOUNTER — Other Ambulatory Visit: Payer: Self-pay | Admitting: Oncology

## 2013-05-18 NOTE — Progress Notes (Unsigned)
Ms. [name] has been found to have a large cell non-Hodgkin's lymphoma. I believe she would fit the criteria for our current study. I called her at her home twice and left messages, but she called back this morning to say that she is seeking treatment elsewhere and " we do not need to call her anymore".

## 2013-05-21 NOTE — Telephone Encounter (Signed)
Called the pt x 3 - line was busy  Called [facility] and spoke with the pharmacist Thayer Headings, who advised that the pt picked up doxy rx on 05/17/13 Will go ahead and close encounter

## 2013-05-22 ENCOUNTER — Telehealth: Payer: Self-pay | Admitting: Hematology and Oncology

## 2013-05-22 NOTE — Telephone Encounter (Signed)
PATIENT CALLED TO SCHEDULE NEW PATIENT APPT FOR 04/15 @ 11:15 W/DR. Breathitt.  DX- LARGE CELL NHL

## 2013-05-23 ENCOUNTER — Telehealth: Payer: Self-pay | Admitting: Internal Medicine

## 2013-05-23 NOTE — Telephone Encounter (Signed)
Spoke with the pt  She is c/o neck pain around the site of her incision where she had recent surgery  She was seen by her surgeon on Monday 05/21/13 and he advised everything was healing fine, but since then her pain is worse  I advised she should call her surgeon to let them know and maybe have another evaluation there  She verbalized understanding and nothing further needed

## 2013-05-30 ENCOUNTER — Telehealth: Payer: Self-pay | Admitting: *Deleted

## 2013-05-30 ENCOUNTER — Telehealth: Payer: Self-pay | Admitting: Hematology and Oncology

## 2013-05-30 ENCOUNTER — Encounter: Payer: Self-pay | Admitting: Hematology and Oncology

## 2013-05-30 ENCOUNTER — Ambulatory Visit (HOSPITAL_BASED_OUTPATIENT_CLINIC_OR_DEPARTMENT_OTHER): Payer: Medicare HMO | Admitting: Hematology and Oncology

## 2013-05-30 VITALS — BP 116/84 | HR 98 | Temp 98.0°F | Resp 18

## 2013-05-30 DIAGNOSIS — Z853 Personal history of malignant neoplasm of breast: Secondary | ICD-10-CM

## 2013-05-30 DIAGNOSIS — C8330 Diffuse large B-cell lymphoma, unspecified site: Secondary | ICD-10-CM

## 2013-05-30 DIAGNOSIS — C8581 Other specified types of non-Hodgkin lymphoma, lymph nodes of head, face, and neck: Secondary | ICD-10-CM

## 2013-05-30 NOTE — Telephone Encounter (Signed)
Pt called stating that she came in today and received some appts and would be getting a call from a scheduler for the PET scan.  She indicates that she has left several messages for scheduling and for the nurse and cannot get a return call.  I informed her that we are very shorts staffed and that someone would get back in touch with her soon.  I took the information to [facility] in scheduling.

## 2013-05-30 NOTE — Progress Notes (Signed)
[facility] progress notes  Patient Care Team: Tivis Ringer, MD as PCP - General (Internal Medicine)  CHIEF COMPLAINTS/PURPOSE OF VISIT:  Newly diagnosed diffuse large B-cell lymphoma  HISTORY OF PRESENTING ILLNESS:  [name] 78 y.o. female was transferred to my care per patient request. She was diagnosed with diffuse large B-cell lymphoma. She was seen at ENT recently to clean her ears. The surgeon noticed enlarged lymphadenopathy on the left cervical region and did a fine-needle aspirate and biopsy on 05/02/2013 consistent with high-grade lymphoma. On 05/10/2013, she underwent excisional lymph node biopsy consistent with diffuse large B-cell lymphoma. She is doing well otherwise. Denies any new lymphadenopathy. She denies any B. symptoms such as unexplained weight loss, skin itching, diffuse night sweats or anorexia.  She also had background history of right breast cancer status post mastectomy.I reviewed the patient's records extensive and collaborated the history with the patient.   Summary of her history is as follows: 1. Invasive lobular carcinoma of the right breast dating back to November 2002. pT3 pN1a M0, Stage IIIA.  A. Estrogen receptor and progesterone receptor were both 94%. HER-2/neu was negative. Ki67 was 6%,DNA index was diploid.  B. The patient underwent a modified radical mastectomy on 01/03/2001.  C. She was treated adjuvantly with 6 cycles of 5-fluorouracil, epirubicin, and Cyclophosphamide. Chemotherapy treatments were concluded in May 2003  D.on Arimidex since May 2003, continued at the patient's request.  E. normal DEXA scan 12/29/2009   MEDICAL HISTORY:  Past Medical History  Diagnosis Date  . Sleep apnea     insomnia  . Hyperlipidemia     takes Atorvastatin daily  . Chronic headache   . Osteoarthritis   . CAD (coronary artery disease)   . Chronic chest pain   . Constipation   . Malignant neoplasm of breast     right  breast  . Hypertension     takes Quinapril daily  . Joint pain   . Low back pain   . Constipation     takes Miralax daily  . GERD (gastroesophageal reflux disease)     takes Nexium daily  . History of colon polyps   . Anxiety     takes Ativan daily  . Panic attacks   . Depression     takes Elavil nightly  . Shortness of breath     dyspnea, uses O2 nasal  3 liters/min. at night only  . Cancer     lymphoma    SURGICAL HISTORY: Past Surgical History  Procedure Laterality Date  . Cholecystectomy    . Right mastectomy    . Tonsillectomy    . Knee surgery Right   . US echocardiography  10/04/2006    EF 55-60%  . Heart stent  May 2011 per pt  . Portacath placement      x 2  . Inner ear surgery    . Cardiac catheterization  2012  . Port removed    . Esophagogastroduodenoscopy    . Colonoscopy    . Cataract surgery Bilateral   . Submandibular gland excision Left 05/10/2013    Procedure: EXCISION LEFT SUBMANDIBULAR NODE;  Surgeon: Jodi Marble, MD;  Location: [facility];  Service: ENT;  Laterality: Left;    SOCIAL HISTORY: History   Social History  . Marital Status: Divorced    Spouse Name: N/A    Number of Children: 2  . Years of Education: N/A   Occupational History  . Unemployed  Social History Main Topics  . Smoking status: Never Smoker   . Smokeless tobacco: Never Used  . Alcohol Use: No  . Drug Use: No  . Sexual Activity: Yes    Birth Control/ Protection: Post-menopausal   Other Topics Concern  . Not on file   Social History Narrative  . No narrative on file    FAMILY HISTORY: History reviewed. No pertinent family history.  ALLERGIES:  has No Known Allergies.  MEDICATIONS:  Current Outpatient Prescriptions  Medication Sig Dispense Refill  . amitriptyline (ELAVIL) 100 MG tablet Take 100 mg by mouth at bedtime.      Marland Kitchen anastrozole (ARIMIDEX) 1 MG tablet Take 1 tablet (1 mg total) by mouth daily.  90 tablet  40  . aspirin 81 MG EC tablet Take 81 mg  by mouth daily.        Marland Kitchen atorvastatin (LIPITOR) 40 MG tablet Take 1 tablet (40 mg total) by mouth daily at 6 PM.  30 tablet  12  . Calcium Carbonate-Vit D-Min (CALTRATE 600+D PLUS) 600-400 MG-UNIT per tablet Take 1 tablet by mouth daily.        . cholecalciferol (VITAMIN D) 1000 UNITS tablet Take 1,000 Units by mouth daily.      . Cinnamon 500 MG TABS Take 1,000 mg by mouth daily.      . Coenzyme Q10 (CO Q 10 PO) Take 1 capsule by mouth daily.      Marland Kitchen LORazepam (ATIVAN) 2 MG tablet Take 2 mg by mouth every 6 (six) hours as needed for anxiety. PRN............Marland KitchenMarland KitchenAnxiety and panic attacks      . MAGNESIUM OXIDE PO Take 1 tablet by mouth daily. Pt doesn't know the dose of the medication.      . nitroGLYCERIN (NITROSTAT) 0.4 MG SL tablet Place 0.4 mg under the tongue every 5 (five) minutes as needed for chest pain.      . Omega-3 Fatty Acids (FISH OIL) 1000 MG CAPS Take 1,000 mg by mouth daily.      . polyethylene glycol (MIRALAX / GLYCOLAX) packet Take 17 g by mouth 2 (two) times daily.      . quinapril (ACCUPRIL) 20 MG tablet Take 20 mg by mouth daily.       No current facility-administered medications for this visit.    REVIEW OF SYSTEMS:   Constitutional: Denies fevers, chills or abnormal night sweats Eyes: Denies blurriness of vision, double vision or watery eyes Ears, nose, mouth, throat, and face: Denies mucositis or sore throat Respiratory: Denies cough, dyspnea or wheezes. She has very limited mobility and sits on a wheelchair. Cardiovascular: Denies palpitation, chest discomfort or lower extremity swelling Gastrointestinal:  Denies nausea, heartburn or change in bowel habits Skin: Denies abnormal skin rashes Lymphatics: Denies new lymphadenopathy or easy bruising Neurological:Denies numbness, tingling or new weaknesses Behavioral/Psych: Mood is stable, no new changes  All other systems were reviewed with the patient and are negative.  PHYSICAL EXAMINATION: ECOG PERFORMANCE STATUS: 2  - Symptomatic, <50% confined to bed  Filed Vitals:   05/30/13 1110  BP: 116/84  Pulse: 98  Temp: 98 F (36.7 C)  Resp: 18   Filed Weights    GENERAL:alert, no distress and comfortable. She is morbidly obese and unkempt SKIN: skin color, texture, turgor are normal, no rashes or significant lesions EYES: normal, conjunctiva are pink and non-injected, sclera clear OROPHARYNX:no exudate, normal lips, buccal mucosa, and tongue  NECK: supple, thyroid normal size, non-tender, without nodularity LYMPH:  no palpable lymphadenopathy in  the cervical, axillary or inguinal. Well-healed surgical scar in the left neck LUNGS: clear to auscultation and percussion with normal breathing effort HEART: regular rate & rhythm and no murmurs without lower extremity edema ABDOMEN:abdomen soft, non-tender and normal bowel sounds Musculoskeletal:no cyanosis of digits and no clubbing  PSYCH: alert & oriented x 3 with fluent speech NEURO: no focal motor/sensory deficits Breasts examination of the left was normal. On the right, well-healed mastectomy scar. LABORATORY DATA:  I have reviewed the data as listed Lab Results  Component Value Date   WBC 11.2* 05/09/2013   HGB 13.2 05/09/2013   HCT 39.2 05/09/2013   MCV 80.5 05/09/2013   PLT 307 05/09/2013    Recent Labs  09/20/12 0451 09/21/12 0455 12/06/12 1313 12/06/12 1417 03/21/13 1353 05/09/13 1330  NA 139 138 138 137 140 136*  K 4.2 3.9 5.1 4.0 3.9 5.4*  CL 109 107 106  --   --  100  CO2 22 23  --  23 21* 22  GLUCOSE 114* 132* 101* 91 125 137*  BUN $Re'13 9 16 'eeE$ 11.8 12.8 20  CREATININE 0.68 0.70 0.80 0.8 0.8 0.64  CALCIUM 8.5 9.0  --  9.9 9.7 9.1  GFRNONAA 83* 83*  --   --   --  85*  GFRAA >90 >90  --   --   --  >90  PROT 6.0  --   --  7.6 7.5  --   ALBUMIN 2.8*  --   --  3.6 3.7  --   AST 14  --   --  21 19  --   ALT 19  --   --  29 31  --   ALKPHOS 221*  --   --  292* 288*  --   BILITOT 0.4  --   --  0.59 0.54  --     RADIOGRAPHIC STUDIES:  CT scan dated 04/26/2013 show bilateral neck lymphadenopathy. A prior CT scan on 09/19/2012 showed diffuse abdominal lymphadenopathy. ASSESSMENT & PLAN:  #1 diffuse large B-cell lymphoma I suspect she has a transformed low-grade lymphoma now to high-grade diffuse large B cell lymphoma. Prior CT scan showed clearly shows she had abdominal lymphadenopathy. I recommend PET scan as well as bone marrow biopsy to stage her disease. I will present her case at the next hematology tumor board. She had poor performance status and for this case I would recommend giving Bendamustine with rituximab as I do not think she can tolerate anthracyclines. She also had prior exposure to epirubicin for treatment for breast cancer 12 years ago. #2 remote history of breast cancer I would discontinue Arimidex in preparation for treatments for her lymphoma Clinically, she does not have evidence of recurrence of disease.  Orders Placed This Encounter  Procedures  . NM PET Image Initial (PI) Skull Base To Thigh    Standing Status: Future     Number of Occurrences:      Standing Expiration Date: 07/30/2014    Order Specific Question:  Reason for Exam (SYMPTOM  OR DIAGNOSIS REQUIRED)    Answer:  lymphoma staging    Order Specific Question:  Preferred imaging location?    Answer:  [facility] Left    Port for chemo    Standing Status: Future     Number of Occurrences:      Standing Expiration Date: 07/31/2014    Order Specific Question:  Reason for exam:    Answer:  need port for chemo, hx right mastectomy    Order Specific Question:  Preferred Imaging Location?    Answer:  [facility]    All questions were answered. The patient knows to call the clinic with any problems, questions or concerns. I spent 55 minutes counseling the patient face to face. The total time spent in the appointment was 60 minutes and more than 50% was on counseling.     Heath Lark, MD 05/30/2013  8:37 PM

## 2013-05-30 NOTE — Telephone Encounter (Signed)
gv and printed appt sched and avs forpt for April....sed added tx. °

## 2013-05-30 NOTE — Telephone Encounter (Signed)
returned pt call line kept being busy

## 2013-05-30 NOTE — Telephone Encounter (Signed)
Instructed pt on BMBx on 4/22, arrive at [facility] at 7 am,  NPO after midnight and need a driver. [facility] in [place] has been notified.  Also instructed on PET scan 4/21,  Arrive at [facility] at 6;45 am,  NPO after midnight.   Reminded pt of Chemo Education class this Friday 4/17 at 9:30 am.   Also reminded of [facility] placement scheduled for 4/20 at 11:30 am and to expect call from Interventional Radiology w/ instructions.  Pt states wrote down instructions and read back to this nurse.

## 2013-05-31 ENCOUNTER — Telehealth: Payer: Self-pay | Admitting: *Deleted

## 2013-05-31 ENCOUNTER — Other Ambulatory Visit: Payer: Self-pay | Admitting: Radiology

## 2013-05-31 NOTE — Telephone Encounter (Signed)
Pt called about her schedule on 4/21,  PET at 7 am and Dr. Alvy Bimler at 3:30 pm.  States difficult for her to go home and come back again, also difficult for her to wait that long.   Arranged for another pt to move his appt to afternoon and [name] can now see Dr. Alvy Bimler at 9:30 am after her PET.  Instructed pt on new time and to come to [facility] after her PET on tues 4/21.  She verbalized understanding.

## 2013-06-01 ENCOUNTER — Encounter (HOSPITAL_COMMUNITY): Payer: Self-pay | Admitting: Pharmacy Technician

## 2013-06-01 ENCOUNTER — Telehealth: Payer: Self-pay | Admitting: *Deleted

## 2013-06-01 ENCOUNTER — Telehealth: Payer: Self-pay | Admitting: Hematology and Oncology

## 2013-06-01 ENCOUNTER — Other Ambulatory Visit: Payer: Medicare HMO

## 2013-06-01 NOTE — Telephone Encounter (Signed)
called pt back and machine said pt unavailable.

## 2013-06-01 NOTE — Telephone Encounter (Signed)
Pt left a VM asking the nurse to call her back "about her health."  Attempted to call pt back x 2 and received a busy signal each time.

## 2013-06-01 NOTE — Telephone Encounter (Signed)
Pt called to report she was unable to attend the chemo education class this morning but she has got it r/s to next week.   Pt asked a lot of random questions, went into some detail about her IBS several years ago and asked if it could cause Lymphoma? Informed pt this RN unaware of any link but to discuss w/ Dr. Alvy Bimler next week. She talked about her brother for some time and about [place]. Attempted to re direct pt to any medical issues she is having and finally had to tell pt that I was unable to talk any further but to please call back if she has any problems before her next appt.Marland Kitchen

## 2013-06-04 ENCOUNTER — Telehealth: Payer: Self-pay | Admitting: *Deleted

## 2013-06-04 ENCOUNTER — Encounter (HOSPITAL_COMMUNITY): Payer: Self-pay

## 2013-06-04 ENCOUNTER — Other Ambulatory Visit: Payer: Self-pay | Admitting: Hematology and Oncology

## 2013-06-04 ENCOUNTER — Ambulatory Visit (HOSPITAL_COMMUNITY)
Admission: RE | Admit: 2013-06-04 | Discharge: 2013-06-04 | Disposition: A | Discharge status: Home / Self Care | Payer: Medicare HMO | Source: Ambulatory Visit | Attending: Hematology and Oncology | Admitting: Hematology and Oncology

## 2013-06-04 DIAGNOSIS — C50919 Malignant neoplasm of unspecified site of unspecified female breast: Secondary | ICD-10-CM | POA: Insufficient documentation

## 2013-06-04 DIAGNOSIS — G4730 Sleep apnea, unspecified: Secondary | ICD-10-CM | POA: Insufficient documentation

## 2013-06-04 DIAGNOSIS — K219 Gastro-esophageal reflux disease without esophagitis: Secondary | ICD-10-CM | POA: Insufficient documentation

## 2013-06-04 DIAGNOSIS — F3289 Other specified depressive episodes: Secondary | ICD-10-CM | POA: Insufficient documentation

## 2013-06-04 DIAGNOSIS — C8330 Diffuse large B-cell lymphoma, unspecified site: Secondary | ICD-10-CM

## 2013-06-04 DIAGNOSIS — I2510 Atherosclerotic heart disease of native coronary artery without angina pectoris: Secondary | ICD-10-CM | POA: Insufficient documentation

## 2013-06-04 DIAGNOSIS — Z860100 Personal history of colon polyps, unspecified: Secondary | ICD-10-CM | POA: Insufficient documentation

## 2013-06-04 DIAGNOSIS — C8589 Other specified types of non-Hodgkin lymphoma, extranodal and solid organ sites: Secondary | ICD-10-CM | POA: Insufficient documentation

## 2013-06-04 DIAGNOSIS — E785 Hyperlipidemia, unspecified: Secondary | ICD-10-CM | POA: Insufficient documentation

## 2013-06-04 DIAGNOSIS — F329 Major depressive disorder, single episode, unspecified: Secondary | ICD-10-CM | POA: Insufficient documentation

## 2013-06-04 DIAGNOSIS — Z9981 Dependence on supplemental oxygen: Secondary | ICD-10-CM | POA: Insufficient documentation

## 2013-06-04 DIAGNOSIS — I10 Essential (primary) hypertension: Secondary | ICD-10-CM | POA: Insufficient documentation

## 2013-06-04 HISTORY — PX: PORTACATH PLACEMENT: SHX2246

## 2013-06-04 LAB — CBC WITH DIFFERENTIAL/PLATELET
Basophils Absolute: 0 10*3/uL (ref 0.0–0.1)
Basophils Relative: 0 % (ref 0–1)
Eosinophils Absolute: 0.5 10*3/uL (ref 0.0–0.7)
Eosinophils Relative: 7 % — ABNORMAL HIGH (ref 0–5)
HCT: 38.8 % (ref 36.0–46.0)
Hemoglobin: 12.8 g/dL (ref 12.0–15.0)
LYMPHS PCT: 17 % (ref 12–46)
Lymphs Abs: 1.4 10*3/uL (ref 0.7–4.0)
MCH: 26.6 pg (ref 26.0–34.0)
MCHC: 33 g/dL (ref 30.0–36.0)
MCV: 80.5 fL (ref 78.0–100.0)
Monocytes Absolute: 0.7 10*3/uL (ref 0.1–1.0)
Monocytes Relative: 8 % (ref 3–12)
NEUTROS ABS: 5.6 10*3/uL (ref 1.7–7.7)
Neutrophils Relative %: 68 % (ref 43–77)
Platelets: 302 10*3/uL (ref 150–400)
RBC: 4.82 MIL/uL (ref 3.87–5.11)
RDW: 16.9 % — AB (ref 11.5–15.5)
WBC: 8.2 10*3/uL (ref 4.0–10.5)

## 2013-06-04 LAB — PROTIME-INR
INR: 1.01 (ref 0.00–1.49)
PROTHROMBIN TIME: 13.1 s (ref 11.6–15.2)

## 2013-06-04 LAB — APTT: aPTT: 25 seconds (ref 24–37)

## 2013-06-04 MED ORDER — LIDOCAINE HCL 1 % IJ SOLN
INTRAMUSCULAR | Status: AC
  Filled 2013-06-04: qty 20

## 2013-06-04 MED ORDER — HEPARIN SOD (PORK) LOCK FLUSH 100 UNIT/ML IV SOLN
INTRAVENOUS | Status: AC
  Filled 2013-06-04: qty 5

## 2013-06-04 MED ORDER — LIDOCAINE-EPINEPHRINE (PF) 2 %-1:200000 IJ SOLN
INTRAMUSCULAR | Status: AC
  Filled 2013-06-04: qty 20

## 2013-06-04 MED ORDER — MIDAZOLAM HCL 2 MG/2ML IJ SOLN
INTRAMUSCULAR | Status: AC | PRN
  Administered 2013-06-04 (×4): 0.5 mg via INTRAVENOUS
  Administered 2013-06-04: 1 mg via INTRAVENOUS
  Administered 2013-06-04 (×2): 0.5 mg via INTRAVENOUS

## 2013-06-04 MED ORDER — FENTANYL CITRATE 0.05 MG/ML IJ SOLN
INTRAMUSCULAR | Status: AC
  Filled 2013-06-04: qty 6

## 2013-06-04 MED ORDER — HEPARIN SOD (PORK) LOCK FLUSH 100 UNIT/ML IV SOLN
INTRAVENOUS | Status: AC | PRN
  Administered 2013-06-04: 500 [IU]

## 2013-06-04 MED ORDER — MIDAZOLAM HCL 2 MG/2ML IJ SOLN
INTRAMUSCULAR | Status: AC
  Filled 2013-06-04: qty 6

## 2013-06-04 MED ORDER — SODIUM CHLORIDE 0.9 % IV SOLN
INTRAVENOUS | Status: DC
  Administered 2013-06-04: 13:00:00 via INTRAVENOUS

## 2013-06-04 MED ORDER — FENTANYL CITRATE 0.05 MG/ML IJ SOLN
INTRAMUSCULAR | Status: AC | PRN
  Administered 2013-06-04 (×2): 25 ug via INTRAVENOUS

## 2013-06-04 MED ORDER — CEFAZOLIN SODIUM-DEXTROSE 2-3 GM-% IV SOLR
2.0000 g | Freq: Once | INTRAVENOUS | Status: AC
  Administered 2013-06-04: 2 g via INTRAVENOUS
  Filled 2013-06-04: qty 50

## 2013-06-04 NOTE — Procedures (Signed)
Successful LT EJ POWER PORT TIP SVC/RA NO COMP READY TO USE FULL REPORT IN PACS

## 2013-06-04 NOTE — Telephone Encounter (Signed)
Pt has PAC placement scheduled today.  She is to arrive at 11:30 to hospital.  Pt asks if she should take her blood pressure medication this morning.  Instructed to hold her meds this morning and nothing to eat or drink until after her procedure.  Pt says she took her ativan earlier this morning because there "is no way" she can make to the procedure w/o it.  Instructed her to just not take any more meds this morning and let the pre op nurse know that she took it.  She verbalized understanding.

## 2013-06-04 NOTE — Telephone Encounter (Signed)
Pt called to ask what time she needs to come for PET scan tomorrow? She also asks if she is allowed to eat or drink anything before?  She complained that "no one told me anything and I don't know why they didn't write this down."   Reminded pt that this RN has gone over pt's schedule and instructions more than a few times.  Informed pt that I specifically asked pt to write down these instructions last week and that she said she was writing it down at that time.  Pt says she doesn't remember and complains that some one needs to write it down for her.  Explained that PET appt was made after she left our clinic so no one could have written it for her.  Asked her to write instructions down now and she said she is. Instructed to arrive at Radiology tomorrow am at 6:45 am and Nothing to eat/drink after midnight. She verbalized understanding and says she wrote it down.

## 2013-06-04 NOTE — Discharge Instructions (Signed)
Implanted Port Home Guide °An implanted port is a type of central line that is placed under the skin. Central lines are used to provide IV access when treatment or nutrition needs to be given through a person's veins. Implanted ports are used for long-term IV access. An implanted port may be placed because:  °· You need IV medicine that would be irritating to the small veins in your hands or arms.   °· You need long-term IV medicines, such as antibiotics.   °· You need IV nutrition for a long period.   °· You need frequent blood draws for lab tests.   °· You need dialysis.   °Implanted ports are usually placed in the chest area, but they can also be placed in the upper arm, the abdomen, or the leg. An implanted port has two main parts:  °· Reservoir. The reservoir is round and will appear as a small, raised area under your skin. The reservoir is the part where a needle is inserted to give medicines or draw blood.   °· Catheter. The catheter is a thin, flexible tube that extends from the reservoir. The catheter is placed into a large vein. Medicine that is inserted into the reservoir goes into the catheter and then into the vein.   °HOW WILL I CARE FOR MY INCISION SITE? °Do not get the incision site wet. Bathe or shower as directed by your health care provider.  °HOW IS MY PORT ACCESSED? °Special steps must be taken to access the port:  °· Before the port is accessed, a numbing cream can be placed on the skin. This helps numb the skin over the port site.   °· Your health care provider uses a sterile technique to access the port. °· Your health care provider must put on a mask and sterile gloves. °· The skin over your port is cleaned carefully with an antiseptic and allowed to dry. °· The port is gently pinched between sterile gloves, and a needle is inserted into the port. °· Only "non-coring" port needles should be used to access the port. Once the port is accessed, a blood return should be checked. This helps  ensure that the port is in the vein and is not clogged.   °· If your port needs to remain accessed for a constant infusion, a clear (transparent) bandage will be placed over the needle site. The bandage and needle will need to be changed every week, or as directed by your health care provider.   °· Keep the bandage covering the needle clean and dry. Do not get it wet. Follow your health care provider's instructions on how to take a shower or bath while the port is accessed.   °· If your port does not need to stay accessed, no bandage is needed over the port.   °WHAT IS FLUSHING? °Flushing helps keep the port from getting clogged. Follow your health care provider's instructions on how and when to flush the port. Ports are usually flushed with saline solution or a medicine called heparin. The need for flushing will depend on how the port is used.  °· If the port is used for intermittent medicines or blood draws, the port will need to be flushed:   °· After medicines have been given.   °· After blood has been drawn.   °· As part of routine maintenance.   °· If a constant infusion is running, the port may not need to be flushed.   °HOW LONG WILL MY PORT STAY IMPLANTED? °The port can stay in for as long as your health care   provider thinks it is needed. When it is time for the port to come out, surgery will be done to remove it. The procedure is similar to the one performed when the port was put in.  °WHEN SHOULD I SEEK IMMEDIATE MEDICAL CARE? °When you have an implanted port, you should seek immediate medical care if:  °· You notice a bad smell coming from the incision site.   °· You have swelling, redness, or drainage at the incision site.   °· You have more swelling or pain at the port site or the surrounding area.   °· You have a fever that is not controlled with medicine. °Document Released: 02/01/2005 Document Revised: 11/22/2012 Document Reviewed: 10/09/2012 °[employer]® Patient Information ©2014 [employer]. °Moderate Sedation, Adult °Moderate sedation is given to help you relax or even sleep through a procedure. You may remain sleepy, be clumsy, or have poor balance for several hours following this procedure. Arrange for a responsible adult, family member, or friend to take you home. A responsible adult should stay with you for at least 24 hours or until the medicines have worn off. °· Do not participate in any activities where you could become injured for the next 24 hours, or until you feel normal again. Do not: °· Drive. °· Swim. °· Ride a bicycle. °· Operate heavy machinery. °· Cook. °· Use power tools. °· Climb ladders. °· Work at heights. °· Do not make important decisions or sign legal documents until you are improved. °· Vomiting may occur if you eat too soon. When you can drink without vomiting, try water, juice, or soup. Try solid foods if you feel little or no nausea. °· Only take over-the-counter or prescription medications for pain, discomfort, or fever as directed by your caregiver.If pain medications have been prescribed for you, ask your caregiver how soon it is safe to take them. °· Make sure you and your family fully understands everything about the medication given to you. Make sure you understand what side effects may occur. °· You should not drink alcohol, take sleeping pills, or medications that cause drowsiness for at least 24 hours. °· If you smoke, do not smoke alone. °· If you are feeling better, you may resume normal activities 24 hours after receiving sedation. °· Keep all appointments as scheduled. Follow all instructions. °· Ask questions if you do not understand. °SEEK MEDICAL CARE IF:  °· Your skin is pale or bluish in color. °· You continue to feel sick to your stomach (nauseous) or throw up (vomit). °· Your pain is getting worse and not helped by medication. °· You have bleeding or swelling. °· You are still sleepy or feeling clumsy after 24 hours. °SEEK IMMEDIATE MEDICAL CARE IF:   °· You develop a rash. °· You have difficulty breathing. °· You develop any type of allergic problem. °· You have a fever. °Document Released: 10/27/2000 Document Revised: 04/26/2011 Document Reviewed: 10/09/2012 °[employer]® Patient Information ©2014 [employer]. °Implanted Port Insertion, Care After °Refer to this sheet in the next few weeks. These instructions provide you with information on caring for yourself after your procedure. Your health care provider may also give you more specific instructions. Your treatment has been planned according to current medical practices, but problems sometimes occur. Call your health care provider if you have any problems or questions after your procedure. °WHAT TO EXPECT AFTER THE PROCEDURE °After your procedure, it is typical to have the following:  °· Discomfort at the port insertion site. Ice packs to the   area will help. °· Bruising on the skin over the port. This will subside in 3 4 days. °HOME CARE INSTRUCTIONS °· After your port is placed, you will get a manufacturer's information card. The card has information about your port. Keep this card with you at all times.   °· Know what kind of port you have. There are many types of ports available.   °· Wear a medical alert bracelet in case of an emergency. This can help alert health care workers that you have a port.   °· The port can stay in for as long as your health care provider believes it is necessary.   °· A home health care nurse may give medicines and take care of the port.   °· You or a family member can get special training and directions for giving medicine and taking care of the port at home.   °SEEK MEDICAL CARE IF:  °Your port does not flush or you are unable to get a blood return.    °SEEK IMMEDIATE MEDICAL CARE IF: °· You have new fluid or pus coming from your incision.   °· You notice a bad smell coming from your incision site.   °· You have swelling, pain, or more redness at the incision or port site.    °· You have a fever or chills.   °· You have chest pain or shortness of breath. °Document Released: 11/22/2012 Document Reviewed: 10/09/2012 °[employer]® Patient Information ©2014 [employer]. ° °

## 2013-06-04 NOTE — H&P (Signed)
Chief Complaint: "I'm here for a port" Referring Physician:Gorsuch HPI: [name] is an 78 y.o. female with lymphoma. She is to begin chemotherapy soon and is scheduled for a portacath today. PMHx and meds reviewed. Pt feels ok. Has a chronic dry cough, but denies fevers, chills, SOB.  Past Medical History:  Past Medical History  Diagnosis Date  . Sleep apnea     insomnia  . Hyperlipidemia     takes Atorvastatin daily  . Chronic headache   . Osteoarthritis   . CAD (coronary artery disease)   . Chronic chest pain   . Constipation   . Malignant neoplasm of breast     right breast  . Hypertension     takes Quinapril daily  . Joint pain   . Low back pain   . Constipation     takes Miralax daily  . GERD (gastroesophageal reflux disease)     takes Nexium daily  . History of colon polyps   . Anxiety     takes Ativan daily  . Panic attacks   . Depression     takes Elavil nightly  . Shortness of breath     dyspnea, uses O2 nasal  3 liters/min. at night only  . Cancer     lymphoma    Past Surgical History:  Past Surgical History  Procedure Laterality Date  . Cholecystectomy    . Right mastectomy    . Tonsillectomy    . Knee surgery Right   . US echocardiography  10/04/2006    EF 55-60%  . Heart stent  May 2011 per pt  . Portacath placement      x 2  . Inner ear surgery    . Cardiac catheterization  2012  . Port removed    . Esophagogastroduodenoscopy    . Colonoscopy    . Cataract surgery Bilateral   . Submandibular gland excision Left 05/10/2013    Procedure: EXCISION LEFT SUBMANDIBULAR NODE;  Surgeon: Jodi Marble, MD;  Location: [facility];  Service: ENT;  Laterality: Left;    Family History: History reviewed. No pertinent family history.  Social History:  reports that she has never smoked. She has never used smokeless tobacco. She reports that she does not drink alcohol or use illicit drugs.  Allergies: No Known Allergies  Medications:   Medication List     ASK your doctor about these medications       amitriptyline 100 MG tablet  Commonly known as:  ELAVIL  Take 100 mg by mouth at bedtime.     anastrozole 1 MG tablet  Commonly known as:  ARIMIDEX  Take 1 mg by mouth at bedtime.     aspirin 81 MG EC tablet  Take 81 mg by mouth every morning.     atorvastatin 40 MG tablet  Commonly known as:  LIPITOR  Take 40 mg by mouth every morning.     CALTRATE 600+D PLUS 600-400 MG-UNIT per tablet  Take 1 tablet by mouth every morning.     cholecalciferol 1000 UNITS tablet  Commonly known as:  VITAMIN D  Take 1,000 Units by mouth every morning.     Cinnamon 500 MG Tabs  Take 1,000 mg by mouth every morning.     CO Q 10 PO  Take 1 capsule by mouth every morning.     Fish Oil 1000 MG Caps  Take 1,000 mg by mouth every morning.     LORazepam 2 MG tablet  Commonly known  as:  ATIVAN  Take 2 mg by mouth 2 (two) times daily.     MAGNESIUM OXIDE PO  Take 2 tablets by mouth every morning.     nitroGLYCERIN 0.4 MG SL tablet  Commonly known as:  NITROSTAT  Place 0.4 mg under the tongue every 5 (five) minutes as needed for chest pain.     polyethylene glycol packet  Commonly known as:  MIRALAX / GLYCOLAX  Take 17 g by mouth 2 (two) times daily.     quinapril 20 MG tablet  Commonly known as:  ACCUPRIL  Take 20 mg by mouth every morning.        Please HPI for pertinent positives, otherwise complete 10 system ROS negative.  Physical Exam: BP 119/71  Pulse 89  Temp(Src) 99.5 F (37.5 C) (Oral)  Resp 20  SpO2 95% There is no weight on file to calculate BMI.   General Appearance:  Alert, cooperative, no distress, appears stated age. Obese  Head:  Normocephalic, without obvious abnormality, atraumatic  ENT: Unremarkable  Neck: Supple, symmetrical, trachea midline  Lungs:   Clear to auscultation bilaterally, no w/r/r, respirations unlabored without use of accessory muscles.  Chest Wall:  No tenderness or deformity  Heart:   Regular rate and rhythm, S1, S2 normal, no murmur, rub or gallop.  Abdomen:   Soft, non-tender, non distended.  Neurologic: Normal affect, no gross deficits.   Results for orders placed during the hospital encounter of 06/04/13 (from the past 48 hour(s))  APTT     Status: None   Collection Time    06/04/13 12:35 PM      Result Value Ref Range   aPTT 25  24 - 37 seconds  CBC WITH DIFFERENTIAL     Status: Abnormal   Collection Time    06/04/13 12:35 PM      Result Value Ref Range   WBC 8.2  4.0 - 10.5 K/uL   RBC 4.82  3.87 - 5.11 MIL/uL   Hemoglobin 12.8  12.0 - 15.0 g/dL   HCT 38.8  36.0 - 46.0 %   MCV 80.5  78.0 - 100.0 fL   MCH 26.6  26.0 - 34.0 pg   MCHC 33.0  30.0 - 36.0 g/dL   RDW 16.9 (*) 11.5 - 15.5 %   Platelets 302  150 - 400 K/uL   Neutrophils Relative % 68  43 - 77 %   Neutro Abs 5.6  1.7 - 7.7 K/uL   Lymphocytes Relative 17  12 - 46 %   Lymphs Abs 1.4  0.7 - 4.0 K/uL   Monocytes Relative 8  3 - 12 %   Monocytes Absolute 0.7  0.1 - 1.0 K/uL   Eosinophils Relative 7 (*) 0 - 5 %   Eosinophils Absolute 0.5  0.0 - 0.7 K/uL   Basophils Relative 0  0 - 1 %   Basophils Absolute 0.0  0.0 - 0.1 K/uL  PROTIME-INR     Status: None   Collection Time    06/04/13 12:35 PM      Result Value Ref Range   Prothrombin Time 13.1  11.6 - 15.2 seconds   INR 1.01  0.00 - 1.49   No results found.  Assessment/Plan Lymphoma For Port placement Discussed procedure, risks, complications, use of sedation. Labs reviewed, ok Consent signed in chart  Ascencion Dike PA-C 06/04/2013, 12:57 PM

## 2013-06-05 ENCOUNTER — Ambulatory Visit: Payer: Medicare HMO | Admitting: Hematology and Oncology

## 2013-06-05 ENCOUNTER — Ambulatory Visit (HOSPITAL_BASED_OUTPATIENT_CLINIC_OR_DEPARTMENT_OTHER): Payer: Medicare HMO | Admitting: Hematology and Oncology

## 2013-06-05 ENCOUNTER — Telehealth: Payer: Self-pay | Admitting: *Deleted

## 2013-06-05 ENCOUNTER — Other Ambulatory Visit: Payer: Medicare HMO

## 2013-06-05 ENCOUNTER — Telehealth: Payer: Self-pay | Admitting: Hematology and Oncology

## 2013-06-05 ENCOUNTER — Other Ambulatory Visit: Payer: Self-pay | Admitting: *Deleted

## 2013-06-05 ENCOUNTER — Other Ambulatory Visit: Payer: Self-pay | Admitting: Hematology and Oncology

## 2013-06-05 ENCOUNTER — Ambulatory Visit (HOSPITAL_COMMUNITY): Payer: Medicare HMO

## 2013-06-05 ENCOUNTER — Encounter (HOSPITAL_COMMUNITY): Payer: Self-pay

## 2013-06-05 ENCOUNTER — Ambulatory Visit (HOSPITAL_COMMUNITY)
Admission: RE | Admit: 2013-06-05 | Discharge: 2013-06-05 | Disposition: A | Discharge status: Home / Self Care | Payer: Medicare HMO | Source: Ambulatory Visit | Attending: Hematology and Oncology | Admitting: Hematology and Oncology

## 2013-06-05 VITALS — BP 119/63 | HR 89 | Temp 98.0°F | Resp 18 | Ht 65.0 in | Wt 222.2 lb

## 2013-06-05 DIAGNOSIS — C8589 Other specified types of non-Hodgkin lymphoma, extranodal and solid organ sites: Secondary | ICD-10-CM | POA: Insufficient documentation

## 2013-06-05 DIAGNOSIS — C8581 Other specified types of non-Hodgkin lymphoma, lymph nodes of head, face, and neck: Secondary | ICD-10-CM

## 2013-06-05 DIAGNOSIS — C8330 Diffuse large B-cell lymphoma, unspecified site: Secondary | ICD-10-CM

## 2013-06-05 DIAGNOSIS — R599 Enlarged lymph nodes, unspecified: Secondary | ICD-10-CM | POA: Insufficient documentation

## 2013-06-05 DIAGNOSIS — Z9089 Acquired absence of other organs: Secondary | ICD-10-CM | POA: Insufficient documentation

## 2013-06-05 DIAGNOSIS — Z9010 Acquired absence of unspecified breast and nipple: Secondary | ICD-10-CM | POA: Insufficient documentation

## 2013-06-05 DIAGNOSIS — C8591 Non-Hodgkin lymphoma, unspecified, lymph nodes of head, face, and neck: Secondary | ICD-10-CM

## 2013-06-05 DIAGNOSIS — I2510 Atherosclerotic heart disease of native coronary artery without angina pectoris: Secondary | ICD-10-CM | POA: Insufficient documentation

## 2013-06-05 LAB — GLUCOSE, CAPILLARY: Glucose-Capillary: 146 mg/dL — ABNORMAL HIGH (ref 70–99)

## 2013-06-05 MED ORDER — FLUDEOXYGLUCOSE F - 18 (FDG) INJECTION
8.7000 | Freq: Once | INTRAVENOUS | Status: AC | PRN
  Administered 2013-06-05: 8.7 via INTRAVENOUS

## 2013-06-05 MED ORDER — ALLOPURINOL 300 MG PO TABS: 300.0000 mg | ORAL_TABLET | Freq: Every day | ORAL | Status: DC

## 2013-06-05 MED ORDER — ONDANSETRON HCL 8 MG PO TABS: 8.0000 mg | ORAL_TABLET | Freq: Two times a day (BID) | ORAL | Status: DC

## 2013-06-05 MED ORDER — LIDOCAINE-PRILOCAINE 2.5-2.5 % EX CREA: 1.0000 "application " | TOPICAL_CREAM | CUTANEOUS | Status: DC | PRN

## 2013-06-05 MED ORDER — PROCHLORPERAZINE MALEATE 10 MG PO TABS: 10.0000 mg | ORAL_TABLET | Freq: Four times a day (QID) | ORAL | Status: DC | PRN

## 2013-06-05 MED ORDER — ACYCLOVIR 400 MG PO TABS: 400.0000 mg | ORAL_TABLET | Freq: Every day | ORAL | Status: DC

## 2013-06-05 NOTE — Patient Instructions (Signed)
Bendamustine Injection What is this medicine? BENDAMUSTINE (BEN da MUS teen) is a chemotherapy drug. It is used to treat chronic lymphocytic leukemia and non-Hodgkin lymphoma. This medicine may be used for other purposes; ask your health care provider or pharmacist if you have questions. COMMON BRAND NAME(S): Treanda What should I tell my health care provider before I take this medicine? They need to know if you have any of these conditions: -kidney disease -liver disease -an unusual or allergic reaction to bendamustine, mannitol, other medicines, foods, dyes, or preservatives -pregnant or trying to get pregnant -breast-feeding How should I use this medicine? This medicine is for infusion into a vein. It is given by a health care professional in a hospital or clinic setting. Talk to your pediatrician regarding the use of this medicine in children. Special care may be needed. Overdosage: If you think you have taken too much of this medicine contact a poison control center or emergency room at once. NOTE: This medicine is only for you. Do not share this medicine with others. What if I miss a dose? It is important not to miss your dose. Call your doctor or health care professional if you are unable to keep an appointment. What may interact with this medicine? Do not take this medicine with any of the following medications: -clozapine This medicine may also interact with the following medications: -atazanavir -cimetidine -ciprofloxacin -enoxacin -fluvoxamine -medicines for seizures like carbamazepine and phenobarbital -mexiletine -rifampin -tacrine -thiabendazole -zileuton This list may not describe all possible interactions. Give your health care provider a list of all the medicines, herbs, non-prescription drugs, or dietary supplements you use. Also tell them if you smoke, drink alcohol, or use illegal drugs. Some items may interact with your medicine. What should I watch for while  using this medicine? Your condition will be monitored carefully while you are receiving this medicine. This drug may make you feel generally unwell. This is not uncommon, as chemotherapy can affect healthy cells as well as cancer cells. Report any side effects. Continue your course of treatment even though you feel ill unless your doctor tells you to stop. Call your doctor or health care professional for advice if you get a fever, chills or sore throat, or other symptoms of a cold or flu. Do not treat yourself. This drug decreases your body's ability to fight infections. Try to avoid being around people who are sick. This medicine may increase your risk to bruise or bleed. Call your doctor or health care professional if you notice any unusual bleeding. Be careful brushing and flossing your teeth or using a toothpick because you may get an infection or bleed more easily. If you have any dental work done, tell your dentist you are receiving this medicine. Avoid taking products that contain aspirin, acetaminophen, ibuprofen, naproxen, or ketoprofen unless instructed by your doctor. These medicines may hide a fever. Do not become pregnant while taking this medicine. Women should inform their doctor if they wish to become pregnant or think they might be pregnant. There is a potential for serious side effects to an unborn child. Men should inform their doctors if they wish to father a child. This medicine may lower sperm counts. Talk to your health care professional or pharmacist for more information. Do not breast-feed an infant while taking this medicine. What side effects may I notice from receiving this medicine? Side effects that you should report to your doctor or health care professional as soon as possible: -allergic reactions like skin rash,  information. Do not breast-feed an infant while taking this medicine.  What side effects may I notice from receiving this medicine?  Side effects that you should report to your doctor or health care professional as soon as possible:  -allergic reactions like skin rash, itching or hives, swelling of the face, lips, or tongue  -low blood counts - this medicine may decrease the number of white blood cells, red blood cells and  platelets. You may be at increased risk for infections and bleeding.  -signs of infection - fever or chills, cough, sore throat, pain or difficulty passing urine  -signs of decreased platelets or bleeding - bruising, pinpoint red spots on the skin, black, tarry stools, blood in the urine  -signs of decreased red blood cells - unusually weak or tired, fainting spells, lightheadedness  -trouble passing urine or change in the amount of urine  Side effects that usually do not require medical attention (report to your doctor or health care professional if they continue or are bothersome):  -diarrhea  This list may not describe all possible side effects. Call your doctor for medical advice about side effects. You may report side effects to FDA at 1-800-FDA-1088.  Where should I keep my medicine?  This drug is given in a hospital or clinic and will not be stored at home.  NOTE: This sheet is a summary. It may not cover all possible information. If you have questions about this medicine, talk to your doctor, pharmacist, or health care provider.  © 2014, [employer]. (2011-04-30 14:15:47)  Rituximab injection  What is this medicine?  RITUXIMAB (ri TUX i mab) is a monoclonal antibody. This medicine changes the way the body's immune system works. It is used commonly to treat non-Hodgkin's lymphoma and other conditions. In cancer cells, this drug targets a specific protein within cancer cells and stops the cancer cells from growing. It is also used to treat rhuematoid arthritis (RA). In RA, this medicine slow the inflammatory process and help reduce joint pain and swelling. This medicine is often used with other cancer or arthritis medications.  This medicine may be used for other purposes; ask your health care provider or pharmacist if you have questions.  COMMON BRAND NAME(S): Rituxan  What should I tell my health care provider before I take this medicine?  They need to know if you have any of these  conditions:  -blood disorders  -heart disease  -history of hepatitis B  -infection (especially a virus infection such as chickenpox, cold sores, or herpes)  -irregular heartbeat  -kidney disease  -lung or breathing disease, like asthma  -lupus  -an unusual or allergic reaction to rituximab, mouse proteins, other medicines, foods, dyes, or preservatives  -pregnant or trying to get pregnant  -breast-feeding  How should I use this medicine?  This medicine is for infusion into a vein. It is administered in a hospital or clinic by a specially trained health care professional.  A special MedGuide will be given to you by the pharmacist with each prescription and refill. Be sure to read this information carefully each time.  Talk to your pediatrician regarding the use of this medicine in children. This medicine is not approved for use in children.  Overdosage: If you think you have taken too much of this medicine contact a poison control center or emergency room at once.  NOTE: This medicine is only for you. Do not share this medicine with others.  What if I miss a dose?  It   is important not to miss a dose. Call your doctor or health care professional if you are unable to keep an appointment.  What may interact with this medicine?  -cisplatin  -medicines for blood pressure  -some other medicines for arthritis  -vaccines  This list may not describe all possible interactions. Give your health care provider a list of all the medicines, herbs, non-prescription drugs, or dietary supplements you use. Also tell them if you smoke, drink alcohol, or use illegal drugs. Some items may interact with your medicine.  What should I watch for while using this medicine?  Report any side effects that you notice during your treatment right away, such as changes in your breathing, fever, chills, dizziness or lightheadedness. These effects are more common with the first dose.  Visit your prescriber or health care professional for checks on your  progress. You will need to have regular blood work. Report any other side effects. The side effects of this medicine can continue after you finish your treatment. Continue your course of treatment even though you feel ill unless your doctor tells you to stop.  Call your doctor or health care professional for advice if you get a fever, chills or sore throat, or other symptoms of a cold or flu. Do not treat yourself. This drug decreases your body's ability to fight infections. Try to avoid being around people who are sick.  This medicine may increase your risk to bruise or bleed. Call your doctor or health care professional if you notice any unusual bleeding.  Be careful brushing and flossing your teeth or using a toothpick because you may get an infection or bleed more easily. If you have any dental work done, tell your dentist you are receiving this medicine.  Avoid taking products that contain aspirin, acetaminophen, ibuprofen, naproxen, or ketoprofen unless instructed by your doctor. These medicines may hide a fever.  Do not become pregnant while taking this medicine. Women should inform their doctor if they wish to become pregnant or think they might be pregnant. There is a potential for serious side effects to an unborn child. Talk to your health care professional or pharmacist for more information. Do not breast-feed an infant while taking this medicine.  What side effects may I notice from receiving this medicine?  Side effects that you should report to your doctor or health care professional as soon as possible:  -allergic reactions like skin rash, itching or hives, swelling of the face, lips, or tongue  -low blood counts - this medicine may decrease the number of white blood cells, red blood cells and platelets. You may be at increased risk for infections and bleeding.  -signs of infection - fever or chills, cough, sore throat, pain or difficulty passing urine  -signs of decreased platelets or bleeding -  bruising, pinpoint red spots on the skin, black, tarry stools, blood in the urine  -signs of decreased red blood cells - unusually weak or tired, fainting spells, lightheadedness  -breathing problems  -confused, not responsive  -chest pain  -fast, irregular heartbeat  -feeling faint or lightheaded, falls  -mouth sores  -redness, blistering, peeling or loosening of the skin, including inside the mouth  -stomach pain  -swelling of the ankles, feet, or hands  -trouble passing urine or change in the amount of urine  Side effects that usually do not require medical attention (report to your doctor or other health care professional if they continue or are bothersome):  -anxiety  -headache  -loss

## 2013-06-05 NOTE — Telephone Encounter (Signed)
Pt was unable to stay for her chemotherapy education class today.  She was exhausted after her appts today as was her elderly friend who drove her here today.  Gave pt information booklet regarding chemotherapy from the chemo education nurse.  Specific hand outs on Rituxan and Treanda given to pt.. Reviewed how to use the EMLA cream for her portacath.  Instructed pt to hold her BP medication Accupril the first day of chemo on Thursday morning 4/23.  Pt verbalized understanding but will require reinforcement.  She has a difficult time focusing on one topic at a time and her conversations wander abruptly.   Instructed pt on using EMLA tomorrow morning prior to BMBx,  Informing they will use pt's Port for blood draw and medications.  She verbalized understanding.

## 2013-06-05 NOTE — Telephone Encounter (Signed)
Per staff message and POF I have scheduled appts.  JMW  

## 2013-06-05 NOTE — Telephone Encounter (Signed)
Gave pt appt fpr lab,md anc chemo for April and May 2015

## 2013-06-05 NOTE — Progress Notes (Signed)
[facility] OFFICE PROGRESS NOTE  Patient Care Team: Tivis Ringer, MD as PCP - General (Internal Medicine)  DIAGNOSIS: Diffuse large B-cell lymphoma, stage III  SUMMARY OF ONCOLOGIC HISTORY: Oncology History   Lymphoma, diffuse large B cell   Primary site: Lymphoid Neoplasms   Staging method: AJCC 6th Edition   Clinical: Stage III signed by Heath Lark, MD on 05/30/2013  8:37 PM   Summary: Stage III       Lymphoma of lymph nodes of head, face, and/or neck   05/02/2013 Procedure She has fine-needle aspirate and biopsy of left cervical lymph node, suspicious for high-grade lymphoma   05/10/2013 Surgery Excisional lymph node biopsy confirmed diffuse large B-cell lymphoma   06/04/2013 Procedure The patient have placement of Infuse-a-Port   06/05/2013 Imaging PET scan showed disease both above and below the diaphragm, consistent with stage III    INTERVAL HISTORY: [name] 78 y.o. female returns for further followup. She tolerated recent port placement well.  I have reviewed the past medical history, past surgical history, social history and family history with the patient and they are unchanged from previous note.  ALLERGIES:  has No Known Allergies.  MEDICATIONS:  Current Outpatient Prescriptions  Medication Sig Dispense Refill  . amitriptyline (ELAVIL) 100 MG tablet Take 100 mg by mouth at bedtime.      Marland Kitchen anastrozole (ARIMIDEX) 1 MG tablet Take 1 mg by mouth at bedtime.      Marland Kitchen aspirin 81 MG EC tablet Take 81 mg by mouth every morning.       Marland Kitchen atorvastatin (LIPITOR) 40 MG tablet Take 40 mg by mouth every morning.       . Calcium Carbonate-Vit D-Min (CALTRATE 600+D PLUS) 600-400 MG-UNIT per tablet Take 1 tablet by mouth every morning.       . cholecalciferol (VITAMIN D) 1000 UNITS tablet Take 1,000 Units by mouth every morning.       . Cinnamon 500 MG TABS Take 1,000 mg by mouth every morning.       . Coenzyme Q10 (CO Q 10 PO) Take 1 capsule by mouth every  morning.       Marland Kitchen LORazepam (ATIVAN) 2 MG tablet Take 2 mg by mouth 2 (two) times daily.       Marland Kitchen MAGNESIUM OXIDE PO Take 2 tablets by mouth every morning.       . nitroGLYCERIN (NITROSTAT) 0.4 MG SL tablet Place 0.4 mg under the tongue every 5 (five) minutes as needed for chest pain.      . Omega-3 Fatty Acids (FISH OIL) 1000 MG CAPS Take 1,000 mg by mouth every morning.       . polyethylene glycol (MIRALAX / GLYCOLAX) packet Take 17 g by mouth 2 (two) times daily.      . quinapril (ACCUPRIL) 20 MG tablet Take 20 mg by mouth every morning.       Marland Kitchen acyclovir (ZOVIRAX) 400 MG tablet Take 1 tablet (400 mg total) by mouth daily.  30 tablet  3  . allopurinol (ZYLOPRIM) 300 MG tablet Take 1 tablet (300 mg total) by mouth daily.  30 tablet  3  . lidocaine-prilocaine (EMLA) cream Apply 1 application topically as needed.  30 g  0  . ondansetron (ZOFRAN) 8 MG tablet Take 1 tablet (8 mg total) by mouth 2 (two) times daily. Start the day after chemo for 2 days. Then take as needed for nausea or vomiting.  30 tablet  1  .  prochlorperazine (COMPAZINE) 10 MG tablet Take 1 tablet (10 mg total) by mouth every 6 (six) hours as needed (Nausea or vomiting).  30 tablet  1   No current facility-administered medications for this visit.    REVIEW OF SYSTEMS:   Constitutional: Denies fevers, chills or abnormal weight loss All other systems were reviewed with the patient and are negative.  PHYSICAL EXAMINATION: ECOG PERFORMANCE STATUS: 0 - Asymptomatic  Filed Vitals:   06/05/13 0941  BP: 119/63  Pulse: 89  Temp: 98 F (36.7 C)  Resp: 18   Filed Weights   06/05/13 0941  Weight: 222 lb 3.2 oz (100.789 kg)    GENERAL:alert, no distress and comfortable. She is morbidly obese. SKIN: skin color, texture, turgor are normal, no rashes or significant lesions. The surgical Infuse-a-Port area looks okay NEURO: alert & oriented x 3 with fluent speech, no focal motor/sensory deficits  LABORATORY DATA:  I have  reviewed the data as listed    Component Value Date/Time   NA 136* 05/09/2013 1330   NA 140 03/21/2013 1353   K 5.4* 05/09/2013 1330   K 3.9 03/21/2013 1353   CL 100 05/09/2013 1330   CL 107 04/05/2012 1158   CO2 22 05/09/2013 1330   CO2 21* 03/21/2013 1353   GLUCOSE 137* 05/09/2013 1330   GLUCOSE 125 03/21/2013 1353   GLUCOSE 144* 04/05/2012 1158   BUN 20 05/09/2013 1330   BUN 12.8 03/21/2013 1353   CREATININE 0.64 05/09/2013 1330   CREATININE 0.8 03/21/2013 1353   CALCIUM 9.1 05/09/2013 1330   CALCIUM 9.7 03/21/2013 1353   PROT 7.5 03/21/2013 1353   PROT 6.0 09/20/2012 0451   ALBUMIN 3.7 03/21/2013 1353   ALBUMIN 2.8* 09/20/2012 0451   AST 19 03/21/2013 1353   AST 14 09/20/2012 0451   ALT 31 03/21/2013 1353   ALT 19 09/20/2012 0451   ALKPHOS 288* 03/21/2013 1353   ALKPHOS 221* 09/20/2012 0451   BILITOT 0.54 03/21/2013 1353   BILITOT 0.4 09/20/2012 0451   GFRNONAA 85* 05/09/2013 1330   GFRAA >90 05/09/2013 1330    No results found for this basename: SPEP, UPEP,  kappa and lambda light chains    Lab Results  Component Value Date   WBC 8.2 06/04/2013   NEUTROABS 5.6 06/04/2013   HGB 12.8 06/04/2013   HCT 38.8 06/04/2013   MCV 80.5 06/04/2013   PLT 302 06/04/2013      Chemistry      Component Value Date/Time   NA 136* 05/09/2013 1330   NA 140 03/21/2013 1353   K 5.4* 05/09/2013 1330   K 3.9 03/21/2013 1353   CL 100 05/09/2013 1330   CL 107 04/05/2012 1158   CO2 22 05/09/2013 1330   CO2 21* 03/21/2013 1353   BUN 20 05/09/2013 1330   BUN 12.8 03/21/2013 1353   CREATININE 0.64 05/09/2013 1330   CREATININE 0.8 03/21/2013 1353      Component Value Date/Time   CALCIUM 9.1 05/09/2013 1330   CALCIUM 9.7 03/21/2013 1353   ALKPHOS 288* 03/21/2013 1353   ALKPHOS 221* 09/20/2012 0451   AST 19 03/21/2013 1353   AST 14 09/20/2012 0451   ALT 31 03/21/2013 1353   ALT 19 09/20/2012 0451   BILITOT 0.54 03/21/2013 1353   BILITOT 0.4 09/20/2012 0451       RADIOGRAPHIC STUDIES: I have personally reviewed the radiological images as listed and agreed  with the findings in the report. Nm Pet Image Initial (pi) Skull Base  To Thigh  06/05/2013   CLINICAL DATA:  Subsequent treatment strategy for lymphoma. Restaging examination.  EXAM: NUCLEAR MEDICINE PET SKULL BASE TO THIGH  TECHNIQUE: 8.7 mCi F-18 FDG was injected intravenously. Full-ring PET imaging was performed from the skull base to thigh after the radiotracer. CT data was obtained and used for attenuation correction and anatomic localization.  FASTING BLOOD GLUCOSE:  Value: 146 mg/dl  COMPARISON:  CT of the abdomen and pelvis 12/06/2012. Chest CT 07/13/2010.  FINDINGS: NECK  1 cm left level 1B (submandibular) lymph node is mildly hypermetabolic (SUVmax = 4.0).  CHEST  Multiple borderline enlarged and mildly enlarged bilateral axillary lymph nodes (right greater than left) demonstrate hypermetabolism. The largest of these is on the right side measuring up to 1.5 cm (SUVmax = 8.1). Subcarinal and right paraesophageal lymphadenopathy, most notable for a 1.4 cm short axis lymph node (SUVmax = 5.1). Focus of hypermetabolism in the left infrahilar region, apparently an enlarged left paraesophageal lymph node measuring 1.7 cm (SUVmax = 5.3). Evaluation of the lung parenchyma is suboptimal secondary to respiratory motion on this non breath held examination. However, there is mild diffuse interstitial prominence and thickening of the peribronchovascular interstitium, most pronounced in the lower lobes of the lungs bilaterally and in the inferior segment of the lingula. The possibility of underlying interstitial lung disease is difficult to entirely exclude, and may warrant further evaluation. No definite suspicious appearing hypermetabolic pulmonary nodules or masses are identified. Heart size is mildly enlarged. There is atherosclerosis of the thoracic aorta, the great vessels of the mediastinum and the coronary arteries, including calcified atherosclerotic plaque in the left anterior descending and right coronary  arteries. Small hiatal hernia. Left subclavian single-lumen porta cath with tip terminating in the distal superior vena cava. Status post right modified radical mastectomy.  ABDOMEN/PELVIS  Numerous scattered borderline enlarged and mildly enlarged retroperitoneal and mesenteric lymph nodes. Some of the most conspicuous examples of this are a 2 cm lymph node (SUVmax = 4.4)in the proximal small bowel mesentery (image 115 of series 4), and a cluster of small para-aortic lymph nodes best demonstrated on image 111 of series for, measuring up to 1.0 cm in diameter (SUVmax = 3.9). No hypermetabolic pelvic lymphadenopathy. The appearance of the liver, pancreas, spleen, bilateral adrenal glands and bilateral kidneys is unremarkable. Status post cholecystectomy. No significant volume of ascites. No pneumoperitoneum. No pathologic distention of small bowel. Uterus and ovaries are unremarkable in appearance. Urinary bladder is normal in appearance.  SKELETON  No focal hypermetabolic activity to suggest skeletal metastasis.  IMPRESSION: 1. Multifocal hypermetabolic lymphadenopathy involving the neck, chest, retroperitoneum and abdominal mesenteric lymph nodes, as above, compatible with reported clinical history of large B-cell lymphoma. 2. The lungs are poorly evaluated on today's non breath examination, but appearance could be concerning for underlying interstitial lung disease. This could be better delineated with high-resolution chest CT if clinically appropriate. 3. Status post modified radical mastectomy on the right side. Right axillary lymphadenopathy is conspicuous, and would normally be suspicious for breast cancer recurrence, however, in the setting of known B-cell lymphoma this is less likely. However, clinical correlation is suggested. 4. Atherosclerosis including left anterior descending and right coronary artery disease. 5. Status post cholecystectomy. 6. Additional incidental findings, as above.   Electronically  Signed   By: Vinnie Langton M.D.   On: 06/05/2013 10:25   Ir Fluoro Guide Cv Line Left  06/04/2013   CLINICAL DATA:  Diffuse large B-cell lymphoma  EXAM: LEFT EJ SINGLE LUMEN  POWER PORT CATHETER INSERTION  Date:  06/04/2013 4:28 PM4/20/2015 4:28 PM  Radiologist:  Jerilynn Mages. Daryll Brod, MD  Guidance:  ULTRASOUND AND FLUOROSCOPIC  FLUOROSCOPY TIME:  1 MIN 54 SECONDS  MEDICATIONS AND MEDICAL HISTORY: 2 G ANCEFadministered within 1 hour of the procedure.4 MG VERSED, 50 MCG FENTANYL  ANESTHESIA/SEDATION: 35 min  CONTRAST:  None.  COMPLICATIONS: No immediate  PROCEDURE: Informed consent was obtained from the patient following explanation of the procedure, risks, benefits and alternatives. The patient understands, agrees and consents for the procedure. All questions were addressed. A time out was performed.  Maximal barrier sterile technique utilized including caps, mask, sterile gowns, sterile gloves, large sterile drape, hand hygiene, and 2% chlorhexidine scrub.  Under sterile conditions and local anesthesia, left external jugular micropuncture venous access was performed. Access was performed with ultrasound. Images were obtained for documentation. A guide wire was inserted followed by a transitional dilator. This allowed insertion of a guide wire and catheter into the IVC. Measurements were obtained from the SVC / RA junction back to the left IJ venotomy site. In the left infraclavicular chest, a subcutaneous pocket was created over the second anterior rib. This was done under sterile conditions and local anesthesia. 1% lidocaine with epinephrine was utilized for this. A 2.5 cm incision was made in the skin. Blunt dissection was performed to create a subcutaneous pocket over the right pectoralis major muscle. The pocket was flushed with saline vigorously. There was adequate hemostasis. The port catheter was assembled and checked for leakage. The port catheter was secured in the pocket with two retention sutures. The  tubing was tunneled subcutaneously to the left venotomy site and inserted into the SVC/RA junction through a valved peel-away sheath. Position was confirmed with fluoroscopy. Images were obtained for documentation. The patient tolerated the procedure well. No immediate complications. Incisions were closed in a two layer fashion with 4 - 0 Vicryl suture. Dermabond was applied to the skin. The port catheter was accessed, blood was aspirated followed by saline and heparin flushes. Needle was removed. A dry sterile dressing was applied.  IMPRESSION: Ultrasound and fluoroscopically guided left external jugular single lumen power port catheter insertion. Tip in the SVC/RA junction. Catheter ready for use.   Electronically Signed   By: Daryll Brod M.D.   On: 06/04/2013 16:38   Ir US Guide Vasc Access Left  06/04/2013   CLINICAL DATA:  Diffuse large B-cell lymphoma  EXAM: LEFT EJ SINGLE LUMEN POWER PORT CATHETER INSERTION  Date:  06/04/2013 4:28 PM4/20/2015 4:28 PM  Radiologist:  Jerilynn Mages. Daryll Brod, MD  Guidance:  ULTRASOUND AND FLUOROSCOPIC  FLUOROSCOPY TIME:  1 MIN 54 SECONDS  MEDICATIONS AND MEDICAL HISTORY: 2 G ANCEFadministered within 1 hour of the procedure.4 MG VERSED, 50 MCG FENTANYL  ANESTHESIA/SEDATION: 35 min  CONTRAST:  None.  COMPLICATIONS: No immediate  PROCEDURE: Informed consent was obtained from the patient following explanation of the procedure, risks, benefits and alternatives. The patient understands, agrees and consents for the procedure. All questions were addressed. A time out was performed.  Maximal barrier sterile technique utilized including caps, mask, sterile gowns, sterile gloves, large sterile drape, hand hygiene, and 2% chlorhexidine scrub.  Under sterile conditions and local anesthesia, left external jugular micropuncture venous access was performed. Access was performed with ultrasound. Images were obtained for documentation. A guide wire was inserted followed by a transitional dilator.  This allowed insertion of a guide wire and catheter into the IVC. Measurements were obtained from the SVC / RA  junction back to the left IJ venotomy site. In the left infraclavicular chest, a subcutaneous pocket was created over the second anterior rib. This was done under sterile conditions and local anesthesia. 1% lidocaine with epinephrine was utilized for this. A 2.5 cm incision was made in the skin. Blunt dissection was performed to create a subcutaneous pocket over the right pectoralis major muscle. The pocket was flushed with saline vigorously. There was adequate hemostasis. The port catheter was assembled and checked for leakage. The port catheter was secured in the pocket with two retention sutures. The tubing was tunneled subcutaneously to the left venotomy site and inserted into the SVC/RA junction through a valved peel-away sheath. Position was confirmed with fluoroscopy. Images were obtained for documentation. The patient tolerated the procedure well. No immediate complications. Incisions were closed in a two layer fashion with 4 - 0 Vicryl suture. Dermabond was applied to the skin. The port catheter was accessed, blood was aspirated followed by saline and heparin flushes. Needle was removed. A dry sterile dressing was applied.  IMPRESSION: Ultrasound and fluoroscopically guided left external jugular single lumen power port catheter insertion. Tip in the SVC/RA junction. Catheter ready for use.   Electronically Signed   By: Daryll Brod M.D.   On: 06/04/2013 16:38      ASSESSMENT & PLAN:  #1 diffuse Hodge B-cell lymphoma She will complete staging with bone marrow aspirate and biopsy tomorrow. The patient has at least stage III disease. We discussed the role of chemotherapy. The intent is for cure.  We discussed some of the risks, benefits and side-effects of Rituximab and bendamustine.   Some of the short term side-effects included, though not limited to, risk of fatigue, weight loss, tumor  lysis syndrome, risk of allergic reactions, pancytopenia, life-threatening infections, need for transfusions of blood products, nausea, vomiting, change in bowel habits, hair loss, risk of congestive heart failure, admission to hospital for various reasons, and risks of death.   The patient is aware that the response rates discussed earlier is not guaranteed.    After a long discussion, patient made an informed decision to proceed with the prescribed plan of care and went ahead to sign the consent form today.   Patient education material was dispensed #2 remote history of breast cancer I told the patient to discontinue Rheumatrex #3 tumor lysis prophylaxis I was start her on allopurinol #4 antimicrobial prophylaxis I will start her on acyclovir #5 high distress score We will refer her to social worker.   Orders Placed This Encounter  Procedures  . CBC with Differential    Standing Status: Standing     Number of Occurrences: 9     Standing Expiration Date: 06/06/2014  . Comprehensive metabolic panel    Standing Status: Standing     Number of Occurrences: 9     Standing Expiration Date: 06/06/2014  . Lactate dehydrogenase    Standing Status: Standing     Number of Occurrences: 2     Standing Expiration Date: 06/06/2014  . Uric Acid    Standing Status: Standing     Number of Occurrences: 2     Standing Expiration Date: 06/06/2014  . Ambulatory referral to Social Work    Referral Priority:  Routine    Referral Type:  Consultation    Referral Reason:  Specialty Services Required    Number of Visits Requested:  1   All questions were answered. The patient knows to call the clinic with any problems, questions or  concerns. No barriers to learning was detected. I spent 45 minutes counseling the patient face to face. The total time spent in the appointment was 60 minutes and more than 50% was on counseling and review of test results     Heath Lark, MD 06/05/2013 12:40 PM

## 2013-06-06 ENCOUNTER — Encounter (HOSPITAL_COMMUNITY): Payer: Self-pay

## 2013-06-06 ENCOUNTER — Telehealth: Payer: Self-pay | Admitting: *Deleted

## 2013-06-06 ENCOUNTER — Ambulatory Visit (HOSPITAL_COMMUNITY)
Admission: RE | Admit: 2013-06-06 | Discharge: 2013-06-06 | Disposition: A | Discharge status: Home / Self Care | Payer: Medicare HMO | Source: Ambulatory Visit | Attending: Hematology and Oncology | Admitting: Hematology and Oncology

## 2013-06-06 ENCOUNTER — Other Ambulatory Visit: Payer: Self-pay | Admitting: Hematology and Oncology

## 2013-06-06 ENCOUNTER — Other Ambulatory Visit (HOSPITAL_COMMUNITY): Payer: Self-pay | Admitting: Hematology and Oncology

## 2013-06-06 VITALS — BP 128/71 | HR 83 | Temp 97.8°F | Resp 18

## 2013-06-06 DIAGNOSIS — C8590 Non-Hodgkin lymphoma, unspecified, unspecified site: Secondary | ICD-10-CM

## 2013-06-06 DIAGNOSIS — C8589 Other specified types of non-Hodgkin lymphoma, extranodal and solid organ sites: Secondary | ICD-10-CM | POA: Insufficient documentation

## 2013-06-06 DIAGNOSIS — T82598A Other mechanical complication of other cardiac and vascular devices and implants, initial encounter: Secondary | ICD-10-CM | POA: Insufficient documentation

## 2013-06-06 DIAGNOSIS — C8330 Diffuse large B-cell lymphoma, unspecified site: Secondary | ICD-10-CM

## 2013-06-06 DIAGNOSIS — Y849 Medical procedure, unspecified as the cause of abnormal reaction of the patient, or of later complication, without mention of misadventure at the time of the procedure: Secondary | ICD-10-CM | POA: Insufficient documentation

## 2013-06-06 LAB — HEPATITIS B SURFACE ANTIGEN: Hepatitis B Surface Ag: NEGATIVE

## 2013-06-06 LAB — COMPREHENSIVE METABOLIC PANEL
ALBUMIN: 3.4 g/dL — AB (ref 3.5–5.2)
ALK PHOS: 231 U/L — AB (ref 39–117)
ALT: 23 U/L (ref 0–35)
AST: 18 U/L (ref 0–37)
BUN: 15 mg/dL (ref 6–23)
CALCIUM: 9.1 mg/dL (ref 8.4–10.5)
CO2: 23 mEq/L (ref 19–32)
Chloride: 103 mEq/L (ref 96–112)
Creatinine, Ser: 0.69 mg/dL (ref 0.50–1.10)
GFR calc Af Amer: >90 mL/min (ref >90)
GFR calc non Af Amer: 82 mL/min — ABNORMAL LOW (ref >90)
Glucose, Bld: 128 mg/dL — ABNORMAL HIGH (ref 70–99)
POTASSIUM: 4.2 meq/L (ref 3.7–5.3)
Sodium: 138 mEq/L (ref 137–147)
TOTAL PROTEIN: 6.7 g/dL (ref 6.0–8.3)
Total Bilirubin: 0.4 mg/dL (ref 0.3–1.2)

## 2013-06-06 LAB — CBC WITH DIFFERENTIAL/PLATELET
BASOS ABS: 0 10*3/uL (ref 0.0–0.1)
Basophils Relative: 0 % (ref 0–1)
EOS PCT: 6 % — AB (ref 0–5)
Eosinophils Absolute: 0.5 10*3/uL (ref 0.0–0.7)
HEMATOCRIT: 36.6 % (ref 36.0–46.0)
Hemoglobin: 11.9 g/dL — ABNORMAL LOW (ref 12.0–15.0)
LYMPHS ABS: 1.2 10*3/uL (ref 0.7–4.0)
LYMPHS PCT: 14 % (ref 12–46)
MCH: 26.4 pg (ref 26.0–34.0)
MCHC: 32.5 g/dL (ref 30.0–36.0)
MCV: 81.2 fL (ref 78.0–100.0)
MONO ABS: 0.7 10*3/uL (ref 0.1–1.0)
Monocytes Relative: 8 % (ref 3–12)
Neutro Abs: 6 10*3/uL (ref 1.7–7.7)
Neutrophils Relative %: 72 % (ref 43–77)
PLATELETS: 275 10*3/uL (ref 150–400)
RBC: 4.51 MIL/uL (ref 3.87–5.11)
RDW: 17.2 % — AB (ref 11.5–15.5)
WBC: 8.3 10*3/uL (ref 4.0–10.5)

## 2013-06-06 LAB — URIC ACID: Uric Acid, Serum: 3.4 mg/dL (ref 2.4–7.0)

## 2013-06-06 LAB — HEPATITIS B SURFACE ANTIBODY,QUALITATIVE: HEP B S AB: NEGATIVE

## 2013-06-06 LAB — HEPATITIS B CORE ANTIBODY, IGM: HEP B C IGM: NONREACTIVE

## 2013-06-06 LAB — LACTATE DEHYDROGENASE: LDH: 215 U/L (ref 94–250)

## 2013-06-06 MED ORDER — IOHEXOL 300 MG/ML  SOLN
10.0000 mL | Freq: Once | INTRAMUSCULAR | Status: AC | PRN
  Administered 2013-06-06: 10 mL via INTRAVENOUS

## 2013-06-06 MED ORDER — HEPARIN SOD (PORK) LOCK FLUSH 100 UNIT/ML IV SOLN
INTRAVENOUS | Status: AC
  Filled 2013-06-06: qty 5

## 2013-06-06 MED ORDER — SODIUM CHLORIDE 0.9 % IV SOLN: INTRAVENOUS | Status: DC

## 2013-06-06 MED ORDER — MIDAZOLAM HCL 10 MG/2ML IJ SOLN
10.0000 mg | Freq: Once | INTRAMUSCULAR | Status: DC
  Filled 2013-06-06: qty 2

## 2013-06-06 MED ORDER — HEPARIN SOD (PORK) LOCK FLUSH 100 UNIT/ML IV SOLN: 500.0000 [IU] | INTRAVENOUS | Status: DC | PRN

## 2013-06-06 MED ORDER — MORPHINE SULFATE 10 MG/ML IJ SOLN
10.0000 mg | Freq: Once | INTRAMUSCULAR | Status: DC
  Filled 2013-06-06: qty 1

## 2013-06-06 MED ORDER — SODIUM CHLORIDE 0.9 % IJ SOLN: 10.0000 mL | INTRAMUSCULAR | Status: DC | PRN

## 2013-06-06 NOTE — Progress Notes (Addendum)
Pt arrived today at 0735 for her 0800 MD Bone Marrow Biopsy in Short Stay. New Power  PAC left chest that was inserted 06/04/13 was prepped per protocol and accessed. I was able to flush port but no blood return. Repositioned Huber Needle  underr skin in port and again able to flush but no blood return. Pt voices no discomfort. HPN withdrawn and site again prepped with new kit and access of PAC with new HPN sterille technique again able to flush but no return. Repositioned HPN in port under the skin  and again flushed with saline with no c/o from patient  but no blood return. Withdrew HPN and left chest PAC was again prepped per protocol and accessed with 20 ga x 1" Power Port needle ( skin glue still intact) flushed easliy but no blood obtained. Dr Alvy Bimler notified of this and she is postponing the Bone Marrow Biopsy and would like to have PAC evaluated in Radiolog. She would like to have lab work drawn as ordered in preparation drawn peripherally  for her Chemo to begin tomorrow.. Orders entered for this and radiology contacted. Peripheral IV site left hand with #24x 3/4 " cath and lab work drawn and tubed to lab. [facility] Needle Power Point remains intact in left chest covered with tegaderm and has been flushed with saline 10cc. Pt dozing at intervals. Awaiting call back from radiology to be transported for evaluation of PAC site Pt requesting something to eat or drink but I have kept her NPO until I hear from Radiology about evaluation of her [facility]

## 2013-06-06 NOTE — Progress Notes (Signed)
Pt was placed in wheelchair for discharge. Pt was discharged into the care of her friend Lenell Antu. Pt was INSISTANT that she go to the cafeteria for lunch before going home. I escorted pt in wheelchair and her friend Lenell Antu to door of cafeteria.

## 2013-06-06 NOTE — Discharge Instructions (Signed)
RETURN TO RADIOLOGY ON 06/07/13 AT 0800 FOR YOUR PORTACATH TO BE ACCESSED PRIOR TO YOUR CHEMO .APPLY EMLA CREAM TO PORT SITE PRIOR TO LEAVING YOU HOUSE

## 2013-06-06 NOTE — Telephone Encounter (Signed)
No answer

## 2013-06-06 NOTE — Telephone Encounter (Signed)
Per staff message and POF I have scheduled appts.  JMW  

## 2013-06-06 NOTE — Progress Notes (Signed)
Pt went to Radiology for evaluation of PAC. See Dr Geroge Baseman notes. Still unable to get blood return. Pt is to go to radiology on 06/07/13 0800 to have [place] accessed under fluro to confirm it is in the port then she is to go to the [facility] for her Chemo. This information was relayed to Tammy RN at Dr Alvy Bimler office. Pt has returned to Short Stay and PAC was flushed and deaccessed by Margaretmary Dys RN in radiology. Pt is to be discharged home Dr Geroge Baseman is currently seeing patient

## 2013-06-07 ENCOUNTER — Ambulatory Visit: Payer: Medicare HMO

## 2013-06-07 ENCOUNTER — Other Ambulatory Visit: Payer: Self-pay | Admitting: Radiology

## 2013-06-07 ENCOUNTER — Telehealth: Payer: Self-pay | Admitting: *Deleted

## 2013-06-07 NOTE — Telephone Encounter (Signed)
Pt notified of chemo appointments for next week.

## 2013-06-08 ENCOUNTER — Other Ambulatory Visit: Payer: Self-pay | Admitting: Hematology and Oncology

## 2013-06-08 ENCOUNTER — Ambulatory Visit: Payer: Medicare HMO

## 2013-06-08 ENCOUNTER — Ambulatory Visit (HOSPITAL_COMMUNITY)
Admission: RE | Admit: 2013-06-08 | Discharge: 2013-06-08 | Disposition: A | Discharge status: Home / Self Care | Payer: Medicare HMO | Source: Ambulatory Visit | Attending: Hematology and Oncology | Admitting: Hematology and Oncology

## 2013-06-08 ENCOUNTER — Encounter (HOSPITAL_COMMUNITY): Payer: Self-pay

## 2013-06-08 ENCOUNTER — Telehealth: Payer: Self-pay | Admitting: *Deleted

## 2013-06-08 DIAGNOSIS — Z9010 Acquired absence of unspecified breast and nipple: Secondary | ICD-10-CM | POA: Insufficient documentation

## 2013-06-08 DIAGNOSIS — Z853 Personal history of malignant neoplasm of breast: Secondary | ICD-10-CM | POA: Insufficient documentation

## 2013-06-08 DIAGNOSIS — Z9089 Acquired absence of other organs: Secondary | ICD-10-CM | POA: Insufficient documentation

## 2013-06-08 DIAGNOSIS — K5900 Constipation, unspecified: Secondary | ICD-10-CM | POA: Insufficient documentation

## 2013-06-08 DIAGNOSIS — F3289 Other specified depressive episodes: Secondary | ICD-10-CM | POA: Insufficient documentation

## 2013-06-08 DIAGNOSIS — Z452 Encounter for adjustment and management of vascular access device: Secondary | ICD-10-CM | POA: Insufficient documentation

## 2013-06-08 DIAGNOSIS — Z79899 Other long term (current) drug therapy: Secondary | ICD-10-CM | POA: Insufficient documentation

## 2013-06-08 DIAGNOSIS — G4730 Sleep apnea, unspecified: Secondary | ICD-10-CM | POA: Insufficient documentation

## 2013-06-08 DIAGNOSIS — I2510 Atherosclerotic heart disease of native coronary artery without angina pectoris: Secondary | ICD-10-CM | POA: Insufficient documentation

## 2013-06-08 DIAGNOSIS — E785 Hyperlipidemia, unspecified: Secondary | ICD-10-CM | POA: Insufficient documentation

## 2013-06-08 DIAGNOSIS — C8330 Diffuse large B-cell lymphoma, unspecified site: Secondary | ICD-10-CM

## 2013-06-08 DIAGNOSIS — F411 Generalized anxiety disorder: Secondary | ICD-10-CM | POA: Insufficient documentation

## 2013-06-08 DIAGNOSIS — F329 Major depressive disorder, single episode, unspecified: Secondary | ICD-10-CM | POA: Insufficient documentation

## 2013-06-08 DIAGNOSIS — I10 Essential (primary) hypertension: Secondary | ICD-10-CM | POA: Insufficient documentation

## 2013-06-08 DIAGNOSIS — K219 Gastro-esophageal reflux disease without esophagitis: Secondary | ICD-10-CM | POA: Insufficient documentation

## 2013-06-08 DIAGNOSIS — C8589 Other specified types of non-Hodgkin lymphoma, extranodal and solid organ sites: Secondary | ICD-10-CM | POA: Insufficient documentation

## 2013-06-08 MED ORDER — SODIUM CHLORIDE 0.9 % IV SOLN: Freq: Once | INTRAVENOUS | Status: DC

## 2013-06-08 MED ORDER — FENTANYL CITRATE 0.05 MG/ML IJ SOLN
INTRAMUSCULAR | Status: AC
  Filled 2013-06-08: qty 4

## 2013-06-08 MED ORDER — FENTANYL CITRATE 0.05 MG/ML IJ SOLN
INTRAMUSCULAR | Status: AC | PRN
  Administered 2013-06-08 (×2): 100 ug via INTRAVENOUS

## 2013-06-08 MED ORDER — HEPARIN SOD (PORK) LOCK FLUSH 100 UNIT/ML IV SOLN
500.0000 [IU] | Freq: Once | INTRAVENOUS | Status: AC
  Administered 2013-06-08: 500 [IU] via INTRAVENOUS
  Filled 2013-06-08: qty 5

## 2013-06-08 MED ORDER — LIDOCAINE-EPINEPHRINE (PF) 2 %-1:200000 IJ SOLN
INTRAMUSCULAR | Status: AC
  Filled 2013-06-08: qty 20

## 2013-06-08 MED ORDER — MIDAZOLAM HCL 2 MG/2ML IJ SOLN
INTRAMUSCULAR | Status: AC
  Filled 2013-06-08: qty 4

## 2013-06-08 MED ORDER — CEFAZOLIN SODIUM-DEXTROSE 2-3 GM-% IV SOLR
2.0000 g | Freq: Once | INTRAVENOUS | Status: AC
  Administered 2013-06-08: 2 g via INTRAVENOUS
  Filled 2013-06-08: qty 50

## 2013-06-08 MED ORDER — MIDAZOLAM HCL 2 MG/2ML IJ SOLN
INTRAMUSCULAR | Status: AC | PRN
  Administered 2013-06-08 (×2): 2 mg via INTRAVENOUS

## 2013-06-08 MED ORDER — IOHEXOL 300 MG/ML  SOLN
10.0000 mL | Freq: Once | INTRAMUSCULAR | Status: AC | PRN
  Administered 2013-06-08: 10 mL via INTRAVENOUS

## 2013-06-08 NOTE — Procedures (Signed)
Successful removal of left anterior chest wall port-a-cath. Successful placement of right IJ approach port-a-cath with tip at the superior caval atrial junction. The catheter is ready for immediate use. No immediate post procedural complications.

## 2013-06-08 NOTE — Telephone Encounter (Signed)
Pt had left a voice mail stating she needed to know what to take before chemo. Left her a message- she should NOT take blood pressure medicine the day of her first chemotherapy. That is the only change to her routine

## 2013-06-08 NOTE — H&P (Signed)
Chief Complaint: "I'm here for a port revison" Referring Physician:Gorsuch HPI: [name] is an 78 y.o. female with lymphoma. She is to begin chemotherapy soon. She had a port placed on 4/20 via (L)EJ. However, it did not work at her first treatment. She then had port injection which showed the catheter tip had retracted. She is now scheduled for revision with longer catheter length. PMHx and meds reviewed. Pt feels ok.   Past Medical History:  Past Medical History  Diagnosis Date  . Sleep apnea     insomnia  . Hyperlipidemia     takes Atorvastatin daily  . Chronic headache   . Osteoarthritis   . CAD (coronary artery disease)   . Chronic chest pain   . Constipation   . Malignant neoplasm of breast     right breast  . Hypertension     takes Quinapril daily  . Joint pain   . Low back pain   . Constipation     takes Miralax daily  . GERD (gastroesophageal reflux disease)     takes Nexium daily  . History of colon polyps   . Anxiety     takes Ativan daily  . Panic attacks   . Depression     takes Elavil nightly  . Shortness of breath     dyspnea, uses O2 nasal  3 liters/min. at night only  . Cancer     lymphoma    Past Surgical History:  Past Surgical History  Procedure Laterality Date  . Cholecystectomy    . Right mastectomy    . Tonsillectomy    . Knee surgery Right   . US echocardiography  10/04/2006    EF 55-60%  . Heart stent  May 2011 per pt  . Portacath placement      x 2  . Inner ear surgery    . Cardiac catheterization  2012  . Port removed    . Esophagogastroduodenoscopy    . Colonoscopy    . Cataract surgery Bilateral   . Submandibular gland excision Left 05/10/2013    Procedure: EXCISION LEFT SUBMANDIBULAR NODE;  Surgeon: Jodi Marble, MD;  Location: [facility];  Service: ENT;  Laterality: Left;  . Portacath placement  06/04/13    Family History: History reviewed. No pertinent family history.  Social History:  reports that she has never  smoked. She has never used smokeless tobacco. She reports that she does not drink alcohol or use illicit drugs.  Allergies: No Known Allergies  Medications:   Medication List    ASK your doctor about these medications       acyclovir 400 MG tablet  Commonly known as:  ZOVIRAX  Take 1 tablet (400 mg total) by mouth daily.     allopurinol 300 MG tablet  Commonly known as:  ZYLOPRIM  Take 1 tablet (300 mg total) by mouth daily.     amitriptyline 100 MG tablet  Commonly known as:  ELAVIL  Take 100 mg by mouth at bedtime.     anastrozole 1 MG tablet  Commonly known as:  ARIMIDEX  Take 1 mg by mouth at bedtime.     aspirin 81 MG EC tablet  Take 81 mg by mouth every morning.     atorvastatin 40 MG tablet  Commonly known as:  LIPITOR  Take 40 mg by mouth every morning.     CALTRATE 600+D PLUS 600-400 MG-UNIT per tablet  Take 1 tablet by mouth every morning.  cholecalciferol 1000 UNITS tablet  Commonly known as:  VITAMIN D  Take 1,000 Units by mouth every morning.     Cinnamon 500 MG Tabs  Take 1,000 mg by mouth every morning.     CO Q 10 PO  Take 1 capsule by mouth every morning.     Fish Oil 1000 MG Caps  Take 1,000 mg by mouth every morning.     ibuprofen 200 MG tablet  Commonly known as:  ADVIL,MOTRIN  Take 400 mg by mouth daily as needed for moderate pain.     influenza vac split trivalent high-dose injection  Commonly known as:  FLUZONE  Inject 0.5 mLs into the muscle once.     lidocaine-prilocaine cream  Commonly known as:  EMLA  Apply 1 application topically as needed.     LORazepam 2 MG tablet  Commonly known as:  ATIVAN  Take 2 mg by mouth 2 (two) times daily.     MAGNESIUM OXIDE PO  Take 2 tablets by mouth every morning.     nitroGLYCERIN 0.4 MG SL tablet  Commonly known as:  NITROSTAT  Place 0.4 mg under the tongue every 5 (five) minutes as needed for chest pain.     ondansetron 8 MG tablet  Commonly known as:  ZOFRAN  Take 1 tablet (8  mg total) by mouth 2 (two) times daily. Start the day after chemo for 2 days. Then take as needed for nausea or vomiting.     polyethylene glycol packet  Commonly known as:  MIRALAX / GLYCOLAX  Take 17 g by mouth 2 (two) times daily.     prochlorperazine 10 MG tablet  Commonly known as:  COMPAZINE  Take 1 tablet (10 mg total) by mouth every 6 (six) hours as needed (Nausea or vomiting).     quinapril 20 MG tablet  Commonly known as:  ACCUPRIL  Take 20 mg by mouth every morning.        Please HPI for pertinent positives, otherwise complete 10 system ROS negative.  Physical Exam: BP 124/62  Pulse 91  Temp(Src) 98.2 F (36.8 C) (Oral)  Resp 18  SpO2 93% There is no weight on file to calculate BMI.   General Appearance:  Alert, cooperative, no distress, appears stated age. Obese  Head:  Normocephalic, without obvious abnormality, atraumatic  ENT: Unremarkable  Neck: Supple, symmetrical, trachea midline  Lungs:   Clear to auscultation bilaterally, no w/r/r, respirations unlabored without use of accessory muscles.  Chest Wall:  No tenderness or deformity. (L)port site ok, incision c/d/i  Heart:  Regular rate and rhythm, S1, S2 normal, no murmur, rub or gallop.  Abdomen:   Soft, non-tender, non distended.  Neurologic: Normal affect, no gross deficits.   No results found for this or any previous visit (from the past 48 hour(s)). No results found.  Assessment/Plan Lymphoma For Port revision Discussed procedure, risks, complications, use of sedation. Labs reviewed from 4/20 and 4/22, ok Consent signed in chart  Ascencion Dike PA-C 06/08/2013, 11:06 AM

## 2013-06-08 NOTE — Discharge Instructions (Signed)
Implanted Port Home Guide °An implanted port is a type of central line that is placed under the skin. Central lines are used to provide IV access when treatment or nutrition needs to be given through a person's veins. Implanted ports are used for long-term IV access. An implanted port may be placed because:  °· You need IV medicine that would be irritating to the small veins in your hands or arms.   °· You need long-term IV medicines, such as antibiotics.   °· You need IV nutrition for a long period.   °· You need frequent blood draws for lab tests.   °· You need dialysis.   °Implanted ports are usually placed in the chest area, but they can also be placed in the upper arm, the abdomen, or the leg. An implanted port has two main parts:  °· Reservoir. The reservoir is round and will appear as a small, raised area under your skin. The reservoir is the part where a needle is inserted to give medicines or draw blood.   °· Catheter. The catheter is a thin, flexible tube that extends from the reservoir. The catheter is placed into a large vein. Medicine that is inserted into the reservoir goes into the catheter and then into the vein.   °HOW WILL I CARE FOR MY INCISION SITE? °Do not get the incision site wet. Bathe or shower as directed by your health care provider.  °HOW IS MY PORT ACCESSED? °Special steps must be taken to access the port:  °· Before the port is accessed, a numbing cream can be placed on the skin. This helps numb the skin over the port site.   °· Your health care provider uses a sterile technique to access the port. °· Your health care provider must put on a mask and sterile gloves. °· The skin over your port is cleaned carefully with an antiseptic and allowed to dry. °· The port is gently pinched between sterile gloves, and a needle is inserted into the port. °· Only "non-coring" port needles should be used to access the port. Once the port is accessed, a blood return should be checked. This helps  ensure that the port is in the vein and is not clogged.   °· If your port needs to remain accessed for a constant infusion, a clear (transparent) bandage will be placed over the needle site. The bandage and needle will need to be changed every week, or as directed by your health care provider.   °· Keep the bandage covering the needle clean and dry. Do not get it wet. Follow your health care provider's instructions on how to take a shower or bath while the port is accessed.   °· If your port does not need to stay accessed, no bandage is needed over the port.   °WHAT IS FLUSHING? °Flushing helps keep the port from getting clogged. Follow your health care provider's instructions on how and when to flush the port. Ports are usually flushed with saline solution or a medicine called heparin. The need for flushing will depend on how the port is used.  °· If the port is used for intermittent medicines or blood draws, the port will need to be flushed:   °· After medicines have been given.   °· After blood has been drawn.   °· As part of routine maintenance.   °· If a constant infusion is running, the port may not need to be flushed.   °HOW LONG WILL MY PORT STAY IMPLANTED? °The port can stay in for as long as your health care   provider thinks it is needed. When it is time for the port to come out, surgery will be done to remove it. The procedure is similar to the one performed when the port was put in.  °WHEN SHOULD I SEEK IMMEDIATE MEDICAL CARE? °When you have an implanted port, you should seek immediate medical care if:  °· You notice a bad smell coming from the incision site.   °· You have swelling, redness, or drainage at the incision site.   °· You have more swelling or pain at the port site or the surrounding area.   °· You have a fever that is not controlled with medicine. °Document Released: 02/01/2005 Document Revised: 11/22/2012 Document Reviewed: 10/09/2012 °ExitCare® Patient Information ©2014 [employer]  LL[employer]. °Implanted Port Insertion, Care After °Refer to this sheet in the next few weeks. These instructions provide you with information on caring for yourself after your procedure. Your health care provider may also give you more specific instructions. Your treatment has been planned according to current medical practices, but problems sometimes occur. Call your health care provider if you have any problems or questions after your procedure. °WHAT TO EXPECT AFTER THE PROCEDURE °After your procedure, it is typical to have the following:  °· Discomfort at the port insertion site. Ice packs to the area will help. °· Bruising on the skin over the port. This will subside in 3 4 days. °HOME CARE INSTRUCTIONS °· After your port is placed, you will get a manufacturer's information card. The card has information about your port. Keep this card with you at all times.   °· Know what kind of port you have. There are many types of ports available.   °· Wear a medical alert bracelet in case of an emergency. This can help alert health care workers that you have a port.   °· The port can stay in for as long as your health care provider believes it is necessary.   °· A home health care nurse may give medicines and take care of the port.   °· You or a family member can get special training and directions for giving medicine and taking care of the port at home.   °SEEK MEDICAL CARE IF:  °Your port does not flush or you are unable to get a blood return.    °SEEK IMMEDIATE MEDICAL CARE IF: °· You have new fluid or pus coming from your incision.   °· You notice a bad smell coming from your incision site.   °· You have swelling, pain, or more redness at the incision or port site.   °· You have a fever or chills.   °· You have chest pain or shortness of breath. °Document Released: 11/22/2012 Document Reviewed: 10/09/2012 °ExitCare® Patient Information ©2014 [employer]. °Moderate Sedation, Adult °Moderate sedation is given to help you  relax or even sleep through a procedure. You may remain sleepy, be clumsy, or have poor balance for several hours following this procedure. Arrange for a responsible adult, family member, or friend to take you home. A responsible adult should stay with you for at least 24 hours or until the medicines have worn off. °· Do not participate in any activities where you could become injured for the next 24 hours, or until you feel normal again. Do not: °· Drive. °· Swim. °· Ride a bicycle. °· Operate heavy machinery. °· Cook. °· Use power tools. °· Climb ladders. °· Work at heights. °· Do not make important decisions or sign legal documents until you are improved. °· Vomiting may occur if you eat   too soon. When you can drink without vomiting, try water, juice, or soup. Try solid foods if you feel little or no nausea. °· Only take over-the-counter or prescription medications for pain, discomfort, or fever as directed by your caregiver.If pain medications have been prescribed for you, ask your caregiver how soon it is safe to take them. °· Make sure you and your family fully understands everything about the medication given to you. Make sure you understand what side effects may occur. °· You should not drink alcohol, take sleeping pills, or medications that cause drowsiness for at least 24 hours. °· If you smoke, do not smoke alone. °· If you are feeling better, you may resume normal activities 24 hours after receiving sedation. °· Keep all appointments as scheduled. Follow all instructions. °· Ask questions if you do not understand. °SEEK MEDICAL CARE IF:  °· Your skin is pale or bluish in color. °· You continue to feel sick to your stomach (nauseous) or throw up (vomit). °· Your pain is getting worse and not helped by medication. °· You have bleeding or swelling. °· You are still sleepy or feeling clumsy after 24 hours. °SEEK IMMEDIATE MEDICAL CARE IF:  °· You develop a rash. °· You have difficulty breathing. °· You  develop any type of allergic problem. °· You have a fever. °Document Released: 10/27/2000 Document Revised: 04/26/2011 Document Reviewed: 10/09/2012 °ExitCare® Patient Information ©2014 [employer]. ° °

## 2013-06-11 ENCOUNTER — Telehealth: Payer: Self-pay | Admitting: *Deleted

## 2013-06-11 ENCOUNTER — Other Ambulatory Visit (HOSPITAL_COMMUNITY): Payer: Medicare HMO

## 2013-06-11 ENCOUNTER — Ambulatory Visit (HOSPITAL_COMMUNITY): Payer: Medicare HMO

## 2013-06-11 NOTE — Telephone Encounter (Signed)
Pt left 2 VM on nurse's line this morning before 8:30 am and she also called IR twice this morning before 9 am asking about her BMBx.   Called pt back and she asked when she is supposed to have her BMBX?  Informed pt that at this point the BMBx will not be done prior to chemotherapy.  She will have one once treatment is completed per Dr. Alvy Bimler.  Pt was tearful and asked if Dr. Alvy Bimler was offended by her and if that is the reason she is not doing the BMBx?  Assured pt at length that is not the reason.  Explained that it is more important for pt to start treatment tomorrow as scheduled than wait another week to get BMBx re-scheduled.  Explained this is the best thing for pt at this point to start treatment as soon as possible w/o further delay.  Reviewed pt's schedule tomorrow.  Reminded her to hold her BP medication tomorrow morning prior to chemo (Rituxan).  Pt verbalized understanding.

## 2013-06-12 ENCOUNTER — Encounter: Payer: Self-pay | Admitting: *Deleted

## 2013-06-12 ENCOUNTER — Ambulatory Visit (HOSPITAL_BASED_OUTPATIENT_CLINIC_OR_DEPARTMENT_OTHER): Payer: Medicare HMO

## 2013-06-12 ENCOUNTER — Other Ambulatory Visit (HOSPITAL_BASED_OUTPATIENT_CLINIC_OR_DEPARTMENT_OTHER): Payer: Medicare HMO

## 2013-06-12 VITALS — BP 121/76 | HR 106 | Temp 98.5°F | Resp 18 | Wt 228.7 lb

## 2013-06-12 DIAGNOSIS — Z5111 Encounter for antineoplastic chemotherapy: Secondary | ICD-10-CM

## 2013-06-12 DIAGNOSIS — C8591 Non-Hodgkin lymphoma, unspecified, lymph nodes of head, face, and neck: Secondary | ICD-10-CM

## 2013-06-12 DIAGNOSIS — C8581 Other specified types of non-Hodgkin lymphoma, lymph nodes of head, face, and neck: Secondary | ICD-10-CM

## 2013-06-12 DIAGNOSIS — C8330 Diffuse large B-cell lymphoma, unspecified site: Secondary | ICD-10-CM

## 2013-06-12 DIAGNOSIS — Z5112 Encounter for antineoplastic immunotherapy: Secondary | ICD-10-CM

## 2013-06-12 LAB — CBC WITH DIFFERENTIAL/PLATELET
BASO%: 0.5 % (ref 0.0–2.0)
Basophils Absolute: 0 10*3/uL (ref 0.0–0.1)
EOS ABS: 0.7 10*3/uL — AB (ref 0.0–0.5)
EOS%: 7.1 % — AB (ref 0.0–7.0)
HCT: 39.5 % (ref 34.8–46.6)
HGB: 12.9 g/dL (ref 11.6–15.9)
LYMPH%: 10.1 % — AB (ref 14.0–49.7)
MCH: 26.7 pg (ref 25.1–34.0)
MCHC: 32.6 g/dL (ref 31.5–36.0)
MCV: 81.8 fL (ref 79.5–101.0)
MONO#: 0.6 10*3/uL (ref 0.1–0.9)
MONO%: 6.3 % (ref 0.0–14.0)
NEUT%: 76 % (ref 38.4–76.8)
NEUTROS ABS: 7.5 10*3/uL — AB (ref 1.5–6.5)
PLATELETS: 274 10*3/uL (ref 145–400)
RBC: 4.83 10*6/uL (ref 3.70–5.45)
RDW: 17.4 % — AB (ref 11.2–14.5)
WBC: 9.9 10*3/uL (ref 3.9–10.3)
lymph#: 1 10*3/uL (ref 0.9–3.3)

## 2013-06-12 LAB — COMPREHENSIVE METABOLIC PANEL (CC13)
ALK PHOS: 264 U/L — AB (ref 40–150)
ALT: 29 U/L (ref 0–55)
AST: 22 U/L (ref 5–34)
Albumin: 3.5 g/dL (ref 3.5–5.0)
Anion Gap: 10 mEq/L (ref 3–11)
BILIRUBIN TOTAL: 0.49 mg/dL (ref 0.20–1.20)
BUN: 11.4 mg/dL (ref 7.0–26.0)
CO2: 25 mEq/L (ref 22–29)
Calcium: 10.3 mg/dL (ref 8.4–10.4)
Chloride: 106 mEq/L (ref 98–109)
Creatinine: 0.8 mg/dL (ref 0.6–1.1)
GLUCOSE: 149 mg/dL — AB (ref 70–140)
POTASSIUM: 4.4 meq/L (ref 3.5–5.1)
SODIUM: 140 meq/L (ref 136–145)
Total Protein: 7.3 g/dL (ref 6.4–8.3)

## 2013-06-12 LAB — URIC ACID (CC13): Uric Acid, Serum: 2.4 mg/dl — ABNORMAL LOW (ref 2.6–7.4)

## 2013-06-12 LAB — LACTATE DEHYDROGENASE (CC13): LDH: 229 U/L (ref 125–245)

## 2013-06-12 LAB — HEPATITIS B CORE ANTIBODY, IGM: HEP B C IGM: NONREACTIVE

## 2013-06-12 LAB — HEPATITIS B SURFACE ANTIBODY,QUALITATIVE: HEP B S AB: NEGATIVE

## 2013-06-12 LAB — HEPATITIS B SURFACE ANTIGEN: Hepatitis B Surface Ag: NEGATIVE

## 2013-06-12 MED ORDER — DEXAMETHASONE SODIUM PHOSPHATE 10 MG/ML IJ SOLN
10.0000 mg | Freq: Once | INTRAMUSCULAR | Status: AC
  Administered 2013-06-12: 10 mg via INTRAVENOUS

## 2013-06-12 MED ORDER — SODIUM CHLORIDE 0.9 % IV SOLN
800.0000 mg | Freq: Once | INTRAVENOUS | Status: AC
  Administered 2013-06-12: 800 mg via INTRAVENOUS
  Filled 2013-06-12: qty 80

## 2013-06-12 MED ORDER — DIPHENHYDRAMINE HCL 25 MG PO CAPS
ORAL_CAPSULE | ORAL | Status: AC
  Filled 2013-06-12: qty 2

## 2013-06-12 MED ORDER — ONDANSETRON 8 MG/50ML IVPB (CHCC)
8.0000 mg | Freq: Once | INTRAVENOUS | Status: AC
  Administered 2013-06-12: 8 mg via INTRAVENOUS

## 2013-06-12 MED ORDER — SODIUM CHLORIDE 0.9 % IV SOLN
198.0000 mg | Freq: Once | INTRAVENOUS | Status: AC
  Administered 2013-06-12: 198 mg via INTRAVENOUS
  Filled 2013-06-12: qty 2.2

## 2013-06-12 MED ORDER — DIPHENHYDRAMINE HCL 25 MG PO CAPS
50.0000 mg | ORAL_CAPSULE | Freq: Once | ORAL | Status: AC
  Administered 2013-06-12: 50 mg via ORAL

## 2013-06-12 MED ORDER — ACETAMINOPHEN 325 MG PO TABS
650.0000 mg | ORAL_TABLET | Freq: Once | ORAL | Status: AC
  Administered 2013-06-12: 650 mg via ORAL

## 2013-06-12 MED ORDER — SODIUM CHLORIDE 0.9 % IV SOLN
Freq: Once | INTRAVENOUS | Status: AC
  Administered 2013-06-12: 10:00:00 via INTRAVENOUS

## 2013-06-12 MED ORDER — ACETAMINOPHEN 325 MG PO TABS
ORAL_TABLET | ORAL | Status: AC
  Filled 2013-06-12: qty 2

## 2013-06-12 MED ORDER — HEPARIN SOD (PORK) LOCK FLUSH 100 UNIT/ML IV SOLN
500.0000 [IU] | Freq: Once | INTRAVENOUS | Status: AC | PRN
  Administered 2013-06-12: 500 [IU]
  Filled 2013-06-12: qty 5

## 2013-06-12 MED ORDER — SODIUM CHLORIDE 0.9 % IJ SOLN
10.0000 mL | INTRAMUSCULAR | Status: DC | PRN
  Administered 2013-06-12: 10 mL
  Filled 2013-06-12: qty 10

## 2013-06-12 NOTE — Patient Instructions (Signed)
[facility] Discharge Instructions for Patients Receiving Chemotherapy  Today you received the following chemotherapy agents: Rituxan, Treanda  To help prevent nausea and vomiting after your treatment, we encourage you to take your nausea medication as prescribed.    If you develop nausea and vomiting that is not controlled by your nausea medication, call the clinic.   BELOW ARE SYMPTOMS THAT SHOULD BE REPORTED IMMEDIATELY:  *FEVER GREATER THAN 100.5 F  *CHILLS WITH OR WITHOUT FEVER  NAUSEA AND VOMITING THAT IS NOT CONTROLLED WITH YOUR NAUSEA MEDICATION  *UNUSUAL SHORTNESS OF BREATH  *UNUSUAL BRUISING OR BLEEDING  TENDERNESS IN MOUTH AND THROAT WITH OR WITHOUT PRESENCE OF ULCERS  *URINARY PROBLEMS  *BOWEL PROBLEMS  UNUSUAL RASH Items with * indicate a potential emergency and should be followed up as soon as possible.  Feel free to call the clinic you have any questions or concerns. The clinic phone number is [phone].    

## 2013-06-12 NOTE — Progress Notes (Signed)
[facility] Psychosocial Distress Screening Clinical Social Work  Clinical Social Work was referred by distress screening protocol.  The patient scored a 10 on the Psychosocial Distress Thermometer which indicates severe distress. Clinical Social Worker met with patient during first infusion to assess for distress and other psychosocial needs. Patient was accompanied by a church friend.  The patient reports feeling confident with treatment plan and states she has dealt with breast cancer in the past.  She expressed no emotional problems at this time- reported her main concern was moving from her apartment in August.  CSW provided patient with information on [employer] to assist her in relocating.  The patient is very familiar with other community resources such as DSS and [employer].  ONCBCN DISTRESS SCREENING 06/05/2013  Mark the number that describes how much distress you have been experiencing in the past week 10  Family Problem type Children  Emotional problem type Nervousness/Anxiety;Adjusting to illness;Isolation/feeling alone;Feeling hopeless  Spiritual/Religous concerns type Facing my mortality;Loss of sense of purpose  Physical Problem type Sleep/insomnia;Getting around;Bathing/dressing;Breathing;Mouth sores/swallowing;Constipation/diarrhea;Tingling hands/feet;Skin dry/itchy  Physician notified of physical symptoms Yes  Referral to clinical social work Yes    Clinical Social Worker follow up needed: no  If yes, follow up plan:  CSW encouraged patient to call as needed.   Polo Riley, MSW, LCSW, OSW-C Clinical Social Worker [facility] [phone]

## 2013-06-13 ENCOUNTER — Other Ambulatory Visit: Payer: Medicare HMO

## 2013-06-13 ENCOUNTER — Ambulatory Visit (HOSPITAL_BASED_OUTPATIENT_CLINIC_OR_DEPARTMENT_OTHER): Payer: Medicare HMO

## 2013-06-13 VITALS — BP 111/69 | HR 108 | Temp 98.6°F | Resp 19

## 2013-06-13 DIAGNOSIS — C8591 Non-Hodgkin lymphoma, unspecified, lymph nodes of head, face, and neck: Secondary | ICD-10-CM

## 2013-06-13 DIAGNOSIS — Z5111 Encounter for antineoplastic chemotherapy: Secondary | ICD-10-CM

## 2013-06-13 DIAGNOSIS — C8581 Other specified types of non-Hodgkin lymphoma, lymph nodes of head, face, and neck: Secondary | ICD-10-CM

## 2013-06-13 MED ORDER — ONDANSETRON 8 MG/50ML IVPB (CHCC)
8.0000 mg | Freq: Once | INTRAVENOUS | Status: AC
  Administered 2013-06-13: 8 mg via INTRAVENOUS

## 2013-06-13 MED ORDER — SODIUM CHLORIDE 0.9 % IJ SOLN
10.0000 mL | INTRAMUSCULAR | Status: DC | PRN
  Administered 2013-06-13: 10 mL
  Filled 2013-06-13: qty 10

## 2013-06-13 MED ORDER — HEPARIN SOD (PORK) LOCK FLUSH 100 UNIT/ML IV SOLN
500.0000 [IU] | Freq: Once | INTRAVENOUS | Status: AC | PRN
  Administered 2013-06-13: 500 [IU]
  Filled 2013-06-13: qty 5

## 2013-06-13 MED ORDER — DEXAMETHASONE SODIUM PHOSPHATE 10 MG/ML IJ SOLN
10.0000 mg | Freq: Once | INTRAMUSCULAR | Status: AC
  Administered 2013-06-13: 10 mg via INTRAVENOUS

## 2013-06-13 MED ORDER — ONDANSETRON 8 MG/NS 50 ML IVPB
INTRAVENOUS | Status: AC
  Filled 2013-06-13: qty 8

## 2013-06-13 MED ORDER — SODIUM CHLORIDE 0.9 % IV SOLN
Freq: Once | INTRAVENOUS | Status: AC
  Administered 2013-06-13: 10:00:00 via INTRAVENOUS

## 2013-06-13 MED ORDER — DEXAMETHASONE SODIUM PHOSPHATE 10 MG/ML IJ SOLN
INTRAMUSCULAR | Status: AC
  Filled 2013-06-13: qty 1

## 2013-06-13 MED ORDER — SODIUM CHLORIDE 0.9 % IV SOLN
90.0000 mg/m2 | Freq: Once | INTRAVENOUS | Status: AC
  Administered 2013-06-13: 198 mg via INTRAVENOUS
  Filled 2013-06-13: qty 2.2

## 2013-06-13 NOTE — Patient Instructions (Signed)
[facility] Discharge Instructions for Patients Receiving Chemotherapy  Today you received the following chemotherapy agents: Treanda  To help prevent nausea and vomiting after your treatment, we encourage you to take your nausea medication: Compazine 10 mg every 6 hrs as needed.   If you develop nausea and vomiting that is not controlled by your nausea medication, call the clinic.   BELOW ARE SYMPTOMS THAT SHOULD BE REPORTED IMMEDIATELY:  *FEVER GREATER THAN 100.5 F  *CHILLS WITH OR WITHOUT FEVER  NAUSEA AND VOMITING THAT IS NOT CONTROLLED WITH YOUR NAUSEA MEDICATION  *UNUSUAL SHORTNESS OF BREATH  *UNUSUAL BRUISING OR BLEEDING  TENDERNESS IN MOUTH AND THROAT WITH OR WITHOUT PRESENCE OF ULCERS  *URINARY PROBLEMS  *BOWEL PROBLEMS  UNUSUAL RASH Items with * indicate a potential emergency and should be followed up as soon as possible.  Feel free to call the clinic you have any questions or concerns. The clinic phone number is [phone].    

## 2013-06-13 NOTE — Progress Notes (Signed)
Labs done 06/12/13, within tx parameters. Patient tolerated tx well.

## 2013-06-14 ENCOUNTER — Ambulatory Visit (HOSPITAL_BASED_OUTPATIENT_CLINIC_OR_DEPARTMENT_OTHER): Payer: Medicare HMO

## 2013-06-14 ENCOUNTER — Ambulatory Visit: Payer: Medicare HMO

## 2013-06-14 ENCOUNTER — Telehealth: Payer: Self-pay | Admitting: *Deleted

## 2013-06-14 VITALS — BP 131/61 | HR 102 | Temp 98.3°F

## 2013-06-14 DIAGNOSIS — Z5189 Encounter for other specified aftercare: Secondary | ICD-10-CM

## 2013-06-14 DIAGNOSIS — C8591 Non-Hodgkin lymphoma, unspecified, lymph nodes of head, face, and neck: Secondary | ICD-10-CM

## 2013-06-14 DIAGNOSIS — C8581 Other specified types of non-Hodgkin lymphoma, lymph nodes of head, face, and neck: Secondary | ICD-10-CM

## 2013-06-14 MED ORDER — PEGFILGRASTIM INJECTION 6 MG/0.6ML
6.0000 mg | Freq: Once | SUBCUTANEOUS | Status: AC
  Administered 2013-06-14: 6 mg via SUBCUTANEOUS
  Filled 2013-06-14: qty 0.6

## 2013-06-14 NOTE — Patient Instructions (Signed)
Call the office if temperature goes above 100.5.  Drink lots of fluids,  Water and decaffienated drinks.  Take Ibuprofen for pain.  Use your nausea medicines.  If pain gets worse and nausea and vomiting worsen go to the Emergency Room. Pegfilgrastim injection What is this medicine? PEGFILGRASTIM (peg fil GRA stim) helps the body make more white blood cells. It is used to prevent infection in people with low amounts of white blood cells following cancer treatment. This medicine may be used for other purposes; ask your health care provider or pharmacist if you have questions. COMMON BRAND NAME(S): Neulasta What should I tell my health care provider before I take this medicine? They need to know if you have any of these conditions: -sickle cell disease -an unusual or allergic reaction to pegfilgrastim, filgrastim, E.coli protein, other medicines, foods, dyes, or preservatives -pregnant or trying to get pregnant -breast-feeding How should I use this medicine? This medicine is for injection under the skin. It is usually given by a health care professional in a hospital or clinic setting. If you get this medicine at home, you will be taught how to prepare and give this medicine. Do not shake this medicine. Use exactly as directed. Take your medicine at regular intervals. Do not take your medicine more often than directed. It is important that you put your used needles and syringes in a special sharps container. Do not put them in a trash can. If you do not have a sharps container, call your pharmacist or healthcare provider to get one. Talk to your pediatrician regarding the use of this medicine in children. While this drug may be prescribed for children who weigh more than 45 kg for selected conditions, precautions do apply Overdosage: If you think you have taken too much of this medicine contact a poison control center or emergency room at once. NOTE: This medicine is only for you. Do not share this  medicine with others. What if I miss a dose? If you miss a dose, take it as soon as you can. If it is almost time for your next dose, take only that dose. Do not take double or extra doses. What may interact with this medicine? -lithium -medicines for growth therapy This list may not describe all possible interactions. Give your health care provider a list of all the medicines, herbs, non-prescription drugs, or dietary supplements you use. Also tell them if you smoke, drink alcohol, or use illegal drugs. Some items may interact with your medicine. What should I watch for while using this medicine? Visit your doctor for regular check ups. You will need important blood work done while you are taking this medicine. What side effects may I notice from receiving this medicine? Side effects that you should report to your doctor or health care professional as soon as possible: -allergic reactions like skin rash, itching or hives, swelling of the face, lips, or tongue -breathing problems -fever -pain, redness, or swelling where injected -shoulder pain -stomach or side pain Side effects that usually do not require medical attention (report to your doctor or health care professional if they continue or are bothersome): -aches, pains -headache -loss of appetite -nausea, vomiting -unusually tired This list may not describe all possible side effects. Call your doctor for medical advice about side effects. You may report side effects to FDA at 1-800-FDA-1088. Where should I keep my medicine? Keep out of the reach of children. Store in a refrigerator between 2 and 8 degrees C (36 and  46 degrees F). Do not freeze. Keep in carton to protect from light. Throw away this medicine if it is left out of the refrigerator for more than 48 hours. Throw away any unused medicine after the expiration date. NOTE: This sheet is a summary. It may not cover all possible information. If you have questions about this  medicine, talk to your doctor, pharmacist, or health care provider.  2014, Elsevier/Gold Standard. (2007-09-04 15:41:44)

## 2013-06-14 NOTE — Telephone Encounter (Signed)
PT. HAS HAD "STOMACH PAINS" FOR AN HOUR. SHE HAS NOT TAKEN ANY PAIN MEDICATION. NO NAUSEA OR VOMITING. PT. HAD A BOWEL MOVEMENT YESTERDAY BUT NONE TODAY. SHE WAS PASSING GAS AND BURPING EARLIER TODAY BUT NOTHING RECENTLY. HER VITAL SIGNS AT STABLE AND NO FEVER. VERBAL ORDER AND READ BACK TO DR.SHADAD- PT. NEEDS TO FORCE FLUIDS AND EAT A BLAND DIET. SHE MAY TAKE TYLENOL OR IBUPROFEN FOR PAIN. NOTIFIED PT. OF THE ABOVE INSTRUCTIONS. DISCUSSED FOODS ON A BLAND DIET. INSTRUCTED PT. TO GO TO THE EMERGENCY ROOM SHOULD HER CONDITION WORSEN. SHE VOICES UNDERSTANDING.

## 2013-06-15 ENCOUNTER — Ambulatory Visit: Payer: Medicare HMO

## 2013-06-25 ENCOUNTER — Telehealth: Payer: Self-pay | Admitting: Critical Care Medicine

## 2013-06-25 ENCOUNTER — Telehealth: Payer: Self-pay | Admitting: *Deleted

## 2013-06-25 NOTE — Telephone Encounter (Signed)
Yes, she can decide if she wants to take them or not They are medications for prophylaxis

## 2013-06-25 NOTE — Telephone Encounter (Signed)
Pt states she thinks Acyclovir and Allopurinol are causing her sleep disturbance and then also making her feel extremely tired.  She did not take them yesterday and slept better last night.  Informed pt that her fatigue and sleep disturbance likely normal side effect of chemotherapy and not these medications.  Pt convinced she is having side effects from these meds in particular and asks if it would be ok to take them every other day instead of every day?

## 2013-06-25 NOTE — Telephone Encounter (Signed)
Call from patient who reports concern that oxygen machine is malfunctioning. She is in no acute distress and talks in complete sentences.  She states that this PM when she went to turn on her Oxygen the floating ball in the meter did not come up initially and when it finally did the ball looked like a part of it was missing.  She attempted to call the home care who supplies the oxygen/equipment but states that they hung up on her.  She reports that this is "what they always do" when she has issues at night.  She states the oxygen is flowing and otherwise the machine is functioning properly.  Plan: Use oxygen as prescribed for tonight Call home care in AM to evaluate equipment Call office if no response from home care.

## 2013-06-25 NOTE — Telephone Encounter (Signed)
Left Vm for pt informing ok to take the 2 medications we discussed every other day to alleviate perceived side effects.  Instructed to call nurse back if any questions.

## 2013-06-26 ENCOUNTER — Telehealth: Payer: Self-pay | Admitting: Internal Medicine
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
## 2013-06-27 ENCOUNTER — Other Ambulatory Visit: Payer: Self-pay | Admitting: Oncology

## 2013-06-28 ENCOUNTER — Other Ambulatory Visit: Payer: Medicare HMO

## 2013-06-28 ENCOUNTER — Ambulatory Visit: Payer: Medicare HMO

## 2013-06-28 ENCOUNTER — Ambulatory Visit: Payer: Medicare HMO | Admitting: Hematology and Oncology

## 2013-06-28 ENCOUNTER — Telehealth: Payer: Self-pay | Admitting: *Deleted

## 2013-06-28 NOTE — Telephone Encounter (Signed)
Pt left VM states her schedule is wrong w/ Injection appt on 5/22 prior to her next chemo cycle.  This is incorrect.  Canceled injection appt and it can be scheduled again when pt comes in to see Dr. Alvy Bimler on 5/20.  Attempted to call pt x 2 to inform her of this and got busy signal both times.

## 2013-06-29 ENCOUNTER — Other Ambulatory Visit: Payer: Self-pay | Admitting: *Deleted

## 2013-06-29 ENCOUNTER — Ambulatory Visit: Payer: Medicare HMO

## 2013-06-29 MED ORDER — NITROGLYCERIN 0.4 MG SL SUBL: 0.4000 mg | SUBLINGUAL_TABLET | SUBLINGUAL | Status: DC | PRN

## 2013-06-30 ENCOUNTER — Ambulatory Visit: Payer: Medicare HMO

## 2013-07-02 ENCOUNTER — Other Ambulatory Visit: Payer: Self-pay | Admitting: Oncology

## 2013-07-02 ENCOUNTER — Telehealth: Payer: Self-pay | Admitting: Hematology and Oncology

## 2013-07-02 NOTE — Telephone Encounter (Signed)
returned pt call adn confirmed appt....added inj  after tx pt ok and aware

## 2013-07-04 ENCOUNTER — Encounter: Payer: Self-pay | Admitting: Hematology and Oncology

## 2013-07-04 ENCOUNTER — Telehealth: Payer: Self-pay | Admitting: *Deleted

## 2013-07-04 ENCOUNTER — Telehealth: Payer: Self-pay | Admitting: Hematology and Oncology

## 2013-07-04 ENCOUNTER — Ambulatory Visit (HOSPITAL_BASED_OUTPATIENT_CLINIC_OR_DEPARTMENT_OTHER): Payer: Medicare HMO | Admitting: Hematology and Oncology

## 2013-07-04 ENCOUNTER — Other Ambulatory Visit (HOSPITAL_BASED_OUTPATIENT_CLINIC_OR_DEPARTMENT_OTHER): Payer: Medicare HMO

## 2013-07-04 ENCOUNTER — Other Ambulatory Visit: Payer: Self-pay | Admitting: *Deleted

## 2013-07-04 VITALS — BP 112/58 | HR 107 | Temp 99.3°F | Resp 20 | Ht 65.0 in | Wt 223.6 lb

## 2013-07-04 DIAGNOSIS — R609 Edema, unspecified: Secondary | ICD-10-CM

## 2013-07-04 DIAGNOSIS — C50919 Malignant neoplasm of unspecified site of unspecified female breast: Secondary | ICD-10-CM

## 2013-07-04 DIAGNOSIS — Z853 Personal history of malignant neoplasm of breast: Secondary | ICD-10-CM

## 2013-07-04 DIAGNOSIS — C8589 Other specified types of non-Hodgkin lymphoma, extranodal and solid organ sites: Secondary | ICD-10-CM

## 2013-07-04 DIAGNOSIS — C8591 Non-Hodgkin lymphoma, unspecified, lymph nodes of head, face, and neck: Secondary | ICD-10-CM

## 2013-07-04 DIAGNOSIS — R945 Abnormal results of liver function studies: Secondary | ICD-10-CM

## 2013-07-04 DIAGNOSIS — C8330 Diffuse large B-cell lymphoma, unspecified site: Secondary | ICD-10-CM

## 2013-07-04 DIAGNOSIS — R7989 Other specified abnormal findings of blood chemistry: Secondary | ICD-10-CM

## 2013-07-04 LAB — COMPREHENSIVE METABOLIC PANEL (CC13)
ALT: 134 U/L — ABNORMAL HIGH (ref 0–55)
AST: 63 U/L — ABNORMAL HIGH (ref 5–34)
Albumin: 3.5 g/dL (ref 3.5–5.0)
Alkaline Phosphatase: 352 U/L — ABNORMAL HIGH (ref 40–150)
Anion Gap: 13 mEq/L — ABNORMAL HIGH (ref 3–11)
BUN: 11.6 mg/dL (ref 7.0–26.0)
CHLORIDE: 107 meq/L (ref 98–109)
CO2: 21 meq/L — AB (ref 22–29)
CREATININE: 0.8 mg/dL (ref 0.6–1.1)
Calcium: 9.5 mg/dL (ref 8.4–10.4)
GLUCOSE: 153 mg/dL — AB (ref 70–140)
Potassium: 4.2 mEq/L (ref 3.5–5.1)
Sodium: 140 mEq/L (ref 136–145)
Total Bilirubin: 0.57 mg/dL (ref 0.20–1.20)
Total Protein: 6.8 g/dL (ref 6.4–8.3)

## 2013-07-04 LAB — CBC WITH DIFFERENTIAL/PLATELET
BASO%: 0.6 % (ref 0.0–2.0)
Basophils Absolute: 0 10*3/uL (ref 0.0–0.1)
EOS%: 4.9 % (ref 0.0–7.0)
Eosinophils Absolute: 0.3 10*3/uL (ref 0.0–0.5)
HCT: 39.5 % (ref 34.8–46.6)
HEMOGLOBIN: 13 g/dL (ref 11.6–15.9)
LYMPH#: 0.5 10*3/uL — AB (ref 0.9–3.3)
LYMPH%: 7.1 % — ABNORMAL LOW (ref 14.0–49.7)
MCH: 27.2 pg (ref 25.1–34.0)
MCHC: 32.9 g/dL (ref 31.5–36.0)
MCV: 82.5 fL (ref 79.5–101.0)
MONO#: 0.9 10*3/uL (ref 0.1–0.9)
MONO%: 13.6 % (ref 0.0–14.0)
NEUT#: 4.7 10*3/uL (ref 1.5–6.5)
NEUT%: 73.8 % (ref 38.4–76.8)
Platelets: 250 10*3/uL (ref 145–400)
RBC: 4.78 10*6/uL (ref 3.70–5.45)
RDW: 19.4 % — ABNORMAL HIGH (ref 11.2–14.5)
WBC: 6.4 10*3/uL (ref 3.9–10.3)

## 2013-07-04 LAB — LACTATE DEHYDROGENASE (CC13): LDH: 209 U/L (ref 125–245)

## 2013-07-04 LAB — URIC ACID (CC13): URIC ACID, SERUM: 2.8 mg/dL (ref 2.6–7.4)

## 2013-07-04 NOTE — Telephone Encounter (Signed)
gv and printed appts cehd adna vs for pt for May and June...sed added tx.

## 2013-07-04 NOTE — Progress Notes (Signed)
[facility] OFFICE PROGRESS NOTE  Patient Care Team: Tivis Ringer, MD as PCP - General (Internal Medicine)  DIAGNOSIS: Diffuse large B cell lymphoma, seen prior to cycle 2 of treatment  SUMMARY OF ONCOLOGIC HISTORY: Oncology History   Lymphoma, diffuse large B cell Bone marrow aspirate and biopsy was not performed due to scheduling issues and the need to start treatment ASAP   Primary site: Lymphoid Neoplasms   Staging method: AJCC 6th Edition   Clinical: Stage III signed by Heath Lark, MD on 05/30/2013  8:37 PM   Summary: Stage III       Diffuse large B cell lymphoma   05/02/2013 Procedure She has fine-needle aspirate and biopsy of left cervical lymph node, suspicious for high-grade lymphoma   05/10/2013 Surgery Excisional lymph node biopsy confirmed diffuse large B-cell lymphoma   06/04/2013 Procedure The patient have placement of Infuse-a-Port   06/05/2013 Imaging PET scan showed disease both above and below the diaphragm, consistent with stage III   06/12/2013 -  Chemotherapy The patient will start on cycle 1 of bendamustine with rituximab.    INTERVAL HISTORY: [name] 78 y.o. female returns for further followup. She tolerated chemotherapy very well without any side effects. She denies any recent fever, chills, night sweats or abnormal weight loss She denies new lymphadenopathy. The patient denies any mouth sores, nausea, vomiting or change in bowel habits   I have reviewed the past medical history, past surgical history, social history and family history with the patient and they are unchanged from previous note.  ALLERGIES:  has No Known Allergies.  MEDICATIONS:  Current Outpatient Prescriptions  Medication Sig Dispense Refill  . acyclovir (ZOVIRAX) 400 MG tablet Take 1 tablet (400 mg total) by mouth daily.  30 tablet  3  . allopurinol (ZYLOPRIM) 300 MG tablet Take 1 tablet (300 mg total) by mouth daily.  30 tablet  3  . amitriptyline (ELAVIL) 100  MG tablet Take 100 mg by mouth at bedtime.      Marland Kitchen aspirin 81 MG EC tablet Take 81 mg by mouth every morning.       Marland Kitchen atorvastatin (LIPITOR) 40 MG tablet Take 40 mg by mouth every morning.       . Calcium Carbonate-Vit D-Min (CALTRATE 600+D PLUS) 600-400 MG-UNIT per tablet Take 1 tablet by mouth every morning.       . cholecalciferol (VITAMIN D) 1000 UNITS tablet Take 1,000 Units by mouth every morning.       . Cinnamon 500 MG TABS Take 1,000 mg by mouth every morning.       . Coenzyme Q10 (CO Q 10 PO) Take 1 capsule by mouth every morning.       Marland Kitchen ibuprofen (ADVIL,MOTRIN) 200 MG tablet Take 400 mg by mouth daily as needed for moderate pain.      Marland Kitchen lidocaine-prilocaine (EMLA) cream Apply 1 application topically as needed.  30 g  0  . LORazepam (ATIVAN) 2 MG tablet Take 2 mg by mouth 2 (two) times daily.       Marland Kitchen MAGNESIUM OXIDE PO Take 2 tablets by mouth every morning.       . nitroGLYCERIN (NITROSTAT) 0.4 MG SL tablet Place 1 tablet (0.4 mg total) under the tongue every 5 (five) minutes as needed for chest pain.  25 tablet  1  . Omega-3 Fatty Acids (FISH OIL) 1000 MG CAPS Take 1,000 mg by mouth every morning.       . ondansetron (  ZOFRAN) 8 MG tablet Take 1 tablet (8 mg total) by mouth 2 (two) times daily. Start the day after chemo for 2 days. Then take as needed for nausea or vomiting.  30 tablet  1  . polyethylene glycol (MIRALAX / GLYCOLAX) packet Take 17 g by mouth 2 (two) times daily.      . prochlorperazine (COMPAZINE) 10 MG tablet Take 1 tablet (10 mg total) by mouth every 6 (six) hours as needed (Nausea or vomiting).  30 tablet  1  . quinapril (ACCUPRIL) 20 MG tablet Take 20 mg by mouth every morning.       . influenza vac split trivalent high-dose (FLUZONE) injection Inject 0.5 mLs into the muscle once.       No current facility-administered medications for this visit.    REVIEW OF SYSTEMS:   Constitutional: Denies fevers, chills or abnormal weight loss Eyes: Denies blurriness of  vision Ears, nose, mouth, throat, and face: Denies mucositis or sore throat Respiratory: Denies cough, dyspnea or wheezes Cardiovascular: Denies palpitation, chest discomfort or lower extremity swelling Gastrointestinal:  Denies nausea, heartburn or change in bowel habits Skin: Denies abnormal skin rashes Lymphatics: Denies new lymphadenopathy or easy bruising Neurological:Denies numbness, tingling or new weaknesses Behavioral/Psych: Mood is stable, no new changes  All other systems were reviewed with the patient and are negative.  PHYSICAL EXAMINATION: ECOG PERFORMANCE STATUS: 0 - Asymptomatic  Filed Vitals:   07/04/13 0906  BP: 112/58  Pulse: 107  Temp: 99.3 F (37.4 C)  Resp: 20   Filed Weights   07/04/13 0906  Weight: 223 lb 9.6 oz (101.424 kg)    GENERAL:alert, no distress and comfortable. She is morbidly obese, sitting on the wheelchair SKIN: skin color, texture, turgor are normal, no rashes or significant lesions EYES: normal, Conjunctiva are pink and non-injected, sclera clear OROPHARYNX:no exudate, no erythema and lips, buccal mucosa, and tongue normal . No thrush NECK: supple, thyroid normal size, non-tender, without nodularity LYMPH:  no palpable lymphadenopathy in the cervical, axillary or inguinal LUNGS: clear to auscultation and percussion with normal breathing effort HEART: regular rate & rhythm and no murmurs with moderate chronic bilateral lower extremity edema ABDOMEN:abdomen soft, non-tender and normal bowel sounds Musculoskeletal:no cyanosis of digits and no clubbing . Port looks okay NEURO: alert & oriented x 3 with fluent speech, no focal motor/sensory deficits  LABORATORY DATA:  I have reviewed the data as listed    Component Value Date/Time   NA 140 06/12/2013 0851   NA 138 06/06/2013 0830   K 4.4 06/12/2013 0851   K 4.2 06/06/2013 0830   CL 103 06/06/2013 0830   CL 107 04/05/2012 1158   CO2 25 06/12/2013 0851   CO2 23 06/06/2013 0830   GLUCOSE 149*  06/12/2013 0851   GLUCOSE 128* 06/06/2013 0830   GLUCOSE 144* 04/05/2012 1158   BUN 11.4 06/12/2013 0851   BUN 15 06/06/2013 0830   CREATININE 0.8 06/12/2013 0851   CREATININE 0.69 06/06/2013 0830   CALCIUM 10.3 06/12/2013 0851   CALCIUM 9.1 06/06/2013 0830   PROT 7.3 06/12/2013 0851   PROT 6.7 06/06/2013 0830   ALBUMIN 3.5 06/12/2013 0851   ALBUMIN 3.4* 06/06/2013 0830   AST 22 06/12/2013 0851   AST 18 06/06/2013 0830   ALT 29 06/12/2013 0851   ALT 23 06/06/2013 0830   ALKPHOS 264* 06/12/2013 0851   ALKPHOS 231* 06/06/2013 0830   BILITOT 0.49 06/12/2013 0851   BILITOT 0.4 06/06/2013 0830   GFRNONAA 82* 06/06/2013  0830   GFRAA >90 06/06/2013 0830    No results found for this basename: SPEP,  UPEP,   kappa and lambda light chains    Lab Results  Component Value Date   WBC 6.4 07/04/2013   NEUTROABS 4.7 07/04/2013   HGB 13.0 07/04/2013   HCT 39.5 07/04/2013   MCV 82.5 07/04/2013   PLT 250 07/04/2013      Chemistry      Component Value Date/Time   NA 140 06/12/2013 0851   NA 138 06/06/2013 0830   K 4.4 06/12/2013 0851   K 4.2 06/06/2013 0830   CL 103 06/06/2013 0830   CL 107 04/05/2012 1158   CO2 25 06/12/2013 0851   CO2 23 06/06/2013 0830   BUN 11.4 06/12/2013 0851   BUN 15 06/06/2013 0830   CREATININE 0.8 06/12/2013 0851   CREATININE 0.69 06/06/2013 0830      Component Value Date/Time   CALCIUM 10.3 06/12/2013 0851   CALCIUM 9.1 06/06/2013 0830   ALKPHOS 264* 06/12/2013 0851   ALKPHOS 231* 06/06/2013 0830   AST 22 06/12/2013 0851   AST 18 06/06/2013 0830   ALT 29 06/12/2013 0851   ALT 23 06/06/2013 0830   BILITOT 0.49 06/12/2013 0851   BILITOT 0.4 06/06/2013 0830     ASSESSMENT & PLAN:  #1 diffuse large B-cell lymphoma I suspect she has a transformed low-grade lymphoma now to high-grade diffuse large B cell lymphoma. She had poor performance status and for this case I would recommend giving Bendamustine with rituximab as I do not think she can tolerate anthracyclines. She also had prior exposure to  epirubicin for treatment for breast cancer 12 years ago. Overall, she tolerated treatment very well. I will proceed with cycle 2 of treatment without adjustment of dosage. I plan to restage her with another PET CT scan after 3 cycles of treatment. #2 remote history of breast cancer I would discontinue Arimidex in preparation for treatments for her lymphoma Clinically, she does not have evidence of recurrence of disease. She has taken more than 12 years worth of adjuvant endocrine therapy. #3 chronic leg edema This is unchanged. Recommend close observation. #4 tumor lysis prophylaxis She will continue on allopurinol #5 elevated liver function tests This is likely chemotherapy related. Will recheck hepatic panel before proceed with chemotherapy next week #6 hyperglycemia with poorly controlled diabetes The patient is not watching her dietary intake. I recommend she reduce sugary food.  All questions were answered. The patient knows to call the clinic with any problems, questions or concerns. No barriers to learning was detected.    Heath Lark, MD 07/04/2013 9:34 AM

## 2013-07-05 NOTE — Telephone Encounter (Signed)
Scheduler states pt had a question about her chemo being scheduled every 4 weeks vs every 3 weeks. Attempted to call pt to explain that it is supposed to be every 4 weeks. Phone was busy.

## 2013-07-06 ENCOUNTER — Telehealth: Payer: Self-pay | Admitting: Nutrition

## 2013-07-06 ENCOUNTER — Ambulatory Visit: Payer: Medicare HMO

## 2013-07-06 NOTE — Telephone Encounter (Signed)
The patient had left me a message that she would like information on diabetes and nutrition to be mailed to her.  I contacted patient by telephone.  She was not available.  I left a message that I will mail Diabetic education to her.  Recommended patient be referred to the nutrition and diabetes management Center for further diabetic education if required.  Provided name and phone number for patient in case of questions.

## 2013-07-10 ENCOUNTER — Encounter (INDEPENDENT_AMBULATORY_CARE_PROVIDER_SITE_OTHER): Payer: Medicare Other | Admitting: General Surgery

## 2013-07-11 ENCOUNTER — Ambulatory Visit (HOSPITAL_BASED_OUTPATIENT_CLINIC_OR_DEPARTMENT_OTHER): Payer: Medicare HMO

## 2013-07-11 ENCOUNTER — Other Ambulatory Visit: Payer: Self-pay

## 2013-07-11 ENCOUNTER — Ambulatory Visit: Payer: Medicare HMO

## 2013-07-11 VITALS — BP 113/65 | HR 94 | Temp 97.1°F | Resp 18

## 2013-07-11 DIAGNOSIS — C8330 Diffuse large B-cell lymphoma, unspecified site: Secondary | ICD-10-CM

## 2013-07-11 DIAGNOSIS — C8591 Non-Hodgkin lymphoma, unspecified, lymph nodes of head, face, and neck: Secondary | ICD-10-CM

## 2013-07-11 DIAGNOSIS — Z5112 Encounter for antineoplastic immunotherapy: Secondary | ICD-10-CM

## 2013-07-11 DIAGNOSIS — Z5111 Encounter for antineoplastic chemotherapy: Secondary | ICD-10-CM

## 2013-07-11 DIAGNOSIS — C8581 Other specified types of non-Hodgkin lymphoma, lymph nodes of head, face, and neck: Secondary | ICD-10-CM

## 2013-07-11 LAB — COMPREHENSIVE METABOLIC PANEL (CC13)
ALK PHOS: 272 U/L — AB (ref 40–150)
ALT: 48 U/L (ref 0–55)
AST: 26 U/L (ref 5–34)
Albumin: 3.1 g/dL — ABNORMAL LOW (ref 3.5–5.0)
Anion Gap: 10 mEq/L (ref 3–11)
BUN: 10 mg/dL (ref 7.0–26.0)
CO2: 22 mEq/L (ref 22–29)
Calcium: 8.7 mg/dL (ref 8.4–10.4)
Chloride: 108 mEq/L (ref 98–109)
Creatinine: 0.7 mg/dL (ref 0.6–1.1)
Glucose: 135 mg/dl (ref 70–140)
Potassium: 4.1 mEq/L (ref 3.5–5.1)
SODIUM: 140 meq/L (ref 136–145)
TOTAL PROTEIN: 5.9 g/dL — AB (ref 6.4–8.3)
Total Bilirubin: 0.55 mg/dL (ref 0.20–1.20)

## 2013-07-11 MED ORDER — SODIUM CHLORIDE 0.9 % IV SOLN
90.0000 mg/m2 | Freq: Once | INTRAVENOUS | Status: AC
  Administered 2013-07-11: 198 mg via INTRAVENOUS
  Filled 2013-07-11: qty 2.2

## 2013-07-11 MED ORDER — DIPHENHYDRAMINE HCL 25 MG PO CAPS
50.0000 mg | ORAL_CAPSULE | Freq: Once | ORAL | Status: AC
  Administered 2013-07-11: 50 mg via ORAL

## 2013-07-11 MED ORDER — SODIUM CHLORIDE 0.9 % IV SOLN
375.0000 mg/m2 | Freq: Once | INTRAVENOUS | Status: AC
  Administered 2013-07-11: 800 mg via INTRAVENOUS
  Filled 2013-07-11: qty 80

## 2013-07-11 MED ORDER — SODIUM CHLORIDE 0.9 % IJ SOLN
10.0000 mL | INTRAMUSCULAR | Status: DC | PRN
  Administered 2013-07-11: 10 mL
  Filled 2013-07-11: qty 10

## 2013-07-11 MED ORDER — ONDANSETRON 8 MG/50ML IVPB (CHCC)
8.0000 mg | Freq: Once | INTRAVENOUS | Status: AC
  Administered 2013-07-11: 8 mg via INTRAVENOUS

## 2013-07-11 MED ORDER — ACETAMINOPHEN 325 MG PO TABS
650.0000 mg | ORAL_TABLET | Freq: Once | ORAL | Status: AC
  Administered 2013-07-11: 650 mg via ORAL

## 2013-07-11 MED ORDER — SODIUM CHLORIDE 0.9 % IV SOLN
Freq: Once | INTRAVENOUS | Status: AC
  Administered 2013-07-11: 11:00:00 via INTRAVENOUS

## 2013-07-11 MED ORDER — HEPARIN SOD (PORK) LOCK FLUSH 100 UNIT/ML IV SOLN
500.0000 [IU] | Freq: Once | INTRAVENOUS | Status: AC | PRN
  Administered 2013-07-11: 500 [IU]
  Filled 2013-07-11: qty 5

## 2013-07-11 MED ORDER — ACETAMINOPHEN 325 MG PO TABS
ORAL_TABLET | ORAL | Status: AC
  Filled 2013-07-11: qty 2

## 2013-07-11 MED ORDER — DEXAMETHASONE SODIUM PHOSPHATE 10 MG/ML IJ SOLN
INTRAMUSCULAR | Status: AC
  Filled 2013-07-11: qty 1

## 2013-07-11 MED ORDER — DIPHENHYDRAMINE HCL 25 MG PO CAPS
ORAL_CAPSULE | ORAL | Status: AC
  Filled 2013-07-11: qty 2

## 2013-07-11 MED ORDER — DEXAMETHASONE SODIUM PHOSPHATE 10 MG/ML IJ SOLN
10.0000 mg | Freq: Once | INTRAMUSCULAR | Status: AC
  Administered 2013-07-11: 10 mg via INTRAVENOUS

## 2013-07-11 MED ORDER — ONDANSETRON 8 MG/NS 50 ML IVPB
INTRAVENOUS | Status: AC
  Filled 2013-07-11: qty 8

## 2013-07-11 NOTE — Patient Instructions (Signed)
[facility] Discharge Instructions for Patients Receiving Chemotherapy  Today you received the following chemotherapy agents Rituximab and Treanda.   To help prevent nausea and vomiting after your treatment, we encourage you to take your nausea medication ad directed.    If you develop nausea and vomiting that is not controlled by your nausea medication, call the clinic.   BELOW ARE SYMPTOMS THAT SHOULD BE REPORTED IMMEDIATELY:  *FEVER GREATER THAN 100.5 F  *CHILLS WITH OR WITHOUT FEVER  NAUSEA AND VOMITING THAT IS NOT CONTROLLED WITH YOUR NAUSEA MEDICATION  *UNUSUAL SHORTNESS OF BREATH  *UNUSUAL BRUISING OR BLEEDING  TENDERNESS IN MOUTH AND THROAT WITH OR WITHOUT PRESENCE OF ULCERS  *URINARY PROBLEMS  *BOWEL PROBLEMS  UNUSUAL RASH Items with * indicate a potential emergency and should be followed up as soon as possible.  Feel free to call the clinic you have any questions or concerns. The clinic phone number is [phone].

## 2013-07-12 ENCOUNTER — Ambulatory Visit (HOSPITAL_BASED_OUTPATIENT_CLINIC_OR_DEPARTMENT_OTHER): Payer: Medicare HMO

## 2013-07-12 VITALS — BP 111/62 | HR 96 | Temp 98.4°F | Resp 18

## 2013-07-12 DIAGNOSIS — C8330 Diffuse large B-cell lymphoma, unspecified site: Secondary | ICD-10-CM

## 2013-07-12 DIAGNOSIS — Z5111 Encounter for antineoplastic chemotherapy: Secondary | ICD-10-CM

## 2013-07-12 DIAGNOSIS — C8581 Other specified types of non-Hodgkin lymphoma, lymph nodes of head, face, and neck: Secondary | ICD-10-CM

## 2013-07-12 MED ORDER — DEXAMETHASONE SODIUM PHOSPHATE 10 MG/ML IJ SOLN
INTRAMUSCULAR | Status: AC
  Filled 2013-07-12: qty 1

## 2013-07-12 MED ORDER — DEXAMETHASONE SODIUM PHOSPHATE 10 MG/ML IJ SOLN
10.0000 mg | Freq: Once | INTRAMUSCULAR | Status: AC
  Administered 2013-07-12: 10 mg via INTRAVENOUS

## 2013-07-12 MED ORDER — ONDANSETRON 8 MG/NS 50 ML IVPB
INTRAVENOUS | Status: AC
Start: 2013-07-12 — End: 2013-07-12
  Filled 2013-07-12: qty 8

## 2013-07-12 MED ORDER — ONDANSETRON 8 MG/50ML IVPB (CHCC)
8.0000 mg | Freq: Once | INTRAVENOUS | Status: AC
  Administered 2013-07-12: 8 mg via INTRAVENOUS

## 2013-07-12 MED ORDER — SODIUM CHLORIDE 0.9 % IV SOLN
90.0000 mg/m2 | Freq: Once | INTRAVENOUS | Status: AC
  Administered 2013-07-12: 198 mg via INTRAVENOUS
  Filled 2013-07-12: qty 2.2

## 2013-07-12 MED ORDER — HEPARIN SOD (PORK) LOCK FLUSH 100 UNIT/ML IV SOLN
500.0000 [IU] | Freq: Once | INTRAVENOUS | Status: AC | PRN
Start: 2013-07-12 — End: 2013-07-12
  Administered 2013-07-12: 500 [IU]
  Filled 2013-07-12: qty 5

## 2013-07-12 MED ORDER — SODIUM CHLORIDE 0.9 % IV SOLN
Freq: Once | INTRAVENOUS | Status: AC
  Administered 2013-07-12: 13:00:00 via INTRAVENOUS

## 2013-07-12 MED ORDER — SODIUM CHLORIDE 0.9 % IJ SOLN
10.0000 mL | INTRAMUSCULAR | Status: DC | PRN
  Administered 2013-07-12: 10 mL
  Filled 2013-07-12: qty 10

## 2013-07-13 ENCOUNTER — Ambulatory Visit (HOSPITAL_BASED_OUTPATIENT_CLINIC_OR_DEPARTMENT_OTHER): Payer: Medicare HMO

## 2013-07-13 ENCOUNTER — Telehealth: Payer: Self-pay | Admitting: *Deleted

## 2013-07-13 VITALS — BP 125/52 | HR 95 | Temp 98.0°F

## 2013-07-13 DIAGNOSIS — C8591 Non-Hodgkin lymphoma, unspecified, lymph nodes of head, face, and neck: Secondary | ICD-10-CM

## 2013-07-13 DIAGNOSIS — C8581 Other specified types of non-Hodgkin lymphoma, lymph nodes of head, face, and neck: Secondary | ICD-10-CM

## 2013-07-13 DIAGNOSIS — Z5189 Encounter for other specified aftercare: Secondary | ICD-10-CM

## 2013-07-13 MED ORDER — PEGFILGRASTIM INJECTION 6 MG/0.6ML
6.0000 mg | Freq: Once | SUBCUTANEOUS | Status: AC
  Administered 2013-07-13: 6 mg via SUBCUTANEOUS
  Filled 2013-07-13: qty 0.6

## 2013-07-13 NOTE — Telephone Encounter (Signed)
Message left by pt stating " I would like to speak with Dr Jana Hakim " " I had called before and I sent a letter last week that I am sure he must have by now "  Return call number left as [phone].  This note will be given to MD.  Pt's message also forward to [facility] per pt's concern as well as pt is now being seen by Dr Alvy Bimler.

## 2013-07-13 NOTE — Patient Instructions (Signed)

## 2013-07-17 ENCOUNTER — Ambulatory Visit (INDEPENDENT_AMBULATORY_CARE_PROVIDER_SITE_OTHER): Payer: Medicare HMO | Admitting: General Surgery

## 2013-07-19 ENCOUNTER — Ambulatory Visit (INDEPENDENT_AMBULATORY_CARE_PROVIDER_SITE_OTHER): Payer: Medicare HMO | Admitting: General Surgery

## 2013-07-20 ENCOUNTER — Encounter: Payer: Self-pay | Admitting: *Deleted

## 2013-07-20 ENCOUNTER — Telehealth: Payer: Self-pay | Admitting: *Deleted

## 2013-07-20 NOTE — Telephone Encounter (Signed)
June calendar mailed to patient per her request. It includes Dr Alvy Bimler, lab and chemo.

## 2013-07-24 ENCOUNTER — Telehealth: Payer: Self-pay | Admitting: *Deleted

## 2013-07-24 NOTE — Telephone Encounter (Signed)
Per Dr. Alvy Bimler,  All pt calls go to [facility].  Gave message to his assistanst,  Amy, to please return pt's call.  She states she is not feeling well.

## 2013-07-24 NOTE — Telephone Encounter (Signed)
Pt reports doesn't feel well, has "no energy"  Staying in bed all day.  Asks if Dr. Alvy Bimler can recommend some medication to make her feel better?

## 2013-08-01 ENCOUNTER — Telehealth: Payer: Self-pay | Admitting: Internal Medicine

## 2013-08-01 NOTE — Telephone Encounter (Signed)
Called spoke w/ pt. She reports she sleeps with her mouth open. She uses 3 liters O2 via [place]. She wants to know if [facility] has recs to help her bc she does not feel she is getting enouogh O2 d/t this. Please advise thanks

## 2013-08-01 NOTE — Telephone Encounter (Signed)
Many drug stores have chin straps for snoring, that would help her keep her mouth closed while she sleeps

## 2013-08-01 NOTE — Telephone Encounter (Signed)
I called pt and made aware of recs. Nothing further needed 

## 2013-08-01 NOTE — Telephone Encounter (Signed)
ATC PT line busy wcb

## 2013-08-08 ENCOUNTER — Other Ambulatory Visit (HOSPITAL_BASED_OUTPATIENT_CLINIC_OR_DEPARTMENT_OTHER): Payer: Medicare HMO

## 2013-08-08 ENCOUNTER — Ambulatory Visit: Payer: Medicare HMO

## 2013-08-08 ENCOUNTER — Ambulatory Visit (HOSPITAL_BASED_OUTPATIENT_CLINIC_OR_DEPARTMENT_OTHER): Payer: Medicare HMO

## 2013-08-08 ENCOUNTER — Telehealth: Payer: Self-pay | Admitting: Hematology and Oncology

## 2013-08-08 ENCOUNTER — Ambulatory Visit (HOSPITAL_BASED_OUTPATIENT_CLINIC_OR_DEPARTMENT_OTHER): Payer: Medicare HMO | Admitting: Hematology and Oncology

## 2013-08-08 ENCOUNTER — Encounter: Payer: Self-pay | Admitting: Hematology and Oncology

## 2013-08-08 VITALS — BP 115/57 | HR 99 | Temp 98.5°F | Resp 16 | Ht 65.0 in | Wt 223.3 lb

## 2013-08-08 VITALS — BP 108/60 | HR 86 | Temp 97.8°F | Resp 18

## 2013-08-08 DIAGNOSIS — C8591 Non-Hodgkin lymphoma, unspecified, lymph nodes of head, face, and neck: Secondary | ICD-10-CM

## 2013-08-08 DIAGNOSIS — C8330 Diffuse large B-cell lymphoma, unspecified site: Secondary | ICD-10-CM

## 2013-08-08 DIAGNOSIS — C8581 Other specified types of non-Hodgkin lymphoma, lymph nodes of head, face, and neck: Secondary | ICD-10-CM

## 2013-08-08 DIAGNOSIS — Z95828 Presence of other vascular implants and grafts: Secondary | ICD-10-CM

## 2013-08-08 DIAGNOSIS — Z5111 Encounter for antineoplastic chemotherapy: Secondary | ICD-10-CM

## 2013-08-08 DIAGNOSIS — Z5112 Encounter for antineoplastic immunotherapy: Secondary | ICD-10-CM

## 2013-08-08 DIAGNOSIS — R7989 Other specified abnormal findings of blood chemistry: Secondary | ICD-10-CM | POA: Insufficient documentation

## 2013-08-08 DIAGNOSIS — R945 Abnormal results of liver function studies: Secondary | ICD-10-CM

## 2013-08-08 LAB — CBC WITH DIFFERENTIAL/PLATELET
BASO%: 1.4 % (ref 0.0–2.0)
Basophils Absolute: 0.1 10*3/uL (ref 0.0–0.1)
EOS%: 12.9 % — ABNORMAL HIGH (ref 0.0–7.0)
Eosinophils Absolute: 1 10*3/uL — ABNORMAL HIGH (ref 0.0–0.5)
HEMATOCRIT: 38.4 % (ref 34.8–46.6)
HGB: 12.7 g/dL (ref 11.6–15.9)
LYMPH%: 12.2 % — AB (ref 14.0–49.7)
MCH: 28.5 pg (ref 25.1–34.0)
MCHC: 33.1 g/dL (ref 31.5–36.0)
MCV: 86.1 fL (ref 79.5–101.0)
MONO#: 1 10*3/uL — ABNORMAL HIGH (ref 0.1–0.9)
MONO%: 13.7 % (ref 0.0–14.0)
NEUT%: 59.8 % (ref 38.4–76.8)
NEUTROS ABS: 4.5 10*3/uL (ref 1.5–6.5)
PLATELETS: 253 10*3/uL (ref 145–400)
RBC: 4.46 10*6/uL (ref 3.70–5.45)
RDW: 21.5 % — ABNORMAL HIGH (ref 11.2–14.5)
WBC: 7.6 10*3/uL (ref 3.9–10.3)
lymph#: 0.9 10*3/uL (ref 0.9–3.3)

## 2013-08-08 LAB — COMPREHENSIVE METABOLIC PANEL (CC13)
ALK PHOS: 249 U/L — AB (ref 40–150)
ALT: 49 U/L (ref 0–55)
ANION GAP: 9 meq/L (ref 3–11)
AST: 31 U/L (ref 5–34)
Albumin: 3.3 g/dL — ABNORMAL LOW (ref 3.5–5.0)
BUN: 12.9 mg/dL (ref 7.0–26.0)
CO2: 23 mEq/L (ref 22–29)
CREATININE: 0.7 mg/dL (ref 0.6–1.1)
Calcium: 9 mg/dL (ref 8.4–10.4)
Chloride: 107 mEq/L (ref 98–109)
Glucose: 132 mg/dl (ref 70–140)
Potassium: 3.8 mEq/L (ref 3.5–5.1)
Sodium: 138 mEq/L (ref 136–145)
Total Bilirubin: 0.65 mg/dL (ref 0.20–1.20)
Total Protein: 6.3 g/dL — ABNORMAL LOW (ref 6.4–8.3)

## 2013-08-08 LAB — URIC ACID (CC13): URIC ACID, SERUM: 2.8 mg/dL (ref 2.6–7.4)

## 2013-08-08 LAB — LACTATE DEHYDROGENASE (CC13): LDH: 206 U/L (ref 125–245)

## 2013-08-08 MED ORDER — ONDANSETRON 8 MG/50ML IVPB (CHCC)
8.0000 mg | Freq: Once | INTRAVENOUS | Status: AC
  Administered 2013-08-08: 8 mg via INTRAVENOUS

## 2013-08-08 MED ORDER — DEXAMETHASONE SODIUM PHOSPHATE 10 MG/ML IJ SOLN
INTRAMUSCULAR | Status: AC
  Filled 2013-08-08: qty 1

## 2013-08-08 MED ORDER — DIPHENHYDRAMINE HCL 25 MG PO CAPS
50.0000 mg | ORAL_CAPSULE | Freq: Once | ORAL | Status: AC
  Administered 2013-08-08: 50 mg via ORAL

## 2013-08-08 MED ORDER — SODIUM CHLORIDE 0.9 % IV SOLN
90.0000 mg/m2 | Freq: Once | INTRAVENOUS | Status: AC
  Administered 2013-08-08: 198 mg via INTRAVENOUS
  Filled 2013-08-08: qty 2.2

## 2013-08-08 MED ORDER — SODIUM CHLORIDE 0.9 % IV SOLN
375.0000 mg/m2 | Freq: Once | INTRAVENOUS | Status: AC
  Administered 2013-08-08: 800 mg via INTRAVENOUS
  Filled 2013-08-08: qty 80

## 2013-08-08 MED ORDER — DEXAMETHASONE SODIUM PHOSPHATE 10 MG/ML IJ SOLN
10.0000 mg | Freq: Once | INTRAMUSCULAR | Status: AC
  Administered 2013-08-08: 10 mg via INTRAVENOUS

## 2013-08-08 MED ORDER — SODIUM CHLORIDE 0.9 % IV SOLN
Freq: Once | INTRAVENOUS | Status: AC
  Administered 2013-08-08: 09:00:00 via INTRAVENOUS

## 2013-08-08 MED ORDER — ONDANSETRON 8 MG/NS 50 ML IVPB
INTRAVENOUS | Status: AC
  Filled 2013-08-08: qty 8

## 2013-08-08 MED ORDER — ACETAMINOPHEN 325 MG PO TABS
650.0000 mg | ORAL_TABLET | Freq: Once | ORAL | Status: AC
  Administered 2013-08-08: 650 mg via ORAL

## 2013-08-08 MED ORDER — ACETAMINOPHEN 325 MG PO TABS
ORAL_TABLET | ORAL | Status: AC
  Filled 2013-08-08: qty 2

## 2013-08-08 MED ORDER — SODIUM CHLORIDE 0.9 % IJ SOLN
10.0000 mL | INTRAMUSCULAR | Status: DC | PRN
  Administered 2013-08-08: 10 mL via INTRAVENOUS
  Filled 2013-08-08: qty 10

## 2013-08-08 MED ORDER — HEPARIN SOD (PORK) LOCK FLUSH 100 UNIT/ML IV SOLN
500.0000 [IU] | Freq: Once | INTRAVENOUS | Status: AC | PRN
  Administered 2013-08-08: 500 [IU]
  Filled 2013-08-08: qty 5

## 2013-08-08 MED ORDER — DIPHENHYDRAMINE HCL 25 MG PO CAPS
ORAL_CAPSULE | ORAL | Status: AC
  Filled 2013-08-08: qty 2

## 2013-08-08 MED ORDER — SODIUM CHLORIDE 0.9 % IJ SOLN
10.0000 mL | INTRAMUSCULAR | Status: DC | PRN
  Administered 2013-08-08: 10 mL
  Filled 2013-08-08: qty 10

## 2013-08-08 NOTE — Telephone Encounter (Signed)
gv and printed appt sched and avs for pt for June and July....sed added tx. °

## 2013-08-08 NOTE — Progress Notes (Signed)
14:00: Pt escorted to lobby by RN via wheelchair, with pt's visitor.

## 2013-08-08 NOTE — Assessment & Plan Note (Signed)
Overall, she tolerated treatment well without side effects. I explained to the patient the rationale of every 4 weeks treatment observe every 3 weeks, due to elevated liver function test with previous treatment. I will proceed to order repeat PET CT scan next month prior to cycle 4. I explained to her in general, most patients will need between 4-6 cycles of treatment. If repeat PET scan show complete response to treatment, I would defer only one additional treatment meaning total of 4 cycles of treatment. If repeat PET CT scan show residual disease, I would proceed for total of 6 cycles of treatment and repeat PET/CT scan again.

## 2013-08-08 NOTE — Patient Instructions (Signed)

## 2013-08-08 NOTE — Assessment & Plan Note (Signed)
I will monitor this very closely. The elevated alkaline phosphatase is likely related to increased bone turnover. The liver function tests were within normal limits today and I will proceed with treatment without dosage adjustment.

## 2013-08-08 NOTE — Progress Notes (Signed)
[facility] OFFICE PROGRESS NOTE  Patient Care Team: Tivis Ringer, MD as PCP - General (Internal Medicine)  SUMMARY OF ONCOLOGIC HISTORY: Oncology History   Lymphoma, diffuse large B cell Bone marrow aspirate and biopsy was not performed due to scheduling issues and the need to start treatment ASAP   Primary site: Lymphoid Neoplasms   Staging method: AJCC 6th Edition   Clinical: Stage III signed by Heath Lark, MD on 05/30/2013  8:37 PM   Summary: Stage III       Diffuse large B cell lymphoma   05/02/2013 Procedure She has fine-needle aspirate and biopsy of left cervical lymph node, suspicious for high-grade lymphoma   05/10/2013 Surgery Excisional lymph node biopsy confirmed diffuse large B-cell lymphoma   06/04/2013 Procedure The patient have placement of Infuse-a-Port   06/05/2013 Imaging PET scan showed disease both above and below the diaphragm, consistent with stage III   06/12/2013 -  Chemotherapy The patient will start on cycle 1 of bendamustine with rituximab.    INTERVAL HISTORY: Please see below for problem oriented charting. She is seen today prior to cycle 3 of therapy.  REVIEW OF SYSTEMS:   Constitutional: Denies fevers, chills or abnormal weight loss Eyes: Denies blurriness of vision Ears, nose, mouth, throat, and face: Denies mucositis or sore throat Respiratory: Denies cough, dyspnea or wheezes Cardiovascular: Denies palpitation, chest discomfort or lower extremity swelling Gastrointestinal:  Denies nausea, heartburn or change in bowel habits Skin: Denies abnormal skin rashes Lymphatics: Denies new lymphadenopathy or easy bruising Neurological:Denies numbness, tingling or new weaknesses Behavioral/Psych: Mood is stable, no new changes  All other systems were reviewed with the patient and are negative.  I have reviewed the past medical history, past surgical history, social history and family history with the patient and they are unchanged from  previous note.  ALLERGIES:  has No Known Allergies.  MEDICATIONS:  Current Outpatient Prescriptions  Medication Sig Dispense Refill  . acyclovir (ZOVIRAX) 400 MG tablet Take 1 tablet (400 mg total) by mouth daily.  30 tablet  3  . allopurinol (ZYLOPRIM) 300 MG tablet Take 1 tablet (300 mg total) by mouth daily.  30 tablet  3  . amitriptyline (ELAVIL) 100 MG tablet Take 100 mg by mouth at bedtime.      Marland Kitchen aspirin 81 MG EC tablet Take 81 mg by mouth every morning.       Marland Kitchen atorvastatin (LIPITOR) 40 MG tablet Take 40 mg by mouth every morning.       . cholecalciferol (VITAMIN D) 1000 UNITS tablet Take 1,000 Units by mouth every morning.       . Cinnamon 500 MG TABS Take 1,000 mg by mouth every morning.       . lidocaine-prilocaine (EMLA) cream Apply 1 application topically as needed.  30 g  0  . LORazepam (ATIVAN) 2 MG tablet Take 2 mg by mouth 2 (two) times daily.       Marland Kitchen MAGNESIUM OXIDE PO Take 2 tablets by mouth every morning.       . multivitamin-iron-minerals-folic acid (CENTRUM) chewable tablet Chew 1 tablet by mouth daily.      . nitroGLYCERIN (NITROSTAT) 0.4 MG SL tablet Place 1 tablet (0.4 mg total) under the tongue every 5 (five) minutes as needed for chest pain.  25 tablet  1  . Omega-3 Fatty Acids (FISH OIL) 1000 MG CAPS Take 1,000 mg by mouth every morning.       . polyethylene glycol (MIRALAX /  GLYCOLAX) packet Take 17 g by mouth 2 (two) times daily.      . quinapril (ACCUPRIL) 20 MG tablet Take 20 mg by mouth every morning.       . Calcium Carbonate-Vit D-Min (CALTRATE 600+D PLUS) 600-400 MG-UNIT per tablet Take 1 tablet by mouth every morning.       . Coenzyme Q10 (CO Q 10 PO) Take 1 capsule by mouth every morning.       Marland Kitchen ibuprofen (ADVIL,MOTRIN) 200 MG tablet Take 400 mg by mouth daily as needed for moderate pain.      . influenza vac split trivalent high-dose (FLUZONE) injection Inject 0.5 mLs into the muscle once.      . ondansetron (ZOFRAN) 8 MG tablet Take 1 tablet (8 mg  total) by mouth 2 (two) times daily. Start the day after chemo for 2 days. Then take as needed for nausea or vomiting.  30 tablet  1  . prochlorperazine (COMPAZINE) 10 MG tablet Take 1 tablet (10 mg total) by mouth every 6 (six) hours as needed (Nausea or vomiting).  30 tablet  1   No current facility-administered medications for this visit.   Facility-Administered Medications Ordered in Other Visits  Medication Dose Route Frequency Alantis [place] Last Rate Last Dose  . sodium chloride 0.9 % injection 10 mL  10 mL Intravenous PRN Heath Lark, MD   10 mL at 08/08/13 0815    PHYSICAL EXAMINATION: ECOG PERFORMANCE STATUS: 2 - Symptomatic, <50% confined to bed  Filed Vitals:   08/08/13 0839  BP: 115/57  Pulse: 99  Temp: 98.5 F (36.9 C)  Resp: 16   Filed Weights   08/08/13 0839  Weight: 223 lb 4.8 oz (101.288 kg)    GENERAL:alert, no distress and comfortable. She is morbidly obese and sitting on the wheelchair SKIN: skin color, texture, turgor are normal, no rashes or significant lesions EYES: normal, Conjunctiva are pink and non-injected, sclera clear OROPHARYNX:no exudate, no erythema and lips, buccal mucosa, and tongue normal  NECK: supple, thyroid normal size, non-tender, without nodularity LYMPH:  no palpable lymphadenopathy in the cervical, axillary or inguinal LUNGS: clear to auscultation and percussion with normal breathing effort HEART: regular rate & rhythm and no murmurs and no lower extremity edema ABDOMEN:abdomen soft, non-tender and normal bowel sounds Musculoskeletal:no cyanosis of digits and no clubbing  NEURO: alert & oriented x 3 with fluent speech, no focal motor/sensory deficits  LABORATORY DATA:  I have reviewed the data as listed    Component Value Date/Time   NA 138 08/08/2013 0806   NA 138 06/06/2013 0830   K 3.8 08/08/2013 0806   K 4.2 06/06/2013 0830   CL 103 06/06/2013 0830   CL 107 04/05/2012 1158   CO2 23 08/08/2013 0806   CO2 23 06/06/2013 0830   GLUCOSE  132 08/08/2013 0806   GLUCOSE 128* 06/06/2013 0830   GLUCOSE 144* 04/05/2012 1158   BUN 12.9 08/08/2013 0806   BUN 15 06/06/2013 0830   CREATININE 0.7 08/08/2013 0806   CREATININE 0.69 06/06/2013 0830   CALCIUM 9.0 08/08/2013 0806   CALCIUM 9.1 06/06/2013 0830   PROT 6.3* 08/08/2013 0806   PROT 6.7 06/06/2013 0830   ALBUMIN 3.3* 08/08/2013 0806   ALBUMIN 3.4* 06/06/2013 0830   AST 31 08/08/2013 0806   AST 18 06/06/2013 0830   ALT 49 08/08/2013 0806   ALT 23 06/06/2013 0830   ALKPHOS 249* 08/08/2013 0806   ALKPHOS 231* 06/06/2013 0830   BILITOT 0.65 08/08/2013 0569  BILITOT 0.4 06/06/2013 0830   GFRNONAA 82* 06/06/2013 0830   GFRAA >90 06/06/2013 0830    No results found for this basename: SPEP, UPEP,  kappa and lambda light chains    Lab Results  Component Value Date   WBC 7.6 08/08/2013   NEUTROABS 4.5 08/08/2013   HGB 12.7 08/08/2013   HCT 38.4 08/08/2013   MCV 86.1 08/08/2013   PLT 253 08/08/2013      Chemistry      Component Value Date/Time   NA 138 08/08/2013 0806   NA 138 06/06/2013 0830   K 3.8 08/08/2013 0806   K 4.2 06/06/2013 0830   CL 103 06/06/2013 0830   CL 107 04/05/2012 1158   CO2 23 08/08/2013 0806   CO2 23 06/06/2013 0830   BUN 12.9 08/08/2013 0806   BUN 15 06/06/2013 0830   CREATININE 0.7 08/08/2013 0806   CREATININE 0.69 06/06/2013 0830      Component Value Date/Time   CALCIUM 9.0 08/08/2013 0806   CALCIUM 9.1 06/06/2013 0830   ALKPHOS 249* 08/08/2013 0806   ALKPHOS 231* 06/06/2013 0830   AST 31 08/08/2013 0806   AST 18 06/06/2013 0830   ALT 49 08/08/2013 0806   ALT 23 06/06/2013 0830   BILITOT 0.65 08/08/2013 0806   BILITOT 0.4 06/06/2013 0830      ASSESSMENT & PLAN:  Diffuse large B cell lymphoma Overall, she tolerated treatment well without side effects. I explained to the patient the rationale of every 4 weeks treatment observe every 3 weeks, due to elevated liver function test with previous treatment. I will proceed to order repeat PET CT scan next month prior to cycle  4. I explained to her in general, most patients will need between 4-6 cycles of treatment. If repeat PET scan show complete response to treatment, I would defer only one additional treatment meaning total of 4 cycles of treatment. If repeat PET CT scan show residual disease, I would proceed for total of 6 cycles of treatment and repeat PET/CT scan again.  Elevated liver function tests I will monitor this very closely. The elevated alkaline phosphatase is likely related to increased bone turnover. The liver function tests were within normal limits today and I will proceed with treatment without dosage adjustment.   Orders Placed This Encounter  Procedures  . NM PET Image Restag (PS) Skull Base To Thigh    Standing Status: Future     Number of Occurrences:      Standing Expiration Date: 10/08/2014    Order Specific Question:  Reason for Exam (SYMPTOM  OR DIAGNOSIS REQUIRED)    Answer:  lymphoma, assess response to Rx    Order Specific Question:  Preferred imaging location?    Answer:  [facility]   All questions were answered. The patient knows to call the clinic with any problems, questions or concerns. No barriers to learning was detected.    [place], [place], MD 08/08/2013 9:02 AM

## 2013-08-08 NOTE — Patient Instructions (Addendum)
[facility] Discharge Instructions for Patients Receiving Chemotherapy  Today you received the following chemotherapy agents Rituxan, Treanda. To help prevent nausea and vomiting after your treatment, we encourage you to take your nausea medication, zofran or compazine.   If you develop nausea and vomiting that is not controlled by your nausea medication, call the clinic.   BELOW ARE SYMPTOMS THAT SHOULD BE REPORTED IMMEDIATELY:  *FEVER GREATER THAN 100.5 F  *CHILLS WITH OR WITHOUT FEVER  NAUSEA AND VOMITING THAT IS NOT CONTROLLED WITH YOUR NAUSEA MEDICATION  *UNUSUAL SHORTNESS OF BREATH  *UNUSUAL BRUISING OR BLEEDING  TENDERNESS IN MOUTH AND THROAT WITH OR WITHOUT PRESENCE OF ULCERS  *URINARY PROBLEMS  *BOWEL PROBLEMS  UNUSUAL RASH Items with * indicate a potential emergency and should be followed up as soon as possible.  Feel free to call the clinic should you have any questions or concerns. The clinic phone number is [phone].

## 2013-08-09 ENCOUNTER — Ambulatory Visit (HOSPITAL_BASED_OUTPATIENT_CLINIC_OR_DEPARTMENT_OTHER): Payer: Medicare HMO

## 2013-08-09 VITALS — BP 119/61 | HR 105 | Temp 98.6°F | Resp 20

## 2013-08-09 DIAGNOSIS — Z5111 Encounter for antineoplastic chemotherapy: Secondary | ICD-10-CM

## 2013-08-09 DIAGNOSIS — C8330 Diffuse large B-cell lymphoma, unspecified site: Secondary | ICD-10-CM

## 2013-08-09 DIAGNOSIS — C8581 Other specified types of non-Hodgkin lymphoma, lymph nodes of head, face, and neck: Secondary | ICD-10-CM

## 2013-08-09 MED ORDER — SODIUM CHLORIDE 0.9 % IV SOLN
90.0000 mg/m2 | Freq: Once | INTRAVENOUS | Status: AC
  Administered 2013-08-09: 198 mg via INTRAVENOUS
  Filled 2013-08-09: qty 2.2

## 2013-08-09 MED ORDER — ONDANSETRON 8 MG/NS 50 ML IVPB
INTRAVENOUS | Status: AC
  Filled 2013-08-09: qty 8

## 2013-08-09 MED ORDER — SODIUM CHLORIDE 0.9 % IV SOLN
Freq: Once | INTRAVENOUS | Status: AC
  Administered 2013-08-09: 10:00:00 via INTRAVENOUS

## 2013-08-09 MED ORDER — DEXAMETHASONE SODIUM PHOSPHATE 10 MG/ML IJ SOLN
10.0000 mg | Freq: Once | INTRAMUSCULAR | Status: AC
  Administered 2013-08-09: 10 mg via INTRAVENOUS

## 2013-08-09 MED ORDER — SODIUM CHLORIDE 0.9 % IJ SOLN
10.0000 mL | INTRAMUSCULAR | Status: DC | PRN
  Administered 2013-08-09: 10 mL
  Filled 2013-08-09: qty 10

## 2013-08-09 MED ORDER — ONDANSETRON 8 MG/50ML IVPB (CHCC)
8.0000 mg | Freq: Once | INTRAVENOUS | Status: AC
  Administered 2013-08-09: 8 mg via INTRAVENOUS

## 2013-08-09 MED ORDER — HEPARIN SOD (PORK) LOCK FLUSH 100 UNIT/ML IV SOLN
500.0000 [IU] | Freq: Once | INTRAVENOUS | Status: AC | PRN
  Administered 2013-08-09: 500 [IU]
  Filled 2013-08-09: qty 5

## 2013-08-09 MED ORDER — DEXAMETHASONE SODIUM PHOSPHATE 10 MG/ML IJ SOLN
INTRAMUSCULAR | Status: AC
  Filled 2013-08-09: qty 1

## 2013-08-09 NOTE — Patient Instructions (Signed)
[facility] Discharge Instructions for Patients Receiving Chemotherapy  Today you received the following chemotherapy agents Treanda.  To help prevent nausea and vomiting after your treatment, we encourage you to take your nausea medication.   If you develop nausea and vomiting that is not controlled by your nausea medication, call the clinic.   BELOW ARE SYMPTOMS THAT SHOULD BE REPORTED IMMEDIATELY:  *FEVER GREATER THAN 100.5 F  *CHILLS WITH OR WITHOUT FEVER  NAUSEA AND VOMITING THAT IS NOT CONTROLLED WITH YOUR NAUSEA MEDICATION  *UNUSUAL SHORTNESS OF BREATH  *UNUSUAL BRUISING OR BLEEDING  TENDERNESS IN MOUTH AND THROAT WITH OR WITHOUT PRESENCE OF ULCERS  *URINARY PROBLEMS  *BOWEL PROBLEMS  UNUSUAL RASH Items with * indicate a potential emergency and should be followed up as soon as possible.  Feel free to call the clinic you have any questions or concerns. The clinic phone number is [phone].    

## 2013-08-10 ENCOUNTER — Ambulatory Visit (HOSPITAL_BASED_OUTPATIENT_CLINIC_OR_DEPARTMENT_OTHER): Payer: Medicare HMO

## 2013-08-10 VITALS — BP 92/50 | HR 97 | Temp 98.2°F

## 2013-08-10 DIAGNOSIS — Z5189 Encounter for other specified aftercare: Secondary | ICD-10-CM

## 2013-08-10 DIAGNOSIS — C8581 Other specified types of non-Hodgkin lymphoma, lymph nodes of head, face, and neck: Secondary | ICD-10-CM

## 2013-08-10 DIAGNOSIS — C8591 Non-Hodgkin lymphoma, unspecified, lymph nodes of head, face, and neck: Secondary | ICD-10-CM

## 2013-08-10 MED ORDER — PEGFILGRASTIM INJECTION 6 MG/0.6ML
6.0000 mg | Freq: Once | SUBCUTANEOUS | Status: AC
  Administered 2013-08-10: 6 mg via SUBCUTANEOUS
  Filled 2013-08-10: qty 0.6

## 2013-08-13 ENCOUNTER — Telehealth: Payer: Self-pay | Admitting: Nurse Practitioner

## 2013-08-13 NOTE — Telephone Encounter (Signed)
This RN called patient to f/u on weekend call-a-nurse report. Patient was experiencing constipation and taking prn meds sparingly. Triage RN gave instructions per Dr. Alvy Bimler which did result in patient having BM on satruday 08/11/13. Patient denies further needs at this time and is encouraged to call our clinic should she have any concerns. Pt verbalizes understanding and thanks for the call.

## 2013-08-14 ENCOUNTER — Ambulatory Visit (INDEPENDENT_AMBULATORY_CARE_PROVIDER_SITE_OTHER): Payer: Medicare HMO | Admitting: General Surgery

## 2013-08-16 ENCOUNTER — Telehealth: Payer: Self-pay | Admitting: *Deleted

## 2013-08-16 NOTE — Telephone Encounter (Signed)
Informed pt she can stop the allopurinol but needs to keep taking the Acyclovir.  She verbalized understanding.

## 2013-08-16 NOTE — Telephone Encounter (Signed)
Pt left VM asking if she is supposed to continue taking her "chemo meds" at home?  Per Dr. Alvy Bimler, pt does need to continue the acyclovir but may stop the allopurinol.  Attempted to call pt back twice,  No answer.

## 2013-09-03 ENCOUNTER — Ambulatory Visit (HOSPITAL_COMMUNITY)
Admission: RE | Admit: 2013-09-03 | Discharge: 2013-09-03 | Disposition: A | Discharge status: Home / Self Care | Payer: Medicare HMO | Source: Ambulatory Visit | Attending: Hematology and Oncology | Admitting: Hematology and Oncology

## 2013-09-03 ENCOUNTER — Encounter (HOSPITAL_COMMUNITY): Payer: Self-pay

## 2013-09-03 DIAGNOSIS — C8330 Diffuse large B-cell lymphoma, unspecified site: Secondary | ICD-10-CM

## 2013-09-03 DIAGNOSIS — C8589 Other specified types of non-Hodgkin lymphoma, extranodal and solid organ sites: Secondary | ICD-10-CM | POA: Insufficient documentation

## 2013-09-03 LAB — GLUCOSE, CAPILLARY: Glucose-Capillary: 109 mg/dL — ABNORMAL HIGH (ref 70–99)

## 2013-09-03 MED ORDER — FLUDEOXYGLUCOSE F - 18 (FDG) INJECTION: 11.9000 | Freq: Once | INTRAVENOUS | Status: AC | PRN

## 2013-09-05 ENCOUNTER — Ambulatory Visit (HOSPITAL_BASED_OUTPATIENT_CLINIC_OR_DEPARTMENT_OTHER): Payer: Medicare HMO

## 2013-09-05 ENCOUNTER — Other Ambulatory Visit (HOSPITAL_BASED_OUTPATIENT_CLINIC_OR_DEPARTMENT_OTHER): Payer: Medicare HMO

## 2013-09-05 ENCOUNTER — Ambulatory Visit (HOSPITAL_BASED_OUTPATIENT_CLINIC_OR_DEPARTMENT_OTHER): Payer: Medicare HMO | Admitting: Hematology and Oncology

## 2013-09-05 VITALS — BP 122/59 | HR 90 | Temp 98.4°F | Resp 18

## 2013-09-05 VITALS — BP 126/60 | HR 94 | Temp 98.3°F | Resp 19 | Ht 65.0 in | Wt 215.8 lb

## 2013-09-05 DIAGNOSIS — Z5112 Encounter for antineoplastic immunotherapy: Secondary | ICD-10-CM

## 2013-09-05 DIAGNOSIS — C8591 Non-Hodgkin lymphoma, unspecified, lymph nodes of head, face, and neck: Secondary | ICD-10-CM

## 2013-09-05 DIAGNOSIS — R945 Abnormal results of liver function studies: Secondary | ICD-10-CM

## 2013-09-05 DIAGNOSIS — Z95828 Presence of other vascular implants and grafts: Secondary | ICD-10-CM | POA: Insufficient documentation

## 2013-09-05 DIAGNOSIS — C8581 Other specified types of non-Hodgkin lymphoma, lymph nodes of head, face, and neck: Secondary | ICD-10-CM

## 2013-09-05 DIAGNOSIS — Z5111 Encounter for antineoplastic chemotherapy: Secondary | ICD-10-CM

## 2013-09-05 DIAGNOSIS — C8330 Diffuse large B-cell lymphoma, unspecified site: Secondary | ICD-10-CM

## 2013-09-05 DIAGNOSIS — R7989 Other specified abnormal findings of blood chemistry: Secondary | ICD-10-CM

## 2013-09-05 LAB — CBC WITH DIFFERENTIAL/PLATELET
BASO%: 1 % (ref 0.0–2.0)
Basophils Absolute: 0.1 10*3/uL (ref 0.0–0.1)
EOS ABS: 0.4 10*3/uL (ref 0.0–0.5)
EOS%: 8.3 % — ABNORMAL HIGH (ref 0.0–7.0)
HEMATOCRIT: 36.7 % (ref 34.8–46.6)
HGB: 12.2 g/dL (ref 11.6–15.9)
LYMPH#: 0.3 10*3/uL — AB (ref 0.9–3.3)
LYMPH%: 6.2 % — AB (ref 14.0–49.7)
MCH: 29.2 pg (ref 25.1–34.0)
MCHC: 33.3 g/dL (ref 31.5–36.0)
MCV: 87.7 fL (ref 79.5–101.0)
MONO#: 0.8 10*3/uL (ref 0.1–0.9)
MONO%: 16.5 % — ABNORMAL HIGH (ref 0.0–14.0)
NEUT%: 68 % (ref 38.4–76.8)
NEUTROS ABS: 3.4 10*3/uL (ref 1.5–6.5)
PLATELETS: 203 10*3/uL (ref 145–400)
RBC: 4.18 10*6/uL (ref 3.70–5.45)
RDW: 20.1 % — ABNORMAL HIGH (ref 11.2–14.5)
WBC: 5 10*3/uL (ref 3.9–10.3)

## 2013-09-05 LAB — COMPREHENSIVE METABOLIC PANEL (CC13)
ALBUMIN: 3.2 g/dL — AB (ref 3.5–5.0)
ALT: 42 U/L (ref 0–55)
ANION GAP: 8 meq/L (ref 3–11)
AST: 29 U/L (ref 5–34)
Alkaline Phosphatase: 234 U/L — ABNORMAL HIGH (ref 40–150)
BILIRUBIN TOTAL: 0.63 mg/dL (ref 0.20–1.20)
BUN: 9.7 mg/dL (ref 7.0–26.0)
CHLORIDE: 107 meq/L (ref 98–109)
CO2: 24 meq/L (ref 22–29)
Calcium: 9.2 mg/dL (ref 8.4–10.4)
Creatinine: 0.7 mg/dL (ref 0.6–1.1)
GLUCOSE: 108 mg/dL (ref 70–140)
Potassium: 4.1 mEq/L (ref 3.5–5.1)
SODIUM: 139 meq/L (ref 136–145)
Total Protein: 6.1 g/dL — ABNORMAL LOW (ref 6.4–8.3)

## 2013-09-05 MED ORDER — ONDANSETRON 8 MG/50ML IVPB (CHCC)
8.0000 mg | Freq: Once | INTRAVENOUS | Status: AC
  Administered 2013-09-05: 8 mg via INTRAVENOUS

## 2013-09-05 MED ORDER — DIPHENHYDRAMINE HCL 25 MG PO CAPS
ORAL_CAPSULE | ORAL | Status: AC
  Filled 2013-09-05: qty 2

## 2013-09-05 MED ORDER — SODIUM CHLORIDE 0.9 % IV SOLN
375.0000 mg/m2 | Freq: Once | INTRAVENOUS | Status: AC
  Administered 2013-09-05: 800 mg via INTRAVENOUS
  Filled 2013-09-05: qty 80

## 2013-09-05 MED ORDER — ONDANSETRON 8 MG/NS 50 ML IVPB
INTRAVENOUS | Status: AC
  Filled 2013-09-05: qty 8

## 2013-09-05 MED ORDER — DIPHENHYDRAMINE HCL 25 MG PO CAPS
50.0000 mg | ORAL_CAPSULE | Freq: Once | ORAL | Status: AC
  Administered 2013-09-05: 50 mg via ORAL

## 2013-09-05 MED ORDER — SODIUM CHLORIDE 0.9 % IV SOLN
Freq: Once | INTRAVENOUS | Status: AC
  Administered 2013-09-05: 10:00:00 via INTRAVENOUS

## 2013-09-05 MED ORDER — SODIUM CHLORIDE 0.9 % IV SOLN
90.0000 mg/m2 | Freq: Once | INTRAVENOUS | Status: AC
  Administered 2013-09-05: 198 mg via INTRAVENOUS
  Filled 2013-09-05: qty 2.2

## 2013-09-05 MED ORDER — ACETAMINOPHEN 325 MG PO TABS
ORAL_TABLET | ORAL | Status: AC
  Filled 2013-09-05: qty 2

## 2013-09-05 MED ORDER — DEXAMETHASONE SODIUM PHOSPHATE 10 MG/ML IJ SOLN
10.0000 mg | Freq: Once | INTRAMUSCULAR | Status: AC
  Administered 2013-09-05: 10 mg via INTRAVENOUS

## 2013-09-05 MED ORDER — ACETAMINOPHEN 325 MG PO TABS
650.0000 mg | ORAL_TABLET | Freq: Once | ORAL | Status: AC
  Administered 2013-09-05: 650 mg via ORAL

## 2013-09-05 MED ORDER — ACYCLOVIR 400 MG PO TABS: 400.0000 mg | ORAL_TABLET | Freq: Every day | ORAL | Status: DC

## 2013-09-05 MED ORDER — HEPARIN SOD (PORK) LOCK FLUSH 100 UNIT/ML IV SOLN
500.0000 [IU] | Freq: Once | INTRAVENOUS | Status: AC | PRN
  Administered 2013-09-05: 500 [IU]
  Filled 2013-09-05: qty 5

## 2013-09-05 MED ORDER — DEXAMETHASONE SODIUM PHOSPHATE 10 MG/ML IJ SOLN
INTRAMUSCULAR | Status: AC
  Filled 2013-09-05: qty 1

## 2013-09-05 MED ORDER — SODIUM CHLORIDE 0.9 % IJ SOLN
10.0000 mL | INTRAMUSCULAR | Status: DC | PRN
  Administered 2013-09-05 (×2): 10 mL via INTRAVENOUS
  Filled 2013-09-05: qty 10

## 2013-09-05 MED ORDER — SODIUM CHLORIDE 0.9 % IJ SOLN
10.0000 mL | INTRAMUSCULAR | Status: DC | PRN
  Filled 2013-09-05: qty 10

## 2013-09-05 NOTE — Assessment & Plan Note (Signed)
I suspect this could be due to fatty liver disease or increased bone turnover. I will proceed with treatment without dosage adjustment.

## 2013-09-05 NOTE — Progress Notes (Signed)
[facility] OFFICE PROGRESS NOTE  Patient Care Team: Tivis Ringer, MD as PCP - General (Internal Medicine)  SUMMARY OF ONCOLOGIC HISTORY: Oncology History   Lymphoma, diffuse large B cell Bone marrow aspirate and biopsy was not performed due to scheduling issues and the need to start treatment ASAP   Primary site: Lymphoid Neoplasms   Staging method: AJCC 6th Edition   Clinical: Stage III signed by Heath Lark, MD on 05/30/2013  8:37 PM   Summary: Stage III       Diffuse large B cell lymphoma   05/02/2013 Procedure She has fine-needle aspirate and biopsy of left cervical lymph node, suspicious for high-grade lymphoma   05/10/2013 Surgery Excisional lymph node biopsy confirmed diffuse large B-cell lymphoma   06/04/2013 Procedure The patient have placement of Infuse-a-Port   06/05/2013 Imaging PET scan showed disease both above and below the diaphragm, consistent with stage III   06/12/2013 -  Chemotherapy The patient will start on cycle 1 of bendamustine with rituximab.   09/03/2013 Imaging Repeat PET CT scan show complete response to treatment.    INTERVAL HISTORY: Please see below for problem oriented charting. She is seen prior to cycle 4 of treatment. She denies side effects from prior treatment.  REVIEW OF SYSTEMS:   Constitutional: Denies fevers, chills or abnormal weight loss Eyes: Denies blurriness of vision Ears, nose, mouth, throat, and face: Denies mucositis or sore throat Respiratory: Denies cough, dyspnea or wheezes Cardiovascular: Denies palpitation, chest discomfort or lower extremity swelling Gastrointestinal:  Denies nausea, heartburn or change in bowel habits Skin: Denies abnormal skin rashes Lymphatics: Denies new lymphadenopathy or easy bruising Neurological:Denies numbness, tingling or new weaknesses Behavioral/Psych: Mood is stable, no new changes  All other systems were reviewed with the patient and are negative.  I have reviewed the past  medical history, past surgical history, social history and family history with the patient and they are unchanged from previous note.  ALLERGIES:  has No Known Allergies.  MEDICATIONS:  Current Outpatient Prescriptions  Medication Sig Dispense Refill  . acyclovir (ZOVIRAX) 400 MG tablet Take 1 tablet (400 mg total) by mouth daily.  30 tablet  3  . amitriptyline (ELAVIL) 100 MG tablet Take 100 mg by mouth at bedtime.      Marland Kitchen aspirin 81 MG EC tablet Take 81 mg by mouth every morning.       Marland Kitchen atorvastatin (LIPITOR) 40 MG tablet Take 40 mg by mouth every morning.       . Calcium Carbonate-Vit D-Min (CALTRATE 600+D PLUS) 600-400 MG-UNIT per tablet Take 1 tablet by mouth every morning.       . cholecalciferol (VITAMIN D) 1000 UNITS tablet Take 1,000 Units by mouth every morning.       . Cinnamon 500 MG TABS Take 1,000 mg by mouth every morning.       . Coenzyme Q10 (CO Q 10 PO) Take 1 capsule by mouth every morning.       Marland Kitchen ibuprofen (ADVIL,MOTRIN) 200 MG tablet Take 400 mg by mouth daily as needed for moderate pain.      . influenza vac split trivalent high-dose (FLUZONE) injection Inject 0.5 mLs into the muscle once.      . lidocaine-prilocaine (EMLA) cream Apply 1 application topically as needed.  30 g  0  . LORazepam (ATIVAN) 2 MG tablet Take 2 mg by mouth 2 (two) times daily.       Marland Kitchen MAGNESIUM OXIDE PO Take 2 tablets by  mouth every morning.       . multivitamin-iron-minerals-folic acid (CENTRUM) chewable tablet Chew 1 tablet by mouth daily.      . nitroGLYCERIN (NITROSTAT) 0.4 MG SL tablet Place 1 tablet (0.4 mg total) under the tongue every 5 (five) minutes as needed for chest pain.  25 tablet  1  . Omega-3 Fatty Acids (FISH OIL) 1000 MG CAPS Take 1,000 mg by mouth every morning.       . ondansetron (ZOFRAN) 8 MG tablet Take 1 tablet (8 mg total) by mouth 2 (two) times daily. Start the day after chemo for 2 days. Then take as needed for nausea or vomiting.  30 tablet  1  . polyethylene glycol  (MIRALAX / GLYCOLAX) packet Take 17 g by mouth 2 (two) times daily.      . prochlorperazine (COMPAZINE) 10 MG tablet Take 1 tablet (10 mg total) by mouth every 6 (six) hours as needed (Nausea or vomiting).  30 tablet  1  . quinapril (ACCUPRIL) 20 MG tablet Take 20 mg by mouth every morning.        Current Facility-Administered Medications  Medication Dose Route Frequency Provider Last Rate Last Dose  . sodium chloride 0.9 % injection 10 mL  10 mL Intravenous PRN Heath Lark, MD   10 mL at 09/05/13 0848   Facility-Administered Medications Ordered in Other Visits  Medication Dose Route Frequency Provider Last Rate Last Dose  . bendamustine (TREANDA) 198 mg in sodium chloride 0.9 % 500 mL chemo infusion  90 mg/m2 (Order-Specific) Intravenous Once Heath Lark, MD      . dexamethasone (DECADRON) injection 10 mg  10 mg Intravenous Once Heath Lark, MD      . heparin lock flush 100 unit/mL  500 Units Intracatheter Once PRN Heath Lark, MD      . ondansetron (ZOFRAN) IVPB 8 mg  8 mg Intravenous Once Heath Lark, MD      . sodium chloride 0.9 % injection 10 mL  10 mL Intracatheter PRN Heath Lark, MD        PHYSICAL EXAMINATION: ECOG PERFORMANCE STATUS: 0 - Asymptomatic  Filed Vitals:   09/05/13 0900  BP: 126/60  Pulse: 94  Temp: 98.3 F (36.8 C)  Resp: 19   Filed Weights   09/05/13 0927  Weight: 215 lb 12.8 oz (97.886 kg)    GENERAL:alert, no distress and comfortable. She is morbidly obese. Examination is limited due to her sitting on the wheelchair SKIN: skin color, texture, turgor are normal, no rashes or significant lesions EYES: normal, Conjunctiva are pink and non-injected, sclera clear OROPHARYNX:no exudate, no erythema and lips, buccal mucosa, and tongue normal  NECK: supple, thyroid normal size, non-tender, without nodularity LYMPH:  no palpable lymphadenopathy in the cervical, axillary or inguinal LUNGS: clear to auscultation and percussion with normal breathing effort HEART:  regular rate & rhythm and no murmurs and no lower extremity edema ABDOMEN:abdomen soft, non-tender and normal bowel sounds Musculoskeletal:no cyanosis of digits and no clubbing  NEURO: alert & oriented x 3 with fluent speech, no focal motor/sensory deficits  LABORATORY DATA:  I have reviewed the data as listed    Component Value Date/Time   NA 139 09/05/2013 0837   NA 138 06/06/2013 0830   K 4.1 09/05/2013 0837   K 4.2 06/06/2013 0830   CL 103 06/06/2013 0830   CL 107 04/05/2012 1158   CO2 24 09/05/2013 0837   CO2 23 06/06/2013 0830   GLUCOSE 108 09/05/2013 0837  GLUCOSE 128* 06/06/2013 0830   GLUCOSE 144* 04/05/2012 1158   BUN 9.7 09/05/2013 0837   BUN 15 06/06/2013 0830   CREATININE 0.7 09/05/2013 0837   CREATININE 0.69 06/06/2013 0830   CALCIUM 9.2 09/05/2013 0837   CALCIUM 9.1 06/06/2013 0830   PROT 6.1* 09/05/2013 0837   PROT 6.7 06/06/2013 0830   ALBUMIN 3.2* 09/05/2013 0837   ALBUMIN 3.4* 06/06/2013 0830   AST 29 09/05/2013 0837   AST 18 06/06/2013 0830   ALT 42 09/05/2013 0837   ALT 23 06/06/2013 0830   ALKPHOS 234* 09/05/2013 0837   ALKPHOS 231* 06/06/2013 0830   BILITOT 0.63 09/05/2013 0837   BILITOT 0.4 06/06/2013 0830   GFRNONAA 82* 06/06/2013 0830   GFRAA >90 06/06/2013 0830    No results found for this basename: SPEP, UPEP,  kappa and lambda light chains    Lab Results  Component Value Date   WBC 5.0 09/05/2013   NEUTROABS 3.4 09/05/2013   HGB 12.2 09/05/2013   HCT 36.7 09/05/2013   MCV 87.7 09/05/2013   PLT 203 09/05/2013      Chemistry      Component Value Date/Time   NA 139 09/05/2013 0837   NA 138 06/06/2013 0830   K 4.1 09/05/2013 0837   K 4.2 06/06/2013 0830   CL 103 06/06/2013 0830   CL 107 04/05/2012 1158   CO2 24 09/05/2013 0837   CO2 23 06/06/2013 0830   BUN 9.7 09/05/2013 0837   BUN 15 06/06/2013 0830   CREATININE 0.7 09/05/2013 0837   CREATININE 0.69 06/06/2013 0830      Component Value Date/Time   CALCIUM 9.2 09/05/2013 0837   CALCIUM 9.1 06/06/2013 0830   ALKPHOS  234* 09/05/2013 0837   ALKPHOS 231* 06/06/2013 0830   AST 29 09/05/2013 0837   AST 18 06/06/2013 0830   ALT 42 09/05/2013 0837   ALT 23 06/06/2013 0830   BILITOT 0.63 09/05/2013 0837   BILITOT 0.4 06/06/2013 0830       RADIOGRAPHIC STUDIES: Imaging studies show complete response to treatment. I have personally reviewed the radiological images as listed and agreed with the findings in the report.   ASSESSMENT & PLAN:  Diffuse large B cell lymphoma She tolerates treatment well without major side effects. Thankfully, imaging studies show complete response to treatment. Per guidelines, I recommend completing total of 6 cycles of treatment and she agreed to proceed.  Elevated liver function tests I suspect this could be due to fatty liver disease or increased bone turnover. I will proceed with treatment without dosage adjustment.   Orders Placed This Encounter  Procedures  . Schedule Portacath Flush Appointment    Schedule Portacath flush appointment   All questions were answered. The patient knows to call the clinic with any problems, questions or concerns. No barriers to learning was detected. I spent 25 minutes counseling the patient face to face. The total time spent in the appointment was 30 minutes and more than 50% was on counseling and review of test results     [facility], [place], MD 09/05/2013 10:40 AM

## 2013-09-05 NOTE — Patient Instructions (Signed)
[facility] Discharge Instructions for Patients Receiving Chemotherapy  Today you received the following chemotherapy agents Rituxan/Treanda.  To help prevent nausea and vomiting after your treatment, we encourage you to take your nausea medication as prescribed.   If you develop nausea and vomiting that is not controlled by your nausea medication, call the clinic.   BELOW ARE SYMPTOMS THAT SHOULD BE REPORTED IMMEDIATELY:  *FEVER GREATER THAN 100.5 F  *CHILLS WITH OR WITHOUT FEVER  NAUSEA AND VOMITING THAT IS NOT CONTROLLED WITH YOUR NAUSEA MEDICATION  *UNUSUAL SHORTNESS OF BREATH  *UNUSUAL BRUISING OR BLEEDING  TENDERNESS IN MOUTH AND THROAT WITH OR WITHOUT PRESENCE OF ULCERS  *URINARY PROBLEMS  *BOWEL PROBLEMS  UNUSUAL RASH Items with * indicate a potential emergency and should be followed up as soon as possible.  Feel free to call the clinic you have any questions or concerns. The clinic phone number is [phone].    

## 2013-09-05 NOTE — Assessment & Plan Note (Signed)
She tolerates treatment well without major side effects. Thankfully, imaging studies show complete response to treatment. Per guidelines, I recommend completing total of 6 cycles of treatment and she agreed to proceed.

## 2013-09-06 ENCOUNTER — Ambulatory Visit (HOSPITAL_BASED_OUTPATIENT_CLINIC_OR_DEPARTMENT_OTHER): Payer: Medicare HMO

## 2013-09-06 ENCOUNTER — Telehealth: Payer: Self-pay | Admitting: Hematology and Oncology

## 2013-09-06 VITALS — BP 118/58 | HR 102 | Temp 98.4°F | Resp 18

## 2013-09-06 DIAGNOSIS — C8581 Other specified types of non-Hodgkin lymphoma, lymph nodes of head, face, and neck: Secondary | ICD-10-CM

## 2013-09-06 DIAGNOSIS — C8330 Diffuse large B-cell lymphoma, unspecified site: Secondary | ICD-10-CM

## 2013-09-06 DIAGNOSIS — Z5111 Encounter for antineoplastic chemotherapy: Secondary | ICD-10-CM

## 2013-09-06 MED ORDER — ONDANSETRON 8 MG/50ML IVPB (CHCC)
8.0000 mg | Freq: Once | INTRAVENOUS | Status: AC
  Administered 2013-09-06: 8 mg via INTRAVENOUS

## 2013-09-06 MED ORDER — HEPARIN SOD (PORK) LOCK FLUSH 100 UNIT/ML IV SOLN
500.0000 [IU] | Freq: Once | INTRAVENOUS | Status: AC | PRN
  Administered 2013-09-06: 500 [IU]
  Filled 2013-09-06: qty 5

## 2013-09-06 MED ORDER — DEXAMETHASONE SODIUM PHOSPHATE 10 MG/ML IJ SOLN
10.0000 mg | Freq: Once | INTRAMUSCULAR | Status: AC
  Administered 2013-09-06: 10 mg via INTRAVENOUS

## 2013-09-06 MED ORDER — SODIUM CHLORIDE 0.9 % IV SOLN
90.0000 mg/m2 | Freq: Once | INTRAVENOUS | Status: AC
  Administered 2013-09-06: 198 mg via INTRAVENOUS
  Filled 2013-09-06: qty 2.2

## 2013-09-06 MED ORDER — ONDANSETRON 8 MG/NS 50 ML IVPB
INTRAVENOUS | Status: AC
  Filled 2013-09-06: qty 8

## 2013-09-06 MED ORDER — SODIUM CHLORIDE 0.9 % IJ SOLN
10.0000 mL | INTRAMUSCULAR | Status: DC | PRN
  Administered 2013-09-06: 10 mL
  Filled 2013-09-06: qty 10

## 2013-09-06 MED ORDER — DEXAMETHASONE SODIUM PHOSPHATE 10 MG/ML IJ SOLN
INTRAMUSCULAR | Status: AC
  Filled 2013-09-06: qty 1

## 2013-09-06 MED ORDER — SODIUM CHLORIDE 0.9 % IV SOLN
Freq: Once | INTRAVENOUS | Status: AC
  Administered 2013-09-06: 10:00:00 via INTRAVENOUS

## 2013-09-06 NOTE — Patient Instructions (Signed)
[facility] Discharge Instructions for Patients Receiving Chemotherapy  Today you received the following chemotherapy agent Treanda.  To help prevent nausea and vomiting after your treatment, we encourage you to take your nausea medication as instructed by your physician.    If you develop nausea and vomiting that is not controlled by your nausea medication, call the clinic.   BELOW ARE SYMPTOMS THAT SHOULD BE REPORTED IMMEDIATELY:  *FEVER GREATER THAN 100.5 F  *CHILLS WITH OR WITHOUT FEVER  NAUSEA AND VOMITING THAT IS NOT CONTROLLED WITH YOUR NAUSEA MEDICATION  *UNUSUAL SHORTNESS OF BREATH  *UNUSUAL BRUISING OR BLEEDING  TENDERNESS IN MOUTH AND THROAT WITH OR WITHOUT PRESENCE OF ULCERS  *URINARY PROBLEMS  *BOWEL PROBLEMS  UNUSUAL RASH Items with * indicate a potential emergency and should be followed up as soon as possible.  Feel free to call the clinic you have any questions or concerns. The clinic phone number is [phone].

## 2013-09-06 NOTE — Telephone Encounter (Signed)
gv and printed appts ched and avs for pt for July and Aug...sed added tx. °

## 2013-09-07 ENCOUNTER — Ambulatory Visit (HOSPITAL_BASED_OUTPATIENT_CLINIC_OR_DEPARTMENT_OTHER): Payer: Medicare HMO

## 2013-09-07 ENCOUNTER — Telehealth: Payer: Self-pay | Admitting: *Deleted

## 2013-09-07 VITALS — BP 96/35 | HR 101 | Temp 98.8°F

## 2013-09-07 DIAGNOSIS — C8591 Non-Hodgkin lymphoma, unspecified, lymph nodes of head, face, and neck: Secondary | ICD-10-CM

## 2013-09-07 DIAGNOSIS — C8581 Other specified types of non-Hodgkin lymphoma, lymph nodes of head, face, and neck: Secondary | ICD-10-CM

## 2013-09-07 DIAGNOSIS — Z5189 Encounter for other specified aftercare: Secondary | ICD-10-CM

## 2013-09-07 MED ORDER — PEGFILGRASTIM INJECTION 6 MG/0.6ML
6.0000 mg | Freq: Once | SUBCUTANEOUS | Status: AC
  Administered 2013-09-07: 6 mg via SUBCUTANEOUS
  Filled 2013-09-07: qty 0.6

## 2013-09-07 NOTE — Telephone Encounter (Signed)
Pt concerned about her Blood Pressure was low this morning when she came into clinic for Neulasta injection.  It was 96/35 and HR 101.  Pt states she has no way to check her BP at home but has a physical therapist coming on Monday and he checks it.  Suggested to pt she call her PCP to hold her Accupril this weekend and call back w/ BP on Monday.   Also instructed to drink plenty of fluids.  She verbalized understanding.

## 2013-09-13 ENCOUNTER — Ambulatory Visit: Payer: Medicare HMO | Admitting: Internal Medicine

## 2013-09-13 ENCOUNTER — Telehealth: Payer: Self-pay | Admitting: *Deleted

## 2013-09-13 NOTE — Telephone Encounter (Signed)
Copy of lab results from last week mailed to pt per her request.

## 2013-09-13 NOTE — Telephone Encounter (Signed)
Pt left VM requesting recent labs be mailed to her.

## 2013-09-19 ENCOUNTER — Ambulatory Visit (INDEPENDENT_AMBULATORY_CARE_PROVIDER_SITE_OTHER): Payer: Medicare HMO | Admitting: General Surgery

## 2013-09-20 ENCOUNTER — Other Ambulatory Visit: Payer: Medicare HMO

## 2013-09-21 ENCOUNTER — Telehealth: Payer: Self-pay | Admitting: *Deleted

## 2013-09-21 NOTE — Telephone Encounter (Signed)
Pt states has not been feeling well emotionally or physically and has no one at home to help her.  She wants to cancel her chemo on 8/19 and do the "last treatment" in Sept if ok w/ Dr. Alvy Bimler.  Informed pt she has lab and MD visit on 8/19 and to keep those appts as scheduled.  She can discuss the chemo schedule w/ Dr. Alvy Bimler on her appt..   Suggested to pt she may be feeling better by 8/19 and want to proceed w/ chemo as scheduled.  Pt says she doesn't think so.

## 2013-09-27 ENCOUNTER — Ambulatory Visit (INDEPENDENT_AMBULATORY_CARE_PROVIDER_SITE_OTHER): Payer: Medicare HMO | Admitting: General Surgery

## 2013-10-03 ENCOUNTER — Ambulatory Visit: Payer: Medicare HMO

## 2013-10-03 ENCOUNTER — Other Ambulatory Visit: Payer: Medicare HMO

## 2013-10-03 ENCOUNTER — Ambulatory Visit: Payer: Medicare HMO | Admitting: Hematology and Oncology

## 2013-10-04 ENCOUNTER — Ambulatory Visit: Payer: Medicare HMO

## 2013-10-10 ENCOUNTER — Ambulatory Visit (INDEPENDENT_AMBULATORY_CARE_PROVIDER_SITE_OTHER): Payer: Medicare HMO | Admitting: General Surgery

## 2013-10-12 ENCOUNTER — Telehealth: Payer: Self-pay | Admitting: *Deleted

## 2013-10-12 NOTE — Telephone Encounter (Signed)
Pt left VM asking about dates of her last treatment in September?  She also asks if Dr. Alvy Bimler willing to see her for her history of breast cancer after her current treatment completed?

## 2013-10-12 NOTE — Telephone Encounter (Signed)
My next available 30 mins appt with possible chemo is on 9/16 to see me at 9 am. She will be followed Q 6 months post Rx

## 2013-10-15 ENCOUNTER — Encounter: Payer: Self-pay | Admitting: *Deleted

## 2013-10-15 ENCOUNTER — Telehealth: Payer: Self-pay | Admitting: Hematology and Oncology

## 2013-10-15 ENCOUNTER — Other Ambulatory Visit: Payer: Self-pay | Admitting: Hematology and Oncology

## 2013-10-15 ENCOUNTER — Other Ambulatory Visit: Payer: Self-pay | Admitting: *Deleted

## 2013-10-15 ENCOUNTER — Telehealth: Payer: Self-pay | Admitting: *Deleted

## 2013-10-15 NOTE — Telephone Encounter (Signed)
Per desk RN I have scheduled treatment

## 2013-10-15 NOTE — Telephone Encounter (Signed)
line busy....mailed pt appt sched and letter °

## 2013-10-24 ENCOUNTER — Ambulatory Visit (INDEPENDENT_AMBULATORY_CARE_PROVIDER_SITE_OTHER): Payer: Medicare HMO | Admitting: Internal Medicine

## 2013-10-24 ENCOUNTER — Encounter: Payer: Self-pay | Admitting: Internal Medicine

## 2013-10-24 VITALS — BP 114/64 | HR 99 | Ht 62.0 in | Wt 200.0 lb

## 2013-10-24 DIAGNOSIS — Z853 Personal history of malignant neoplasm of breast: Secondary | ICD-10-CM

## 2013-10-24 DIAGNOSIS — C8330 Diffuse large B-cell lymphoma, unspecified site: Secondary | ICD-10-CM

## 2013-10-24 DIAGNOSIS — G4700 Insomnia, unspecified: Secondary | ICD-10-CM

## 2013-10-24 DIAGNOSIS — Z23 Encounter for immunization: Secondary | ICD-10-CM

## 2013-10-24 DIAGNOSIS — C8589 Other specified types of non-Hodgkin lymphoma, extranodal and solid organ sites: Secondary | ICD-10-CM

## 2013-10-24 DIAGNOSIS — G4730 Sleep apnea, unspecified: Principal | ICD-10-CM

## 2013-10-24 NOTE — Patient Instructions (Signed)
DOT form filled out as requested  We can continue O2 at 3L for sleep  Please call if we can help  Flu vax

## 2013-10-24 NOTE — Assessment & Plan Note (Signed)
She seems calm her and more lucid today and describes better sleep Plan-overnight oximetry on room air

## 2013-10-24 NOTE — Assessment & Plan Note (Signed)
Followed by oncology 

## 2013-10-24 NOTE — Progress Notes (Signed)
Patient ID: [name], female    DOB: Apr 19, 1935, 78 y.o.   MRN: [id number]  HPI 08/20/10- 33 yoF never smoker followed for OSA, dyspnea, rhinitis, complicated by mental health issues( Dr Toy Care) and by hx right mastectomy/ breast Ca Last here 02/05/10- note reviewed. She continues CPAP. Download showed use 3.5 hrs/ night. She says she pulls it off in her sleep.  The download indicated good control AHI 3.9, at 12 cwp best pressure. She seems motivated to continue. She denies waking much during the night.  Since last here she went hospital for cath due to recurrent chest pain- told no coronary blockage.  She no longer has home oxygen. Overnight oximetry on CPAP/ room air 07/21/10- 2 minutes, 20 seconds with sat < 88%. This is good enough to leave O2 off as she wishes.   02/12/11-  78 yoF never smoker followed for OSA, dyspnea, rhinitis, complicated by mental health issues( Dr Toy Care) and by hx right mastectomy/ breast Ca Failed CPAP- kept pulling it off. She does wear oxygen all night at 2 L as intended and sleeps better with this. Watery rhinorrhea in the mornings. She declines flu vaccine. Has avoided recent respiratory infections. She has been following politics closely and was quite sharp about these issues.  09/22/11- 78 yoF never smoker followed for OSA, dyspnea, rhinitis, complicated by mental health issues( Dr Toy Care) and by hx right mastectomy/ breast Ca Acute visit-sore throat x7 days. Raw throat-hurts to swallow-was given Amoxicillin and has helped; runny nose during the day, cough at night. We had called in amoxicillin earlier and she says that helped. OSA- she discussed her CPAP intolerance with Dr.Kaur/ Psychiatry, who blamed anxiety for taking the mask off. She sleeps comfortably using oxygen. She referred to "female" who comes in and out of her house, adjusts her oxygen and other things but apparently has never been seen by anybody. She can just tell he's been there because she sees things have  been moved.  02/04/12- 78 yoF never smoker followed for OSA, dyspnea, rhinitis, complicated by mental health issues( Dr Toy Care) and by hx right mastectomy/ breast Ca FOLLOWS WHQ:PRFFMBW options of going back on CPAP vs just O2(states that she woke up again and "tutti frutti"( invisible man who moves her furniture and takes her mask off at night) took mask off-she was able to put back on and continue sleeping) She declines flu vaccine. She had asked to sleep with a simple mask and oxygen- through [facility], this required 5 L. She has been taping on her mask at night but still finds it pulled off. Security officers have checked her house and under her bed but don't see him because "he curls up under the blanket". CXR 1/27/ 13 IMPRESSION:  No evidence of active pulmonary disease. Cardiac enlargement.  Original Report Authenticated By: Neale Burly, M.D.  04/27/12- 78 yoF never smoker followed for OSA, dyspnea, rhinitis, complicated by mental health issues( Dr Toy Care) and by hx right mastectomy/ breast Ca ACUTE VISIT: coughing thorugh the night that sounds raspy-wheezing, States runny nose and cough is coming from O2 at night. Also, would like to speak with CY about maybe using CPAP again. Has not pulled O2 tubing out of nose at all. She blames "stress" as the reason why she would pull her CPAP of repeatedly at night. She says that stress is now much better and she would like to try CPAP again. Noticing raspy cough especially at night over the last 2 months. Denies pain  in throat for chest. Little sputum. No fever.  03/13/13- 78 yoF never smoker followed for OSA/ failed CPAP, dyspnea, rhinitis, complicated by mental health issues( Dr Toy Care) and by hx right mastectomy/ breast Ca         Aide here from [facility]. FOLLOWS FOR: sleeping well as long as she has her Rx- amitriptyline. Patient reports breathing fine without need for humidifier on her oxygen concentrator. Continuous oxygen 3 L/[facility]. She again  actively discussed her delusional character "[name]", who was never seen by others, has followed her through moves, and enters her apartment through locked doors to unplug her oxygen tubing and take it out of her nose at night, repeatedly take off her CPAP when she was still trying to wear it.  10/24/13- 78 yoF never smoker followed for OSA/ failed CPAP, dyspnea, rhinitis, complicated by mental health issues( Dr Toy Care) and by hx right mastectomy/ breast Ca          FOLLOWS FOR:Pt only uses O2 at night; not a CPAP. Breathing doing well and continues to take physical therapy. DME is [facility]. She would like overnight oximetry to see if she still needs oxygen. She never could keep CPAP on at always insisted that some on the scene person came into her a lot department and would take it off at night. Today she tells me that "[name]" has been arrested and is no longer a threat to her. She asks fill out DOT form about her OSA-done.  Review of Systems-see HPI Constitutional:   No-   weight loss, night sweats, fevers, chills, fatigue, lassitude. HEENT:   No-  headaches, difficulty swallowing, tooth/dental problems, sore throat,       No-  sneezing, itching, ear ache, nasal congestion,  +post nasal drip,  CV:  No-   chest pain, orthopnea, PND, swelling in lower extremities, anasarca, dizziness, palpitations Resp: No- acute  shortness of breath with exertion or at rest, wearing oxygen.            No-   productive cough,  + non-productive cough,  No- coughing up of blood.              No-   change in color of mucus.  No- wheezing.   Skin: No-   rash or lesions. GI:  No-   heartburn, indigestion, abdominal pain, nausea, vomiting,  GU:  MS:  No-   joint pain or swelling.   Neuro-     nothing unusual Psych:  No- change in mood or affect. No acute depression or anxiety.  No memory loss.  Objective:  Physical Exam  General- Alert, Oriented, Affect-cheerful, Distress- none acute, bright and alert,  wheelchair,           Obese Skin- rash-none, lesions- none, excoriation- none Lymphadenopathy- none Head- atraumatic            Eyes- Gross vision intact, PERRLA, conjunctivae clear secretions            Ears- Hearing, canals-normal            Nose- Clear, no-Septal dev, mucus, polyps, erosion, perforation             Throat- Mallampati II , mucosa clear , drainage- none, tonsils- atrophic Neck- flexible , trachea midline, no stridor , thyroid nl, carotid no bruit Chest - symmetrical excursion , unlabored           Heart/CV- RRR , no murmur , no gallop  , no rub, nl s1  s2                           - JVD- none , edema- none, stasis changes- none, varices- none           Lung- clear to P&A, wheeze- none, cough- none , dullness-none, rub- none           Chest wall- right mastectomy Abd-  Br/ Gen/ Rectal- Not done, not indicated Extrem- cyanosis- none, clubbing, none, atrophy- none, strength- nl Neuro- grossly intact to observation

## 2013-10-25 ENCOUNTER — Telehealth: Payer: Self-pay | Admitting: Pulmonary Disease

## 2013-10-25 NOTE — Telephone Encounter (Signed)
CDY pt. ATC pt line busy x 3 wcb

## 2013-10-26 NOTE — Telephone Encounter (Signed)
ATC line busy x 2 WCB 

## 2013-10-26 NOTE — Telephone Encounter (Signed)
Pt returned call- [phone]

## 2013-10-26 NOTE — Telephone Encounter (Signed)
CDY pt.  atc  lmomtcb x 1

## 2013-10-29 NOTE — Telephone Encounter (Signed)
This is a CY pt.  Called and spoke with pt and she stated that the [facility] sent a form to be filled out by CY.  She stated that when CY signed at the bottom, they would like the MD to write a small paragraph to say that the pt is capable of driving a vehicle.  She stated that she would like to have this done a letter for her and mail it to the pts address  That has been verified.  CY please advise. Thanks   Last ov--10/24/2013 Next ov--04/24/2014   No Known Allergies   Current Outpatient Prescriptions on File Prior to Visit  Medication Sig Dispense Refill  . acyclovir (ZOVIRAX) 400 MG tablet Take 1 tablet (400 mg total) by mouth daily.  30 tablet  3  . amitriptyline (ELAVIL) 100 MG tablet Take 100 mg by mouth at bedtime.      Marland Kitchen aspirin 81 MG EC tablet Take 81 mg by mouth every morning.       Marland Kitchen atorvastatin (LIPITOR) 40 MG tablet Take 40 mg by mouth every morning.       . Calcium Carbonate-Vit D-Min (CALTRATE 600+D PLUS) 600-400 MG-UNIT per tablet Take 1 tablet by mouth every morning.       . cholecalciferol (VITAMIN D) 1000 UNITS tablet Take 1,000 Units by mouth every morning.       . Cinnamon 500 MG TABS Take 1,000 mg by mouth every morning.       . Coenzyme Q10 (CO Q 10 PO) Take 1 capsule by mouth every morning.       Marland Kitchen ibuprofen (ADVIL,MOTRIN) 200 MG tablet Take 400 mg by mouth daily as needed for moderate pain.      Marland Kitchen LORazepam (ATIVAN) 2 MG tablet Take 2 mg by mouth 2 (two) times daily.       Marland Kitchen MAGNESIUM OXIDE PO Take 2 tablets by mouth every morning.       . multivitamin-iron-minerals-folic acid (CENTRUM) chewable tablet Chew 1 tablet by mouth daily.      . nitroGLYCERIN (NITROSTAT) 0.4 MG SL tablet Place 1 tablet (0.4 mg total) under the tongue every 5 (five) minutes as needed for chest pain.  25 tablet  1  . Omega-3 Fatty Acids (FISH OIL) 1000 MG CAPS Take 1,000 mg by mouth every morning.       . polyethylene glycol (MIRALAX / GLYCOLAX) packet Take 17 g by mouth 2 (two) times daily.      .  quinapril (ACCUPRIL) 20 MG tablet Take 20 mg by mouth every morning.        No current facility-administered medications on file prior to visit.

## 2013-10-31 ENCOUNTER — Telehealth: Payer: Self-pay | Admitting: Hematology and Oncology

## 2013-10-31 ENCOUNTER — Encounter: Payer: Self-pay | Admitting: Hematology and Oncology

## 2013-10-31 ENCOUNTER — Ambulatory Visit (HOSPITAL_BASED_OUTPATIENT_CLINIC_OR_DEPARTMENT_OTHER): Payer: Medicare HMO

## 2013-10-31 ENCOUNTER — Ambulatory Visit: Payer: Medicare HMO

## 2013-10-31 ENCOUNTER — Ambulatory Visit (HOSPITAL_BASED_OUTPATIENT_CLINIC_OR_DEPARTMENT_OTHER): Payer: Medicare HMO | Admitting: Hematology and Oncology

## 2013-10-31 VITALS — BP 108/66 | HR 90 | Temp 97.8°F

## 2013-10-31 VITALS — BP 107/49 | HR 98 | Temp 98.4°F | Wt 218.4 lb

## 2013-10-31 DIAGNOSIS — R945 Abnormal results of liver function studies: Secondary | ICD-10-CM

## 2013-10-31 DIAGNOSIS — R7989 Other specified abnormal findings of blood chemistry: Secondary | ICD-10-CM

## 2013-10-31 DIAGNOSIS — C8581 Other specified types of non-Hodgkin lymphoma, lymph nodes of head, face, and neck: Secondary | ICD-10-CM

## 2013-10-31 DIAGNOSIS — Z5111 Encounter for antineoplastic chemotherapy: Secondary | ICD-10-CM

## 2013-10-31 DIAGNOSIS — Z95828 Presence of other vascular implants and grafts: Secondary | ICD-10-CM

## 2013-10-31 DIAGNOSIS — C8591 Non-Hodgkin lymphoma, unspecified, lymph nodes of head, face, and neck: Secondary | ICD-10-CM

## 2013-10-31 DIAGNOSIS — C8330 Diffuse large B-cell lymphoma, unspecified site: Secondary | ICD-10-CM

## 2013-10-31 DIAGNOSIS — I2510 Atherosclerotic heart disease of native coronary artery without angina pectoris: Secondary | ICD-10-CM

## 2013-10-31 DIAGNOSIS — Z5112 Encounter for antineoplastic immunotherapy: Secondary | ICD-10-CM

## 2013-10-31 LAB — CBC WITH DIFFERENTIAL/PLATELET
BASO%: 0.2 % (ref 0.0–2.0)
BASOS ABS: 0 10*3/uL (ref 0.0–0.1)
EOS%: 3.4 % (ref 0.0–7.0)
Eosinophils Absolute: 0.3 10*3/uL (ref 0.0–0.5)
HCT: 39 % (ref 34.8–46.6)
HEMOGLOBIN: 12.8 g/dL (ref 11.6–15.9)
LYMPH%: 4.1 % — AB (ref 14.0–49.7)
MCH: 30.3 pg (ref 25.1–34.0)
MCHC: 32.8 g/dL (ref 31.5–36.0)
MCV: 92.3 fL (ref 79.5–101.0)
MONO#: 0.6 10*3/uL (ref 0.1–0.9)
MONO%: 7.9 % (ref 0.0–14.0)
NEUT%: 84.4 % — ABNORMAL HIGH (ref 38.4–76.8)
NEUTROS ABS: 6.9 10*3/uL — AB (ref 1.5–6.5)
Platelets: 223 10*3/uL (ref 145–400)
RBC: 4.23 10*6/uL (ref 3.70–5.45)
RDW: 15.4 % — ABNORMAL HIGH (ref 11.2–14.5)
WBC: 8.1 10*3/uL (ref 3.9–10.3)
lymph#: 0.3 10*3/uL — ABNORMAL LOW (ref 0.9–3.3)

## 2013-10-31 LAB — COMPREHENSIVE METABOLIC PANEL (CC13)
ALT: 42 U/L (ref 0–55)
AST: 30 U/L (ref 5–34)
Albumin: 3.3 g/dL — ABNORMAL LOW (ref 3.5–5.0)
Alkaline Phosphatase: 268 U/L — ABNORMAL HIGH (ref 40–150)
Anion Gap: 10 mEq/L (ref 3–11)
BILIRUBIN TOTAL: 0.61 mg/dL (ref 0.20–1.20)
BUN: 16.3 mg/dL (ref 7.0–26.0)
CO2: 21 mEq/L — ABNORMAL LOW (ref 22–29)
Calcium: 9 mg/dL (ref 8.4–10.4)
Chloride: 108 mEq/L (ref 98–109)
Creatinine: 0.8 mg/dL (ref 0.6–1.1)
GLUCOSE: 140 mg/dL (ref 70–140)
POTASSIUM: 4 meq/L (ref 3.5–5.1)
Sodium: 139 mEq/L (ref 136–145)
TOTAL PROTEIN: 6.6 g/dL (ref 6.4–8.3)

## 2013-10-31 LAB — TECHNOLOGIST REVIEW

## 2013-10-31 MED ORDER — SODIUM CHLORIDE 0.9 % IJ SOLN
10.0000 mL | INTRAMUSCULAR | Status: DC | PRN
  Administered 2013-10-31: 10 mL
  Filled 2013-10-31: qty 10

## 2013-10-31 MED ORDER — DIPHENHYDRAMINE HCL 25 MG PO CAPS
50.0000 mg | ORAL_CAPSULE | Freq: Once | ORAL | Status: AC
  Administered 2013-10-31: 50 mg via ORAL

## 2013-10-31 MED ORDER — SODIUM CHLORIDE 0.9 % IV SOLN: 375.0000 mg/m2 | Freq: Once | INTRAVENOUS | Status: DC

## 2013-10-31 MED ORDER — SODIUM CHLORIDE 0.9 % IV SOLN
90.0000 mg/m2 | Freq: Once | INTRAVENOUS | Status: AC
  Administered 2013-10-31: 198 mg via INTRAVENOUS
  Filled 2013-10-31: qty 2.2

## 2013-10-31 MED ORDER — BENDAMUSTINE HCL CHEMO INJECTION 180 MG/2ML: 90.0000 mg/m2 | Freq: Once | INTRAVENOUS | Status: DC

## 2013-10-31 MED ORDER — SODIUM CHLORIDE 0.9 % IV SOLN
Freq: Once | INTRAVENOUS | Status: AC
  Administered 2013-10-31: 10:00:00 via INTRAVENOUS

## 2013-10-31 MED ORDER — ONDANSETRON 8 MG/50ML IVPB (CHCC)
8.0000 mg | Freq: Once | INTRAVENOUS | Status: AC
  Administered 2013-10-31: 8 mg via INTRAVENOUS

## 2013-10-31 MED ORDER — ACETAMINOPHEN 325 MG PO TABS
650.0000 mg | ORAL_TABLET | Freq: Once | ORAL | Status: AC
  Administered 2013-10-31: 650 mg via ORAL

## 2013-10-31 MED ORDER — ACYCLOVIR 400 MG PO TABS: 400.0000 mg | ORAL_TABLET | Freq: Every day | ORAL | Status: DC

## 2013-10-31 MED ORDER — ONDANSETRON 8 MG/NS 50 ML IVPB
INTRAVENOUS | Status: AC
  Filled 2013-10-31: qty 8

## 2013-10-31 MED ORDER — DEXAMETHASONE SODIUM PHOSPHATE 10 MG/ML IJ SOLN
10.0000 mg | Freq: Once | INTRAMUSCULAR | Status: AC
  Administered 2013-10-31: 10 mg via INTRAVENOUS

## 2013-10-31 MED ORDER — ACETAMINOPHEN 325 MG PO TABS
ORAL_TABLET | ORAL | Status: AC
  Filled 2013-10-31: qty 2

## 2013-10-31 MED ORDER — DIPHENHYDRAMINE HCL 25 MG PO CAPS
ORAL_CAPSULE | ORAL | Status: AC
  Filled 2013-10-31: qty 2

## 2013-10-31 MED ORDER — DEXAMETHASONE SODIUM PHOSPHATE 10 MG/ML IJ SOLN
INTRAMUSCULAR | Status: AC
  Filled 2013-10-31: qty 1

## 2013-10-31 MED ORDER — SODIUM CHLORIDE 0.9 % IV SOLN
375.0000 mg/m2 | Freq: Once | INTRAVENOUS | Status: AC
  Administered 2013-10-31: 800 mg via INTRAVENOUS
  Filled 2013-10-31: qty 80

## 2013-10-31 MED ORDER — SODIUM CHLORIDE 0.9 % IJ SOLN
10.0000 mL | INTRAMUSCULAR | Status: DC | PRN
  Administered 2013-10-31: 10 mL via INTRAVENOUS
  Filled 2013-10-31: qty 10

## 2013-10-31 NOTE — Assessment & Plan Note (Signed)
She missed her appointment last month because she was not feeling well. It has since resolved. Today, she has expressed interest to resume treatment. I plan to give a total of 6 cycles of chemotherapy and radiation with another PET scan at the end of the year.

## 2013-10-31 NOTE — Assessment & Plan Note (Signed)
This is chronic in nature, likely due to the fatty liver disease. I will proceed with treatment without dosage adjustment.

## 2013-10-31 NOTE — Telephone Encounter (Signed)
reprinted pt appts...Marland KitchenMarland Kitchen

## 2013-10-31 NOTE — Progress Notes (Signed)
[name] OFFICE PROGRESS NOTE  Patient Care Team: Tivis Ringer, MD as PCP - General (Internal Medicine)  SUMMARY OF ONCOLOGIC HISTORY: Oncology History   Lymphoma, diffuse large B cell Bone marrow aspirate and biopsy was not performed due to scheduling issues and the need to start treatment ASAP   Primary site: Lymphoid Neoplasms   Staging method: AJCC 6th Edition   Clinical: Stage III signed by Heath Lark, MD on 05/30/2013  8:37 PM   Summary: Stage III       Diffuse large B cell lymphoma   05/02/2013 Procedure She has fine-needle aspirate and biopsy of left cervical lymph node, suspicious for high-grade lymphoma   05/10/2013 Surgery Excisional lymph node biopsy confirmed diffuse large B-cell lymphoma   06/04/2013 Procedure The patient have placement of Infuse-a-Port   06/05/2013 Imaging PET scan showed disease both above and below the diaphragm, consistent with stage III   06/12/2013 -  Chemotherapy The patient will start on cycle 1 of bendamustine with rituximab.   09/03/2013 Imaging Repeat PET CT scan show complete response to treatment.    INTERVAL HISTORY: Please see below for problem oriented charting. She is seen prior to cycle 5 of treatment. She missed her treatment last month because she was not feeling well. She have nonspecific chest pain on and off. She is a complaint of abdominal pain which has subsequently resolved.  REVIEW OF SYSTEMS:   Constitutional: Denies fevers, chills or abnormal weight loss Eyes: Denies blurriness of vision Ears, nose, mouth, throat, and face: Denies mucositis or sore throat Respiratory: Denies cough, dyspnea or wheezes Gastrointestinal:  Denies nausea, heartburn or change in bowel habits Skin: Denies abnormal skin rashes Lymphatics: Denies new lymphadenopathy or easy bruising Neurological:Denies numbness, tingling or new weaknesses Behavioral/Psych: Mood is stable, no new changes  All other systems were reviewed with  the patient and are negative.  I have reviewed the past medical history, past surgical history, social history and family history with the patient and they are unchanged from previous note.  ALLERGIES:  has No Known Allergies.  MEDICATIONS:  Current Outpatient Prescriptions  Medication Sig Dispense Refill  . acyclovir (ZOVIRAX) 400 MG tablet Take 1 tablet (400 mg total) by mouth daily.  30 tablet  3  . amitriptyline (ELAVIL) 100 MG tablet Take 100 mg by mouth at bedtime.      Marland Kitchen aspirin 81 MG EC tablet Take 81 mg by mouth every morning.       Marland Kitchen atorvastatin (LIPITOR) 40 MG tablet Take 40 mg by mouth every morning.       . Calcium Carbonate-Vit D-Min (CALTRATE 600+D PLUS) 600-400 MG-UNIT per tablet Take 1 tablet by mouth every morning.       . cholecalciferol (VITAMIN D) 1000 UNITS tablet Take 1,000 Units by mouth every morning.       . Cinnamon 500 MG TABS Take 1,000 mg by mouth every morning.       . Coenzyme Q10 (CO Q 10 PO) Take 1 capsule by mouth every morning.       Marland Kitchen ibuprofen (ADVIL,MOTRIN) 200 MG tablet Take 400 mg by mouth daily as needed for moderate pain.      Marland Kitchen LORazepam (ATIVAN) 2 MG tablet Take 2 mg by mouth 2 (two) times daily.       Marland Kitchen MAGNESIUM OXIDE PO Take 2 tablets by mouth every morning.       . multivitamin-iron-minerals-folic acid (CENTRUM) chewable tablet Chew 1 tablet by mouth daily.      Marland Kitchen  nitroGLYCERIN (NITROSTAT) 0.4 MG SL tablet Place 1 tablet (0.4 mg total) under the tongue every 5 (five) minutes as needed for chest pain.  25 tablet  1  . Omega-3 Fatty Acids (FISH OIL) 1000 MG CAPS Take 1,000 mg by mouth every morning.       . polyethylene glycol (MIRALAX / GLYCOLAX) packet Take 17 g by mouth 2 (two) times daily.      . quinapril (ACCUPRIL) 20 MG tablet Take 20 mg by mouth every morning.        No current facility-administered medications for this visit.   Facility-Administered Medications Ordered in Other Visits  Medication Dose Route Frequency Provider Last  Rate Last Dose  . sodium chloride 0.9 % injection 10 mL  10 mL Intravenous PRN Heath Lark, MD   10 mL at 10/31/13 0855    PHYSICAL EXAMINATION: ECOG PERFORMANCE STATUS: 1 - Symptomatic but completely ambulatory  Filed Vitals:   10/31/13 0922  BP: 107/49  Pulse: 98  Temp: 98.4 F (36.9 C)   Filed Weights   10/31/13 0922  Weight: 218 lb 6.4 oz (99.066 kg)    GENERAL:alert, no distress and comfortable. She is morbidly obese SKIN: skin color, texture, turgor are normal, no rashes or significant lesions EYES: normal, Conjunctiva are pink and non-injected, sclera clear OROPHARYNX:no exudate, no erythema and lips, buccal mucosa, and tongue normal  NECK: supple, thyroid normal size, non-tender, without nodularity LYMPH:  no palpable lymphadenopathy in the cervical, axillary or inguinal LUNGS: clear to auscultation and percussion with normal breathing effort HEART: regular rate & rhythm and no murmurs and no lower extremity edema ABDOMEN:abdomen soft, non-tender and normal bowel sounds Musculoskeletal:no cyanosis of digits and no clubbing  NEURO: alert & oriented x 3 with fluent speech, no focal motor/sensory deficits  LABORATORY DATA:  I have reviewed the data as listed    Component Value Date/Time   NA 139 10/31/2013 0847   NA 138 06/06/2013 0830   K 4.0 10/31/2013 0847   K 4.2 06/06/2013 0830   CL 103 06/06/2013 0830   CL 107 04/05/2012 1158   CO2 21* 10/31/2013 0847   CO2 23 06/06/2013 0830   GLUCOSE 140 10/31/2013 0847   GLUCOSE 128* 06/06/2013 0830   GLUCOSE 144* 04/05/2012 1158   BUN 16.3 10/31/2013 0847   BUN 15 06/06/2013 0830   CREATININE 0.8 10/31/2013 0847   CREATININE 0.69 06/06/2013 0830   CALCIUM 9.0 10/31/2013 0847   CALCIUM 9.1 06/06/2013 0830   PROT 6.6 10/31/2013 0847   PROT 6.7 06/06/2013 0830   ALBUMIN 3.3* 10/31/2013 0847   ALBUMIN 3.4* 06/06/2013 0830   AST 30 10/31/2013 0847   AST 18 06/06/2013 0830   ALT 42 10/31/2013 0847   ALT 23 06/06/2013 0830   ALKPHOS 268*  10/31/2013 0847   ALKPHOS 231* 06/06/2013 0830   BILITOT 0.61 10/31/2013 0847   BILITOT 0.4 06/06/2013 0830   GFRNONAA 82* 06/06/2013 0830   GFRAA >90 06/06/2013 0830    No results found for this basename: SPEP, UPEP,  kappa and lambda light chains    Lab Results  Component Value Date   WBC 8.1 10/31/2013   NEUTROABS 6.9* 10/31/2013   HGB 12.8 10/31/2013   HCT 39.0 10/31/2013   MCV 92.3 10/31/2013   PLT 223 10/31/2013      Chemistry      Component Value Date/Time   NA 139 10/31/2013 0847   NA 138 06/06/2013 0830   K 4.0 10/31/2013 0847  K 4.2 06/06/2013 0830   CL 103 06/06/2013 0830   CL 107 04/05/2012 1158   CO2 21* 10/31/2013 0847   CO2 23 06/06/2013 0830   BUN 16.3 10/31/2013 0847   BUN 15 06/06/2013 0830   CREATININE 0.8 10/31/2013 0847   CREATININE 0.69 06/06/2013 0830      Component Value Date/Time   CALCIUM 9.0 10/31/2013 0847   CALCIUM 9.1 06/06/2013 0830   ALKPHOS 268* 10/31/2013 0847   ALKPHOS 231* 06/06/2013 0830   AST 30 10/31/2013 0847   AST 18 06/06/2013 0830   ALT 42 10/31/2013 0847   ALT 23 06/06/2013 0830   BILITOT 0.61 10/31/2013 0847   BILITOT 0.4 06/06/2013 0830     ASSESSMENT & PLAN:  Diffuse large B cell lymphoma She missed her appointment last month because she was not feeling well. It has since resolved. Today, she has expressed interest to resume treatment. I plan to give a total of 6 cycles of chemotherapy and radiation with another PET scan at the end of the year.  CAD She has nonspecific chest pain. I am not certain whether this could be related to angina. I recommend she contact her cardiologist for further evaluation and assessment.  Elevated liver function tests This is chronic in nature, likely due to the fatty liver disease. I will proceed with treatment without dosage adjustment.   No orders of the defined types were placed in this encounter.   All questions were answered. The patient knows to call the clinic with any problems, questions or concerns.  No barriers to learning was detected. I spent 30 minutes counseling the patient face to face. The total time spent in the appointment was 40 minutes and more than 50% was on counseling and review of test results     [facility], [place], MD 10/31/2013 9:52 AM

## 2013-10-31 NOTE — Telephone Encounter (Signed)
gv and printed appt sched and avs for opt for Sept adn OCT...sed added tx.

## 2013-10-31 NOTE — Patient Instructions (Signed)

## 2013-10-31 NOTE — Assessment & Plan Note (Signed)
She has nonspecific chest pain. I am not certain whether this could be related to angina. I recommend she contact her cardiologist for further evaluation and assessment.

## 2013-10-31 NOTE — Patient Instructions (Signed)
[facility] Discharge Instructions for Patients Receiving Chemotherapy  Today you received the following chemotherapy agents Rituxan and Gean Quint   To help prevent nausea and vomiting after your treatment, we encourage you to take your nausea medication Zofran   If you develop nausea and vomiting that is not controlled by your nausea medication, call the clinic.   BELOW ARE SYMPTOMS THAT SHOULD BE REPORTED IMMEDIATELY:  *FEVER GREATER THAN 100.5 F  *CHILLS WITH OR WITHOUT FEVER  NAUSEA AND VOMITING THAT IS NOT CONTROLLED WITH YOUR NAUSEA MEDICATION  *UNUSUAL SHORTNESS OF BREATH  *UNUSUAL BRUISING OR BLEEDING  TENDERNESS IN MOUTH AND THROAT WITH OR WITHOUT PRESENCE OF ULCERS  *URINARY PROBLEMS  *BOWEL PROBLEMS  UNUSUAL RASH Items with * indicate a potential emergency and should be followed up as soon as possible.  Feel free to call the clinic you have any questions or concerns. The clinic phone number is [phone].

## 2013-11-01 ENCOUNTER — Telehealth: Payer: Self-pay | Admitting: *Deleted

## 2013-11-01 ENCOUNTER — Ambulatory Visit (HOSPITAL_BASED_OUTPATIENT_CLINIC_OR_DEPARTMENT_OTHER): Payer: Medicare HMO

## 2013-11-01 VITALS — BP 112/49 | HR 98 | Temp 97.0°F

## 2013-11-01 DIAGNOSIS — Z5111 Encounter for antineoplastic chemotherapy: Secondary | ICD-10-CM

## 2013-11-01 DIAGNOSIS — C8330 Diffuse large B-cell lymphoma, unspecified site: Secondary | ICD-10-CM

## 2013-11-01 DIAGNOSIS — C8581 Other specified types of non-Hodgkin lymphoma, lymph nodes of head, face, and neck: Secondary | ICD-10-CM

## 2013-11-01 MED ORDER — SODIUM CHLORIDE 0.9 % IV SOLN
Freq: Once | INTRAVENOUS | Status: AC
  Administered 2013-11-01: 15:00:00 via INTRAVENOUS

## 2013-11-01 MED ORDER — ONDANSETRON 8 MG/NS 50 ML IVPB
INTRAVENOUS | Status: AC
  Filled 2013-11-01: qty 8

## 2013-11-01 MED ORDER — SODIUM CHLORIDE 0.9 % IJ SOLN
10.0000 mL | INTRAMUSCULAR | Status: DC | PRN
  Administered 2013-11-01: 10 mL
  Filled 2013-11-01: qty 10

## 2013-11-01 MED ORDER — SODIUM CHLORIDE 0.9 % IV SOLN
90.0000 mg/m2 | Freq: Once | INTRAVENOUS | Status: AC
  Administered 2013-11-01: 171 mg via INTRAVENOUS
  Filled 2013-11-01: qty 1.9

## 2013-11-01 MED ORDER — DEXAMETHASONE SODIUM PHOSPHATE 10 MG/ML IJ SOLN
INTRAMUSCULAR | Status: AC
  Filled 2013-11-01: qty 1

## 2013-11-01 MED ORDER — DEXAMETHASONE SODIUM PHOSPHATE 10 MG/ML IJ SOLN: 10.0000 mg | Freq: Once | INTRAMUSCULAR | Status: DC

## 2013-11-01 MED ORDER — ONDANSETRON 8 MG/50ML IVPB (CHCC)
8.0000 mg | Freq: Once | INTRAVENOUS | Status: AC
  Administered 2013-11-01: 8 mg via INTRAVENOUS

## 2013-11-01 MED ORDER — HEPARIN SOD (PORK) LOCK FLUSH 100 UNIT/ML IV SOLN
500.0000 [IU] | Freq: Once | INTRAVENOUS | Status: AC | PRN
  Administered 2013-11-01: 500 [IU]
  Filled 2013-11-01: qty 5

## 2013-11-01 NOTE — Patient Instructions (Signed)
[facility] Discharge Instructions for Patients Receiving Chemotherapy  Today you received the following chemotherapy agents: Treanda.  To help prevent nausea and vomiting after your treatment, we encourage you to take your nausea medication as prescribed.   If you develop nausea and vomiting that is not controlled by your nausea medication, call the clinic.   BELOW ARE SYMPTOMS THAT SHOULD BE REPORTED IMMEDIATELY:  *FEVER GREATER THAN 100.5 F  *CHILLS WITH OR WITHOUT FEVER  NAUSEA AND VOMITING THAT IS NOT CONTROLLED WITH YOUR NAUSEA MEDICATION  *UNUSUAL SHORTNESS OF BREATH  *UNUSUAL BRUISING OR BLEEDING  TENDERNESS IN MOUTH AND THROAT WITH OR WITHOUT PRESENCE OF ULCERS  *URINARY PROBLEMS  *BOWEL PROBLEMS  UNUSUAL RASH Items with * indicate a potential emergency and should be followed up as soon as possible.  Feel free to call the clinic you have any questions or concerns. The clinic phone number is [phone].    

## 2013-11-01 NOTE — Telephone Encounter (Signed)
Pt left VM this morning states has some information for Dr. Alvy Bimler.  Called pt back and left her a VM requesting she call nurse back again.

## 2013-11-02 ENCOUNTER — Ambulatory Visit (HOSPITAL_BASED_OUTPATIENT_CLINIC_OR_DEPARTMENT_OTHER): Payer: Medicare HMO

## 2013-11-02 VITALS — BP 133/67 | HR 105 | Temp 99.1°F

## 2013-11-02 DIAGNOSIS — Z5189 Encounter for other specified aftercare: Secondary | ICD-10-CM

## 2013-11-02 DIAGNOSIS — C8591 Non-Hodgkin lymphoma, unspecified, lymph nodes of head, face, and neck: Secondary | ICD-10-CM

## 2013-11-02 DIAGNOSIS — C8581 Other specified types of non-Hodgkin lymphoma, lymph nodes of head, face, and neck: Secondary | ICD-10-CM

## 2013-11-02 MED ORDER — PEGFILGRASTIM INJECTION 6 MG/0.6ML
6.0000 mg | Freq: Once | SUBCUTANEOUS | Status: AC
  Administered 2013-11-02: 6 mg via SUBCUTANEOUS
  Filled 2013-11-02: qty 0.6

## 2013-11-02 NOTE — Telephone Encounter (Signed)
Please advise if this has been taken care of. Thanks.  

## 2013-11-05 ENCOUNTER — Encounter: Payer: Self-pay | Admitting: Internal Medicine

## 2013-11-05 ENCOUNTER — Telehealth: Payer: Self-pay | Admitting: Internal Medicine

## 2013-11-05 ENCOUNTER — Telehealth: Payer: Self-pay | Admitting: Cardiology

## 2013-11-05 DIAGNOSIS — G4700 Insomnia, unspecified: Secondary | ICD-10-CM

## 2013-11-05 DIAGNOSIS — R0602 Shortness of breath: Secondary | ICD-10-CM

## 2013-11-05 DIAGNOSIS — G4730 Sleep apnea, unspecified: Secondary | ICD-10-CM

## 2013-11-05 NOTE — Telephone Encounter (Signed)
Message sent to Dr.Crenshaw's nurse Fredia Beets RN.

## 2013-11-05 NOTE — Telephone Encounter (Signed)
ATC x 2, line busy. WCB. ONO was never ordered at last OV. Per last OV note: Plan-overnight oximetry on room air Order placed, need to advise the pt. Circle Bing, CMA

## 2013-11-05 NOTE — Telephone Encounter (Signed)
Pt called in stating that she mailed a DMV form to Dr.Crenshaw that she needed him to fill out. She would like to know if it was received or not. Please call  Thanks

## 2013-11-05 NOTE — Telephone Encounter (Signed)
Left message for patient, paperwork has been placed in the mail to her.

## 2013-11-05 NOTE — Telephone Encounter (Signed)
Pt called back. Made her aware order was placed.

## 2013-11-06 ENCOUNTER — Telehealth: Payer: Self-pay | Admitting: Cardiology

## 2013-11-06 NOTE — Telephone Encounter (Signed)
Pt wanted you to know that she mailed the [facility] forms in 2 weeks ago to you. She need them filled out asao,if not this week,she will lose her licenses. She need it filled out and mail by the end of this week please. DMV does not take No for an answer.

## 2013-11-06 NOTE — Telephone Encounter (Signed)
Spoke with patient-she is aware that CY has completed letter for her and this has been mailed to her home address.

## 2013-11-06 NOTE — Telephone Encounter (Signed)
Informed patient that Hilda Blades, RN left her a VM yesterday that DMV paperwork was placed in mail to her. Patient voiced understanding.

## 2013-11-07 ENCOUNTER — Telehealth: Payer: Self-pay | Admitting: Internal Medicine

## 2013-11-07 NOTE — Telephone Encounter (Signed)
Spoke with pt. She reports she had ONO done last night and [facility] was coming to pick up the machine today. They were suppose to be there at 2pm. i advised pt she will need to contact them. Nothing further needed

## 2013-11-09 ENCOUNTER — Telehealth: Payer: Self-pay | Admitting: Internal Medicine

## 2013-11-09 NOTE — Telephone Encounter (Signed)
lmtcb x1 

## 2013-11-09 NOTE — Telephone Encounter (Signed)
The letter should had went out on Tuesday 11-06-13 mail service as it was too late on Monday 11-05-13. Thanks.

## 2013-11-09 NOTE — Telephone Encounter (Signed)
Letter is in EPIC from 11/05/13. Pt wants to know if this has been mailed out to her and if so, when? Please advise [name] thanks

## 2013-11-12 ENCOUNTER — Telehealth: Payer: Self-pay | Admitting: *Deleted

## 2013-11-12 ENCOUNTER — Other Ambulatory Visit: Payer: Self-pay | Admitting: Hematology and Oncology

## 2013-11-12 ENCOUNTER — Encounter: Payer: Self-pay | Admitting: Hematology and Oncology

## 2013-11-12 ENCOUNTER — Other Ambulatory Visit: Payer: Self-pay | Admitting: *Deleted

## 2013-11-12 DIAGNOSIS — Z1273 Encounter for screening for malignant neoplasm of ovary: Secondary | ICD-10-CM | POA: Insufficient documentation

## 2013-11-12 DIAGNOSIS — C8330 Diffuse large B-cell lymphoma, unspecified site: Secondary | ICD-10-CM

## 2013-11-12 NOTE — Telephone Encounter (Signed)
Called and spoke with pt and she stated that she did get the letter in the mail.  Nothing further is needed.

## 2013-11-12 NOTE — Telephone Encounter (Signed)
Pt called to say she is "very sick" states she ran out of senakot last week and had to use miralax. Went several days without BM, has IBS. After she had BM on Wednesday she started having severe abdominal pain. Pain is not as bad now, "just feel like I'm dragging and my stomach feels like its dragging". Is very concerned about possibility of having ovarian cancer. Had been tested last year and tumor marker was within normal limits. Would like to have lab repeated ASAP. Would like to sooner than next appointment mid- October

## 2013-11-13 ENCOUNTER — Other Ambulatory Visit: Payer: Medicare HMO

## 2013-11-13 ENCOUNTER — Telehealth: Payer: Self-pay | Admitting: Internal Medicine

## 2013-11-13 ENCOUNTER — Telehealth: Payer: Self-pay | Admitting: Hematology and Oncology

## 2013-11-13 NOTE — Telephone Encounter (Signed)
pt line busy...Marland KitchenMarland Kitchenappt changed to friday

## 2013-11-13 NOTE — Telephone Encounter (Signed)
Katie please advise if Dr Annamaria Boots has received the patient's ONO results as the patient is calling requesting. Thanks.

## 2013-11-13 NOTE — Telephone Encounter (Signed)
[phone] returning call to the nurse

## 2013-11-13 NOTE — Telephone Encounter (Signed)
Results have been explained to patient, pt expressed understanding. Nothing further needed.  

## 2013-11-13 NOTE — Telephone Encounter (Signed)
Results reviewed by CY-shows patient needs to continue O2 at 2L/M QHS through [facility](did ONO). Thanks.

## 2013-11-13 NOTE — Telephone Encounter (Signed)
LMTCB x 1 

## 2013-11-15 ENCOUNTER — Telehealth: Payer: Self-pay | Admitting: *Deleted

## 2013-11-15 NOTE — Telephone Encounter (Signed)
Pt left VM she cannot make it for lab tomorrow as scheduled because she is not feeling well today. Notified Amy w/ Dollene Primrose of message.

## 2013-11-16 ENCOUNTER — Other Ambulatory Visit: Payer: Medicare HMO

## 2013-11-28 ENCOUNTER — Encounter: Payer: Self-pay | Admitting: Hematology and Oncology

## 2013-11-28 ENCOUNTER — Telehealth: Payer: Self-pay | Admitting: Hematology and Oncology

## 2013-11-28 ENCOUNTER — Ambulatory Visit: Payer: Medicare HMO

## 2013-11-28 ENCOUNTER — Other Ambulatory Visit (HOSPITAL_BASED_OUTPATIENT_CLINIC_OR_DEPARTMENT_OTHER): Payer: Medicare HMO

## 2013-11-28 ENCOUNTER — Ambulatory Visit (HOSPITAL_BASED_OUTPATIENT_CLINIC_OR_DEPARTMENT_OTHER): Payer: Medicare HMO | Admitting: Hematology and Oncology

## 2013-11-28 ENCOUNTER — Ambulatory Visit (HOSPITAL_BASED_OUTPATIENT_CLINIC_OR_DEPARTMENT_OTHER): Payer: Medicare HMO

## 2013-11-28 VITALS — BP 130/55 | HR 86 | Temp 98.3°F | Resp 18

## 2013-11-28 VITALS — BP 129/66 | HR 86 | Temp 98.3°F | Resp 16 | Ht 62.0 in | Wt 217.2 lb

## 2013-11-28 DIAGNOSIS — Z1273 Encounter for screening for malignant neoplasm of ovary: Secondary | ICD-10-CM

## 2013-11-28 DIAGNOSIS — Z5112 Encounter for antineoplastic immunotherapy: Secondary | ICD-10-CM

## 2013-11-28 DIAGNOSIS — R945 Abnormal results of liver function studies: Secondary | ICD-10-CM

## 2013-11-28 DIAGNOSIS — C8330 Diffuse large B-cell lymphoma, unspecified site: Secondary | ICD-10-CM

## 2013-11-28 DIAGNOSIS — C8591 Non-Hodgkin lymphoma, unspecified, lymph nodes of head, face, and neck: Secondary | ICD-10-CM

## 2013-11-28 DIAGNOSIS — Z95828 Presence of other vascular implants and grafts: Secondary | ICD-10-CM

## 2013-11-28 DIAGNOSIS — Z5111 Encounter for antineoplastic chemotherapy: Secondary | ICD-10-CM

## 2013-11-28 DIAGNOSIS — R7989 Other specified abnormal findings of blood chemistry: Secondary | ICD-10-CM

## 2013-11-28 LAB — COMPREHENSIVE METABOLIC PANEL (CC13)
ALBUMIN: 3.2 g/dL — AB (ref 3.5–5.0)
ALT: 31 U/L (ref 0–55)
AST: 25 U/L (ref 5–34)
Alkaline Phosphatase: 212 U/L — ABNORMAL HIGH (ref 40–150)
Anion Gap: 9 mEq/L (ref 3–11)
BUN: 14.1 mg/dL (ref 7.0–26.0)
CALCIUM: 9.7 mg/dL (ref 8.4–10.4)
CO2: 23 meq/L (ref 22–29)
Chloride: 107 mEq/L (ref 98–109)
Creatinine: 0.7 mg/dL (ref 0.6–1.1)
Glucose: 108 mg/dl (ref 70–140)
Potassium: 4.2 mEq/L (ref 3.5–5.1)
Sodium: 140 mEq/L (ref 136–145)
TOTAL PROTEIN: 6.2 g/dL — AB (ref 6.4–8.3)
Total Bilirubin: 0.56 mg/dL (ref 0.20–1.20)

## 2013-11-28 LAB — CBC WITH DIFFERENTIAL/PLATELET
BASO%: 0.2 % (ref 0.0–2.0)
Basophils Absolute: 0 10*3/uL (ref 0.0–0.1)
EOS%: 4.8 % (ref 0.0–7.0)
Eosinophils Absolute: 0.4 10*3/uL (ref 0.0–0.5)
HEMATOCRIT: 36.5 % (ref 34.8–46.6)
HGB: 12.4 g/dL (ref 11.6–15.9)
LYMPH%: 4.3 % — AB (ref 14.0–49.7)
MCH: 30.5 pg (ref 25.1–34.0)
MCHC: 34 g/dL (ref 31.5–36.0)
MCV: 89.9 fL (ref 79.5–101.0)
MONO#: 0.6 10*3/uL (ref 0.1–0.9)
MONO%: 7.5 % (ref 0.0–14.0)
NEUT#: 7 10*3/uL — ABNORMAL HIGH (ref 1.5–6.5)
NEUT%: 83.2 % — AB (ref 38.4–76.8)
PLATELETS: 165 10*3/uL (ref 145–400)
RBC: 4.06 10*6/uL (ref 3.70–5.45)
RDW: 15 % — ABNORMAL HIGH (ref 11.2–14.5)
WBC: 8.4 10*3/uL (ref 3.9–10.3)
lymph#: 0.4 10*3/uL — ABNORMAL LOW (ref 0.9–3.3)

## 2013-11-28 MED ORDER — SODIUM CHLORIDE 0.9 % IV SOLN
90.0000 mg/m2 | Freq: Once | INTRAVENOUS | Status: AC
  Administered 2013-11-28: 171 mg via INTRAVENOUS
  Filled 2013-11-28: qty 1.9

## 2013-11-28 MED ORDER — SODIUM CHLORIDE 0.9 % IJ SOLN
10.0000 mL | INTRAMUSCULAR | Status: DC | PRN
  Administered 2013-11-28: 10 mL
  Filled 2013-11-28: qty 10

## 2013-11-28 MED ORDER — DEXAMETHASONE SODIUM PHOSPHATE 10 MG/ML IJ SOLN
INTRAMUSCULAR | Status: AC
  Filled 2013-11-28: qty 1

## 2013-11-28 MED ORDER — ACETAMINOPHEN 325 MG PO TABS
ORAL_TABLET | ORAL | Status: AC
  Filled 2013-11-28: qty 2

## 2013-11-28 MED ORDER — SODIUM CHLORIDE 0.9 % IV SOLN
700.0000 mg | Freq: Once | INTRAVENOUS | Status: AC
  Administered 2013-11-28: 700 mg via INTRAVENOUS
  Filled 2013-11-28: qty 70

## 2013-11-28 MED ORDER — ONDANSETRON 8 MG/50ML IVPB (CHCC)
8.0000 mg | Freq: Once | INTRAVENOUS | Status: AC
  Administered 2013-11-28: 8 mg via INTRAVENOUS

## 2013-11-28 MED ORDER — HEPARIN SOD (PORK) LOCK FLUSH 100 UNIT/ML IV SOLN
500.0000 [IU] | Freq: Once | INTRAVENOUS | Status: AC | PRN
  Administered 2013-11-28: 500 [IU]
  Filled 2013-11-28: qty 5

## 2013-11-28 MED ORDER — HEPARIN SOD (PORK) LOCK FLUSH 100 UNIT/ML IV SOLN
500.0000 [IU] | Freq: Once | INTRAVENOUS | Status: AC
  Administered 2013-11-28: 500 [IU] via INTRAVENOUS
  Filled 2013-11-28: qty 5

## 2013-11-28 MED ORDER — SODIUM CHLORIDE 0.9 % IJ SOLN
10.0000 mL | INTRAMUSCULAR | Status: DC | PRN
  Administered 2013-11-28: 10 mL via INTRAVENOUS
  Filled 2013-11-28: qty 10

## 2013-11-28 MED ORDER — DEXAMETHASONE SODIUM PHOSPHATE 10 MG/ML IJ SOLN
10.0000 mg | Freq: Once | INTRAMUSCULAR | Status: AC
Start: 2013-11-28 — End: 2013-11-28
  Administered 2013-11-28: 10 mg via INTRAVENOUS

## 2013-11-28 MED ORDER — ONDANSETRON 8 MG/NS 50 ML IVPB
INTRAVENOUS | Status: AC
  Filled 2013-11-28: qty 8

## 2013-11-28 MED ORDER — DIPHENHYDRAMINE HCL 25 MG PO CAPS
50.0000 mg | ORAL_CAPSULE | Freq: Once | ORAL | Status: AC
  Administered 2013-11-28: 50 mg via ORAL

## 2013-11-28 MED ORDER — DIPHENHYDRAMINE HCL 25 MG PO CAPS
ORAL_CAPSULE | ORAL | Status: AC
  Filled 2013-11-28: qty 2

## 2013-11-28 MED ORDER — SODIUM CHLORIDE 0.9 % IV SOLN
Freq: Once | INTRAVENOUS | Status: AC
  Administered 2013-11-28: 11:00:00 via INTRAVENOUS

## 2013-11-28 MED ORDER — ACETAMINOPHEN 325 MG PO TABS
650.0000 mg | ORAL_TABLET | Freq: Once | ORAL | Status: AC
  Administered 2013-11-28: 650 mg via ORAL

## 2013-11-28 NOTE — Progress Notes (Signed)
[facility] OFFICE PROGRESS NOTE  Patient Care Team: Tivis Ringer, MD as PCP - General (Internal Medicine)  SUMMARY OF ONCOLOGIC HISTORY: Oncology History   Lymphoma, diffuse large B cell Bone marrow aspirate and biopsy was not performed due to scheduling issues and the need to start treatment ASAP   Primary site: Lymphoid Neoplasms   Staging method: AJCC 6th Edition   Clinical: Stage III signed by Heath Lark, MD on 05/30/2013  8:37 PM   Summary: Stage III       Diffuse large B cell lymphoma   05/02/2013 Procedure She has fine-needle aspirate and biopsy of left cervical lymph node, suspicious for high-grade lymphoma   05/10/2013 Surgery Excisional lymph node biopsy confirmed diffuse large B-cell lymphoma   06/04/2013 Procedure The patient have placement of Infuse-a-Port   06/05/2013 Imaging PET scan showed disease both above and below the diaphragm, consistent with stage III   06/12/2013 - 11/28/2013 Chemotherapy The patient received 6 cycles of bendamustine with rituximab.   09/03/2013 Imaging Repeat PET CT scan show complete response to treatment.    INTERVAL HISTORY: Please see below for problem oriented charting. She is seen prior to cycle 6 of treatment. She complained of intermittent change in bowel habits related to irritable bowel syndrome. She is concerned she may have ovarian cancer  REVIEW OF SYSTEMS:   Constitutional: Denies fevers, chills or abnormal weight loss Eyes: Denies blurriness of vision Ears, nose, mouth, throat, and face: Denies mucositis or sore throat Respiratory: Denies cough, dyspnea or wheezes Cardiovascular: Denies palpitation, chest discomfort or lower extremity swelling Skin: Denies abnormal skin rashes Lymphatics: Denies new lymphadenopathy or easy bruising Neurological:Denies numbness, tingling or new weaknesses Behavioral/Psych: Mood is stable, no new changes  All other systems were reviewed with the patient and are  negative.  I have reviewed the past medical history, past surgical history, social history and family history with the patient and they are unchanged from previous note.  ALLERGIES:  has No Known Allergies.  MEDICATIONS:  Current Outpatient Prescriptions  Medication Sig Dispense Refill  . acyclovir (ZOVIRAX) 400 MG tablet Take 1 tablet (400 mg total) by mouth daily.  30 tablet  3  . amitriptyline (ELAVIL) 100 MG tablet Take 100 mg by mouth at bedtime.      Marland Kitchen aspirin 81 MG EC tablet Take 81 mg by mouth every morning.       Marland Kitchen atorvastatin (LIPITOR) 40 MG tablet Take 40 mg by mouth every morning.       . Calcium Carbonate-Vit D-Min (CALTRATE 600+D PLUS) 600-400 MG-UNIT per tablet Take 1 tablet by mouth every morning.       . cholecalciferol (VITAMIN D) 1000 UNITS tablet Take 1,000 Units by mouth every morning.       . Cinnamon 500 MG TABS Take 1,000 mg by mouth every morning.       . Coenzyme Q10 (CO Q 10 PO) Take 1 capsule by mouth every morning.       Marland Kitchen ibuprofen (ADVIL,MOTRIN) 200 MG tablet Take 400 mg by mouth daily as needed for moderate pain.      Marland Kitchen LORazepam (ATIVAN) 2 MG tablet Take 2 mg by mouth 2 (two) times daily.       Marland Kitchen MAGNESIUM OXIDE PO Take 2 tablets by mouth every morning.       . multivitamin-iron-minerals-folic acid (CENTRUM) chewable tablet Chew 1 tablet by mouth daily.      . nitroGLYCERIN (NITROSTAT) 0.4 MG SL tablet Place  1 tablet (0.4 mg total) under the tongue every 5 (five) minutes as needed for chest pain.  25 tablet  1  . Omega-3 Fatty Acids (FISH OIL) 1000 MG CAPS Take 1,000 mg by mouth every morning.       . polyethylene glycol (MIRALAX / GLYCOLAX) packet Take 17 g by mouth 2 (two) times daily.      . quinapril (ACCUPRIL) 20 MG tablet Take 20 mg by mouth every morning.        No current facility-administered medications for this visit.   Facility-Administered Medications Ordered in Other Visits  Medication Dose Route Frequency Provider Last Rate Last Dose  .  sodium chloride 0.9 % injection 10 mL  10 mL Intracatheter PRN Heath Lark, MD   10 mL at 10/31/13 1400    PHYSICAL EXAMINATION: ECOG PERFORMANCE STATUS: 2 - Symptomatic, <50% confined to bed  Filed Vitals:   11/28/13 0937  BP: 129/66  Pulse: 86  Temp: 98.3 F (36.8 C)  Resp: 16   Filed Weights   11/28/13 0937  Weight: 217 lb 3.2 oz (98.521 kg)    GENERAL:alert, no distress and comfortable. She is morbidly obese, sitting in a wheelchair. She has poor hygiene SKIN: skin color, texture, turgor are normal, no rashes or significant lesions EYES: normal, Conjunctiva are pink and non-injected, sclera clear OROPHARYNX:no exudate, no erythema and lips, buccal mucosa, and tongue normal . Poor dentition is noted NECK: supple, thyroid normal size, non-tender, without nodularity LYMPH:  no palpable lymphadenopathy in the cervical, axillary or inguinal LUNGS: clear to auscultation and percussion with normal breathing effort HEART: regular rate & rhythm and no murmurs and no lower extremity edema ABDOMEN:abdomen soft, non-tender and normal bowel sounds Musculoskeletal:no cyanosis of digits and no clubbing  NEURO: alert & oriented x 3 with fluent speech, no focal motor/sensory deficits  LABORATORY DATA:  I have reviewed the data as listed    Component Value Date/Time   NA 140 11/28/2013 0904   NA 138 06/06/2013 0830   K 4.2 11/28/2013 0904   K 4.2 06/06/2013 0830   CL 103 06/06/2013 0830   CL 107 04/05/2012 1158   CO2 23 11/28/2013 0904   CO2 23 06/06/2013 0830   GLUCOSE 108 11/28/2013 0904   GLUCOSE 128* 06/06/2013 0830   GLUCOSE 144* 04/05/2012 1158   BUN 14.1 11/28/2013 0904   BUN 15 06/06/2013 0830   CREATININE 0.7 11/28/2013 0904   CREATININE 0.69 06/06/2013 0830   CALCIUM 9.7 11/28/2013 0904   CALCIUM 9.1 06/06/2013 0830   PROT 6.2* 11/28/2013 0904   PROT 6.7 06/06/2013 0830   ALBUMIN 3.2* 11/28/2013 0904   ALBUMIN 3.4* 06/06/2013 0830   AST 25 11/28/2013 0904   AST 18 06/06/2013  0830   ALT 31 11/28/2013 0904   ALT 23 06/06/2013 0830   ALKPHOS 212* 11/28/2013 0904   ALKPHOS 231* 06/06/2013 0830   BILITOT 0.56 11/28/2013 0904   BILITOT 0.4 06/06/2013 0830   GFRNONAA 82* 06/06/2013 0830   GFRAA >90 06/06/2013 0830    No results found for this basename: SPEP, UPEP,  kappa and lambda light chains    Lab Results  Component Value Date   WBC 8.4 11/28/2013   NEUTROABS 7.0* 11/28/2013   HGB 12.4 11/28/2013   HCT 36.5 11/28/2013   MCV 89.9 11/28/2013   PLT 165 11/28/2013      Chemistry      Component Value Date/Time   NA 140 11/28/2013 0904   NA 138  06/06/2013 0830   K 4.2 11/28/2013 0904   K 4.2 06/06/2013 0830   CL 103 06/06/2013 0830   CL 107 04/05/2012 1158   CO2 23 11/28/2013 0904   CO2 23 06/06/2013 0830   BUN 14.1 11/28/2013 0904   BUN 15 06/06/2013 0830   CREATININE 0.7 11/28/2013 0904   CREATININE 0.69 06/06/2013 0830      Component Value Date/Time   CALCIUM 9.7 11/28/2013 0904   CALCIUM 9.1 06/06/2013 0830   ALKPHOS 212* 11/28/2013 0904   ALKPHOS 231* 06/06/2013 0830   AST 25 11/28/2013 0904   AST 18 06/06/2013 0830   ALT 31 11/28/2013 0904   ALT 23 06/06/2013 0830   BILITOT 0.56 11/28/2013 0904   BILITOT 0.4 06/06/2013 0830     ASSESSMENT & PLAN:  Diffuse large B cell lymphoma I plan to give a total of 6 cycles of chemotherapy and radiation with another PET scan at the end of the year. We shall proceed with treatment without dose adjustment.    Elevated liver function tests I suspect this could be due to fatty liver disease or increased bone turnover. I will proceed with treatment without dosage adjustment.    Ovarian cancer screening I recommend observation only. PET CT scan next month should revealed abnormalities in the pelvis if she have cancer.   Orders Placed This Encounter  Procedures  . NM PET Image Restag (PS) Skull Base To Thigh    Standing Status: Future     Number of Occurrences:      Standing Expiration Date: 01/28/2015     Order Specific Question:  Reason for Exam (SYMPTOM  OR DIAGNOSIS REQUIRED)    Answer:  lymphoma staging    Order Specific Question:  Preferred imaging location?    Answer:  [facility]   All questions were answered. The patient knows to call the clinic with any problems, questions or concerns. No barriers to learning was detected. I spent 30 minutes counseling the patient face to face. The total time spent in the appointment was 40 minutes and more than 50% was on counseling and review of test results     [facility] [place], [place], MD 11/28/2013 10:08 PM

## 2013-11-28 NOTE — Patient Instructions (Signed)

## 2013-11-28 NOTE — Assessment & Plan Note (Signed)
I recommend observation only. PET CT scan next month should revealed abnormalities in the pelvis if she have cancer.

## 2013-11-28 NOTE — Assessment & Plan Note (Signed)
I plan to give a total of 6 cycles of chemotherapy and radiation with another PET scan at the end of the year. We shall proceed with treatment without dose adjustment.

## 2013-11-28 NOTE — Patient Instructions (Signed)
[facility] Discharge Instructions for Patients Receiving Chemotherapy  Today you received the following chemotherapy agents Rituxan and Treanda. To help prevent nausea and vomiting after your treatment, we encourage you to take your nausea medication as prescribed.   If you develop nausea and vomiting that is not controlled by your nausea medication, call the clinic.   BELOW ARE SYMPTOMS THAT SHOULD BE REPORTED IMMEDIATELY:  *FEVER GREATER THAN 100.5 F  *CHILLS WITH OR WITHOUT FEVER  NAUSEA AND VOMITING THAT IS NOT CONTROLLED WITH YOUR NAUSEA MEDICATION  *UNUSUAL SHORTNESS OF BREATH  *UNUSUAL BRUISING OR BLEEDING  TENDERNESS IN MOUTH AND THROAT WITH OR WITHOUT PRESENCE OF ULCERS  *URINARY PROBLEMS  *BOWEL PROBLEMS  UNUSUAL RASH Items with * indicate a potential emergency and should be followed up as soon as possible.  Feel free to call the clinic you have any questions or concerns. The clinic phone number is [phone].    

## 2013-11-28 NOTE — Assessment & Plan Note (Signed)
I suspect this could be due to fatty liver disease or increased bone turnover. I will proceed with treatment without dosage adjustment.

## 2013-11-29 ENCOUNTER — Telehealth: Payer: Self-pay | Admitting: *Deleted

## 2013-11-29 ENCOUNTER — Other Ambulatory Visit: Payer: Self-pay | Admitting: Hematology and Oncology

## 2013-11-29 ENCOUNTER — Ambulatory Visit: Payer: Medicare HMO

## 2013-11-29 NOTE — Telephone Encounter (Signed)
Dr. Alvy Bimler said ok to r/s day two chemo from today to tomorrow and r/s Neulasta to Monday.  Schedule changed and pt notified of appt tomorrow at 11:30 am and Monday also at 11:30 am.  She verbalized understanding.

## 2013-11-29 NOTE — Telephone Encounter (Signed)
I have no idea that I told the patient to see a dentist. She does not have to go unless she has dental issues

## 2013-11-29 NOTE — Telephone Encounter (Signed)
Pt left message w/ Maudie Mercury in Lab says she needs to r/s her chemo today to tomorrow and Neulasta to Monday.  Pt then left VM on nurse line at 9:46 am.  She reports she has a "severe stress problem" and was unable to sleep last night.

## 2013-11-29 NOTE — Telephone Encounter (Signed)
Per staff message I have moved appts

## 2013-11-29 NOTE — Telephone Encounter (Signed)
Disregard previous note.  It was documented on wrong pt.Marland Kitchen

## 2013-11-29 NOTE — Telephone Encounter (Signed)
Pt needs a letter to her Dentist,  Dr. Owens Shark,  Stating why she is supposed to see him.  Pt says she was told by Dr. Alvy Bimler to see her dentist but they need a letter stating the reason.

## 2013-11-30 ENCOUNTER — Ambulatory Visit (HOSPITAL_BASED_OUTPATIENT_CLINIC_OR_DEPARTMENT_OTHER): Payer: Medicare HMO

## 2013-11-30 ENCOUNTER — Ambulatory Visit: Payer: Medicare HMO

## 2013-11-30 VITALS — BP 112/54 | HR 94 | Temp 98.0°F | Resp 18

## 2013-11-30 DIAGNOSIS — Z5111 Encounter for antineoplastic chemotherapy: Secondary | ICD-10-CM

## 2013-11-30 DIAGNOSIS — C8330 Diffuse large B-cell lymphoma, unspecified site: Secondary | ICD-10-CM

## 2013-11-30 MED ORDER — SODIUM CHLORIDE 0.9 % IV SOLN
90.0000 mg/m2 | Freq: Once | INTRAVENOUS | Status: AC
  Administered 2013-11-30: 171 mg via INTRAVENOUS
  Filled 2013-11-30: qty 1.9

## 2013-11-30 MED ORDER — ONDANSETRON 8 MG/NS 50 ML IVPB
INTRAVENOUS | Status: AC
  Filled 2013-11-30: qty 8

## 2013-11-30 MED ORDER — DEXAMETHASONE SODIUM PHOSPHATE 10 MG/ML IJ SOLN
10.0000 mg | Freq: Once | INTRAMUSCULAR | Status: AC
  Administered 2013-11-30: 10 mg via INTRAVENOUS

## 2013-11-30 MED ORDER — ONDANSETRON 8 MG/50ML IVPB (CHCC)
8.0000 mg | Freq: Once | INTRAVENOUS | Status: AC
  Administered 2013-11-30: 8 mg via INTRAVENOUS

## 2013-11-30 MED ORDER — SODIUM CHLORIDE 0.9 % IJ SOLN
10.0000 mL | INTRAMUSCULAR | Status: DC | PRN
  Administered 2013-11-30: 10 mL
  Filled 2013-11-30: qty 10

## 2013-11-30 MED ORDER — SODIUM CHLORIDE 0.9 % IV SOLN
Freq: Once | INTRAVENOUS | Status: AC
  Administered 2013-11-30: 12:00:00 via INTRAVENOUS

## 2013-11-30 MED ORDER — HEPARIN SOD (PORK) LOCK FLUSH 100 UNIT/ML IV SOLN
500.0000 [IU] | Freq: Once | INTRAVENOUS | Status: AC | PRN
  Administered 2013-11-30: 500 [IU]
  Filled 2013-11-30: qty 5

## 2013-11-30 MED ORDER — DEXAMETHASONE SODIUM PHOSPHATE 10 MG/ML IJ SOLN
INTRAMUSCULAR | Status: AC
  Filled 2013-11-30: qty 1

## 2013-11-30 NOTE — Patient Instructions (Signed)
[facility] Discharge Instructions for Patients Receiving Chemotherapy  Today you received the following chemotherapy agents Treanda. To help prevent nausea and vomiting after your treatment, we encourage you to take your nausea medication as directed.    If you develop nausea and vomiting that is not controlled by your nausea medication, call the clinic.   BELOW ARE SYMPTOMS THAT SHOULD BE REPORTED IMMEDIATELY:  *FEVER GREATER THAN 100.5 F  *CHILLS WITH OR WITHOUT FEVER  NAUSEA AND VOMITING THAT IS NOT CONTROLLED WITH YOUR NAUSEA MEDICATION  *UNUSUAL SHORTNESS OF BREATH  *UNUSUAL BRUISING OR BLEEDING  TENDERNESS IN MOUTH AND THROAT WITH OR WITHOUT PRESENCE OF ULCERS  *URINARY PROBLEMS  *BOWEL PROBLEMS  UNUSUAL RASH Items with * indicate a potential emergency and should be followed up as soon as possible.  Feel free to call the clinic you have any questions or concerns. The clinic phone number is [phone].    

## 2013-12-03 ENCOUNTER — Ambulatory Visit (HOSPITAL_BASED_OUTPATIENT_CLINIC_OR_DEPARTMENT_OTHER): Payer: Medicare HMO

## 2013-12-03 VITALS — BP 119/55 | HR 91 | Temp 98.4°F

## 2013-12-03 DIAGNOSIS — C8591 Non-Hodgkin lymphoma, unspecified, lymph nodes of head, face, and neck: Secondary | ICD-10-CM

## 2013-12-03 DIAGNOSIS — Z5189 Encounter for other specified aftercare: Secondary | ICD-10-CM

## 2013-12-03 MED ORDER — PEGFILGRASTIM INJECTION 6 MG/0.6ML
6.0000 mg | Freq: Once | SUBCUTANEOUS | Status: AC
  Administered 2013-12-03: 6 mg via SUBCUTANEOUS
  Filled 2013-12-03: qty 0.6

## 2013-12-03 NOTE — Patient Instructions (Signed)
Pegfilgrastim injection What is this medicine? PEGFILGRASTIM (peg fil GRA stim) is a long-acting granulocyte colony-stimulating factor that stimulates the growth of neutrophils, a type of white blood cell important in the body's fight against infection. It is used to reduce the incidence of fever and infection in patients with certain types of cancer who are receiving chemotherapy that affects the bone marrow. This medicine may be used for other purposes; ask your health care provider or pharmacist if you have questions. COMMON BRAND NAME(S): Neulasta What should I tell my health care provider before I take this medicine? They need to know if you have any of these conditions: -latex allergy -ongoing radiation therapy -sickle cell disease -skin reactions to acrylic adhesives (On-Body Injector only) -an unusual or allergic reaction to pegfilgrastim, filgrastim, other medicines, foods, dyes, or preservatives -pregnant or trying to get pregnant -breast-feeding How should I use this medicine? This medicine is for injection under the skin. If you get this medicine at home, you will be taught how to prepare and give the pre-filled syringe or how to use the On-body Injector. Refer to the patient Instructions for Use for detailed instructions. Use exactly as directed. Take your medicine at regular intervals. Do not take your medicine more often than directed. It is important that you put your used needles and syringes in a special sharps container. Do not put them in a trash can. If you do not have a sharps container, call your pharmacist or healthcare provider to get one. Talk to your pediatrician regarding the use of this medicine in children. Special care may be needed. Overdosage: If you think you have taken too much of this medicine contact a poison control center or emergency room at once. NOTE: This medicine is only for you. Do not share this medicine with others. What if I miss a dose? It is  important not to miss your dose. Call your doctor or health care professional if you miss your dose. If you miss a dose due to an On-body Injector failure or leakage, a new dose should be administered as soon as possible using a single prefilled syringe for manual use. What may interact with this medicine? Interactions have not been studied. Give your health care provider a list of all the medicines, herbs, non-prescription drugs, or dietary supplements you use. Also tell them if you smoke, drink alcohol, or use illegal drugs. Some items may interact with your medicine. This list may not describe all possible interactions. Give your health care provider a list of all the medicines, herbs, non-prescription drugs, or dietary supplements you use. Also tell them if you smoke, drink alcohol, or use illegal drugs. Some items may interact with your medicine. What should I watch for while using this medicine? You may need blood work done while you are taking this medicine. If you are going to need a MRI, CT scan, or other procedure, tell your doctor that you are using this medicine (On-Body Injector only). What side effects may I notice from receiving this medicine? Side effects that you should report to your doctor or health care professional as soon as possible: -allergic reactions like skin rash, itching or hives, swelling of the face, lips, or tongue -dizziness -fever -pain, redness, or irritation at site where injected -pinpoint red spots on the skin -shortness of breath or breathing problems -stomach or side pain, or pain at the shoulder -swelling -tiredness -trouble passing urine Side effects that usually do not require medical attention (report to your doctor   or health care professional if they continue or are bothersome): -bone pain -muscle pain This list may not describe all possible side effects. Call your doctor for medical advice about side effects. You may report side effects to FDA at  1-800-FDA-1088. Where should I keep my medicine? Keep out of the reach of children. Store pre-filled syringes in a refrigerator between 2 and 8 degrees C (36 and 46 degrees F). Do not freeze. Keep in carton to protect from light. Throw away this medicine if it is left out of the refrigerator for more than 48 hours. Throw away any unused medicine after the expiration date. NOTE: This sheet is a summary. It may not cover all possible information. If you have questions about this medicine, talk to your doctor, pharmacist, or health care provider.  2015, [employer]. (2013-05-03 16:14:05)  

## 2013-12-04 ENCOUNTER — Other Ambulatory Visit: Payer: Self-pay

## 2013-12-04 MED ORDER — QUINAPRIL HCL 20 MG PO TABS: 20.0000 mg | ORAL_TABLET | Freq: Every morning | ORAL | Status: DC

## 2013-12-10 ENCOUNTER — Telehealth: Payer: Self-pay | Admitting: *Deleted

## 2013-12-10 NOTE — Telephone Encounter (Signed)
Pt left VM states her "IBS" has gotten worse. She has stomach pains and says her left ovary is swollen. She is concerned it will turn into Ovarian Cancer.  She wants to know if Dr. Alvy Bimler will order a CT scan.  Left message for Dollene Primrose to return pt's call.

## 2013-12-18 ENCOUNTER — Other Ambulatory Visit (INDEPENDENT_AMBULATORY_CARE_PROVIDER_SITE_OTHER): Payer: Self-pay

## 2013-12-18 DIAGNOSIS — R221 Localized swelling, mass and lump, neck: Secondary | ICD-10-CM

## 2013-12-19 ENCOUNTER — Telehealth: Payer: Self-pay | Admitting: Hematology and Oncology

## 2013-12-19 NOTE — Telephone Encounter (Signed)
returned pt call and r/s 12.2 appt per pt request...lvm for pt with new d.t

## 2013-12-25 ENCOUNTER — Other Ambulatory Visit: Payer: Self-pay | Admitting: Hematology and Oncology

## 2014-01-01 ENCOUNTER — Telehealth: Payer: Self-pay | Admitting: Hematology and Oncology

## 2014-01-01 NOTE — Telephone Encounter (Signed)
Pt called states she is suppose to have her PET scan later and wanted labs moved down. I moved labs but did advise her that she needs to call radiology to get the PET scan showing 9 and not 8.

## 2014-01-01 NOTE — Telephone Encounter (Signed)
pt called to r/s appt..done...pt aware of new d.t °

## 2014-01-07 ENCOUNTER — Other Ambulatory Visit: Payer: Medicare HMO

## 2014-01-07 ENCOUNTER — Ambulatory Visit (HOSPITAL_COMMUNITY): Payer: Medicare HMO

## 2014-01-08 ENCOUNTER — Ambulatory Visit (HOSPITAL_COMMUNITY)
Admission: RE | Admit: 2014-01-08 | Discharge: 2014-01-08 | Disposition: A | Discharge status: Home / Self Care | Payer: Medicare HMO | Source: Ambulatory Visit | Attending: Hematology and Oncology | Admitting: Hematology and Oncology

## 2014-01-08 ENCOUNTER — Other Ambulatory Visit (HOSPITAL_BASED_OUTPATIENT_CLINIC_OR_DEPARTMENT_OTHER): Payer: Medicare HMO

## 2014-01-08 DIAGNOSIS — C8331 Diffuse large B-cell lymphoma, lymph nodes of head, face, and neck: Secondary | ICD-10-CM

## 2014-01-08 DIAGNOSIS — C8591 Non-Hodgkin lymphoma, unspecified, lymph nodes of head, face, and neck: Secondary | ICD-10-CM

## 2014-01-08 DIAGNOSIS — C8330 Diffuse large B-cell lymphoma, unspecified site: Secondary | ICD-10-CM | POA: Insufficient documentation

## 2014-01-08 LAB — COMPREHENSIVE METABOLIC PANEL (CC13)
ALT: 35 U/L (ref 0–55)
AST: 29 U/L (ref 5–34)
Albumin: 3.4 g/dL — ABNORMAL LOW (ref 3.5–5.0)
Alkaline Phosphatase: 211 U/L — ABNORMAL HIGH (ref 40–150)
Anion Gap: 10 mEq/L (ref 3–11)
BUN: 11 mg/dL (ref 7.0–26.0)
CALCIUM: 9.3 mg/dL (ref 8.4–10.4)
CO2: 23 mEq/L (ref 22–29)
Chloride: 108 mEq/L (ref 98–109)
Creatinine: 0.7 mg/dL (ref 0.6–1.1)
GLUCOSE: 91 mg/dL (ref 70–140)
Potassium: 4.2 mEq/L (ref 3.5–5.1)
Sodium: 141 mEq/L (ref 136–145)
Total Bilirubin: 0.59 mg/dL (ref 0.20–1.20)
Total Protein: 6.4 g/dL (ref 6.4–8.3)

## 2014-01-08 LAB — CBC WITH DIFFERENTIAL/PLATELET
BASO%: 0.4 % (ref 0.0–2.0)
BASOS ABS: 0 10*3/uL (ref 0.0–0.1)
EOS%: 2 % (ref 0.0–7.0)
Eosinophils Absolute: 0.2 10*3/uL (ref 0.0–0.5)
HEMATOCRIT: 37.2 % (ref 34.8–46.6)
HEMOGLOBIN: 12.7 g/dL (ref 11.6–15.9)
LYMPH%: 5.9 % — ABNORMAL LOW (ref 14.0–49.7)
MCH: 31.4 pg (ref 25.1–34.0)
MCHC: 34.1 g/dL (ref 31.5–36.0)
MCV: 92.1 fL (ref 79.5–101.0)
MONO#: 0.5 10*3/uL (ref 0.1–0.9)
MONO%: 6.4 % (ref 0.0–14.0)
NEUT#: 6.9 10*3/uL — ABNORMAL HIGH (ref 1.5–6.5)
NEUT%: 85.3 % — AB (ref 38.4–76.8)
Platelets: 205 10*3/uL (ref 145–400)
RBC: 4.04 10*6/uL (ref 3.70–5.45)
RDW: 16.2 % — ABNORMAL HIGH (ref 11.2–14.5)
WBC: 8 10*3/uL (ref 3.9–10.3)
lymph#: 0.5 10*3/uL — ABNORMAL LOW (ref 0.9–3.3)
nRBC: 0 % (ref 0–0)

## 2014-01-08 LAB — GLUCOSE, CAPILLARY: Glucose-Capillary: 101 mg/dL — ABNORMAL HIGH (ref 70–99)

## 2014-01-08 MED ORDER — FLUDEOXYGLUCOSE F - 18 (FDG) INJECTION
11.2000 | Freq: Once | INTRAVENOUS | Status: AC | PRN
  Administered 2014-01-08: 11.2 via INTRAVENOUS

## 2014-01-09 ENCOUNTER — Other Ambulatory Visit (INDEPENDENT_AMBULATORY_CARE_PROVIDER_SITE_OTHER): Payer: Self-pay | Admitting: General Surgery

## 2014-01-09 DIAGNOSIS — Z1231 Encounter for screening mammogram for malignant neoplasm of breast: Secondary | ICD-10-CM

## 2014-01-16 ENCOUNTER — Ambulatory Visit: Payer: Medicare HMO | Admitting: Hematology and Oncology

## 2014-01-17 ENCOUNTER — Encounter: Payer: Self-pay | Admitting: Hematology and Oncology

## 2014-01-17 ENCOUNTER — Telehealth: Payer: Self-pay | Admitting: *Deleted

## 2014-01-17 ENCOUNTER — Telehealth: Payer: Self-pay | Admitting: Hematology and Oncology

## 2014-01-17 ENCOUNTER — Ambulatory Visit (HOSPITAL_BASED_OUTPATIENT_CLINIC_OR_DEPARTMENT_OTHER): Payer: Medicare HMO | Admitting: Hematology and Oncology

## 2014-01-17 VITALS — BP 115/63 | HR 97 | Temp 98.7°F | Resp 17 | Ht 62.0 in

## 2014-01-17 DIAGNOSIS — R945 Abnormal results of liver function studies: Secondary | ICD-10-CM

## 2014-01-17 DIAGNOSIS — R7989 Other specified abnormal findings of blood chemistry: Secondary | ICD-10-CM

## 2014-01-17 DIAGNOSIS — C8330 Diffuse large B-cell lymphoma, unspecified site: Secondary | ICD-10-CM

## 2014-01-17 DIAGNOSIS — Z853 Personal history of malignant neoplasm of breast: Secondary | ICD-10-CM

## 2014-01-17 DIAGNOSIS — N9489 Other specified conditions associated with female genital organs and menstrual cycle: Secondary | ICD-10-CM

## 2014-01-17 NOTE — Telephone Encounter (Signed)
gv and printed appt sched and avs for pt for May...Marland KitchenMarland KitchenErasmo [name] in [place] will call pt and IR will call pt with appt

## 2014-01-17 NOTE — Progress Notes (Signed)
[place] OFFICE PROGRESS NOTE  Patient Care Team: Tivis Ringer, MD as PCP - General (Internal Medicine)  SUMMARY OF ONCOLOGIC HISTORY: Oncology History   Lymphoma, diffuse large B cell Bone marrow aspirate and biopsy was not performed due to scheduling issues and the need to start treatment ASAP   Primary site: Lymphoid Neoplasms   Staging method: AJCC 6th Edition   Clinical: Stage III signed by Heath Lark, MD on 05/30/2013  8:37 PM   Summary: Stage III       Diffuse large B cell lymphoma   05/02/2013 Procedure She has fine-needle aspirate and biopsy of left cervical lymph node, suspicious for high-grade lymphoma   05/10/2013 Surgery Excisional lymph node biopsy confirmed diffuse large B-cell lymphoma   06/04/2013 Procedure The patient have placement of Infuse-a-Port   06/05/2013 Imaging PET scan showed disease both above and below the diaphragm, consistent with stage III   06/12/2013 - 11/28/2013 Chemotherapy The patient received 6 cycles of bendamustine with rituximab.   09/03/2013 Imaging Repeat PET CT scan show complete response to treatment.   01/08/2014 Imaging PET/CT scan showed complete response to treatment    INTERVAL HISTORY: Please see below for problem oriented charting. She feels fine. Denies new lymphadenopathy. She denies any recent abnormal breast examination, palpable mass, abnormal breast appearance or nipple changes She returns to review test results.  REVIEW OF SYSTEMS:   Constitutional: Denies fevers, chills or abnormal weight loss Eyes: Denies blurriness of vision Ears, nose, mouth, throat, and face: Denies mucositis or sore throat Respiratory: Denies cough, dyspnea or wheezes Cardiovascular: Denies palpitation, chest discomfort or lower extremity swelling Gastrointestinal:  Denies nausea, heartburn or change in bowel habits Skin: Denies abnormal skin rashes Lymphatics: Denies new lymphadenopathy or easy bruising Neurological:Denies  numbness, tingling or new weaknesses Behavioral/Psych: Mood is stable, no new changes  All other systems were reviewed with the patient and are negative.  I have reviewed the past medical history, past surgical history, social history and family history with the patient and they are unchanged from previous note.  ALLERGIES:  has No Known Allergies.  MEDICATIONS:  Current Outpatient Prescriptions  Medication Sig Dispense Refill  . acyclovir (ZOVIRAX) 400 MG tablet Take 1 tablet (400 mg total) by mouth daily. 30 tablet 3  . amitriptyline (ELAVIL) 100 MG tablet Take 100 mg by mouth at bedtime.    Marland Kitchen aspirin 81 MG EC tablet Take 81 mg by mouth every morning.     Marland Kitchen atorvastatin (LIPITOR) 40 MG tablet Take 40 mg by mouth every morning.     . Calcium Carbonate-Vit D-Min (CALTRATE 600+D PLUS) 600-400 MG-UNIT per tablet Take 1 tablet by mouth every morning.     . cholecalciferol (VITAMIN D) 1000 UNITS tablet Take 1,000 Units by mouth every morning.     . Cinnamon 500 MG TABS Take 1,000 mg by mouth every morning.     . Coenzyme Q10 (CO Q 10 PO) Take 1 capsule by mouth every morning.     Marland Kitchen ibuprofen (ADVIL,MOTRIN) 200 MG tablet Take 400 mg by mouth daily as needed for moderate pain.    Marland Kitchen LORazepam (ATIVAN) 2 MG tablet Take 2 mg by mouth 2 (two) times daily.     Marland Kitchen MAGNESIUM OXIDE PO Take 2 tablets by mouth every morning.     . multivitamin-iron-minerals-folic acid (CENTRUM) chewable tablet Chew 1 tablet by mouth daily.    . nitroGLYCERIN (NITROSTAT) 0.4 MG SL tablet Place 1 tablet (0.4 mg total) under  the tongue every 5 (five) minutes as needed for chest pain. 25 tablet 1  . Omega-3 Fatty Acids (FISH OIL) 1000 MG CAPS Take 1,000 mg by mouth every morning.     . polyethylene glycol (MIRALAX / GLYCOLAX) packet Take 17 g by mouth 2 (two) times daily.    . quinapril (ACCUPRIL) 20 MG tablet Take 1 tablet (20 mg total) by mouth every morning. 30 tablet 6   No current facility-administered medications for  this visit.    PHYSICAL EXAMINATION: ECOG PERFORMANCE STATUS: 2 - Symptomatic, <50% confined to bed  Filed Vitals:   01/17/14 1108  BP: 115/63  Pulse: 97  Temp: 98.7 F (37.1 C)  Resp: 17   Filed Weights    GENERAL:alert, no distress and comfortable. She is morbidly obese SKIN: skin color, texture, turgor are normal, no rashes or significant lesions EYES: normal, Conjunctiva are pink and non-injected, sclera clear ABDOMEN:abdomen soft, non-tender and normal bowel sounds Musculoskeletal:no cyanosis of digits and no clubbing  NEURO: alert & oriented x 3 with fluent speech, no focal motor/sensory deficits  LABORATORY DATA:  I have reviewed the data as listed    Component Value Date/Time   NA 141 01/08/2014 1503   NA 138 06/06/2013 0830   K 4.2 01/08/2014 1503   K 4.2 06/06/2013 0830   CL 103 06/06/2013 0830   CL 107 04/05/2012 1158   CO2 23 01/08/2014 1503   CO2 23 06/06/2013 0830   GLUCOSE 91 01/08/2014 1503   GLUCOSE 128* 06/06/2013 0830   GLUCOSE 144* 04/05/2012 1158   BUN 11.0 01/08/2014 1503   BUN 15 06/06/2013 0830   CREATININE 0.7 01/08/2014 1503   CREATININE 0.69 06/06/2013 0830   CALCIUM 9.3 01/08/2014 1503   CALCIUM 9.1 06/06/2013 0830   PROT 6.4 01/08/2014 1503   PROT 6.7 06/06/2013 0830   ALBUMIN 3.4* 01/08/2014 1503   ALBUMIN 3.4* 06/06/2013 0830   AST 29 01/08/2014 1503   AST 18 06/06/2013 0830   ALT 35 01/08/2014 1503   ALT 23 06/06/2013 0830   ALKPHOS 211* 01/08/2014 1503   ALKPHOS 231* 06/06/2013 0830   BILITOT 0.59 01/08/2014 1503   BILITOT 0.4 06/06/2013 0830   GFRNONAA 82* 06/06/2013 0830   GFRAA >90 06/06/2013 0830    No results found for: SPEP, UPEP  Lab Results  Component Value Date   WBC 8.0 01/08/2014   NEUTROABS 6.9* 01/08/2014   HGB 12.7 01/08/2014   HCT 37.2 01/08/2014   MCV 92.1 01/08/2014   PLT 205 01/08/2014      Chemistry      Component Value Date/Time   NA 141 01/08/2014 1503   NA 138 06/06/2013 0830   K 4.2  01/08/2014 1503   K 4.2 06/06/2013 0830   CL 103 06/06/2013 0830   CL 107 04/05/2012 1158   CO2 23 01/08/2014 1503   CO2 23 06/06/2013 0830   BUN 11.0 01/08/2014 1503   BUN 15 06/06/2013 0830   CREATININE 0.7 01/08/2014 1503   CREATININE 0.69 06/06/2013 0830      Component Value Date/Time   CALCIUM 9.3 01/08/2014 1503   CALCIUM 9.1 06/06/2013 0830   ALKPHOS 211* 01/08/2014 1503   ALKPHOS 231* 06/06/2013 0830   AST 29 01/08/2014 1503   AST 18 06/06/2013 0830   ALT 35 01/08/2014 1503   ALT 23 06/06/2013 0830   BILITOT 0.59 01/08/2014 1503   BILITOT 0.4 06/06/2013 0830       RADIOGRAPHIC STUDIES: I reviewed the  imaging with her. I have personally reviewed the radiological images as listed and agreed with the findings in the report.  ASSESSMENT & PLAN:  Diffuse large B cell lymphoma The patient has complete response to treatment. I recommend port removal. I plan to see her back in 6 months with repeat history, physical examination and blood work. I will resume imaging study to be done on a yearly basis.  Elevated liver function tests I suspect this could be due to fatty liver disease or increased bone turnover. PET CT scan is negative for liver metastasis of bone metastasis. I recommend close observation only.  Adnexal mass She is concerned about ovarian cancer. Recommend GYN oncology evaluation and she agreed.  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 Continue close follow-up.   Orders Placed This Encounter  Procedures  . IR Removal Tun Access W/ Port W/O FL    Standing Status: Future     Number of Occurrences:      Standing Expiration Date: 03/21/2015    Order Specific Question:  Reason for exam:    Answer:  no longer needed port, please coordinate with her next appt    Order Specific Question:  Preferred Imaging Location?    Answer:  [facility]  . Lactate dehydrogenase     Standing Status: Future     Number of Occurrences:      Standing Expiration Date: 02/21/2015  . Ambulatory referral to Gynecologic Oncology    Referral Priority:  Routine    Referral Type:  Consultation    Referral Reason:  Specialty Services Required    Requested Specialty:  Oncology    Number of Visits Requested:  1   All questions were answered. The patient knows to call the clinic with any problems, questions or concerns. No barriers to learning was detected. I spent 30 minutes counseling the patient face to face. The total time spent in the appointment was 40 minutes and more than 50% was on counseling and review of test results     [facility], [place], MD 01/17/2014 8:58 PM

## 2014-01-17 NOTE — Telephone Encounter (Signed)
Called pt with appt on Friday, December 11 with Dr. Fermin Schwab at 1:15pm and told her to arrive at 12:45 for get registered. Pt agreeable to this appt.

## 2014-01-17 NOTE — Assessment & Plan Note (Signed)
The patient has complete response to treatment. I recommend port removal. I plan to see her back in 6 months with repeat history, physical examination and blood work. I will resume imaging study to be done on a yearly basis.

## 2014-01-17 NOTE — Assessment & Plan Note (Signed)
Physically, she has no signs of recurrence. Continue close follow-up. 

## 2014-01-17 NOTE — Assessment & Plan Note (Signed)
I suspect this could be due to fatty liver disease or increased bone turnover. PET CT scan is negative for liver metastasis of bone metastasis. I recommend close observation only. 

## 2014-01-17 NOTE — Assessment & Plan Note (Signed)
She is concerned about ovarian cancer. Recommend GYN oncology evaluation and she agreed.

## 2014-01-18 ENCOUNTER — Telehealth: Payer: Self-pay | Admitting: *Deleted

## 2014-01-18 NOTE — Telephone Encounter (Signed)
Patient called wanting to reschedule appt. After thinking about it, pt decided she will keep her appt on 01/25/14. Pt wrote down date and time of appt and agreeable to come then. Told pt to call us back before then if she needs to reschedule or has any questions. Pt agreeable to this.

## 2014-01-24 ENCOUNTER — Other Ambulatory Visit: Payer: Self-pay | Admitting: Radiology

## 2014-01-24 ENCOUNTER — Telehealth: Payer: Self-pay | Admitting: Nurse Practitioner

## 2014-01-24 NOTE — Telephone Encounter (Signed)
Patient called to reschedule her appointment on 01/25/14 and states she is unavailable the entire week of 01/28/14. New appt given 02/11/14; patient verbalizes understanding of appt date and time.

## 2014-01-25 ENCOUNTER — Ambulatory Visit: Payer: Medicare HMO | Admitting: Gynecology

## 2014-01-28 ENCOUNTER — Ambulatory Visit (HOSPITAL_COMMUNITY): Admission: RE | Admit: 2014-01-28 | Payer: Medicare HMO | Source: Ambulatory Visit

## 2014-01-28 ENCOUNTER — Inpatient Hospital Stay (HOSPITAL_COMMUNITY): Admission: RE | Admit: 2014-01-28 | Payer: Medicare HMO | Source: Ambulatory Visit

## 2014-01-29 ENCOUNTER — Other Ambulatory Visit: Payer: Self-pay | Admitting: Radiology

## 2014-01-30 ENCOUNTER — Encounter (HOSPITAL_COMMUNITY): Payer: Self-pay

## 2014-01-30 ENCOUNTER — Ambulatory Visit (HOSPITAL_COMMUNITY)
Admission: RE | Admit: 2014-01-30 | Discharge: 2014-01-30 | Disposition: A | Discharge status: Home / Self Care | Payer: Medicare HMO | Source: Ambulatory Visit | Attending: Hematology and Oncology | Admitting: Hematology and Oncology

## 2014-01-30 ENCOUNTER — Ambulatory Visit (HOSPITAL_COMMUNITY): Payer: Medicare HMO

## 2014-01-30 DIAGNOSIS — F329 Major depressive disorder, single episode, unspecified: Secondary | ICD-10-CM | POA: Insufficient documentation

## 2014-01-30 DIAGNOSIS — Z853 Personal history of malignant neoplasm of breast: Secondary | ICD-10-CM | POA: Insufficient documentation

## 2014-01-30 DIAGNOSIS — I2510 Atherosclerotic heart disease of native coronary artery without angina pectoris: Secondary | ICD-10-CM | POA: Diagnosis not present

## 2014-01-30 DIAGNOSIS — C8590 Non-Hodgkin lymphoma, unspecified, unspecified site: Secondary | ICD-10-CM | POA: Insufficient documentation

## 2014-01-30 DIAGNOSIS — F419 Anxiety disorder, unspecified: Secondary | ICD-10-CM | POA: Insufficient documentation

## 2014-01-30 DIAGNOSIS — Z7982 Long term (current) use of aspirin: Secondary | ICD-10-CM | POA: Diagnosis not present

## 2014-01-30 DIAGNOSIS — K219 Gastro-esophageal reflux disease without esophagitis: Secondary | ICD-10-CM | POA: Insufficient documentation

## 2014-01-30 DIAGNOSIS — M1990 Unspecified osteoarthritis, unspecified site: Secondary | ICD-10-CM | POA: Insufficient documentation

## 2014-01-30 DIAGNOSIS — I10 Essential (primary) hypertension: Secondary | ICD-10-CM | POA: Diagnosis not present

## 2014-01-30 DIAGNOSIS — C8330 Diffuse large B-cell lymphoma, unspecified site: Secondary | ICD-10-CM

## 2014-01-30 DIAGNOSIS — E785 Hyperlipidemia, unspecified: Secondary | ICD-10-CM | POA: Insufficient documentation

## 2014-01-30 LAB — CBC WITH DIFFERENTIAL/PLATELET
Basophils Absolute: 0 10*3/uL (ref 0.0–0.1)
Basophils Relative: 0 % (ref 0–1)
Eosinophils Absolute: 0.2 10*3/uL (ref 0.0–0.7)
Eosinophils Relative: 2 % (ref 0–5)
HCT: 37.6 % (ref 36.0–46.0)
Hemoglobin: 12.6 g/dL (ref 12.0–15.0)
LYMPHS ABS: 0.5 10*3/uL — AB (ref 0.7–4.0)
LYMPHS PCT: 6 % — AB (ref 12–46)
MCH: 31.5 pg (ref 26.0–34.0)
MCHC: 33.5 g/dL (ref 30.0–36.0)
MCV: 94 fL (ref 78.0–100.0)
Monocytes Absolute: 0.5 10*3/uL (ref 0.1–1.0)
Monocytes Relative: 6 % (ref 3–12)
NEUTROS PCT: 86 % — AB (ref 43–77)
Neutro Abs: 7.3 10*3/uL (ref 1.7–7.7)
PLATELETS: 218 10*3/uL (ref 150–400)
RBC: 4 MIL/uL (ref 3.87–5.11)
RDW: 15.3 % (ref 11.5–15.5)
WBC: 8.4 10*3/uL (ref 4.0–10.5)

## 2014-01-30 LAB — PROTIME-INR
INR: 0.95 (ref 0.00–1.49)
PROTHROMBIN TIME: 12.8 s (ref 11.6–15.2)

## 2014-01-30 MED ORDER — LIDOCAINE HCL 1 % IJ SOLN
INTRAMUSCULAR | Status: AC
Start: 2014-01-30 — End: 2014-01-30
  Filled 2014-01-30: qty 20

## 2014-01-30 MED ORDER — FENTANYL CITRATE 0.05 MG/ML IJ SOLN
INTRAMUSCULAR | Status: AC
  Filled 2014-01-30: qty 4

## 2014-01-30 MED ORDER — FENTANYL CITRATE 0.05 MG/ML IJ SOLN
INTRAMUSCULAR | Status: AC | PRN
  Administered 2014-01-30 (×2): 25 ug via INTRAVENOUS

## 2014-01-30 MED ORDER — CEFAZOLIN SODIUM-DEXTROSE 2-3 GM-% IV SOLR
INTRAVENOUS | Status: AC
  Filled 2014-01-30: qty 50

## 2014-01-30 MED ORDER — MIDAZOLAM HCL 2 MG/2ML IJ SOLN
INTRAMUSCULAR | Status: AC
  Filled 2014-01-30: qty 6

## 2014-01-30 MED ORDER — MIDAZOLAM HCL 2 MG/2ML IJ SOLN
INTRAMUSCULAR | Status: AC | PRN
  Administered 2014-01-30 (×2): 0.5 mg via INTRAVENOUS

## 2014-01-30 MED ORDER — SODIUM CHLORIDE 0.9 % IV SOLN
INTRAVENOUS | Status: DC
  Administered 2014-01-30: 08:00:00 via INTRAVENOUS

## 2014-01-30 MED ORDER — CEFAZOLIN SODIUM-DEXTROSE 2-3 GM-% IV SOLR
2.0000 g | Freq: Once | INTRAVENOUS | Status: AC
  Administered 2014-01-30: 2 g via INTRAVENOUS

## 2014-01-30 NOTE — H&P (Signed)
Chief Complaint: "I'm here to get my port a cath out" Referring Physician(s): Gorsuch,Ni  History of Present Illness: [name] is a 78 y.o. female with history of diffuse large B cell lymphoma who has completed treatment. She presents today for port a cath removal.  Past Medical History  Diagnosis Date  . Hyperlipidemia     takes Atorvastatin daily  . Chronic headache   . Osteoarthritis   . CAD (coronary artery disease)   . Chronic chest pain   . Constipation   . Malignant neoplasm of breast     right breast  . Hypertension     takes Quinapril daily  . Joint pain   . Low back pain   . Constipation     takes Miralax daily  . GERD (gastroesophageal reflux disease)     takes Nexium daily  . History of colon polyps   . Anxiety     takes Ativan daily  . Panic attacks   . Depression     takes Elavil nightly  . Shortness of breath     dyspnea, uses O2 nasal  3 liters/min. at night only  . Cancer     lymphoma  . Ovarian cancer screening 11/12/2013  . Adnexal mass 01/17/2014  . Sleep apnea     , uses O2 , no CPAP    Past Surgical History  Procedure Laterality Date  . Cholecystectomy    . Right mastectomy    . Tonsillectomy    . Knee surgery Right   . US echocardiography  10/04/2006    EF 55-60%  . Heart stent  May 2011 per pt  . Portacath placement      x 2  . Inner ear surgery    . Cardiac catheterization  2012  . Port removed    . Esophagogastroduodenoscopy    . Colonoscopy    . Cataract surgery Bilateral   . Submandibular gland excision Left 05/10/2013    Procedure: EXCISION LEFT SUBMANDIBULAR NODE;  Surgeon: Jodi Marble, MD;  Location: [place] [facility];  Service: ENT;  Laterality: Left;  . Portacath placement  06/04/13    Allergies: Review of patient's allergies indicates no known allergies.  Medications: Prior to Admission medications   Medication Sig Start Date End Date Taking? Authorizing Provider  acyclovir (ZOVIRAX) 400 MG tablet Take 1 tablet (400  mg total) by mouth daily. Patient taking differently: Take 400 mg by mouth every morning.  10/31/13  Yes Heath Lark, MD  amitriptyline (ELAVIL) 100 MG tablet Take 100 mg by mouth at bedtime.   Yes Historical Provider, MD  aspirin 81 MG EC tablet Take 81 mg by mouth every morning.    Yes Historical Provider, MD  atorvastatin (LIPITOR) 40 MG tablet Take 40 mg by mouth every morning.    Yes Historical Provider, MD  Calcium Carbonate-Vit D-Min (CALTRATE 600+D PLUS) 600-400 MG-UNIT per tablet Take 1 tablet by mouth every morning.    Yes Historical Provider, MD  cholecalciferol (VITAMIN D) 1000 UNITS tablet Take 1,000 Units by mouth every morning.    Yes Historical Provider, MD  Cinnamon 500 MG TABS Take 1,000 mg by mouth every morning.    Yes Historical Provider, MD  Coenzyme Q10 (CO Q 10 PO) Take 1 capsule by mouth every morning.    Yes Historical Provider, MD  ibuprofen (ADVIL,MOTRIN) 200 MG tablet Take 400 mg by mouth daily as needed for moderate pain.   Yes Historical Provider, MD  LORazepam (ATIVAN) 2  MG tablet Take 2 mg by mouth 2 (two) times daily.    Yes Historical Provider, MD  MAGNESIUM OXIDE PO Take 2 tablets by mouth every morning.    Yes Historical Provider, MD  multivitamin-iron-minerals-folic acid (CENTRUM) chewable tablet Chew 1 tablet by mouth every morning.    Yes Historical Provider, MD  nitroGLYCERIN (NITROSTAT) 0.4 MG SL tablet Place 1 tablet (0.4 mg total) under the tongue every 5 (five) minutes as needed for chest pain. 06/29/13  Yes Lelon Perla, MD  Omega-3 Fatty Acids (FISH OIL) 1000 MG CAPS Take 1,000 mg by mouth every morning.    Yes Historical Provider, MD  polyethylene glycol (MIRALAX / GLYCOLAX) packet Take 17 g by mouth 2 (two) times daily.   Yes Historical Provider, MD  quinapril (ACCUPRIL) 20 MG tablet Take 1 tablet (20 mg total) by mouth every morning. 12/04/13  Yes Lelon Perla, MD  Sennosides (SENNA LAX PO) Take 2 tablets by mouth every morning.   Yes Historical  Provider, MD    History reviewed. No pertinent family history.  History   Social History  . Marital Status: Divorced    Spouse Name: N/A    Number of Children: 2  . Years of Education: N/A   Occupational History  . Unemployed    Social History Main Topics  . Smoking status: Never Smoker   . Smokeless tobacco: Never Used  . Alcohol Use: No  . Drug Use: No  . Sexual Activity: Yes    Birth Control/ Protection: Post-menopausal   Other Topics Concern  . None   Social History Narrative        Review of Systems  Constitutional: Negative for fever and chills.  Respiratory: Negative for cough and shortness of breath.   Cardiovascular: Negative for chest pain.  Gastrointestinal: Negative for nausea, vomiting, abdominal pain and blood in stool.  Genitourinary: Negative for dysuria and hematuria.  Musculoskeletal: Negative for back pain.  Neurological: Negative for headaches.  Hematological: Does not bruise/bleed easily.    Vital Signs: BP 99/54 mmHg  Pulse 95  Temp(Src) 98.4 F (36.9 C) (Oral)  Resp 16  Ht 5\' 2"  (1.575 m)  Wt   SpO2 98%  Physical Exam  Constitutional: She is oriented to person, place, and time. She appears well-developed and well-nourished.  Cardiovascular: Normal rate and regular rhythm.   Pulmonary/Chest: Effort normal and breath sounds normal.  Clean, intact rt chest wall PAC  Abdominal: Soft. Bowel sounds are normal. There is no tenderness.  obese  Musculoskeletal: Normal range of motion.  Neurological: She is alert and oriented to person, place, and time.    Imaging: Nm Pet Image Restag (ps) Skull Base To Thigh  01/08/2014   CLINICAL DATA:  Subsequent treatment strategy for lymphoma.  EXAM: NUCLEAR MEDICINE PET SKULL BASE TO THIGH  TECHNIQUE: 11.2 mCi F-18 FDG was injected intravenously. Full-ring PET imaging was performed from the skull base to thigh after the radiotracer. CT data was obtained and used for attenuation correction and  anatomic localization.  FASTING BLOOD GLUCOSE:  Value: 101 mg/dl  COMPARISON:  PET-CT scan 09/03/2013  FINDINGS: NECK  No hypermetabolic lymph nodes in the neck.  CHEST  No hypermetabolic mediastinal or hilar nodes. No suspicious pulmonary nodules on the CT scan.  ABDOMEN/PELVIS  No abnormal hypermetabolic activity within the liver, pancreas, adrenal glands, or spleen. No hypermetabolic lymph nodes in the abdomen or pelvis. There is a 5 cm simple fluid cyst associated with the left adnexa. This  has no associated metabolic activity and not changed from prior representing a benign cystic structure.  SKELETON  No focal hypermetabolic activity to suggest skeletal metastasis.  IMPRESSION: No metabolically active lymph nodes in the neck, chest, abdomen, or pelvis  No evidence lymphoma recurrence.   Electronically Signed   By: Suzy Bouchard M.D.   On: 01/08/2014 16:53    Labs:  CBC:  Recent Labs  10/31/13 0847 11/28/13 0904 01/08/14 1503 01/30/14 0740  WBC 8.1 8.4 8.0 8.4  HGB 12.8 12.4 12.7 12.6  HCT 39.0 36.5 37.2 37.6  PLT 223 165 205 218    COAGS:  Recent Labs  06/04/13 1235 01/30/14 0740  INR 1.01 0.95  APTT 25  --     BMP:  Recent Labs  05/09/13 1330 06/06/13 0830  09/05/13 0837 10/31/13 0847 11/28/13 0904 01/08/14 1503  NA 136* 138  < > 139 139 140 141  K 5.4* 4.2  < > 4.1 4.0 4.2 4.2  CL 100 103  --   --   --   --   --   CO2 22 23  < > 24 21* 23 23  GLUCOSE 137* 128*  < > 108 140 108 91  BUN 20 15  < > 9.7 16.3 14.1 11.0  CALCIUM 9.1 9.1  < > 9.2 9.0 9.7 9.3  CREATININE 0.64 0.69  < > 0.7 0.8 0.7 0.7  GFRNONAA 85* 82*  --   --   --   --   --   GFRAA >90 >90  --   --   --   --   --   < > = values in this interval not displayed.  LIVER FUNCTION TESTS:  Recent Labs  09/05/13 0837 10/31/13 0847 11/28/13 0904 01/08/14 1503  BILITOT 0.63 0.61 0.56 0.59  AST 29 30 25 29   ALT 42 42 31 35  ALKPHOS 234* 268* 212* 211*  PROT 6.1* 6.6 6.2* 6.4  ALBUMIN 3.2*  3.3* 3.2* 3.4*    TUMOR MARKERS: No results for input(s): AFPTM, CEA, CA199, CHROMGRNA in the last 8760 hours.  Assessment and Plan: Kess Mcilwain [place] is a 78 y.o. female with history of diffuse large B cell lymphoma who has completed treatment. She presents today for port a cath removal. Details/risks of procedure d/w pt with her understanding and consent.           Signed: Autumn Messing 01/30/2014, 8:22 AM

## 2014-01-30 NOTE — Discharge Instructions (Signed)
Incision Care An incision is when a surgeon cuts into your body tissues. After surgery, the incision needs to be cared for properly to prevent infection.  HOME CARE INSTRUCTIONS   Take all medicine as directed by your caregiver. Only take over-the-counter or prescription medicines for pain, discomfort, or fever as directed by your caregiver.  Do not remove your bandage (dressing) or get your incision wet for 24 hours. In the event that your dressing becomes wet, dirty, or starts to smell, change the dressing and call your MD for instructions as soon as possible.  Take showers. Do not take tub baths, swim, or do anything that may soak the wound until it is healed.  Resume your normal diet and activities as directed or allowed.  Avoid lifting any weight for 1 week.  Use anti-itch antihistamine medicine as directed by your caregiver. The wound may itch when it is healing. Do not pick or scratch at the wound.  Follow up with your caregiver for stitch (suture) or staple removal as directed.  Drink enough fluids to keep your urine clear or pale yellow. SEEK MEDICAL CARE IF:   You have redness, swelling, or increasing pain in the wound that is not controlled with medicine.  You have drainage, blood, or pus coming from the wound that lasts longer than 1 day.  You develop muscle aches, chills, or a general ill feeling.  You notice a bad smell coming from the wound or dressing.  Your wound edges separate after the sutures, staples, or skin adhesive strips have been removed.  You develop persistent nausea or vomiting. SEEK IMMEDIATE MEDICAL CARE IF:   You have a fever.  You develop a rash.  You develop dizzy episodes or faint while standing.  You have difficulty breathing.  You develop any reaction or side effects to medicine given. MAKE SURE YOU:   Understand these instructions.  Will watch your condition.  Will get help right away if you are not doing well or get  worse. Document Released: 08/21/2004 Document Revised: 04/26/2011 Document Reviewed: 03/28/2013 [facility] Patient Information 2015 [place], [place]. This information is not intended to replace advice given to you by your health care provider. Make sure you discuss any questions you have with your health care provider. Conscious Sedation Sedation is the use of medicines to promote relaxation and relieve discomfort and anxiety. Conscious sedation is a type of sedation. Under conscious sedation you are less alert than normal but are still able to respond to instructions or stimulation. Conscious sedation is used during short medical and dental procedures. It is milder than deep sedation or general anesthesia and allows you to return to your regular activities sooner.  LET [facility] CARE PROVIDER KNOW ABOUT:   Any allergies you have.  All medicines you are taking, including vitamins, herbs, eye drops, creams, and over-the-counter medicines.  Use of steroids (by mouth or creams).  Previous problems you or members of your family have had with the use of anesthetics.  Any blood disorders you have.  Previous surgeries you have had.  Medical conditions you have.  Possibility of pregnancy, if this applies.  Use of cigarettes, alcohol, or illegal drugs. RISKS AND COMPLICATIONS Generally, this is a safe procedure. However, as with any procedure, problems can occur. Possible problems include:  Oversedation.  Trouble breathing on your own. You may need to have a breathing tube until you are awake and breathing on your own.  Allergic reaction to any of the medicines used for the  procedure. BEFORE THE PROCEDURE  You may have blood tests done. These tests can help show how well your kidneys and liver are working. They can also show how well your blood clots.  A physical exam will be done.  Only take medicines as directed by your health care provider. You may need to stop taking medicines (such  as blood thinners, aspirin, or nonsteroidal anti-inflammatory drugs) before the procedure.   Do not eat or drink at least 6 hours before the procedure or as directed by your health care provider.  Arrange for a responsible adult, family member, or friend to take you home after the procedure. He or she should stay with you for at least 24 hours after the procedure, until the medicine has worn off. PROCEDURE   An intravenous (IV) catheter will be inserted into one of your veins. Medicine will be able to flow directly into your body through this catheter. You may be given medicine through this tube to help prevent pain and help you relax.  The medical or dental procedure will be done. AFTER THE PROCEDURE  You will stay in a recovery area until the medicine has worn off. Your blood pressure and pulse will be checked.   Depending on the procedure you had, you may be allowed to go home when you can tolerate liquids and your pain is under control. Document Released: 10/27/2000 Document Revised: 02/06/2013 Document Reviewed: 10/09/2012 [facility] Patient Information 2015 [place], [place]. This information is not intended to replace advice given to you by your health care provider. Make sure you discuss any questions you have with your health care provider.

## 2014-01-30 NOTE — Procedures (Signed)
Interventional Radiology Procedure Note  Procedure: Removal of a right IJ approach single lumen PowerPort.   Complications: No immediate Recommendations:  - Ok to shower tomorrow. - Do not submerge for 7 days   Signed,  Nickcole Bralley S. Yolinda Duerr, DO    

## 2014-01-31 ENCOUNTER — Telehealth: Payer: Self-pay | Admitting: *Deleted

## 2014-01-31 NOTE — Telephone Encounter (Signed)
Pt wanted to know what her blood sugar was yesterday when she was at [facility] for port a cath removal.  Informed pt that her blood sugar was not checked, was not part of the labs that were done.  She verbalized understanding.

## 2014-02-04 ENCOUNTER — Other Ambulatory Visit: Payer: Self-pay | Admitting: Hematology and Oncology

## 2014-02-04 DIAGNOSIS — C8330 Diffuse large B-cell lymphoma, unspecified site: Secondary | ICD-10-CM

## 2014-02-06 ENCOUNTER — Ambulatory Visit (HOSPITAL_COMMUNITY): Payer: Medicare HMO

## 2014-02-11 ENCOUNTER — Ambulatory Visit: Payer: Medicare HMO | Admitting: Gynecology

## 2014-02-13 ENCOUNTER — Ambulatory Visit (HOSPITAL_COMMUNITY)
Admission: RE | Admit: 2014-02-13 | Discharge: 2014-02-13 | Disposition: A | Discharge status: Home / Self Care | Payer: Medicare HMO | Source: Ambulatory Visit | Attending: General Surgery | Admitting: General Surgery

## 2014-02-13 ENCOUNTER — Other Ambulatory Visit (INDEPENDENT_AMBULATORY_CARE_PROVIDER_SITE_OTHER): Payer: Self-pay | Admitting: General Surgery

## 2014-02-13 DIAGNOSIS — Z1231 Encounter for screening mammogram for malignant neoplasm of breast: Secondary | ICD-10-CM | POA: Insufficient documentation

## 2014-02-22 ENCOUNTER — Telehealth: Payer: Self-pay | Admitting: *Deleted

## 2014-02-22 ENCOUNTER — Other Ambulatory Visit (HOSPITAL_COMMUNITY)
Admission: RE | Admit: 2014-02-22 | Discharge: 2014-02-22 | Disposition: A | Discharge status: Home / Self Care | Payer: Medicare Other | Source: Ambulatory Visit | Attending: Gynecology | Admitting: Gynecology

## 2014-02-22 ENCOUNTER — Encounter: Payer: Self-pay | Admitting: Gynecologic Oncology

## 2014-02-22 ENCOUNTER — Ambulatory Visit: Payer: Medicare Other | Attending: Gynecology | Admitting: Gynecologic Oncology

## 2014-02-22 VITALS — BP 103/61 | HR 91 | Temp 98.5°F | Resp 16 | Ht 62.0 in

## 2014-02-22 DIAGNOSIS — Z780 Asymptomatic menopausal state: Secondary | ICD-10-CM | POA: Diagnosis not present

## 2014-02-22 DIAGNOSIS — N8320 Unspecified ovarian cysts: Secondary | ICD-10-CM | POA: Diagnosis not present

## 2014-02-22 DIAGNOSIS — Z01411 Encounter for gynecological examination (general) (routine) with abnormal findings: Secondary | ICD-10-CM | POA: Insufficient documentation

## 2014-02-22 DIAGNOSIS — R7309 Other abnormal glucose: Secondary | ICD-10-CM | POA: Insufficient documentation

## 2014-02-22 DIAGNOSIS — Z95818 Presence of other cardiac implants and grafts: Secondary | ICD-10-CM | POA: Diagnosis not present

## 2014-02-22 DIAGNOSIS — I10 Essential (primary) hypertension: Secondary | ICD-10-CM | POA: Diagnosis not present

## 2014-02-22 DIAGNOSIS — R1909 Other intra-abdominal and pelvic swelling, mass and lump: Secondary | ICD-10-CM | POA: Diagnosis present

## 2014-02-22 DIAGNOSIS — Z79899 Other long term (current) drug therapy: Secondary | ICD-10-CM | POA: Diagnosis not present

## 2014-02-22 DIAGNOSIS — Z7982 Long term (current) use of aspirin: Secondary | ICD-10-CM | POA: Diagnosis not present

## 2014-02-22 DIAGNOSIS — I2510 Atherosclerotic heart disease of native coronary artery without angina pectoris: Secondary | ICD-10-CM | POA: Diagnosis not present

## 2014-02-22 DIAGNOSIS — Z8579 Personal history of other malignant neoplasms of lymphoid, hematopoietic and related tissues: Secondary | ICD-10-CM

## 2014-02-22 DIAGNOSIS — E780 Pure hypercholesterolemia: Secondary | ICD-10-CM | POA: Diagnosis not present

## 2014-02-22 DIAGNOSIS — N9489 Other specified conditions associated with female genital organs and menstrual cycle: Secondary | ICD-10-CM

## 2014-02-22 DIAGNOSIS — Z853 Personal history of malignant neoplasm of breast: Secondary | ICD-10-CM | POA: Insufficient documentation

## 2014-02-22 NOTE — Patient Instructions (Signed)
We will call you with the results of your pap smear. In one year followup with a gynecologist.   Dr. Baltazar Najjar at [facility] - [phone]

## 2014-02-22 NOTE — Progress Notes (Signed)
Consult Note: Gyn-Onc  Consult was requested by Dr. Alvy Bimler for the evaluation of [name] 79 y.o. female with a simple left adnexal mass  CC:  Chief Complaint  Patient presents with  . adnexal mass    Assessment/Plan:  Ms. [name]  is a 79 y.o.  year old morbidly obese woman with a 5 cm simple incidentally found left ovarian mass.  I personally viewed the imaging and films myself and performed a physical examination. I do not believe that this left ovarian cyst is malignant. I discussed with the patient that large clinical trials have shown the unilocular cyst less than 10 cm are almost universally benign. When asymptomatic surgical resection is not indicated. We discussed ovarian cancer carries with it a poor prognosis in most cases however she is at average risk for developing ovarian cancer. I discussed that ovarian cancer does not develop within unilocular cyst but instead develops de novo in new ovarian masses. She does not require follow-up or repeated imaging specific to this mass. I do not expect that it will resolve on its sinuses been present for many years on imaging. I expect that it will be seen on subsequent PET CT study used for surveillance of her lymphoma.   HPI: [name] is a 79 year old G to P2 who is seen in consultation at the request of Dr. Rozetta Nunnery for a left ovarian cyst. The cyst was identified initially identified on PET/CT in April 2015 performed for confirmation of diffuse large B-cell lymphoma. It is a simple unilocular cyst measuring a proximally 5 cm in the left adnexa. It is PET non-avid. There are no associated findings concerning for metastatic ovarian cancer. The cyst is asymptomatic. She has no pain bloating early satiety. Her lymphoma has been adequately treated as of July 2015 at which time she showed a complete response to therapy. She is a remote history of breast cancer for which she is also NED. Her medical comorbidities include morbid obesity,  coronary artery disease, hypercholesterolemia and hypertension, and prediabetes. For her coronary artery disease she is undergone coronary stenting in 2011 and takes aspirin.  Interval History: she continues to have no pelvic pain pressure of bloating.  Current Meds:  Outpatient Encounter Prescriptions as of 02/22/2014  Medication Sig  . acyclovir (ZOVIRAX) 400 MG tablet Take 1 tablet (400 mg total) by mouth daily. (Patient taking differently: Take 400 mg by mouth every morning. )  . amitriptyline (ELAVIL) 100 MG tablet Take 100 mg by mouth at bedtime.  Marland Kitchen aspirin 81 MG EC tablet Take 81 mg by mouth every morning.   Marland Kitchen atorvastatin (LIPITOR) 40 MG tablet Take 40 mg by mouth every morning.   . Calcium Carbonate-Vit D-Min (CALTRATE 600+D PLUS) 600-400 MG-UNIT per tablet Take 1 tablet by mouth every morning.   . cholecalciferol (VITAMIN D) 1000 UNITS tablet Take 1,000 Units by mouth every morning.   . Cinnamon 500 MG TABS Take 1,000 mg by mouth every morning.   . Coenzyme Q10 (CO Q 10 PO) Take 1 capsule by mouth every morning.   Marland Kitchen ibuprofen (ADVIL,MOTRIN) 200 MG tablet Take 400 mg by mouth daily as needed for moderate pain.  Marland Kitchen LORazepam (ATIVAN) 2 MG tablet Take 2 mg by mouth 2 (two) times daily.   Marland Kitchen MAGNESIUM OXIDE PO Take 2 tablets by mouth every morning.   . multivitamin-iron-minerals-folic acid (CENTRUM) chewable tablet Chew 1 tablet by mouth every morning.   . Omega-3 Fatty Acids (FISH OIL) 1000 MG CAPS  Take 1,000 mg by mouth every morning.   . polyethylene glycol (MIRALAX / GLYCOLAX) packet Take 17 g by mouth 2 (two) times daily.  . quinapril (ACCUPRIL) 20 MG tablet Take 1 tablet (20 mg total) by mouth every morning.  . Sennosides (SENNA LAX PO) Take 2 tablets by mouth every morning.  . nitroGLYCERIN (NITROSTAT) 0.4 MG SL tablet Place 1 tablet (0.4 mg total) under the tongue every 5 (five) minutes as needed for chest pain. (Patient not taking: Reported on 02/22/2014)    Allergy: No Known  Allergies  Social Hx:   History   Social History  . Marital Status: Divorced    Spouse Name: N/A    Number of Children: 2  . Years of Education: N/A   Occupational History  . Unemployed    Social History Main Topics  . Smoking status: Never Smoker   . Smokeless tobacco: Never Used  . Alcohol Use: No  . Drug Use: No  . Sexual Activity: Yes    Birth Control/ Protection: Post-menopausal   Other Topics Concern  . Not on file   Social History Narrative    Past Surgical Hx:  Past Surgical History  Procedure Laterality Date  . Cholecystectomy    . Right mastectomy    . Tonsillectomy    . Knee surgery Right   . US echocardiography  10/04/2006    EF 55-60%  . Heart stent  May 2011 per pt  . Portacath placement      x 2  . Inner ear surgery    . Cardiac catheterization  2012  . Port removed    . Esophagogastroduodenoscopy    . Colonoscopy    . Cataract surgery Bilateral   . Submandibular gland excision Left 05/10/2013    Procedure: EXCISION LEFT SUBMANDIBULAR NODE;  Surgeon: Jodi Marble, MD;  Location: [facility];  Service: ENT;  Laterality: Left;  . Portacath placement  06/04/13    Past Medical Hx:  Past Medical History  Diagnosis Date  . Hyperlipidemia     takes Atorvastatin daily  . Chronic headache   . Osteoarthritis   . CAD (coronary artery disease)   . Chronic chest pain   . Constipation   . Malignant neoplasm of breast     right breast  . Hypertension     takes Quinapril daily  . Joint pain   . Low back pain   . Constipation     takes Miralax daily  . GERD (gastroesophageal reflux disease)     takes Nexium daily  . History of colon polyps   . Anxiety     takes Ativan daily  . Panic attacks   . Depression     takes Elavil nightly  . Shortness of breath     dyspnea, uses O2 nasal  3 liters/min. at night only  . Cancer     lymphoma  . Ovarian cancer screening 11/12/2013  . Adnexal mass 01/17/2014  . Sleep apnea     , uses O2 , no CPAP    Past  Gynecological History:  SVD x 2  No LMP recorded. Patient has had a hysterectomy.  Family Hx: History reviewed. No pertinent family history.  Review of Systems:  Constitutional  Feels well,  No complaints  ENT Normal appearing ears and nares bilaterally Skin/Breast  No rash, sores, jaundice, itching, dryness Cardiovascular  No chest pain, shortness of breath, or edema  Pulmonary  No cough or wheeze.  Gastro Intestinal  No nausea,  vomitting, or diarrhoea. No bright red blood per rectum, no abdominal pain, change in bowel movement, or constipation.  Genito Urinary  No frequency, urgency, dysuria, see HPI Musculo Skeletal  No myalgia, arthralgia, joint swelling or pain  Neurologic  No weakness, numbness, change in gait,  Psychology  No depression, anxiety, insomnia.   Vitals:  Blood pressure 103/61, pulse 91, temperature 98.5 F (36.9 C), temperature source Oral, resp. rate 16, height 5\' 2"  (1.575 m), weight 0 lb (0 kg).  Physical Exam: WD in NAD Neck  Supple NROM, without any enlargements.  Lymph Node Survey No cervical supraclavicular or inguinal adenopathy Cardiovascular  Pulse normal rate, regularity and rhythm. S1 and S2 normal.  Lungs  Clear to auscultation bilateraly, without wheezes/crackles/rhonchi. Good air movement.  Skin  No rash/lesions/breakdown  Psychiatry  Alert and oriented to person, place, and time  Abdomen  Normoactive bowel sounds, abdomen soft, non-tender and obese with ventral upper abdominal hernia without incarceration. Well healed cholecystectomy incision sites. Back No CVA tenderness Genito Urinary  Vulva/vagina: Normal external female genitalia.  No lesions. No discharge or bleeding.  Bladder/urethra:  No lesions or masses, well supported bladder  Vagina: No lesions  Cervix: Normal appearing, no lesions.  Uterus: Limited exam due to body habitus. Small, mobile, no parametrial involvement or nodularity.  Adnexa: no palpable masses. Rectal   Good tone, no masses no cul de sac nodularity.  Extremities  No bilateral cyanosis, clubbing or edema.   Donaciano Eva, MD   02/22/2014, 3:51 PM

## 2014-02-22 NOTE — Telephone Encounter (Signed)
Pt left VM states she saw Dr. Denman George and wants to relay a message from Dr. Denman George to Dr. Alvy Bimler.  Dr. Alvy Bimler says she received her office note and is aware of her findings.  Called pt and left her a VM informing her Dr. Alvy Bimler did review office visit notes w/ Dr. Denman George.  Please call us back if any further questions.

## 2014-02-26 ENCOUNTER — Other Ambulatory Visit: Payer: Self-pay | Admitting: *Deleted

## 2014-02-26 LAB — CYTOLOGY - PAP

## 2014-02-27 ENCOUNTER — Other Ambulatory Visit (INDEPENDENT_AMBULATORY_CARE_PROVIDER_SITE_OTHER): Payer: Self-pay | Admitting: *Deleted

## 2014-02-27 ENCOUNTER — Other Ambulatory Visit: Payer: Self-pay | Admitting: General Surgery

## 2014-02-27 DIAGNOSIS — Z853 Personal history of malignant neoplasm of breast: Secondary | ICD-10-CM

## 2014-02-27 DIAGNOSIS — Z9011 Acquired absence of right breast and nipple: Secondary | ICD-10-CM

## 2014-02-28 ENCOUNTER — Other Ambulatory Visit (INDEPENDENT_AMBULATORY_CARE_PROVIDER_SITE_OTHER): Payer: Self-pay | Admitting: *Deleted

## 2014-02-28 ENCOUNTER — Telehealth: Payer: Self-pay | Admitting: *Deleted

## 2014-02-28 DIAGNOSIS — Z853 Personal history of malignant neoplasm of breast: Secondary | ICD-10-CM

## 2014-02-28 NOTE — Telephone Encounter (Signed)
Called patient with results of normal pap smear. Patient appreciative of call.

## 2014-03-11 ENCOUNTER — Telehealth: Payer: Self-pay | Admitting: *Deleted

## 2014-03-11 NOTE — Telephone Encounter (Signed)
MESSAGE IN VM OF TRIAGE  " she told me to call if I have a question I should call and speak with Tammi- Dr Calton Dach nurse- this doesn't pertain to triage but I need to speak with Dr Calton Dach nurse "  [phone] given as return call number.  THIS NOTE WILL BE SENT TO RN AT DESK FOR FOLLOW UP

## 2014-03-11 NOTE — Telephone Encounter (Signed)
Pt asking if she should see Dr. Jana Hakim soon for her annual "breast exam" visit?  Says she is due to have her breasts examined in February.  She says she has mentioned this to Dr. Alvy Bimler but she thinks Dr. Alvy Bimler is only seeing her for the lymphoma and she needs to see Dr. Jana Hakim for her history of breast cancer.   Pt had mammogram in December and it was negative.  Informed pt she just needs to have yearly mammograms and she had been seen by Dr. Alvy Bimler throughout this past year.  She sees Dr. Alvy Bimler again in May this year and I attempted to reassure pt that she does not need to be seen any sooner nor does she need a special appt for her breast cancer.  Assured her Dr. Alvy Bimler is willing to address her breast cancer along w/ the Lymphoma..   Pt was not convinced,  Wants nurse to ask Dr. Alvy Bimler directly about her recommnedations for "breast exam?"  She also thinks it needs to be done sooner than May.

## 2014-03-12 ENCOUNTER — Telehealth: Payer: Self-pay | Admitting: *Deleted

## 2014-03-12 NOTE — Telephone Encounter (Signed)
Informed pt no need to see Dr. Jana Hakim for breast exam,  Dr. Alvy Bimler can do breast exam on next visit in May.   Pt does not need to come in any earlier than May unless she is having problems.  Pt verbalized understanding and states she is actually having MRI of breast done next week as ordered by Surgeon.

## 2014-03-12 NOTE — Telephone Encounter (Signed)
I will examine her for both breast ca and lymphoma in May, not sooner. No need to see Dr. Jana Hakim

## 2014-03-14 ENCOUNTER — Ambulatory Visit (HOSPITAL_COMMUNITY): Admission: RE | Admit: 2014-03-14 | Payer: Medicare Other | Source: Ambulatory Visit

## 2014-03-16 ENCOUNTER — Ambulatory Visit (INDEPENDENT_AMBULATORY_CARE_PROVIDER_SITE_OTHER): Payer: Medicare Other | Admitting: Family Medicine

## 2014-03-16 VITALS — BP 124/74 | HR 94 | Temp 98.2°F | Resp 16

## 2014-03-16 DIAGNOSIS — S91209A Unspecified open wound of unspecified toe(s) with damage to nail, initial encounter: Secondary | ICD-10-CM

## 2014-03-16 DIAGNOSIS — B351 Tinea unguium: Secondary | ICD-10-CM

## 2014-03-16 MED ORDER — MUPIROCIN 2 % EX OINT: 1.0000 "application " | TOPICAL_OINTMENT | Freq: Three times a day (TID) | CUTANEOUS | Status: DC

## 2014-03-16 MED ORDER — CEPHALEXIN 500 MG PO CAPS: 500.0000 mg | ORAL_CAPSULE | Freq: Four times a day (QID) | ORAL | Status: DC

## 2014-03-16 NOTE — Patient Instructions (Signed)
Continue to keep the toe clean. Wash it once or twice daily.  Apply mupirocin ointment twice daily to nailbed for for 5 days or until well-healed up  Take cephalexin antibiotic one 3 times daily to avoid more infection.  Return if worse or go see your foot doctor.

## 2014-03-16 NOTE — Progress Notes (Signed)
Subjective: Patient noted blood around her right large toe around the toenail. She went to clean off and the whole nail was loose and came on all. This was yesterday. She has a foot specialist, but could not get in to see them. This is a Saturday, so she came on over here to our office. This is the toenail is continued to give her problems one is always done well.  Objective: Pleasant lady in no major distress. Has chronic swelling of her feet. Her toe does not look grossly infected, though she has a a lot of crusting on the right large nail plate. There is a portion of retained nail. This was debrided off. The crusting which consisted of old skin fungus was debrided. The nail bed had a tiny bit of bleeding. Patient tolerated the procedure well.  Assessment: Status post evulsion right large toenail, with remaining nail removed. Probable post onychomycosis  Plan: Give antibiotics and treat with topical antibiotics. She has a foot doctor and can follow-up with him or see [place] back. Return if needed.

## 2014-03-18 ENCOUNTER — Telehealth: Payer: Self-pay | Admitting: Internal Medicine

## 2014-03-18 NOTE — Telephone Encounter (Signed)
Attempted to call pt. No answer, no option to leave a message. Will try back. 
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
## 2014-03-18 NOTE — Telephone Encounter (Signed)
She will need to call her DME company if she needs replacement nasal cannula for her home O2

## 2014-03-18 NOTE — Telephone Encounter (Signed)
Pt called back - [phone]

## 2014-03-18 NOTE — Telephone Encounter (Signed)
Spoke with pt, she is aware of this.  Nothing further needed.

## 2014-03-19 ENCOUNTER — Telehealth: Payer: Self-pay | Admitting: Internal Medicine

## 2014-03-19 NOTE — Telephone Encounter (Signed)
Returning call, [phone]

## 2014-03-19 NOTE — Telephone Encounter (Signed)
[facility] PT line rang numerous times [facility]

## 2014-03-19 NOTE — Telephone Encounter (Signed)
Attempted to call pt. No answer, no voicemail. Will try back.

## 2014-03-20 NOTE — Telephone Encounter (Signed)
Called and spoke to pt. Pt stated she called to let CY know she is doing well and is no longer having any SOB. PT denied any further questions or concerns at this time.   Will forward to CY as FYI.

## 2014-03-21 ENCOUNTER — Telehealth: Payer: Self-pay | Admitting: *Deleted

## 2014-03-21 ENCOUNTER — Ambulatory Visit (HOSPITAL_COMMUNITY): Payer: Medicare Other

## 2014-03-21 NOTE — Telephone Encounter (Signed)
Patient called wondering what to look for if her lymphoma comes back.  She states she really didn't have any sx to begin with.   Let her know that per  Dr. Calton Dach note:  She will see her back in 6 months and do lab work, a physical exam and talk with her to see how she is doing. It looks like from her note that she will be doing yearly scans.  Patient appreciated the information.  Let her know to call if she thinks she is having problems.

## 2014-03-28 ENCOUNTER — Other Ambulatory Visit: Payer: Medicare HMO

## 2014-03-28 ENCOUNTER — Ambulatory Visit: Payer: Medicare HMO | Admitting: Oncology

## 2014-03-28 ENCOUNTER — Ambulatory Visit: Payer: Medicare HMO | Admitting: Physician Assistant

## 2014-04-05 ENCOUNTER — Ambulatory Visit: Payer: Medicare HMO | Admitting: Cardiology

## 2014-04-08 ENCOUNTER — Other Ambulatory Visit: Payer: Self-pay | Admitting: *Deleted

## 2014-04-08 ENCOUNTER — Telehealth: Payer: Self-pay | Admitting: *Deleted

## 2014-04-08 MED ORDER — ATORVASTATIN CALCIUM 40 MG PO TABS: 40.0000 mg | ORAL_TABLET | Freq: Every morning | ORAL | Status: DC

## 2014-04-08 NOTE — Telephone Encounter (Signed)
PT. RECEIVED RESULTS ON 02/28/14 FROM [facility]. CHECKED WITH [name] CROSS,NP. NOTIFIED PT. THAT HER 02/22/14 PAP SMEAR WAS NORMAL.

## 2014-04-09 ENCOUNTER — Ambulatory Visit (HOSPITAL_COMMUNITY): Admission: RE | Admit: 2014-04-09 | Payer: Medicare Other | Source: Ambulatory Visit

## 2014-04-11 ENCOUNTER — Telehealth: Payer: Self-pay | Admitting: *Deleted

## 2014-04-11 NOTE — Telephone Encounter (Signed)
I have scheduled her appointment to see me next Friday to review everything and MRI (which is scheduled on 3/3). Please remind her of check in time at 12 pm. She can stop acyclovir

## 2014-04-11 NOTE — Telephone Encounter (Signed)
1)DOES PT. CONTINUE THE ACYCLOVIR 400MG  DAILY? 2) "DR.[place] [facility]. THERE IS A 40% CHANCE HER CANCER MAY COME BACK". PT. WOULD LIKE KNOW WHAT THE POSSIBLE SIGNS AND SYMPTOMS ARE. THIS NOTE TO [place] AND TAMMI HOLLAND,RN.

## 2014-04-11 NOTE — Telephone Encounter (Signed)
INFORMED PT. OF HER APPOINTMENT ON 04/19/14 WITH DR.GORSUCH. INSTRUCTED PT. TO BE AT THIS OFFICE AT 12:00NOON. ENCOURAGED PT. TO WRITE DOWN ANY QUESTIONS OR CONCERNS TOASK DR.GORSUCH. ALSO REMINDED PT.OF HER MRI AT [place] [facility] ON 04/18/14. WRITTEN ORDER PER DR.GORSUCH- PT. MAY STOP ACYCLOVIR. NOTIFIED PT. SHE VOICES UNDERSTANDING.

## 2014-04-15 ENCOUNTER — Telehealth: Payer: Self-pay | Admitting: *Deleted

## 2014-04-15 ENCOUNTER — Other Ambulatory Visit: Payer: Self-pay | Admitting: *Deleted

## 2014-04-15 ENCOUNTER — Telehealth: Payer: Self-pay | Admitting: Hematology and Oncology

## 2014-04-15 NOTE — Telephone Encounter (Signed)
MD visit r/s per 02/29 POF, from 03/04 to 03/11, MD/desk nurse lft msg I mailed new schedule to pt... KJ

## 2014-04-15 NOTE — Telephone Encounter (Addendum)
LEFT PT. A MESSAGE ON HER HOME VOICE MAIL. HER APPOINTMENT WITH DR,GORSUCH HAS BEEN CHANGED TO 04/26/14 WHICH IS Friday AT 2:00PM PER PT.'S REQUEST. POF TO SCHEDULER.

## 2014-04-15 NOTE — Telephone Encounter (Signed)
PT. ALREADY HAS FOUR APPOINTMENTS THIS WEEK WHICH IS VERY STRESSFUL FOR HER. SHE WOULD LIKE TO CHANGE HER APPOINTMENT ON 04/19/14 TO EITHER Monday, 04/22/14 OR Friday, 04/26/14.

## 2014-04-15 NOTE — Telephone Encounter (Signed)
Per patient request can we make it 2 pm, on 3/11, 30 mins? Instead of 3/4

## 2014-04-16 ENCOUNTER — Telehealth: Payer: Self-pay | Admitting: *Deleted

## 2014-04-16 NOTE — Telephone Encounter (Signed)
Tried calling patient at home to inform her that her appointment with Dr. Alvy Bimler is Friday, March, 11th at 2:00 pm. Was not able to leave a message on home answering machine.

## 2014-04-17 ENCOUNTER — Telehealth: Payer: Self-pay | Admitting: *Deleted

## 2014-04-17 NOTE — Telephone Encounter (Signed)
"  I know I have an appointment April 24, 2014 but I don't know what time."  Informed her appointment is 2:00 pm.  No further request at this time.

## 2014-04-18 ENCOUNTER — Ambulatory Visit (HOSPITAL_COMMUNITY)
Admission: RE | Admit: 2014-04-18 | Discharge: 2014-04-18 | Disposition: A | Discharge status: Home / Self Care | Payer: Medicare Other | Source: Ambulatory Visit | Attending: General Surgery | Admitting: General Surgery

## 2014-04-18 DIAGNOSIS — Z853 Personal history of malignant neoplasm of breast: Secondary | ICD-10-CM | POA: Diagnosis present

## 2014-04-18 DIAGNOSIS — Z8572 Personal history of non-Hodgkin lymphomas: Secondary | ICD-10-CM | POA: Insufficient documentation

## 2014-04-18 LAB — POCT I-STAT CREATININE: Creatinine, Ser: 0.8 mg/dL (ref 0.50–1.10)

## 2014-04-18 MED ORDER — GADOBENATE DIMEGLUMINE 529 MG/ML IV SOLN
15.0000 mL | Freq: Once | INTRAVENOUS | Status: AC | PRN
  Administered 2014-04-18: 15 mL via INTRAVENOUS

## 2014-04-19 ENCOUNTER — Ambulatory Visit: Payer: Medicare Other | Admitting: Hematology and Oncology

## 2014-04-19 ENCOUNTER — Telehealth: Payer: Self-pay | Admitting: *Deleted

## 2014-04-19 NOTE — Telephone Encounter (Signed)
DR.ROSENBOWER ORDERED THE MRI. PT. ASKED [facility] TO SEND A COPY OF THE REPORT TO [place] BUT SHE WAS TOLD THAT [facility] WOULD NEED TO REQUEST THE MRI REPORT. PT. WANTED DR.GORSUCH TO BE AWARE THAT MRI WAS PERFORMED. INFORMED PT.THAT DR.GORSUCH IS ABLE TO SEE THE MRI IN THE SYSTEM AND THIS NOTE WILL BE SENT TO HER.

## 2014-04-19 NOTE — Telephone Encounter (Signed)
Got it - thanks.

## 2014-04-23 ENCOUNTER — Telehealth: Payer: Self-pay | Admitting: Hematology and Oncology

## 2014-04-23 ENCOUNTER — Telehealth: Payer: Self-pay

## 2014-04-23 NOTE — Telephone Encounter (Signed)
We can reschedule her Friday appt to another day. Tell her to call back when she feels better. Ok to cancel her appt on Friday

## 2014-04-23 NOTE — Telephone Encounter (Signed)
Number is incorrect. Unable to confirm with patient appointment for 03/11 has been canceled per pof

## 2014-04-23 NOTE — Telephone Encounter (Signed)
Attempted to call pt, no answer. 

## 2014-04-23 NOTE — Telephone Encounter (Signed)
Pt lvm she is really dragging, she is not feeling well, she is trying to stay in bed all the time. She is not going to try to come in on Friday. Called pt back and LVM. She does have an appt tomorrow with [facility]. Encouraged her to try to keep that appt to see if there is anything that needs to be done right away. Forwarded this message to Dr Alvy Bimler.

## 2014-04-23 NOTE — Telephone Encounter (Signed)
Attempted to call pt and [name] to relay dr [employer]. No answer either phone.

## 2014-04-24 ENCOUNTER — Ambulatory Visit (INDEPENDENT_AMBULATORY_CARE_PROVIDER_SITE_OTHER): Payer: Medicare Other | Admitting: Internal Medicine

## 2014-04-24 ENCOUNTER — Encounter: Payer: Self-pay | Admitting: Internal Medicine

## 2014-04-24 VITALS — BP 120/60 | HR 88 | Ht 62.0 in

## 2014-04-24 DIAGNOSIS — G4730 Sleep apnea, unspecified: Secondary | ICD-10-CM

## 2014-04-24 DIAGNOSIS — F22 Delusional disorders: Secondary | ICD-10-CM

## 2014-04-24 DIAGNOSIS — G4700 Insomnia, unspecified: Secondary | ICD-10-CM

## 2014-04-24 NOTE — Assessment & Plan Note (Signed)
She has a fixed chronic delusion that she is visited at night by "[employer]", who oxygen line, takes oxygen and CPAP off. Plan follow-up with Dr.Kaur as scheduled

## 2014-04-24 NOTE — Progress Notes (Signed)
Patient ID: Everlena Cooper Christine, female    DOB: Apr 19, 1935, 79 y.o.   MRN: [id number]  HPI 08/20/10- 33 yoF never smoker followed for OSA, dyspnea, rhinitis, complicated by mental health issues( Dr Toy Care) and by hx right mastectomy/ breast Ca Last here 02/05/10- note reviewed. She continues CPAP. Download showed use 3.5 hrs/ night. She says she pulls it off in her sleep.  The download indicated good control AHI 3.9, at 12 cwp best pressure. She seems motivated to continue. She denies waking much during the night.  Since last here she went hospital for cath due to recurrent chest pain- told no coronary blockage.  She no longer has home oxygen. Overnight oximetry on CPAP/ room air 07/21/10- 2 minutes, 20 seconds with sat < 88%. This is good enough to leave O2 off as she wishes.   02/12/11-  75 yoF never smoker followed for OSA, dyspnea, rhinitis, complicated by mental health issues( Dr Toy Care) and by hx right mastectomy/ breast Ca Failed CPAP- kept pulling it off. She does wear oxygen all night at 2 L as intended and sleeps better with this. Watery rhinorrhea in the mornings. She declines flu vaccine. Has avoided recent respiratory infections. She has been following politics closely and was quite sharp about these issues.  09/22/11- 76 yoF never smoker followed for OSA, dyspnea, rhinitis, complicated by mental health issues( Dr Toy Care) and by hx right mastectomy/ breast Ca Acute visit-sore throat x7 days. Raw throat-hurts to swallow-was given Amoxicillin and has helped; runny nose during the day, cough at night. We had called in amoxicillin earlier and she says that helped. OSA- she discussed her CPAP intolerance with Dr.Kaur/ Psychiatry, who blamed anxiety for taking the mask off. She sleeps comfortably using oxygen. She referred to "female" who comes in and out of her house, adjusts her oxygen and other things but apparently has never been seen by anybody. She can just tell he's been there because she sees things have  been moved.  02/04/12- 76 yoF never smoker followed for OSA, dyspnea, rhinitis, complicated by mental health issues( Dr Toy Care) and by hx right mastectomy/ breast Ca FOLLOWS WHQ:PRFFMBW options of going back on CPAP vs just O2(states that she woke up again and "tutti frutti"( invisible man who moves her furniture and takes her mask off at night) took mask off-she was able to put back on and continue sleeping) She declines flu vaccine. She had asked to sleep with a simple mask and oxygen- through [facility], this required 5 L. She has been taping on her mask at night but still finds it pulled off. Security officers have checked her house and under her bed but don't see him because "he curls up under the blanket". CXR 1/27/ 13 IMPRESSION:  No evidence of active pulmonary disease. Cardiac enlargement.  Original Report Authenticated By: Neale Burly, M.D.  04/27/12- 6 yoF never smoker followed for OSA, dyspnea, rhinitis, complicated by mental health issues( Dr Toy Care) and by hx right mastectomy/ breast Ca ACUTE VISIT: coughing thorugh the night that sounds raspy-wheezing, States runny nose and cough is coming from O2 at night. Also, would like to speak with CY about maybe using CPAP again. Has not pulled O2 tubing out of nose at all. She blames "stress" as the reason why she would pull her CPAP of repeatedly at night. She says that stress is now much better and she would like to try CPAP again. Noticing raspy cough especially at night over the last 2 months. Denies pain  in throat for chest. Little sputum. No fever.  03/13/13- 68 yoF never smoker followed for OSA/ failed CPAP, dyspnea, rhinitis, complicated by mental health issues( Dr Toy Care) and by hx right mastectomy/ breast Ca         Aide here from [place]. FOLLOWS FOR: sleeping well as long as she has her Rx- amitriptyline. Patient reports breathing fine without need for humidifier on her oxygen concentrator. Continuous oxygen 3 L/[facility]. She again  actively discussed her delusional character "[name]", who was never seen by others, has followed her through moves, and enters her apartment through locked doors to unplug her oxygen tubing and take it out of her nose at night, repeatedly take off her CPAP when she was still trying to wear it.  10/24/13- 76 yoF never smoker followed for OSA/ failed CPAP, dyspnea, rhinitis, complicated by mental health issues( Dr Toy Care) and by hx right mastectomy/ breast Ca         FOLLOWS FOR:Pt only uses O2 at night; not a CPAP. Breathing doing well and continues to take physical therapy. DME is [facility]. She would like overnight oximetry to see if she still needs oxygen. She never could keep CPAP on at always insisted that some on the scene person came into her a lot department and would take it off at night. Today she tells me that "[name]" has been arrested and is no longer a threat to her. She asks fill out DOT form about her OSA-done.  04/24/14- 79 yoF never smoker followed for OSA/ failed CPAP, dyspnea, rhinitis, complicated by mental health issues( Dr Toy Care) and by hx right mastectomy/ breast Ca  FOLLOWS FOR: Pt uses O2 2L at night- not the CPAP. She says that oxygen works well for her except when her delusional visitor "Tutti-frutti" tangles the oxygen line over the head of her bed at night. She indicates she feels safe and that "he just sits in the other room and watches TV all night" (she lives alone). Social services has been coming out to check on her.  Review of Systems-see HPI Constitutional:   No-   weight loss, night sweats, fevers, chills, fatigue, lassitude. HEENT:   No-  headaches, difficulty swallowing, tooth/dental problems, sore throat,       No-  sneezing, itching, ear ache, nasal congestion,  +post nasal drip,  CV:  No-   chest pain, orthopnea, PND, swelling in lower extremities, anasarca, dizziness, palpitations Resp: No- acute  shortness of breath with exertion or at rest, wearing oxygen.             No-   productive cough,  + non-productive cough,  No- coughing up of blood.              No-   change in color of mucus.  No- wheezing.   Skin: No-   rash or lesions. GI:  No-   heartburn, indigestion, abdominal pain, nausea, vomiting,  GU:  MS:  No-   joint pain or swelling.   Neuro-     nothing unusual Psych:  No- change in mood or affect. No acute depression or anxiety.  No memory loss.  Objective:  Physical Exam  General- Alert, Oriented, Affect-cheerful, Distress- none acute, bright and alert, +wheelchair,           +Obese Skin- + healing first degree burn on the back of left hand Lymphadenopathy- none Head- atraumatic            Eyes- Gross vision intact, PERRLA,  conjunctivae clear secretions            Ears- Hearing, canals-normal            Nose- Clear, no-Septal dev, mucus, polyps, erosion, perforation             Throat- Mallampati II , mucosa clear , drainage- none, tonsils- atrophic Neck- flexible , trachea midline, no stridor , thyroid nl, carotid no bruit Chest - symmetrical excursion , unlabored           Heart/CV- RRR , no murmur , no gallop  , no rub, nl s1 s2                           - JVD- none , edema +1, stasis changes- none, varices- none           Lung- clear to P&A, wheeze- none, cough- none , dullness-none, rub- none           Chest wall- right mastectomy Abd-  Br/ Gen/ Rectal- Not done, not indicated Extrem- cyanosis- none, clubbing, none, atrophy- none, strength- nl Neuro- grossly intact to observation

## 2014-04-24 NOTE — Assessment & Plan Note (Addendum)
Continues oxygen with sleep. She apparently gets restless and may sleepwalk at night-could never keep CPAP on.

## 2014-04-24 NOTE — Patient Instructions (Addendum)
We can continue O2 2L for sleep   Please call as needed

## 2014-04-26 ENCOUNTER — Ambulatory Visit: Payer: Medicare Other | Admitting: Hematology and Oncology

## 2014-04-30 ENCOUNTER — Ambulatory Visit: Payer: Medicare Other | Admitting: Cardiology

## 2014-05-07 ENCOUNTER — Telehealth: Payer: Self-pay | Admitting: Internal Medicine

## 2014-05-07 NOTE — Telephone Encounter (Signed)
Called and spoke to pt. Pt stated she feels she may need an endoscopy d/t a mild dry cough that she has occasionally, pt stated she has spoken to her PCP about this as well but they feel it does not need to be addressed. Informed pt to contact her PCP about the issue. Pt verbalized understanding and denied any further questions or concerns at this time.

## 2014-05-21 ENCOUNTER — Ambulatory Visit (INDEPENDENT_AMBULATORY_CARE_PROVIDER_SITE_OTHER): Payer: Medicare Other | Admitting: Family Medicine

## 2014-05-21 VITALS — BP 118/60 | HR 84 | Temp 97.9°F | Resp 18

## 2014-05-21 DIAGNOSIS — E785 Hyperlipidemia, unspecified: Secondary | ICD-10-CM

## 2014-05-21 DIAGNOSIS — Z859 Personal history of malignant neoplasm, unspecified: Secondary | ICD-10-CM | POA: Diagnosis not present

## 2014-05-21 DIAGNOSIS — B351 Tinea unguium: Secondary | ICD-10-CM

## 2014-05-21 DIAGNOSIS — Z8572 Personal history of non-Hodgkin lymphomas: Secondary | ICD-10-CM

## 2014-05-21 DIAGNOSIS — K5909 Other constipation: Secondary | ICD-10-CM

## 2014-05-21 DIAGNOSIS — Z853 Personal history of malignant neoplasm of breast: Secondary | ICD-10-CM | POA: Diagnosis not present

## 2014-05-21 DIAGNOSIS — I10 Essential (primary) hypertension: Secondary | ICD-10-CM

## 2014-05-21 DIAGNOSIS — L539 Erythematous condition, unspecified: Secondary | ICD-10-CM | POA: Diagnosis not present

## 2014-05-21 DIAGNOSIS — K5900 Constipation, unspecified: Secondary | ICD-10-CM

## 2014-05-21 LAB — POCT CBC
Granulocyte percent: 85.4 %G — AB (ref 37–80)
HCT, POC: 39.9 % (ref 37.7–47.9)
HEMOGLOBIN: 12.9 g/dL (ref 12.2–16.2)
Lymph, poc: 1.2 (ref 0.6–3.4)
MCH: 28.3 pg (ref 27–31.2)
MCHC: 32.3 g/dL (ref 31.8–35.4)
MCV: 87.9 fL (ref 80–97)
MID (CBC): 0.2 (ref 0–0.9)
MPV: 6.7 fL (ref 0–99.8)
POC Granulocyte: 8.1 — AB (ref 2–6.9)
POC LYMPH PERCENT: 12.53 %L (ref 10–50)
POC MID %: 2.1 %M (ref 0–12)
Platelet Count, POC: 312 10*3/uL (ref 142–424)
RBC: 4.54 M/uL (ref 4.04–5.48)
RDW, POC: 17.2 %
WBC: 9.5 10*3/uL (ref 4.6–10.2)

## 2014-05-21 MED ORDER — POLYETHYLENE GLYCOL 3350 17 GM/SCOOP PO POWD: 17.0000 g | Freq: Two times a day (BID) | ORAL | Status: AC | PRN

## 2014-05-21 NOTE — Patient Instructions (Signed)
Get some over the counter clotrimazole cream (Lotrimin) and use daily on toenail bed.  We will send you your labs.  Continue follow-up with your specialists

## 2014-05-21 NOTE — Progress Notes (Signed)
Subjective: Patient is here to have her toe recheck. She thought might be getting a little bit red and wanted me to recheck it. She also requests to get labs done on her lipids and blood pressure because of difficulty that the specialist practice had drawing her blood. She is on her blood pressure medicines and wishes to continue them as also her cholesterol medicine. She is apparently educated, has 2 college degrees. She has a degree in criminal justice. She told me "a lot of things about herself. She wants to come here periodically for health care needs. She is not having any chest pain or breathing problems right now. She worries about the return of lymphoma ever, having been told that she had a 40% chance of recurrence. She's not noticed any new nodes.   Objective: Pleasant lady in no major distress. Obese. Sitting in a wheelchair. Has a scar in her left neck with the lymphoma was removed. Neck supple without nodes. Chest clear. Heart regular without murmurs. The right large toenail. Has some debris remaining in it. This was curetted. I'm uncertain as to whether it represents fungus causing ongoing debris right done in the edge of the nailbed. The nail was not regrowing yet. There is no significant erythema of the toe even though she was worried that it is getting red. I do not believe that it really is.  Assessment: Status post toenail removal with history of redness of toe, benign in appearance Mild onychomycosis Hypertension Hyperlipidemia History of lymphoma History of breast cancer  Plan: Blood chemistries panel, CBc, lipids, and LDH since that was requested by the specialist. We'll send the patient copy of the labs. Use some Lotrimin cream on the nailbed. She has chronic constipation, and requested a refill on her MiraLAX which I did. She prefers to get prescription.  Results for orders placed or performed in visit on 05/21/14  POCT CBC  Result Value Ref Range   WBC 9.5 4.6 - 10.2  K/uL   Lymph, poc 1.2 0.6 - 3.4   POC LYMPH PERCENT 12.53 10 - 50 %L   MID (cbc) 0.2 0 - 0.9   POC MID % 2.1 0 - 12 %M   POC Granulocyte 8.1 (A) 2 - 6.9   Granulocyte percent 85.4 (A) 37 - 80 %G   RBC 4.54 4.04 - 5.48 M/uL   Hemoglobin 12.9 12.2 - 16.2 g/dL   HCT, POC 39.9 37.7 - 47.9 %   MCV 87.9 80 - 97 fL   MCH, POC 28.3 27 - 31.2 pg   MCHC 32.3 31.8 - 35.4 g/dL   RDW, POC 17.2 %   Platelet Count, POC 312 142 - 424 K/uL   MPV 6.7 0 - 99.8 fL

## 2014-05-22 ENCOUNTER — Telehealth: Payer: Self-pay | Admitting: Internal Medicine

## 2014-05-22 ENCOUNTER — Telehealth: Payer: Self-pay | Admitting: Family Medicine

## 2014-05-22 LAB — COMPREHENSIVE METABOLIC PANEL
ALT: 29 U/L (ref 0–35)
AST: 22 U/L (ref 0–37)
Albumin: 4 g/dL (ref 3.5–5.2)
Alkaline Phosphatase: 229 U/L — ABNORMAL HIGH (ref 39–117)
BUN: 15 mg/dL (ref 6–23)
CALCIUM: 9.6 mg/dL (ref 8.4–10.5)
CHLORIDE: 103 meq/L (ref 96–112)
CO2: 26 meq/L (ref 19–32)
Creat: 0.72 mg/dL (ref 0.50–1.10)
Glucose, Bld: 91 mg/dL (ref 70–99)
Potassium: 4 mEq/L (ref 3.5–5.3)
SODIUM: 140 meq/L (ref 135–145)
TOTAL PROTEIN: 6.4 g/dL (ref 6.0–8.3)
Total Bilirubin: 0.8 mg/dL (ref 0.2–1.2)

## 2014-05-22 LAB — LIPID PANEL
CHOLESTEROL: 163 mg/dL (ref 0–200)
HDL: 43 mg/dL — AB (ref >=46)
LDL CALC: 87 mg/dL (ref 0–99)
Total CHOL/HDL Ratio: 3.8 Ratio
Triglycerides: 167 mg/dL — ABNORMAL HIGH (ref <150)
VLDL: 33 mg/dL (ref 0–40)

## 2014-05-22 LAB — LACTATE DEHYDROGENASE: LDH: 238 U/L (ref 94–250)

## 2014-05-22 NOTE — Telephone Encounter (Signed)
Patient called again, this time leaving a message on the disabilities VM (specifically left message for Dr. Elder Cyphers). She states that [facility] has the best staff and that we always look professional. She states that our staff takes special time with her and that we are always so kind. Last, she states that any time someone asks her for a recommendation on a physician's office she always tells them [facility]. Just a FYI :-)

## 2014-05-22 NOTE — Telephone Encounter (Signed)
Patient called back to add to her previous message. She is a little confused. Did she have the lipid panel done as well? She thinks she had all three tests done (lipid, metabolic, and blood sugar) but she is unsure. She states that she needs to take the results to her cardiologist. Please advise.  [phone]

## 2014-05-22 NOTE — Telephone Encounter (Signed)
LMTCB

## 2014-05-22 NOTE — Telephone Encounter (Signed)
Patient states that she had a lipid panel and metabolic panel during her visit yesterday (05/21/2014). She states that she believes she is pre-diabetic and she wants her blood sugar to be included in the testing that goes to the lab. Please advise patient if this can be done with the blood that was drawn yesterday or if she needs to RTC.  [phone]

## 2014-05-23 ENCOUNTER — Encounter: Payer: Self-pay | Admitting: Family Medicine

## 2014-05-23 NOTE — Telephone Encounter (Signed)
LMTC x 1  

## 2014-05-24 ENCOUNTER — Encounter: Payer: Self-pay | Admitting: Family Medicine

## 2014-05-24 NOTE — Telephone Encounter (Signed)
Letter sent.

## 2014-05-24 NOTE — Telephone Encounter (Signed)
I have written a lab letter for her.  It is in my chart (look at previous letter).  I planned to send it tomorrow, but if you can print it off and send it that would be fine.

## 2014-05-24 NOTE — Telephone Encounter (Signed)
LMTCb x 3

## 2014-05-24 NOTE — Telephone Encounter (Signed)
Pt returned call.  Pt knows that 98% is good.  Pt states that she does not need another call back. Pt states nothing further is needed.

## 2014-05-26 ENCOUNTER — Telehealth: Payer: Self-pay

## 2014-05-26 NOTE — Telephone Encounter (Signed)
PATIENT STATES SHE SAW DR. Linna Darner ON Tuesday AND HE DID SOME LAB WORK ON HER. HE TOLD HER SHE WOULD GET THE RESULTS OF THE LAB TEST MAILED TO HER. SHE WOULD LIKE TO KNOW IF THEY HAVE BEEN SENT TO HER YET? BEST PHONE [phone] (HOME) [place]

## 2014-05-27 NOTE — Telephone Encounter (Signed)
Checked chart--labs were mailed. lmom to inform pt.

## 2014-05-31 ENCOUNTER — Telehealth: Payer: Self-pay | Admitting: *Deleted

## 2014-05-31 NOTE — Telephone Encounter (Signed)
Pt called in regards to labs ... She had some questions in regards to the results and wanted to know if you could call her ... [phone]

## 2014-06-03 ENCOUNTER — Telehealth: Payer: Self-pay | Admitting: Hematology and Oncology

## 2014-06-03 NOTE — Telephone Encounter (Signed)
Patient wants mailed a copy of her recent labs.  I think we did, but please do again

## 2014-06-03 NOTE — Telephone Encounter (Signed)
returned call and s.w. pt and confirmed appt °

## 2014-06-03 NOTE — Telephone Encounter (Signed)
Pt called. Didn't receive CBC on lab letter. Resent with CBC results on it and let her know what her levels were

## 2014-06-03 NOTE — Progress Notes (Signed)
HPI:  FU coronary artery disease. Patient is status post PCI of her LAD in May of 2011. She had a drug-eluting stent at that time. There was no significant disease in her circumflex or right coronary artery. Her LV function was normal. She has had problems with recurrent atypical chest pain. A Myoview was performed in November of 2011 and showed an EF of 72% and normal perfusion. Repeat cardiac catheterization in May 2012 showed normal LV function with a patent stent in the LAD. No obstructive disease. Chest CT in May of 2012 showed no pulmonary embolus. Since I last saw her, she denies dyspnea, chest pain, palpitations or syncope. Minimal pedal edema.  Current Outpatient Prescriptions  Medication Sig Dispense Refill  . amitriptyline (ELAVIL) 100 MG tablet Take 100 mg by mouth at bedtime.    Marland Kitchen aspirin 81 MG EC tablet Take 81 mg by mouth every morning.     Marland Kitchen atorvastatin (LIPITOR) 40 MG tablet Take 1 tablet (40 mg total) by mouth every morning. 30 tablet 1  . Calcium Carbonate-Vit D-Min (CALTRATE 600+D PLUS) 600-400 MG-UNIT per tablet Take 1 tablet by mouth every morning.     . cholecalciferol (VITAMIN D) 1000 UNITS tablet Take 1,000 Units by mouth every morning.     . Cinnamon 500 MG TABS Take 1,000 mg by mouth every morning.     . Coenzyme Q10 (CO Q 10 PO) Take 1 capsule by mouth every morning.     Marland Kitchen ibuprofen (ADVIL,MOTRIN) 200 MG tablet Take 400 mg by mouth daily as needed for moderate pain.    Marland Kitchen LORazepam (ATIVAN) 2 MG tablet Take 2 mg by mouth 2 (two) times daily.     Marland Kitchen MAGNESIUM OXIDE PO Take 2 tablets by mouth every morning.     . multivitamin-iron-minerals-folic acid (CENTRUM) chewable tablet Chew 1 tablet by mouth every morning.     . mupirocin ointment (BACTROBAN) 2 % Apply 1 application topically 3 (three) times daily. 22 g 1  . nitroGLYCERIN (NITROSTAT) 0.4 MG SL tablet Place 1 tablet (0.4 mg total) under the tongue every 5 (five) minutes as needed for chest pain. 25 tablet 1    . Omega-3 Fatty Acids (FISH OIL) 1000 MG CAPS Take 1,000 mg by mouth every morning.     . polyethylene glycol (MIRALAX / GLYCOLAX) packet Take 17 g by mouth 2 (two) times daily.    . polyethylene glycol powder (GLYCOLAX/MIRALAX) powder Take 17 g by mouth 2 (two) times daily as needed. 850 g 5  . quinapril (ACCUPRIL) 20 MG tablet Take 1 tablet (20 mg total) by mouth every morning. 30 tablet 6  . Sennosides (SENNA LAX PO) Take 2 tablets by mouth every morning.     No current facility-administered medications for this visit.     Past Medical History  Diagnosis Date  . Hyperlipidemia     takes Atorvastatin daily  . Chronic headache   . Osteoarthritis   . CAD (coronary artery disease)   . Chronic chest pain   . Constipation   . Malignant neoplasm of breast     right breast  . Hypertension     takes Quinapril daily  . Joint pain   . Low back pain   . Constipation     takes Miralax daily  . GERD (gastroesophageal reflux disease)     takes Nexium daily  . History of colon polyps   . Anxiety     takes Ativan daily  .  Panic attacks   . Depression     takes Elavil nightly  . Shortness of breath     dyspnea, uses O2 nasal  3 liters/min. at night only  . Cancer     lymphoma  . Ovarian cancer screening 11/12/2013  . Adnexal mass 01/17/2014  . Sleep apnea     , uses O2 , no CPAP    Past Surgical History  Procedure Laterality Date  . Cholecystectomy    . Right mastectomy    . Tonsillectomy    . Knee surgery Right   . US echocardiography  10/04/2006    EF 55-60%  . Heart stent  May 2011 per pt  . Portacath placement      x 2  . Inner ear surgery    . Cardiac catheterization  2012  . Port removed    . Esophagogastroduodenoscopy    . Colonoscopy    . Cataract surgery Bilateral   . Submandibular gland excision Left 05/10/2013    Procedure: EXCISION LEFT SUBMANDIBULAR NODE;  Surgeon: Jodi Marble, MD;  Location: [facility];  Service: ENT;  Laterality: Left;  . Portacath placement   06/04/13    History   Social History  . Marital Status: Divorced    Spouse Name: N/A  . Number of Children: 2  . Years of Education: N/A   Occupational History  . Unemployed    Social History Main Topics  . Smoking status: Never Smoker   . Smokeless tobacco: Never Used  . Alcohol Use: No  . Drug Use: No  . Sexual Activity: Yes    Birth Control/ Protection: Post-menopausal   Other Topics Concern  . Not on file   Social History Narrative    ROS: no fevers or chills, productive cough, hemoptysis, dysphasia, odynophagia, melena, hematochezia, dysuria, hematuria, rash, seizure activity, orthopnea, PND, pedal edema, claudication. Remaining systems are negative.  Physical Exam: Well-developed obese in no acute distress.  Skin is warm and dry.  HEENT is normal.  Neck is supple. Positive bruit Chest is clear to auscultation with normal expansion.  Cardiovascular exam is regular rate and rhythm.  Abdominal exam nontender or distended. No masses palpated. Extremities show trace edema. neuro grossly intact  ECG sinus rhythm at a rate of 91. Left ventricular hypertrophy. First-degree AV block.

## 2014-06-07 ENCOUNTER — Telehealth: Payer: Self-pay

## 2014-06-07 ENCOUNTER — Ambulatory Visit (INDEPENDENT_AMBULATORY_CARE_PROVIDER_SITE_OTHER): Payer: Medicare Other | Admitting: Cardiology

## 2014-06-07 ENCOUNTER — Encounter: Payer: Self-pay | Admitting: Cardiology

## 2014-06-07 ENCOUNTER — Encounter: Payer: Self-pay | Admitting: *Deleted

## 2014-06-07 VITALS — BP 130/80 | HR 91 | Ht 65.0 in | Wt 150.0 lb

## 2014-06-07 DIAGNOSIS — I2510 Atherosclerotic heart disease of native coronary artery without angina pectoris: Secondary | ICD-10-CM

## 2014-06-07 DIAGNOSIS — I10 Essential (primary) hypertension: Secondary | ICD-10-CM | POA: Diagnosis not present

## 2014-06-07 DIAGNOSIS — I2583 Coronary atherosclerosis due to lipid rich plaque: Secondary | ICD-10-CM | POA: Diagnosis not present

## 2014-06-07 DIAGNOSIS — R0989 Other specified symptoms and signs involving the circulatory and respiratory systems: Secondary | ICD-10-CM | POA: Diagnosis not present

## 2014-06-07 NOTE — Telephone Encounter (Signed)
Pt LM on lab VM wanting a copy of her labs. LMOM letting her know we mailed her one out on Monday.

## 2014-06-07 NOTE — Assessment & Plan Note (Signed)
Continue statin. 

## 2014-06-07 NOTE — Assessment & Plan Note (Signed)
Schedule carotid Dopplers. 

## 2014-06-07 NOTE — Patient Instructions (Signed)

## 2014-06-07 NOTE — Assessment & Plan Note (Signed)
Blood pressure controlled. Continue present medications. 

## 2014-06-07 NOTE — Assessment & Plan Note (Signed)
Continue aspirin and statin. 

## 2014-06-10 ENCOUNTER — Telehealth: Payer: Self-pay | Admitting: Cardiology

## 2014-06-10 MED ORDER — ATORVASTATIN CALCIUM 40 MG PO TABS: 40.0000 mg | ORAL_TABLET | Freq: Every morning | ORAL | Status: DC

## 2014-06-10 MED ORDER — QUINAPRIL HCL 20 MG PO TABS: 20.0000 mg | ORAL_TABLET | Freq: Every morning | ORAL | Status: DC

## 2014-06-10 NOTE — Telephone Encounter (Signed)
New Message  Pt calling about medications- quinopril and lipitor. Please call back and discuss.

## 2014-06-10 NOTE — Telephone Encounter (Signed)
Pt needed refill, she forgot at appt the other day. Refill submitted to patient's preferred pharmacy. Informed patient. Pt voiced understanding, no other stated concerns at this time.

## 2014-06-13 ENCOUNTER — Telehealth: Payer: Self-pay | Admitting: Cardiology

## 2014-06-13 ENCOUNTER — Telehealth: Payer: Self-pay

## 2014-06-13 NOTE — Telephone Encounter (Signed)
Pt LM asking for [place] to call her because she was having trouble understanding some of her labs. Tried to call. Left message on machine to call back

## 2014-06-13 NOTE — Telephone Encounter (Signed)
Unable to reach pt or leave a message  

## 2014-06-13 NOTE — Telephone Encounter (Signed)
Patient was here last week to see Dr. Stanford Breed and has questions that she forgot to ask.  Also, she is not clear about their conversation regarding "stents".

## 2014-06-13 NOTE — Telephone Encounter (Signed)
Pt called back. Had questions about what tests we ordered. Everything explained

## 2014-06-14 NOTE — Telephone Encounter (Signed)
Spoke with pt, she is under severe stress and she can not remember what her questions were. Questions regarding stents answered.

## 2014-06-17 ENCOUNTER — Telehealth: Payer: Self-pay | Admitting: Hematology and Oncology

## 2014-06-17 ENCOUNTER — Other Ambulatory Visit (HOSPITAL_BASED_OUTPATIENT_CLINIC_OR_DEPARTMENT_OTHER): Payer: Medicare Other

## 2014-06-17 ENCOUNTER — Ambulatory Visit (HOSPITAL_BASED_OUTPATIENT_CLINIC_OR_DEPARTMENT_OTHER): Payer: Medicare Other | Admitting: Hematology and Oncology

## 2014-06-17 ENCOUNTER — Encounter: Payer: Self-pay | Admitting: Hematology and Oncology

## 2014-06-17 VITALS — BP 109/50 | HR 97 | Temp 98.3°F | Resp 18

## 2014-06-17 DIAGNOSIS — Z853 Personal history of malignant neoplasm of breast: Secondary | ICD-10-CM

## 2014-06-17 DIAGNOSIS — R7989 Other specified abnormal findings of blood chemistry: Secondary | ICD-10-CM

## 2014-06-17 DIAGNOSIS — C8331 Diffuse large B-cell lymphoma, lymph nodes of head, face, and neck: Secondary | ICD-10-CM

## 2014-06-17 DIAGNOSIS — R109 Unspecified abdominal pain: Secondary | ICD-10-CM | POA: Diagnosis not present

## 2014-06-17 DIAGNOSIS — C8330 Diffuse large B-cell lymphoma, unspecified site: Secondary | ICD-10-CM

## 2014-06-17 DIAGNOSIS — R945 Abnormal results of liver function studies: Secondary | ICD-10-CM

## 2014-06-17 LAB — LACTATE DEHYDROGENASE (CC13): LDH: 191 U/L (ref 125–245)

## 2014-06-17 NOTE — Assessment & Plan Note (Signed)
I suspect this could be due to fatty liver disease or increased bone turnover. PET CT scan is negative for liver metastasis of bone metastasis. I recommend close observation only.

## 2014-06-17 NOTE — Telephone Encounter (Signed)
Gave patient appointment schedule for May. [facility] will call with ct appointment. Patient aware.

## 2014-06-17 NOTE — Assessment & Plan Note (Signed)
The patient has complete response to treatment. She has now new concern about possible cancer recurrence due to intermittent abdominal pain. I plan to order a CT scan of the chest, abdomen and pelvis for restaging. Blood work so far has been normal.

## 2014-06-17 NOTE — Assessment & Plan Note (Addendum)
She has nonspecific abdomen of pain and is concerned about cancer recurrence. In the meantime, she is taking Miralax and Senokot. If CT imaging study is unrevealing, she may need repeat colonoscopy with gastroenterologist.

## 2014-06-17 NOTE — Progress Notes (Signed)
[place] OFFICE PROGRESS NOTE  Patient Care Team: Prince Solian, MD as PCP - General (Internal Medicine)  SUMMARY OF ONCOLOGIC HISTORY: Oncology History   Lymphoma, diffuse large B cell Bone marrow aspirate and biopsy was not performed due to scheduling issues and the need to start treatment ASAP   Primary site: Lymphoid Neoplasms   Staging method: AJCC 6th Edition   Clinical: Stage III signed by Heath Lark, MD on 05/30/2013  8:37 PM   Summary: Stage III       Diffuse large B cell lymphoma   05/02/2013 Procedure She has fine-needle aspirate and biopsy of left cervical lymph node, suspicious for high-grade lymphoma   05/10/2013 Surgery Excisional lymph node biopsy confirmed diffuse large B-cell lymphoma   06/04/2013 Procedure The patient have placement of Infuse-a-Port   06/05/2013 Imaging PET scan showed disease both above and below the diaphragm, consistent with stage III   06/12/2013 - 11/28/2013 Chemotherapy The patient received 6 cycles of bendamustine with rituximab.   09/03/2013 Imaging Repeat PET CT scan show complete response to treatment.   01/08/2014 Imaging PET/CT scan showed complete response to treatment    INTERVAL HISTORY: Please see below for problem oriented charting. She returns today for further follow-up. She has not been feeling well with intermittent, nonspecific abdominal pain with change in bowel habits. She denies melena or hematochezia. She is taking laxative. She denies any recent abnormal breast examination, palpable mass, abnormal breast appearance or nipple changes She denies new lymphadenopathy.  REVIEW OF SYSTEMS:   Constitutional: Denies fevers, chills or abnormal weight loss Eyes: Denies blurriness of vision Ears, nose, mouth, throat, and face: Denies mucositis or sore throat Respiratory: Denies cough, dyspnea or wheezes Cardiovascular: Denies palpitation, chest discomfort or lower extremity swelling Skin: Denies abnormal skin  rashes Lymphatics: Denies new lymphadenopathy or easy bruising Neurological:Denies numbness, tingling or new weaknesses Behavioral/Psych: Mood is stable, no new changes  All other systems were reviewed with the patient and are negative.  I have reviewed the past medical history, past surgical history, social history and family history with the patient and they are unchanged from previous note.  ALLERGIES:  has No Known Allergies.  MEDICATIONS:  Current Outpatient Prescriptions  Medication Sig Dispense Refill  . amitriptyline (ELAVIL) 100 MG tablet Take 100 mg by mouth at bedtime.    Marland Kitchen aspirin 81 MG EC tablet Take 81 mg by mouth every morning.     Marland Kitchen atorvastatin (LIPITOR) 40 MG tablet Take 1 tablet (40 mg total) by mouth every morning. 90 tablet 3  . Calcium Carbonate-Vit D-Min (CALTRATE 600+D PLUS) 600-400 MG-UNIT per tablet Take 1 tablet by mouth every morning.     . cholecalciferol (VITAMIN D) 1000 UNITS tablet Take 1,000 Units by mouth every morning.     . Cinnamon 500 MG TABS Take 1,000 mg by mouth every morning.     Marland Kitchen ibuprofen (ADVIL,MOTRIN) 200 MG tablet Take 400 mg by mouth daily as needed for moderate pain.    Marland Kitchen LORazepam (ATIVAN) 2 MG tablet Take 2 mg by mouth 2 (two) times daily.     Marland Kitchen MAGNESIUM OXIDE PO Take 2 tablets by mouth every morning.     . multivitamin-iron-minerals-folic acid (CENTRUM) chewable tablet Chew 1 tablet by mouth every morning.     . mupirocin ointment (BACTROBAN) 2 % Apply 1 application topically 3 (three) times daily. 22 g 1  . nitroGLYCERIN (NITROSTAT) 0.4 MG SL tablet Place 1 tablet (0.4 mg total) under the tongue  every 5 (five) minutes as needed for chest pain. 25 tablet 1  . Omega-3 Fatty Acids (FISH OIL) 1000 MG CAPS Take 1,000 mg by mouth every morning.     . polyethylene glycol (MIRALAX / GLYCOLAX) packet Take 17 g by mouth 2 (two) times daily.    . polyethylene glycol powder (GLYCOLAX/MIRALAX) powder Take 17 g by mouth 2 (two) times daily as  needed. 850 g 5  . quinapril (ACCUPRIL) 20 MG tablet Take 1 tablet (20 mg total) by mouth every morning. 90 tablet 3  . Sennosides (SENNA LAX PO) Take 2 tablets by mouth every morning.     No current facility-administered medications for this visit.    PHYSICAL EXAMINATION: ECOG PERFORMANCE STATUS: 2 - Symptomatic, <50% confined to bed  Filed Vitals:   06/17/14 1049  BP: 109/50  Pulse: 97  Temp: 98.3 F (36.8 C)  Resp: 18   Filed Weights    GENERAL:alert, no distress and comfortable. She is obese. She is sitting on the wheelchair. SKIN: skin color, texture, turgor are normal, no rashes or significant lesions. The area of concern around the skin is just a skin tag. EYES: normal, Conjunctiva are pink and non-injected, sclera clear OROPHARYNX:no exudate, no erythema and lips, buccal mucosa, and tongue normal  NECK: supple, thyroid normal size, non-tender, without nodularity LYMPH:  no palpable lymphadenopathy in the cervical, axillary or inguinal LUNGS: clear to auscultation and percussion with normal breathing effort HEART: regular rate & rhythm and no murmurs and no lower extremity edema ABDOMEN:abdomen soft, non-tender and normal bowel sounds Musculoskeletal:no cyanosis of digits and no clubbing  NEURO: alert & oriented x 3 with fluent speech, no focal motor/sensory deficits Chest examination did not reveal any abnormalities apart from her prior mastectomy scar and scars from prior port placement. LABORATORY DATA:  I have reviewed the data as listed    Component Value Date/Time   NA 140 05/21/2014 1630   NA 141 01/08/2014 1503   K 4.0 05/21/2014 1630   K 4.2 01/08/2014 1503   CL 103 05/21/2014 1630   CL 107 04/05/2012 1158   CO2 26 05/21/2014 1630   CO2 23 01/08/2014 1503   GLUCOSE 91 05/21/2014 1630   GLUCOSE 91 01/08/2014 1503   GLUCOSE 144* 04/05/2012 1158   BUN 15 05/21/2014 1630   BUN 11.0 01/08/2014 1503   CREATININE 0.72 05/21/2014 1630   CREATININE 0.80  04/18/2014 1704   CREATININE 0.7 01/08/2014 1503   CALCIUM 9.6 05/21/2014 1630   CALCIUM 9.3 01/08/2014 1503   PROT 6.4 05/21/2014 1630   PROT 6.4 01/08/2014 1503   ALBUMIN 4.0 05/21/2014 1630   ALBUMIN 3.4* 01/08/2014 1503   AST 22 05/21/2014 1630   AST 29 01/08/2014 1503   ALT 29 05/21/2014 1630   ALT 35 01/08/2014 1503   ALKPHOS 229* 05/21/2014 1630   ALKPHOS 211* 01/08/2014 1503   BILITOT 0.8 05/21/2014 1630   BILITOT 0.59 01/08/2014 1503   GFRNONAA 82* 06/06/2013 0830   GFRAA >90 06/06/2013 0830    No results found for: SPEP, UPEP  Lab Results  Component Value Date   WBC 9.5 05/21/2014   NEUTROABS 7.3 01/30/2014   HGB 12.9 05/21/2014   HCT 39.9 05/21/2014   MCV 87.9 05/21/2014   PLT 218 01/30/2014      Chemistry      Component Value Date/Time   NA 140 05/21/2014 1630   NA 141 01/08/2014 1503   K 4.0 05/21/2014 1630   K 4.2  01/08/2014 1503   CL 103 05/21/2014 1630   CL 107 04/05/2012 1158   CO2 26 05/21/2014 1630   CO2 23 01/08/2014 1503   BUN 15 05/21/2014 1630   BUN 11.0 01/08/2014 1503   CREATININE 0.72 05/21/2014 1630   CREATININE 0.80 04/18/2014 1704   CREATININE 0.7 01/08/2014 1503      Component Value Date/Time   CALCIUM 9.6 05/21/2014 1630   CALCIUM 9.3 01/08/2014 1503   ALKPHOS 229* 05/21/2014 1630   ALKPHOS 211* 01/08/2014 1503   AST 22 05/21/2014 1630   AST 29 01/08/2014 1503   ALT 29 05/21/2014 1630   ALT 35 01/08/2014 1503   BILITOT 0.8 05/21/2014 1630   BILITOT 0.59 01/08/2014 1503     ASSESSMENT & PLAN:  Diffuse large B cell lymphoma The patient has complete response to treatment. She has now new concern about possible cancer recurrence due to intermittent abdominal pain. I plan to order a CT scan of the chest, abdomen and pelvis for restaging. Blood work so far has been normal.   Elevated liver function tests I suspect this could be due to fatty liver disease or increased bone turnover. PET CT scan is negative for liver  metastasis of bone metastasis. I recommend close observation only.   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 Continue close follow-up.   Abdominal pain She has nonspecific abdomen of pain and is concerned about cancer recurrence. In the meantime, she is taking Miralax and Senokot. If CT imaging study is unrevealing, she may need repeat colonoscopy with gastroenterologist.    Orders Placed This Encounter  Procedures  . CT Chest W Contrast    Standing Status: Future     Number of Occurrences:      Standing Expiration Date: 08/17/2015    Order Specific Question:  Reason for Exam (SYMPTOM  OR DIAGNOSIS REQUIRED)    Answer:  staging lymphoma, exclude recurrence, abdominal pain    Order Specific Question:  Preferred imaging location?    Answer:  [facility]  . CT Abdomen Pelvis W Contrast    Standing Status: Future     Number of Occurrences:      Standing Expiration Date: 09/17/2015    Order Specific Question:  Reason for Exam (SYMPTOM  OR DIAGNOSIS REQUIRED)    Answer:  staging lymphoma, exclude recurrence, abdominal pain    Order Specific Question:  Preferred imaging location?    Answer:  [facility]   All questions were answered. The patient knows to call the clinic with any problems, questions or concerns. No barriers to learning was detected. I spent 25 minutes counseling the patient face to face. The total time spent in the appointment was 30 minutes and more than 50% was on counseling and review of test results     [facility], [place], MD 06/17/2014 4:06 PM

## 2014-06-17 NOTE — Assessment & Plan Note (Signed)
Physically, she has no signs of recurrence. Continue close follow-up.

## 2014-06-18 ENCOUNTER — Telehealth: Payer: Self-pay | Admitting: *Deleted

## 2014-06-18 NOTE — Telephone Encounter (Signed)
[name] called to say she forgot to tell Dr Alvy Bimler 3 things.  Black spot under breast has been seen by a dermatologist- nothing to worry about. Cardiologist has ordered Carotid artery [place] to be done on 5/16.  Stomach continues to hurt.Marland Kitchen

## 2014-06-18 NOTE — Telephone Encounter (Signed)
Tell her I'm ordering CT scan to check on the stomach    She can take extra strength tylenol for pain now   Pt notified of message above from Dr Alvy Bimler

## 2014-06-21 ENCOUNTER — Telehealth: Payer: Self-pay | Admitting: *Deleted

## 2014-06-21 NOTE — Telephone Encounter (Signed)
Call back to patient and explained to her again that the skin area she is wondering about is just a skin tag, Dr. Alvy Bimler noted it on her exam as did a dermatologist. Per their reports, there is nothing for patient to be concerned about. Patient expressed her gratitude for the call back. No other issues identified.

## 2014-06-21 NOTE — Telephone Encounter (Signed)
I have not heard about it My last exam showed it's just a skin tag

## 2014-06-21 NOTE — Telephone Encounter (Signed)
TC from patient in regards to an area of skin under her  Right breast. She wants to know if her surgeon, Dr. Caren Macadam, had called Dr. Alvy Bimler about it and if there was anything of concern he said to her.. Pt. Requesting a call back.

## 2014-06-24 ENCOUNTER — Encounter (HOSPITAL_COMMUNITY): Payer: Medicare Other

## 2014-06-27 ENCOUNTER — Encounter (HOSPITAL_COMMUNITY): Payer: Medicare Other

## 2014-07-01 ENCOUNTER — Telehealth: Payer: Self-pay | Admitting: *Deleted

## 2014-07-01 ENCOUNTER — Ambulatory Visit (HOSPITAL_COMMUNITY)
Admission: RE | Admit: 2014-07-01 | Discharge: 2014-07-01 | Disposition: A | Discharge status: Home / Self Care | Payer: Medicare Other | Source: Ambulatory Visit | Attending: Cardiology | Admitting: Cardiology

## 2014-07-01 DIAGNOSIS — R0989 Other specified symptoms and signs involving the circulatory and respiratory systems: Secondary | ICD-10-CM | POA: Insufficient documentation

## 2014-07-01 NOTE — Telephone Encounter (Signed)
Pt called and left message re:  Pt unable to do CT scan this week  07/03/14, and had rescheduled for 07/10/14 at 3 PM.  Pt stated she has had pain from her abdomen and could not keep her appts.  Pt would like to know when she could reschedule office visit appt with Dr. Alvy Bimler after her scan on 07/10/14. Pt's  Phone    [phone].

## 2014-07-01 NOTE — Telephone Encounter (Signed)
Spoke with pt and informed pt re: Per Dr. Alvy Bimler, md will review results of CT scans - will be done 07/10/14, then pt will be given appt for office visit to discuss scan results with md.   Pt voiced understanding.

## 2014-07-01 NOTE — Progress Notes (Signed)
Carotid Duplex Completed. Preliminary results by tech - Right ICA 50-69% stenosis and left ICA 1-49% diameter reduction.  Oda Cogan, BS, RDMS, RVT

## 2014-07-01 NOTE — Telephone Encounter (Signed)
She has too many cancellations in the past I will wait until after Ct is done before I reschedule an appt to see her back

## 2014-07-02 ENCOUNTER — Telehealth: Payer: Self-pay | Admitting: Hematology and Oncology

## 2014-07-02 ENCOUNTER — Other Ambulatory Visit: Payer: Self-pay | Admitting: *Deleted

## 2014-07-02 NOTE — Telephone Encounter (Signed)
s.w. pt and advised on added labs on 5.25.Marland KitchenMarland Kitchenpt ok and aware

## 2014-07-03 ENCOUNTER — Ambulatory Visit (HOSPITAL_COMMUNITY): Payer: Medicare Other

## 2014-07-03 ENCOUNTER — Telehealth: Payer: Self-pay | Admitting: Cardiology

## 2014-07-03 NOTE — Telephone Encounter (Signed)
I did not need this encounter. °

## 2014-07-05 ENCOUNTER — Ambulatory Visit: Payer: Medicare Other | Admitting: Hematology and Oncology

## 2014-07-09 ENCOUNTER — Encounter: Payer: Self-pay | Admitting: *Deleted

## 2014-07-09 DIAGNOSIS — C8330 Diffuse large B-cell lymphoma, unspecified site: Secondary | ICD-10-CM

## 2014-07-10 ENCOUNTER — Encounter (HOSPITAL_COMMUNITY): Payer: Self-pay

## 2014-07-10 ENCOUNTER — Other Ambulatory Visit (HOSPITAL_BASED_OUTPATIENT_CLINIC_OR_DEPARTMENT_OTHER): Payer: Medicare Other

## 2014-07-10 ENCOUNTER — Ambulatory Visit (HOSPITAL_COMMUNITY)
Admission: RE | Admit: 2014-07-10 | Discharge: 2014-07-10 | Disposition: A | Discharge status: Home / Self Care | Payer: Medicare Other | Source: Ambulatory Visit | Attending: Hematology and Oncology | Admitting: Hematology and Oncology

## 2014-07-10 DIAGNOSIS — C8331 Diffuse large B-cell lymphoma, lymph nodes of head, face, and neck: Secondary | ICD-10-CM

## 2014-07-10 DIAGNOSIS — Z853 Personal history of malignant neoplasm of breast: Secondary | ICD-10-CM | POA: Diagnosis not present

## 2014-07-10 DIAGNOSIS — C8330 Diffuse large B-cell lymphoma, unspecified site: Secondary | ICD-10-CM | POA: Diagnosis not present

## 2014-07-10 DIAGNOSIS — R109 Unspecified abdominal pain: Secondary | ICD-10-CM | POA: Diagnosis not present

## 2014-07-10 DIAGNOSIS — R0602 Shortness of breath: Secondary | ICD-10-CM | POA: Insufficient documentation

## 2014-07-10 DIAGNOSIS — R945 Abnormal results of liver function studies: Secondary | ICD-10-CM

## 2014-07-10 LAB — COMPREHENSIVE METABOLIC PANEL (CC13)
ALT: 33 U/L (ref 0–55)
AST: 23 U/L (ref 5–34)
Albumin: 3.5 g/dL (ref 3.5–5.0)
Alkaline Phosphatase: 242 U/L — ABNORMAL HIGH (ref 40–150)
Anion Gap: 12 mEq/L — ABNORMAL HIGH (ref 3–11)
BUN: 14.1 mg/dL (ref 7.0–26.0)
CO2: 25 meq/L (ref 22–29)
Calcium: 9.3 mg/dL (ref 8.4–10.4)
Chloride: 104 mEq/L (ref 98–109)
Creatinine: 0.8 mg/dL (ref 0.6–1.1)
EGFR: 71 mL/min/{1.73_m2} — ABNORMAL LOW (ref >90)
Glucose: 121 mg/dl (ref 70–140)
Potassium: 4.4 mEq/L (ref 3.5–5.1)
Sodium: 141 mEq/L (ref 136–145)
TOTAL PROTEIN: 6.5 g/dL (ref 6.4–8.3)
Total Bilirubin: 0.56 mg/dL (ref 0.20–1.20)

## 2014-07-10 LAB — CBC WITH DIFFERENTIAL/PLATELET
BASO%: 0.2 % (ref 0.0–2.0)
BASOS ABS: 0 10*3/uL (ref 0.0–0.1)
EOS%: 2.2 % (ref 0.0–7.0)
Eosinophils Absolute: 0.2 10*3/uL (ref 0.0–0.5)
HCT: 38.2 % (ref 34.8–46.6)
HGB: 12.5 g/dL (ref 11.6–15.9)
LYMPH%: 9.2 % — AB (ref 14.0–49.7)
MCH: 28.8 pg (ref 25.1–34.0)
MCHC: 32.8 g/dL (ref 31.5–36.0)
MCV: 87.8 fL (ref 79.5–101.0)
MONO#: 0.6 10*3/uL (ref 0.1–0.9)
MONO%: 7.7 % (ref 0.0–14.0)
NEUT#: 6.8 10*3/uL — ABNORMAL HIGH (ref 1.5–6.5)
NEUT%: 80.7 % — ABNORMAL HIGH (ref 38.4–76.8)
PLATELETS: 255 10*3/uL (ref 145–400)
RBC: 4.36 10*6/uL (ref 3.70–5.45)
RDW: 16.7 % — AB (ref 11.2–14.5)
WBC: 8.4 10*3/uL (ref 3.9–10.3)
lymph#: 0.8 10*3/uL — ABNORMAL LOW (ref 0.9–3.3)

## 2014-07-10 MED ORDER — IOHEXOL 300 MG/ML  SOLN
100.0000 mL | Freq: Once | INTRAMUSCULAR | Status: AC | PRN
  Administered 2014-07-10: 100 mL via INTRAVENOUS

## 2014-07-16 ENCOUNTER — Other Ambulatory Visit: Payer: Self-pay | Admitting: *Deleted

## 2014-07-16 ENCOUNTER — Telehealth: Payer: Self-pay | Admitting: *Deleted

## 2014-07-16 NOTE — Telephone Encounter (Signed)
Great. Put her in at 1115 am, tell her to come in 1045 am Please put POF

## 2014-07-16 NOTE — Telephone Encounter (Signed)
Left VM for pt informing of appt on 6/8 to come at 10:45 am to see Dr. Alvy Bimler.  Asked pt to call back if any problems or questions.

## 2014-07-16 NOTE — Telephone Encounter (Signed)
-----   Message from Heath Lark, MD sent at 07/15/2014  9:04 PM EDT ----- Regarding: CT result PLs let her know we need to set up appt on 6/8 to review CT results (pls ask when she can come in)

## 2014-07-16 NOTE — Telephone Encounter (Signed)
Pt states she can arrange transportation for 6/8.  She would like to come around 11 am if possible.

## 2014-07-17 ENCOUNTER — Telehealth: Payer: Self-pay | Admitting: Hematology and Oncology

## 2014-07-17 NOTE — Telephone Encounter (Signed)
Added appt per pof...per orders pof pt aware °

## 2014-07-18 ENCOUNTER — Telehealth: Payer: Self-pay | Admitting: *Deleted

## 2014-07-18 NOTE — Telephone Encounter (Signed)
VM message from patient requesting a copy of her lastest lab work be mailed to her at her home address.

## 2014-07-19 ENCOUNTER — Telehealth: Payer: Self-pay | Admitting: Internal Medicine

## 2014-07-19 NOTE — Telephone Encounter (Signed)
Suggest she do the best she can over the weekend.

## 2014-07-19 NOTE — Telephone Encounter (Signed)
We know her oxygen level drops when she is asleep. Low oxygen levels put her at increased risk of stroke and heart attack. We try to protect her as well as we can by prescribing the oxygen.

## 2014-07-19 NOTE — Telephone Encounter (Signed)
lmtcb

## 2014-07-19 NOTE — Telephone Encounter (Signed)
Spoke with pt. States that she did not use her oxygen last night. Wants to know how detrimental this would be to her health. Advised her that I was unsure but would speak with CY.  CY - please advise. Thanks,

## 2014-07-19 NOTE — Telephone Encounter (Signed)
Patient says that she saw Dr. Annamaria Boots in March and at that time "Tuti Fruiti" took the tubing and tied it high on the bed post and she could not get it down, she had to call EMS to come and get the tubing down.  Patient says that Belk stays under her bed until she goes to bed, then he will says that "Tuti Fruiti" watches her TV all night long, when she walks down the hallway and he will run out the door.  She said that she cannot lock the door because he will pick-lock the door to get in.  Patient says that she has tried to get rid of "Tuti Fruiti" for 5 years. She says that she has called the police several times and they told her that they cannot do anything unless she can get a picture of him or if someone else can see him.  She said that they treat her like she is delusional.  She said she tried to get a dog, "tuti fruiti" had the dog taken to the pound and left her a note telling her that he sent her dog away.     Patient notified of Dr. Janee Morn recommendations.

## 2014-07-19 NOTE — Telephone Encounter (Signed)
Spoke with patient, states that she feels that she is not going to be able to use the O2 because "Tuti Fruiti" is not going let her. Pt states that Inda [place] Fruti comes into her bedroom at night still and is pulling her O2 out of her nose. Pt became very emotional and was crying stating that she does not know what to do to keep him from taking it off. Pt states that she has tried taping the O2 cannula onto her head/face to prevent it from being pulled out at night, sometimes it works and sometimes it doesn't. Pt states that she is going to do that tonight - I advised against it stating that she can have a reaction to the tape or it can tear her skin up and she stated that she does it all the time. Wants to know what rec's Dr Annamaria Boots can offer her.  Please advise Dr Annamaria Boots. Thanks.

## 2014-07-22 ENCOUNTER — Telehealth: Payer: Self-pay | Admitting: Internal Medicine

## 2014-07-22 ENCOUNTER — Telehealth: Payer: Self-pay | Admitting: Hematology and Oncology

## 2014-07-22 ENCOUNTER — Encounter: Payer: Self-pay | Admitting: *Deleted

## 2014-07-22 NOTE — Telephone Encounter (Signed)
Pt has another question for nurse.Hillery Hunter

## 2014-07-22 NOTE — Telephone Encounter (Signed)
pt called to r/s appt...done...says she has a critical event that she can not come to appt. pt aware of new d.t

## 2014-07-22 NOTE — Telephone Encounter (Signed)
ATC pt NA wcb 

## 2014-07-22 NOTE — Progress Notes (Signed)
Lab work from 5/25 mailed to pt's home address per pt request.

## 2014-07-22 NOTE — Telephone Encounter (Signed)
Please let her know that it is ok for her to use her oxygen at home during the day if she feels short of breath.

## 2014-07-22 NOTE — Telephone Encounter (Signed)
ATC NA WCB  Will send to [facility] as the patient has refused to speak with any of the triage nurses today.

## 2014-07-22 NOTE — Telephone Encounter (Signed)
I spoke with patient-she wanted to let me know she has things lined up to see about getting into [facility] soon for little bit of rehab and her PCP is Dr Harrington Challenger at [place]. Nothing more needed.

## 2014-07-22 NOTE — Telephone Encounter (Signed)
Pt is aware to use her O2 during the day as well if needed for SOB. Pt states she has no family support and "Tutti-Frutti" keeps bothering her with O2 usage. Pt then stated she felt she needed to be somewhere someone could help her. She is not feeling like a danger to herself or anyone else-just needs help with caring for herself. Pt is aware to speak with her PCP to get recs of where to be placed based on insurance. CY is aware and agrees to have patient start with her PCP(Dr Avva).

## 2014-07-22 NOTE — Telephone Encounter (Signed)
Pt is wanting to know if she needs to be using her O2 during the daytime Pt refused to give any more information as to why she feels that she needs O2 during the daytime. Pt was very hateful and stated that she she was not going to give me anymore information and that Dr Annamaria Boots know her situation and I do not need to know any more than what she has told me. I advised her that for Dr Annamaria Boots to address this request accordingly that I need more information about why she feels she needs to be using her O2 during the daytime - pt refused again stating that she's not telling me anything and that she is not discussing it at this time. Patient ended the call after this statement. Patient called in earlier and refused to speak to another nurse and hung up several times on the triage nurse. Please advise Dr Annamaria Boots. Thanks.

## 2014-07-22 NOTE — Telephone Encounter (Signed)
Called pt asking what I can help her with multiple times. Pt states I can't help her. Keeps stating that only Sharyn Lull can help her because she is the last one that spoke with her on Friday and keeps hanging up on me every time I have called.

## 2014-07-23 ENCOUNTER — Telehealth: Payer: Self-pay | Admitting: Internal Medicine

## 2014-07-23 NOTE — Telephone Encounter (Signed)
ATC received busy line signal. wcb

## 2014-07-24 ENCOUNTER — Ambulatory Visit: Payer: Medicare Other | Admitting: Hematology and Oncology

## 2014-07-24 NOTE — Telephone Encounter (Signed)
Spoke with pt, is wanting to know if she can use 3lpm 02 qhs instead of her prescribed 2lpm 02.  Pt states that "someone" keeps pulling off her nasal cannula at night and she wants to make sure she is getting adequate 02 while asleep.  CY please advise.  Thanks!  No Known Allergies Current Outpatient Prescriptions on File Prior to Visit  Medication Sig Dispense Refill  . amitriptyline (ELAVIL) 100 MG tablet Take 100 mg by mouth at bedtime.    Marland Kitchen aspirin 81 MG EC tablet Take 81 mg by mouth every morning.     Marland Kitchen atorvastatin (LIPITOR) 40 MG tablet Take 1 tablet (40 mg total) by mouth every morning. 90 tablet 3  . Calcium Carbonate-Vit D-Min (CALTRATE 600+D PLUS) 600-400 MG-UNIT per tablet Take 1 tablet by mouth every morning.     . cholecalciferol (VITAMIN D) 1000 UNITS tablet Take 1,000 Units by mouth every morning.     . Cinnamon 500 MG TABS Take 1,000 mg by mouth every morning.     Marland Kitchen ibuprofen (ADVIL,MOTRIN) 200 MG tablet Take 400 mg by mouth daily as needed for moderate pain.    Marland Kitchen LORazepam (ATIVAN) 2 MG tablet Take 2 mg by mouth 2 (two) times daily.     Marland Kitchen MAGNESIUM OXIDE PO Take 2 tablets by mouth every morning.     . multivitamin-iron-minerals-folic acid (CENTRUM) chewable tablet Chew 1 tablet by mouth every morning.     . mupirocin ointment (BACTROBAN) 2 % Apply 1 application topically 3 (three) times daily. 22 g 1  . nitroGLYCERIN (NITROSTAT) 0.4 MG SL tablet Place 1 tablet (0.4 mg total) under the tongue every 5 (five) minutes as needed for chest pain. 25 tablet 1  . Omega-3 Fatty Acids (FISH OIL) 1000 MG CAPS Take 1,000 mg by mouth every morning.     . polyethylene glycol (MIRALAX / GLYCOLAX) packet Take 17 g by mouth 2 (two) times daily.    . polyethylene glycol powder (GLYCOLAX/MIRALAX) powder Take 17 g by mouth 2 (two) times daily as needed. 850 g 5  . quinapril (ACCUPRIL) 20 MG tablet Take 1 tablet (20 mg total) by mouth every morning. 90 tablet 3  . Sennosides (SENNA LAX PO) Take 2  tablets by mouth every morning.     No current facility-administered medications on file prior to visit.

## 2014-07-24 NOTE — Telephone Encounter (Signed)
lmtcb

## 2014-07-24 NOTE — Telephone Encounter (Signed)
She is better off staying on 2L O2 while asleep. 3 L might be too much for her.

## 2014-07-25 NOTE — Telephone Encounter (Signed)
Attempted to call pt. Line was busy. Will try back. 

## 2014-07-25 NOTE — Telephone Encounter (Signed)
Pt returning call.[name] ° °

## 2014-07-25 NOTE — Telephone Encounter (Signed)
Spoke with pt. She is not satisfied with CY's recommendation. Reports that someone is pulling her oxygen tubing off at night. She is afraid that if she doesn't get enough oxygen that she will have a stroke or heart attack. States, "Obviously this person doesn't want me to live, that's why he's pulling my tubing off." Pt would like CY to address this situation again.  CY - please advise. Thanks.

## 2014-07-25 NOTE — Telephone Encounter (Signed)
[place] for DME to set her home O2 for sleep at 3L

## 2014-07-25 NOTE — Telephone Encounter (Signed)
[phone] returning call

## 2014-07-25 NOTE — Telephone Encounter (Signed)
Attempted to call pt. No answer, no option to leave a message. Will try back. 

## 2014-07-25 NOTE — Telephone Encounter (Signed)
Attempted to call pt again. Line rang busy. Will try back.

## 2014-07-26 NOTE — Telephone Encounter (Signed)
Left message for patient to call back  

## 2014-07-30 ENCOUNTER — Telehealth: Payer: Self-pay | Admitting: *Deleted

## 2014-07-30 NOTE — Telephone Encounter (Signed)
Patient called with questions about lab results.  "There are little r's beside the results.  What do the r's mean?  What is the Anion Gap, what is the EGFR?" Informed her the normal reference ranges for the labs are on the left side of the results and the reference range for her most recent results are on the right side.  We want to ensure she is in normal range and also look for any extreme changes in her normal range.  EGFR is another test which tells [place] moe about kidney function and the anion gap measures metabolic processes.  Thanked me for explaining and being kind and patient.

## 2014-08-14 ENCOUNTER — Telehealth: Payer: Self-pay | Admitting: *Deleted

## 2014-08-14 ENCOUNTER — Telehealth: Payer: Self-pay | Admitting: Hematology and Oncology

## 2014-08-14 NOTE — Telephone Encounter (Signed)
VM message received from patient @ 9:59 am.. TC back to patient. She wants to know the time of her appt with Dr. Alvy Bimler on 08/16/14. Pt's appt is at 12 noon.  Pt verbalized understanding. No other issues identified.

## 2014-08-14 NOTE — Telephone Encounter (Signed)
s.w. pt and confirmed appt....pt ok and aware °

## 2014-08-15 ENCOUNTER — Telehealth: Payer: Self-pay | Admitting: Cardiology

## 2014-08-15 NOTE — Telephone Encounter (Signed)
Pt says she does not want to talk to Debra,she wants to talk to a triage nurse.She did not want to tell me what this was about,she only wanted to talk to the triage nurse.

## 2014-08-15 NOTE — Telephone Encounter (Signed)
Pt had general questions about nutrition, health management - these were addressed to her satisfaction.

## 2014-08-16 ENCOUNTER — Ambulatory Visit (HOSPITAL_BASED_OUTPATIENT_CLINIC_OR_DEPARTMENT_OTHER): Payer: Medicare Other | Admitting: Hematology and Oncology

## 2014-08-16 ENCOUNTER — Telehealth: Payer: Self-pay | Admitting: Hematology and Oncology

## 2014-08-16 ENCOUNTER — Encounter: Payer: Self-pay | Admitting: Hematology and Oncology

## 2014-08-16 VITALS — BP 109/57 | HR 98 | Temp 98.1°F | Resp 18 | Ht 65.0 in

## 2014-08-16 DIAGNOSIS — R1084 Generalized abdominal pain: Secondary | ICD-10-CM | POA: Diagnosis not present

## 2014-08-16 DIAGNOSIS — C8330 Diffuse large B-cell lymphoma, unspecified site: Secondary | ICD-10-CM | POA: Diagnosis not present

## 2014-08-16 NOTE — Assessment & Plan Note (Signed)
She has chronic constipation dependent on Miralax. She also had abnormal bowel wall thickening at the terminal ileum region. I recommend GI evaluation for colonoscopy

## 2014-08-16 NOTE — Assessment & Plan Note (Signed)
The patient has complete response to treatment. Blood work so far has been normal. Recent CT scan in May 2016 show no evidence of disease. There is no role for routine imaging. I will follow her with history, clinical exam at the end of the year.

## 2014-08-16 NOTE — Progress Notes (Signed)
[facility] OFFICE PROGRESS NOTE  Patient Care Team: Prince Solian, MD as PCP - General (Internal Medicine)  SUMMARY OF ONCOLOGIC HISTORY: Oncology History   Lymphoma, diffuse large B cell Bone marrow aspirate and biopsy was not performed due to scheduling issues and the need to start treatment ASAP   Primary site: Lymphoid Neoplasms   Staging method: AJCC 6th Edition   Clinical: Stage III signed by Heath Lark, MD on 05/30/2013  8:37 PM   Summary: Stage III       Diffuse large B cell lymphoma   05/02/2013 Procedure She has fine-needle aspirate and biopsy of left cervical lymph node, suspicious for high-grade lymphoma   05/10/2013 Surgery Excisional lymph node biopsy confirmed diffuse large B-cell lymphoma   06/04/2013 Procedure The patient have placement of Infuse-a-Port   06/05/2013 Imaging PET scan showed disease both above and below the diaphragm, consistent with stage III   06/12/2013 - 11/28/2013 Chemotherapy The patient received 6 cycles of bendamustine with rituximab.   09/03/2013 Imaging Repeat PET CT scan show complete response to treatment.   01/08/2014 Imaging PET/CT scan showed complete response to treatment   07/10/2014 Imaging CT scan of the chest, abdomen and pelvis show thickening in the terminal ileum region. She has a large adnexal mass which is unchanged.    INTERVAL HISTORY: Please see below for problem oriented charting. She returns to review her test result. She has chronic constipation dependent on Miralax.  REVIEW OF SYSTEMS:   Constitutional: Denies fevers, chills or abnormal weight loss Eyes: Denies blurriness of vision Ears, nose, mouth, throat, and face: Denies mucositis or sore throat Respiratory: Denies cough, dyspnea or wheezes Cardiovascular: Denies palpitation, chest discomfort or lower extremity swelling Skin: Denies abnormal skin rashes Lymphatics: Denies new lymphadenopathy or easy bruising Neurological:Denies numbness, tingling or  new weaknesses Behavioral/Psych: Mood is stable, no new changes  All other systems were reviewed with the patient and are negative.  I have reviewed the past medical history, past surgical history, social history and family history with the patient and they are unchanged from previous note.  ALLERGIES:  has No Known Allergies.  MEDICATIONS:  Current Outpatient Prescriptions  Medication Sig Dispense Refill  . amitriptyline (ELAVIL) 100 MG tablet Take 100 mg by mouth at bedtime.    Marland Kitchen aspirin 81 MG EC tablet Take 81 mg by mouth every morning.     Marland Kitchen atorvastatin (LIPITOR) 40 MG tablet Take 1 tablet (40 mg total) by mouth every morning. 90 tablet 3  . Calcium Carbonate-Vit D-Min (CALTRATE 600+D PLUS) 600-400 MG-UNIT per tablet Take 1 tablet by mouth every morning.     . cholecalciferol (VITAMIN D) 1000 UNITS tablet Take 1,000 Units by mouth every morning.     . Cinnamon 500 MG TABS Take 1,000 mg by mouth every morning.     Marland Kitchen ibuprofen (ADVIL,MOTRIN) 200 MG tablet Take 400 mg by mouth daily as needed for moderate pain.    Marland Kitchen LORazepam (ATIVAN) 2 MG tablet Take 2 mg by mouth 2 (two) times daily.     Marland Kitchen MAGNESIUM OXIDE PO Take 2 tablets by mouth every morning.     . multivitamin-iron-minerals-folic acid (CENTRUM) chewable tablet Chew 1 tablet by mouth every morning.     . nitroGLYCERIN (NITROSTAT) 0.4 MG SL tablet Place 1 tablet (0.4 mg total) under the tongue every 5 (five) minutes as needed for chest pain. 25 tablet 1  . Omega-3 Fatty Acids (FISH OIL) 1000 MG CAPS Take 1,000 mg by  mouth every morning.     . polyethylene glycol (MIRALAX / GLYCOLAX) packet Take 17 g by mouth 2 (two) times daily.    . polyethylene glycol powder (GLYCOLAX/MIRALAX) powder Take 17 g by mouth 2 (two) times daily as needed. 850 g 5  . quinapril (ACCUPRIL) 20 MG tablet Take 1 tablet (20 mg total) by mouth every morning. 90 tablet 3  . Sennosides (SENNA LAX PO) Take 2 tablets by mouth every morning.     No current  facility-administered medications for this visit.    PHYSICAL EXAMINATION: ECOG PERFORMANCE STATUS: 2 - Symptomatic, <50% confined to bed  Filed Vitals:   08/16/14 1157  BP: 109/57  Pulse: 98  Temp: 98.1 F (36.7 C)  Resp: 18   Filed Weights    GENERAL:alert, no distress and comfortable SKIN: skin color, texture, turgor are normal, no rashes or significant lesions EYES: normal, Conjunctiva are pink and non-injected, sclera clear Musculoskeletal:no cyanosis of digits and no clubbing  NEURO: alert & oriented x 3 with fluent speech, no focal motor/sensory deficits  LABORATORY DATA:  I have reviewed the data as listed    Component Value Date/Time   NA 141 07/10/2014 1418   NA 140 05/21/2014 1630   K 4.4 07/10/2014 1418   K 4.0 05/21/2014 1630   CL 103 05/21/2014 1630   CL 107 04/05/2012 1158   CO2 25 07/10/2014 1418   CO2 26 05/21/2014 1630   GLUCOSE 121 07/10/2014 1418   GLUCOSE 91 05/21/2014 1630   GLUCOSE 144* 04/05/2012 1158   BUN 14.1 07/10/2014 1418   BUN 15 05/21/2014 1630   CREATININE 0.8 07/10/2014 1418   CREATININE 0.72 05/21/2014 1630   CREATININE 0.80 04/18/2014 1704   CALCIUM 9.3 07/10/2014 1418   CALCIUM 9.6 05/21/2014 1630   PROT 6.5 07/10/2014 1418   PROT 6.4 05/21/2014 1630   ALBUMIN 3.5 07/10/2014 1418   ALBUMIN 4.0 05/21/2014 1630   AST 23 07/10/2014 1418   AST 22 05/21/2014 1630   ALT 33 07/10/2014 1418   ALT 29 05/21/2014 1630   ALKPHOS 242* 07/10/2014 1418   ALKPHOS 229* 05/21/2014 1630   BILITOT 0.56 07/10/2014 1418   BILITOT 0.8 05/21/2014 1630   GFRNONAA 82* 06/06/2013 0830   GFRAA >90 06/06/2013 0830    No results found for: SPEP, UPEP  Lab Results  Component Value Date   WBC 8.4 07/10/2014   NEUTROABS 6.8* 07/10/2014   HGB 12.5 07/10/2014   HCT 38.2 07/10/2014   MCV 87.8 07/10/2014   PLT 255 07/10/2014      Chemistry      Component Value Date/Time   NA 141 07/10/2014 1418   NA 140 05/21/2014 1630   K 4.4 07/10/2014  1418   K 4.0 05/21/2014 1630   CL 103 05/21/2014 1630   CL 107 04/05/2012 1158   CO2 25 07/10/2014 1418   CO2 26 05/21/2014 1630   BUN 14.1 07/10/2014 1418   BUN 15 05/21/2014 1630   CREATININE 0.8 07/10/2014 1418   CREATININE 0.72 05/21/2014 1630   CREATININE 0.80 04/18/2014 1704      Component Value Date/Time   CALCIUM 9.3 07/10/2014 1418   CALCIUM 9.6 05/21/2014 1630   ALKPHOS 242* 07/10/2014 1418   ALKPHOS 229* 05/21/2014 1630   AST 23 07/10/2014 1418   AST 22 05/21/2014 1630   ALT 33 07/10/2014 1418   ALT 29 05/21/2014 1630   BILITOT 0.56 07/10/2014 1418   BILITOT 0.8 05/21/2014 1630  RADIOGRAPHIC STUDIES: I reviewed the CT scan with the patient I have personally reviewed the radiological images as listed and agreed with the findings in the report.   ASSESSMENT & PLAN:  Diffuse large B cell lymphoma The patient has complete response to treatment. Blood work so far has been normal. Recent CT scan in May 2016 show no evidence of disease. There is no role for routine imaging. I will follow her with history, clinical exam at the end of the year.    Abdominal pain She has chronic constipation dependent on Miralax. She also had abnormal bowel wall thickening at the terminal ileum region. I recommend GI evaluation for colonoscopy   Orders Placed This Encounter  Procedures  . CBC with Differential/Platelet    Standing Status: Future     Number of Occurrences:      Standing Expiration Date: 09/20/2015  . Comprehensive metabolic panel    Standing Status: Future     Number of Occurrences:      Standing Expiration Date: 09/20/2015  . Lactate dehydrogenase    Standing Status: Future     Number of Occurrences:      Standing Expiration Date: 09/20/2015  . Ambulatory referral to Gastroenterology    Referral Priority:  Routine    Referral Type:  Consultation    Referral Reason:  Specialty Services Required    Number of Visits Requested:  1   All questions were  answered. The patient knows to call the clinic with any problems, questions or concerns. No barriers to learning was detected. I spent 15 minutes counseling the patient face to face. The total time spent in the appointment was 20 minutes and more than 50% was on counseling and review of test results     [facility], Tasfia Vasseur, MD 08/16/2014 1:39 PM  ,

## 2014-08-16 NOTE — Telephone Encounter (Signed)
gave and printed appt sched and avs for pt for DEC...Marland Kitchens.w Dr. Earlean Shawl office they need all pt information sent over and they will call pt to sched appt

## 2014-08-20 ENCOUNTER — Telehealth: Payer: Self-pay | Admitting: Hematology and Oncology

## 2014-08-20 NOTE — Telephone Encounter (Signed)
Faxed pt medical records to [phone]

## 2014-08-26 ENCOUNTER — Telehealth: Payer: Self-pay | Admitting: *Deleted

## 2014-08-26 NOTE — Telephone Encounter (Signed)
CHECKED PT.'S RELEASE FORM. MR.DAVID LANZER IS LISTED ON RELEASE FORM SO HIS PHONE NUMBER WAS GIVEN TO LACY AT DR.MEDOFF'S OFFICE AS AN ATTEMPT TO CONTACT PT.

## 2014-08-28 ENCOUNTER — Telehealth: Payer: Self-pay | Admitting: Cardiology

## 2014-08-28 MED ORDER — NITROGLYCERIN 0.4 MG SL SUBL: 0.4000 mg | SUBLINGUAL_TABLET | SUBLINGUAL | Status: AC | PRN

## 2014-08-28 NOTE — Telephone Encounter (Signed)
Returned call to patient NTG refill sent to pharmacy. 

## 2014-08-28 NOTE — Telephone Encounter (Signed)
New Prob   Pt is requesting a new prescription of Nitro sent to [facility] on [address].

## 2014-09-30 ENCOUNTER — Telehealth: Payer: Self-pay | Admitting: Internal Medicine

## 2014-09-30 NOTE — Telephone Encounter (Signed)
atc pt, line rang several times then went to fast busy signal.  Wcb.  

## 2014-10-01 NOTE — Telephone Encounter (Signed)
Called spoke with pt. She needed her DG esophagus report faxed over to Dr. Earlean Shawl. I have done so. Nothing further needed

## 2014-10-10 ENCOUNTER — Other Ambulatory Visit (HOSPITAL_COMMUNITY): Payer: Self-pay | Admitting: Specialist

## 2014-10-10 DIAGNOSIS — F062 Psychotic disorder with delusions due to known physiological condition: Secondary | ICD-10-CM

## 2014-10-28 ENCOUNTER — Ambulatory Visit: Payer: Medicare Other | Admitting: Internal Medicine

## 2014-10-30 ENCOUNTER — Encounter: Payer: Self-pay | Admitting: Internal Medicine

## 2014-10-30 ENCOUNTER — Telehealth: Payer: Self-pay | Admitting: Internal Medicine

## 2014-10-30 ENCOUNTER — Ambulatory Visit (INDEPENDENT_AMBULATORY_CARE_PROVIDER_SITE_OTHER): Payer: Medicare Other | Admitting: Internal Medicine

## 2014-10-30 VITALS — BP 124/70 | HR 86 | Ht 62.0 in

## 2014-10-30 DIAGNOSIS — F22 Delusional disorders: Secondary | ICD-10-CM

## 2014-10-30 DIAGNOSIS — G4733 Obstructive sleep apnea (adult) (pediatric): Secondary | ICD-10-CM | POA: Diagnosis not present

## 2014-10-30 DIAGNOSIS — G4730 Sleep apnea, unspecified: Secondary | ICD-10-CM

## 2014-10-30 DIAGNOSIS — G4700 Insomnia, unspecified: Secondary | ICD-10-CM

## 2014-10-30 NOTE — Telephone Encounter (Signed)
Study cancelled. Pt aware. Nothing further needed

## 2014-10-30 NOTE — Patient Instructions (Signed)
We can continue O2 3L for sleep/ [facility]  Order- schedule NPSG split protocol dx OSA, start on room air

## 2014-10-30 NOTE — Telephone Encounter (Signed)
Per 10/30/14 OV: Patient Instructions       We can continue O2 3L for sleep/ [facility]  Order- schedule NPSG split protocol dx OSA, start on room air  --  Called pt. She reports she is not able to have sleep study done. She is not able to afford it. She is already in a payment plan with [facility].  Advised will let Dr. Annamaria Boots know.

## 2014-10-30 NOTE — Progress Notes (Signed)
Patient ID: [name], female    DOB: Apr 19, 1935, 79 y.o.   MRN: [id number]  HPI 08/20/10- 79 yoF never smoker followed for OSA, dyspnea, rhinitis, complicated by mental health issues( Dr Toy Care) and by hx right mastectomy/ breast Ca Last here 02/05/10- note reviewed. She continues CPAP. Download showed use 3.5 hrs/ night. She says she pulls it off in her sleep.  The download indicated good control AHI 3.9, at 12 cwp best pressure. She seems motivated to continue. She denies waking much during the night.  Since last here she went hospital for cath due to recurrent chest pain- told no coronary blockage.  She no longer has home oxygen. Overnight oximetry on CPAP/ room air 07/21/10- 2 minutes, 20 seconds with sat < 88%. This is good enough to leave O2 off as she wishes.   02/12/11-  79 yoF never smoker followed for OSA, dyspnea, rhinitis, complicated by mental health issues( Dr Toy Care) and by hx right mastectomy/ breast Ca Failed CPAP- kept pulling it off. She does wear oxygen all night at 2 L as intended and sleeps better with this. Watery rhinorrhea in the mornings. She declines flu vaccine. Has avoided recent respiratory infections. She has been following politics closely and was quite sharp about these issues.  09/22/11- 79 yoF never smoker followed for OSA, dyspnea, rhinitis, complicated by mental health issues( Dr Toy Care) and by hx right mastectomy/ breast Ca Acute visit-sore throat x7 days. Raw throat-hurts to swallow-was given Amoxicillin and has helped; runny nose during the day, cough at night. We had called in amoxicillin earlier and she says that helped. OSA- she discussed her CPAP intolerance with Dr.Kaur/ Psychiatry, who blamed anxiety for taking the mask off. She sleeps comfortably using oxygen. She referred to "female" who comes in and out of her house, adjusts her oxygen and other things but apparently has never been seen by anybody. She can just tell he's been there because she sees things have  been moved.  02/04/12- 79 yoF never smoker followed for OSA, dyspnea, rhinitis, complicated by mental health issues( Dr Toy Care) and by hx right mastectomy/ breast Ca FOLLOWS WHQ:PRFFMBW options of going back on CPAP vs just O2(states that she woke up again and "tutti frutti"( invisible man who moves her furniture and takes her mask off at night) took mask off-she was able to put back on and continue sleeping) She declines flu vaccine. She had asked to sleep with a simple mask and oxygen- through [facility], this required 5 L. She has been taping on her mask at night but still finds it pulled off. Security officers have checked her house and under her bed but don't see him because "he curls up under the blanket". CXR 1/27/ 13 IMPRESSION:  No evidence of active pulmonary disease. Cardiac enlargement.  Original Report Authenticated By: Neale Burly, M.D.  04/27/12- 79 yoF never smoker followed for OSA, dyspnea, rhinitis, complicated by mental health issues( Dr Toy Care) and by hx right mastectomy/ breast Ca ACUTE VISIT: coughing thorugh the night that sounds raspy-wheezing, States runny nose and cough is coming from O2 at night. Also, would like to speak with CY about maybe using CPAP again. Has not pulled O2 tubing out of nose at all. She blames "stress" as the reason why she would pull her CPAP of repeatedly at night. She says that stress is now much better and she would like to try CPAP again. Noticing raspy cough especially at night over the last 2 months. Denies pain  in throat for chest. Little sputum. No fever.  03/13/13- 79 yoF never smoker followed for OSA/ failed CPAP, dyspnea, rhinitis, complicated by mental health issues( Dr Toy Care) and by hx right mastectomy/ breast Ca         Aide here from [place]. FOLLOWS FOR: sleeping well as long as she has her Rx- amitriptyline. Patient reports breathing fine without need for humidifier on her oxygen concentrator. Continuous oxygen 3 L/[facility]. She again  actively discussed her delusional character "[name]", who was never seen by others, has followed her through moves, and enters her apartment through locked doors to unplug her oxygen tubing and take it out of her nose at night, repeatedly take off her CPAP when she was still trying to wear it.  10/24/13- 79 yoF never smoker followed for OSA/ failed CPAP, dyspnea, rhinitis, complicated by mental health issues( Dr Toy Care) and by hx right mastectomy/ breast Ca          FOLLOWS FOR:Pt only uses O2 at night; not a CPAP. Breathing doing well and continues to take physical therapy. DME is [facility]. She would like overnight oximetry to see if she still needs oxygen. She never could keep CPAP on at always insisted that some on the scene person came into her a lot department and would take it off at night. Today she tells me that "[name]" has been arrested and is no longer a threat to her. She asks fill out DOT form about her OSA-done.  10/30/14- 79 yoF never smoker followed for OSA/ failed CPAP, dyspnea, rhinitis, complicated by mental health issues( Dr Toy Care) and by hx right mastectomy/ breast Ca  FOLLOWS FOR: continues to wear O2 at night through [facility]; has had some issues with using O2-"[name]"; had to call EMS twice as "[name]" tied the cannula above the bed. Still follows with Dr Humberto Seals O2 3L/  She wanted to try CPAP again, stating that her delusional nighttime visitor wouldn't disturb it, although that's why CPAP was stopped before CT chest 07/10/14 CT CHEST FINDINGS Mediastinum/Nodes: No pathologic thoracic adenopathy. Left anterior descending coronary artery atherosclerosis. Mild cardiomegaly. Right mastectomy. Lungs/Pleura: Unremarkable Musculoskeletal: Thoracic spondylosis with bridging anterior spurring at most thoracic levels.  Review of Systems-see HPI Constitutional:   No-   weight loss, night sweats, fevers, chills, fatigue, lassitude. HEENT:   No-  headaches,  difficulty swallowing, tooth/dental problems, sore throat,       No-  sneezing, itching, ear ache, nasal congestion,  +post nasal drip,  CV:  No-   chest pain, orthopnea, PND, swelling in lower extremities, anasarca, dizziness, palpitations Resp: No- acute  shortness of breath with exertion or at rest, wearing oxygen.            No-   productive cough,  + non-productive cough,  No- coughing up of blood.              No-   change in color of mucus.  No- wheezing.   Skin: No-   rash or lesions. GI:  No-   heartburn, indigestion, abdominal pain, nausea, vomiting,  GU:  MS:  No-   joint pain or swelling.   Neuro-     nothing unusual Psych:  No- change in mood or affect. No acute depression or anxiety.  No memory loss.  Objective:  Physical Exam  General- Alert, Oriented, Affect-cheerful, Distress- none acute, bright and alert, wheelchair, Obese Skin- rash-none, lesions- none, excoriation- none Lymphadenopathy- none Head- atraumatic  Eyes- Gross vision intact, PERRLA, conjunctivae clear secretions            Ears- Hearing, canals-normal            Nose- Clear, no-Septal dev, mucus, polyps, erosion, perforation             Throat- Mallampati II , mucosa clear , drainage- none, tonsils- atrophic Neck- flexible , trachea midline, no stridor , thyroid nl, carotid no bruit Chest - symmetrical excursion , unlabored           Heart/CV- RRR , no murmur , no gallop  , no rub, nl s1 s2                           - JVD- none , edema- none, stasis changes- none, varices- none           Lung- clear to P&A, wheeze- none, cough- none , dullness-none, rub- none           Chest wall- right mastectomy Abd-  Br/ Gen/ Rectal- Not done, not indicated Extrem- cyanosis- none, clubbing, none, atrophy- none, strength- nl Neuro- grossly intact to observation

## 2014-10-30 NOTE — Telephone Encounter (Signed)
Ok to cancel order for sleep study

## 2014-10-31 ENCOUNTER — Telehealth: Payer: Self-pay | Admitting: Internal Medicine

## 2014-10-31 NOTE — Telephone Encounter (Signed)
Pt called back right after my call to her; she is aware that CY approved for her to be seen in 6 months instead of the 4 month OV he stated at her last OV. Pt is aware but states that she would rather keep her 4 month with CY just in case things change. Pt aware to keep her appt 02-2015 with CY and nothing more needed at this time.

## 2014-11-03 NOTE — Assessment & Plan Note (Addendum)
She blames her delusional visitor for disrupting her oxygen line but the problem is sleepwalking We considered an attended sleep study, hoping video and technician might observe the sleepwalking and nocturnal confusion. Unfortunately she says she cannot afford this study.

## 2014-11-03 NOTE — Assessment & Plan Note (Signed)
She remains convinced that a delusional visitor "tutti-frutti" comes at night and disrupts her oxygen and CPAP. She has called EMS, police and support services from [facility] multiple times over the years because of this.

## 2014-11-11 ENCOUNTER — Ambulatory Visit (HOSPITAL_COMMUNITY): Payer: Medicare Other

## 2014-11-11 ENCOUNTER — Telehealth: Payer: Self-pay | Admitting: Pulmonary Disease

## 2014-11-11 NOTE — Telephone Encounter (Signed)
Made in error

## 2014-11-12 HISTORY — PX: COLONOSCOPY WITH ESOPHAGOGASTRODUODENOSCOPY (EGD): SHX5779

## 2014-11-18 ENCOUNTER — Telehealth: Payer: Self-pay | Admitting: Internal Medicine

## 2014-11-18 NOTE — Telephone Encounter (Signed)
Called and spoke to pt. Informed her of the recs per CY. Pt verbalized understanding and denied any further questions or concerns at this time.   

## 2014-11-18 NOTE — Telephone Encounter (Signed)
If she is anxious about it, then for her peace of mind, she can call her cardiology office and let them know  she would feel better getting her carotid ultrasound repeated sooner than 1 year.

## 2014-11-18 NOTE — Telephone Encounter (Signed)
Patient says she has blockage in Carotid Artery, they did [place] in April of this year.  She wants to know if Dr. Annamaria Boots thinks it would be to her advantage to ask the Cardiologist to do another [place] of her carotid artery.  She says that 6 months has gone by and she doesn't know that she should wait a full year.    Dr. Annamaria Boots, please advise.

## 2014-11-18 NOTE — Telephone Encounter (Signed)
lmtcb for pt.  

## 2014-11-18 NOTE — Telephone Encounter (Signed)
[phone] pt calling back

## 2014-12-06 ENCOUNTER — Telehealth: Payer: Self-pay | Admitting: Internal Medicine

## 2014-12-06 NOTE — Telephone Encounter (Signed)
Pt stated that her blood pressure is 116/78, pulse 88, O2 is 96%.  Pt states that her "crisis" (meaning Marisa Severin Frutti) has gotten worse.  Pt says she has an aide that comes in on Tuesdays and Thursdays afternoons.  Her aide went into the back room with a broom to try to get Marisa Severin Frutti out from under her bed, but he told her to leave him alone.  She said that she has never heard him speak before.  She said that she cannot call the police because the "police do not have sense enough to find him".   She wanted to make Dr. Annamaria Boots aware.

## 2014-12-09 NOTE — Telephone Encounter (Signed)
Dr. Annamaria Boots, please advise if okay to close message? thanks

## 2014-12-10 ENCOUNTER — Telehealth: Payer: Self-pay | Admitting: *Deleted

## 2014-12-10 ENCOUNTER — Telehealth: Payer: Self-pay | Admitting: Hematology and Oncology

## 2014-12-10 NOTE — Telephone Encounter (Signed)
POF sent to move appt from 12/16 to 12/20 or 12/21.  Scheduler to call pt w/ new d/t.

## 2014-12-10 NOTE — Telephone Encounter (Signed)
lvm for pt regarding to 12.16 appt cx and moved to 12.20.Marland KitchenMarland KitchenMarland Kitchen

## 2014-12-10 NOTE — Telephone Encounter (Signed)
"  My Mammogram is scheduled on 02-03-2015 at [facility].  I believe she needs these results before seeing me.  Can Dr. Alvy Bimler reschedule my 01-31-2015 F/U to December 20th or 21st?  Can someone call me to let me know what she decides?  Return number [phone]."

## 2014-12-11 ENCOUNTER — Telehealth: Payer: Self-pay | Admitting: Hematology and Oncology

## 2014-12-11 NOTE — Telephone Encounter (Signed)
returned call and s.w. pt and confirmed DEC appt.Marland KitchenMarland KitchenMarland KitchenMarland Kitchenpt ok and aware

## 2015-01-16 ENCOUNTER — Ambulatory Visit (HOSPITAL_BASED_OUTPATIENT_CLINIC_OR_DEPARTMENT_OTHER): Payer: Medicare Other

## 2015-01-31 ENCOUNTER — Ambulatory Visit: Payer: Medicare Other | Admitting: Hematology and Oncology

## 2015-01-31 ENCOUNTER — Other Ambulatory Visit: Payer: Medicare Other

## 2015-02-04 ENCOUNTER — Encounter: Payer: Self-pay | Admitting: Hematology and Oncology

## 2015-02-04 ENCOUNTER — Telehealth: Payer: Self-pay | Admitting: Hematology and Oncology

## 2015-02-04 ENCOUNTER — Other Ambulatory Visit (HOSPITAL_BASED_OUTPATIENT_CLINIC_OR_DEPARTMENT_OTHER): Payer: Medicare Other

## 2015-02-04 ENCOUNTER — Ambulatory Visit (HOSPITAL_BASED_OUTPATIENT_CLINIC_OR_DEPARTMENT_OTHER): Payer: Medicare Other | Admitting: Hematology and Oncology

## 2015-02-04 VITALS — BP 129/57 | HR 94 | Temp 97.9°F | Resp 16 | Ht 62.0 in

## 2015-02-04 DIAGNOSIS — Z853 Personal history of malignant neoplasm of breast: Secondary | ICD-10-CM

## 2015-02-04 DIAGNOSIS — E782 Mixed hyperlipidemia: Secondary | ICD-10-CM

## 2015-02-04 DIAGNOSIS — R945 Abnormal results of liver function studies: Secondary | ICD-10-CM | POA: Diagnosis not present

## 2015-02-04 DIAGNOSIS — Z8572 Personal history of non-Hodgkin lymphomas: Secondary | ICD-10-CM

## 2015-02-04 DIAGNOSIS — K760 Fatty (change of) liver, not elsewhere classified: Secondary | ICD-10-CM

## 2015-02-04 DIAGNOSIS — R7989 Other specified abnormal findings of blood chemistry: Secondary | ICD-10-CM

## 2015-02-04 DIAGNOSIS — C8330 Diffuse large B-cell lymphoma, unspecified site: Secondary | ICD-10-CM

## 2015-02-04 LAB — LACTATE DEHYDROGENASE: LDH: 206 U/L (ref 125–245)

## 2015-02-04 LAB — CBC WITH DIFFERENTIAL/PLATELET
BASO%: 0.4 % (ref 0.0–2.0)
BASOS ABS: 0 10*3/uL (ref 0.0–0.1)
EOS%: 2.2 % (ref 0.0–7.0)
Eosinophils Absolute: 0.2 10*3/uL (ref 0.0–0.5)
HCT: 40.1 % (ref 34.8–46.6)
HGB: 12.9 g/dL (ref 11.6–15.9)
LYMPH%: 16.4 % (ref 14.0–49.7)
MCH: 27.5 pg (ref 25.1–34.0)
MCHC: 32.1 g/dL (ref 31.5–36.0)
MCV: 85.5 fL (ref 79.5–101.0)
MONO#: 0.6 10*3/uL (ref 0.1–0.9)
MONO%: 7.1 % (ref 0.0–14.0)
NEUT#: 6 10*3/uL (ref 1.5–6.5)
NEUT%: 73.9 % (ref 38.4–76.8)
Platelets: 229 10*3/uL (ref 145–400)
RBC: 4.69 10*6/uL (ref 3.70–5.45)
RDW: 16 % — ABNORMAL HIGH (ref 11.2–14.5)
WBC: 8.1 10*3/uL (ref 3.9–10.3)
lymph#: 1.3 10*3/uL (ref 0.9–3.3)

## 2015-02-04 LAB — COMPREHENSIVE METABOLIC PANEL
ALBUMIN: 3.6 g/dL (ref 3.5–5.0)
ALK PHOS: 224 U/L — AB (ref 40–150)
ALT: 31 U/L (ref 0–55)
AST: 20 U/L (ref 5–34)
Anion Gap: 9 mEq/L (ref 3–11)
BUN: 13.6 mg/dL (ref 7.0–26.0)
CHLORIDE: 103 meq/L (ref 98–109)
CO2: 28 mEq/L (ref 22–29)
Calcium: 9.7 mg/dL (ref 8.4–10.4)
Creatinine: 0.9 mg/dL (ref 0.6–1.1)
EGFR: 64 mL/min/{1.73_m2} — AB (ref >90)
GLUCOSE: 120 mg/dL (ref 70–140)
POTASSIUM: 4.4 meq/L (ref 3.5–5.1)
SODIUM: 140 meq/L (ref 136–145)
Total Bilirubin: 0.99 mg/dL (ref 0.20–1.20)
Total Protein: 7 g/dL (ref 6.4–8.3)

## 2015-02-04 NOTE — Assessment & Plan Note (Signed)
Breast examination today did not detect any abnormalities. On the left breast, at the lateral edge of her scar, there is some mild redness at the edge of the scar. I recommend observation only. She is due for mammogram next month.

## 2015-02-04 NOTE — Telephone Encounter (Signed)
Scheduled appt with patient while he was here in person at [facility] Scheduling office.       [facility]. °

## 2015-02-04 NOTE — Assessment & Plan Note (Signed)
I suspect this could be due to fatty liver disease or increased bone turnover. PET CT scan is negative for liver metastasis of bone metastasis. I recommend close observation only. 

## 2015-02-04 NOTE — Progress Notes (Signed)
[place] OFFICE PROGRESS NOTE  Patient Care Team: Lona Kettle, MD as PCP - General (Family Medicine) Prince Solian, MD as Consulting Physician (Internal Medicine)  SUMMARY OF ONCOLOGIC HISTORY: Oncology History   Lymphoma, diffuse large B cell Bone marrow aspirate and biopsy was not performed due to scheduling issues and the need to start treatment ASAP   Primary site: Lymphoid Neoplasms   Staging method: AJCC 6th Edition   Clinical: Stage III signed by Heath Lark, MD on 05/30/2013  8:37 PM   Summary: Stage III       History of B-cell lymphoma   05/02/2013 Procedure She has fine-needle aspirate and biopsy of left cervical lymph node, suspicious for high-grade lymphoma   05/10/2013 Surgery Excisional lymph node biopsy confirmed diffuse large B-cell lymphoma   06/04/2013 Procedure The patient have placement of Infuse-a-Port   06/05/2013 Imaging PET scan showed disease both above and below the diaphragm, consistent with stage III   06/12/2013 - 11/28/2013 Chemotherapy The patient received 6 cycles of bendamustine with rituximab.   09/03/2013 Imaging Repeat PET CT scan show complete response to treatment.   01/08/2014 Imaging PET/CT scan showed complete response to treatment   07/10/2014 Imaging CT scan of the chest, abdomen and pelvis show thickening in the terminal ileum region. She has a large adnexal mass which is unchanged.    INTERVAL HISTORY: Please see below for problem oriented charting. She returns for further follow-up. She is concerned about lymphoma recurrence and breast cancer recurrence. She is also concerned about hyperlipidemia and recent carotid ultrasound result. She denies new lymphadenopathy. No new palpable breast lesions. She denies any chest pain, shortness of breath, lightheadedness or dizziness. No new neurological deficit. She is taking medicine for cholesterol and apparently is responding to treatment. She also takes aspirin to prevent heart  disease  REVIEW OF SYSTEMS:   Constitutional: Denies fevers, chills or abnormal weight loss Eyes: Denies blurriness of vision Ears, nose, mouth, throat, and face: Denies mucositis or sore throat Respiratory: Denies cough, dyspnea or wheezes Cardiovascular: Denies palpitation, chest discomfort or lower extremity swelling Gastrointestinal:  Denies nausea, heartburn or change in bowel habits Skin: Denies abnormal skin rashes Lymphatics: Denies new lymphadenopathy or easy bruising Neurological:Denies numbness, tingling or new weaknesses Behavioral/Psych: Mood is stable, no new changes  All other systems were reviewed with the patient and are negative.  I have reviewed the past medical history, past surgical history, social history and family history with the patient and they are unchanged from previous note.  ALLERGIES:  has No Known Allergies.  MEDICATIONS:  Current Outpatient Prescriptions  Medication Sig Dispense Refill  . amitriptyline (ELAVIL) 100 MG tablet Take 100 mg by mouth at bedtime.    Marland Kitchen aspirin 81 MG EC tablet Take 81 mg by mouth every morning.     Marland Kitchen atorvastatin (LIPITOR) 40 MG tablet Take 1 tablet (40 mg total) by mouth every morning. 90 tablet 3  . Calcium Carbonate-Vit D-Min (CALTRATE 600+D PLUS) 600-400 MG-UNIT per tablet Take 1 tablet by mouth every morning.     . cholecalciferol (VITAMIN D) 1000 UNITS tablet Take 1,000 Units by mouth every morning.     . Cinnamon 500 MG TABS Take 1,000 mg by mouth every morning.     Marland Kitchen ibuprofen (ADVIL,MOTRIN) 200 MG tablet Take 400 mg by mouth daily as needed for moderate pain.    Marland Kitchen LORazepam (ATIVAN) 2 MG tablet Take 2 mg by mouth 2 (two) times daily.     Marland Kitchen  MAGNESIUM OXIDE PO Take 2 tablets by mouth every morning.     . multivitamin-iron-minerals-folic acid (CENTRUM) chewable tablet Chew 1 tablet by mouth every morning.     . nitroGLYCERIN (NITROSTAT) 0.4 MG SL tablet Place 1 tablet (0.4 mg total) under the tongue every 5 (five)  minutes as needed for chest pain. 25 tablet 11  . Omega-3 Fatty Acids (FISH OIL) 1000 MG CAPS Take 1,000 mg by mouth every morning.     . polyethylene glycol powder (GLYCOLAX/MIRALAX) powder Take 17 g by mouth 2 (two) times daily as needed. 850 g 5  . quinapril (ACCUPRIL) 20 MG tablet Take 1 tablet (20 mg total) by mouth every morning. 90 tablet 3  . Sennosides (SENNA LAX PO) Take 2 tablets by mouth every morning.     No current facility-administered medications for this visit.    PHYSICAL EXAMINATION: ECOG PERFORMANCE STATUS: 2 - Symptomatic, <50% confined to bed  Filed Vitals:   02/04/15 1148  BP: 129/57  Pulse: 94  Temp: 97.9 F (36.6 C)  Resp: 16   Filed Weights    GENERAL:alert, no distress and comfortable. She is obese SKIN: skin color, texture, turgor are normal, no rashes or significant lesions EYES: normal, Conjunctiva are pink and non-injected, sclera clear OROPHARYNX:no exudate, no erythema and lips, buccal mucosa, and tongue normal  NECK: supple, thyroid normal size, non-tender, without nodularity LYMPH:  no palpable lymphadenopathy in the cervical, axillary or inguinal LUNGS: clear to auscultation and percussion with normal breathing effort HEART: regular rate & rhythm and no murmurs and no lower extremity edema ABDOMEN:abdomen soft, non-tender and normal bowel sounds Musculoskeletal:no cyanosis of digits and no clubbing  NEURO: alert & oriented x 3 with fluent speech, no focal motor/sensory deficits Breast examination: she has well-healed mastectomy scar on the right chest wall with no other abnormalities. On the left breast, at the edge of a scar in the upper inner quadrant of her left breast, there is mild redness at the age of the scar but not diagnostic of abnormalities. Multiple keloids are noted LABORATORY DATA:  I have reviewed the data as listed    Component Value Date/Time   NA 140 02/04/2015 1028   NA 140 05/21/2014 1630   K 4.4 02/04/2015 1028   K  4.0 05/21/2014 1630   CL 103 05/21/2014 1630   CL 107 04/05/2012 1158   CO2 28 02/04/2015 1028   CO2 26 05/21/2014 1630   GLUCOSE 120 02/04/2015 1028   GLUCOSE 91 05/21/2014 1630   GLUCOSE 144* 04/05/2012 1158   BUN 13.6 02/04/2015 1028   BUN 15 05/21/2014 1630   CREATININE 0.9 02/04/2015 1028   CREATININE 0.72 05/21/2014 1630   CREATININE 0.80 04/18/2014 1704   CALCIUM 9.7 02/04/2015 1028   CALCIUM 9.6 05/21/2014 1630   PROT 7.0 02/04/2015 1028   PROT 6.4 05/21/2014 1630   ALBUMIN 3.6 02/04/2015 1028   ALBUMIN 4.0 05/21/2014 1630   AST 20 02/04/2015 1028   AST 22 05/21/2014 1630   ALT 31 02/04/2015 1028   ALT 29 05/21/2014 1630   ALKPHOS 224* 02/04/2015 1028   ALKPHOS 229* 05/21/2014 1630   BILITOT 0.99 02/04/2015 1028   BILITOT 0.8 05/21/2014 1630   GFRNONAA 82* 06/06/2013 0830   GFRAA >90 06/06/2013 0830    No results found for: SPEP, UPEP  Lab Results  Component Value Date   WBC 8.1 02/04/2015   NEUTROABS 6.0 02/04/2015   HGB 12.9 02/04/2015   HCT 40.1 02/04/2015  MCV 85.5 02/04/2015   PLT 229 02/04/2015      Chemistry      Component Value Date/Time   NA 140 02/04/2015 1028   NA 140 05/21/2014 1630   K 4.4 02/04/2015 1028   K 4.0 05/21/2014 1630   CL 103 05/21/2014 1630   CL 107 04/05/2012 1158   CO2 28 02/04/2015 1028   CO2 26 05/21/2014 1630   BUN 13.6 02/04/2015 1028   BUN 15 05/21/2014 1630   CREATININE 0.9 02/04/2015 1028   CREATININE 0.72 05/21/2014 1630   CREATININE 0.80 04/18/2014 1704      Component Value Date/Time   CALCIUM 9.7 02/04/2015 1028   CALCIUM 9.6 05/21/2014 1630   ALKPHOS 224* 02/04/2015 1028   ALKPHOS 229* 05/21/2014 1630   AST 20 02/04/2015 1028   AST 22 05/21/2014 1630   ALT 31 02/04/2015 1028   ALT 29 05/21/2014 1630   BILITOT 0.99 02/04/2015 1028   BILITOT 0.8 05/21/2014 1630     I review her carotid ultrasound report and prior mammogram report ASSESSMENT & PLAN:  History of B-cell lymphoma The patient has  complete response to treatment. Blood work so far has been normal. Recent CT scan in May 2016 show no evidence of disease. The patient is still concerned she had cancer. I recommend ordering CT scan one more time next year and if is negative, we will not do routine surveillance imaging    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 - Breast examination today did not detect any abnormalities. On the left breast, at the lateral edge of her scar, there is some mild redness at the edge of the scar. I recommend observation only. She is due for mammogram next month.  Elevated liver function tests I suspect this could be due to fatty liver disease or increased bone turnover. PET CT scan is negative for liver metastasis of bone metastasis. I recommend close observation only.  Hyperlipidemia, mixed She has hyperlipidemia and fatty liver disease. She is also known to have atherosclerosis and peripheral vascular disease. She is very concerned about her carotid ultrasound results from May. The patient is currently on maximum medical management and I recommend she continue to same and not worry about her next carotid ultrasound results. She is currently asymptomatic   Orders Placed This Encounter  Procedures  . CT Chest W Contrast    Standing Status: Future     Number of Occurrences:      Standing Expiration Date: 05/06/2016    Order Specific Question:  Reason for Exam (SYMPTOM  OR DIAGNOSIS REQUIRED)    Answer:  staging lymphoma, breast ca, exclude recurrence    Order Specific Question:  Preferred imaging location?    Answer:  [facility]  . CT Abdomen Pelvis W Contrast    Standing Status: Future     Number of Occurrences:      Standing Expiration Date: 05/06/2016    Order Specific Question:  Reason for Exam (SYMPTOM  OR DIAGNOSIS REQUIRED)    Answer:  staging lymphoma, breast ca, exclude recurrence    Order Specific Question:  Preferred imaging  location?    Answer:  [facility]  . CBC with Differential/Platelet    Standing Status: Future     Number of Occurrences:      Standing Expiration Date: 05/06/2016  . Comprehensive metabolic panel    Standing Status: Future     Number of Occurrences:  Standing Expiration Date: 05/06/2016   All questions were answered. The patient knows to call the clinic with any problems, questions or concerns. No barriers to learning was detected. I spent 20 minutes counseling the patient face to face. The total time spent in the appointment was 25 minutes and more than 50% was on counseling and review of test results     [facility], [place], MD 02/04/2015 1:29 PM

## 2015-02-04 NOTE — Assessment & Plan Note (Signed)
She has hyperlipidemia and fatty liver disease. She is also known to have atherosclerosis and peripheral vascular disease. She is very concerned about her carotid ultrasound results from May. The patient is currently on maximum medical management and I recommend she continue to same and not worry about her next carotid ultrasound results. She is currently asymptomatic

## 2015-02-04 NOTE — Assessment & Plan Note (Signed)
The patient has complete response to treatment. Blood work so far has been normal. Recent CT scan in May 2016 show no evidence of disease. The patient is still concerned she had cancer. I recommend ordering CT scan one more time next year and if is negative, we will not do routine surveillance imaging

## 2015-02-12 ENCOUNTER — Telehealth: Payer: Self-pay | Admitting: Internal Medicine

## 2015-02-12 NOTE — Telephone Encounter (Signed)
Pt said she figured it out.

## 2015-02-26 ENCOUNTER — Ambulatory Visit (INDEPENDENT_AMBULATORY_CARE_PROVIDER_SITE_OTHER): Payer: PPO | Admitting: Internal Medicine

## 2015-02-26 ENCOUNTER — Encounter: Payer: Self-pay | Admitting: Internal Medicine

## 2015-02-26 VITALS — BP 122/72 | HR 88

## 2015-02-26 DIAGNOSIS — G4700 Insomnia, unspecified: Secondary | ICD-10-CM

## 2015-02-26 DIAGNOSIS — F22 Delusional disorders: Secondary | ICD-10-CM | POA: Diagnosis not present

## 2015-02-26 DIAGNOSIS — G4730 Sleep apnea, unspecified: Secondary | ICD-10-CM | POA: Diagnosis not present

## 2015-02-26 NOTE — Assessment & Plan Note (Signed)
She seems quite lucid with appropriate organized thought process during the day. Her delusional interloper "tutti-frutti" has been blamed in the past for taking her CPAP off repeatedly. She does not mention recent problems.

## 2015-02-26 NOTE — Progress Notes (Signed)
Patient ID: [name], female    DOB: Apr 19, 1935, 80 y.o.   MRN: [id number]  HPI 08/20/10- 33 yoF never smoker followed for OSA, dyspnea, rhinitis, complicated by mental health issues( Dr Toy Care) and by hx right mastectomy/ breast Ca Last here 02/05/10- note reviewed. She continues CPAP. Download showed use 3.5 hrs/ night. She says she pulls it off in her sleep.  The download indicated good control AHI 3.9, at 12 cwp best pressure. She seems motivated to continue. She denies waking much during the night.  Since last here she went hospital for cath due to recurrent chest pain- told no coronary blockage.  She no longer has home oxygen. Overnight oximetry on CPAP/ room air 07/21/10- 2 minutes, 20 seconds with sat < 88%. This is good enough to leave O2 off as she wishes.   02/12/11-  75 yoF never smoker followed for OSA, dyspnea, rhinitis, complicated by mental health issues( Dr Toy Care) and by hx right mastectomy/ breast Ca Failed CPAP- kept pulling it off. She does wear oxygen all night at 2 L as intended and sleeps better with this. Watery rhinorrhea in the mornings. She declines flu vaccine. Has avoided recent respiratory infections. She has been following politics closely and was quite sharp about these issues.  09/22/11- 76 yoF never smoker followed for OSA, dyspnea, rhinitis, complicated by mental health issues( Dr Toy Care) and by hx right mastectomy/ breast Ca Acute visit-sore throat x7 days. Raw throat-hurts to swallow-was given Amoxicillin and has helped; runny nose during the day, cough at night. We had called in amoxicillin earlier and she says that helped. OSA- she discussed her CPAP intolerance with Dr.Kaur/ Psychiatry, who blamed anxiety for taking the mask off. She sleeps comfortably using oxygen. She referred to "female" who comes in and out of her house, adjusts her oxygen and other things but apparently has never been seen by anybody. She can just tell he's been there because she sees things have  been moved.  02/04/12- 76 yoF never smoker followed for OSA, dyspnea, rhinitis, complicated by mental health issues( Dr Toy Care) and by hx right mastectomy/ breast Ca FOLLOWS WHQ:PRFFMBW options of going back on CPAP vs just O2(states that she woke up again and "tutti frutti"( invisible man who moves her furniture and takes her mask off at night) took mask off-she was able to put back on and continue sleeping) She declines flu vaccine. She had asked to sleep with a simple mask and oxygen- through [facility], this required 5 L. She has been taping on her mask at night but still finds it pulled off. Security officers have checked her house and under her bed but don't see him because "he curls up under the blanket". CXR 1/27/ 13 IMPRESSION:  No evidence of active pulmonary disease. Cardiac enlargement.  Original Report Authenticated By: Neale Burly, M.D.  04/27/12- 6 yoF never smoker followed for OSA, dyspnea, rhinitis, complicated by mental health issues( Dr Toy Care) and by hx right mastectomy/ breast Ca ACUTE VISIT: coughing thorugh the night that sounds raspy-wheezing, States runny nose and cough is coming from O2 at night. Also, would like to speak with CY about maybe using CPAP again. Has not pulled O2 tubing out of nose at all. She blames "stress" as the reason why she would pull her CPAP of repeatedly at night. She says that stress is now much better and she would like to try CPAP again. Noticing raspy cough especially at night over the last 2 months. Denies pain  in throat for chest. Little sputum. No fever.  03/13/13- 82 yoF never smoker followed for OSA/ failed CPAP, dyspnea, rhinitis, complicated by mental health issues( Dr Toy Care) and by hx right mastectomy/ breast Ca         Aide here from [place]. FOLLOWS FOR: sleeping well as long as she has her Rx- amitriptyline. Patient reports breathing fine without need for humidifier on her oxygen concentrator. Continuous oxygen 3 L/[facility]. She again  actively discussed her delusional character "[name]", who was never seen by others, has followed her through moves, and enters her apartment through locked doors to unplug her oxygen tubing and take it out of her nose at night, repeatedly take off her CPAP when she was still trying to wear it.  10/24/13- 67 yoF never smoker followed for OSA/ failed CPAP, dyspnea, rhinitis, complicated by mental health issues( Dr Toy Care) and by hx right mastectomy/ breast Ca          FOLLOWS FOR:Pt only uses O2 at night; not a CPAP. Breathing doing well and continues to take physical therapy. DME is [facility]. She would like overnight oximetry to see if she still needs oxygen. She never could keep CPAP on at always insisted that some on the scene person came into her a lot department and would take it off at night. Today she tells me that "[name]" has been arrested and is no longer a threat to her. She asks fill out DOT form about her OSA-done.  10/30/14- 79 yoF never smoker followed for OSA/ failed CPAP, dyspnea, rhinitis, complicated by mental health issues( Dr Toy Care) and by hx right mastectomy/ breast Ca  FOLLOWS FOR: continues to wear O2 at night through [facility]; has had some issues with using O2-"[name]"; had to call EMS twice as "[name]" tied the cannula above the bed. Still follows with Dr Humberto Seals O2 3L/  She wanted to try CPAP again, stating that her delusional nighttime visitor wouldn't disturb it, although that's why CPAP was stopped before CT chest 07/10/14 CT CHEST FINDINGS Mediastinum/Nodes: No pathologic thoracic adenopathy. Left anterior descending coronary artery atherosclerosis. Mild cardiomegaly. Right mastectomy. Lungs/Pleura: Unremarkable Musculoskeletal: Thoracic spondylosis with bridging anterior spurring at most thoracic levels.  02/26/15-79 yoF never smoker followed for OSA/ failed CPAP, dyspnea, rhinitis, complicated by mental health issues( Dr Toy Care) and by hx right  mastectomy/ breast Ca O2 2L/ Sleep    [facility] FOLLOW FOR: patient says she is doing well.  She is trying to lose weight.  no concerns. Denies shortness of breath or acute respiratory problems. Continues oxygen 2 L for sleep instead of CPAP We reviewed CT of chest from May and discussed status of her lymphoma (inactive) and breast cancer(14 years without recurrence) followed by oncology Spends most of time in wheelchair because of "tight tendons" with unsuccessful physical therapy. CT chest 07/10/14 FINDINGS: CT CHEST FINDINGS Mediastinum/Nodes: No pathologic thoracic adenopathy. Left anterior descending coronary artery atherosclerosis. Mild cardiomegaly. Right mastectomy. Lungs/Pleura: Unremarkable Musculoskeletal: Thoracic spondylosis with bridging anterior spurring at most thoracic levels.   Review of Systems-see HPI Constitutional:   No-   weight loss, night sweats, fevers, chills, fatigue, lassitude. HEENT:   No-  headaches, difficulty swallowing, tooth/dental problems, sore throat,       No-  sneezing, itching, ear ache, nasal congestion,  +post nasal drip,  CV:  No-   chest pain, orthopnea, PND, swelling in lower extremities, anasarca, dizziness, palpitations Resp: No- acute  shortness of breath with exertion or at rest, wearing  oxygen.            No-   productive cough,  + non-productive cough,  No- coughing up of blood.              No-   change in color of mucus.  No- wheezing.   Skin: No-   rash or lesions. GI:  No-   heartburn, indigestion, abdominal pain, nausea, vomiting,  GU:  MS:  No-   joint pain or swelling.   Neuro-     nothing unusual Psych:  No- change in mood or affect. No acute depression or anxiety.  No memory loss.  Objective:  Physical Exam  General- Alert, Oriented, Affect-cheerful, Distress- none acute, bright and alert, + wheelchair, Obese Skin- rash-none, lesions- none, excoriation- none Lymphadenopathy- none Head- atraumatic            Eyes-  Gross vision intact, PERRLA, conjunctivae clear secretions            Ears- Hearing, canals-normal            Nose- Clear, no-Septal dev, mucus, polyps, erosion, perforation             Throat- Mallampati II , mucosa clear , drainage- none, tonsils- atrophic Neck- flexible , trachea midline, no stridor , thyroid nl, carotid no bruit Chest - symmetrical excursion , unlabored           Heart/CV- RRR , no murmur , no gallop  , no rub, nl s1 s2                           - JVD- none , edema- none, stasis changes- none, varices- none           Lung- clear to P&A, wheeze- none, cough + dry , dullness-none, rub- none           Chest wall- + right mastectomy Abd-  Br/ Gen/ Rectal- Not done, not indicated Extrem- cyanosis- none, clubbing, none, atrophy- none, strength- nl Neuro- grossly intact to observation

## 2015-02-26 NOTE — Patient Instructions (Addendum)
Ok to continue O2  2L/ for sleep/ [facility]  You are up to date on flu and pneumonia vaccines through Dr Harrington Challenger' office

## 2015-02-26 NOTE — Assessment & Plan Note (Signed)
She indicates she is been keeping her oxygen on at night and sleeping fairly well without significant daytime sleepiness. This is used instead of CPAP which she could not keep on

## 2015-02-27 ENCOUNTER — Telehealth: Payer: Self-pay | Admitting: Internal Medicine

## 2015-02-27 ENCOUNTER — Telehealth: Payer: Self-pay | Admitting: Cardiology

## 2015-02-27 NOTE — Telephone Encounter (Signed)
Spoke with pt, states that she had mailed a form for the [facility] several weeks ago to our office to be filled out.  Pt states she needs this sent back to her, NOT faxed back to the [facility].  I advised that there is no documentation of [place] receiving this form, and that I do not see it scanned into pt's chart.  Pt was here yesterday to see CY. Pt has asked I forward this to [place].  Katie please advise if this form has been filled out, or if we need to re-request a new form from the [facility].  Thanks.

## 2015-02-27 NOTE — Telephone Encounter (Signed)
LM for pt x 1  

## 2015-02-27 NOTE — Telephone Encounter (Signed)
Nwe Message  Pt following up on paperwork that she stated was to be filled out by Dr Stanford Breed concerning the [facility]. Please call back and discuss.

## 2015-02-27 NOTE — Telephone Encounter (Signed)
Laroy Apple Hatteberg at 02/27/2015 9:13 AM     Status: Signed       Expand All Collapse All   Nwe Message  Pt following up on paperwork that she stated was to be filled out by Dr Stanford Breed concerning the [facility]. Please call back and discuss.         please call her to let her know about the status of her paper work; may leave message on her machine I told her Dr. Jacalyn Lefevre nurse would be back on Friday.

## 2015-02-28 NOTE — Telephone Encounter (Signed)
Pt is aware that form has been completed and signed by CY. She is aware that I am mailing our to her confirmed address in [place]. Nothing more needed at this time.

## 2015-02-28 NOTE — Telephone Encounter (Signed)
Left messages for pt, paperwrok was placed in the mail Monday this week.

## 2015-02-28 NOTE — Telephone Encounter (Signed)
Patient called, advised this is still being looked in to and she will be called.

## 2015-03-04 ENCOUNTER — Telehealth: Payer: Self-pay | Admitting: Cardiology

## 2015-03-04 NOTE — Telephone Encounter (Signed)
New Message  Pt req a call back to from the nurse. No further details for the message

## 2015-03-05 ENCOUNTER — Telehealth: Payer: Self-pay | Admitting: Cardiology

## 2015-03-05 NOTE — Telephone Encounter (Signed)
No answer 1/18 1042am mj

## 2015-03-05 NOTE — Telephone Encounter (Signed)
Left Voice Message to call back

## 2015-03-05 NOTE — Telephone Encounter (Signed)
Patient had some questions about hardening of the arteries if it caused dementia and wanted to know if there was anything over the counter that she could buy that would help

## 2015-03-05 NOTE — Telephone Encounter (Signed)
Returning call. Please call.

## 2015-03-05 NOTE — Telephone Encounter (Signed)
Returning your call. °

## 2015-03-05 NOTE — Telephone Encounter (Signed)
Patient does not have a device.  Routed to triage pool.

## 2015-03-06 ENCOUNTER — Telehealth: Payer: Self-pay | Admitting: Internal Medicine

## 2015-03-06 NOTE — Telephone Encounter (Signed)
LM x 1 for pt 

## 2015-03-07 NOTE — Telephone Encounter (Signed)
Spoke with pt, states she was following up on a form that was mailed to her last week per [facility] documentation.  Pt states she has not yet received the form.  Pt asked if there was a way we could follow up with the postal service to follow up on the status of this letter.  I advised that we cannot do that.  Pt states she will have this form faxed to our office, to have CY re-fill out out and fax back to the [facility].  Pt states this is time sensitive.    Will hold message to ensure receipt of form.

## 2015-03-07 NOTE — Telephone Encounter (Signed)
lmtcb X2 for pt.  

## 2015-03-07 NOTE — Telephone Encounter (Signed)
Patient is calling back, CB [phone]

## 2015-03-13 ENCOUNTER — Telehealth: Payer: Self-pay | Admitting: Cardiology

## 2015-03-13 NOTE — Telephone Encounter (Signed)
Spoke to patient. Patient states she was watching TV today - a heart docator was giving signs an symptoms for heart condition She states she has a nagging cough for about 3 weeks. Small amt congestion,nothing coming up. Patient states it occurs during the day , does not bother at night, she wear 2 liter at night.  " I have to get 8 hours of sleep at night or I fill fatigue the next day."  RN asked patient several questions - any swelling -  PER PATIENT , right foot a swelling. Able to sleep flat in bed - per patient - yes, sleep 8 hours, with oxygen.no shortness of breath noted. Patient states she is still in wheelchair and getting physical therapy.  Patient wanted to know what she can take for cough-  Throat lozenges , may use  Plain mucinex for congestion If continue contact primary. Patient verbalized understanding.

## 2015-03-13 NOTE — Telephone Encounter (Signed)
Please call,pt says she has a question.

## 2015-03-24 ENCOUNTER — Telehealth: Payer: Self-pay | Admitting: Cardiology

## 2015-03-24 NOTE — Telephone Encounter (Signed)
Pt calling about some swelling around her ankles.  Describes ankles as a little puffy, this seems to get better w/ positioning of legs.  Has been occurring for a while, she does not state any worse over time. No swelling in feet. Pt uses wheelchair, does not ambulate. Encouraged elevation of legs as able, pt states she can prop legs up when at rest.  Pt has f/u in March. Will see if Dr. Stanford Breed has recommendations in meantime. Advised if worse to call.

## 2015-03-24 NOTE — Telephone Encounter (Signed)
Spoke with pt, Aware of dr crenshaw's recommendations.  °

## 2015-03-24 NOTE — Telephone Encounter (Signed)
Fu as scheduled Brian Crenshaw   

## 2015-03-24 NOTE — Telephone Encounter (Signed)
Pt called in stating that she would like to speak with a nurse about some swelling. She would not tell me where the swelling was located. Please f/u with her  Thanks

## 2015-04-17 ENCOUNTER — Ambulatory Visit: Payer: PPO | Admitting: Cardiology

## 2015-04-23 ENCOUNTER — Telehealth: Payer: Self-pay

## 2015-04-23 NOTE — Telephone Encounter (Signed)
Orders received from [name] to contact the patient to update that additional PAP test are not indicated. Attempted to contact the patient , no answer , left a detailed message with call back information provided if additional questions or concerns arise .

## 2015-05-01 ENCOUNTER — Ambulatory Visit: Payer: PPO | Admitting: Cardiology

## 2015-05-09 ENCOUNTER — Ambulatory Visit: Payer: PPO | Admitting: Cardiology

## 2015-05-15 NOTE — Progress Notes (Signed)
HPI: FU coronary artery disease. Patient is status post PCI of her LAD in May of 2011. She had a drug-eluting stent at that time. There was no significant disease in her circumflex or right coronary artery. Her LV function was normal. She has had problems with recurrent atypical chest pain. A Myoview was performed in November of 2011 and showed an EF of 72% and normal perfusion. Repeat cardiac catheterization in May 2012 showed normal LV function with a patent stent in the LAD. No obstructive disease. Chest CT in May of 2012 showed no pulmonary embolus. Carotid Dopplers May 2016 showed 50-69% right stenosis and no left. Follow-up recommended in one year. Since I last saw her,   Current Outpatient Prescriptions  Medication Sig Dispense Refill  . amitriptyline (ELAVIL) 100 MG tablet Take 100 mg by mouth at bedtime.    Marland Kitchen aspirin 81 MG EC tablet Take 81 mg by mouth every morning.     Marland Kitchen atorvastatin (LIPITOR) 40 MG tablet Take 1 tablet (40 mg total) by mouth every morning. (Patient taking differently: Take 80 mg by mouth every morning. ) 90 tablet 3  . Calcium Carbonate-Vit D-Min (CALTRATE 600+D PLUS) 600-400 MG-UNIT per tablet Take 1 tablet by mouth every morning.     . cholecalciferol (VITAMIN D) 1000 UNITS tablet Take 1,000 Units by mouth every morning.     . Cinnamon 500 MG TABS Take 1,000 mg by mouth every morning.     Marland Kitchen ibuprofen (ADVIL,MOTRIN) 200 MG tablet Take 400 mg by mouth daily as needed for moderate pain.    Marland Kitchen LORazepam (ATIVAN) 2 MG tablet Take 2 mg by mouth 2 (two) times daily.     . multivitamin-iron-minerals-folic acid (CENTRUM) chewable tablet Chew 1 tablet by mouth every morning.     . nitroGLYCERIN (NITROSTAT) 0.4 MG SL tablet Place 1 tablet (0.4 mg total) under the tongue every 5 (five) minutes as needed for chest pain. 25 tablet 11  . Omega-3 Fatty Acids (FISH OIL) 1000 MG CAPS Take 1,000 mg by mouth every morning.     . polyethylene glycol powder (GLYCOLAX/MIRALAX) powder  Take 17 g by mouth 2 (two) times daily as needed. 850 g 5  . quinapril (ACCUPRIL) 20 MG tablet Take 1 tablet (20 mg total) by mouth every morning. 90 tablet 3  . Sennosides (SENNA LAX PO) Take 2 tablets by mouth every morning.     No current facility-administered medications for this visit.     Past Medical History  Diagnosis Date  . Hyperlipidemia     takes Atorvastatin daily  . Chronic headache   . Osteoarthritis   . CAD (coronary artery disease)   . Chronic chest pain   . Constipation   . Hypertension     takes Quinapril daily  . Joint pain   . Low back pain   . Constipation     takes Miralax daily  . GERD (gastroesophageal reflux disease)     takes Nexium daily  . History of colon polyps   . Anxiety     takes Ativan daily  . Panic attacks   . Depression     takes Elavil nightly  . Shortness of breath     dyspnea, uses O2 nasal  3 liters/min. at night only  . Ovarian cancer screening 11/12/2013  . Adnexal mass 01/17/2014  . Sleep apnea     , uses O2 , no CPAP  . Malignant neoplasm of breast ([place])  right breast  . Cancer ([place])     lymphoma    Past Surgical History  Procedure Laterality Date  . Cholecystectomy    . Right mastectomy    . Tonsillectomy    . Knee surgery Right   . US echocardiography  10/04/2006    EF 55-60%  . Heart stent  May 2011 per pt  . Portacath placement      x 2  . Inner ear surgery    . Cardiac catheterization  2012  . Port removed    . Esophagogastroduodenoscopy    . Colonoscopy    . Cataract surgery Bilateral   . Submandibular gland excision Left 05/10/2013    Procedure: EXCISION LEFT SUBMANDIBULAR NODE;  Surgeon: Jodi Marble, MD;  Location: [facility];  Service: ENT;  Laterality: Left;  . Portacath placement  06/04/13    Social History   Social History  . Marital Status: Divorced    Spouse Name: N/A  . Number of Children: 2  . Years of Education: N/A   Occupational History  . Unemployed    Social History Main Topics    . Smoking status: Never Smoker   . Smokeless tobacco: Never Used  . Alcohol Use: No  . Drug Use: No  . Sexual Activity: Yes    Birth Control/ Protection: Post-menopausal   Other Topics Concern  . Not on file   Social History Narrative    No family history on file.  ROS: no fevers or chills, productive cough, hemoptysis, dysphasia, odynophagia, melena, hematochezia, dysuria, hematuria, rash, seizure activity, orthopnea, PND, pedal edema, claudication. Remaining systems are negative.  Physical Exam: Well-developed well-nourished in no acute distress.  Skin is warm and dry.  HEENT is normal.  Neck is supple.  Chest is clear to auscultation with normal expansion.  Cardiovascular exam is regular rate and rhythm.  Abdominal exam nontender or distended. No masses palpated. Extremities show no edema. neuro grossly intact  ECG     This encounter was created in error - please disregard.

## 2015-05-16 ENCOUNTER — Telehealth: Payer: Self-pay | Admitting: Internal Medicine

## 2015-05-16 NOTE — Telephone Encounter (Signed)
Called spoke with pt and made aware of below. She did not agree with this and hung the phone up. Will sign off message

## 2015-05-16 NOTE — Telephone Encounter (Signed)
Spoke with pt. States that "[employer]" has been pulling her oxygen off again. This has happened twice in the past week. Pt feels like he is doing this to kill her. To keep him from doing this she would like to switch to CPAP. Pt would like CPAP to come from [facility]. States, "I am not ready to leave this life and if [name] keeps taking off my oxygen I am going to leave this earth."  CY - please advise. Thanks.

## 2015-05-16 NOTE — Telephone Encounter (Signed)
Before, she had complained that CPAP was coming off the same way. CPAP is much harder to put back on. Thus why we changed to nasal oxygen. At least with this, if she discovers that is come off, she can put it back on quickly

## 2015-05-19 ENCOUNTER — Encounter: Payer: PPO | Admitting: Cardiology

## 2015-05-20 ENCOUNTER — Telehealth: Payer: Self-pay | Admitting: Cardiology

## 2015-05-20 ENCOUNTER — Encounter: Payer: Self-pay | Admitting: *Deleted

## 2015-05-20 NOTE — Telephone Encounter (Signed)
Returned call. Pt was already rescheduled for next available. She requested waitlist for later AM appt. I added her for this. She will keep next appt as scheduled unless sooner date w/ more optimal time available.

## 2015-05-20 NOTE — Telephone Encounter (Signed)
NEW MESSAGE   Pt has been sick/ should have went to hospital/cant pay for the ambulance  Pt missed appt 05/19/15/   Pt states she could not get out of bed  Pt suffer from severe stress/medication did not help/family situation is worse  Pt apologizes for missing appt  Pt wants rn to call her

## 2015-05-22 ENCOUNTER — Ambulatory Visit: Payer: PPO | Admitting: Cardiology

## 2015-05-28 ENCOUNTER — Telehealth: Payer: Self-pay | Admitting: Internal Medicine

## 2015-05-28 NOTE — Telephone Encounter (Signed)
Spoke with pt, states "someone" is taking her 02 cannula off her at night and is unsure of when it is being taken off- pt states she is concerned as to what effects going without her supplemental oxygen for an unknown amount of time at night can do to her other body systems.  She states that "someone" isn't taking her 02 off every night, but it's happened "more than once here recently".   Pt is currently prescribed 2lpm qhs.   CY please advise.  Thanks.

## 2015-05-29 NOTE — Telephone Encounter (Signed)
Her body has enough reserve that she can be off her oxygen for a while at night. She just needs to try to keep it on, or put it  back on, as much as she can each night.

## 2015-05-29 NOTE — Telephone Encounter (Signed)
lmtcb for pt.  

## 2015-05-31 NOTE — Progress Notes (Signed)
HPI: FU coronary artery disease. Patient is status post PCI of her LAD in May of 2011. She had a drug-eluting stent at that time. There was no significant disease in her circumflex or right coronary artery. Her LV function was normal. She has had problems with recurrent atypical chest pain. A Myoview was performed in November of 2011 and showed an EF of 72% and normal perfusion. Repeat cardiac catheterization in May 2012 showed normal LV function with a patent stent in the LAD. No obstructive disease. Chest CT in May of 2012 showed no pulmonary embolus. Carotid dopplers 5/16 showed 50-69 right and fu recommended one year. Since I last saw her, the patient denies any dyspnea on exertion, orthopnea, PND, pedal edema, palpitations, syncope or chest pain.   Current Outpatient Prescriptions  Medication Sig Dispense Refill  . amitriptyline (ELAVIL) 100 MG tablet Take 100 mg by mouth at bedtime.    Marland Kitchen aspirin 81 MG EC tablet Take 81 mg by mouth every morning.     Marland Kitchen atorvastatin (LIPITOR) 80 MG tablet Take 80 mg by mouth daily.    . Calcium Carbonate-Vit D-Min (CALTRATE 600+D PLUS) 600-400 MG-UNIT per tablet Take 1 tablet by mouth every morning.     . cholecalciferol (VITAMIN D) 1000 UNITS tablet Take 1,000 Units by mouth every morning.     . Cinnamon 500 MG TABS Take 1,000 mg by mouth every morning.     Marland Kitchen GARLIC PO Take 1 tablet by mouth daily.    Marland Kitchen ibuprofen (ADVIL,MOTRIN) 200 MG tablet Take 400 mg by mouth daily as needed for moderate pain.    Marland Kitchen LORazepam (ATIVAN) 2 MG tablet Take 2 mg by mouth 2 (two) times daily.     . multivitamin-iron-minerals-folic acid (CENTRUM) chewable tablet Chew 1 tablet by mouth every morning.     . nitroGLYCERIN (NITROSTAT) 0.4 MG SL tablet Place 1 tablet (0.4 mg total) under the tongue every 5 (five) minutes as needed for chest pain. 25 tablet 11  . Omega-3 Fatty Acids (FISH OIL) 1000 MG CAPS Take 1,000 mg by mouth every morning.     . polyethylene glycol powder  (GLYCOLAX/MIRALAX) powder Take 17 g by mouth 2 (two) times daily as needed. 850 g 5  . quinapril (ACCUPRIL) 20 MG tablet Take 1 tablet (20 mg total) by mouth every morning. 90 tablet 3  . Sennosides (SENNA LAX PO) Take 2 tablets by mouth every morning.     No current facility-administered medications for this visit.     Past Medical History  Diagnosis Date  . Hyperlipidemia     takes Atorvastatin daily  . Chronic headache   . Osteoarthritis   . CAD (coronary artery disease)   . Chronic chest pain   . Constipation   . Hypertension     takes Quinapril daily  . Joint pain   . Low back pain   . Constipation     takes Miralax daily  . GERD (gastroesophageal reflux disease)     takes Nexium daily  . History of colon polyps   . Anxiety     takes Ativan daily  . Panic attacks   . Depression     takes Elavil nightly  . Shortness of breath     dyspnea, uses O2 nasal  3 liters/min. at night only  . Ovarian cancer screening 11/12/2013  . Adnexal mass 01/17/2014  . Sleep apnea     , uses O2 , no CPAP  . Malignant  neoplasm of breast ([place])     right breast  . Cancer ([place])     lymphoma    Past Surgical History  Procedure Laterality Date  . Cholecystectomy    . Right mastectomy    . Tonsillectomy    . Knee surgery Right   . US echocardiography  10/04/2006    EF 55-60%  . Heart stent  May 2011 per pt  . Portacath placement      x 2  . Inner ear surgery    . Cardiac catheterization  2012  . Port removed    . Esophagogastroduodenoscopy    . Colonoscopy    . Cataract surgery Bilateral   . Submandibular gland excision Left 05/10/2013    Procedure: EXCISION LEFT SUBMANDIBULAR NODE;  Surgeon: Jodi Marble, MD;  Location: [facility];  Service: ENT;  Laterality: Left;  . Portacath placement  06/04/13    Social History   Social History  . Marital Status: Divorced    Spouse Name: N/A  . Number of Children: 2  . Years of Education: N/A   Occupational History  . Unemployed     Social History Main Topics  . Smoking status: Never Smoker   . Smokeless tobacco: Never Used  . Alcohol Use: No  . Drug Use: No  . Sexual Activity: Yes    Birth Control/ Protection: Post-menopausal   Other Topics Concern  . Not on file   Social History Narrative    History reviewed. No pertinent family history.  ROS: no fevers or chills, productive cough, hemoptysis, dysphasia, odynophagia, melena, hematochezia, dysuria, hematuria, rash, seizure activity, orthopnea, PND, pedal edema, claudication. Remaining systems are negative.  Physical Exam: Well-developed well-nourished in no acute distress.  Skin is warm and dry.  HEENT is normal.  Neck is supple.  Chest is clear to auscultation with normal expansion.  Cardiovascular exam is regular rate and rhythm.  Abdominal exam nontender or distended. No masses palpated. Extremities show no edema. neuro grossly intact  ECG Sinus rhythm at a rate of 97. Left ventricular hypertrophy.

## 2015-06-02 ENCOUNTER — Telehealth: Payer: Self-pay | Admitting: Cardiology

## 2015-06-02 ENCOUNTER — Ambulatory Visit (INDEPENDENT_AMBULATORY_CARE_PROVIDER_SITE_OTHER): Payer: PPO | Admitting: Cardiology

## 2015-06-02 ENCOUNTER — Encounter: Payer: Self-pay | Admitting: Cardiology

## 2015-06-02 VITALS — BP 110/62 | HR 97 | Ht 62.0 in | Wt 170.0 lb

## 2015-06-02 DIAGNOSIS — I679 Cerebrovascular disease, unspecified: Secondary | ICD-10-CM

## 2015-06-02 DIAGNOSIS — E782 Mixed hyperlipidemia: Secondary | ICD-10-CM | POA: Diagnosis not present

## 2015-06-02 DIAGNOSIS — I2510 Atherosclerotic heart disease of native coronary artery without angina pectoris: Secondary | ICD-10-CM | POA: Diagnosis not present

## 2015-06-02 MED ORDER — ATORVASTATIN CALCIUM 80 MG PO TABS: 80.0000 mg | ORAL_TABLET | Freq: Every day | ORAL | Status: AC

## 2015-06-02 MED ORDER — QUINAPRIL HCL 20 MG PO TABS: 20.0000 mg | ORAL_TABLET | Freq: Every morning | ORAL | Status: DC

## 2015-06-02 NOTE — Telephone Encounter (Signed)
[phone] calling back

## 2015-06-02 NOTE — Telephone Encounter (Signed)
New message   Pt calling to have rn call  i asked for reason for call: she states that she forgot to ask some questions while she was int he office  She would not tell me anything more

## 2015-06-02 NOTE — Telephone Encounter (Signed)
Spoke with pt, she forgot to tell dr Stanford Breed, she had a colonoscopy with dr Earlean Shawl. 2 polyps were removed and repeat will be in 5 years. She is a 15 yr breast ca survivor and 58 month lymphoma survivor. Will make dr Stanford Breed aware

## 2015-06-02 NOTE — Assessment & Plan Note (Signed)
Schedule follow-up carotid Dopplers May 2017.

## 2015-06-02 NOTE — Assessment & Plan Note (Signed)
Blood pressure controlled. Continue present medications. 

## 2015-06-02 NOTE — Telephone Encounter (Signed)
Forward to [employer]

## 2015-06-02 NOTE — Telephone Encounter (Signed)
lmtcb x1 for pt. 

## 2015-06-02 NOTE — Assessment & Plan Note (Signed)
Continue aspirin and statin. 

## 2015-06-02 NOTE — Patient Instructions (Signed)
Medication Instructions:   Refill sent to the pharmacy electronically.   Testing/Procedures:  Your physician has requested that you have a carotid duplex. This test is an ultrasound of the carotid arteries in your neck. It looks at blood flow through these arteries that supply the brain with blood. Allow one hour for this exam. There are no restrictions or special instructions.DUE IN MAY    Follow-Up:  Your physician wants you to follow-up in: [place] will receive a reminder letter in the mail two months in advance. If you don't receive a letter, please call our office to schedule the follow-up appointment.   If you need a refill on your cardiac medications before your next appointment, please call your pharmacy.

## 2015-06-02 NOTE — Assessment & Plan Note (Signed)
Continue statin. 

## 2015-06-03 NOTE — Telephone Encounter (Signed)
Spoke with pt, relayed below recs to pt.  Pt expressed understanding.  Nothing further needed.

## 2015-06-06 ENCOUNTER — Telehealth: Payer: Self-pay | Admitting: Cardiology

## 2015-06-06 NOTE — Telephone Encounter (Signed)
Busy signal. No answer.

## 2015-06-06 NOTE — Telephone Encounter (Signed)
New message     Patient calling wants to ask nurse several questions.  Did not want to disclose any information.    No chest pain no sob.

## 2015-06-09 ENCOUNTER — Encounter: Payer: Self-pay | Admitting: Cardiology

## 2015-06-09 NOTE — Telephone Encounter (Signed)
Spoke with patient. She is "suffering from severe stress". "At the moment she cannot think of why she called in" wanting to speak with Hilda Blades, RN.  Patient asked about her appointment scheduling - asked about her 4/3 appt (was a no show), and on 4/6 appt was scheduled and then cancelled and patient was seen on 4/17.  Patient has financial difficulties with getting to appts - has to use [facility] or taxi Patient asked about her appointment on May 2nd for her carotid doppler - wanted to confirm the time to make sure she had arrangements for transportation.  Patient thanked Therapist, sports for answering questions. No further action needed.

## 2015-06-09 NOTE — Telephone Encounter (Signed)
F/u  Pt requesting to speak w/Debra. Please call back and discuss.

## 2015-06-09 NOTE — Telephone Encounter (Signed)
New Message  Pt request a call back. No further details provided per pt request

## 2015-06-09 NOTE — Telephone Encounter (Signed)
F/u  Pt calling about possible breathing exercises. Please call back and discuss.

## 2015-06-09 NOTE — Telephone Encounter (Signed)
This encounter was created in error - please disregard.

## 2015-06-09 NOTE — Telephone Encounter (Signed)
LMTCB

## 2015-06-10 ENCOUNTER — Other Ambulatory Visit (HOSPITAL_COMMUNITY): Payer: Self-pay | Admitting: Family Medicine

## 2015-06-10 DIAGNOSIS — Z8572 Personal history of non-Hodgkin lymphomas: Secondary | ICD-10-CM

## 2015-06-12 ENCOUNTER — Telehealth: Payer: Self-pay | Admitting: Cardiology

## 2015-06-12 NOTE — Telephone Encounter (Signed)
Spoke with pt, yesterday she had some elbow and heart pain. It only lasted a few seconds and then went away. No other related symptoms, today she is fine. Questions regarding upcoming carotid dopplers answered. She will call back if she cont to have problems with pain.

## 2015-06-12 NOTE — Telephone Encounter (Signed)
Pt calling re having pain yesterday in left arm-off and on-pt wouldn't give me any more info because she said if she did she wouldn't have any thing to talk to the nurse about-pls call

## 2015-06-16 ENCOUNTER — Telehealth: Payer: Self-pay | Admitting: Cardiology

## 2015-06-16 NOTE — Telephone Encounter (Signed)
New Message  Pt has questions specific to the Carotid Artery. Please call back and discuss.

## 2015-06-16 NOTE — Telephone Encounter (Signed)
Returned call to pt. She had questions about why she was getting her carotid dopplers and if she would need surgery. I discussed with her that Dr Stanford Breed needs to see the result of the procedure first and then he can advise her. She verbalized understanding.

## 2015-06-17 ENCOUNTER — Telehealth: Payer: Self-pay | Admitting: Hematology and Oncology

## 2015-06-17 ENCOUNTER — Ambulatory Visit (HOSPITAL_COMMUNITY)
Admission: RE | Admit: 2015-06-17 | Discharge: 2015-06-17 | Disposition: A | Discharge status: Home / Self Care | Payer: PPO | Source: Ambulatory Visit | Attending: Cardiology | Admitting: Cardiology

## 2015-06-17 ENCOUNTER — Other Ambulatory Visit: Payer: Self-pay | Admitting: *Deleted

## 2015-06-17 DIAGNOSIS — I6523 Occlusion and stenosis of bilateral carotid arteries: Secondary | ICD-10-CM | POA: Insufficient documentation

## 2015-06-17 DIAGNOSIS — E785 Hyperlipidemia, unspecified: Secondary | ICD-10-CM | POA: Diagnosis not present

## 2015-06-17 DIAGNOSIS — I679 Cerebrovascular disease, unspecified: Secondary | ICD-10-CM | POA: Diagnosis present

## 2015-06-17 DIAGNOSIS — K219 Gastro-esophageal reflux disease without esophagitis: Secondary | ICD-10-CM | POA: Diagnosis not present

## 2015-06-17 DIAGNOSIS — I10 Essential (primary) hypertension: Secondary | ICD-10-CM | POA: Diagnosis not present

## 2015-06-17 NOTE — Telephone Encounter (Signed)
lvm for pt regarding to lab appt added on 5.4

## 2015-06-19 ENCOUNTER — Other Ambulatory Visit: Payer: PPO

## 2015-06-19 ENCOUNTER — Ambulatory Visit (HOSPITAL_COMMUNITY): Payer: PPO

## 2015-06-26 ENCOUNTER — Ambulatory Visit (HOSPITAL_COMMUNITY): Payer: PPO

## 2015-06-30 ENCOUNTER — Encounter (HOSPITAL_COMMUNITY)
Admission: RE | Admit: 2015-06-30 | Discharge: 2015-06-30 | Disposition: A | Discharge status: Home / Self Care | Payer: PPO | Source: Ambulatory Visit | Attending: Family Medicine | Admitting: Family Medicine

## 2015-06-30 DIAGNOSIS — Z8572 Personal history of non-Hodgkin lymphomas: Secondary | ICD-10-CM | POA: Insufficient documentation

## 2015-06-30 LAB — GLUCOSE, CAPILLARY: Glucose-Capillary: 94 mg/dL (ref 65–99)

## 2015-06-30 MED ORDER — FLUDEOXYGLUCOSE F - 18 (FDG) INJECTION
8.7000 | Freq: Once | INTRAVENOUS | Status: AC | PRN
  Administered 2015-06-30: 8.7 via INTRAVENOUS

## 2015-07-01 ENCOUNTER — Encounter: Payer: Self-pay | Admitting: Cardiology

## 2015-07-01 NOTE — Telephone Encounter (Signed)
New message    Patient calling - did not disclose any information to me - will discuss when the nurse call back.     Ask patient is if she was having chest pain  - no

## 2015-07-01 NOTE — Telephone Encounter (Signed)
No answer, unable to leave message °

## 2015-07-03 NOTE — Telephone Encounter (Signed)
Left message for pt to call.

## 2015-07-04 ENCOUNTER — Telehealth: Payer: Self-pay | Admitting: Hematology and Oncology

## 2015-07-04 NOTE — Telephone Encounter (Signed)
Pt called to cx appt...did not want to r/s at this time

## 2015-07-08 ENCOUNTER — Encounter: Payer: Self-pay | Admitting: Hematology & Oncology

## 2015-07-08 ENCOUNTER — Encounter: Payer: Self-pay | Admitting: Cardiology

## 2015-07-08 NOTE — Telephone Encounter (Signed)
No answer. Left message to call back.   

## 2015-07-08 NOTE — Telephone Encounter (Signed)
This encounter was created in error - please disregard.

## 2015-07-08 NOTE — Telephone Encounter (Signed)
Pt only wants to talk to Hilda Blades Mathis-said it's not urgent and will wait for Debra's call

## 2015-07-08 NOTE — Telephone Encounter (Signed)
New Message  Pt requested to speak w/ RN- did not specify nature of phone cal.. Please call back and discuss.

## 2015-07-10 ENCOUNTER — Ambulatory Visit (HOSPITAL_COMMUNITY): Payer: PPO

## 2015-07-10 ENCOUNTER — Encounter: Payer: Self-pay | Admitting: Hematology & Oncology

## 2015-07-10 ENCOUNTER — Ambulatory Visit (HOSPITAL_BASED_OUTPATIENT_CLINIC_OR_DEPARTMENT_OTHER): Payer: PPO | Admitting: Hematology & Oncology

## 2015-07-10 ENCOUNTER — Ambulatory Visit (HOSPITAL_BASED_OUTPATIENT_CLINIC_OR_DEPARTMENT_OTHER): Payer: PPO

## 2015-07-10 ENCOUNTER — Other Ambulatory Visit: Payer: Medicare Other

## 2015-07-10 ENCOUNTER — Other Ambulatory Visit (HOSPITAL_BASED_OUTPATIENT_CLINIC_OR_DEPARTMENT_OTHER): Payer: PPO

## 2015-07-10 VITALS — BP 117/64 | HR 89 | Temp 98.0°F | Resp 14 | Ht 62.0 in | Wt 170.0 lb

## 2015-07-10 DIAGNOSIS — Z853 Personal history of malignant neoplasm of breast: Secondary | ICD-10-CM

## 2015-07-10 DIAGNOSIS — T386X5A Adverse effect of antigonadotrophins, antiestrogens, antiandrogens, not elsewhere classified, initial encounter: Principal | ICD-10-CM

## 2015-07-10 DIAGNOSIS — M818 Other osteoporosis without current pathological fracture: Secondary | ICD-10-CM

## 2015-07-10 DIAGNOSIS — Z8572 Personal history of non-Hodgkin lymphomas: Secondary | ICD-10-CM

## 2015-07-10 DIAGNOSIS — C8330 Diffuse large B-cell lymphoma, unspecified site: Secondary | ICD-10-CM

## 2015-07-10 DIAGNOSIS — C8580 Other specified types of non-Hodgkin lymphoma, unspecified site: Secondary | ICD-10-CM

## 2015-07-10 LAB — CBC WITH DIFFERENTIAL (CANCER CENTER ONLY)
BASO#: 0 10*3/uL (ref 0.0–0.2)
BASO%: 0.3 % (ref 0.0–2.0)
EOS ABS: 0.1 10*3/uL (ref 0.0–0.5)
EOS%: 1.4 % (ref 0.0–7.0)
HEMATOCRIT: 39.1 % (ref 34.8–46.6)
HEMOGLOBIN: 13.2 g/dL (ref 11.6–15.9)
LYMPH#: 2 10*3/uL (ref 0.9–3.3)
LYMPH%: 21.7 % (ref 14.0–48.0)
MCH: 28.6 pg (ref 26.0–34.0)
MCHC: 33.8 g/dL (ref 32.0–36.0)
MCV: 85 fL (ref 81–101)
MONO#: 0.7 10*3/uL (ref 0.1–0.9)
MONO%: 8 % (ref 0.0–13.0)
NEUT%: 68.6 % (ref 39.6–80.0)
NEUTROS ABS: 6.2 10*3/uL (ref 1.5–6.5)
Platelets: 278 10*3/uL (ref 145–400)
RBC: 4.62 10*6/uL (ref 3.70–5.32)
RDW: 16.3 % — ABNORMAL HIGH (ref 11.1–15.7)
WBC: 9 10*3/uL (ref 3.9–10.0)

## 2015-07-10 LAB — COMPREHENSIVE METABOLIC PANEL (CC13)
ALK PHOS: 249 IU/L — AB (ref 39–117)
ALT: 33 IU/L — AB (ref 0–32)
AST: 25 IU/L (ref 0–40)
Albumin, Serum: 3.9 g/dL (ref 3.5–4.8)
Albumin/Globulin Ratio: 1.4 (ref 1.2–2.2)
BILIRUBIN TOTAL: 0.5 mg/dL (ref 0.0–1.2)
BUN/Creatinine Ratio: 19 (ref 12–28)
BUN: 15 mg/dL (ref 8–27)
CHLORIDE: 101 mmol/L (ref 96–106)
Calcium, Ser: 9.6 mg/dL (ref 8.7–10.3)
Carbon Dioxide, Total: 26 mmol/L (ref 18–29)
Creatinine, Ser: 0.81 mg/dL (ref 0.57–1.00)
GFR calc Af Amer: 80 mL/min/{1.73_m2} (ref >59)
GFR calc non Af Amer: 70 mL/min/{1.73_m2} (ref >59)
GLUCOSE: 90 mg/dL (ref 65–99)
Globulin, Total: 2.7 g/dL (ref 1.5–4.5)
Potassium, Ser: 4.3 mmol/L (ref 3.5–5.2)
Sodium: 135 mmol/L (ref 134–144)
TOTAL PROTEIN: 6.6 g/dL (ref 6.0–8.5)

## 2015-07-10 NOTE — Progress Notes (Signed)
Hematology and Oncology Follow Up Visit  [name] [id number] 1935/08/31 80 y.o. 07/10/2015   Principle Diagnosis:   Diffuse large cell non-Hodgkin's lymphoma-treated with possible relapse  Remote history of stage I/II lobular ca of the right breast - ER+/HER2 -  Current Therapy:    Observation     Interim History:  [name] is back for first office visit. Because of logistics, she wants to come out to see [place] at the [place]. She has a remote history of lobular carcinoma of the right breast. This was back in 2002. I don't think she received any chemotherapy for this.  She now was diagnosed with a diffuse large cell non-Hodgkin's lymphoma. This is diagnosed back in March 2015. She had a submandibular lymph node biopsied. From the notes that I seen, she was "stage III". I'm not sure what her IPI score was.  She is treated with chemotherapy with 6 cycles of Rituxan/bendamustine. Her performance status has not been that great so she was not ill be treated with highly aggressive therapy.  She has been in remission. However, a recent PET scan was done which seemed to show some activity in upper abdominal lymph nodes and bilateral hilar lymph nodes.  She's been totally asymptomatic. She also had some endometrial thickening. She is going to see a gynecologist.  She's had no fever. She has had no shortness of breath. There's been no chest wall pain.  She comes in a wheelchair. She has a performance status of ECOG 2-3 at best.  She is eating okay. No nausea or vomiting per is no change in bowel or bladder habits.  She's had no rashes.  She has had no weight loss.  There's been no bleeding.  Medications:  Current outpatient prescriptions:  .  amitriptyline (ELAVIL) 100 MG tablet, Take 100 mg by mouth at bedtime., Disp: , Rfl:  .  aspirin 81 MG EC tablet, Take 81 mg by mouth every morning. , Disp: , Rfl:  .  atorvastatin (LIPITOR) 80 MG tablet, Take 1 tablet  (80 mg total) by mouth daily., Disp: 90 tablet, Rfl: 3 .  Calcium Carbonate-Vit D-Min (CALTRATE 600+D PLUS) 600-400 MG-UNIT per tablet, Take 1 tablet by mouth every morning. , Disp: , Rfl:  .  cholecalciferol (VITAMIN D) 1000 UNITS tablet, Take 1,000 Units by mouth every morning. , Disp: , Rfl:  .  Cinnamon 500 MG TABS, Take 1,000 mg by mouth every morning. , Disp: , Rfl:  .  GARLIC PO, Take 1 tablet by mouth daily., Disp: , Rfl:  .  ibuprofen (ADVIL,MOTRIN) 200 MG tablet, Take 400 mg by mouth daily as needed for moderate pain., Disp: , Rfl:  .  LORazepam (ATIVAN) 2 MG tablet, Take 2 mg by mouth 2 (two) times daily. , Disp: , Rfl:  .  multivitamin-iron-minerals-folic acid (CENTRUM) chewable tablet, Chew 1 tablet by mouth every morning. , Disp: , Rfl:  .  nitroGLYCERIN (NITROSTAT) 0.4 MG SL tablet, Place 1 tablet (0.4 mg total) under the tongue every 5 (five) minutes as needed for chest pain., Disp: 25 tablet, Rfl: 11 .  Omega-3 Fatty Acids (FISH OIL) 1000 MG CAPS, Take 1,000 mg by mouth every morning. , Disp: , Rfl:  .  polyethylene glycol powder (GLYCOLAX/MIRALAX) powder, Take 17 g by mouth 2 (two) times daily as needed., Disp: 850 g, Rfl: 5 .  quinapril (ACCUPRIL) 20 MG tablet, Take 1 tablet (20 mg total) by mouth every morning., Disp: 90 tablet, Rfl:  3 .  Sennosides (SENNA LAX PO), Take 2 tablets by mouth every morning., Disp: , Rfl:   Allergies: No Known Allergies  Past Medical History, Surgical history, Social history, and Family History were reviewed and updated.  Review of Systems: As above  Physical Exam:  height is '5\' 2"'$  (1.575 m) and weight is 170 lb (77.111 kg). Her oral temperature is 98 F (36.7 C). Her blood pressure is 117/64 and her pulse is 89. Her respiration is 14.   Wt Readings from Last 3 Encounters:  07/10/15 170 lb (77.111 kg)  06/02/15 170 lb (77.111 kg)  06/07/14 150 lb (68.04 kg)     Obese white female in no obvious distress. Head and neck exam shows no  ocular or oral lesions. She has no palpable cervical or supraclavicular lymph nodes. Lungs are with some decrease at the bases. Cardiac exam regular rate and rhythm with no murmurs, rubs or bruits. Axillary exam shows no bilateral axillary adenopathy. Abdomen is soft. She is obese. She has no fluid wave. There is no guarding or rebound tenderness. There is no palpable liver or spleen tip. Extremities shows some chronic nonpitting edema of her legs. Neurological exam is nonfocal. Skin exam shows no rashes, ecchymoses or petechia.  Lab Results  Component Value Date   WBC 9.0 07/10/2015   HGB 13.2 07/10/2015   HCT 39.1 07/10/2015   MCV 85 07/10/2015   PLT 278 07/10/2015     Chemistry      Component Value Date/Time   NA 140 02/04/2015 1028   NA 140 05/21/2014 1630   K 4.4 02/04/2015 1028   K 4.0 05/21/2014 1630   CL 103 05/21/2014 1630   CL 107 04/05/2012 1158   CO2 28 02/04/2015 1028   CO2 26 05/21/2014 1630   BUN 13.6 02/04/2015 1028   BUN 15 05/21/2014 1630   CREATININE 0.9 02/04/2015 1028   CREATININE 0.72 05/21/2014 1630   CREATININE 0.80 04/18/2014 1704      Component Value Date/Time   CALCIUM 9.7 02/04/2015 1028   CALCIUM 9.6 05/21/2014 1630   ALKPHOS 224* 02/04/2015 1028   ALKPHOS 229* 05/21/2014 1630   AST 20 02/04/2015 1028   AST 22 05/21/2014 1630   ALT 31 02/04/2015 1028   ALT 29 05/21/2014 1630   BILITOT 0.99 02/04/2015 1028   BILITOT 0.8 05/21/2014 1630         Impression and Plan: [name] is a 80 year old white female. She's not in the best shape. She has diffuse large cell non-Hodgkin's lymphoma. She was treated with 6 cycles of Rituxan/bendamustine. This was very reasonable. It is apparent that she did not have a great performance status that would allow for aggressive therapy.  If she does have recurrent disease, I would not leave that she is curable. She is not a candidate for any stem cell transplant. She is not a candidate for highly aggressive  chemotherapy.  As she is asymptomatic right now, and we really don't have anything to biopsy easily, I think it would be reasonable to repeat a PET scan in 6 weeks. If this is truly recurrent large cell lymphoma, then this should be growth says that we might be oh to get a biopsy.  Thankfully her labs look okay right now. I do not have her LDH back. I do not have a beta-2 microglobulin back.  Prevacid about an hour with her. She is very nice. It was a pleasure talking to her. She has a strong faith.  I did give her a prayer blanket. She was very thankful for this.  Again, we will get a PET scan on her in July.  I think our evaluation and management recommendations will be totally driven by her performance status.   Volanda Napoleon, MD 5/25/20175:16 PM

## 2015-07-11 LAB — BETA 2 MICROGLOBULIN, SERUM: BETA 2: 2.6 mg/L — AB (ref 0.6–2.4)

## 2015-07-11 LAB — VITAMIN D 25 HYDROXY (VIT D DEFICIENCY, FRACTURES): Vitamin D, 25-Hydroxy: 46.9 ng/mL (ref 30.0–100.0)

## 2015-07-11 LAB — LACTATE DEHYDROGENASE: LDH: 192 U/L (ref 125–245)

## 2015-07-15 ENCOUNTER — Ambulatory Visit: Payer: Medicare Other | Admitting: Hematology and Oncology

## 2015-07-15 NOTE — Telephone Encounter (Signed)
This encounter was created in error - please disregard.

## 2015-07-15 NOTE — Telephone Encounter (Signed)
Left message for pt to call.

## 2015-07-21 ENCOUNTER — Encounter: Payer: Self-pay | Admitting: Cardiology

## 2015-07-21 NOTE — Telephone Encounter (Signed)
Patient only wanted to speak with Luisa Dago RN Advised patient Hilda Blades out of the office today but she prefers waiting for her to call back.  Will forward to [employer]

## 2015-07-21 NOTE — Telephone Encounter (Signed)
New Message  Pt called states that she has some information for Dr. Jacalyn Lefevre nurse. No further detail

## 2015-07-22 ENCOUNTER — Telehealth: Payer: Self-pay | Admitting: *Deleted

## 2015-07-22 NOTE — Telephone Encounter (Signed)
This encounter was created in error - please disregard.

## 2015-07-22 NOTE — Telephone Encounter (Signed)
New Message  Pt requested to speak to RN. Pt did not want to disclose information about the call. Pt just stated it was about some information she was told to gather for the RN. Please call back to advise.

## 2015-07-22 NOTE — Telephone Encounter (Signed)
Patient wants Dr Marin Olp to know that she has seen a new GYN MD. She is now seeing Dr Radene Knee. She has a transvaginal US scheduled for Monday, June 15th. She is desiring to have a hysterectomy.   All information relayed to Dr Marin Olp. We will see her as already scheduled on July 12th.

## 2015-07-29 ENCOUNTER — Telehealth: Payer: Self-pay | Admitting: *Deleted

## 2015-07-29 ENCOUNTER — Encounter: Payer: Self-pay | Admitting: Cardiology

## 2015-07-29 NOTE — Telephone Encounter (Signed)
Left message for pt will call her back Thursday or Friday.

## 2015-07-29 NOTE — Telephone Encounter (Signed)
Returned call. Pt specified she would like Debra to call when it is convenient for her. Msg routed.

## 2015-07-29 NOTE — Telephone Encounter (Signed)
Patient calling the office wanting a recommendation for new GYN. She has recently seen Dr Radene Knee but felt like he wasn't thourough enough. Spoke with Dr Marin Olp who reccommended Dr Ammie Ferrier.   Patient then stated she wanted to be seen by a GYN-Oncologist, such as Dr Fermin Schwab at [facility]. Explained that Dr Aldean Ast sees patient's once they have a gyn-cancer diagnosis and does not provide routine gyn care. Patient responded by stating she felt like she did have ovarian cancer. Patient stated Dr Radene Knee felt like her [place] didn't demonstrate this, but did take a biopsy. She was informed that if the biopsy came back positive, we could refer her to Fermin Schwab.  Patient took information on Dr Ammie Ferrier and will call her to set up an appointment for a second opinion.

## 2015-07-29 NOTE — Telephone Encounter (Signed)
New Message   Pt requesting a call when convenience. Pt wants to ask a question about carotid arteries. Please call back to discuss

## 2015-07-30 ENCOUNTER — Telehealth: Payer: Self-pay | Admitting: *Deleted

## 2015-07-30 NOTE — Telephone Encounter (Signed)
Patient calling the office asking about her need to be referred to a GYN-Oncologist. She also wants to know which doctor would be able to treat her concurrently for "stomach" cancer and lymphoma.  Explained to patient that until her uterine biopsy came back, we could not assume that she has a secondary primary. Also explained that she would not concurrently receive treatment for two primary cancers as this would be too toxic. Once again educated patient that we needed to wait until biopsy results returned before any additional plans could be made, or any decisions about chemo or surgery could be made. She seemed to understand the need to wait.   She also stated she had requested Dr Sherran Needs office to fax this office results of biopsy once they returned.   All info reviewed with Dr Marin Olp.

## 2015-07-30 NOTE — Telephone Encounter (Signed)
Patient now calling stating that she doesn't think that the person who completed her [place] at Dr Ophelia Charter office scanned both her ovaries. She would like to know what this office can do.   We have not yet received a copy of the [place], so interpretation can not be reviewed. Patient assured that scan was most likely complete, but that if she was still concerned she should follow up with that office.

## 2015-07-30 NOTE — Telephone Encounter (Signed)
Patient wants to know if the PET scan should be moved up since "I now have two cancers I need to worry about"  Again reviewed with patient that it can not be assumed that she have an additional primary cancer until the biopsy results come back. Again reviewed that results should be back some time next week and at that time decisions could be made about any additional scans, medications or referrals.   Will contact the patient once biopsy results have been received.

## 2015-08-01 ENCOUNTER — Telehealth: Payer: Self-pay | Admitting: *Deleted

## 2015-08-01 NOTE — Telephone Encounter (Signed)
Patient called to let the office know that her biopsy came back negative. She is scheduled for a D&C on July 3rd. She is also trying to see if they will change this to a hysterectomy. She will let us know.  She confirmed the timing for her MRI. I confirmed that the MRI was scheduled approximately 6 weeks from the appointment time with Dr Marin Olp. She understood.

## 2015-08-01 NOTE — Telephone Encounter (Signed)
This encounter was created in error - please disregard.

## 2015-08-04 ENCOUNTER — Telehealth: Payer: Self-pay | Admitting: Pulmonary Disease

## 2015-08-04 ENCOUNTER — Telehealth: Payer: Self-pay | Admitting: Cardiology

## 2015-08-04 NOTE — Telephone Encounter (Signed)
Pt has question she wants to ask regarding her carotid artery -pls call [phone]

## 2015-08-04 NOTE — Telephone Encounter (Signed)
Pt is followed by Dr. Annamaria Boots.  She uses supplemental oxygen at night for hx of OSA intolerant of CPAP.  She says she noticed a red light in the middle of her oxygen machine that she wasn't familiar with.  She called [facility] and was told someone would call her back to walk her through how to fix this problem.  She hasn't heard back from [facility].  I advised her to try calling [facility] back.  I will route to Dr. Annamaria Boots and [facility] to follow up on with [facility] and Pt.

## 2015-08-04 NOTE — Telephone Encounter (Signed)
Spoke with pt, questions regarding carotid dopplers answered.

## 2015-08-04 NOTE — Telephone Encounter (Signed)
I called [facility] & left vm for Jiles Crocker to have someone to call the pt to help her with the problem with her machine.

## 2015-08-08 ENCOUNTER — Encounter (HOSPITAL_BASED_OUTPATIENT_CLINIC_OR_DEPARTMENT_OTHER): Payer: Self-pay | Admitting: *Deleted

## 2015-08-14 NOTE — H&P (Signed)
  Patient name [name], [place] DICTATION# [id number] CSN# [id number]  Darlyn Chamber, MD 08/14/2015 10:59 AM

## 2015-08-14 NOTE — H&P (Signed)
NAMESHIMEKA, ACORS NO.:  [id number]  MEDICAL RECORD NO.:  [id number]  LOCATION:                                 FACILITY:  PHYSICIAN:  Darlyn Chamber, M.D.   DATE OF BIRTH:  November 01, 1935  DATE OF ADMISSION:  08/26/2015 DATE OF DISCHARGE:                             HISTORY & PHYSICAL   The date of her surgery is August 26, 2015, at [facility] outpatient area on [facility].  HISTORY OF PRESENT ILLNESS:  The patient is a 80 year old postmenopausal patient, referred by Dr. Marin Olp for evaluation of endometrial thickness.  She had had a PET scan that revealed a thickening of endometrial stripe.  Subsequently underwent a saline infusion ultrasound here in the office.  She did have a thickened endometrium.  Also had a left ovarian cyst that evidently she has had for some time and had been followed in the past by Dr. Ubaldo Glassing.  We did an endometrial sampling. Pathology revealed benign endometrial-type polyp.  No abnormalities were noted, but because of thickness, she now presents for hysteroscopic evaluation with resection.  In terms of allergies, she has no known drug allergies.  MEDICATIONS:  She is on Lipitor, amitriptyline, lorazepam and quinapril.  PAST MEDICAL HISTORY:  Significant for she has a history of lymphoma. Does have a history of irritable bowel syndrome, heart disease and arthritis.  No surgical history noted.  FAMILY HISTORY:  Noncontributory.  SOCIAL HISTORY:  Reveals no tobacco or alcohol use.  REVIEW OF SYSTEMS:  Noncontributory.  PHYSICAL EXAMINATION:  VITAL SIGNS:  The patient is afebrile with stable vital signs. HEENT:  The patient is normocephalic.  Pupils are equal, round and reactive to light and accommodation.  Extraocular movements were intact. Sclerae and conjunctivae were clear.  Oropharynx clear. NECK:  Not examined. BREASTS:  Not examined. LUNGS:  Clear. CARDIOVASCULAR:  Regular rate.  No murmurs or gallops.  No  carotid or abdominal bruits. ABDOMEN:  Benign. PELVIC:  Normal external genitalia.  Vaginal cuff is clear.  Cervix unremarkable.  Uterus normal size, shape and contour.  Adnexa free of mass or tenderness. EXTREMITIES:  Trace edema. NEUROLOGIC:  Grossly within normal limits.  IMPRESSION: 1. Abnormal endometrial thickening. 2. Ovarian cyst.  PLAN:  The patient undergo hysteroscopic evaluation with resection of endometrium.  The nature of the procedure have been discussed.  Risks and benefits have been discussed.  The risks include the risk of infection, the risk of hemorrhage that could require transfusion with the risk of AIDS or hepatitis, excessive bleeding could require hysterectomy.  Risk of injury to adjacent organs including bladder, bowel or ureters that could require further exploratory surgery.  Risk of deep venous thrombosis and pulmonary embolus.  The patient voiced understanding of indications and risks.     Darlyn Chamber, M.D.   ______________________________ Darlyn Chamber, M.D.    JSM/MEDQ  D:  08/14/2015  T:  08/14/2015  Job:  [id number]

## 2015-08-15 ENCOUNTER — Encounter (HOSPITAL_BASED_OUTPATIENT_CLINIC_OR_DEPARTMENT_OTHER): Payer: Self-pay | Admitting: *Deleted

## 2015-08-18 ENCOUNTER — Emergency Department (HOSPITAL_COMMUNITY): Payer: PPO

## 2015-08-18 ENCOUNTER — Encounter (HOSPITAL_COMMUNITY): Payer: Self-pay | Admitting: Emergency Medicine

## 2015-08-18 ENCOUNTER — Inpatient Hospital Stay (HOSPITAL_COMMUNITY)
Admission: EM | Admit: 2015-08-18 | Discharge: 2015-09-01 | DRG: 871 | Disposition: A | Discharge status: Skilled Nursing Facility | Payer: PPO | Attending: Internal Medicine | Admitting: Internal Medicine

## 2015-08-18 DIAGNOSIS — Z993 Dependence on wheelchair: Secondary | ICD-10-CM

## 2015-08-18 DIAGNOSIS — K449 Diaphragmatic hernia without obstruction or gangrene: Secondary | ICD-10-CM | POA: Diagnosis present

## 2015-08-18 DIAGNOSIS — Z95828 Presence of other vascular implants and grafts: Secondary | ICD-10-CM

## 2015-08-18 DIAGNOSIS — Z6837 Body mass index (BMI) 37.0-37.9, adult: Secondary | ICD-10-CM

## 2015-08-18 DIAGNOSIS — G4730 Sleep apnea, unspecified: Secondary | ICD-10-CM

## 2015-08-18 DIAGNOSIS — A419 Sepsis, unspecified organism: Secondary | ICD-10-CM | POA: Diagnosis not present

## 2015-08-18 DIAGNOSIS — F329 Major depressive disorder, single episode, unspecified: Secondary | ICD-10-CM | POA: Diagnosis present

## 2015-08-18 DIAGNOSIS — R7989 Other specified abnormal findings of blood chemistry: Secondary | ICD-10-CM

## 2015-08-18 DIAGNOSIS — C8330 Diffuse large B-cell lymphoma, unspecified site: Secondary | ICD-10-CM | POA: Diagnosis present

## 2015-08-18 DIAGNOSIS — R938 Abnormal findings on diagnostic imaging of other specified body structures: Secondary | ICD-10-CM | POA: Diagnosis present

## 2015-08-18 DIAGNOSIS — R419 Unspecified symptoms and signs involving cognitive functions and awareness: Secondary | ICD-10-CM

## 2015-08-18 DIAGNOSIS — I10 Essential (primary) hypertension: Secondary | ICD-10-CM | POA: Diagnosis present

## 2015-08-18 DIAGNOSIS — Z9841 Cataract extraction status, right eye: Secondary | ICD-10-CM

## 2015-08-18 DIAGNOSIS — R51 Headache: Secondary | ICD-10-CM | POA: Diagnosis present

## 2015-08-18 DIAGNOSIS — R079 Chest pain, unspecified: Secondary | ICD-10-CM | POA: Diagnosis present

## 2015-08-18 DIAGNOSIS — E785 Hyperlipidemia, unspecified: Secondary | ICD-10-CM | POA: Diagnosis present

## 2015-08-18 DIAGNOSIS — F410 Panic disorder [episodic paroxysmal anxiety] without agoraphobia: Secondary | ICD-10-CM | POA: Diagnosis present

## 2015-08-18 DIAGNOSIS — I959 Hypotension, unspecified: Secondary | ICD-10-CM | POA: Diagnosis not present

## 2015-08-18 DIAGNOSIS — R0902 Hypoxemia: Secondary | ICD-10-CM

## 2015-08-18 DIAGNOSIS — R945 Abnormal results of liver function studies: Secondary | ICD-10-CM

## 2015-08-18 DIAGNOSIS — N179 Acute kidney failure, unspecified: Secondary | ICD-10-CM | POA: Diagnosis present

## 2015-08-18 DIAGNOSIS — R0602 Shortness of breath: Secondary | ICD-10-CM

## 2015-08-18 DIAGNOSIS — G4700 Insomnia, unspecified: Secondary | ICD-10-CM | POA: Diagnosis present

## 2015-08-18 DIAGNOSIS — D72829 Elevated white blood cell count, unspecified: Secondary | ICD-10-CM

## 2015-08-18 DIAGNOSIS — R9389 Abnormal findings on diagnostic imaging of other specified body structures: Secondary | ICD-10-CM

## 2015-08-18 DIAGNOSIS — Z9221 Personal history of antineoplastic chemotherapy: Secondary | ICD-10-CM

## 2015-08-18 DIAGNOSIS — R531 Weakness: Secondary | ICD-10-CM | POA: Diagnosis not present

## 2015-08-18 DIAGNOSIS — Z7982 Long term (current) use of aspirin: Secondary | ICD-10-CM

## 2015-08-18 DIAGNOSIS — K5900 Constipation, unspecified: Secondary | ICD-10-CM

## 2015-08-18 DIAGNOSIS — Z955 Presence of coronary angioplasty implant and graft: Secondary | ICD-10-CM

## 2015-08-18 DIAGNOSIS — I6529 Occlusion and stenosis of unspecified carotid artery: Secondary | ICD-10-CM | POA: Diagnosis present

## 2015-08-18 DIAGNOSIS — M545 Low back pain: Secondary | ICD-10-CM | POA: Diagnosis present

## 2015-08-18 DIAGNOSIS — R748 Abnormal levels of other serum enzymes: Secondary | ICD-10-CM | POA: Diagnosis present

## 2015-08-18 DIAGNOSIS — I2510 Atherosclerotic heart disease of native coronary artery without angina pectoris: Secondary | ICD-10-CM | POA: Diagnosis present

## 2015-08-18 DIAGNOSIS — E669 Obesity, unspecified: Secondary | ICD-10-CM | POA: Diagnosis present

## 2015-08-18 DIAGNOSIS — G8929 Other chronic pain: Secondary | ICD-10-CM | POA: Diagnosis present

## 2015-08-18 DIAGNOSIS — Z9842 Cataract extraction status, left eye: Secondary | ICD-10-CM

## 2015-08-18 DIAGNOSIS — Z86010 Personal history of colonic polyps: Secondary | ICD-10-CM

## 2015-08-18 DIAGNOSIS — Z79899 Other long term (current) drug therapy: Secondary | ICD-10-CM

## 2015-08-18 DIAGNOSIS — E872 Acidosis: Secondary | ICD-10-CM | POA: Diagnosis present

## 2015-08-18 DIAGNOSIS — K219 Gastro-esophageal reflux disease without esophagitis: Secondary | ICD-10-CM | POA: Diagnosis present

## 2015-08-18 DIAGNOSIS — R6521 Severe sepsis with septic shock: Secondary | ICD-10-CM | POA: Diagnosis present

## 2015-08-18 DIAGNOSIS — E8770 Fluid overload, unspecified: Secondary | ICD-10-CM | POA: Diagnosis present

## 2015-08-18 DIAGNOSIS — Z961 Presence of intraocular lens: Secondary | ICD-10-CM | POA: Diagnosis present

## 2015-08-18 DIAGNOSIS — G4733 Obstructive sleep apnea (adult) (pediatric): Secondary | ICD-10-CM | POA: Diagnosis present

## 2015-08-18 DIAGNOSIS — G479 Sleep disorder, unspecified: Secondary | ICD-10-CM

## 2015-08-18 DIAGNOSIS — Z9049 Acquired absence of other specified parts of digestive tract: Secondary | ICD-10-CM

## 2015-08-18 DIAGNOSIS — R52 Pain, unspecified: Secondary | ICD-10-CM

## 2015-08-18 DIAGNOSIS — Z9011 Acquired absence of right breast and nipple: Secondary | ICD-10-CM

## 2015-08-18 DIAGNOSIS — R159 Full incontinence of feces: Secondary | ICD-10-CM | POA: Diagnosis present

## 2015-08-18 DIAGNOSIS — Z8572 Personal history of non-Hodgkin lymphomas: Secondary | ICD-10-CM

## 2015-08-18 DIAGNOSIS — J189 Pneumonia, unspecified organism: Secondary | ICD-10-CM | POA: Diagnosis present

## 2015-08-18 DIAGNOSIS — K5901 Slow transit constipation: Secondary | ICD-10-CM | POA: Diagnosis present

## 2015-08-18 DIAGNOSIS — E871 Hypo-osmolality and hyponatremia: Secondary | ICD-10-CM | POA: Diagnosis present

## 2015-08-18 DIAGNOSIS — Z853 Personal history of malignant neoplasm of breast: Secondary | ICD-10-CM

## 2015-08-18 LAB — COMPREHENSIVE METABOLIC PANEL
ALBUMIN: 3.2 g/dL — AB (ref 3.5–5.0)
ALT: 83 U/L — AB (ref 14–54)
AST: 115 U/L — AB (ref 15–41)
Alkaline Phosphatase: 169 U/L — ABNORMAL HIGH (ref 38–126)
Anion gap: 16 — ABNORMAL HIGH (ref 5–15)
BUN: 78 mg/dL — AB (ref 6–20)
CHLORIDE: 98 mmol/L — AB (ref 101–111)
CO2: 17 mmol/L — ABNORMAL LOW (ref 22–32)
CREATININE: 3.42 mg/dL — AB (ref 0.44–1.00)
Calcium: 8.3 mg/dL — ABNORMAL LOW (ref 8.9–10.3)
GFR calc Af Amer: 14 mL/min — ABNORMAL LOW (ref >60)
GFR, EST NON AFRICAN AMERICAN: 12 mL/min — AB (ref >60)
GLUCOSE: 172 mg/dL — AB (ref 65–99)
Potassium: 4.2 mmol/L (ref 3.5–5.1)
Sodium: 131 mmol/L — ABNORMAL LOW (ref 135–145)
Total Bilirubin: 1.1 mg/dL (ref 0.3–1.2)
Total Protein: 6.5 g/dL (ref 6.5–8.1)

## 2015-08-18 LAB — URINALYSIS, ROUTINE W REFLEX MICROSCOPIC
Glucose, UA: NEGATIVE mg/dL
HGB URINE DIPSTICK: NEGATIVE
Ketones, ur: NEGATIVE mg/dL
Leukocytes, UA: NEGATIVE
Nitrite: NEGATIVE
PH: 5 (ref 5.0–8.0)
Protein, ur: NEGATIVE mg/dL
SPECIFIC GRAVITY, URINE: 1.019 (ref 1.005–1.030)

## 2015-08-18 LAB — CBC WITH DIFFERENTIAL/PLATELET
BASOS PCT: 0 %
Basophils Absolute: 0 10*3/uL (ref 0.0–0.1)
EOS PCT: 0 %
Eosinophils Absolute: 0 10*3/uL (ref 0.0–0.7)
HEMATOCRIT: 41.9 % (ref 36.0–46.0)
HEMOGLOBIN: 14.5 g/dL (ref 12.0–15.0)
LYMPHS PCT: 5 %
Lymphs Abs: 1.3 10*3/uL (ref 0.7–4.0)
MCH: 27.7 pg (ref 26.0–34.0)
MCHC: 34.6 g/dL (ref 30.0–36.0)
MCV: 80 fL (ref 78.0–100.0)
MONOS PCT: 4 %
Monocytes Absolute: 1 10*3/uL (ref 0.1–1.0)
NEUTROS ABS: 23.9 10*3/uL — AB (ref 1.7–7.7)
Neutrophils Relative %: 91 %
Platelets: 185 10*3/uL (ref 150–400)
RBC: 5.24 MIL/uL — ABNORMAL HIGH (ref 3.87–5.11)
RDW: 17.2 % — ABNORMAL HIGH (ref 11.5–15.5)
WBC: 26.2 10*3/uL — ABNORMAL HIGH (ref 4.0–10.5)

## 2015-08-18 LAB — PROTIME-INR
INR: 1.25 (ref 0.00–1.49)
PROTHROMBIN TIME: 15.4 s — AB (ref 11.6–15.2)

## 2015-08-18 LAB — I-STAT CG4 LACTIC ACID, ED
LACTIC ACID, VENOUS: 3.3 mmol/L — AB (ref 0.5–1.9)
Lactic Acid, Venous: 3.04 mmol/L (ref 0.5–1.9)

## 2015-08-18 LAB — I-STAT TROPONIN, ED: TROPONIN I, POC: 0.01 ng/mL (ref 0.00–0.08)

## 2015-08-18 MED ORDER — PIPERACILLIN-TAZOBACTAM IN DEX 2-0.25 GM/50ML IV SOLN
2.2500 g | INTRAVENOUS | Status: AC
  Administered 2015-08-18: 2.25 g via INTRAVENOUS
  Filled 2015-08-18: qty 50

## 2015-08-18 MED ORDER — VANCOMYCIN HCL IN DEXTROSE 1-5 GM/200ML-% IV SOLN
1000.0000 mg | Freq: Once | INTRAVENOUS | Status: AC
  Administered 2015-08-18: 1000 mg via INTRAVENOUS
  Filled 2015-08-18: qty 200

## 2015-08-18 MED ORDER — VANCOMYCIN HCL IN DEXTROSE 1-5 GM/200ML-% IV SOLN
1000.0000 mg | INTRAVENOUS | Status: DC
  Administered 2015-08-20: 1000 mg via INTRAVENOUS
  Filled 2015-08-18: qty 200

## 2015-08-18 MED ORDER — SODIUM CHLORIDE 0.9 % IV BOLUS (SEPSIS)
500.0000 mL | Freq: Once | INTRAVENOUS | Status: AC
  Administered 2015-08-18: 500 mL via INTRAVENOUS

## 2015-08-18 MED ORDER — PIPERACILLIN-TAZOBACTAM 3.375 G IVPB 30 MIN: 3.3750 g | Freq: Once | INTRAVENOUS | Status: DC

## 2015-08-18 MED ORDER — SODIUM CHLORIDE 0.9 % IV BOLUS (SEPSIS)
1000.0000 mL | Freq: Once | INTRAVENOUS | Status: AC
  Administered 2015-08-18: 1000 mL via INTRAVENOUS

## 2015-08-18 MED ORDER — PIPERACILLIN-TAZOBACTAM IN DEX 2-0.25 GM/50ML IV SOLN
2.2500 g | Freq: Three times a day (TID) | INTRAVENOUS | Status: DC
Start: 2015-08-19 — End: 2015-08-20
  Administered 2015-08-19 – 2015-08-20 (×5): 2.25 g via INTRAVENOUS
  Filled 2015-08-18 (×6): qty 50

## 2015-08-18 NOTE — Progress Notes (Signed)
Pharmacy Antibiotic Note  Saphyra Bost Bensman is a 80 y.o. female admitted on 08/18/2015 with sepsis.  Pharmacy has been consulted for vanc and zosyn dosing.  First doses given in ED.  Scr 3.42, CrCl ~16.4 mls/min  Plan: Vancomycin 1gm IV q48h Zosyn 2.25mg  IV q8h Follow renal function, cultures, clinical course vanc trough at steady state as needed    No data recorded.   Recent Labs Lab 08/18/15 2015 08/18/15 2034  WBC 26.2*  --   LATICACIDVEN  --  3.30*    CrCl cannot be calculated (Unknown ideal weight.).    No Known Allergies  Antimicrobials this admission: 7/3 Zosyn >> 7/3 Vancomycin >>   Microbiology results: 7/3 BCx: sent 7/3 UCx:    Thank you for allowing pharmacy to be a part of this patient's care.  Dolly Rias RPh 08/18/2015, 9:00 PM Pager [phone]

## 2015-08-18 NOTE — ED Notes (Signed)
Patient transported to CT 

## 2015-08-18 NOTE — ED Notes (Signed)
NT will attempt to draw blood.

## 2015-08-18 NOTE — H&P (Signed)
PULMONARY / CRITICAL CARE MEDICINE   Name: [name] MRN: [id number] DOB: 1935/06/16    ADMISSION DATE:  08/18/2015 CONSULTATION DATE:  [name]  REFERRING MD:  Dr.Nguyen EDP  CHIEF COMPLAINT:    HISTORY OF PRESENT ILLNESS:   80 year old female with PMH as below, which includes OSA on nocturnal O2(followed by CY), HTN, Breast Ca, large cell non-Hodgkins lymphoma s/p chemotherapy 2015 (6 cycles Rituxan/bendamustine). She follows with Dr. Marin Olp who describes her performance status as poor. Recent PET scan has shown some activity in upper abdominal and and bilateral hilar lymph nodes. She has been asymptomatic, but also had some endometrial thickening. She was referred to gynecology and had endometrial biopsy which was negative. She was also scheduled for [facility] 7/11.  However, before this could be done she presented to the emergency department 7/3. She was at home where she felt as though she may have had the flu last week, but had been feeling better. Her home health aide was supposed to come 7/3, but did not, and for this reason she did not eat/drink much and had difficulty making it to the bathroom. She was incontinent of stool and for that reason called EMS. She wasted EMS help sitting up on couch. While moving her they got some stool on her chair (which is honestly her chief complaint at the time of my evaluation, asking me for recommendations of cleaners and if I have any experience with [name]). In the emergency department she was noted to have low grade fever and WBC elevation to 26.2. Lactic acid and Creatinine also mildly elevated. She remained hypotensive as well, despite 2L IVF resuscitation. PCCM asked to evaluate.  PAST MEDICAL HISTORY :  She  has a past medical history of Hyperlipidemia; Chronic headache; Osteoarthritis; Chronic chest pain; Hypertension; Low back pain; GERD (gastroesophageal reflux disease); History of colon polyps; Depression; Dyspnea; Anxiety; History of panic  attacks; Hiatal hernia; History of small bowel obstruction; History of colon polyps; History of breast cancer (oncologist-  dr Marin Olp--  no recurrence); Bilateral carotid artery stenosis; PMB (postmenopausal bleeding); Thickened endometrium; CAD (coronary artery disease); OSA (obstructive sleep apnea); Diffuse large cell non-Hodgkin's lymphoma [facility]) (oncologist-  dr Marin Olp-  per last office note being monitored for possible recurrence); and S/P drug eluting coronary stent placement.  PAST SURGICAL HISTORY: She  has past surgical history that includes Tonsillectomy; Inner ear surgery; Submandibular gland excision (Left, 05/10/2013); Portacath placement (06/04/13); Mastectomy with axillary lymph node dissection (Right, 01-03-2001); Cholecystectomy (2005); Cataract extraction w/ intraocular lens  implant, bilateral (2011); Knee arthroscopy (Right); Coronary angioplasty with stent (07-09-2009  dr Darnell Level brodie); Cardiac catheterization (07-15-2010   dr Kathlyn [place]); transthoracic echocardiogram (10-04-2006); Cardiovascular stress test (12-24-2009); and Colonoscopy with esophagogastroduodenoscopy (egd) (11-12-2014).  No Known Allergies  No current facility-administered medications on file prior to encounter.   Current Outpatient Prescriptions on File Prior to Encounter  Medication Sig  . amitriptyline (ELAVIL) 100 MG tablet Take 100 mg by mouth at bedtime.  Marland Kitchen aspirin 81 MG EC tablet Take 81 mg by mouth every morning.   Marland Kitchen atorvastatin (LIPITOR) 80 MG tablet Take 1 tablet (80 mg total) by mouth daily.  . cholecalciferol (VITAMIN D) 1000 UNITS tablet Take 1,000 Units by mouth every morning.   . Cinnamon 500 MG TABS Take 1,000 mg by mouth every morning.   Marland Kitchen GARLIC PO Take 1 tablet by mouth daily.  Marland Kitchen LORazepam (ATIVAN) 2 MG tablet Take 2 mg by mouth 2 (two) times daily.   Marland Kitchen  multivitamin-iron-minerals-folic acid (CENTRUM) chewable tablet Chew 1 tablet by mouth every morning.   . nitroGLYCERIN (NITROSTAT)  0.4 MG SL tablet Place 1 tablet (0.4 mg total) under the tongue every 5 (five) minutes as needed for chest pain.  . Omega-3 Fatty Acids (FISH OIL) 1000 MG CAPS Take 1,000 mg by mouth every morning.   . polyethylene glycol powder (GLYCOLAX/MIRALAX) powder Take 17 g by mouth 2 (two) times daily as needed. (Patient taking differently: Take 17 g by mouth 2 (two) times daily as needed for mild constipation. )  . quinapril (ACCUPRIL) 20 MG tablet Take 1 tablet (20 mg total) by mouth every morning.    FAMILY HISTORY:  Her indicated that her mother is deceased. She indicated that her father is deceased. She indicated that her brother is deceased.   SOCIAL HISTORY: She  reports that she has never smoked. She has never used smokeless tobacco. She reports that she does not drink alcohol or use illicit drugs.  REVIEW OF SYSTEMS:   Bolds are positive  Constitutional: weight loss, gain, night sweats, Fevers, chills, fatigue .  HEENT: headaches, Sore throat, sneezing, nasal congestion, post nasal drip, Difficulty swallowing, Tooth/dental problems, visual complaints visual changes, ear ache CV:  chest pain, radiates: ,Orthopnea, PND, swelling in lower extremities, dizziness, palpitations, syncope.  GI  heartburn, indigestion, abdominal pain, nausea, vomiting, diarrhea, change in bowel habits, loss of appetite, bloody stools.  Resp: cough, productive: , hemoptysis, dyspnea, chest pain, pleuritic.  Skin: rash or itching or icterus GU: dysuria, change in color of urine, urgency or frequency. flank pain, hematuria  MS: joint pain or swelling. decreased range of motion  Psych: change in mood or affect. depression or anxiety.  Neuro: difficulty with speech, weakness, numbness, ataxia    SUBJECTIVE:   VITAL SIGNS: BP 86/41 mmHg  Pulse 99  Temp(Src) 99.7 F (37.6 C) (Rectal)  Resp 24  SpO2 94%  HEMODYNAMICS:    VENTILATOR SETTINGS:    INTAKE / OUTPUT:    PHYSICAL EXAMINATION: General:  Obese  deconditioned appearing female in NAD Neuro:  Alert, oriented x 3, non-focal HEENT:  [place]/AT, PERRL, no appreciable JVD Cardiovascular:  RRR< no MRG Lungs:  Clear bilateral breath sounds Abdomen:  Soft, non-tender, non-distended Musculoskeletal:  No acute deformity or ROM limitation Skin:  Grossly intact  Repeat sepsis assessment completed.   LABS:  BMET  Recent Labs Lab 08/18/15 2015  NA 131*  K 4.2  CL 98*  CO2 17*  BUN 78*  CREATININE 3.42*  GLUCOSE 172*    Electrolytes  Recent Labs Lab 08/18/15 2015  CALCIUM 8.3*    CBC  Recent Labs Lab 08/18/15 2015  WBC 26.2*  HGB 14.5  HCT 41.9  PLT 185    Coag's  Recent Labs Lab 08/18/15 2218  INR 1.25    Sepsis Markers  Recent Labs Lab 08/18/15 2034 08/18/15 2221  LATICACIDVEN 3.30* 3.04*    ABG No results for input(s): PHART, PCO2ART, PO2ART in the last 168 hours.  Liver Enzymes  Recent Labs Lab 08/18/15 2015  AST 115*  ALT 83*  ALKPHOS 169*  BILITOT 1.1  ALBUMIN 3.2*    Cardiac Enzymes No results for input(s): TROPONINI, PROBNP in the last 168 hours.  Glucose No results for input(s): GLUCAP in the last 168 hours.  Imaging Dg Chest 2 View  08/18/2015  CLINICAL DATA:  Hypotensive and tachycardic. EXAM: CHEST  2 VIEW COMPARISON:  08/11/2015 FINDINGS: There is rotational artifact aortic atherosclerosis noted. No pleural effusion  or edema identified. No airspace consolidation. Mild multi level thoracic spondylosis noted. IMPRESSION: 1. No acute cardiopulmonary abnormalities. 2. Aortic atherosclerosis. Electronically Signed   By: Kerby Moors M.D.   On: 08/18/2015 21:25     STUDIES:  CT abdo/pelv 7/4 > increased distal stool burden, no additional acute abnormality  CULTURES: BCx2 7/3 > Urine 7/3 >  ANTIBIOTICS: Zosyn 7/4 > Vancomycin 7/4 >  SIGNIFICANT EVENTS: 7/3 admit  LINES/TUBES: PIV  DISCUSSION: 80 year old female presenting to ED after episode of incontinence at  home. She was noted to be hypotensive in ED. She reports that normal BP low 100s. Lactic, Creat and WBC elevated so concern for sepsis. Remained hypotensive despite IVF. Will admit to ICU for further monitoring. May need CVL and pressors, however, her mental status is wnl and she is making urine. Will not rush to treat low BP measured on calf. Will continue to hydrate and if MS or UOP decline will consider vasopressors. Give IV ABX with WBC and low grade fever. Given her cancer history and recent PET changes, unclear if source of WBC is infectious.   ASSESSMENT / PLAN:  PULMONARY A: OSA on nocturnal O2 (could not tol CPAP)  P:   Continue nocturnal O2 SpO2 goal 90-95% Pulmonary hygeine  CARDIOVASCULAR A:  Hypotension > unclear etiology hypovolemia vs sepsis. Also suspect measurement is inadequate.  Elevated lactic acid H/o HTN, CAD (s/p PCI LAD 2011), carotid stenosis  P:  MAP goal > 57mmHg IVF bolus now, NS at 18mm/Hr If remains low may need art-line to determine accurate BP Holding outpatient quinapril Trend lactic  RENAL A:   AKI Hyponatremia  P:   Hydrate Follow BMP Correct electrolytes as indicated  GASTROINTESTINAL A:   Stool incontinence Constipation  P:   Protonix PO for SUP Advance diet as tolerated Miralax per home regimen   HEMATOLOGIC A:   Leukocytosis - unclear if infectious Hx breast Ca, also non-hidgkin's s/p chemo 2015 current remission Recent PET changes as above and endometrial thickening with reportedly negative endometrial biospy  P:  Follow CBC Heparin for VTE ppx  INFECTIOUS A:   SIRS ? Sepsis, no obvious source  P:   Broad ABX Follow cultures Low threshold to narrow  ENDOCRINE A:   No acute issues  P:   Monitor  NEUROLOGIC A:   Depression Anxiety  P:   RASS goal: 0 Close monitoring   FAMILY  - Updates: patient updated in ED  - Inter-disciplinary family meet or Palliative Care meeting due by:  7/10   Georgann Housekeeper, AGACNP-BC [facility] Pulmonology/Critical Care Pager [phone] or [phone]  08/19/2015 12:48 AM

## 2015-08-18 NOTE — ED Notes (Signed)
Bed: [id number] Expected date:  Expected time:  Means of arrival:  Comments: 80 yo F  Weakness

## 2015-08-18 NOTE — ED Notes (Signed)
Patient transported to X-ray 

## 2015-08-18 NOTE — ED Notes (Signed)
Informed Dr. Alfonse Spruce of lactic acid of 3.30. @ 2037

## 2015-08-18 NOTE — ED Notes (Signed)
Pt. returned from XR. 

## 2015-08-18 NOTE — ED Notes (Signed)
Pt BIBA, reports call out for "sick person" requesting to be "sat up on the couch." Noted to be sitting in her own feces. Pt reports home health sees here MWF, but did not come out today. Pt noted to be hypotensive and tachycardic with EMS. Pt a&ox4, denies pain, initially refused transport, probable SW consult needed per EMS.

## 2015-08-19 DIAGNOSIS — K5901 Slow transit constipation: Secondary | ICD-10-CM | POA: Diagnosis not present

## 2015-08-19 DIAGNOSIS — R079 Chest pain, unspecified: Secondary | ICD-10-CM | POA: Diagnosis present

## 2015-08-19 DIAGNOSIS — M545 Low back pain: Secondary | ICD-10-CM | POA: Diagnosis present

## 2015-08-19 DIAGNOSIS — R590 Localized enlarged lymph nodes: Secondary | ICD-10-CM | POA: Diagnosis not present

## 2015-08-19 DIAGNOSIS — I959 Hypotension, unspecified: Secondary | ICD-10-CM | POA: Diagnosis not present

## 2015-08-19 DIAGNOSIS — E8770 Fluid overload, unspecified: Secondary | ICD-10-CM | POA: Diagnosis present

## 2015-08-19 DIAGNOSIS — E871 Hypo-osmolality and hyponatremia: Secondary | ICD-10-CM | POA: Diagnosis present

## 2015-08-19 DIAGNOSIS — I9589 Other hypotension: Secondary | ICD-10-CM | POA: Diagnosis not present

## 2015-08-19 DIAGNOSIS — I509 Heart failure, unspecified: Secondary | ICD-10-CM | POA: Diagnosis not present

## 2015-08-19 DIAGNOSIS — I10 Essential (primary) hypertension: Secondary | ICD-10-CM | POA: Diagnosis not present

## 2015-08-19 DIAGNOSIS — G479 Sleep disorder, unspecified: Secondary | ICD-10-CM | POA: Diagnosis not present

## 2015-08-19 DIAGNOSIS — Z9221 Personal history of antineoplastic chemotherapy: Secondary | ICD-10-CM | POA: Diagnosis not present

## 2015-08-19 DIAGNOSIS — R51 Headache: Secondary | ICD-10-CM | POA: Diagnosis present

## 2015-08-19 DIAGNOSIS — N179 Acute kidney failure, unspecified: Secondary | ICD-10-CM | POA: Diagnosis present

## 2015-08-19 DIAGNOSIS — C8330 Diffuse large B-cell lymphoma, unspecified site: Secondary | ICD-10-CM | POA: Diagnosis present

## 2015-08-19 DIAGNOSIS — E785 Hyperlipidemia, unspecified: Secondary | ICD-10-CM | POA: Diagnosis present

## 2015-08-19 DIAGNOSIS — A419 Sepsis, unspecified organism: Secondary | ICD-10-CM | POA: Diagnosis present

## 2015-08-19 DIAGNOSIS — Z853 Personal history of malignant neoplasm of breast: Secondary | ICD-10-CM | POA: Diagnosis not present

## 2015-08-19 DIAGNOSIS — R7989 Other specified abnormal findings of blood chemistry: Secondary | ICD-10-CM | POA: Diagnosis not present

## 2015-08-19 DIAGNOSIS — F329 Major depressive disorder, single episode, unspecified: Secondary | ICD-10-CM | POA: Diagnosis present

## 2015-08-19 DIAGNOSIS — Z86010 Personal history of colonic polyps: Secondary | ICD-10-CM | POA: Diagnosis not present

## 2015-08-19 DIAGNOSIS — F410 Panic disorder [episodic paroxysmal anxiety] without agoraphobia: Secondary | ICD-10-CM | POA: Diagnosis present

## 2015-08-19 DIAGNOSIS — I2510 Atherosclerotic heart disease of native coronary artery without angina pectoris: Secondary | ICD-10-CM | POA: Diagnosis present

## 2015-08-19 DIAGNOSIS — J189 Pneumonia, unspecified organism: Secondary | ICD-10-CM | POA: Diagnosis present

## 2015-08-19 DIAGNOSIS — R6521 Severe sepsis with septic shock: Secondary | ICD-10-CM | POA: Diagnosis not present

## 2015-08-19 DIAGNOSIS — E872 Acidosis: Secondary | ICD-10-CM | POA: Diagnosis present

## 2015-08-19 DIAGNOSIS — K219 Gastro-esophageal reflux disease without esophagitis: Secondary | ICD-10-CM | POA: Diagnosis present

## 2015-08-19 DIAGNOSIS — R109 Unspecified abdominal pain: Secondary | ICD-10-CM | POA: Diagnosis not present

## 2015-08-19 DIAGNOSIS — G4700 Insomnia, unspecified: Secondary | ICD-10-CM | POA: Diagnosis not present

## 2015-08-19 DIAGNOSIS — R531 Weakness: Secondary | ICD-10-CM | POA: Diagnosis present

## 2015-08-19 DIAGNOSIS — G4730 Sleep apnea, unspecified: Secondary | ICD-10-CM | POA: Diagnosis not present

## 2015-08-19 DIAGNOSIS — R419 Unspecified symptoms and signs involving cognitive functions and awareness: Secondary | ICD-10-CM | POA: Diagnosis not present

## 2015-08-19 DIAGNOSIS — Z8572 Personal history of non-Hodgkin lymphomas: Secondary | ICD-10-CM | POA: Diagnosis not present

## 2015-08-19 DIAGNOSIS — G4733 Obstructive sleep apnea (adult) (pediatric): Secondary | ICD-10-CM | POA: Diagnosis present

## 2015-08-19 DIAGNOSIS — R601 Generalized edema: Secondary | ICD-10-CM | POA: Diagnosis not present

## 2015-08-19 DIAGNOSIS — I6529 Occlusion and stenosis of unspecified carotid artery: Secondary | ICD-10-CM | POA: Diagnosis present

## 2015-08-19 DIAGNOSIS — G8929 Other chronic pain: Secondary | ICD-10-CM | POA: Diagnosis present

## 2015-08-19 LAB — BLOOD CULTURE ID PANEL (REFLEXED)
ACINETOBACTER BAUMANNII: NOT DETECTED
CANDIDA GLABRATA: NOT DETECTED
CANDIDA KRUSEI: NOT DETECTED
CANDIDA PARAPSILOSIS: NOT DETECTED
CARBAPENEM RESISTANCE: NOT DETECTED
Candida albicans: NOT DETECTED
Candida tropicalis: NOT DETECTED
ESCHERICHIA COLI: NOT DETECTED
Enterobacter cloacae complex: NOT DETECTED
Enterobacteriaceae species: NOT DETECTED
Enterococcus species: NOT DETECTED
Haemophilus influenzae: NOT DETECTED
KLEBSIELLA OXYTOCA: NOT DETECTED
KLEBSIELLA PNEUMONIAE: NOT DETECTED
LISTERIA MONOCYTOGENES: NOT DETECTED
Methicillin resistance: NOT DETECTED
NEISSERIA MENINGITIDIS: NOT DETECTED
Proteus species: NOT DETECTED
Pseudomonas aeruginosa: NOT DETECTED
SERRATIA MARCESCENS: NOT DETECTED
STAPHYLOCOCCUS SPECIES: DETECTED — AB
STREPTOCOCCUS AGALACTIAE: NOT DETECTED
STREPTOCOCCUS SPECIES: NOT DETECTED
Staphylococcus aureus (BCID): NOT DETECTED
Streptococcus pneumoniae: NOT DETECTED
Streptococcus pyogenes: NOT DETECTED
Vancomycin resistance: NOT DETECTED

## 2015-08-19 LAB — BASIC METABOLIC PANEL
ANION GAP: 14 (ref 5–15)
BUN: 75 mg/dL — ABNORMAL HIGH (ref 6–20)
CALCIUM: 7.1 mg/dL — AB (ref 8.9–10.3)
CO2: 14 mmol/L — AB (ref 22–32)
Chloride: 105 mmol/L (ref 101–111)
Creatinine, Ser: 3.02 mg/dL — ABNORMAL HIGH (ref 0.44–1.00)
GFR, EST AFRICAN AMERICAN: 16 mL/min — AB (ref >60)
GFR, EST NON AFRICAN AMERICAN: 14 mL/min — AB (ref >60)
GLUCOSE: 166 mg/dL — AB (ref 65–99)
POTASSIUM: 4.1 mmol/L (ref 3.5–5.1)
Sodium: 133 mmol/L — ABNORMAL LOW (ref 135–145)

## 2015-08-19 LAB — CBC
HEMATOCRIT: 36.4 % (ref 36.0–46.0)
HEMOGLOBIN: 12.3 g/dL (ref 12.0–15.0)
MCH: 27.7 pg (ref 26.0–34.0)
MCHC: 33.8 g/dL (ref 30.0–36.0)
MCV: 82 fL (ref 78.0–100.0)
Platelets: 173 10*3/uL (ref 150–400)
RBC: 4.44 MIL/uL (ref 3.87–5.11)
RDW: 17.5 % — ABNORMAL HIGH (ref 11.5–15.5)
WBC: 17.8 10*3/uL — AB (ref 4.0–10.5)

## 2015-08-19 LAB — CORTISOL: Cortisol, Plasma: 22.2 ug/dL

## 2015-08-19 LAB — MAGNESIUM: MAGNESIUM: 2 mg/dL (ref 1.7–2.4)

## 2015-08-19 LAB — GLUCOSE, CAPILLARY
GLUCOSE-CAPILLARY: 171 mg/dL — AB (ref 65–99)
GLUCOSE-CAPILLARY: 158 mg/dL — AB (ref 65–99)
Glucose-Capillary: 131 mg/dL — ABNORMAL HIGH (ref 65–99)
Glucose-Capillary: 324 mg/dL — ABNORMAL HIGH (ref 65–99)
Glucose-Capillary: 172 mg/dL — ABNORMAL HIGH (ref 65–99)

## 2015-08-19 LAB — LACTIC ACID, PLASMA
LACTIC ACID, VENOUS: 1.9 mmol/L (ref 0.5–1.9)
Lactic Acid, Venous: 2.2 mmol/L (ref 0.5–1.9)
Lactic Acid, Venous: 2 mmol/L (ref 0.5–1.9)

## 2015-08-19 LAB — PROCALCITONIN: PROCALCITONIN: 0.76 ng/mL

## 2015-08-19 LAB — PHOSPHORUS: Phosphorus: 4.6 mg/dL (ref 2.5–4.6)

## 2015-08-19 LAB — I-STAT CG4 LACTIC ACID, ED: Lactic Acid, Venous: 2.06 mmol/L (ref 0.5–1.9)

## 2015-08-19 LAB — MRSA PCR SCREENING: MRSA by PCR: NEGATIVE

## 2015-08-19 LAB — PROTIME-INR
INR: 1.37 (ref 0.00–1.49)
Prothrombin Time: 16.5 seconds — ABNORMAL HIGH (ref 11.6–15.2)

## 2015-08-19 LAB — TROPONIN I: Troponin I: 0.04 ng/mL (ref <0.03)

## 2015-08-19 MED ORDER — ASPIRIN EC 81 MG PO TBEC
81.0000 mg | DELAYED_RELEASE_TABLET | Freq: Every morning | ORAL | Status: DC
  Administered 2015-08-19 – 2015-09-01 (×13): 81 mg via ORAL
  Filled 2015-08-19 (×14): qty 1

## 2015-08-19 MED ORDER — SODIUM CHLORIDE 0.9 % IV BOLUS (SEPSIS)
2000.0000 mL | Freq: Once | INTRAVENOUS | Status: AC
  Administered 2015-08-19: 2000 mL via INTRAVENOUS

## 2015-08-19 MED ORDER — SODIUM CHLORIDE 0.9 % IV BOLUS (SEPSIS)
500.0000 mL | Freq: Once | INTRAVENOUS | Status: AC
  Administered 2015-08-19: 500 mL via INTRAVENOUS

## 2015-08-19 MED ORDER — HEPARIN SODIUM (PORCINE) 5000 UNIT/ML IJ SOLN
5000.0000 [IU] | Freq: Three times a day (TID) | INTRAMUSCULAR | Status: DC
  Administered 2015-08-19 – 2015-09-01 (×40): 5000 [IU] via SUBCUTANEOUS
  Filled 2015-08-19 (×40): qty 1

## 2015-08-19 MED ORDER — SODIUM CHLORIDE 0.9 % IV SOLN: 250.0000 mL | INTRAVENOUS | Status: DC | PRN

## 2015-08-19 MED ORDER — ATORVASTATIN CALCIUM 40 MG PO TABS
80.0000 mg | ORAL_TABLET | Freq: Every day | ORAL | Status: DC
  Administered 2015-08-19 – 2015-08-31 (×13): 80 mg via ORAL
  Filled 2015-08-19 (×3): qty 2
  Filled 2015-08-19 (×2): qty 1
  Filled 2015-08-19: qty 2
  Filled 2015-08-19: qty 1
  Filled 2015-08-19: qty 2
  Filled 2015-08-19 (×2): qty 1
  Filled 2015-08-19 (×3): qty 2

## 2015-08-19 MED ORDER — POLYETHYLENE GLYCOL 3350 17 G PO PACK
17.0000 g | PACK | Freq: Two times a day (BID) | ORAL | Status: DC | PRN
  Administered 2015-08-25 – 2015-08-26 (×2): 17 g via ORAL
  Filled 2015-08-19 (×2): qty 1

## 2015-08-19 MED ORDER — AMITRIPTYLINE HCL 25 MG PO TABS: 100.0000 mg | ORAL_TABLET | Freq: Every day | ORAL | Status: DC

## 2015-08-19 MED ORDER — SODIUM CHLORIDE 0.9 % IV SOLN
INTRAVENOUS | Status: DC
  Administered 2015-08-19: 08:00:00 via INTRAVENOUS
  Administered 2015-08-21: 125 mL/h via INTRAVENOUS
  Administered 2015-08-21: 22:00:00 via INTRAVENOUS

## 2015-08-19 MED ORDER — SENNOSIDES-DOCUSATE SODIUM 8.6-50 MG PO TABS
2.0000 | ORAL_TABLET | Freq: Two times a day (BID) | ORAL | Status: DC
  Administered 2015-08-19 – 2015-09-01 (×26): 2 via ORAL
  Filled 2015-08-19 (×28): qty 2

## 2015-08-19 MED ORDER — SODIUM CHLORIDE 0.9 % IV BOLUS (SEPSIS)
1000.0000 mL | Freq: Once | INTRAVENOUS | Status: AC
  Administered 2015-08-19: 1000 mL via INTRAVENOUS

## 2015-08-19 MED ORDER — HYDROCORTISONE NA SUCCINATE PF 100 MG IJ SOLR
50.0000 mg | Freq: Four times a day (QID) | INTRAMUSCULAR | Status: DC
  Administered 2015-08-19 – 2015-08-21 (×9): 50 mg via INTRAVENOUS
  Filled 2015-08-19 (×9): qty 2

## 2015-08-19 MED ORDER — AMITRIPTYLINE HCL 25 MG PO TABS
100.0000 mg | ORAL_TABLET | Freq: Every day | ORAL | Status: DC
  Administered 2015-08-19 – 2015-08-31 (×14): 100 mg via ORAL
  Filled 2015-08-19 (×14): qty 4

## 2015-08-19 MED ORDER — PANTOPRAZOLE SODIUM 40 MG PO TBEC
40.0000 mg | DELAYED_RELEASE_TABLET | Freq: Every day | ORAL | Status: DC
  Administered 2015-08-19 – 2015-09-01 (×13): 40 mg via ORAL
  Filled 2015-08-19 (×14): qty 1

## 2015-08-19 MED ORDER — INSULIN ASPART 100 UNIT/ML ~~LOC~~ SOLN
1.0000 [IU] | SUBCUTANEOUS | Status: DC
  Administered 2015-08-19: 1 [IU] via SUBCUTANEOUS
  Administered 2015-08-19 (×2): 2 [IU] via SUBCUTANEOUS
  Administered 2015-08-20: 1 [IU] via SUBCUTANEOUS
  Administered 2015-08-20: 2 [IU] via SUBCUTANEOUS
  Administered 2015-08-20: 3 [IU] via SUBCUTANEOUS
  Administered 2015-08-20: 2 [IU] via SUBCUTANEOUS
  Administered 2015-08-20 – 2015-08-21 (×2): 1 [IU] via SUBCUTANEOUS
  Administered 2015-08-21 (×2): 2 [IU] via SUBCUTANEOUS
  Administered 2015-08-21: 1 [IU] via SUBCUTANEOUS
  Administered 2015-08-21 (×2): 2 [IU] via SUBCUTANEOUS
  Administered 2015-08-22 (×4): 1 [IU] via SUBCUTANEOUS
  Administered 2015-08-23 (×2): 2 [IU] via SUBCUTANEOUS
  Administered 2015-08-23 – 2015-08-26 (×12): 1 [IU] via SUBCUTANEOUS

## 2015-08-19 MED ORDER — ACETAMINOPHEN 325 MG PO TABS
650.0000 mg | ORAL_TABLET | ORAL | Status: DC | PRN
  Administered 2015-08-20 (×2): 650 mg via ORAL
  Filled 2015-08-19 (×2): qty 2

## 2015-08-19 NOTE — Progress Notes (Signed)
CRITICAL VALUE ALERT  Critical value received:  Potassium 6.4 with moderate hemolysis. Drawn from finger stick  Date of notification:  08/19/2015  Time of notification:  1702  Critical value read back:Yes.    Nurse who received alert:  Javier Glazier  MD notified (1st page):  Simonds  Time of first page:  1744  MD notified (2nd page):  Time of second page:  Responding MD:  Alva Garnet  Time MD responded:  [phone]

## 2015-08-19 NOTE — Progress Notes (Signed)
Nutrition Brief Note  Patient identified on the Malnutrition Screening Tool (MST) Report  Wt Readings from Last 15 Encounters:  08/19/15 212 lb 11.9 oz (96.5 kg)  07/10/15 170 lb (77.111 kg)  06/02/15 170 lb (77.111 kg)  06/07/14 150 lb (68.04 kg)  11/28/13 217 lb 3.2 oz (98.521 kg)  10/31/13 218 lb 6.4 oz (99.066 kg)  10/24/13 200 lb (90.719 kg)  09/05/13 215 lb 12.8 oz (97.886 kg)  08/08/13 223 lb 4.8 oz (101.288 kg)  07/04/13 223 lb 9.6 oz (101.424 kg)  06/12/13 228 lb 11.2 oz (103.738 kg)  06/05/13 222 lb 3.2 oz (100.789 kg)  05/09/13 170 lb (77.111 kg)  03/13/13 170 lb (77.111 kg)  09/20/12 180 lb (81.647 kg)    Body mass index is 35.4 kg/(m^2). Patient meets criteria for obesity based on current BMI.   Current diet order is Heart Healthy/Carb Modified. Diet advanced today at 0815 from NPO and pt states she has not had anything to eat yet. Pt states that PTA she had a good appetite and that she cooked for herself and did not need assistance with meal preparation. She is missing several teeth but denies this causing issues with chewing any foods during meals. Pt is very difficult to re-direct during RD visit and gets off topic easily. Physical assessment shows no muscle or fat wasting at this time.  Labs and medications reviewed.   No nutrition interventions warranted at this time. If nutrition issues arise, please consult RD.      Jarome Matin, MS, RD, LDN Inpatient Clinical Dietitian Pager # [phone] After hours/weekend pager # [phone]

## 2015-08-19 NOTE — Progress Notes (Addendum)
eLink Physician-Brief Progress Note Patient Name: [name] DOB: August 04, 1935 MRN: [id number]   Date of Service  08/19/2015  HPI/Events of Note  80 yo female with PMH of Hyperlipidemia; Chronic headache; Osteoarthritis; Chronic chest pain; Hypertension; Low back pain; GERD (gastroesophageal reflux disease); History of colon polyps; Depression; Dyspnea; Anxiety; History of panic attacks; Hiatal hernia; History of small bowel obstruction; History of colon polyps; History of breast cancer (oncologist- dr Marin Olp-- no recurrence); Bilateral carotid artery stenosis; PMB (postmenopausal bleeding); Thickened endometrium; CAD (coronary artery disease); OSA (obstructive sleep apnea); Diffuse large cell non-Hodgkin's lymphoma [facility]) (oncologist- dr Marin Olp- per last office note being monitored for possible recurrence); and S/P drug eluting coronary stent placement. Called EMS on 08/18/2015 d/t incontinence of stool. In the emergency department she was noted to have low grade fever and WBC elevation to 26.2. Lactic acid and Creatinine also mildly elevated. She remained hypotensive as well, despite 2L IVF resuscitation. PCCM asked to evaluate. Dx of sepsis with uncertain etiology. Empiric Abx Rx with Vancomycin and Zosyn. BP = 70/50 by automated cuff. SBP = 80's by manual cuff. HR = 91, sat = 95% on 2.0 L/min [place] O2 and RR = 28.   eICU Interventions  Will order: 1. 0.9 NaCl 1 liter IV over 1 hour now.      Intervention Category Evaluation Type: New Patient Evaluation  Lysle Dingwall 08/19/2015, 4:34 AM

## 2015-08-19 NOTE — ED Notes (Signed)
CCNP at bedside.  

## 2015-08-19 NOTE — ED Notes (Signed)
Informed Dr. Alfonse Spruce of lactic acid of 2.06.

## 2015-08-19 NOTE — Progress Notes (Signed)
While taking patient off the bedpan it was noticed that there was a quarter-sized blood clot in patients urine. Upon further inspection it appeared that patient is having slight vaginal bleeding. Per patient she had a biopsy done about two weeks ago and she is "supposed to have a D&C at some point". MD made aware. Will continue to monitor patient.

## 2015-08-19 NOTE — ED Notes (Signed)
[name], RN given report.

## 2015-08-19 NOTE — ED Provider Notes (Signed)
CSN: [id number]     Arrival date & time 08/18/15  1951 History   First MD Initiated Contact with Patient 08/18/15 2025     Chief Complaint  Patient presents with  . Weakness     (Consider location/radiation/quality/duration/timing/severity/associated sxs/prior Treatment) HPI Comments: 80 year old female with history of hyperlipidemia, dyspnea, B-cell lymphoma, recent uterine biopsy presents for weakness. Patient states that she has not been feeling well. She said she just generally has felt weak over the last few days. She said that she has only been able to lay around and has not been up to doing activities. She says that she is being evaluated for possible recurrence of cancer outpatient and that she had a biopsy done of her uterus recently. She denies cough or shortness of breath. No nausea, vomiting, diarrhea. No rashes. No headache. She felt like she had the flu last week.  Patient is a 80 y.o. female presenting with weakness.  Weakness Pertinent negatives include no chest pain, no abdominal pain, no headaches and no shortness of breath.    Past Medical History  Diagnosis Date  . Hyperlipidemia     takes Atorvastatin daily  . Chronic headache   . Osteoarthritis   . Chronic chest pain   . Hypertension     takes Quinapril daily  . Low back pain   . GERD (gastroesophageal reflux disease)     takes Nexium daily  . History of colon polyps     hyperplastic polyp's  2003;  2006;  2011  . Depression     takes Elavil nightly  . Dyspnea   . Anxiety     takes Ativan daily  . History of panic attacks   . Hiatal hernia   . History of small bowel obstruction     08/ 2014  parital sbo  resolved without surgical intervention  . History of colon polyps     hyperplastic 2003,  2006,  2011  . History of breast cancer oncologist-  dr Marin Olp--  no recurrence    dx 2002-- right breast Stage 1/2 lobular,  ER+/HER2 negative /  s/p  right mastectomy w/ node dissection and chemotherapy  .  Bilateral carotid artery stenosis     per last duplex --  RICA and LICA  11-91-%  . PMB (postmenopausal bleeding)   . Thickened endometrium   . CAD (coronary artery disease)     cardiologist-  dr Stanford Breed  . OSA (obstructive sleep apnea)     mild per study 10-14-2007-  cpap intolerent, uses O2 supplement at night  . Diffuse large cell non-Hodgkin's lymphoma [facility]) oncologist-  dr Marin Olp-  per last office note being monitored for possible recurrence    dx 03/ 2015 by lymph node bx/  Stage 3-  completed chemotherapy  . S/P drug eluting coronary stent placement     pLAD 07-09-2009   Past Surgical History  Procedure Laterality Date  . Tonsillectomy    . Inner ear surgery    . Submandibular gland excision Left 05/10/2013    Procedure: EXCISION LEFT SUBMANDIBULAR NODE;  Surgeon: Jodi Marble, MD;  Location: [place];  Service: ENT;  Laterality: Left;  . Portacath placement  06/04/13    and removal of previous pac  . Mastectomy with axillary lymph node dissection Right 01-03-2001  . Cholecystectomy  2005  . Cataract extraction w/ intraocular lens  implant, bilateral  2011  . Knee arthroscopy Right   . Coronary angioplasty with stent placement  07-09-2009  dr Darnell Level  brodie    PCI and DES x1 to  pLAD/  normal LVF, ef 60%  . Cardiac catheterization  07-15-2010   dr Lorine Bears    no evidence obstructive cad/  patent LAD stent with no significant restenosis/  normal LVF  . Transthoracic echocardiogram  10-04-2006    ef 55-605/  trivial MR and TR/  mild AV sclerosis without stenosis (valve area 1.93cm^2)  . Cardiovascular stress test  12-24-2009    normal nuclear study w/ no ischemia/  normal LV function and wall motion, ef 72%  . Colonoscopy with esophagogastroduodenoscopy (egd)  11-12-2014    polypectomy /  normal egd   History reviewed. No pertinent family history. Social History  Substance Use Topics  . Smoking status: Never Smoker   . Smokeless tobacco: Never Used  . Alcohol Use: No    OB History    No data available     Review of Systems  Constitutional: Positive for fatigue. Negative for fever and chills.  HENT: Negative for congestion, postnasal drip, rhinorrhea and sinus pressure.   Eyes: Negative for visual disturbance.  Respiratory: Negative for cough, chest tightness and shortness of breath.   Cardiovascular: Negative for chest pain, palpitations and leg swelling.  Gastrointestinal: Negative for nausea, vomiting, abdominal pain and diarrhea.  Genitourinary: Negative for dysuria, urgency, frequency and flank pain.  Musculoskeletal: Negative for myalgias and back pain.  Skin: Negative for rash and wound.  Neurological: Positive for weakness (generalized). Negative for dizziness, speech difficulty and headaches.  Hematological: Does not bruise/bleed easily.      Allergies  Review of patient's allergies indicates no known allergies.  Home Medications   Prior to Admission medications   Medication Sig Start Date End Date Taking? Authorizing Provider  acetaminophen (TYLENOL) 500 MG tablet Take 500-1,000 mg by mouth every 6 (six) hours as needed (for pain.).   Yes Historical Provider, MD  amitriptyline (ELAVIL) 100 MG tablet Take 100 mg by mouth at bedtime.   Yes Historical Provider, MD  aspirin 81 MG EC tablet Take 81 mg by mouth every morning.    Yes Historical Provider, MD  atorvastatin (LIPITOR) 80 MG tablet Take 1 tablet (80 mg total) by mouth daily. 06/02/15  Yes Lewayne Bunting, MD  cholecalciferol (VITAMIN D) 1000 UNITS tablet Take 1,000 Units by mouth every morning.    Yes Historical Provider, MD  Cinnamon 500 MG TABS Take 1,000 mg by mouth every morning.    Yes Historical Provider, MD  GARLIC PO Take 1 tablet by mouth daily.   Yes Historical Provider, MD  LORazepam (ATIVAN) 2 MG tablet Take 2 mg by mouth 2 (two) times daily.    Yes Historical Provider, MD  multivitamin-iron-minerals-folic acid (CENTRUM) chewable tablet Chew 1 tablet by mouth every  morning.    Yes Historical Provider, MD  nitroGLYCERIN (NITROSTAT) 0.4 MG SL tablet Place 1 tablet (0.4 mg total) under the tongue every 5 (five) minutes as needed for chest pain. 08/28/14  Yes Lewayne Bunting, MD  Omega-3 Fatty Acids (FISH OIL) 1000 MG CAPS Take 1,000 mg by mouth every morning.    Yes Historical Provider, MD  polyethylene glycol powder (GLYCOLAX/MIRALAX) powder Take 17 g by mouth 2 (two) times daily as needed. Patient taking differently: Take 17 g by mouth 2 (two) times daily as needed for mild constipation.  05/21/14  Yes Peyton Najjar, MD  quinapril (ACCUPRIL) 20 MG tablet Take 1 tablet (20 mg total) by mouth every morning. 06/02/15  Yes  Lelon Perla, MD   BP 100/46 mmHg  Pulse 93  Temp(Src) 97.5 F (36.4 C) (Oral)  Resp 25  Ht '5\' 5"'$  (1.651 m)  Wt 212 lb 11.9 oz (96.5 kg)  BMI 35.40 kg/m2  SpO2 92% Physical Exam  Constitutional: She is oriented to person, place, and time. She appears well-developed and well-nourished. No distress.  HENT:  Head: Normocephalic and atraumatic.  Right Ear: External ear normal.  Left Ear: External ear normal.  Nose: Nose normal.  Mouth/Throat: Oropharynx is clear and moist. No oropharyngeal exudate.  Eyes: EOM are normal. Pupils are equal, round, and reactive to light.  Neck: Normal range of motion. Neck supple.  Cardiovascular: Normal rate, regular rhythm and intact distal pulses.   Pulmonary/Chest: Effort normal. No respiratory distress. She has no wheezes. She has no rales.  Abdominal: Soft. She exhibits no distension. There is no tenderness.  Musculoskeletal: Normal range of motion. She exhibits no edema or tenderness.  Neurological: She is alert and oriented to person, place, and time. She exhibits normal muscle tone.  Skin: Skin is warm and dry. No rash noted. She is not diaphoretic.  Vitals reviewed.   ED Course  Procedures (including critical care time) Labs Review Labs Reviewed  COMPREHENSIVE METABOLIC PANEL -  Abnormal; Notable for the following:    Sodium 131 (*)    Chloride 98 (*)    CO2 17 (*)    Glucose, Bld 172 (*)    BUN 78 (*)    Creatinine, Ser 3.42 (*)    Calcium 8.3 (*)    Albumin 3.2 (*)    AST 115 (*)    ALT 83 (*)    Alkaline Phosphatase 169 (*)    GFR calc non Af Amer 12 (*)    GFR calc Af Amer 14 (*)    Anion gap 16 (*)    All other components within normal limits  CBC WITH DIFFERENTIAL/PLATELET - Abnormal; Notable for the following:    WBC 26.2 (*)    RBC 5.24 (*)    RDW 17.2 (*)    Neutro Abs 23.9 (*)    All other components within normal limits  URINALYSIS, ROUTINE W REFLEX MICROSCOPIC (NOT AT [facility]) - Abnormal; Notable for the following:    Color, Urine AMBER (*)    APPearance CLOUDY (*)    Bilirubin Urine SMALL (*)    All other components within normal limits  PROTIME-INR - Abnormal; Notable for the following:    Prothrombin Time 15.4 (*)    All other components within normal limits  TROPONIN I - Abnormal; Notable for the following:    Troponin I 0.04 (*)    All other components within normal limits  LACTIC ACID, PLASMA - Abnormal; Notable for the following:    Lactic Acid, Venous 2.2 (*)    All other components within normal limits  PROTIME-INR - Abnormal; Notable for the following:    Prothrombin Time 16.5 (*)    All other components within normal limits  CBC - Abnormal; Notable for the following:    WBC 17.8 (*)    RDW 17.5 (*)    All other components within normal limits  BASIC METABOLIC PANEL - Abnormal; Notable for the following:    Sodium 133 (*)    CO2 14 (*)    Glucose, Bld 166 (*)    BUN 75 (*)    Creatinine, Ser 3.02 (*)    Calcium 7.1 (*)    GFR calc non Af  Amer 14 (*)    GFR calc Af Amer 16 (*)    All other components within normal limits  LACTIC ACID, PLASMA - Abnormal; Notable for the following:    Lactic Acid, Venous 2.0 (*)    All other components within normal limits  I-STAT CG4 LACTIC ACID, ED - Abnormal; Notable for the  following:    Lactic Acid, Venous 3.30 (*)    All other components within normal limits  I-STAT CG4 LACTIC ACID, ED - Abnormal; Notable for the following:    Lactic Acid, Venous 3.04 (*)    All other components within normal limits  I-STAT CG4 LACTIC ACID, ED - Abnormal; Notable for the following:    Lactic Acid, Venous 2.06 (*)    All other components within normal limits  CULTURE, BLOOD (ROUTINE X 2)  MRSA PCR SCREENING  URINE CULTURE  CULTURE, BLOOD (ROUTINE X 2)  MAGNESIUM  PHOSPHORUS  CORTISOL  PROCALCITONIN  BASIC METABOLIC PANEL  LACTIC ACID, PLASMA  I-STAT TROPOININ, ED  I-STAT CG4 LACTIC ACID, ED    Imaging Review Ct Abdomen Pelvis Wo Contrast  08/19/2015  CLINICAL DATA:  Sepsis.  Elevated LFTs. EXAM: CT ABDOMEN AND PELVIS WITHOUT CONTRAST TECHNIQUE: Multidetector CT imaging of the abdomen and pelvis was performed following the standard protocol without IV contrast. COMPARISON:  PET-CT 06/30/2015. Contrast-enhanced CT abdomen/pelvis 07/10/2014 FINDINGS: Lower chest: Chronic lung disease at the bases. Linear atelectasis or scarring, right greater than left. No pleural effusion. Liver: No focal lesion allowing for lack of IV contrast. Borderline steatosis. Hepatobiliary: Clips in the gallbladder fossa postcholecystectomy. No biliary dilatation. Pancreas: Mild atrophy.  No ductal dilatation or inflammation. Spleen: Normal. Adrenal glands: No nodule. Kidneys: Symmetric in size without stones or hydronephrosis. There is no perinephric stranding. Both ureters are decompressed without stones along the course. Stomach/Bowel: Stomach is decompressed. There are no dilated or thickened small bowel loops. Liquid and solid stool throughout the colon, with increased stool burden distally. No colonic inflammation. The appendix is not confidently identified. Vascular/Lymphatic: Small retroperitoneal lymph nodes, not enlarged by size criteria. Portacaval node measures 1.9 cm, unchanged from recent  PET. Peripancreatic node measures 10 mm, unchanged. There is a prominent gastrohepatic node measuring 1 cm. This is unchanged from prior. Small central mesenteric and ileocolic nodes are again seen, largest right lower quadrant node measures 11 mm, series 2, image 41, unchanged. No evidence of new adenopathy. Atherosclerosis of normal caliber abdominal aorta. No aortic aneurysm. Reproductive: Endometrium again appears prominent for postmenopausal patient, patient has undergone recent biopsy. Left ovarian cyst measures 5.7 cm, unchanged. Bladder: Physiologically distended, no wall thickening. Other: No free air, free fluid, or intra-abdominal fluid collection. Musculoskeletal: There are no acute or suspicious osseous abnormalities. Chronic endplate changes at T12-L1, stable from prior CT. Multilevel degenerative change throughout spine. IMPRESSION: 1. Increased distal stool burden suggesting constipation. Liquid stool in the more proximal colon, can be seen with associated diarrheal illness. 2. No additional acute abnormality in the abdomen/pelvis. Abdominal lymph nodes are unchanged from recent PET/CT. 3. Postcholecystectomy without biliary dilatation. Borderline mild hepatic steatosis. 4. Endometrial thickening, recently evaluated with biopsy. Left ovarian cyst is unchanged. Electronically Signed   By: Rubye Oaks M.D.   On: 08/19/2015 00:32   Dg Chest 2 View  08/18/2015  CLINICAL DATA:  Hypotensive and tachycardic. EXAM: CHEST  2 VIEW COMPARISON:  08/11/2015 FINDINGS: There is rotational artifact aortic atherosclerosis noted. No pleural effusion or edema identified. No airspace consolidation. Mild multi level thoracic spondylosis  noted. IMPRESSION: 1. No acute cardiopulmonary abnormalities. 2. Aortic atherosclerosis. Electronically Signed   By: Kerby Moors M.D.   On: 08/18/2015 21:25   I have personally reviewed and evaluated these images and lab results as part of my medical decision-making.   EKG  Interpretation   Date/Time:  Monday August 18 2015 20:06:20 EDT Ventricular Rate:  106 PR Interval:    QRS Duration: 108 QT Interval:  358 QTC Calculation: 476 R Axis:   -58 Text Interpretation:  Sinus tachycardia LAD, consider LAFB or inferior  infarct No significant change since last tracing Confirmed by Nobie Alleyne,  Finn Amos ([id number]) on 08/18/2015 9:49:44 PM Also confirmed by Alfonse Spruce, Jonessa Triplett  [phone]), editor Bressler, CCT, GLENN ([id number])  on 08/19/2015 11:24:46 AM      MDM  Patient was seen and evaluated at bedside. Patient hypotensive, tachycardic and with an elevated lactic acid.  Code sepsis activated.  Patient started on broad spectrum antibiotics for unknown septic source.  Chest xray and UA without infectious source.  Ct abdomen/pelvis without source. Patient alert and oriented without signs of encephalopathy and no meningeal signs.  Patient was resuscitated with IV fluids but continued to be hypotensive and so critical care was consulted.  Patient was seen and evaluated by critical care and admitted under their care.  Lactic acid cleared with IV fluids. Final diagnoses:  None    1. Sepsis, unknown source  2. Hypotension    Harvel Quale, MD 08/19/15 [phone]

## 2015-08-19 NOTE — Progress Notes (Signed)
A-Line placement held at this time.

## 2015-08-19 NOTE — Progress Notes (Addendum)
PHARMACY - PHYSICIAN COMMUNICATION CRITICAL VALUE ALERT - BLOOD CULTURE IDENTIFICATION (BCID)  Results for orders placed or performed during the hospital encounter of 08/18/15  Blood Culture ID Panel (Reflexed) (Collected: 08/18/2015  8:50 PM)  Result Value Ref Range   Enterococcus species NOT DETECTED NOT DETECTED   Vancomycin resistance NOT DETECTED NOT DETECTED   Listeria monocytogenes NOT DETECTED NOT DETECTED   Staphylococcus species DETECTED (A) NOT DETECTED   Staphylococcus aureus NOT DETECTED NOT DETECTED   Methicillin resistance NOT DETECTED NOT DETECTED   Streptococcus species NOT DETECTED NOT DETECTED   Streptococcus agalactiae NOT DETECTED NOT DETECTED   Streptococcus pneumoniae NOT DETECTED NOT DETECTED   Streptococcus pyogenes NOT DETECTED NOT DETECTED   Acinetobacter baumannii NOT DETECTED NOT DETECTED   Enterobacteriaceae species NOT DETECTED NOT DETECTED   Enterobacter cloacae complex NOT DETECTED NOT DETECTED   Escherichia coli NOT DETECTED NOT DETECTED   Klebsiella oxytoca NOT DETECTED NOT DETECTED   Klebsiella pneumoniae NOT DETECTED NOT DETECTED   Proteus species NOT DETECTED NOT DETECTED   Serratia marcescens NOT DETECTED NOT DETECTED   Carbapenem resistance NOT DETECTED NOT DETECTED   Haemophilus influenzae NOT DETECTED NOT DETECTED   Neisseria meningitidis NOT DETECTED NOT DETECTED   Pseudomonas aeruginosa NOT DETECTED NOT DETECTED   Candida albicans NOT DETECTED NOT DETECTED   Candida glabrata NOT DETECTED NOT DETECTED   Candida krusei NOT DETECTED NOT DETECTED   Candida parapsilosis NOT DETECTED NOT DETECTED   Candida tropicalis NOT DETECTED NOT DETECTED    Name of physician (or Provider) Contacted: Dr Alva Garnet  Changes to prescribed antibiotics required: MD addressing  Dorrene German 08/19/2015  11:06 PM

## 2015-08-19 NOTE — ED Notes (Signed)
Pt returned from CT °

## 2015-08-19 NOTE — Progress Notes (Signed)
Date:  August 19, 2015 Chart reviewed for concurrent status and case management needs. Will continue to follow the patient for changes and needs:  Remains hypotensive with iv fld challenge. Velva Harman, BSN, [place], [place]

## 2015-08-19 NOTE — Progress Notes (Addendum)
2 RT's attempted Arterial line without success. RN aware. On coming RT aware to try placement of Arterial line.

## 2015-08-20 ENCOUNTER — Inpatient Hospital Stay (HOSPITAL_COMMUNITY): Payer: PPO | Admitting: Certified Registered Nurse Anesthetist

## 2015-08-20 DIAGNOSIS — I959 Hypotension, unspecified: Secondary | ICD-10-CM

## 2015-08-20 DIAGNOSIS — A419 Sepsis, unspecified organism: Secondary | ICD-10-CM

## 2015-08-20 LAB — GLUCOSE, CAPILLARY
GLUCOSE-CAPILLARY: 138 mg/dL — AB (ref 65–99)
GLUCOSE-CAPILLARY: 170 mg/dL — AB (ref 65–99)
GLUCOSE-CAPILLARY: 234 mg/dL — AB (ref 65–99)
GLUCOSE-CAPILLARY: 148 mg/dL — AB (ref 65–99)
Glucose-Capillary: 187 mg/dL — ABNORMAL HIGH (ref 65–99)
Glucose-Capillary: 182 mg/dL — ABNORMAL HIGH (ref 65–99)

## 2015-08-20 LAB — URINE CULTURE: CULTURE: NO GROWTH

## 2015-08-20 LAB — BASIC METABOLIC PANEL
ANION GAP: 10 (ref 5–15)
ANION GAP: 6 (ref 5–15)
BUN: 66 mg/dL — AB (ref 6–20)
BUN: 44 mg/dL — ABNORMAL HIGH (ref 6–20)
CALCIUM: 6.7 mg/dL — AB (ref 8.9–10.3)
CO2: 11 mmol/L — AB (ref 22–32)
CO2: 14 mmol/L — ABNORMAL LOW (ref 22–32)
Calcium: 7.1 mg/dL — ABNORMAL LOW (ref 8.9–10.3)
Chloride: 111 mmol/L (ref 101–111)
Chloride: 113 mmol/L — ABNORMAL HIGH (ref 101–111)
Creatinine, Ser: 2.08 mg/dL — ABNORMAL HIGH (ref 0.44–1.00)
Creatinine, Ser: 1.49 mg/dL — ABNORMAL HIGH (ref 0.44–1.00)
GFR calc Af Amer: 25 mL/min — ABNORMAL LOW (ref >60)
GFR calc Af Amer: 38 mL/min — ABNORMAL LOW (ref >60)
GFR calc non Af Amer: 22 mL/min — ABNORMAL LOW (ref >60)
GFR, EST NON AFRICAN AMERICAN: 32 mL/min — AB (ref >60)
GLUCOSE: 186 mg/dL — AB (ref 65–99)
GLUCOSE: 158 mg/dL — AB (ref 65–99)
POTASSIUM: 4 mmol/L (ref 3.5–5.1)
Potassium: 6.4 mmol/L (ref 3.5–5.1)
Sodium: 132 mmol/L — ABNORMAL LOW (ref 135–145)
Sodium: 133 mmol/L — ABNORMAL LOW (ref 135–145)

## 2015-08-20 LAB — TROPONIN I: TROPONIN I: 0.03 ng/mL — AB (ref <0.03)

## 2015-08-20 MED ORDER — PIPERACILLIN-TAZOBACTAM 3.375 G IVPB
3.3750 g | Freq: Three times a day (TID) | INTRAVENOUS | Status: DC
  Administered 2015-08-20 – 2015-08-21 (×3): 3.375 g via INTRAVENOUS
  Filled 2015-08-20 (×3): qty 50

## 2015-08-20 MED ORDER — SODIUM CHLORIDE 0.9 % IV BOLUS (SEPSIS)
1000.0000 mL | Freq: Once | INTRAVENOUS | Status: AC
  Administered 2015-08-20: 1000 mL via INTRAVENOUS

## 2015-08-20 MED ORDER — LORAZEPAM 0.5 MG PO TABS
0.5000 mg | ORAL_TABLET | Freq: Two times a day (BID) | ORAL | Status: DC
  Administered 2015-08-20 – 2015-09-01 (×25): 0.5 mg via ORAL
  Filled 2015-08-20 (×25): qty 1

## 2015-08-20 MED ORDER — DEXTROSE 5 % IV SOLN
0.0000 ug/min | INTRAVENOUS | Status: DC
  Administered 2015-08-20: 40 ug/min via INTRAVENOUS
  Administered 2015-08-20: 75 ug/min via INTRAVENOUS
  Administered 2015-08-20: 100 ug/min via INTRAVENOUS
  Filled 2015-08-20 (×7): qty 1

## 2015-08-20 NOTE — Procedures (Signed)
Arterial Catheter Insertion Procedure Note Waynesha Besch Davoli [id number] [birth year]  Procedure: Insertion of Arterial Catheter  Indications: Blood pressure monitoring and Frequent blood sampling  Procedure Details Consent: Risks of procedure as well as the alternatives and risks of each were explained to the (patient/caregiver).  Consent for procedure obtained. Time Out: Verified patient identification, verified procedure, site/side was marked, verified correct patient position, special equipment/implants available, medications/allergies/relevent history reviewed, required imaging and test results available.  Performed  Maximum sterile technique was used including antiseptics, cap, gloves, gown, hand hygiene, mask and sheet. Skin prep: Chlorhexidine; local anesthetic administered 20 gauge catheter was inserted into right femoral artery using the Seldinger technique.  Evaluation Blood flow good; BP tracing good. Complications: No apparent complications.   Procedure performed under direct supervision of Dr. Chase Caller and with ultrasound guidance for real time vessel cannulation.     Noe Gens, NP-C [facility] Pgr: [phone] or if no answer [phone] 08/20/2015, 2:40 PM

## 2015-08-20 NOTE — Progress Notes (Signed)
Pharmacy Antibiotic Note  [name] is a 80 y.o. female admitted on 08/18/2015 with sepsis.  Pharmacy has been consulted for Vancomycin and Zosyn dosing.  SCr improved 7/4 PM, however, labs have been unable to be collected 7/5.  She has no venous access despite multiple attempts.  A-line order remains pending.  Plan:  Zosyn 3.375g IV Q8H infused over 4hrs.  Continue Vancomycin 1g IV q48h.  Measure Vanc trough at steady state.  Follow up renal fxn, culture results, and clinical course.    Height: 5\' 5"  (165.1 cm) Weight: 228 lb 2.8 oz (103.5 kg) IBW/kg (Calculated) : 57  Temp (24hrs), Avg:97.7 F (36.5 C), Min:97 F (36.1 C), Max:98.3 F (36.8 C)   Recent Labs Lab 08/18/15 2015  08/18/15 2221 08/19/15 0128 08/19/15 0214 08/19/15 0815 08/19/15 1458 08/19/15 1543  WBC 26.2*  --   --   --  17.8*  --   --   --   CREATININE 3.42*  --   --   --  3.02*  --   --  2.08*  LATICACIDVEN  --   < > 3.04* 2.06* 2.2* 2.0* 1.9  --   < > = values in this interval not displayed.  Estimated Creatinine Clearance: 26.6 mL/min (by C-G formula based on Cr of 2.08).    No Known Allergies  Antimicrobials this admission: 7/3 Zosyn >> 7/3 Vancomycin >>  Dose adjustments this admission:  Microbiology results: 7/3 BCx: 1/2 cxt GPC clusters. BCID: Staph species (Oxacillin sens CoNS, contaminant?) 7/3 UCx: sent  7/4 MRSA PCR: neg  Thank you for allowing pharmacy to be a part of this patient's care.  Gretta Arab PharmD, BCPS Pager [phone] 08/20/2015 7:27 AM

## 2015-08-20 NOTE — Progress Notes (Signed)
Two lab techs attempted twice to get morning labs. Lab draw attempts were unsuccessful. MD was paged to notify. Oncoming RN was also notified.

## 2015-08-20 NOTE — Progress Notes (Signed)
eLink Physician-Brief Progress Note Patient Name: [name] DOB: [birth year] MRN: [id number]   Date of Service  08/20/2015  HPI/Events of Note  BP low. Asymptomatic but low Uo (no Foley). Dr Golden Pop note indicates plan for phenylephrine but this was never started. Did not respond to NS bolus  eICU Interventions  Phenylephrine infusion ordered - MAP goal 60 mmHg        Merton Border 08/20/2015, 12:01 AM

## 2015-08-20 NOTE — Progress Notes (Signed)
PULMONARY / CRITICAL CARE MEDICINE   Name: [name] MRN: [id number] DOB: 03-11-1935    ADMISSION DATE:  08/18/2015 CONSULTATION DATE:  [name]  REFERRING MD:  [name] EDP  CHIEF COMPLAINT:    HISTORY OF PRESENT ILLNESS:  80 year old female with PMH as below, which includes OSA on nocturnal O2 (followed by CY), HTN, Breast Ca, large cell non-Hodgkins lymphoma s/p chemotherapy 2015 (6 cycles Rituxan/bendamustine). She follows with Dr. Marin [name] who describes her performance status as poor. Recent PET scan has shown some activity in upper abdominal and and bilateral hilar lymph nodes. She has been asymptomatic, but also had some endometrial thickening. She was referred to gynecology and had endometrial biopsy which was negative. She was also scheduled for [facility] 7/11.  However, before this could be done she presented to the emergency department 7/3. She was at home where she felt as though she may have had the flu last week, but had been feeling better. Her home health aide was supposed to come 7/3, but did not, and for this reason she did not eat/drink much and had difficulty making it to the bathroom. She was incontinent of stool and for that reason called EMS. She required EMS help sitting up on couch. While moving her they got some stool on her chair (which is honestly her chief complaint at the time of my evaluation, asking me for recommendations of cleaners and if I have any experience with [name]). In the emergency department she was noted to have low grade fever and WBC elevation to 26.2. Lactic acid and Creatinine also mildly elevated. She remained hypotensive as well, despite 2L IVF resuscitation. PCCM asked to evaluate.   SUBJECTIVE:  RN reports pt asking for home ativan, pt states we are "being mean" by not giving it to her.  Unable to place an aline overnight per RT & anesthesia.  Labs not completed as unable to draw.  Remains on neo @ 100 mcg, BP being assessed on calf.   VITAL  SIGNS: BP 69/21 mmHg  Pulse 94  Temp(Src) 98.1 F (36.7 C) (Oral)  Resp 22  Ht 5\' 5"  (1.651 m)  Wt 228 lb 2.8 oz (103.5 kg)  BMI 37.97 kg/m2  SpO2 97%  HEMODYNAMICS:    VENTILATOR SETTINGS:    INTAKE / OUTPUT: I/O last 3 completed shifts: In: 7148.7 [I.V.:3948.7; IV Piggyback:3200] Out: 1075 [Urine:1075]  PHYSICAL EXAMINATION: General:  Obese deconditioned appearing female in NAD Neuro:  Alert, oriented x 3, non-focal HEENT:  [place]/AT, PERRL, no appreciable JVD Cardiovascular:  RRR< no MRG Lungs:  Clear bilateral breath sounds Abdomen:  Soft, non-tender, non-distended Musculoskeletal:  No acute deformity or ROM limitation Skin:  Grossly intact   LABS:  BMET  Recent Labs Lab 08/18/15 2015 08/19/15 0214 08/19/15 1543  NA 131* 133* 132*  K 4.2 4.1 6.4*  CL 98* 105 111  CO2 17* 14* 11*  BUN 78* 75* 66*  CREATININE 3.42* 3.02* 2.08*  GLUCOSE 172* 166* 186*    Electrolytes  Recent Labs Lab 08/18/15 2015 08/19/15 0214 08/19/15 1543  CALCIUM 8.3* 7.1* 6.7*  MG  --  2.0  --   PHOS  --  4.6  --     CBC  Recent Labs Lab 08/18/15 2015 08/19/15 0214  WBC 26.2* 17.8*  HGB 14.5 12.3  HCT 41.9 36.4  PLT 185 173    Coag's  Recent Labs Lab 08/18/15 2218 08/19/15 0214  INR 1.25 1.37    Sepsis Markers  Recent Labs Lab 08/19/15 0214 08/19/15 0810 08/19/15 0815 08/19/15 1458  LATICACIDVEN 2.2*  --  2.0* 1.9  PROCALCITON  --  0.76  --   --     ABG No results for input(s): PHART, PCO2ART, PO2ART in the last 168 hours.  Liver Enzymes  Recent Labs Lab 08/18/15 2015  AST 115*  ALT 83*  ALKPHOS 169*  BILITOT 1.1  ALBUMIN 3.2*    Cardiac Enzymes  Recent Labs Lab 08/19/15 0214  TROPONINI 0.04*    Glucose  Recent Labs Lab 08/19/15 1550 08/19/15 1635 08/19/15 2023 08/19/15 2209 08/20/15 0135 08/20/15 0609  GLUCAP 324* 172* 171* 158* 138* 170*    Imaging No results found.   STUDIES:  CT abdo/pelv 7/4 > increased  distal stool burden, no additional acute abnormality  CULTURES: BCx2 7/3 >> 1/2 staph species >>  Urine 7/3 >> negative  ANTIBIOTICS: Zosyn 7/4 >> Vancomycin 7/4 >>  SIGNIFICANT EVENTS: 7/03 Admit 7/05 Remains on neo, difficult access  LINES/TUBES: PIV  DISCUSSION: 80 year old female presenting to ED after episode of incontinence at home. She was noted to be hypotensive in ED. She reports that normal BP low 100s. Lactic, Creat and WBC elevated so concern for sepsis. Remained hypotensive despite IVF. Will admit to ICU for further monitoring. May need CVL and pressors, however, her mental status is wnl and she is making urine. Will not rush to treat low BP measured on calf. Will continue to hydrate and if MS or UOP decline will consider vasopressors. Give IV ABX with WBC and low grade fever. Given her cancer history and recent PET changes, unclear if source of WBC is infectious.   ASSESSMENT / PLAN:  PULMONARY A: OSA on nocturnal O2 (could not tol CPAP)  P:   Continue nocturnal O2 SpO2 goal 90-95% Pulmonary hygeine  CARDIOVASCULAR A:  Hypotension > unclear etiology hypovolemia vs sepsis. Also suspect measurement is inadequate.  Elevated lactic acid H/o HTN, CAD (s/p PCI LAD 2011), carotid stenosis  P:  MAP goal > 23mmHg NS at 176mm/Hr Additional NS bolus Place art-line pm 7/5 Holding outpatient quinapril Trend lactic  RENAL A:   AKI Hyponatremia  P:   Ensure adequate hydration Follow BMP / UOP  Correct electrolytes as indicated  GASTROINTESTINAL A:   Stool incontinence Constipation  P:   Protonix PO for SUP Advance diet as tolerated Miralax per home regimen   HEMATOLOGIC A:   Leukocytosis - unclear if infectious Hx breast Ca, also non-hidgkin's s/p chemo 2015 current remission Recent PET changes as above and endometrial thickening with reportedly negative endometrial biospy  P:  Follow CBC Heparin for VTE ppx  INFECTIOUS A:   SIRS ? Sepsis,  no obvious source.  1/2 BC with staph, suspect contaminant.   P:   Broad ABX for now Follow cultures Low threshold to narrow  ENDOCRINE A:   No acute issues  P:   Monitor glucose on bmp  NEUROLOGIC A:   Depression Anxiety  P:   RASS goal: 0 Close monitoring Resume home ativan at reduced dose to prevent withdrawal   FAMILY  - Updates: No family available.  Patient updated on plan of care at bedside 7/5.  - Inter-disciplinary family meet or Palliative Care meeting due by:  7/10   Noe Gens, NP-C [place] Pgr: [phone] or if no answer [phone] 08/20/2015, 10:57 AM

## 2015-08-20 NOTE — Addendum Note (Signed)
Addendum  created 08/20/15 0836 by West Pugh, CRNA   Modules edited: Charges VN

## 2015-08-20 NOTE — Progress Notes (Signed)
CSW consulted for homelessness. Pt reviewed in rounds. Pt resides, alone, in an apartment. NSG reports that, according to pt's friend, pt is having difficulty managing her own care and maintaining her apartment. PT / OT evals requested, when medically appropriate. CSW will assist with d/c planning, as needed.  Werner Lean LCSW [phone]

## 2015-08-20 NOTE — Progress Notes (Signed)
eLink Physician-Brief Progress Note Patient Name: [name] DOB: February 12, 1936 MRN: [id number]   Date of Service  08/20/2015  HPI/Events of Note  K+ = 6.4. Moderate hemolysis report on specimen.  eICU Interventions  Will recheck BMP.     Intervention Category Major Interventions: Electrolyte abnormality - evaluation and management  Rennae Ferraiolo [place] 08/20/2015, 6:07 PM

## 2015-08-20 NOTE — Progress Notes (Signed)
REVIEWED CHART AND NOTED PT INPATIENT AT [facility] W/ SEPSIS.  CALLED AND SPOKE TO HEATHER AT DR [facility] OFFICE TO [place] HIM.

## 2015-08-20 NOTE — Anesthesia Procedure Notes (Signed)
Anesthesia Procedure Note Attempted to place arterial line with 2 atttempts under ultrasound. Allen's test performed on left arm and positive for collateral circulation. Not successful with arterial placement. Dr Tresa Moore aware.

## 2015-08-20 NOTE — Progress Notes (Signed)
Chaplain responded to a [place] in [name]'s chart dated 5 July. [name] is lonely and needed at listening ear and presence. 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 She had spiritual concerns she wished to speak with the chaplain about. These concerns were discussed openly during the visit.  Page the Chaplain if [name] needs or requests further Spiritual Care.  [name]. [name], [place]

## 2015-08-20 NOTE — Procedures (Signed)
RN notified that RT's attempted to placed a-line x 2 on 08/19/2015.  RT will not attempt to place a-line again.

## 2015-08-21 DIAGNOSIS — I959 Hypotension, unspecified: Secondary | ICD-10-CM

## 2015-08-21 DIAGNOSIS — A419 Sepsis, unspecified organism: Principal | ICD-10-CM

## 2015-08-21 LAB — CBC WITH DIFFERENTIAL/PLATELET
Basophils Absolute: 0 10*3/uL (ref 0.0–0.1)
Basophils Relative: 0 %
Eosinophils Absolute: 0.1 10*3/uL (ref 0.0–0.7)
Eosinophils Relative: 0 %
HEMATOCRIT: 35.4 % — AB (ref 36.0–46.0)
HEMOGLOBIN: 12 g/dL (ref 12.0–15.0)
LYMPHS ABS: 2.2 10*3/uL (ref 0.7–4.0)
Lymphocytes Relative: 10 %
MCH: 27.5 pg (ref 26.0–34.0)
MCHC: 33.9 g/dL (ref 30.0–36.0)
MCV: 81.2 fL (ref 78.0–100.0)
MONOS PCT: 8 %
Monocytes Absolute: 1.7 10*3/uL — ABNORMAL HIGH (ref 0.1–1.0)
NEUTROS ABS: 18.3 10*3/uL — AB (ref 1.7–7.7)
NEUTROS PCT: 82 %
Platelets: 229 10*3/uL (ref 150–400)
RBC: 4.36 MIL/uL (ref 3.87–5.11)
RDW: 18.1 % — ABNORMAL HIGH (ref 11.5–15.5)
WBC: 22.2 10*3/uL — ABNORMAL HIGH (ref 4.0–10.5)

## 2015-08-21 LAB — BASIC METABOLIC PANEL
Anion gap: 4 — ABNORMAL LOW (ref 5–15)
BUN: 43 mg/dL — AB (ref 6–20)
CHLORIDE: 114 mmol/L — AB (ref 101–111)
CO2: 15 mmol/L — AB (ref 22–32)
CREATININE: 1.37 mg/dL — AB (ref 0.44–1.00)
Calcium: 7 mg/dL — ABNORMAL LOW (ref 8.9–10.3)
GFR calc non Af Amer: 36 mL/min — ABNORMAL LOW (ref >60)
GFR, EST AFRICAN AMERICAN: 42 mL/min — AB (ref >60)
Glucose, Bld: 155 mg/dL — ABNORMAL HIGH (ref 65–99)
Potassium: 4.4 mmol/L (ref 3.5–5.1)
Sodium: 133 mmol/L — ABNORMAL LOW (ref 135–145)

## 2015-08-21 LAB — PHOSPHORUS: Phosphorus: 3 mg/dL (ref 2.5–4.6)

## 2015-08-21 LAB — PROCALCITONIN: Procalcitonin: 0.23 ng/mL

## 2015-08-21 LAB — GLUCOSE, CAPILLARY
GLUCOSE-CAPILLARY: 144 mg/dL — AB (ref 65–99)
Glucose-Capillary: 174 mg/dL — ABNORMAL HIGH (ref 65–99)
Glucose-Capillary: 150 mg/dL — ABNORMAL HIGH (ref 65–99)
Glucose-Capillary: 133 mg/dL — ABNORMAL HIGH (ref 65–99)
Glucose-Capillary: 172 mg/dL — ABNORMAL HIGH (ref 65–99)
Glucose-Capillary: 181 mg/dL — ABNORMAL HIGH (ref 65–99)

## 2015-08-21 LAB — MAGNESIUM: MAGNESIUM: 1.4 mg/dL — AB (ref 1.7–2.4)

## 2015-08-21 MED ORDER — TRAMADOL HCL 50 MG PO TABS
25.0000 mg | ORAL_TABLET | Freq: Two times a day (BID) | ORAL | Status: DC | PRN
  Administered 2015-08-21 – 2015-08-30 (×10): 25 mg via ORAL
  Filled 2015-08-21 (×10): qty 1

## 2015-08-21 MED ORDER — MAGNESIUM SULFATE 4 GM/100ML IV SOLN
4.0000 g | Freq: Once | INTRAVENOUS | Status: AC
  Administered 2015-08-21: 4 g via INTRAVENOUS
  Filled 2015-08-21: qty 100

## 2015-08-21 MED ORDER — VANCOMYCIN HCL 10 G IV SOLR
1250.0000 mg | INTRAVENOUS | Status: DC
  Administered 2015-08-21: 1250 mg via INTRAVENOUS
  Filled 2015-08-21 (×2): qty 1250

## 2015-08-21 NOTE — Progress Notes (Signed)
CSW assisting with d/c planning. Ernest Pine from [place] / APS contacted CSW regarding pt. Ms. [name] attempted to see pt at [facility] but pt declined visit. " I don't want to speak with anyone right now."  Pt's lunch tray was just delivered and pt wanted to have her lunch. Ms. [name] will attempt to see pt tomorrow. Ms. Bettina Gavia contact info is in pt's shadow chart.   Werner Lean LCSW [phone]

## 2015-08-21 NOTE — Clinical Social Work Note (Signed)
Clinical Social Work Assessment  Patient Details  Name: [name] MRN: [id number] Date of Birth: 1935/08/22  Date of referral:  08/21/15               Reason for consult:  Discharge Planning                Permission sought to share information with:    Permission granted to share information::     Name::        Agency::     Relationship::     Contact Information:     Housing/Transportation Living arrangements for the past 2 months:  Apartment Source of Information:  Patient Patient Interpreter Needed:  None Criminal Activity/Legal Involvement Pertinent to Current Situation/Hospitalization:  No - Comment as needed Significant Relationships:  Friend, Adult Children Lives with:    Do you feel safe going back to the place where you live?  Yes Need for family participation in patient care:  No (Coment)  Care giving concerns:  Pt 's friend, [name], [phone], shared concerns with nsg. Friend reported that pt was managing poorly at home. Friend reported that pt was incontinent and unable to ambulate.   Social Worker assessment / plan:  Pt hospitalized on 08/18/15 with weakness / hypotension. PN reviewed. CSW met with pt to assist with d/c planning. Pt is alert and oriented x 4. Pt reports that she lives alone in an apt and has [employer] services. Pt reports that she uses a wc at home. Pt reports that she has a son in [place] but doesn't have much contact with him. Pt reports that she does have a friend, [name], that is helpful. Pt reports that she has been to [facility] in the past and recommends that CSW never send anyone there for rehab. " It's a terrible place and the food is bad. " Pt is aware PT / OT will see her, when medically appropriate, and make d/c recommendations. CSW will return to review recommendations with pt and continue to assist with d/c planning.  Employment status:  Retired Nurse, adult PT Recommendations:  Not  assessed at this time Information / Referral to community resources:     Patient/Family's Response to care:  D/C needs have not yet been determined. PT / OT will be ordered when medically appropriate.  Patient/Family's Understanding of and Emotional Response to Diagnosis, Current Treatment, and Prognosis:  Pt reports that she is feeling a little better. Pt is concerned with the cost of hospitalization. " What's the difference in the cost of a bed in ICU and a bed on a regular unit? " CSW was unable to answer this question but did provide pt with reading glasses. Pt reports that she will call her insurance company ( now that she can read the small print on back of card ) and have her questions answered.  Emotional Assessment Appearance:  Appears stated age Attitude/Demeanor/Rapport:  Other (cooperative) Affect (typically observed):  Calm, Appropriate, Pleasant Orientation:  Oriented to Self, Oriented to Place, Oriented to  Time, Oriented to Situation Alcohol / Substance use:  Not Applicable Psych involvement (Current and /or in the community):  No (Comment)  Discharge Needs  Concerns to be addressed:  Discharge Planning Concerns, Home Safety Concerns Readmission within the last 30 days:  No Current discharge risk:  Lack of support system Barriers to Discharge:  No Barriers Identified   Loraine Maple  [phone] 08/21/2015, 11:33 AM

## 2015-08-21 NOTE — Progress Notes (Signed)
PULMONARY / CRITICAL CARE MEDICINE   Name: [name] MRN: [id number] DOB: May 24, 1935    ADMISSION DATE:  08/18/2015 CONSULTATION DATE:  [name]  REFERRING MD:  [name] EDP  CHIEF COMPLAINT:    HISTORY OF PRESENT ILLNESS:  80 year old female with PMH as below, which includes OSA on nocturnal O2 (followed by [name]), HTN, Breast Ca, large cell non-Hodgkins lymphoma s/p chemotherapy 2015 (6 cycles Rituxan/bendamustine). She follows with [name] who describes her performance status as poor. Recent PET scan has shown some activity in upper abdominal and and bilateral hilar lymph nodes. She has been asymptomatic, but also had some endometrial thickening. She was referred to gynecology and had endometrial biopsy which was negative. She was also scheduled for [facility] 7/11.  However, before this could be done she presented to the emergency department 7/3. She was at home where she felt as though she may have had the flu last week, but had been feeling better. Her home health aide was supposed to come 7/3, but did not, and for this reason she did not eat/drink much and had difficulty making it to the bathroom. She was incontinent of stool and for that reason called EMS. She required EMS help sitting up on couch. While moving her they got some stool on her chair (which is honestly her chief complaint at the time of my evaluation, asking me for recommendations of cleaners and if I have any experience with [name]). In the emergency department she was noted to have low grade fever and WBC elevation to 26.2. Lactic acid and Creatinine also mildly elevated. She remained hypotensive as well, despite 2L IVF resuscitation. PCCM asked to evaluate.   SUBJECTIVE:  Pt reports she did not sleep last night.  Denies pain / SOB.  States she hadn't been eating / drinking well for approximately one week before admit.  Relays repetitive concerns over [place] bombing the [place].     VITAL SIGNS: BP 111/60 mmHg  Pulse  95  Temp(Src) 97.8 F (36.6 C) (Axillary)  Resp 23  Ht 5\' 5"  (1.651 m)  Wt 227 lb 4.7 oz (103.1 kg)  BMI 37.82 kg/m2  SpO2 90%  HEMODYNAMICS:    VENTILATOR SETTINGS:    INTAKE / OUTPUT: I/O last 3 completed shifts: In: 4564.5 [I.V.:4164.5; IV Piggyback:400] Out: 850 [Urine:850]  PHYSICAL EXAMINATION: General:  Obese deconditioned appearing female in NAD Neuro:  Alert, oriented x 3, non-focal HEENT:  [place]/AT, PERRL, no appreciable JVD Cardiovascular:  RRR< no MRG, aline with good waveform  Lungs:  Clear bilateral breath sounds Abdomen:  Soft, non-tender, non-distended Musculoskeletal:  No acute deformity or ROM limitation Skin:  Grossly intact   LABS:  BMET  Recent Labs Lab 08/19/15 1543 08/20/15 1830 08/21/15 0500  NA 132* 133* 133*  K 6.4* 4.0 4.4  CL 111 113* 114*  CO2 11* 14* 15*  BUN 66* 44* 43*  CREATININE 2.08* 1.49* 1.37*  GLUCOSE 186* 158* 155*    Electrolytes  Recent Labs Lab 08/19/15 0214 08/19/15 1543 08/20/15 1830 08/21/15 0500  CALCIUM 7.1* 6.7* 7.1* 7.0*  MG 2.0  --   --  1.4*  PHOS 4.6  --   --  3.0    CBC  Recent Labs Lab 08/18/15 2015 08/19/15 0214 08/21/15 0500  WBC 26.2* 17.8* 22.2*  HGB 14.5 12.3 12.0  HCT 41.9 36.4 35.4*  PLT 185 173 229    Coag's  Recent Labs Lab 08/18/15 2218 08/19/15 0214  INR 1.25 1.37  Sepsis Markers  Recent Labs Lab 08/19/15 0214 08/19/15 0810 08/19/15 0815 08/19/15 1458 08/21/15 0500  LATICACIDVEN 2.2*  --  2.0* 1.9  --   PROCALCITON  --  0.76  --   --  0.23    ABG No results for input(s): PHART, PCO2ART, PO2ART in the last 168 hours.  Liver Enzymes  Recent Labs Lab 08/18/15 2015  AST 115*  ALT 83*  ALKPHOS 169*  BILITOT 1.1  ALBUMIN 3.2*    Cardiac Enzymes  Recent Labs Lab 08/19/15 0214 08/20/15 1500  TROPONINI 0.04* 0.03*    Glucose  Recent Labs Lab 08/20/15 1159 08/20/15 1613 08/20/15 2006 08/20/15 2323 08/21/15 0314 08/21/15 0757  GLUCAP  234* 182* 148* 174* 150* 133*    Imaging No results found.   STUDIES:  CT abdo/pelv 7/4 > increased distal stool burden, no additional acute abnormality  CULTURES: BCx2 7/3 >> 1/2 staph species >>  Urine 7/3 >> negative  ANTIBIOTICS: Zosyn 7/4 >> Vancomycin 7/4 >>  SIGNIFICANT EVENTS: 7/03 Admit 7/05 Remains on neo, difficult access.  Aline placed, inaccurate cuff pressures.  NEO weaned off.   LINES/TUBES: PIV  DISCUSSION: 80 year old female presenting to ED after episode of incontinence at home. She was noted to be hypotensive in ED. She reports that normal BP low 100s. Lactic, Creat and WBC elevated so concern for sepsis. Remained hypotensive despite IVF. Will admit to ICU for further monitoring. May need CVL and pressors, however, her mental status is wnl and she is making urine. Will not rush to treat low BP measured on calf. Will continue to hydrate and if MS or UOP decline will consider vasopressors. Give IV ABX with WBC and low grade fever. Given her cancer history and recent PET changes, unclear if source of WBC is infectious.   ASSESSMENT / PLAN:  PULMONARY A: OSA on nocturnal O2 (could not tol CPAP)  P:   Continue nocturnal O2 SpO2 goal 90-95% Pulmonary hygeine  CARDIOVASCULAR A:  Hypotension > unclear etiology hypovolemia vs sepsis. Also suspect measurement is inadequate.  Elevated lactic acid H/o HTN, CAD (s/p PCI LAD 2011), carotid stenosis  P:  MAP goal > 79mmHg NS at 170mm/Hr Additional NS bolus Place art-line pm 7/5 Holding outpatient quinapril Trend lactic  RENAL A:   AKI Hyponatremia  P:   Ensure adequate hydration Follow BMP / UOP  Correct electrolytes as indicated  GASTROINTESTINAL A:   Stool incontinence Constipation - noted on CT ABD.  No acute pathology Obesity   P:   Protonix PO for SUP Advance diet as tolerated Miralax per home regimen   HEMATOLOGIC A:   Leukocytosis - unclear if infectious Hx breast Ca, also  non-hidgkin's s/p chemo 2015 current remission Recent PET changes as above and endometrial thickening with reportedly negative endometrial biospy  P:  Follow CBC Heparin for VTE ppx Will need to follow up with GYN / ONC post discharge  INFECTIOUS A:   SIRS ? Sepsis, no obvious source.  1/2 BC with staph, suspect contaminant.   P:   Broad ABX for now Follow cultures Low threshold to narrow Trend PCT  ENDOCRINE A:   No acute issues  P:   Monitor glucose on bmp  NEUROLOGIC A:   Depression Anxiety  P:   RASS goal: 0 Close monitoring Continue (home) ativan at reduced dose to prevent withdrawal   FAMILY  - Updates: No family available.  Patient updated on plan of care at bedside 7/6.  - Inter-disciplinary family  meet or Palliative Care meeting due by:  7/10   Change to SDU status.    Noe Gens, NP-C [facility] Pgr: [phone] or if no answer [phone] 08/21/2015, 10:19 AM

## 2015-08-21 NOTE — Progress Notes (Signed)
Pt's blood pressure was checked in her right lower leg, due to restriction on right arm and A-line in left arm.  BP at 1235 was 119/47 in arm. 104/68 in A-line BP at 1300 was 118/49 in arm. 113/65 in A-line BP at 1330 was 123/47 in arm. 114/58 in A-line -Will continue to monitor.

## 2015-08-21 NOTE — Progress Notes (Signed)
Pharmacy Antibiotic Note  [name] is a 80 y.o. female admitted on 08/18/2015 with sepsis.  Pharmacy has been consulted for Vancomycin and Zosyn dosing.  No infectious source identified yet. 1 of 2 blood cultures collected growing Staph species, likely coag negative Staph, possible contaminant.  PCT decreased enough today to consider stopping antibiotics.  AKI resolving, warranting antibiotic adjustments.  Plan:  Adjust vancomycin to 1250 mg IV q24h.  Continue Zosyn 3.375g IV Q8H infused over 4hrs.  Follow up renal fxn, culture results, and clinical course.  Height: 5\' 5"  (165.1 cm) Weight: 227 lb 4.7 oz (103.1 kg) IBW/kg (Calculated) : 57  Temp (24hrs), Avg:97.8 F (36.6 C), Min:97.5 F (36.4 C), Max:98.2 F (36.8 C)   Recent Labs Lab 08/18/15 2015  08/18/15 2221 08/19/15 0128 08/19/15 0214 08/19/15 0815 08/19/15 1458 08/19/15 1543 08/20/15 1830 08/21/15 0500  WBC 26.2*  --   --   --  17.8*  --   --   --   --  22.2*  CREATININE 3.42*  --   --   --  3.02*  --   --  2.08* 1.49* 1.37*  LATICACIDVEN  --   < > 3.04* 2.06* 2.2* 2.0* 1.9  --   --   --   < > = values in this interval not displayed.  Estimated Creatinine Clearance: 40.3 mL/min (by C-G formula based on Cr of 1.37).    No Known Allergies  Antimicrobials this admission: 7/3 Zosyn >> 7/3 Vancomycin >>  Dose adjustments this admission: 7/5 SCr improved 7/4 pm so Zosyn dose adjusted.  Unable to get labs today, so conservatively, left Vanc dosing as initially ordered. 7/6 SCr improved, vanc adjusted from 1g q48h to 1250 mg q24h  Microbiology results: 7/3 BCx: 1/2 Staph species, reincubating.  BCID: Staph species (Oxacillin sens CoNS, contaminant?  Dr. Rosita Fire to evaluate) 7/3 UCx: NGF  7/4 MRSA PCR: neg  Thank you for allowing pharmacy to be a part of this patient's care.  Hershal Coria, PharmD, BCPS Pager: [phone] 08/21/2015 8:08 AM

## 2015-08-22 ENCOUNTER — Inpatient Hospital Stay (HOSPITAL_COMMUNITY): Payer: PPO

## 2015-08-22 ENCOUNTER — Ambulatory Visit: Payer: PPO | Admitting: Internal Medicine

## 2015-08-22 LAB — GLUCOSE, CAPILLARY
GLUCOSE-CAPILLARY: 112 mg/dL — AB (ref 65–99)
GLUCOSE-CAPILLARY: 120 mg/dL — AB (ref 65–99)
GLUCOSE-CAPILLARY: 133 mg/dL — AB (ref 65–99)
GLUCOSE-CAPILLARY: 135 mg/dL — AB (ref 65–99)
GLUCOSE-CAPILLARY: 141 mg/dL — AB (ref 65–99)
GLUCOSE-CAPILLARY: 142 mg/dL — AB (ref 65–99)

## 2015-08-22 LAB — CBC WITH DIFFERENTIAL/PLATELET
BASOS ABS: 0 10*3/uL (ref 0.0–0.1)
Basophils Relative: 0 %
EOS PCT: 2 %
Eosinophils Absolute: 0.5 10*3/uL (ref 0.0–0.7)
HEMATOCRIT: 32.1 % — AB (ref 36.0–46.0)
HEMOGLOBIN: 11.2 g/dL — AB (ref 12.0–15.0)
LYMPHS PCT: 15 %
Lymphs Abs: 3.4 10*3/uL (ref 0.7–4.0)
MCH: 27.7 pg (ref 26.0–34.0)
MCHC: 34.9 g/dL (ref 30.0–36.0)
MCV: 79.3 fL (ref 78.0–100.0)
MONOS PCT: 8 %
Monocytes Absolute: 1.8 10*3/uL — ABNORMAL HIGH (ref 0.1–1.0)
NEUTROS ABS: 16.8 10*3/uL — AB (ref 1.7–7.7)
NEUTROS PCT: 75 %
Platelets: 223 10*3/uL (ref 150–400)
RBC: 4.05 MIL/uL (ref 3.87–5.11)
RDW: 18.2 % — ABNORMAL HIGH (ref 11.5–15.5)
WBC: 22.5 10*3/uL — AB (ref 4.0–10.5)

## 2015-08-22 LAB — CULTURE, BLOOD (ROUTINE X 2)

## 2015-08-22 LAB — BASIC METABOLIC PANEL
Anion gap: 5 (ref 5–15)
BUN: 41 mg/dL — ABNORMAL HIGH (ref 6–20)
CHLORIDE: 116 mmol/L — AB (ref 101–111)
CO2: 15 mmol/L — AB (ref 22–32)
CREATININE: 1.03 mg/dL — AB (ref 0.44–1.00)
Calcium: 7.4 mg/dL — ABNORMAL LOW (ref 8.9–10.3)
GFR calc non Af Amer: 51 mL/min — ABNORMAL LOW (ref >60)
GFR, EST AFRICAN AMERICAN: 59 mL/min — AB (ref >60)
Glucose, Bld: 135 mg/dL — ABNORMAL HIGH (ref 65–99)
POTASSIUM: 4 mmol/L (ref 3.5–5.1)
Sodium: 136 mmol/L (ref 135–145)

## 2015-08-22 LAB — PHOSPHORUS: PHOSPHORUS: 2.6 mg/dL (ref 2.5–4.6)

## 2015-08-22 LAB — MAGNESIUM: MAGNESIUM: 1.8 mg/dL (ref 1.7–2.4)

## 2015-08-22 MED ORDER — LEVOFLOXACIN IN D5W 750 MG/150ML IV SOLN
750.0000 mg | INTRAVENOUS | Status: DC
  Administered 2015-08-22 – 2015-08-24 (×3): 750 mg via INTRAVENOUS
  Filled 2015-08-22 (×3): qty 150

## 2015-08-22 NOTE — Progress Notes (Signed)
Pharmacy Antibiotic Note  [name] is a 80 y.o. female admitted on 08/18/2015 with sepsis.  Cx data is negative, but CXR cannot exclude PNA.  Antibiotics are being narrowed to Levaquin per pharmacy.   08/22/2015:  Renal function improved, NCrCl ~ 63ml/min  WBC elevated  Plan:  Levaquin 750mg  IV q24h  Follow up renal fxn, culture results, and clinical course.  Height: 5\' 5"  (165.1 cm) Weight: 242 lb 1 oz (109.8 kg) IBW/kg (Calculated) : 57  Temp (24hrs), Avg:98.3 F (36.8 C), Min:97.6 F (36.4 C), Max:99.3 F (37.4 C)   Recent Labs Lab 08/18/15 2015  08/18/15 2221 08/19/15 0128 08/19/15 0214 08/19/15 0815 08/19/15 1458 08/19/15 1543 08/20/15 1830 08/21/15 0500 08/22/15 0458  WBC 26.2*  --   --   --  17.8*  --   --   --   --  22.2* 22.5*  CREATININE 3.42*  --   --   --  3.02*  --   --  2.08* 1.49* 1.37* 1.03*  LATICACIDVEN  --   < > 3.04* 2.06* 2.2* 2.0* 1.9  --   --   --   --   < > = values in this interval not displayed.  Estimated Creatinine Clearance: 55.5 mL/min (by C-G formula based on Cr of 1.03).    No Known Allergies  Antimicrobials this admission: 7/3 Zosyn >> 7/6 7/3 Vancomycin >>7/7 7/7 Levaquin>>  Dose adjustments this admission: 7/5 SCr improved 7/4 pm so Zosyn dose adjusted.  Unable to get labs today, so conservatively, left Vanc dosing as initially ordered. 7/6 SCr improved, vanc adjusted from 1g q48h to 1250 mg q24h  Microbiology results: 7/3 BCx: 1/2 Staph species, reincubating.  BCID: Staph species (Oxacillin sens CoNS, contaminant?  Dr. Rosita Fire to evaluate) 7/3 UCx: NGF  7/4 MRSA PCR: neg  Thank you for allowing pharmacy to be a part of this patient's care.  Netta Cedars, PharmD, BCPS Pager: [phone] 08/22/2015 6:08 PM

## 2015-08-22 NOTE — Progress Notes (Signed)
PROGRESS NOTE    [name]  [id number] DOB: [birth year] DOA: 08/18/2015 PCP:  Melinda Crutch, MD    Brief Narrative: 80 year old with history of OSA on nocturnal O2, breast cancer, large cell non-Hodgkin's lymphoma status post chemotherapy followed by Dr. and ever who presents to the emergency department with cold type symptoms and decreased by mouth intake. Patient was found to have a low-grade temperature with white blood count of 26.2. Lactic acid and creatinine were elevated mildly, patient was also hypotensive. Patient was admitted to the critical care service. Ultimately, patient's hemodynamics improved and patient was simply transferred to the hospital service on 08/22/2015.   Assessment & Plan:   Active Problems:   Essential hypertension   Insomnia with sleep apnea   History of B-cell lymphoma   Hypotension   Arterial hypotension   Sepsis (HCC)   Hypotension - Blood pressure improved - Noted to be responsive to IV fluids - Unclear etiology - Patient tolerating by mouth intake well. - Will hold aggressive IV fluids for the time being. Continue monitor closely.  Elevated lactic acid - Improved  Acute kidney injury - Renal function improved since admission - Continue monitor renal function  Hyponatremia - Sodium normalized - Continue to follow  Stool incontinence - Abdominal imaging reviewed. Patient with stool noted (see below)  Leukocytosis - Stable at present - Patient had been on broad-spectrum antibiotics. - Blood cultures notable only for 1 out of 2 coag negative staph - Repeat chest x-ray this morning with findings that cannot exclude a possible mild right upper lobe infiltrate.  History of breast cancer/non-Hodgkin's lymphoma - Will notify agents oncologist of patient's admission  Possible SIRS - Unclear etiology at present.  Constipation - MiraLAX ordered. Would continue for now. - If no significant stool output, would give more aggressive  cathartic.   DVT prophylaxis: Heparin subcutaneous Code Status: Full Family Communication: Patient in room, family not at bedside Disposition Plan: Uncertain at this time   Consultants:   Critical care   Procedures:   Antimicrobials:  Anti-infectives    Start     Dose/Rate Route Frequency Ordered Stop   08/21/15 2200  vancomycin (VANCOCIN) 1,250 mg in sodium chloride 0.9 % 250 mL IVPB     1,250 mg 166.7 mL/hr over 90 Minutes Intravenous Every 24 hours 08/21/15 0808     08/20/15 2100  vancomycin (VANCOCIN) IVPB 1000 mg/200 mL premix  Status:  Discontinued     1,000 mg 200 mL/hr over 60 Minutes Intravenous Every 48 hours 08/18/15 2114 08/21/15 0808   08/20/15 1800  piperacillin-tazobactam (ZOSYN) IVPB 3.375 g  Status:  Discontinued     3.375 g 12.5 mL/hr over 240 Minutes Intravenous Every 8 hours 08/20/15 1346 08/21/15 1201   08/19/15 0500  piperacillin-tazobactam (ZOSYN) IVPB 2.25 g  Status:  Discontinued     2.25 g 100 mL/hr over 30 Minutes Intravenous Every 8 hours 08/18/15 2116 08/20/15 1346   08/18/15 2115  piperacillin-tazobactam (ZOSYN) IVPB 2.25 g     2.25 g 100 mL/hr over 30 Minutes Intravenous STAT 08/18/15 2112 08/18/15 2252   08/18/15 2100  piperacillin-tazobactam (ZOSYN) IVPB 3.375 g  Status:  Discontinued     3.375 g 100 mL/hr over 30 Minutes Intravenous  Once 08/18/15 2051 08/18/15 2112   08/18/15 2100  vancomycin (VANCOCIN) IVPB 1000 mg/200 mL premix     1,000 mg 200 mL/hr over 60 Minutes Intravenous  Once 08/18/15 2051 08/18/15 2304  Subjective: Patient without complaints morning.  Objective: Filed Vitals:   08/22/15 0900 08/22/15 1000 08/22/15 1100 08/22/15 1200  BP: 94/51 111/40 116/60 126/43  Pulse: 96 95 100 97  Temp:    97.7 F (36.5 C)  TempSrc:    Oral  Resp: 27 25 16 26   Height:      Weight:      SpO2: 92% 93% 88% 91%    Intake/Output Summary (Last 24 hours) at 08/22/15 1525 Last data filed at 08/22/15 1200  Gross per  24 hour  Intake   2750 ml  Output    950 ml  Net   1800 ml   Filed Weights   08/20/15 0500 08/21/15 0500 08/22/15 0453  Weight: 103.5 kg (228 lb 2.8 oz) 103.1 kg (227 lb 4.7 oz) 109.8 kg (242 lb 1 oz)    Examination:  General exam: Appears calm and comfortable  Respiratory system: Clear to auscultation. Respiratory effort normal. Cardiovascular system: S1 & S2 heard, RRR Gastrointestinal system: Abdomen Obese is nondistended, soft and nontender. No organomegaly or masses felt. Normal bowel sounds heard. Central nervous system: Alert and oriented. No focal neurological deficits. Extremities: Symmetric 5 x 5 power. Skin: No rashes, lesions or ulcers Psychiatry: Judgement and insight appear normal. Mood & affect appropriate.    Data Reviewed: I have personally reviewed following labs and imaging studies  CBC:  Recent Labs Lab 08/18/15 2015 08/19/15 0214 08/21/15 0500 08/22/15 0458  WBC 26.2* 17.8* 22.2* 22.5*  NEUTROABS 23.9*  --  18.3* 16.8*  HGB 14.5 12.3 12.0 11.2*  HCT 41.9 36.4 35.4* 32.1*  MCV 80.0 82.0 81.2 79.3  PLT 185 173 229 [id number]   Basic Metabolic Panel:  Recent Labs Lab 08/19/15 0214 08/19/15 1543 08/20/15 1830 08/21/15 0500 08/22/15 0458  NA 133* 132* 133* 133* 136  K 4.1 6.4* 4.0 4.4 4.0  CL 105 111 113* 114* 116*  CO2 14* 11* 14* 15* 15*  GLUCOSE 166* 186* 158* 155* 135*  BUN 75* 66* 44* 43* 41*  CREATININE 3.02* 2.08* 1.49* 1.37* 1.03*  CALCIUM 7.1* 6.7* 7.1* 7.0* 7.4*  MG 2.0  --   --  1.4* 1.8  PHOS 4.6  --   --  3.0 2.6   GFR: Estimated Creatinine Clearance: 55.5 mL/min (by C-G formula based on Cr of 1.03). Liver Function Tests:  Recent Labs Lab 08/18/15 2015  AST 115*  ALT 83*  ALKPHOS 169*  BILITOT 1.1  PROT 6.5  ALBUMIN 3.2*   No results for input(s): LIPASE, AMYLASE in the last 168 hours. No results for input(s): AMMONIA in the last 168 hours. Coagulation Profile:  Recent Labs Lab 08/18/15 2218 08/19/15 0214  INR 1.25  1.37   Cardiac Enzymes:  Recent Labs Lab 08/19/15 0214 08/20/15 1500  TROPONINI 0.04* 0.03*   BNP (last 3 results) No results for input(s): PROBNP in the last 8760 hours. HbA1C: No results for input(s): HGBA1C in the last 72 hours. CBG:  Recent Labs Lab 08/21/15 1729 08/21/15 2027 08/22/15 0007 08/22/15 0439 08/22/15 0756  GLUCAP 181* 144* 120* 112* 142*   Lipid Profile: No results for input(s): CHOL, HDL, LDLCALC, TRIG, CHOLHDL, LDLDIRECT in the last 72 hours. Thyroid Function Tests: No results for input(s): TSH, T4TOTAL, FREET4, T3FREE, THYROIDAB in the last 72 hours. Anemia Panel: No results for input(s): VITAMINB12, FOLATE, FERRITIN, TIBC, IRON, RETICCTPCT in the last 72 hours. Sepsis Labs:  Recent Labs Lab 08/19/15 0128 08/19/15 0214 08/19/15 0810 08/19/15 0815 08/19/15 1458 08/21/15  0500  PROCALCITON  --   --  0.76  --   --  0.23  LATICACIDVEN 2.06* 2.2*  --  2.0* 1.9  --     Recent Results (from the past 240 hour(s))  Blood Culture (routine x 2)     Status: Abnormal   Collection Time: 08/18/15  8:50 PM  Result Value Ref Range Status   Specimen Description BLOOD LEFT ANTECUBITAL  Final   Special Requests BOTTLES DRAWN AEROBIC AND ANAEROBIC 5 ML  Final   Culture  Setup Time   Final    GRAM POSITIVE COCCI IN CLUSTERS AEROBIC BOTTLE ONLY Organism ID to follow CRITICAL RESULT CALLED TO, READ BACK BY AND VERIFIED WITH: B GREEN PHARMD [id number] 08/19/15 A BROWNING    Culture (A)  Final    STAPHYLOCOCCUS SPECIES (COAGULASE NEGATIVE) THE SIGNIFICANCE OF ISOLATING THIS ORGANISM FROM A SINGLE SET OF BLOOD CULTURES WHEN MULTIPLE SETS ARE DRAWN IS UNCERTAIN. PLEASE NOTIFY THE MICROBIOLOGY DEPARTMENT WITHIN ONE WEEK IF SPECIATION AND SENSITIVITIES ARE REQUIRED. Performed at [facility]    Report Status 08/22/2015 FINAL  Final  Blood Culture ID Panel (Reflexed)     Status: Abnormal   Collection Time: 08/18/15  8:50 PM  Result Value Ref Range Status    Enterococcus species NOT DETECTED NOT DETECTED Final   Vancomycin resistance NOT DETECTED NOT DETECTED Final   Listeria monocytogenes NOT DETECTED NOT DETECTED Final   Staphylococcus species DETECTED (A) NOT DETECTED Final    Comment: CRITICAL RESULT CALLED TO, READ BACK BY AND VERIFIED WITH: B GREEN PHARMD 2258 08/19/15 A BROWNING    Staphylococcus aureus NOT DETECTED NOT DETECTED Final   Methicillin resistance NOT DETECTED NOT DETECTED Final   Streptococcus species NOT DETECTED NOT DETECTED Final   Streptococcus agalactiae NOT DETECTED NOT DETECTED Final   Streptococcus pneumoniae NOT DETECTED NOT DETECTED Final   Streptococcus pyogenes NOT DETECTED NOT DETECTED Final   Acinetobacter baumannii NOT DETECTED NOT DETECTED Final   Enterobacteriaceae species NOT DETECTED NOT DETECTED Final   Enterobacter cloacae complex NOT DETECTED NOT DETECTED Final   Escherichia coli NOT DETECTED NOT DETECTED Final   Klebsiella oxytoca NOT DETECTED NOT DETECTED Final   Klebsiella pneumoniae NOT DETECTED NOT DETECTED Final   Proteus species NOT DETECTED NOT DETECTED Final   Serratia marcescens NOT DETECTED NOT DETECTED Final   Carbapenem resistance NOT DETECTED NOT DETECTED Final   Haemophilus influenzae NOT DETECTED NOT DETECTED Final   Neisseria meningitidis NOT DETECTED NOT DETECTED Final   Pseudomonas aeruginosa NOT DETECTED NOT DETECTED Final   Candida albicans NOT DETECTED NOT DETECTED Final   Candida glabrata NOT DETECTED NOT DETECTED Final   Candida krusei NOT DETECTED NOT DETECTED Final   Candida parapsilosis NOT DETECTED NOT DETECTED Final   Candida tropicalis NOT DETECTED NOT DETECTED Final    Comment: Performed at [facility]  Urine culture     Status: None   Collection Time: 08/18/15 10:04 PM  Result Value Ref Range Status   Specimen Description URINE, CATHETERIZED  Final   Special Requests NONE  Final   Culture NO GROWTH Performed at [facility]   Final   Report  Status 08/20/2015 FINAL  Final  Blood Culture (routine x 2)     Status: None (Preliminary result)   Collection Time: 08/18/15 10:18 PM  Result Value Ref Range Status   Specimen Description BLOOD LEFT HAND  Final   Special Requests BOTTLES DRAWN AEROBIC AND ANAEROBIC 5CC  Final  Culture   Final    NO GROWTH 2 DAYS Performed at [facility]    Report Status PENDING  Incomplete  MRSA PCR Screening     Status: None   Collection Time: 08/19/15  2:05 AM  Result Value Ref Range Status   MRSA by PCR NEGATIVE NEGATIVE Final    Comment:        The GeneXpert MRSA Assay (FDA approved for NASAL specimens only), is one component of a comprehensive MRSA colonization surveillance program. It is not intended to diagnose MRSA infection nor to guide or monitor treatment for MRSA infections.          Radiology Studies: Dg Chest Port 1 View  08/22/2015  CLINICAL DATA:  Shortness of breath.  Wheezing. EXAM: PORTABLE CHEST 1 VIEW COMPARISON:  08/18/2015, 08/11/2015.  PET-CT 06/30/2015 FINDINGS: Mediastinum and hilar structures normal. Stable cardiomegaly. Mild right upper lobe infiltrate cannot be excluded. Low lung volumes with mild bibasilar atelectasis. No prominent pleural effusion. No pneumothorax. Degenerative changes thoracic spine IMPRESSION: 1.  Mild right upper lobe infiltrate cannot be excluded. 2.  Mild bibasilar atelectasis and/or infiltrates. 3. Stable cardiomegaly . Electronically Signed   By: Marcello Moores  Register   On: 08/22/2015 09:27        Scheduled Meds: . amitriptyline  100 mg Oral QHS  . aspirin EC  81 mg Oral q morning - 10a  . atorvastatin  80 mg Oral Daily  . heparin  5,000 Units Subcutaneous Q8H  . insulin aspart  1-3 Units Subcutaneous Q4H  . LORazepam  0.5 mg Oral BID  . pantoprazole  40 mg Oral Daily  . senna-docusate  2 tablet Oral BID  . vancomycin  1,250 mg Intravenous Q24H   Continuous Infusions: . sodium chloride 125 mL/hr at 08/21/15 2200      LOS: 3 days     Finlee Milo, Orpah Melter, MD [facility] Pager: [phone]  If 7PM-7AM, please contact night-coverage [email or web address] Password [email or web address] 08/22/2015, 3:25 PM

## 2015-08-22 NOTE — Progress Notes (Signed)
Date:  August 22, 2015 Chart reviewed for concurrent status and case management needs. Will continue to follow the patient for changes and needs:  A-line removed this am/bp holding/02 at 6 liter per [place]/does require venti mask intermittently.  Velva Harman, BSN, [place], [place]

## 2015-08-23 LAB — GLUCOSE, CAPILLARY
GLUCOSE-CAPILLARY: 114 mg/dL — AB (ref 65–99)
GLUCOSE-CAPILLARY: 121 mg/dL — AB (ref 65–99)
GLUCOSE-CAPILLARY: 131 mg/dL — AB (ref 65–99)
Glucose-Capillary: 170 mg/dL — ABNORMAL HIGH (ref 65–99)
Glucose-Capillary: 118 mg/dL — ABNORMAL HIGH (ref 65–99)
Glucose-Capillary: 188 mg/dL — ABNORMAL HIGH (ref 65–99)

## 2015-08-23 LAB — CBC WITH DIFFERENTIAL/PLATELET
BASOS ABS: 0.1 10*3/uL (ref 0.0–0.1)
Basophils Relative: 0 %
Eosinophils Absolute: 0.5 10*3/uL (ref 0.0–0.7)
Eosinophils Relative: 3 %
HEMATOCRIT: 31.5 % — AB (ref 36.0–46.0)
Hemoglobin: 10.5 g/dL — ABNORMAL LOW (ref 12.0–15.0)
LYMPHS ABS: 2.8 10*3/uL (ref 0.7–4.0)
LYMPHS PCT: 15 %
MCH: 26.9 pg (ref 26.0–34.0)
MCHC: 33.3 g/dL (ref 30.0–36.0)
MCV: 80.8 fL (ref 78.0–100.0)
MONO ABS: 1.4 10*3/uL — AB (ref 0.1–1.0)
Monocytes Relative: 7 %
NEUTROS ABS: 14.3 10*3/uL — AB (ref 1.7–7.7)
Neutrophils Relative %: 75 %
Platelets: 216 10*3/uL (ref 150–400)
RBC: 3.9 MIL/uL (ref 3.87–5.11)
RDW: 18.6 % — ABNORMAL HIGH (ref 11.5–15.5)
WBC: 19 10*3/uL — AB (ref 4.0–10.5)

## 2015-08-23 LAB — BASIC METABOLIC PANEL
ANION GAP: 4 — AB (ref 5–15)
BUN: 34 mg/dL — ABNORMAL HIGH (ref 6–20)
CO2: 17 mmol/L — AB (ref 22–32)
Calcium: 7.6 mg/dL — ABNORMAL LOW (ref 8.9–10.3)
Chloride: 116 mmol/L — ABNORMAL HIGH (ref 101–111)
Creatinine, Ser: 0.82 mg/dL (ref 0.44–1.00)
GFR calc Af Amer: >60 mL/min (ref >60)
GFR calc non Af Amer: >60 mL/min (ref >60)
GLUCOSE: 120 mg/dL — AB (ref 65–99)
POTASSIUM: 4.2 mmol/L (ref 3.5–5.1)
Sodium: 137 mmol/L (ref 135–145)

## 2015-08-23 LAB — PHOSPHORUS: Phosphorus: 2.5 mg/dL (ref 2.5–4.6)

## 2015-08-23 LAB — MAGNESIUM: Magnesium: 1.6 mg/dL — ABNORMAL LOW (ref 1.7–2.4)

## 2015-08-23 MED ORDER — FUROSEMIDE 10 MG/ML IJ SOLN
40.0000 mg | Freq: Once | INTRAMUSCULAR | Status: AC
Start: 2015-08-23 — End: 2015-08-23
  Administered 2015-08-23: 40 mg via INTRAVENOUS
  Filled 2015-08-23: qty 4

## 2015-08-23 MED ORDER — MAGNESIUM SULFATE 2 GM/50ML IV SOLN
2.0000 g | Freq: Once | INTRAVENOUS | Status: AC
  Administered 2015-08-23: 2 g via INTRAVENOUS
  Filled 2015-08-23: qty 50

## 2015-08-23 NOTE — Progress Notes (Signed)
CSW will continue to follow patient for continued support and assist with discharge once medically stable.

## 2015-08-23 NOTE — Progress Notes (Signed)
PROGRESS NOTE    Legend Carbonaro Oldfield  [id number] DOB: 1935/03/21 DOA: 08/18/2015 PCP:  Melinda Crutch, MD    Brief Narrative: 80 year old with history of OSA on nocturnal O2, breast cancer, large cell non-Hodgkin's lymphoma status post chemotherapy followed by Dr. and ever who presents to the emergency department with cold type symptoms and decreased by mouth intake. Patient was found to have a low-grade temperature with white blood count of 26.2. Lactic acid and creatinine were elevated mildly, patient was also hypotensive. Patient was admitted to the critical care service. Ultimately, patient's hemodynamics improved and patient was simply transferred to the hospital service on 08/22/2015.   Assessment & Plan:   Active Problems:   Essential hypertension   Insomnia with sleep apnea   History of B-cell lymphoma   Hypotension   Arterial hypotension   Sepsis (HCC)  Hypotension - Blood pressure improved and noted to be responsive to IV fluids - Unclear etiology - Patient tolerating by mouth intake well. - Holding aggressive IV fluids for the time being. Continue monitor closely.  Elevated lactic acid - Improved with IVF  Acute kidney injury - Renal function improved since admission - Continue monitor renal function  Hyponatremia - Sodium normalized - Continue to follow  Stool incontinence - Abdominal imaging reviewed. Patient with stool noted (see below)  Leukocytosis - Patient had been on broad-spectrum antibiotics. - Blood cultures notable only for 1 out of 2 coag negative staph - Repeat chest x-ray this morning with findings that cannot exclude a possible mild right upper lobe infiltrate. - transitioned patient to Levaquin for possible HCAP. - WBC now improving  History of breast cancer/non-Hodgkin's lymphoma - Will notify agents oncologist of patient's admission  Possible sepsis with pneumonia, present on admit - CXR with findings suggestive of pna - On empiric  levaquin  Constipation - MiraLAX ordered. Would continue for now. - If no significant stool output, would give more aggressive cathartic.   DVT prophylaxis: Heparin subcutaneous Code Status: Full Family Communication: Patient in room, family not at bedside Disposition Plan: Uncertain at this time   Consultants:   Critical care   Procedures:   Antimicrobials:  Anti-infectives    Start     Dose/Rate Route Frequency Ordered Stop   08/22/15 2000  levofloxacin (LEVAQUIN) IVPB 750 mg     750 mg 100 mL/hr over 90 Minutes Intravenous Every 24 hours 08/22/15 1808     08/21/15 2200  vancomycin (VANCOCIN) 1,250 mg in sodium chloride 0.9 % 250 mL IVPB  Status:  Discontinued     1,250 mg 166.7 mL/hr over 90 Minutes Intravenous Every 24 hours 08/21/15 0808 08/22/15 1758   08/20/15 2100  vancomycin (VANCOCIN) IVPB 1000 mg/200 mL premix  Status:  Discontinued     1,000 mg 200 mL/hr over 60 Minutes Intravenous Every 48 hours 08/18/15 2114 08/21/15 0808   08/20/15 1800  piperacillin-tazobactam (ZOSYN) IVPB 3.375 g  Status:  Discontinued     3.375 g 12.5 mL/hr over 240 Minutes Intravenous Every 8 hours 08/20/15 1346 08/21/15 1201   08/19/15 0500  piperacillin-tazobactam (ZOSYN) IVPB 2.25 g  Status:  Discontinued     2.25 g 100 mL/hr over 30 Minutes Intravenous Every 8 hours 08/18/15 2116 08/20/15 1346   08/18/15 2115  piperacillin-tazobactam (ZOSYN) IVPB 2.25 g     2.25 g 100 mL/hr over 30 Minutes Intravenous STAT 08/18/15 2112 08/18/15 2252   08/18/15 2100  piperacillin-tazobactam (ZOSYN) IVPB 3.375 g  Status:  Discontinued     3.375  g 100 mL/hr over 30 Minutes Intravenous  Once 08/18/15 2051 08/18/15 2112   08/18/15 2100  vancomycin (VANCOCIN) IVPB 1000 mg/200 mL premix     1,000 mg 200 mL/hr over 60 Minutes Intravenous  Once 08/18/15 2051 08/18/15 2304          Subjective: Reports breathing better and feeling better this AM.  Objective: Filed Vitals:   08/23/15 0900 08/23/15  1000 08/23/15 1100 08/23/15 1154  BP:  127/42    Pulse: 105 99 99   Temp:    98.2 F (36.8 C)  TempSrc:    Oral  Resp: 27 25 27    Height:      Weight:      SpO2: 91% 93% 90%     Intake/Output Summary (Last 24 hours) at 08/23/15 1414 Last data filed at 08/23/15 1300  Gross per 24 hour  Intake    365 ml  Output    350 ml  Net     15 ml   Filed Weights   08/21/15 0500 08/22/15 0453 08/23/15 0500  Weight: 103.1 kg (227 lb 4.7 oz) 109.8 kg (242 lb 1 oz) 110.3 kg (243 lb 2.7 oz)    Examination:  General exam: Appears calm and comfortable, laying in bed  Respiratory system: Clear to auscultation. Respiratory effort normal. Cardiovascular system: S1 & S2 heard, RRR Gastrointestinal system: Abdomen Obese is nondistended, soft and nontender. No organomegaly or masses felt. Normal bowel sounds heard. Central nervous system: Alert and oriented. No focal neurological deficits. Extremities: Symmetric 5 x 5 power. Skin: No rashes, lesions or ulcers Psychiatry: Judgement and insight appear normal. Mood & affect appropriate.    Data Reviewed: I have personally reviewed following labs and imaging studies  CBC:  Recent Labs Lab 08/18/15 2015 08/19/15 0214 08/21/15 0500 08/22/15 0458 08/23/15 0528  WBC 26.2* 17.8* 22.2* 22.5* 19.0*  NEUTROABS 23.9*  --  18.3* 16.8* 14.3*  HGB 14.5 12.3 12.0 11.2* 10.5*  HCT 41.9 36.4 35.4* 32.1* 31.5*  MCV 80.0 82.0 81.2 79.3 80.8  PLT 185 173 229 223 [id number]   Basic Metabolic Panel:  Recent Labs Lab 08/19/15 0214 08/19/15 1543 08/20/15 1830 08/21/15 0500 08/22/15 0458 08/23/15 0528  NA 133* 132* 133* 133* 136 137  K 4.1 6.4* 4.0 4.4 4.0 4.2  CL 105 111 113* 114* 116* 116*  CO2 14* 11* 14* 15* 15* 17*  GLUCOSE 166* 186* 158* 155* 135* 120*  BUN 75* 66* 44* 43* 41* 34*  CREATININE 3.02* 2.08* 1.49* 1.37* 1.03* 0.82  CALCIUM 7.1* 6.7* 7.1* 7.0* 7.4* 7.6*  MG 2.0  --   --  1.4* 1.8 1.6*  PHOS 4.6  --   --  3.0 2.6 2.5   GFR: Estimated  Creatinine Clearance: 69.9 mL/min (by C-G formula based on Cr of 0.82). Liver Function Tests:  Recent Labs Lab 08/18/15 2015  AST 115*  ALT 83*  ALKPHOS 169*  BILITOT 1.1  PROT 6.5  ALBUMIN 3.2*   No results for input(s): LIPASE, AMYLASE in the last 168 hours. No results for input(s): AMMONIA in the last 168 hours. Coagulation Profile:  Recent Labs Lab 08/18/15 2218 08/19/15 0214  INR 1.25 1.37   Cardiac Enzymes:  Recent Labs Lab 08/19/15 0214 08/20/15 1500  TROPONINI 0.04* 0.03*   BNP (last 3 results) No results for input(s): PROBNP in the last 8760 hours. HbA1C: No results for input(s): HGBA1C in the last 72 hours. CBG:  Recent Labs Lab 08/22/15 1959 08/23/15 0011 08/23/15  0445 08/23/15 0744 08/23/15 1118  GLUCAP 135* 121* 114* 131* 170*   Lipid Profile: No results for input(s): CHOL, HDL, LDLCALC, TRIG, CHOLHDL, LDLDIRECT in the last 72 hours. Thyroid Function Tests: No results for input(s): TSH, T4TOTAL, FREET4, T3FREE, THYROIDAB in the last 72 hours. Anemia Panel: No results for input(s): VITAMINB12, FOLATE, FERRITIN, TIBC, IRON, RETICCTPCT in the last 72 hours. Sepsis Labs:  Recent Labs Lab 08/19/15 0128 08/19/15 0214 08/19/15 0810 08/19/15 0815 08/19/15 1458 08/21/15 0500  PROCALCITON  --   --  0.76  --   --  0.23  LATICACIDVEN 2.06* 2.2*  --  2.0* 1.9  --     Recent Results (from the past 240 hour(s))  Blood Culture (routine x 2)     Status: Abnormal   Collection Time: 08/18/15  8:50 PM  Result Value Ref Range Status   Specimen Description BLOOD LEFT ANTECUBITAL  Final   Special Requests BOTTLES DRAWN AEROBIC AND ANAEROBIC 5 ML  Final   Culture  Setup Time   Final    GRAM POSITIVE COCCI IN CLUSTERS AEROBIC BOTTLE ONLY Organism ID to follow CRITICAL RESULT CALLED TO, READ BACK BY AND VERIFIED WITH: B GREEN PHARMD [id number] 08/19/15 A BROWNING    Culture (A)  Final    STAPHYLOCOCCUS SPECIES (COAGULASE NEGATIVE) THE SIGNIFICANCE OF  ISOLATING THIS ORGANISM FROM A SINGLE SET OF BLOOD CULTURES WHEN MULTIPLE SETS ARE DRAWN IS UNCERTAIN. PLEASE NOTIFY THE MICROBIOLOGY DEPARTMENT WITHIN ONE WEEK IF SPECIATION AND SENSITIVITIES ARE REQUIRED. Performed at [facility]    Report Status 08/22/2015 FINAL  Final  Blood Culture ID Panel (Reflexed)     Status: Abnormal   Collection Time: 08/18/15  8:50 PM  Result Value Ref Range Status   Enterococcus species NOT DETECTED NOT DETECTED Final   Vancomycin resistance NOT DETECTED NOT DETECTED Final   Listeria monocytogenes NOT DETECTED NOT DETECTED Final   Staphylococcus species DETECTED (A) NOT DETECTED Final    Comment: CRITICAL RESULT CALLED TO, READ BACK BY AND VERIFIED WITH: B GREEN PHARMD 2258 08/19/15 A BROWNING    Staphylococcus aureus NOT DETECTED NOT DETECTED Final   Methicillin resistance NOT DETECTED NOT DETECTED Final   Streptococcus species NOT DETECTED NOT DETECTED Final   Streptococcus agalactiae NOT DETECTED NOT DETECTED Final   Streptococcus pneumoniae NOT DETECTED NOT DETECTED Final   Streptococcus pyogenes NOT DETECTED NOT DETECTED Final   Acinetobacter baumannii NOT DETECTED NOT DETECTED Final   Enterobacteriaceae species NOT DETECTED NOT DETECTED Final   Enterobacter cloacae complex NOT DETECTED NOT DETECTED Final   Escherichia coli NOT DETECTED NOT DETECTED Final   Klebsiella oxytoca NOT DETECTED NOT DETECTED Final   Klebsiella pneumoniae NOT DETECTED NOT DETECTED Final   Proteus species NOT DETECTED NOT DETECTED Final   Serratia marcescens NOT DETECTED NOT DETECTED Final   Carbapenem resistance NOT DETECTED NOT DETECTED Final   Haemophilus influenzae NOT DETECTED NOT DETECTED Final   Neisseria meningitidis NOT DETECTED NOT DETECTED Final   Pseudomonas aeruginosa NOT DETECTED NOT DETECTED Final   Candida albicans NOT DETECTED NOT DETECTED Final   Candida glabrata NOT DETECTED NOT DETECTED Final   Candida krusei NOT DETECTED NOT DETECTED Final    Candida parapsilosis NOT DETECTED NOT DETECTED Final   Candida tropicalis NOT DETECTED NOT DETECTED Final    Comment: Performed at [facility]  Urine culture     Status: None   Collection Time: 08/18/15 10:04 PM  Result Value Ref Range Status   Specimen  Description URINE, CATHETERIZED  Final   Special Requests NONE  Final   Culture NO GROWTH Performed at [facility]   Final   Report Status 08/20/2015 FINAL  Final  Blood Culture (routine x 2)     Status: None (Preliminary result)   Collection Time: 08/18/15 10:18 PM  Result Value Ref Range Status   Specimen Description BLOOD LEFT HAND  Final   Special Requests BOTTLES DRAWN AEROBIC AND ANAEROBIC 5CC  Final   Culture   Final    NO GROWTH 4 DAYS Performed at [facility]    Report Status PENDING  Incomplete  MRSA PCR Screening     Status: None   Collection Time: 08/19/15  2:05 AM  Result Value Ref Range Status   MRSA by PCR NEGATIVE NEGATIVE Final    Comment:        The GeneXpert MRSA Assay (FDA approved for NASAL specimens only), is one component of a comprehensive MRSA colonization surveillance program. It is not intended to diagnose MRSA infection nor to guide or monitor treatment for MRSA infections.          Radiology Studies: Dg Chest Port 1 View  08/22/2015  CLINICAL DATA:  Shortness of breath.  Wheezing. EXAM: PORTABLE CHEST 1 VIEW COMPARISON:  08/18/2015, 08/11/2015.  PET-CT 06/30/2015 FINDINGS: Mediastinum and hilar structures normal. Stable cardiomegaly. Mild right upper lobe infiltrate cannot be excluded. Low lung volumes with mild bibasilar atelectasis. No prominent pleural effusion. No pneumothorax. Degenerative changes thoracic spine IMPRESSION: 1.  Mild right upper lobe infiltrate cannot be excluded. 2.  Mild bibasilar atelectasis and/or infiltrates. 3. Stable cardiomegaly . Electronically Signed   By: Marcello Moores  Register   On: 08/22/2015 09:27        Scheduled Meds: . amitriptyline   100 mg Oral QHS  . aspirin EC  81 mg Oral q morning - 10a  . atorvastatin  80 mg Oral Daily  . heparin  5,000 Units Subcutaneous Q8H  . insulin aspart  1-3 Units Subcutaneous Q4H  . levofloxacin (LEVAQUIN) IV  750 mg Intravenous Q24H  . LORazepam  0.5 mg Oral BID  . pantoprazole  40 mg Oral Daily  . senna-docusate  2 tablet Oral BID   Continuous Infusions:     LOS: 4 days     Jovani Flury, Orpah Melter, MD [facility] Pager: [phone]  If 7PM-7AM, please contact night-coverage [email or web address] Password [email or web address] 08/23/2015, 2:14 PM

## 2015-08-24 LAB — CBC
HEMATOCRIT: 30.6 % — AB (ref 36.0–46.0)
HEMOGLOBIN: 10.7 g/dL — AB (ref 12.0–15.0)
MCH: 27.8 pg (ref 26.0–34.0)
MCHC: 35 g/dL (ref 30.0–36.0)
MCV: 79.5 fL (ref 78.0–100.0)
Platelets: 243 10*3/uL (ref 150–400)
RBC: 3.85 MIL/uL — ABNORMAL LOW (ref 3.87–5.11)
RDW: 18.6 % — AB (ref 11.5–15.5)
WBC: 19.7 10*3/uL — ABNORMAL HIGH (ref 4.0–10.5)

## 2015-08-24 LAB — CULTURE, BLOOD (ROUTINE X 2): CULTURE: NO GROWTH

## 2015-08-24 LAB — GLUCOSE, CAPILLARY
GLUCOSE-CAPILLARY: 129 mg/dL — AB (ref 65–99)
GLUCOSE-CAPILLARY: 113 mg/dL — AB (ref 65–99)
GLUCOSE-CAPILLARY: 138 mg/dL — AB (ref 65–99)
Glucose-Capillary: 117 mg/dL — ABNORMAL HIGH (ref 65–99)
Glucose-Capillary: 127 mg/dL — ABNORMAL HIGH (ref 65–99)
Glucose-Capillary: 129 mg/dL — ABNORMAL HIGH (ref 65–99)

## 2015-08-24 MED ORDER — FUROSEMIDE 10 MG/ML IJ SOLN
40.0000 mg | Freq: Two times a day (BID) | INTRAMUSCULAR | Status: DC
  Administered 2015-08-24 – 2015-08-26 (×5): 40 mg via INTRAVENOUS
  Filled 2015-08-24 (×5): qty 4

## 2015-08-24 NOTE — Progress Notes (Signed)
PROGRESS NOTE    [name]  [id number] DOB: July 23, 1935 DOA: 08/18/2015 PCP:  Melinda Crutch, MD    Brief Narrative: 80 year old with history of OSA on nocturnal O2, breast cancer, large cell non-Hodgkin's lymphoma status post chemotherapy followed by Dr. and ever who presents to the emergency department with cold type symptoms and decreased by mouth intake. Patient was found to have a low-grade temperature with white blood count of 26.2. Lactic acid and creatinine were elevated mildly, patient was also hypotensive. Patient was admitted to the critical care service. Ultimately, patient's hemodynamics improved and patient was simply transferred to the hospital service on 08/22/2015.   Assessment & Plan:   Active Problems:   Essential hypertension   Insomnia with sleep apnea   History of B-cell lymphoma   Hypotension   Arterial hypotension   Sepsis (HCC)  Hypotension - Blood pressure improved and noted to be responsive to IV fluids - Unclear etiology - Patient tolerating by mouth intake well. - BP presently normal off fluids  Elevated lactic acid - Improved with IVF  Acute kidney injury - Renal function improved since admission - Continue monitor renal function - Have started scheduled lasix to aide in diuresis - Will obtain 2d echo. Last EF from 2008 of 55-60%  Hyponatremia - Sodium normalized - Continue to follow  Stool incontinence - Abdominal imaging reviewed. Patient with stool noted (see below)  Leukocytosis - Patient had been on broad-spectrum antibiotics. - Blood cultures notable only for 1 out of 2 coag negative staph - Repeat chest x-ray this morning with findings that cannot exclude a possible mild right upper lobe infiltrate. - transitioned patient to Levaquin for possible HCAP. - WBC showed improvement  History of breast cancer/non-Hodgkin's lymphoma - Have alerted pt's oncologist of her admission  Possible sepsis with pneumonia, present on admit -  CXR with findings suggestive of pna - On empiric levaquin  Constipation - MiraLAX ordered. Would continue for now. - Per staff, good results recently   DVT prophylaxis: Heparin subcutaneous Code Status: Full Family Communication: Patient in room, family not at bedside Disposition Plan: Uncertain at this time   Consultants:   Critical care   Procedures:   Antimicrobials:  Anti-infectives    Start     Dose/Rate Route Frequency Ordered Stop   08/22/15 2000  levofloxacin (LEVAQUIN) IVPB 750 mg     750 mg 100 mL/hr over 90 Minutes Intravenous Every 24 hours 08/22/15 1808     08/21/15 2200  vancomycin (VANCOCIN) 1,250 mg in sodium chloride 0.9 % 250 mL IVPB  Status:  Discontinued     1,250 mg 166.7 mL/hr over 90 Minutes Intravenous Every 24 hours 08/21/15 0808 08/22/15 1758   08/20/15 2100  vancomycin (VANCOCIN) IVPB 1000 mg/200 mL premix  Status:  Discontinued     1,000 mg 200 mL/hr over 60 Minutes Intravenous Every 48 hours 08/18/15 2114 08/21/15 0808   08/20/15 1800  piperacillin-tazobactam (ZOSYN) IVPB 3.375 g  Status:  Discontinued     3.375 g 12.5 mL/hr over 240 Minutes Intravenous Every 8 hours 08/20/15 1346 08/21/15 1201   08/19/15 0500  piperacillin-tazobactam (ZOSYN) IVPB 2.25 g  Status:  Discontinued     2.25 g 100 mL/hr over 30 Minutes Intravenous Every 8 hours 08/18/15 2116 08/20/15 1346   08/18/15 2115  piperacillin-tazobactam (ZOSYN) IVPB 2.25 g     2.25 g 100 mL/hr over 30 Minutes Intravenous STAT 08/18/15 2112 08/18/15 2252   08/18/15 2100  piperacillin-tazobactam (ZOSYN) IVPB 3.375 g  Status:  Discontinued     3.375 g 100 mL/hr over 30 Minutes Intravenous  Once 08/18/15 2051 08/18/15 2112   08/18/15 2100  vancomycin (VANCOCIN) IVPB 1000 mg/200 mL premix     1,000 mg 200 mL/hr over 60 Minutes Intravenous  Once 08/18/15 2051 08/18/15 2304          Subjective: Requesting sleep med  Objective: Filed Vitals:   08/24/15 0805 08/24/15 1000 08/24/15 1212  08/24/15 1452  BP:  126/56 105/46 137/56  Pulse:  96 100 102  Temp: 97.9 F (36.6 C)  98.9 F (37.2 C) 98.1 F (36.7 C)  TempSrc: Oral  Oral Oral  Resp:  26 26 26   Height:      Weight:      SpO2:  96% 96% 95%    Intake/Output Summary (Last 24 hours) at 08/24/15 1457 Last data filed at 08/24/15 1216  Gross per 24 hour  Intake    450 ml  Output   2100 ml  Net  -1650 ml   Filed Weights   08/22/15 0453 08/23/15 0500 08/24/15 0500  Weight: 109.8 kg (242 lb 1 oz) 110.3 kg (243 lb 2.7 oz) 110 kg (242 lb 8.1 oz)    Examination:  General exam: Appears calm and comfortable, laying in bed on bedpan Respiratory system: Clear to auscultation. Respiratory effort normal. Cardiovascular system: S1 & S2 heard, RRR Gastrointestinal system: Abdomen Obese is nondistended, soft and nontender. No organomegaly or masses felt. Normal bowel sounds heard. Central nervous system: Alert and oriented. No focal neurological deficits. Extremities: Symmetric 5 x 5 power. Skin: No rashes, lesions Psychiatry: Judgement and insight appear normal. Mood & affect appropriate.    Data Reviewed: I have personally reviewed following labs and imaging studies  CBC:  Recent Labs Lab 08/18/15 2015 08/19/15 0214 08/21/15 0500 08/22/15 0458 08/23/15 0528 08/24/15 0343  WBC 26.2* 17.8* 22.2* 22.5* 19.0* 19.7*  NEUTROABS 23.9*  --  18.3* 16.8* 14.3*  --   HGB 14.5 12.3 12.0 11.2* 10.5* 10.7*  HCT 41.9 36.4 35.4* 32.1* 31.5* 30.6*  MCV 80.0 82.0 81.2 79.3 80.8 79.5  PLT 185 173 229 223 216 0000000   Basic Metabolic Panel:  Recent Labs Lab 08/19/15 0214 08/19/15 1543 08/20/15 1830 08/21/15 0500 08/22/15 0458 08/23/15 0528  NA 133* 132* 133* 133* 136 137  K 4.1 6.4* 4.0 4.4 4.0 4.2  CL 105 111 113* 114* 116* 116*  CO2 14* 11* 14* 15* 15* 17*  GLUCOSE 166* 186* 158* 155* 135* 120*  BUN 75* 66* 44* 43* 41* 34*  CREATININE 3.02* 2.08* 1.49* 1.37* 1.03* 0.82  CALCIUM 7.1* 6.7* 7.1* 7.0* 7.4* 7.6*  MG  2.0  --   --  1.4* 1.8 1.6*  PHOS 4.6  --   --  3.0 2.6 2.5   GFR: Estimated Creatinine Clearance: 69.8 mL/min (by C-G formula based on Cr of 0.82). Liver Function Tests:  Recent Labs Lab 08/18/15 2015  AST 115*  ALT 83*  ALKPHOS 169*  BILITOT 1.1  PROT 6.5  ALBUMIN 3.2*   No results for input(s): LIPASE, AMYLASE in the last 168 hours. No results for input(s): AMMONIA in the last 168 hours. Coagulation Profile:  Recent Labs Lab 08/18/15 2218 08/19/15 0214  INR 1.25 1.37   Cardiac Enzymes:  Recent Labs Lab 08/19/15 0214 08/20/15 1500  TROPONINI 0.04* 0.03*   BNP (last 3 results) No results for input(s): PROBNP in the last 8760 hours. HbA1C: No results for input(s): HGBA1C in  the last 72 hours. CBG:  Recent Labs Lab 08/23/15 1931 08/24/15 0004 08/24/15 0340 08/24/15 0735 08/24/15 1233  GLUCAP 188* 117* 129* 113* 138*   Lipid Profile: No results for input(s): CHOL, HDL, LDLCALC, TRIG, CHOLHDL, LDLDIRECT in the last 72 hours. Thyroid Function Tests: No results for input(s): TSH, T4TOTAL, FREET4, T3FREE, THYROIDAB in the last 72 hours. Anemia Panel: No results for input(s): VITAMINB12, FOLATE, FERRITIN, TIBC, IRON, RETICCTPCT in the last 72 hours. Sepsis Labs:  Recent Labs Lab 08/19/15 0128 08/19/15 0214 08/19/15 0810 08/19/15 0815 08/19/15 1458 08/21/15 0500  PROCALCITON  --   --  0.76  --   --  0.23  LATICACIDVEN 2.06* 2.2*  --  2.0* 1.9  --     Recent Results (from the past 240 hour(s))  Blood Culture (routine x 2)     Status: Abnormal   Collection Time: 08/18/15  8:50 PM  Result Value Ref Range Status   Specimen Description BLOOD LEFT ANTECUBITAL  Final   Special Requests BOTTLES DRAWN AEROBIC AND ANAEROBIC 5 ML  Final   Culture  Setup Time   Final    GRAM POSITIVE COCCI IN CLUSTERS AEROBIC BOTTLE ONLY Organism ID to follow CRITICAL RESULT CALLED TO, READ BACK BY AND VERIFIED WITH: B GREEN PHARMD 2258 08/19/15 A BROWNING    Culture (A)   Final    STAPHYLOCOCCUS SPECIES (COAGULASE NEGATIVE) THE SIGNIFICANCE OF ISOLATING THIS ORGANISM FROM A SINGLE SET OF BLOOD CULTURES WHEN MULTIPLE SETS ARE DRAWN IS UNCERTAIN. PLEASE NOTIFY THE MICROBIOLOGY DEPARTMENT WITHIN ONE WEEK IF SPECIATION AND SENSITIVITIES ARE REQUIRED. Performed at [facility]    Report Status 08/22/2015 FINAL  Final  Blood Culture ID Panel (Reflexed)     Status: Abnormal   Collection Time: 08/18/15  8:50 PM  Result Value Ref Range Status   Enterococcus species NOT DETECTED NOT DETECTED Final   Vancomycin resistance NOT DETECTED NOT DETECTED Final   Listeria monocytogenes NOT DETECTED NOT DETECTED Final   Staphylococcus species DETECTED (A) NOT DETECTED Final    Comment: CRITICAL RESULT CALLED TO, READ BACK BY AND VERIFIED WITH: B GREEN PHARMD 2258 08/19/15 A BROWNING    Staphylococcus aureus NOT DETECTED NOT DETECTED Final   Methicillin resistance NOT DETECTED NOT DETECTED Final   Streptococcus species NOT DETECTED NOT DETECTED Final   Streptococcus agalactiae NOT DETECTED NOT DETECTED Final   Streptococcus pneumoniae NOT DETECTED NOT DETECTED Final   Streptococcus pyogenes NOT DETECTED NOT DETECTED Final   Acinetobacter baumannii NOT DETECTED NOT DETECTED Final   Enterobacteriaceae species NOT DETECTED NOT DETECTED Final   Enterobacter cloacae complex NOT DETECTED NOT DETECTED Final   Escherichia coli NOT DETECTED NOT DETECTED Final   Klebsiella oxytoca NOT DETECTED NOT DETECTED Final   Klebsiella pneumoniae NOT DETECTED NOT DETECTED Final   Proteus species NOT DETECTED NOT DETECTED Final   Serratia marcescens NOT DETECTED NOT DETECTED Final   Carbapenem resistance NOT DETECTED NOT DETECTED Final   Haemophilus influenzae NOT DETECTED NOT DETECTED Final   Neisseria meningitidis NOT DETECTED NOT DETECTED Final   Pseudomonas aeruginosa NOT DETECTED NOT DETECTED Final   Candida albicans NOT DETECTED NOT DETECTED Final   Candida glabrata NOT DETECTED  NOT DETECTED Final   Candida krusei NOT DETECTED NOT DETECTED Final   Candida parapsilosis NOT DETECTED NOT DETECTED Final   Candida tropicalis NOT DETECTED NOT DETECTED Final    Comment: Performed at [facility]  Urine culture     Status: None  Collection Time: 08/18/15 10:04 PM  Result Value Ref Range Status   Specimen Description URINE, CATHETERIZED  Final   Special Requests NONE  Final   Culture NO GROWTH Performed at [facility]   Final   Report Status 08/20/2015 FINAL  Final  Blood Culture (routine x 2)     Status: None (Preliminary result)   Collection Time: 08/18/15 10:18 PM  Result Value Ref Range Status   Specimen Description BLOOD LEFT HAND  Final   Special Requests BOTTLES DRAWN AEROBIC AND ANAEROBIC 5CC  Final   Culture   Final    NO GROWTH 4 DAYS Performed at [facility]    Report Status PENDING  Incomplete  MRSA PCR Screening     Status: None   Collection Time: 08/19/15  2:05 AM  Result Value Ref Range Status   MRSA by PCR NEGATIVE NEGATIVE Final    Comment:        The GeneXpert MRSA Assay (FDA approved for NASAL specimens only), is one component of a comprehensive MRSA colonization surveillance program. It is not intended to diagnose MRSA infection nor to guide or monitor treatment for MRSA infections.          Radiology Studies: No results found.      Scheduled Meds: . amitriptyline  100 mg Oral QHS  . aspirin EC  81 mg Oral q morning - 10a  . atorvastatin  80 mg Oral Daily  . furosemide  40 mg Intravenous BID  . heparin  5,000 Units Subcutaneous Q8H  . insulin aspart  1-3 Units Subcutaneous Q4H  . levofloxacin (LEVAQUIN) IV  750 mg Intravenous Q24H  . LORazepam  0.5 mg Oral BID  . pantoprazole  40 mg Oral Daily  . senna-docusate  2 tablet Oral BID   Continuous Infusions:     LOS: 5 days     CHIU, Orpah Melter, MD [facility] Pager: [phone]  If 7PM-7AM, please contact  night-coverage [email or web address] Password [email or web address] 08/24/2015, 2:57 PM

## 2015-08-24 NOTE — NC FL2 (Signed)
[place] MEDICAID FL2 LEVEL OF CARE SCREENING TOOL     IDENTIFICATION  Patient Name: [name] Birthdate: 1935-05-04 Sex: female Admission Date (Current Location): 08/18/2015  [facility] and [place] Number:  Herbalist and Address:  [facility],  [address], [place]      Provider Number: [id number]  Attending Physician Name and Address:  Donne Hazel, MD  Relative Name and Phone Number:       Current Level of Care: Hospital Recommended Level of Care: [place] Prior Approval Number:    Date Approved/Denied:   PASRR Number: [id number]  Discharge Plan: SNF    Current Diagnoses: Patient Active Problem List   Diagnosis Date Noted  . Arterial hypotension   . Sepsis ([place])   . Hypotension 08/19/2015  . Cerebrovascular disease 06/02/2015  . Abdominal pain 06/17/2014  . Bruit 06/07/2014  . Onychomycosis 05/21/2014  . Adnexal mass 01/17/2014  . Ovarian cancer screening 11/12/2013  . Port catheter in place 09/05/2013  . Elevated liver function tests 08/08/2013  . History of B-cell lymphoma 05/10/2013  . Delusional disorder ([place]) 02/15/2012  . EDEMA 10/23/2009  . Coronary atherosclerosis 08/11/2009  . ANXIETY 07/29/2008  . OSTEOARTHRITIS 07/29/2008  . DYSPNEA 10/24/2007  . Hyperlipidemia, mixed 09/22/2007  . Essential hypertension 09/22/2007  . ALLERGIC RHINITIS 09/22/2007  . Insomnia with sleep apnea 09/22/2007  . 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 - 09/22/2007    Orientation RESPIRATION BLADDER Height & Weight     Self, Time, Situation, Place  O2 (5L) Incontinent, Indwelling catheter Weight: 110 kg (242 lb 8.1 oz) Height:  5\' 5"  (165.1 cm)  BEHAVIORAL SYMPTOMS/MOOD NEUROLOGICAL BOWEL NUTRITION STATUS   (NONE)  (NONE) Continent Diet (Heart Healthy Carb Modified)  AMBULATORY STATUS COMMUNICATION OF NEEDS Skin   Limited Assist Verbally Normal                        Personal Care Assistance Level of Assistance  Bathing, Feeding, Dressing Bathing Assistance: Limited assistance Feeding assistance: Independent Dressing Assistance: Limited assistance     Functional Limitations Info  Sight, Hearing, Speech Sight Info: Adequate Hearing Info: Adequate Speech Info: Adequate    SPECIAL CARE FACTORS FREQUENCY  PT (By licensed PT), OT (By licensed OT)     PT Frequency: 5/week OT Frequency: 5/week            Contractures Contractures Info: Not present    Additional Factors Info  Code Status, Allergies, Insulin Sliding Scale, Psychotropic Code Status Info: Full Allergies Info: NKDA Psychotropic Info: Ativan Insulin Sliding Scale Info: 6/day       Current Medications (08/24/2015):  This is the current hospital active medication list Current Facility-Administered Medications  Medication Dose Route Frequency Provider Last Rate Last Dose  . 0.9 %  sodium chloride infusion  250 mL Intravenous PRN Corey Harold, NP      . amitriptyline (ELAVIL) tablet 100 mg  100 mg Oral QHS Juanito Doom, MD   100 mg at 08/23/15 2206  . aspirin EC tablet 81 mg  81 mg Oral q morning - 10a Corey Harold, NP   81 mg at 08/24/15 1002  . atorvastatin (LIPITOR) tablet 80 mg  80 mg Oral Daily Corey Harold, NP   80 mg at 08/23/15 1821  . furosemide (LASIX) injection 40 mg  40 mg Intravenous BID Donne Hazel, MD   40 mg  at 08/24/15 0819  . heparin injection 5,000 Units  5,000 Units Subcutaneous Q8H Corey Harold, NP   5,000 Units at 08/24/15 [phone]  . insulin aspart (novoLOG) injection 1-3 Units  1-3 Units Subcutaneous Q4H Brand Males, MD   1 Units at 08/24/15 0455  . levofloxacin (LEVAQUIN) IVPB 750 mg  750 mg Intravenous Q24H Thomes Lolling, RPH   750 mg at 08/23/15 2022  . LORazepam (ATIVAN) tablet 0.5 mg  0.5 mg Oral BID Donita Brooks, NP   0.5 mg at 08/24/15 1003  . pantoprazole (PROTONIX) EC tablet 40 mg  40 mg Oral Daily Corey Harold, NP   40  mg at 08/24/15 1002  . polyethylene glycol (MIRALAX / GLYCOLAX) packet 17 g  17 g Oral BID PRN Corey Harold, NP      . senna-docusate (Senokot-S) tablet 2 tablet  2 tablet Oral BID Brand Males, MD   2 tablet at 08/24/15 1003  . traMADol (ULTRAM) tablet 25 mg  25 mg Oral Q12H PRN Donita Brooks, NP   25 mg at 08/24/15 1026     Discharge Medications: Please see discharge summary for a list of discharge medications.  Relevant Imaging Results:  Relevant Lab Results:   Additional Information SSN: [id number]  Rigoberto Noel, LCSW

## 2015-08-24 NOTE — Progress Notes (Signed)
Pt arrived on floor. Placed in room 1437. Alert and oriented, anxious with lots of questions/concerns. All pt's questions/concerns  Answered/addressed with to her satisfaction. Pt made comfortable. CCMD notified of pt's arrival. 2nd verifier in room and call placed. Pt resting comfortably at the moment. VWilliams,rn.

## 2015-08-25 ENCOUNTER — Ambulatory Visit (HOSPITAL_COMMUNITY): Payer: PPO

## 2015-08-25 ENCOUNTER — Inpatient Hospital Stay (HOSPITAL_COMMUNITY): Payer: PPO

## 2015-08-25 DIAGNOSIS — R531 Weakness: Secondary | ICD-10-CM

## 2015-08-25 DIAGNOSIS — R7989 Other specified abnormal findings of blood chemistry: Secondary | ICD-10-CM

## 2015-08-25 DIAGNOSIS — Z853 Personal history of malignant neoplasm of breast: Secondary | ICD-10-CM

## 2015-08-25 DIAGNOSIS — Z8572 Personal history of non-Hodgkin lymphomas: Secondary | ICD-10-CM

## 2015-08-25 DIAGNOSIS — I509 Heart failure, unspecified: Secondary | ICD-10-CM

## 2015-08-25 LAB — BASIC METABOLIC PANEL
ANION GAP: 8 (ref 5–15)
BUN: 20 mg/dL (ref 6–20)
CALCIUM: 7.5 mg/dL — AB (ref 8.9–10.3)
CO2: 22 mmol/L (ref 22–32)
CREATININE: 0.85 mg/dL (ref 0.44–1.00)
Chloride: 109 mmol/L (ref 101–111)
GLUCOSE: 117 mg/dL — AB (ref 65–99)
POTASSIUM: 3.7 mmol/L (ref 3.5–5.1)
SODIUM: 139 mmol/L (ref 135–145)

## 2015-08-25 LAB — CBC
HEMATOCRIT: 31 % — AB (ref 36.0–46.0)
Hemoglobin: 10.3 g/dL — ABNORMAL LOW (ref 12.0–15.0)
MCH: 27.6 pg (ref 26.0–34.0)
MCHC: 33.2 g/dL (ref 30.0–36.0)
MCV: 83.1 fL (ref 78.0–100.0)
PLATELETS: 256 10*3/uL (ref 150–400)
RBC: 3.73 MIL/uL — ABNORMAL LOW (ref 3.87–5.11)
RDW: 19 % — AB (ref 11.5–15.5)
WBC: 16.7 10*3/uL — ABNORMAL HIGH (ref 4.0–10.5)

## 2015-08-25 LAB — GLUCOSE, CAPILLARY
GLUCOSE-CAPILLARY: 116 mg/dL — AB (ref 65–99)
Glucose-Capillary: 127 mg/dL — ABNORMAL HIGH (ref 65–99)
Glucose-Capillary: 144 mg/dL — ABNORMAL HIGH (ref 65–99)
Glucose-Capillary: 133 mg/dL — ABNORMAL HIGH (ref 65–99)
Glucose-Capillary: 127 mg/dL — ABNORMAL HIGH (ref 65–99)
Glucose-Capillary: 101 mg/dL — ABNORMAL HIGH (ref 65–99)

## 2015-08-25 LAB — ECHOCARDIOGRAM COMPLETE
Height: 65 in
Weight: 3791.91 oz

## 2015-08-25 MED ORDER — BISMUTH SUBSALICYLATE 262 MG/15ML PO SUSP
30.0000 mL | ORAL | Status: DC | PRN
  Filled 2015-08-25: qty 118

## 2015-08-25 MED ORDER — LEVOFLOXACIN 750 MG PO TABS
750.0000 mg | ORAL_TABLET | ORAL | Status: DC
  Administered 2015-08-25 – 2015-08-29 (×5): 750 mg via ORAL
  Filled 2015-08-25 (×5): qty 1

## 2015-08-25 NOTE — Progress Notes (Signed)
PT Cancellation Note  Patient Details Name: Kerin Tremonti Chumley MRN: [id number] DOB: 10/28/1935   Cancelled Treatment:    Reason Eval/Treat Not Completed: Medical issues which prohibited therapy;Pain limiting ability to participate ( Patient stated " I am in pain, I don't want to talk. I have spoken My peace". )   Claretha Cooper 08/25/2015, 4:17 PM Tresa Endo PT [phone]

## 2015-08-25 NOTE — Care Management Important Message (Signed)
Important Message  Patient Details  Name: [name] MRN: [id number] Date of Birth: 1935/06/27   Medicare Important Message Given:  Yes    Camillo Flaming 08/25/2015, 11:03 AMImportant Message  Patient Details  Name: [name] MRN: [id number] Date of Birth: [birth year]   Medicare Important Message Given:  Yes    Camillo Flaming 08/25/2015, 11:03 AM

## 2015-08-25 NOTE — Clinical Social Work Placement (Signed)
   CLINICAL SOCIAL WORK PLACEMENT  NOTE  Date:  08/25/2015  Patient Details  Name: [name] MRN: [id number] Date of Birth: [birth year]  Clinical Social Work is seeking post-discharge placement for this patient at the [place] level of care (*CSW will initial, date and re-position this form in  chart as items are completed):  Yes   Patient/family provided with [place] Work Department's list of facilities offering this level of care within the geographic area requested by the patient (or if unable, by the patient's family).  Yes   Patient/family informed of their freedom to choose among providers that offer the needed level of care, that participate in Medicare, Medicaid or managed care program needed by the patient, have an available bed and are willing to accept the patient.  Yes   Patient/family informed of [facility]'s ownership interest in [facility] and [facility], as well as of the fact that they are under no obligation to receive care at these facilities.  PASRR submitted to EDS on       PASRR number received on       Existing PASRR number confirmed on 08/24/15     FL2 transmitted to all facilities in geographic area requested by pt/family on 08/24/15     FL2 transmitted to all facilities within larger geographic area on       Patient informed that his/her managed care company has contracts with or will negotiate with certain facilities, including the following:            Patient/family informed of bed offers received.  Patient chooses bed at       Physician recommends and patient chooses bed at      Patient to be transferred to   on  .  Patient to be transferred to facility by       Patient family notified on   of transfer.  Name of family member notified:        PHYSICIAN       Additional Comment:    _______________________________________________ Weston Anna, LCSW 08/25/2015, 2:59 PM

## 2015-08-25 NOTE — Progress Notes (Signed)
PROGRESS NOTE    [name]  [id number] DOB: 1935-08-14 DOA: 08/18/2015 PCP:  Melinda Crutch, MD    Brief Narrative: 80 year old with history of OSA on nocturnal O2, breast cancer, large cell non-Hodgkin's lymphoma status post chemotherapy followed by Dr. and ever who presents to the emergency department with cold type symptoms and decreased by mouth intake. Patient was found to have a low-grade temperature with white blood count of 26.2. Lactic acid and creatinine were elevated mildly, patient was also hypotensive. Patient was admitted to the critical care service. Ultimately, patient's hemodynamics improved and patient was simply transferred to the hospital service on 08/22/2015.  Assessment & Plan:   Active Problems:   Essential hypertension   Insomnia with sleep apnea   History of B-cell lymphoma   Hypotension   Arterial hypotension   Sepsis (HCC)  Hypotension - Blood pressure improved and noted to be responsive to IV fluids - Unclear etiology - Patient tolerating by mouth intake well. - BP presently normal off fluids  Elevated lactic acid - Improved with IVF  Acute kidney injury - Renal function improved since admission - Continue monitor renal function - Have started scheduled lasix to aide in diuresis - Will obtain 2d echo. Last EF from 2008 of 55-60%  Hyponatremia - Sodium normalized - Continue to follow  Stool incontinence - Abdominal imaging reviewed. Patient with stool noted (see below)  Leukocytosis - Patient had been on broad-spectrum antibiotics. - Blood cultures notable only for 1 out of 2 coag negative staph - Repeat chest x-ray this morning with findings that cannot exclude a possible mild right upper lobe infiltrate. - transitioned patient to Levaquin for possible HCAP. - WBC showed improvement  History of breast cancer/non-Hodgkin's lymphoma - Have alerted pt's oncologist of her admission  Possible sepsis with pneumonia, present on admit - CXR  with findings suggestive of pna - On empiric levaquin  Constipation - MiraLAX ordered. Would continue for now. - Per staff, good results recently   DVT prophylaxis: Heparin subcutaneous Code Status: Full Family Communication: Patient in room, family not at bedside Disposition Plan: Uncertain at this time   Consultants:   Critical care   Procedures:   Antimicrobials:  Anti-infectives    Start     Dose/Rate Route Frequency Ordered Stop   08/25/15 2000  levofloxacin (LEVAQUIN) tablet 750 mg     750 mg Oral Every 24 hours 08/25/15 1106     08/22/15 2000  levofloxacin (LEVAQUIN) IVPB 750 mg  Status:  Discontinued     750 mg 100 mL/hr over 90 Minutes Intravenous Every 24 hours 08/22/15 1808 08/25/15 1106   08/21/15 2200  vancomycin (VANCOCIN) 1,250 mg in sodium chloride 0.9 % 250 mL IVPB  Status:  Discontinued     1,250 mg 166.7 mL/hr over 90 Minutes Intravenous Every 24 hours 08/21/15 0808 08/22/15 1758   08/20/15 2100  vancomycin (VANCOCIN) IVPB 1000 mg/200 mL premix  Status:  Discontinued     1,000 mg 200 mL/hr over 60 Minutes Intravenous Every 48 hours 08/18/15 2114 08/21/15 0808   08/20/15 1800  piperacillin-tazobactam (ZOSYN) IVPB 3.375 g  Status:  Discontinued     3.375 g 12.5 mL/hr over 240 Minutes Intravenous Every 8 hours 08/20/15 1346 08/21/15 1201   08/19/15 0500  piperacillin-tazobactam (ZOSYN) IVPB 2.25 g  Status:  Discontinued     2.25 g 100 mL/hr over 30 Minutes Intravenous Every 8 hours 08/18/15 2116 08/20/15 1346   08/18/15 2115  piperacillin-tazobactam (ZOSYN) IVPB 2.25 g  2.25 g 100 mL/hr over 30 Minutes Intravenous STAT 08/18/15 2112 08/18/15 2252   08/18/15 2100  piperacillin-tazobactam (ZOSYN) IVPB 3.375 g  Status:  Discontinued     3.375 g 100 mL/hr over 30 Minutes Intravenous  Once 08/18/15 2051 08/18/15 2112   08/18/15 2100  vancomycin (VANCOCIN) IVPB 1000 mg/200 mL premix     1,000 mg 200 mL/hr over 60 Minutes Intravenous  Once 08/18/15 2051  08/18/15 2304        Subjective: No complaints  Objective: Filed Vitals:   08/24/15 1452 08/24/15 1900 08/25/15 0450 08/25/15 0747  BP: 137/56 103/63 109/56   Pulse: 102 101 98   Temp: 98.1 F (36.7 C) 98.8 F (37.1 C) 98 F (36.7 C)   TempSrc: Oral Oral Oral   Resp: 26 24 24    Height:      Weight:   107.5 kg (236 lb 15.9 oz)   SpO2: 95% 97% 87% 92%    Intake/Output Summary (Last 24 hours) at 08/25/15 1352 Last data filed at 08/25/15 1108  Gross per 24 hour  Intake   1030 ml  Output   4040 ml  Net  -3010 ml   Filed Weights   08/23/15 0500 08/24/15 0500 08/25/15 0450  Weight: 110.3 kg (243 lb 2.7 oz) 110 kg (242 lb 8.1 oz) 107.5 kg (236 lb 15.9 oz)    Examination:  General exam: Appears calm and comfortable, laying in bed on bedpan Respiratory system: Clear to auscultation. Respiratory effort normal. Cardiovascular system: S1 & S2 heard, RRR Gastrointestinal system: Abdomen Obese is nondistended, soft and nontender. No organomegaly or masses felt. Normal bowel sounds heard. Central nervous system: Alert and oriented. No focal neurological deficits. Extremities: Symmetric 5 x 5 power. Skin: No rashes, lesions Psychiatry: Judgement and insight appear normal. Mood & affect appropriate.    Data Reviewed: I have personally reviewed following labs and imaging studies  CBC:  Recent Labs Lab 08/18/15 2015  08/21/15 0500 08/22/15 0458 08/23/15 0528 08/24/15 0343 08/25/15 0410  WBC 26.2*  < > 22.2* 22.5* 19.0* 19.7* 16.7*  NEUTROABS 23.9*  --  18.3* 16.8* 14.3*  --   --   HGB 14.5  < > 12.0 11.2* 10.5* 10.7* 10.3*  HCT 41.9  < > 35.4* 32.1* 31.5* 30.6* 31.0*  MCV 80.0  < > 81.2 79.3 80.8 79.5 83.1  PLT 185  < > 229 223 216 243 256  < > = values in this interval not displayed. Basic Metabolic Panel:  Recent Labs Lab 08/19/15 0214  08/20/15 1830 08/21/15 0500 08/22/15 0458 08/23/15 0528 08/25/15 0410  NA 133*  < > 133* 133* 136 137 139  K 4.1  < > 4.0  4.4 4.0 4.2 3.7  CL 105  < > 113* 114* 116* 116* 109  CO2 14*  < > 14* 15* 15* 17* 22  GLUCOSE 166*  < > 158* 155* 135* 120* 117*  BUN 75*  < > 44* 43* 41* 34* 20  CREATININE 3.02*  < > 1.49* 1.37* 1.03* 0.82 0.85  CALCIUM 7.1*  < > 7.1* 7.0* 7.4* 7.6* 7.5*  MG 2.0  --   --  1.4* 1.8 1.6*  --   PHOS 4.6  --   --  3.0 2.6 2.5  --   < > = values in this interval not displayed. GFR: Estimated Creatinine Clearance: 66.5 mL/min (by C-G formula based on Cr of 0.85). Liver Function Tests:  Recent Labs Lab 08/18/15 2015  AST 115*  ALT 83*  ALKPHOS 169*  BILITOT 1.1  PROT 6.5  ALBUMIN 3.2*   No results for input(s): LIPASE, AMYLASE in the last 168 hours. No results for input(s): AMMONIA in the last 168 hours. Coagulation Profile:  Recent Labs Lab 08/18/15 2218 08/19/15 0214  INR 1.25 1.37   Cardiac Enzymes:  Recent Labs Lab 08/19/15 0214 08/20/15 1500  TROPONINI 0.04* 0.03*   BNP (last 3 results) No results for input(s): PROBNP in the last 8760 hours. HbA1C: No results for input(s): HGBA1C in the last 72 hours. CBG:  Recent Labs Lab 08/24/15 2009 08/25/15 0110 08/25/15 0505 08/25/15 0747 08/25/15 1251  GLUCAP 129* 127* 116* 144* 133*   Lipid Profile: No results for input(s): CHOL, HDL, LDLCALC, TRIG, CHOLHDL, LDLDIRECT in the last 72 hours. Thyroid Function Tests: No results for input(s): TSH, T4TOTAL, FREET4, T3FREE, THYROIDAB in the last 72 hours. Anemia Panel: No results for input(s): VITAMINB12, FOLATE, FERRITIN, TIBC, IRON, RETICCTPCT in the last 72 hours. Sepsis Labs:  Recent Labs Lab 08/19/15 0128 08/19/15 0214 08/19/15 0810 08/19/15 0815 08/19/15 1458 08/21/15 0500  PROCALCITON  --   --  0.76  --   --  0.23  LATICACIDVEN 2.06* 2.2*  --  2.0* 1.9  --     Recent Results (from the past 240 hour(s))  Blood Culture (routine x 2)     Status: Abnormal   Collection Time: 08/18/15  8:50 PM  Result Value Ref Range Status   Specimen Description  BLOOD LEFT ANTECUBITAL  Final   Special Requests BOTTLES DRAWN AEROBIC AND ANAEROBIC 5 ML  Final   Culture  Setup Time   Final    GRAM POSITIVE COCCI IN CLUSTERS AEROBIC BOTTLE ONLY Organism ID to follow CRITICAL RESULT CALLED TO, READ BACK BY AND VERIFIED WITH: B GREEN PHARMD [id number] 08/19/15 A BROWNING    Culture (A)  Final    STAPHYLOCOCCUS SPECIES (COAGULASE NEGATIVE) THE SIGNIFICANCE OF ISOLATING THIS ORGANISM FROM A SINGLE SET OF BLOOD CULTURES WHEN MULTIPLE SETS ARE DRAWN IS UNCERTAIN. PLEASE NOTIFY THE MICROBIOLOGY DEPARTMENT WITHIN ONE WEEK IF SPECIATION AND SENSITIVITIES ARE REQUIRED. Performed at [facility]    Report Status 08/22/2015 FINAL  Final  Blood Culture ID Panel (Reflexed)     Status: Abnormal   Collection Time: 08/18/15  8:50 PM  Result Value Ref Range Status   Enterococcus species NOT DETECTED NOT DETECTED Final   Vancomycin resistance NOT DETECTED NOT DETECTED Final   Listeria monocytogenes NOT DETECTED NOT DETECTED Final   Staphylococcus species DETECTED (A) NOT DETECTED Final    Comment: CRITICAL RESULT CALLED TO, READ BACK BY AND VERIFIED WITH: B GREEN PHARMD 2258 08/19/15 A BROWNING    Staphylococcus aureus NOT DETECTED NOT DETECTED Final   Methicillin resistance NOT DETECTED NOT DETECTED Final   Streptococcus species NOT DETECTED NOT DETECTED Final   Streptococcus agalactiae NOT DETECTED NOT DETECTED Final   Streptococcus pneumoniae NOT DETECTED NOT DETECTED Final   Streptococcus pyogenes NOT DETECTED NOT DETECTED Final   Acinetobacter baumannii NOT DETECTED NOT DETECTED Final   Enterobacteriaceae species NOT DETECTED NOT DETECTED Final   Enterobacter cloacae complex NOT DETECTED NOT DETECTED Final   Escherichia coli NOT DETECTED NOT DETECTED Final   Klebsiella oxytoca NOT DETECTED NOT DETECTED Final   Klebsiella pneumoniae NOT DETECTED NOT DETECTED Final   Proteus species NOT DETECTED NOT DETECTED Final   Serratia marcescens NOT DETECTED NOT  DETECTED Final   Carbapenem resistance NOT DETECTED NOT DETECTED Final  Haemophilus influenzae NOT DETECTED NOT DETECTED Final   Neisseria meningitidis NOT DETECTED NOT DETECTED Final   Pseudomonas aeruginosa NOT DETECTED NOT DETECTED Final   Candida albicans NOT DETECTED NOT DETECTED Final   Candida glabrata NOT DETECTED NOT DETECTED Final   Candida krusei NOT DETECTED NOT DETECTED Final   Candida parapsilosis NOT DETECTED NOT DETECTED Final   Candida tropicalis NOT DETECTED NOT DETECTED Final    Comment: Performed at [facility]  Urine culture     Status: None   Collection Time: 08/18/15 10:04 PM  Result Value Ref Range Status   Specimen Description URINE, CATHETERIZED  Final   Special Requests NONE  Final   Culture NO GROWTH Performed at [facility]   Final   Report Status 08/20/2015 FINAL  Final  Blood Culture (routine x 2)     Status: None   Collection Time: 08/18/15 10:18 PM  Result Value Ref Range Status   Specimen Description BLOOD LEFT HAND  Final   Special Requests BOTTLES DRAWN AEROBIC AND ANAEROBIC 5CC  Final   Culture   Final    NO GROWTH 5 DAYS Performed at [facility]    Report Status 08/24/2015 FINAL  Final  MRSA PCR Screening     Status: None   Collection Time: 08/19/15  2:05 AM  Result Value Ref Range Status   MRSA by PCR NEGATIVE NEGATIVE Final    Comment:        The GeneXpert MRSA Assay (FDA approved for NASAL specimens only), is one component of a comprehensive MRSA colonization surveillance program. It is not intended to diagnose MRSA infection nor to guide or monitor treatment for MRSA infections.          Radiology Studies: Dg Abd Portable 1v  08/25/2015  CLINICAL DATA:  Constipation Hx colon polyps. Hx SBO. Hx hiatal hernia. Hx breast cancer. Hx gallbladder surgery. EXAM: PORTABLE ABDOMEN - 1 VIEW COMPARISON:  CT, 08/18/2015 FINDINGS: Normal bowel gas pattern with no evidence of obstruction or generalized adynamic  ileus. No significant increase in colonic stool. No free air. Soft tissues are unremarkable. IMPRESSION: 1. No acute findings. No evidence of bowel obstruction or generalized adynamic ileus. No significant increase in colonic stool. Electronically Signed   By: Lajean Manes M.D.   On: 08/25/2015 12:37        Scheduled Meds: . amitriptyline  100 mg Oral QHS  . aspirin EC  81 mg Oral q morning - 10a  . atorvastatin  80 mg Oral Daily  . furosemide  40 mg Intravenous BID  . heparin  5,000 Units Subcutaneous Q8H  . insulin aspart  1-3 Units Subcutaneous Q4H  . levofloxacin  750 mg Oral Q24H  . LORazepam  0.5 mg Oral BID  . pantoprazole  40 mg Oral Daily  . senna-docusate  2 tablet Oral BID   Continuous Infusions:     LOS: 6 days     CHIU, Orpah Melter, MD [facility] Pager: [phone]  If 7PM-7AM, please contact night-coverage [email or web address] Password [email or web address] 08/25/2015, 1:52 PM

## 2015-08-25 NOTE — Progress Notes (Signed)
Scant amount of vaginal bleeding noted intermittently. SRP, RN

## 2015-08-25 NOTE — Progress Notes (Signed)
  Echocardiogram 2D Echocardiogram has been performed.  Tresa Res 08/25/2015, 1:08 PM

## 2015-08-25 NOTE — Consult Note (Signed)
Referral MD  Reason for Referral: History of large cell non-Hodgkin's lymphoma and breast cancer.   Chief Complaint  Patient presents with  . Weakness  : Patient says she is just weak.  HPI: [name] is a very nice 80 year old white female. Shows multiple medical problems. I saw her one time back in May. At that point time, she had a history of lobular carcinoma of the right breast back in 2002. This really has not been an issue.  She was diagnosed with large cell non-Hodgkin's lymphoma back in 2015. She had a poor performance status. She was treated with chemotherapy with Rituxan/Treanda. She has 6 cycles of treatment.  When I saw her, she had a recent PET scan which showed some possible abdominal and hilar lymph nodes. She was totally asymptomatic.  She has been seen by gynecology. She is supposed to have a surgical procedure on the 11th which obviously is not going to happen. She's had an Demetrio biopsies which have been negative.  She was admitted because of possible septic-like issues. She had one positive culture of blood for coagulation-negative staph. She did have a chest x-ray which showed a possible right upper lobe infiltrate. As such, I think she may be treated for pneumonia. There is nothing that looks like there is adenopathy in the hilum or mediastinum.  She was initially admitted to the ICU but now is over the floor.  When she came in, she had elevated white cell count of 26,000. Hemoglobin was 14.5. Platelet count is okay. Her electrolytes all looked okay. She has dehydration. However with hydration her kidney function has normalized. She did not have hypercalcemia. Liver tests showed an elevated alkaline phosphatase. Her AST and ALT were also somewhat elevated. She had a CT scan of the abdomen and pelvis. This showed stable abdominal lymph nodes. Her liver looked okay. She had post cholecystectomy changes.  Again, she has quite a few health issues. She does not have the  best performance status.  We were notified of her admission. We are seeing her to try to help out with any issues that might be oncologic.    Past Medical History  Diagnosis Date  . Hyperlipidemia     takes Atorvastatin daily  . Chronic headache   . Osteoarthritis   . Chronic chest pain   . Hypertension     takes Quinapril daily  . Low back pain   . GERD (gastroesophageal reflux disease)     takes Nexium daily  . History of colon polyps     hyperplastic polyp's  2003;  2006;  2011  . Depression     takes Elavil nightly  . Dyspnea   . Anxiety     takes Ativan daily  . History of panic attacks   . Hiatal hernia   . History of small bowel obstruction     08/ 2014  parital sbo  resolved without surgical intervention  . History of colon polyps     hyperplastic 2003,  2006,  2011  . History of breast cancer oncologist-  dr Marin Olp--  no recurrence    dx 2002-- right breast Stage 1/2 lobular,  ER+/HER2 negative /  s/p  right mastectomy w/ node dissection and chemotherapy  . Bilateral carotid artery stenosis     per last duplex --  RICA and LICA  15-40-%  . PMB (postmenopausal bleeding)   . Thickened endometrium   . CAD (coronary artery disease)     cardiologist-  dr Stanford Breed  .  OSA (obstructive sleep apnea)     mild per study 10-14-2007-  cpap intolerent, uses O2 supplement at night  . Diffuse large cell non-Hodgkin's lymphoma [facility]) oncologist-  dr Marin Olp-  per last office note being monitored for possible recurrence    dx 03/ 2015 by lymph node bx/  Stage 3-  completed chemotherapy  . S/P drug eluting coronary stent placement     pLAD 07-09-2009  :  Past Surgical History  Procedure Laterality Date  . Tonsillectomy    . Inner ear surgery    . Submandibular gland excision Left 05/10/2013    Procedure: EXCISION LEFT SUBMANDIBULAR NODE;  Surgeon: Jodi Marble, MD;  Location: [facility];  Service: ENT;  Laterality: Left;  . Portacath placement  06/04/13    and removal of  previous pac  . Mastectomy with axillary lymph node dissection Right 01-03-2001  . Cholecystectomy  2005  . Cataract extraction w/ intraocular lens  implant, bilateral  2011  . Knee arthroscopy Right   . Coronary angioplasty with stent placement  07-09-2009  dr Darnell Level brodie    PCI and DES x1 to  pLAD/  normal LVF, ef 60%  . Cardiac catheterization  07-15-2010   dr Kathlyn [place]    no evidence obstructive cad/  patent LAD stent with no significant restenosis/  normal LVF  . Transthoracic echocardiogram  10-04-2006    ef 55-605/  trivial MR and TR/  mild AV sclerosis without stenosis (valve area 1.93cm^2)  . Cardiovascular stress test  12-24-2009    normal nuclear study w/ no ischemia/  normal LV function and wall motion, ef 72%  . Colonoscopy with esophagogastroduodenoscopy (egd)  11-12-2014    polypectomy /  normal egd  :   Current facility-administered medications:  .  0.9 %  sodium chloride infusion, 250 mL, Intravenous, PRN, Corey Harold, NP .  amitriptyline (ELAVIL) tablet 100 mg, 100 mg, Oral, QHS, Juanito Doom, MD, 100 mg at 08/24/15 2216 .  aspirin EC tablet 81 mg, 81 mg, Oral, q morning - 10a, Corey Harold, NP, 81 mg at 08/24/15 1002 .  atorvastatin (LIPITOR) tablet 80 mg, 80 mg, Oral, Daily, Corey Harold, NP, 80 mg at 08/24/15 1718 .  furosemide (LASIX) injection 40 mg, 40 mg, Intravenous, BID, Donne Hazel, MD, 40 mg at 08/24/15 1718 .  heparin injection 5,000 Units, 5,000 Units, Subcutaneous, Q8H, Corey Harold, NP, 5,000 Units at 08/25/15 0519 .  insulin aspart (novoLOG) injection 1-3 Units, 1-3 Units, Subcutaneous, Q4H, Brand Males, MD, 1 Units at 08/25/15 0136 .  levofloxacin (LEVAQUIN) IVPB 750 mg, 750 mg, Intravenous, Q24H, Thomes Lolling, RPH, 750 mg at 08/24/15 2041 .  LORazepam (ATIVAN) tablet 0.5 mg, 0.5 mg, Oral, BID, Donita Brooks, NP, 0.5 mg at 08/24/15 2216 .  pantoprazole (PROTONIX) EC tablet 40 mg, 40 mg, Oral, Daily, Corey Harold, NP,  40 mg at 08/24/15 1002 .  polyethylene glycol (MIRALAX / GLYCOLAX) packet 17 g, 17 g, Oral, BID PRN, Corey Harold, NP .  senna-docusate (Senokot-S) tablet 2 tablet, 2 tablet, Oral, BID, Brand Males, MD, 2 tablet at 08/24/15 2216 .  traMADol (ULTRAM) tablet 25 mg, 25 mg, Oral, Q12H PRN, Donita Brooks, NP, 25 mg at 08/24/15 1026:  . amitriptyline  100 mg Oral QHS  . aspirin EC  81 mg Oral q morning - 10a  . atorvastatin  80 mg Oral Daily  . furosemide  40 mg Intravenous BID  .  heparin  5,000 Units Subcutaneous Q8H  . insulin aspart  1-3 Units Subcutaneous Q4H  . levofloxacin (LEVAQUIN) IV  750 mg Intravenous Q24H  . LORazepam  0.5 mg Oral BID  . pantoprazole  40 mg Oral Daily  . senna-docusate  2 tablet Oral BID  :  No Known Allergies:  History reviewed. No pertinent family history.:  Social History   Social History  . Marital Status: Divorced    Spouse Name: N/A  . Number of Children: 2  . Years of Education: N/A   Occupational History  . Unemployed    Social History Main Topics  . Smoking status: Never Smoker   . Smokeless tobacco: Never Used  . Alcohol Use: No  . Drug Use: No  . Sexual Activity: Yes    Birth Control/ Protection: Post-menopausal   Other Topics Concern  . Not on file   Social History Narrative  :  Pertinent items are noted in HPI.  Exam: Patient Vitals for the past 24 hrs:  BP Temp Temp src Pulse Resp SpO2 Weight  08/25/15 0450 (!) 109/56 mmHg 98 F (36.7 C) Oral 98 (!) 24 (!) 87 % 236 lb 15.9 oz (107.5 kg)  08/24/15 1900 103/63 mmHg 98.8 F (37.1 C) Oral (!) 101 (!) 24 97 % -  08/24/15 1452 (!) 137/56 mmHg 98.1 F (36.7 C) Oral (!) 102 (!) 26 95 % -  08/24/15 1212 (!) 105/46 mmHg 98.9 F (37.2 C) Oral 100 (!) 26 96 % -  08/24/15 1000 (!) 126/56 mmHg - - 96 (!) 26 96 % -  08/24/15 0805 - 97.9 F (36.6 C) Oral - - - -  08/24/15 0800 (!) 124/55 mmHg - - 94 (!) 21 96 % -   As above    Recent Labs  08/24/15 0343 08/25/15 0410   WBC 19.7* 16.7*  HGB 10.7* 10.3*  HCT 30.6* 31.0*  PLT 243 256    Recent Labs  08/23/15 0528 08/25/15 0410  NA 137 139  K 4.2 3.7  CL 116* 109  CO2 17* 22  GLUCOSE 120* 117*  BUN 34* 20  CREATININE 0.82 0.85  CALCIUM 7.6* 7.5*    Blood smear review:  None  Pathology: None     Assessment and Plan:  [name] is aware nice 80 year old white female. She is medically because of possible septic issues. She had one culture that was positive which may be a contaminant.  I do not see anything that suggest malignancy as a cause for her issues. I would think that if she had diffuse large cell non-Hodgkin's lymphoma, any lymph nodes that were noted back in April will clearly be larger now. Thankfully, everything with her lymph nodes looks stable.  I'm not sure what's going on with the endometrium. Unfortunately, she is not to have surgery tomorrow. I don't know if her gynecologist knows that she is admitted. He probably does need to be made aware of the fact that she is hospitalized.  From my point of view, the fact that her white cell count was elevated certainly indicate so underlying infection. Her white cell count is coming down a little bit.  I think that she is getting outstanding care from everybody. I really cannot think of anything that needs to be done from my point of view. I don't see that we have to do any special scans on her. She's been treated for pneumonia.  We will follow along and help out in any way possible.  [name] E.    1 Thessalonians 5:16-18

## 2015-08-26 DIAGNOSIS — R419 Unspecified symptoms and signs involving cognitive functions and awareness: Secondary | ICD-10-CM

## 2015-08-26 DIAGNOSIS — G479 Sleep disorder, unspecified: Secondary | ICD-10-CM

## 2015-08-26 LAB — CBC
HEMATOCRIT: 32 % — AB (ref 36.0–46.0)
HEMOGLOBIN: 10.7 g/dL — AB (ref 12.0–15.0)
MCH: 27.6 pg (ref 26.0–34.0)
MCHC: 33.4 g/dL (ref 30.0–36.0)
MCV: 82.5 fL (ref 78.0–100.0)
Platelets: 258 10*3/uL (ref 150–400)
RBC: 3.88 MIL/uL (ref 3.87–5.11)
RDW: 18.5 % — ABNORMAL HIGH (ref 11.5–15.5)
WBC: 15.4 10*3/uL — AB (ref 4.0–10.5)

## 2015-08-26 LAB — BASIC METABOLIC PANEL
ANION GAP: 12 (ref 5–15)
BUN: 21 mg/dL — ABNORMAL HIGH (ref 6–20)
CHLORIDE: 110 mmol/L (ref 101–111)
CO2: 16 mmol/L — AB (ref 22–32)
CREATININE: 0.8 mg/dL (ref 0.44–1.00)
Calcium: 7.6 mg/dL — ABNORMAL LOW (ref 8.9–10.3)
GFR calc non Af Amer: >60 mL/min (ref >60)
Glucose, Bld: 112 mg/dL — ABNORMAL HIGH (ref 65–99)
POTASSIUM: 5.3 mmol/L — AB (ref 3.5–5.1)
Sodium: 138 mmol/L (ref 135–145)

## 2015-08-26 LAB — GLUCOSE, CAPILLARY
GLUCOSE-CAPILLARY: 100 mg/dL — AB (ref 65–99)
GLUCOSE-CAPILLARY: 101 mg/dL — AB (ref 65–99)
GLUCOSE-CAPILLARY: 122 mg/dL — AB (ref 65–99)
GLUCOSE-CAPILLARY: 145 mg/dL — AB (ref 65–99)
Glucose-Capillary: 104 mg/dL — ABNORMAL HIGH (ref 65–99)
Glucose-Capillary: 118 mg/dL — ABNORMAL HIGH (ref 65–99)
Glucose-Capillary: 95 mg/dL (ref 65–99)

## 2015-08-26 MED ORDER — FUROSEMIDE 10 MG/ML PO SOLN
40.0000 mg | Freq: Two times a day (BID) | ORAL | Status: DC
  Administered 2015-08-26: 40 mg via ORAL
  Filled 2015-08-26: qty 5
  Filled 2015-08-26 (×3): qty 4

## 2015-08-26 MED ORDER — OXYCODONE-ACETAMINOPHEN 5-325 MG PO TABS
1.0000 | ORAL_TABLET | Freq: Once | ORAL | Status: AC
  Administered 2015-08-26: 1 via ORAL
  Filled 2015-08-26: qty 1

## 2015-08-26 NOTE — Progress Notes (Signed)
PT Cancellation Note  Patient Details Name: [name] MRN: [id number] DOB: [birth year]   Cancelled Treatment:    Reason Eval/Treat Not Completed: Patient at procedure or test/unavailable. Will check back if/when schedule allows. Thanks.   Weston Anna, MPT Pager: [phone]

## 2015-08-26 NOTE — Progress Notes (Signed)
PT Cancellation Note  Patient Details Name: [name] MRN: [id number] DOB: 03/22/1935   Cancelled Treatment:    Reason Eval/Treat Not Completed: Pain limiting ability to participate. Pt declined to participate this pm due to abdominal pain. Will check back on tomorrow to attempt eval/mobility if pt will agree to participate. Pt reported to therapist that she lives alone. Per pt, she is nonambulatory. She transfers to/from her wheelchair. She states she has an aide that comes out 2x/week for 4 hours that helps around the house. Pt states she cannot afford co-pay for HHPT and that her insurance doesn't cover SNF??? Encouraged pt to speak with CM, SW and other staff when they come by so that they can help her.    Weston Anna, MPT Pager: [phone]

## 2015-08-26 NOTE — Progress Notes (Signed)
PROGRESS NOTE    Annelisse Southerland Fatzinger  [id number] DOB: Jan 29, 1936 DOA: 08/18/2015 PCP:  Melinda Crutch, MD    Brief Narrative: 80 year old with history of OSA on nocturnal O2, breast cancer, large cell non-Hodgkin's lymphoma status post chemotherapy followed by Dr. and ever who presents to the emergency department with cold type symptoms and decreased by mouth intake. Patient was found to have a low-grade temperature with white blood count of 26.2. Lactic acid and creatinine were elevated mildly, patient was also hypotensive. Patient was admitted to the critical care service. Ultimately, patient's hemodynamics improved and patient was simply transferred to the hospital service on 08/22/2015.  Assessment & Plan:   Active Problems:   Essential hypertension   Insomnia with sleep apnea   History of B-cell lymphoma   Hypotension   Arterial hypotension   Sepsis (HCC)  Hypotension - Blood pressure improved and noted to be responsive to IV fluids - Unclear etiology - Patient tolerating by mouth intake well. - BP presently stable off fluids  Elevated lactic acid - Improved with IVF  Acute kidney injury - Renal function improved since admission - Continue monitor renal function - Have started scheduled lasix to aide in diuresis - 2-D echo with EF of 66 5% with grade 1 diastolic dysfunction. Essentially unchanged from prior study  Hyponatremia - Sodium normalized - Continue to follow  Stool incontinence - Abdominal imaging reviewed. Patient with stool noted (see below)  Leukocytosis - Patient had been on broad-spectrum antibiotics. - Blood cultures notable only for 1 out of 2 coag negative staph - Repeat chest x-ray with findings that cannot exclude a possible mild right upper lobe infiltrate. - transitioned patient to Levaquin for possible HCAP. - WBC is showing steady improvement. Plan to continue course of Levaquin for now.  History of breast cancer/non-Hodgkin's lymphoma - Have  alerted pt's oncologist of her admission  Possible sepsis with pneumonia, present on admit - CXR with findings suggestive of pna - On empiric levaquin  Constipation - MiraLAX ordered. Would continue for now. - Continue cathartics as needed  Disposition -Informed by social worker that patient is currently being reviewed by Adult Protective Services -Patient has been recommended by therapy for skilled nursing level of care. -Patient adamantly declines skilled nursing facility -Will consult psychiatry to establish decision-making capacity   DVT prophylaxis: Heparin subcutaneous Code Status: Full Family Communication: Patient in room, family not at bedside Disposition Plan: Uncertain at this time   Consultants:   Critical care   Procedures:   Antimicrobials:  Anti-infectives    Start     Dose/Rate Route Frequency Ordered Stop   08/25/15 2000  levofloxacin (LEVAQUIN) tablet 750 mg     750 mg Oral Every 24 hours 08/25/15 1106     08/22/15 2000  levofloxacin (LEVAQUIN) IVPB 750 mg  Status:  Discontinued     750 mg 100 mL/hr over 90 Minutes Intravenous Every 24 hours 08/22/15 1808 08/25/15 1106   08/21/15 2200  vancomycin (VANCOCIN) 1,250 mg in sodium chloride 0.9 % 250 mL IVPB  Status:  Discontinued     1,250 mg 166.7 mL/hr over 90 Minutes Intravenous Every 24 hours 08/21/15 0808 08/22/15 1758   08/20/15 2100  vancomycin (VANCOCIN) IVPB 1000 mg/200 mL premix  Status:  Discontinued     1,000 mg 200 mL/hr over 60 Minutes Intravenous Every 48 hours 08/18/15 2114 08/21/15 0808   08/20/15 1800  piperacillin-tazobactam (ZOSYN) IVPB 3.375 g  Status:  Discontinued     3.375 g 12.5  mL/hr over 240 Minutes Intravenous Every 8 hours 08/20/15 1346 08/21/15 1201   08/19/15 0500  piperacillin-tazobactam (ZOSYN) IVPB 2.25 g  Status:  Discontinued     2.25 g 100 mL/hr over 30 Minutes Intravenous Every 8 hours 08/18/15 2116 08/20/15 1346   08/18/15 2115  piperacillin-tazobactam (ZOSYN) IVPB  2.25 g     2.25 g 100 mL/hr over 30 Minutes Intravenous STAT 08/18/15 2112 08/18/15 2252   08/18/15 2100  piperacillin-tazobactam (ZOSYN) IVPB 3.375 g  Status:  Discontinued     3.375 g 100 mL/hr over 30 Minutes Intravenous  Once 08/18/15 2051 08/18/15 2112   08/18/15 2100  vancomycin (VANCOCIN) IVPB 1000 mg/200 mL premix     1,000 mg 200 mL/hr over 60 Minutes Intravenous  Once 08/18/15 2051 08/18/15 2304        Subjective: Briefly saw patient in room with social workers. Patient adamantly stated "I am not obligated to talk to all of you, and I want you to leave."   Objective: Filed Vitals:   08/25/15 2100 08/26/15 0430 08/26/15 0621 08/26/15 1358  BP: 116/57 113/48  103/56  Pulse: 102 98  97  Temp: 97.5 F (36.4 C) 98.2 F (36.8 C)  97.6 F (36.4 C)  TempSrc: Oral Oral  Oral  Resp: 20 12  16   Height:      Weight:   104.8 kg (231 lb 0.7 oz)   SpO2: 96% 93%  93%    Intake/Output Summary (Last 24 hours) at 08/26/15 1505 Last data filed at 08/26/15 1359  Gross per 24 hour  Intake    240 ml  Output   4450 ml  Net  -4210 ml   Filed Weights   08/24/15 0500 08/25/15 0450 08/26/15 0621  Weight: 110 kg (242 lb 8.1 oz) 107.5 kg (236 lb 15.9 oz) 104.8 kg (231 lb 0.7 oz)    Examination:  General exam: Appears calm and comfortable, laying in bed  Respiratory system: Respiratory effort normal, No audible wheezing. Cardiovascular system: S1 & S2 heard, RRR Gastrointestinal system: Abdomen Obese is nondistended, soft and nontender. No organomegaly or masses felt. Normal bowel sounds heard. Central nervous system: Alert and oriented. No focal neurological deficits. Extremities: Symmetric 5 x 5 power. Skin: No rashes, lesions Psychiatry: Judgement and insight appear normal. Mood & affect appropriate.    Data Reviewed: I have personally reviewed following labs and imaging studies  CBC:  Recent Labs Lab 08/21/15 0500 08/22/15 0458 08/23/15 0528 08/24/15 0343  08/25/15 0410 08/26/15 0411  WBC 22.2* 22.5* 19.0* 19.7* 16.7* 15.4*  NEUTROABS 18.3* 16.8* 14.3*  --   --   --   HGB 12.0 11.2* 10.5* 10.7* 10.3* 10.7*  HCT 35.4* 32.1* 31.5* 30.6* 31.0* 32.0*  MCV 81.2 79.3 80.8 79.5 83.1 82.5  PLT 229 223 216 243 256 0000000   Basic Metabolic Panel:  Recent Labs Lab 08/21/15 0500 08/22/15 0458 08/23/15 0528 08/25/15 0410 08/26/15 0657  NA 133* 136 137 139 138  K 4.4 4.0 4.2 3.7 5.3*  CL 114* 116* 116* 109 110  CO2 15* 15* 17* 22 16*  GLUCOSE 155* 135* 120* 117* 112*  BUN 43* 41* 34* 20 21*  CREATININE 1.37* 1.03* 0.82 0.85 0.80  CALCIUM 7.0* 7.4* 7.6* 7.5* 7.6*  MG 1.4* 1.8 1.6*  --   --   PHOS 3.0 2.6 2.5  --   --    GFR: Estimated Creatinine Clearance: 69.6 mL/min (by C-G formula based on Cr of 0.8). Liver Function Tests:  No results for input(s): AST, ALT, ALKPHOS, BILITOT, PROT, ALBUMIN in the last 168 hours. No results for input(s): LIPASE, AMYLASE in the last 168 hours. No results for input(s): AMMONIA in the last 168 hours. Coagulation Profile: No results for input(s): INR, PROTIME in the last 168 hours. Cardiac Enzymes:  Recent Labs Lab 08/20/15 1500  TROPONINI 0.03*   BNP (last 3 results) No results for input(s): PROBNP in the last 8760 hours. HbA1C: No results for input(s): HGBA1C in the last 72 hours. CBG:  Recent Labs Lab 08/25/15 2005 08/26/15 0003 08/26/15 0423 08/26/15 0748 08/26/15 1155  GLUCAP 101* 118* 104* 95 100*   Lipid Profile: No results for input(s): CHOL, HDL, LDLCALC, TRIG, CHOLHDL, LDLDIRECT in the last 72 hours. Thyroid Function Tests: No results for input(s): TSH, T4TOTAL, FREET4, T3FREE, THYROIDAB in the last 72 hours. Anemia Panel: No results for input(s): VITAMINB12, FOLATE, FERRITIN, TIBC, IRON, RETICCTPCT in the last 72 hours. Sepsis Labs:  Recent Labs Lab 08/21/15 0500  PROCALCITON 0.23    Recent Results (from the past 240 hour(s))  Blood Culture (routine x 2)     Status:  Abnormal   Collection Time: 08/18/15  8:50 PM  Result Value Ref Range Status   Specimen Description BLOOD LEFT ANTECUBITAL  Final   Special Requests BOTTLES DRAWN AEROBIC AND ANAEROBIC 5 ML  Final   Culture  Setup Time   Final    GRAM POSITIVE COCCI IN CLUSTERS AEROBIC BOTTLE ONLY Organism ID to follow CRITICAL RESULT CALLED TO, READ BACK BY AND VERIFIED WITH: B GREEN PHARMD [id number] 08/19/15 A BROWNING    Culture (A)  Final    STAPHYLOCOCCUS SPECIES (COAGULASE NEGATIVE) THE SIGNIFICANCE OF ISOLATING THIS ORGANISM FROM A SINGLE SET OF BLOOD CULTURES WHEN MULTIPLE SETS ARE DRAWN IS UNCERTAIN. PLEASE NOTIFY THE MICROBIOLOGY DEPARTMENT WITHIN ONE WEEK IF SPECIATION AND SENSITIVITIES ARE REQUIRED. Performed at [facility]    Report Status 08/22/2015 FINAL  Final  Blood Culture ID Panel (Reflexed)     Status: Abnormal   Collection Time: 08/18/15  8:50 PM  Result Value Ref Range Status   Enterococcus species NOT DETECTED NOT DETECTED Final   Vancomycin resistance NOT DETECTED NOT DETECTED Final   Listeria monocytogenes NOT DETECTED NOT DETECTED Final   Staphylococcus species DETECTED (A) NOT DETECTED Final    Comment: CRITICAL RESULT CALLED TO, READ BACK BY AND VERIFIED WITH: B GREEN PHARMD 2258 08/19/15 A BROWNING    Staphylococcus aureus NOT DETECTED NOT DETECTED Final   Methicillin resistance NOT DETECTED NOT DETECTED Final   Streptococcus species NOT DETECTED NOT DETECTED Final   Streptococcus agalactiae NOT DETECTED NOT DETECTED Final   Streptococcus pneumoniae NOT DETECTED NOT DETECTED Final   Streptococcus pyogenes NOT DETECTED NOT DETECTED Final   Acinetobacter baumannii NOT DETECTED NOT DETECTED Final   Enterobacteriaceae species NOT DETECTED NOT DETECTED Final   Enterobacter cloacae complex NOT DETECTED NOT DETECTED Final   Escherichia coli NOT DETECTED NOT DETECTED Final   Klebsiella oxytoca NOT DETECTED NOT DETECTED Final   Klebsiella pneumoniae NOT DETECTED NOT DETECTED  Final   Proteus species NOT DETECTED NOT DETECTED Final   Serratia marcescens NOT DETECTED NOT DETECTED Final   Carbapenem resistance NOT DETECTED NOT DETECTED Final   Haemophilus influenzae NOT DETECTED NOT DETECTED Final   Neisseria meningitidis NOT DETECTED NOT DETECTED Final   Pseudomonas aeruginosa NOT DETECTED NOT DETECTED Final   Candida albicans NOT DETECTED NOT DETECTED Final   Candida glabrata NOT DETECTED NOT DETECTED  Final   Candida krusei NOT DETECTED NOT DETECTED Final   Candida parapsilosis NOT DETECTED NOT DETECTED Final   Candida tropicalis NOT DETECTED NOT DETECTED Final    Comment: Performed at [facility]  Urine culture     Status: None   Collection Time: 08/18/15 10:04 PM  Result Value Ref Range Status   Specimen Description URINE, CATHETERIZED  Final   Special Requests NONE  Final   Culture NO GROWTH Performed at [facility]   Final   Report Status 08/20/2015 FINAL  Final  Blood Culture (routine x 2)     Status: None   Collection Time: 08/18/15 10:18 PM  Result Value Ref Range Status   Specimen Description BLOOD LEFT HAND  Final   Special Requests BOTTLES DRAWN AEROBIC AND ANAEROBIC 5CC  Final   Culture   Final    NO GROWTH 5 DAYS Performed at [facility]    Report Status 08/24/2015 FINAL  Final  MRSA PCR Screening     Status: None   Collection Time: 08/19/15  2:05 AM  Result Value Ref Range Status   MRSA by PCR NEGATIVE NEGATIVE Final    Comment:        The GeneXpert MRSA Assay (FDA approved for NASAL specimens only), is one component of a comprehensive MRSA colonization surveillance program. It is not intended to diagnose MRSA infection nor to guide or monitor treatment for MRSA infections.          Radiology Studies: Dg Abd Portable 1v  08/25/2015  CLINICAL DATA:  Constipation Hx colon polyps. Hx SBO. Hx hiatal hernia. Hx breast cancer. Hx gallbladder surgery. EXAM: PORTABLE ABDOMEN - 1 VIEW COMPARISON:  CT,  08/18/2015 FINDINGS: Normal bowel gas pattern with no evidence of obstruction or generalized adynamic ileus. No significant increase in colonic stool. No free air. Soft tissues are unremarkable. IMPRESSION: 1. No acute findings. No evidence of bowel obstruction or generalized adynamic ileus. No significant increase in colonic stool. Electronically Signed   By: Lajean Manes M.D.   On: 08/25/2015 12:37        Scheduled Meds: . amitriptyline  100 mg Oral QHS  . aspirin EC  81 mg Oral q morning - 10a  . atorvastatin  80 mg Oral Daily  . furosemide  40 mg Intravenous BID  . heparin  5,000 Units Subcutaneous Q8H  . insulin aspart  1-3 Units Subcutaneous Q4H  . levofloxacin  750 mg Oral Q24H  . LORazepam  0.5 mg Oral BID  . pantoprazole  40 mg Oral Daily  . senna-docusate  2 tablet Oral BID   Continuous Infusions:     LOS: 7 days     Lorrin Bodner, Orpah Melter, MD [facility] Pager: [phone]  If 7PM-7AM, please contact night-coverage [email or web address] Password [email or web address] 08/26/2015, 3:05 PM

## 2015-08-26 NOTE — Progress Notes (Signed)
Pharmacy Antibiotic Note  [name] is a 80 y.o. female admitted on 08/18/2015 with sepsis.  Cx data is negative, but CXR cannot exclude PNA.  Antibiotics are being narrowed to Levaquin per pharmacy.   08/26/2015:  Renal function improved, NCrCl ~ 71ml/min  WBC elevated  Plan:  Levaquin 750mg  po q24h  Follow up renal fxn, culture results, and clinical course.  Height: 5\' 5"  (165.1 cm) Weight: 231 lb 0.7 oz (104.8 kg) IBW/kg (Calculated) : 57  Temp (24hrs), Avg:97.9 F (36.6 C), Min:97.5 F (36.4 C), Max:98.2 F (36.8 C)   Recent Labs Lab 08/19/15 1458  08/21/15 0500 08/22/15 0458 08/23/15 0528 08/24/15 0343 08/25/15 0410 08/26/15 0411 08/26/15 0657  WBC  --   < > 22.2* 22.5* 19.0* 19.7* 16.7* 15.4*  --   CREATININE  --   < > 1.37* 1.03* 0.82  --  0.85  --  0.80  LATICACIDVEN 1.9  --   --   --   --   --   --   --   --   < > = values in this interval not displayed.  Estimated Creatinine Clearance: 69.6 mL/min (by C-G formula based on Cr of 0.8).    No Known Allergies  Antimicrobials this admission: 7/3 Zosyn >> 7/6 7/3 Vancomycin >>7/7 7/7 Levaquin (po 7/10) >>  Dose adjustments this admission: 7/5 SCr improved 7/4 pm so Zosyn dose adjusted.  Unable to get labs today, so conservatively, left Vanc dosing as initially ordered. 7/6 SCr improved, vanc adjusted from 1g q48h to 1250 mg q24h  Microbiology results: 7/3 BCx: 1/2 Staph species, reincubating.  BCID: Staph species (Oxacillin sens CoNS, contaminant) 7/3 UCx: NGF  7/4 MRSA PCR: neg  Thank you for allowing pharmacy to be a part of this patient's care.

## 2015-08-26 NOTE — Consult Note (Signed)
[facility] Face-to-Face Psychiatry Consult   Reason for Consult:  Capacity evaluation Referring Physician:  Dr. Rhona Leavens Patient Identification: [name] MRN:  [id number] Principal Diagnosis: <principal problem not specified> Diagnosis:   Patient Active Problem List   Diagnosis Date Noted  . Arterial hypotension [I95.9]   . Sepsis (HCC) [A41.9]   . Hypotension [I95.9] 08/19/2015  . Cerebrovascular disease [I67.9] 06/02/2015  . Abdominal pain [R10.9] 06/17/2014  . Bruit [R09.89] 06/07/2014  . Onychomycosis [B35.1] 05/21/2014  . Adnexal mass [N94.9] 01/17/2014  . Ovarian cancer screening [Z12.73] 11/12/2013  . Port catheter in place [facility] 09/05/2013  . Elevated liver function tests [R79.89] 08/08/2013  . History of B-cell lymphoma [Z85.72] 05/10/2013  . Delusional disorder (HCC) [F22] 02/15/2012  . EDEMA [R60.9] 10/23/2009  . Coronary atherosclerosis [I25.10] 08/11/2009  . ANXIETY [F41.1] 07/29/2008  . OSTEOARTHRITIS [M19.90] 07/29/2008  . DYSPNEA [R06.02] 10/24/2007  . Hyperlipidemia, mixed [E78.2] 09/22/2007  . Essential hypertension [I10] 09/22/2007  . ALLERGIC RHINITIS [J30.9] 09/22/2007  . Insomnia with sleep apnea [G47.00, G47.30] 09/22/2007  . 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 - [Z85.3] 09/22/2007    Total Time spent with patient: 1 hour  Subjective:   Alyanah J Bierlein is a 80 y.o. female patient admitted with Altered mental status.  HPI:  [name] is a 80 years old female seen, chart reviewed and case discussed with the staff RN under physician for face-to-face psychiatric consultation and evaluation of capacity to make her own medical decisions. Patient was unable to cooperate with this evaluation, patient stated that she's not feeling well, she has abdominal pain and also reported she was not required to talk and refusing to talk and asking to find somebody else to talk. Patient was unable to answer simple questions regarding  orientation, memory, language functions under has limited to no insight and judgment into her medical condition and treatment needs. Based on my evaluation patient does not meet criteria for capacity to make her own medical decisions or living arrangements.  Medical history: 80 year old female with PMH as below, which includes OSA on nocturnal O2(followed by [facility]), HTN, Breast Ca, large cell non-Hodgkins lymphoma s/p chemotherapy 2015 (6 cycles Rituxan/bendamustine). She follows with Dr. Myna Hidalgo who describes her performance status as poor. Recent PET scan has shown some activity in upper abdominal and and bilateral hilar lymph nodes. She has been asymptomatic, but also had some endometrial thickening. She was referred to gynecology and had endometrial biopsy which was negative. She was also scheduled for [facility] 7/11.  However, before this could be done she presented to the emergency department 7/3. She was at home where she felt as though she may have had the flu last week, but had been feeling better. Her home health aide was supposed to come 7/3, but did not, and for this reason she did not eat/drink much and had difficulty making it to the bathroom. She was incontinent of stool and for that reason called EMS. She wasted EMS help sitting up on couch. While moving her they got some stool on her chair (which is honestly her chief complaint at the time of my evaluation, asking me for recommendations of cleaners and if I have any experience with [facility]). In the emergency department she was noted to have low grade fever and WBC elevation to 26.2. Lactic acid and Creatinine also mildly elevated. She remained hypotensive as well, despite 2L IVF resuscitation. PCCM asked to evaluate.  PAST MEDICAL HISTORY :  She  has a past medical history of Hyperlipidemia; Chronic headache; Osteoarthritis; Chronic chest pain; Hypertension; Low back pain; GERD (gastroesophageal reflux disease); History of colon polyps;  Depression; Dyspnea; Anxiety; History of panic attacks; Hiatal hernia; History of small bowel obstruction; History of colon polyps; History of breast cancer (oncologist- dr Marin Olp-- no recurrence); Bilateral carotid artery stenosis; PMB (postmenopausal bleeding); Thickened endometrium; CAD (coronary artery disease); OSA (obstructive sleep apnea); Diffuse large cell non-Hodgkin's lymphoma [facility]) (oncologist- dr Marin Olp- per last office note being monitored for possible recurrence); and S/P drug eluting coronary stent placement.  PAST SURGICAL HISTORY: She  has past surgical history that includes Tonsillectomy; Inner ear surgery; Submandibular gland excision (Left, 05/10/2013); Portacath placement (06/04/13); Mastectomy with axillary lymph node dissection (Right, 01-03-2001); Cholecystectomy (2005); Cataract extraction w/ intraocular lens implant, bilateral (2011); Knee arthroscopy (Right); Coronary angioplasty with stent (07-09-2009 dr Darnell Level brodie); Cardiac catheterization (07-15-2010 dr Kathlyn Sacramento); transthoracic echocardiogram (10-04-2006); Cardiovascular stress test (12-24-2009); and Colonoscopy with esophagogastroduodenoscopy (egd) (11-12-2014).  No Known Allergies  No current facility-administered medications on file prior to encounter         Past Psychiatric History: Patient denied past history of mental illness and substance abuse.  Risk to Self: Is patient at risk for suicide?: No Risk to Others:   Prior Inpatient Therapy:   Prior Outpatient Therapy:    Past Medical History:  Past Medical History  Diagnosis Date  . Hyperlipidemia     takes Atorvastatin daily  . Chronic headache   . Osteoarthritis   . Chronic chest pain   . Hypertension     takes Quinapril daily  . Low back pain   . GERD (gastroesophageal reflux disease)     takes Nexium daily  . History of colon polyps     hyperplastic polyp's  2003;  2006;  2011  . Depression     takes Elavil nightly  .  Dyspnea   . Anxiety     takes Ativan daily  . History of panic attacks   . Hiatal hernia   . History of small bowel obstruction     08/ 2014  parital sbo  resolved without surgical intervention  . History of colon polyps     hyperplastic 2003,  2006,  2011  . History of breast cancer oncologist-  dr Marin Olp--  no recurrence    dx 2002-- right breast Stage 1/2 lobular,  ER+/HER2 negative /  s/p  right mastectomy w/ node dissection and chemotherapy  . Bilateral carotid artery stenosis     per last duplex --  RICA and LICA  31-59-%  . PMB (postmenopausal bleeding)   . Thickened endometrium   . CAD (coronary artery disease)     cardiologist-  dr Stanford Breed  . OSA (obstructive sleep apnea)     mild per study 10-14-2007-  cpap intolerent, uses O2 supplement at night  . Diffuse large cell non-Hodgkin's lymphoma [facility]) oncologist-  dr Marin Olp-  per last office note being monitored for possible recurrence    dx 03/ 2015 by lymph node bx/  Stage 3-  completed chemotherapy  . S/P drug eluting coronary stent placement     pLAD 07-09-2009    Past Surgical History  Procedure Laterality Date  . Tonsillectomy    . Inner ear surgery    . Submandibular gland excision Left 05/10/2013    Procedure: EXCISION LEFT SUBMANDIBULAR NODE;  Surgeon: Jodi Marble, MD;  Location: [place];  Service: ENT;  Laterality: Left;  . Portacath placement  06/04/13    and removal of previous pac  . Mastectomy with axillary lymph node dissection Right 01-03-2001  . Cholecystectomy  2005  . Cataract extraction w/ intraocular lens  implant, bilateral  2011  . Knee arthroscopy Right   . Coronary angioplasty with stent placement  07-09-2009  dr Darnell Level brodie    PCI and DES x1 to  pLAD/  normal LVF, ef 60%  . Cardiac catheterization  07-15-2010   dr Kathlyn Sacramento    no evidence obstructive cad/  patent LAD stent with no significant restenosis/  normal LVF  . Transthoracic echocardiogram  10-04-2006    ef 55-605/  trivial MR  and TR/  mild AV sclerosis without stenosis (valve area 1.93cm^2)  . Cardiovascular stress test  12-24-2009    normal nuclear study w/ no ischemia/  normal LV function and wall motion, ef 72%  . Colonoscopy with esophagogastroduodenoscopy (egd)  11-12-2014    polypectomy /  normal egd   Family History: History reviewed. No pertinent family history. Family Psychiatric  History: Patient was unable to provide information about family psychiatric history. Social History:  History  Alcohol Use No     History  Drug Use No    Social History   Social History  . Marital Status: Divorced    Spouse Name: N/A  . Number of Children: 2  . Years of Education: N/A   Occupational History  . Unemployed    Social History Main Topics  . Smoking status: Never Smoker   . Smokeless tobacco: Never Used  . Alcohol Use: No  . Drug Use: No  . Sexual Activity: Yes    Birth Control/ Protection: Post-menopausal   Other Topics Concern  . None   Social History Narrative   Additional Social History:    Allergies:  No Known Allergies  Labs:  Results for orders placed or performed during the hospital encounter of 08/18/15 (from the past 48 hour(s))  Glucose, capillary     Status: Abnormal   Collection Time: 08/24/15  4:20 PM  Result Value Ref Range   Glucose-Capillary 127 (H) 65 - 99 mg/dL  Glucose, capillary     Status: Abnormal   Collection Time: 08/24/15  8:09 PM  Result Value Ref Range   Glucose-Capillary 129 (H) 65 - 99 mg/dL  Glucose, capillary     Status: Abnormal   Collection Time: 08/25/15  1:10 AM  Result Value Ref Range   Glucose-Capillary 127 (H) 65 - 99 mg/dL  Basic metabolic panel     Status: Abnormal   Collection Time: 08/25/15  4:10 AM  Result Value Ref Range   Sodium 139 135 - 145 mmol/L   Potassium 3.7 3.5 - 5.1 mmol/L   Chloride 109 101 - 111 mmol/L   CO2 22 22 - 32 mmol/L   Glucose, Bld 117 (H) 65 - 99 mg/dL   BUN 20 6 - 20 mg/dL   Creatinine, Ser 0.85 0.44 - 1.00  mg/dL   Calcium 7.5 (L) 8.9 - 10.3 mg/dL   GFR calc non Af Amer >60 >60 mL/min   GFR calc Af Amer >60 >60 mL/min    Comment: (NOTE) The eGFR has been calculated using the CKD EPI equation. This calculation has not been validated in all clinical situations. eGFR's persistently <60 mL/min signify possible Chronic Kidney Disease.    Anion gap 8 5 - 15  CBC     Status: Abnormal   Collection Time: 08/25/15  4:10 AM  Result Value  Ref Range   WBC 16.7 (H) 4.0 - 10.5 K/uL   RBC 3.73 (L) 3.87 - 5.11 MIL/uL   Hemoglobin 10.3 (L) 12.0 - 15.0 g/dL   HCT 31.0 (L) 36.0 - 46.0 %   MCV 83.1 78.0 - 100.0 fL   MCH 27.6 26.0 - 34.0 pg   MCHC 33.2 30.0 - 36.0 g/dL   RDW 19.0 (H) 11.5 - 15.5 %   Platelets 256 150 - 400 K/uL  Glucose, capillary     Status: Abnormal   Collection Time: 08/25/15  5:05 AM  Result Value Ref Range   Glucose-Capillary 116 (H) 65 - 99 mg/dL  Glucose, capillary     Status: Abnormal   Collection Time: 08/25/15  7:47 AM  Result Value Ref Range   Glucose-Capillary 144 (H) 65 - 99 mg/dL  Glucose, capillary     Status: Abnormal   Collection Time: 08/25/15 12:51 PM  Result Value Ref Range   Glucose-Capillary 133 (H) 65 - 99 mg/dL  Glucose, capillary     Status: Abnormal   Collection Time: 08/25/15  4:30 PM  Result Value Ref Range   Glucose-Capillary 127 (H) 65 - 99 mg/dL  Glucose, capillary     Status: Abnormal   Collection Time: 08/25/15  8:05 PM  Result Value Ref Range   Glucose-Capillary 101 (H) 65 - 99 mg/dL  Glucose, capillary     Status: Abnormal   Collection Time: 08/26/15 12:03 AM  Result Value Ref Range   Glucose-Capillary 118 (H) 65 - 99 mg/dL  CBC     Status: Abnormal   Collection Time: 08/26/15  4:11 AM  Result Value Ref Range   WBC 15.4 (H) 4.0 - 10.5 K/uL   RBC 3.88 3.87 - 5.11 MIL/uL   Hemoglobin 10.7 (L) 12.0 - 15.0 g/dL   HCT 32.0 (L) 36.0 - 46.0 %   MCV 82.5 78.0 - 100.0 fL   MCH 27.6 26.0 - 34.0 pg   MCHC 33.4 30.0 - 36.0 g/dL   RDW 18.5 (H)  11.5 - 15.5 %   Platelets 258 150 - 400 K/uL  Glucose, capillary     Status: Abnormal   Collection Time: 08/26/15  4:23 AM  Result Value Ref Range   Glucose-Capillary 104 (H) 65 - 99 mg/dL  Basic metabolic panel     Status: Abnormal   Collection Time: 08/26/15  6:57 AM  Result Value Ref Range   Sodium 138 135 - 145 mmol/L   Potassium 5.3 (H) 3.5 - 5.1 mmol/L    Comment: DELTA CHECK NOTED QUANTITY NOT SUFFICIENT TO REPEAT TEST SLIGHT HEMOLYSIS    Chloride 110 101 - 111 mmol/L   CO2 16 (L) 22 - 32 mmol/L   Glucose, Bld 112 (H) 65 - 99 mg/dL   BUN 21 (H) 6 - 20 mg/dL   Creatinine, Ser 0.80 0.44 - 1.00 mg/dL   Calcium 7.6 (L) 8.9 - 10.3 mg/dL   GFR calc non Af Amer >60 >60 mL/min   GFR calc Af Amer >60 >60 mL/min    Comment: (NOTE) The eGFR has been calculated using the CKD EPI equation. This calculation has not been validated in all clinical situations. eGFR's persistently <60 mL/min signify possible Chronic Kidney Disease.    Anion gap 12 5 - 15  Glucose, capillary     Status: None   Collection Time: 08/26/15  7:48 AM  Result Value Ref Range   Glucose-Capillary 95 65 - 99 mg/dL  Glucose, capillary  Status: Abnormal   Collection Time: 08/26/15 11:55 AM  Result Value Ref Range   Glucose-Capillary 100 (H) 65 - 99 mg/dL   *Note: Due to a large number of results and/or encounters for the requested time period, some results have not been displayed. A complete set of results can be found in Results Review.    Current Facility-Administered Medications  Medication Dose Route Frequency Provider Last Rate Last Dose  . 0.9 %  sodium chloride infusion  250 mL Intravenous PRN Corey Harold, NP      . amitriptyline (ELAVIL) tablet 100 mg  100 mg Oral QHS Juanito Doom, MD   100 mg at 08/25/15 2131  . aspirin EC tablet 81 mg  81 mg Oral q morning - 10a Corey Harold, NP   81 mg at 08/26/15 1105  . atorvastatin (LIPITOR) tablet 80 mg  80 mg Oral Daily Corey Harold, NP   80 mg  at 08/25/15 1704  . bismuth subsalicylate (PEPTO BISMOL) 262 MG/15ML suspension 30 mL  30 mL Oral Q4H PRN Donne Hazel, MD      . furosemide (LASIX) injection 40 mg  40 mg Intravenous BID Donne Hazel, MD   40 mg at 08/26/15 0805  . heparin injection 5,000 Units  5,000 Units Subcutaneous Q8H Corey Harold, NP   5,000 Units at 08/26/15 0525  . insulin aspart (novoLOG) injection 1-3 Units  1-3 Units Subcutaneous Q4H Brand Males, MD   1 Units at 08/25/15 1714  . levofloxacin (LEVAQUIN) tablet 750 mg  750 mg Oral Q24H Donne Hazel, MD   750 mg at 08/25/15 2013  . LORazepam (ATIVAN) tablet 0.5 mg  0.5 mg Oral BID Donita Brooks, NP   0.5 mg at 08/26/15 1105  . pantoprazole (PROTONIX) EC tablet 40 mg  40 mg Oral Daily Corey Harold, NP   40 mg at 08/26/15 1105  . polyethylene glycol (MIRALAX / GLYCOLAX) packet 17 g  17 g Oral BID PRN Corey Harold, NP   17 g at 08/26/15 0805  . senna-docusate (Senokot-S) tablet 2 tablet  2 tablet Oral BID Brand Males, MD   2 tablet at 08/26/15 1104  . traMADol (ULTRAM) tablet 25 mg  25 mg Oral Q12H PRN Donita Brooks, NP   25 mg at 08/26/15 0805    Musculoskeletal: Strength & Muscle Tone: decreased Gait & Station: unable to stand Patient leans: N/A  Psychiatric Specialty Exam: Physical Exam as per history and physical   ROS not feeling well, feeling physically sick, denied shortness of breath and chest pain  No Fever-chills, No Headache, No changes with Vision or hearing, reports vertigo No problems swallowing food or Liquids, No Chest pain, Cough or Shortness of Breath, No Abdominal pain, No Nausea or Vommitting, Bowel movements are regular, No Blood in stool or Urine, No dysuria, No new skin rashes or bruises, No new joints pains-aches,  No new weakness, tingling, numbness in any extremity, No recent weight gain or loss, No polyuria, polydypsia or polyphagia,   A full 10 point Review of Systems was done, except as stated above, all  other Review of Systems were negative.  Blood pressure 113/48, pulse 98, temperature 98.2 F (36.8 C), temperature source Oral, resp. rate 12, height '5\' 5"'$  (1.651 m), weight 104.8 kg (231 lb 0.7 oz), SpO2 93 %.Body mass index is 38.45 kg/(m^2).  General Appearance: Bizarre, Disheveled and Guarded  Eye Contact:  Minimal  Speech:  Blocked,  Slow and Slurred  Volume:  Decreased  Mood:  Anxious, Depressed and Worthless  Affect:  Constricted and Depressed  Thought Process:  Disorganized  Orientation:  Full (Time, Place, and Person)  Thought Content:  Illogical, Obsessions and Rumination  Suicidal Thoughts:  No  Homicidal Thoughts:  No  Memory:  Immediate;   Poor Recent;   Poor  Judgement:  Impaired  Insight:  Lacking  Psychomotor Activity:  Decreased  Concentration:  Concentration: Poor and Attention Span: Poor  Recall:  Poor  Fund of Knowledge:  Fair  Language:  Fair  Akathisia:  Negative  Handed:  Right  AIMS (if indicated):     Assets:  Communication Skills Desire for Improvement Financial Resources/Insurance Leisure Time Social Support Transportation  ADL's:  Impaired  Cognition:  Impaired,  Moderate  Sleep:        Treatment Plan Summary: Daily contact with patient to assess and evaluate symptoms and progress in treatment and Medication management   Based on my evaluation patient does not meet criteria for capacity to make her own medical decisions and living arrangements's secondary to significant cognitive decline.   Recommended no psychiatric medication management  Referred to the LCSW for healthcare power of attorney and out-of-home placement when medically stable.  Disposition: Patient does not meet criteria for psychiatric inpatient admission. Supportive therapy provided about ongoing stressors.  Ambrose Finland, MD 08/26/2015 1:42 PM

## 2015-08-26 NOTE — Progress Notes (Signed)
Pt is refusing to discuss discharge plans. Pt states she does not have to talk to anyone right now. Pt is refusing to go back home alone, or with family members, or to SNF. CSW will continue to update for discharge.   Kingsley Spittle, [place] Clinical Social Worker [phone]

## 2015-08-26 NOTE — Progress Notes (Signed)
Pt states that she is not going to talk with anyone and that she is not required to talk to anyone. Pt refused to say where she lives, family or where she is going. Pt refused to talk to this CM, SW, MD, or staff RN. Will continued to follow for discharged.

## 2015-08-27 ENCOUNTER — Inpatient Hospital Stay (HOSPITAL_COMMUNITY): Payer: PPO

## 2015-08-27 ENCOUNTER — Other Ambulatory Visit: Payer: PPO

## 2015-08-27 ENCOUNTER — Ambulatory Visit: Payer: PPO | Admitting: Hematology & Oncology

## 2015-08-27 DIAGNOSIS — I9589 Other hypotension: Secondary | ICD-10-CM

## 2015-08-27 DIAGNOSIS — R6521 Severe sepsis with septic shock: Secondary | ICD-10-CM

## 2015-08-27 LAB — BASIC METABOLIC PANEL
Anion gap: 9 (ref 5–15)
BUN: 19 mg/dL (ref 6–20)
CHLORIDE: 105 mmol/L (ref 101–111)
CO2: 25 mmol/L (ref 22–32)
CREATININE: 0.77 mg/dL (ref 0.44–1.00)
Calcium: 7.7 mg/dL — ABNORMAL LOW (ref 8.9–10.3)
GFR calc Af Amer: >60 mL/min (ref >60)
GFR calc non Af Amer: >60 mL/min (ref >60)
GLUCOSE: 105 mg/dL — AB (ref 65–99)
Potassium: 3.7 mmol/L (ref 3.5–5.1)
SODIUM: 139 mmol/L (ref 135–145)

## 2015-08-27 LAB — CBC
HEMATOCRIT: 32.8 % — AB (ref 36.0–46.0)
Hemoglobin: 10.9 g/dL — ABNORMAL LOW (ref 12.0–15.0)
MCH: 27.5 pg (ref 26.0–34.0)
MCHC: 33.2 g/dL (ref 30.0–36.0)
MCV: 82.6 fL (ref 78.0–100.0)
PLATELETS: 267 10*3/uL (ref 150–400)
RBC: 3.97 MIL/uL (ref 3.87–5.11)
RDW: 18.4 % — AB (ref 11.5–15.5)
WBC: 12.3 10*3/uL — ABNORMAL HIGH (ref 4.0–10.5)

## 2015-08-27 LAB — GLUCOSE, CAPILLARY
Glucose-Capillary: 84 mg/dL (ref 65–99)
Glucose-Capillary: 100 mg/dL — ABNORMAL HIGH (ref 65–99)

## 2015-08-27 MED ORDER — BISACODYL 10 MG RE SUPP
10.0000 mg | Freq: Once | RECTAL | Status: AC
  Administered 2015-08-27: 10 mg via RECTAL
  Filled 2015-08-27: qty 1

## 2015-08-27 NOTE — Evaluation (Addendum)
Physical Therapy Evaluation   Patient Details Name: [name] MRN: [id number] DOB: Jul 10, 1935 Today's Date: 08/27/2015   History of Present Illness  -80year-old with history of OSA on nocturnal O2, breast cancer, large cell non-Hodgkin's lymphoma status post chemotherapy  who presents to the emergency department 7/3/17with cold type symptoms and decreased by mouth intake. Patient was found to have a low-grade temperature with white blood count of 26.2. Lactic acid and creatinine were elevated mildly, patient was also hypotensive.   Clinical Impression  The patient was participatory in mobility to bed edge. The patient has significant knee contractures(has been in bed x 1 week). The patient presents with decreased awareness of deficits and speaks of being able to go haome and care for herself. She did state that she is weaker than she thought she would be. Pt admitted with above diagnosis. Pt currently with functional limitations due to the deficits listed below (see PT Problem List).  Pt will benefit from skilled PT to increase their independence and safety with mobility to allow discharge to the venue listed below.       Follow Up Recommendations SNF;Supervision/Assistance - 24 hour    Equipment Recommendations  None recommended by PT    Recommendations for Other Services       Precautions / Restrictions Precautions Precautions: Fall Precaution Comments: knees are contracted      Mobility  Bed Mobility Overal bed mobility: Needs Assistance;+2 for physical assistance;+ 2 for safety/equipment Bed Mobility: Supine to Sit;Sit to Supine     Supine to sit: Total assist;+2 for physical assistance;+2 for safety/equipment;HOB elevated Sit to supine: Total assist;+2 for physical assistance;+2 for safety/equipment   General bed mobility comments: assist with the legs off of tghe bed , assist with trunk into upright total assist.   Transfers                 General transfer  comment: set chair up with drop arm but patient reports that she is too weak.  Ambulation/Gait                Stairs            Wheelchair Mobility    Modified Rankin (Stroke Patients Only)       Balance                                             Pertinent Vitals/Pain Pain Assessment: Faces Faces Pain Scale: Hurts little more Pain Location: abdomen Pain Descriptors / Indicators: Contraction Pain Intervention(s): Limited activity within patient's tolerance;Monitored during session    Home Living Family/patient expects to be discharged to:: Private residence Living Arrangements: Alone Available Help at Discharge: Personal care attendant;Neighbor;Available PRN/intermittently (2 days a week for 4 hours) Type of Home: Apartment Home Access: Level entry     Home Layout: One level Home Equipment: Walker - 2 wheels;Walker - 4 wheels;Bedside commode;Wheelchair - manual      Prior Function Level of Independence: Needs assistance;Independent with assistive device(s)   Gait / Transfers Assistance Needed: transferred  independently PTA  ADL's / Homemaking Assistance Needed:  has CAPS  Comments: question how patient was able to care for herself     Hand Dominance        Extremity/Trunk Assessment   Upper Extremity Assessment: Generalized weakness           Lower Extremity  Assessment: LLE deficits/detail;RLE deficits/detail RLE Deficits / Details: knee flexion contracture at about 60 degrees. "windswept" position in bed LLE Deficits / Details: same as right  Cervical / Trunk Assessment: Kyphotic  Communication   Communication: No difficulties  Cognition Arousal/Alertness: Awake/alert Behavior During Therapy: WFL for tasks assessed/performed Overall Cognitive Status:  (patient was deemed unable to make informed consent 7/11.) Area of Impairment: Safety/judgement;Awareness               General Comments: patient able to  provide personal information about prior function. patient does not recall  not participating in  therapy for past 2 days.    General Comments      Exercises        Assessment/Plan    PT Assessment Patient needs continued PT services  PT Diagnosis Generalized weakness;Altered mental status   PT Problem List Decreased strength;Decreased range of motion;Decreased activity tolerance;Decreased mobility;Decreased knowledge of precautions;Decreased cognition;Decreased safety awareness;Decreased knowledge of use of DME;Pain  PT Treatment Interventions Functional mobility training;DME instruction;Therapeutic activities;Patient/family education;Wheelchair mobility training   PT Goals (Current goals can be found in the Care Plan section) Acute Rehab PT Goals Patient Stated Goal: to go home PT Goal Formulation: With patient Time For Goal Achievement: 09/10/15 Potential to Achieve Goals: Fair    Frequency Min 3X/week   Barriers to discharge Decreased caregiver support;Inaccessible home environment      Co-evaluation               End of Session   Activity Tolerance: Patient limited by fatigue Patient left: in bed;with call bell/phone within reach;with bed alarm set Nurse Communication: Mobility status         Time: [id number] PT Time Calculation (min) (ACUTE ONLY): 27 min   Charges:   PT Evaluation $PT Eval Moderate Complexity: 1 Procedure PT Treatments $Therapeutic Activity: 8-22 mins   PT G Codes:        Claretha Cooper 08/27/2015, 4:46 PM Tresa Endo PT [phone]

## 2015-08-27 NOTE — Progress Notes (Signed)
SATURATION QUALIFICATIONS: (This note is used to comply with regulatory documentation for home oxygen)  Patient Saturations on Room Air at Rest = 86%  92% 2L/[place] at rest  Please briefly explain why patient needs home oxygen:

## 2015-08-27 NOTE — Progress Notes (Addendum)
PT Cancellation Note  Patient Details Name: [name] MRN: [id number] DOB: 07/25/1935   Cancelled Treatment:    22 minutes was spent with patient  Who talked but declined to try to sit up Patient provided the therapist with  Her medical history leading up to the Hospitalization. Reports that she does not recall sending   Therapist away over the past 2 days. She declined to work on bed mobility . Requesting PT to return  At 4:00 and she will sit up. The patient indicates that she should be able to care for herself in spite of pointing out to her that she has not been out of bed for 1 week. Will check back after 4.    Claretha Cooper 08/27/2015, 1:51 PM Tresa Endo PT [phone]

## 2015-08-27 NOTE — Progress Notes (Signed)
Pt apologized for not talking yesterday and states that will work with PT.

## 2015-08-27 NOTE — Progress Notes (Signed)
CSW assisting with DC planning. Progress notes reviewed pt lacks capacity for decision making. RN Case Manager contacted son who has POA (according to pt's friend, Shanon Brow) awaiting return call. CSW has attempted to contact Ernest Pine from [facility] to request assistance with discharge planning. Awaiting return call. CSW spoke with PT and have recommended SNF placement. Pt is refusing placement at this time however, lacks capacity to do so. Thornell Mule contacted Surveyor, quantity of social work contacted and awaiting for call back. CSW will continue to follow this pt for discharge planning.   Kingsley Spittle, [place] Clinical Social Worker [phone]

## 2015-08-27 NOTE — Progress Notes (Addendum)
PROGRESS NOTE    [name]  [id number] DOB: Aug 08, 1935 DOA: 08/18/2015  PCP:  Melinda Crutch, MD   Brief Narrative:  80 y/o with OSA on nocturnal O2, Breast Cancer s/p mastectomy in remision, Non-Hodgkin's Lymphoma in remission being followed by Dr Marin Olp, CAD with stents, endometrial thickening presented with stool incontinence and poor PO intake. She was in circulatory shock and did not improve despite IVF. Needed pressors. Admitted to ICU service.    Subjective: No specific complaints other than abdominal pain earlier which has now resolved. No nausea, vomiting, chest pain, cough or dyspnea.   Assessment & Plan:   Active Problems: Shock- likely septic and circulatory- CAP - lactic acidosis, hypotension, leukocytosis- temporarily on pressors - poor PO intake and diarrhea on admission has resolved - CT abd/ pelvis on 7/4- increased distal stool burden suggesting constipation and liquid stool more proximal - CXR showed mild RUL infiltrate and mild bibasilar infiltrates - coag neg staph in 1 set of blood cultures - treated with Vanc and Zosyn initially until 7/7 and then with Levaquin for possible CAP - WBC count 26 >>> 12  - currently no cough but is hypoxic and requiring O2 (86% on room air) - repeat CXR    Sleep apnea - does not wear C PAP- cont O2  Endometrial thickening - D &  C planned by Dr Rosemarie Ax but patient is admitted to the hospital - she will f/u for re-scheduling of D & C  H/o breast cancer and non-Hodgkin's lymphoma - Dr Martha Clan following- recent  PET CT showed enlarged lymph node- no treatment- Dr Marin Olp plans f/u as outpt   - CT abd/ pelvis performed in ER shows lymph nodes have not changed in size   DVT prophylaxis: Heparin  Code Status: Full  Family Communication:  Disposition Plan: home Consultants:   Oncology Procedures:    Antimicrobials:  Anti-infectives    Start     Dose/Rate Route Frequency Ordered Stop   08/25/15 2000  levofloxacin  (LEVAQUIN) tablet 750 mg     750 mg Oral Every 24 hours 08/25/15 1106     08/22/15 2000  levofloxacin (LEVAQUIN) IVPB 750 mg  Status:  Discontinued     750 mg 100 mL/hr over 90 Minutes Intravenous Every 24 hours 08/22/15 1808 08/25/15 1106   08/21/15 2200  vancomycin (VANCOCIN) 1,250 mg in sodium chloride 0.9 % 250 mL IVPB  Status:  Discontinued     1,250 mg 166.7 mL/hr over 90 Minutes Intravenous Every 24 hours 08/21/15 0808 08/22/15 1758   08/20/15 2100  vancomycin (VANCOCIN) IVPB 1000 mg/200 mL premix  Status:  Discontinued     1,000 mg 200 mL/hr over 60 Minutes Intravenous Every 48 hours 08/18/15 2114 08/21/15 0808   08/20/15 1800  piperacillin-tazobactam (ZOSYN) IVPB 3.375 g  Status:  Discontinued     3.375 g 12.5 mL/hr over 240 Minutes Intravenous Every 8 hours 08/20/15 1346 08/21/15 1201   08/19/15 0500  piperacillin-tazobactam (ZOSYN) IVPB 2.25 g  Status:  Discontinued     2.25 g 100 mL/hr over 30 Minutes Intravenous Every 8 hours 08/18/15 2116 08/20/15 1346   08/18/15 2115  piperacillin-tazobactam (ZOSYN) IVPB 2.25 g     2.25 g 100 mL/hr over 30 Minutes Intravenous STAT 08/18/15 2112 08/18/15 2252   08/18/15 2100  piperacillin-tazobactam (ZOSYN) IVPB 3.375 g  Status:  Discontinued     3.375 g 100 mL/hr over 30 Minutes Intravenous  Once 08/18/15 2051 08/18/15 2112   08/18/15 2100  vancomycin (VANCOCIN) IVPB 1000 mg/200 mL premix     1,000 mg 200 mL/hr over 60 Minutes Intravenous  Once 08/18/15 2051 08/18/15 2304       Objective: Filed Vitals:   08/27/15 0506 08/27/15 1200 08/27/15 1204 08/27/15 1439  BP:    106/58  Pulse:    95  Temp:    98.2 F (36.8 C)  TempSrc:    Oral  Resp:    18  Height:      Weight: 103.4 kg (227 lb 15.3 oz)     SpO2:  86% 92% 96%    Intake/Output Summary (Last 24 hours) at 08/27/15 1621 Last data filed at 08/27/15 1440  Gross per 24 hour  Intake    120 ml  Output   1450 ml  Net  -1330 ml   Filed Weights   08/25/15 0450 08/26/15 0621  08/27/15 0506  Weight: 107.5 kg (236 lb 15.9 oz) 104.8 kg (231 lb 0.7 oz) 103.4 kg (227 lb 15.3 oz)    Examination: General exam: Appears comfortable  HEENT: PERRLA, oral mucosa moist, no sclera icterus or thrush Respiratory system: Clear to auscultation. Respiratory effort normal. Cardiovascular system: S1 & S2 heard, RRR.  No murmurs  Gastrointestinal system: Abdomen soft, non-tender, nondistended. Normal bowel sound. No organomegaly Central nervous system: Alert and oriented. No focal neurological deficits. Extremities: No cyanosis, clubbing or edema Skin: No rashes or ulcers Psychiatry:  Mood & affect appropriate.     Data Reviewed: I have personally reviewed following labs and imaging studies  CBC:  Recent Labs Lab 08/21/15 0500 08/22/15 0458 08/23/15 0528 08/24/15 0343 08/25/15 0410 08/26/15 0411 08/27/15 0400  WBC 22.2* 22.5* 19.0* 19.7* 16.7* 15.4* 12.3*  NEUTROABS 18.3* 16.8* 14.3*  --   --   --   --   HGB 12.0 11.2* 10.5* 10.7* 10.3* 10.7* 10.9*  HCT 35.4* 32.1* 31.5* 30.6* 31.0* 32.0* 32.8*  MCV 81.2 79.3 80.8 79.5 83.1 82.5 82.6  PLT 229 223 216 243 256 258 [id number]   Basic Metabolic Panel:  Recent Labs Lab 08/21/15 0500 08/22/15 0458 08/23/15 0528 08/25/15 0410 08/26/15 0657 08/27/15 0400  NA 133* 136 137 139 138 139  K 4.4 4.0 4.2 3.7 5.3* 3.7  CL 114* 116* 116* 109 110 105  CO2 15* 15* 17* 22 16* 25  GLUCOSE 155* 135* 120* 117* 112* 105*  BUN 43* 41* 34* 20 21* 19  CREATININE 1.37* 1.03* 0.82 0.85 0.80 0.77  CALCIUM 7.0* 7.4* 7.6* 7.5* 7.6* 7.7*  MG 1.4* 1.8 1.6*  --   --   --   PHOS 3.0 2.6 2.5  --   --   --    GFR: Estimated Creatinine Clearance: 69.2 mL/min (by C-G formula based on Cr of 0.77). Liver Function Tests: No results for input(s): AST, ALT, ALKPHOS, BILITOT, PROT, ALBUMIN in the last 168 hours. No results for input(s): LIPASE, AMYLASE in the last 168 hours. No results for input(s): AMMONIA in the last 168 hours. Coagulation  Profile: No results for input(s): INR, PROTIME in the last 168 hours. Cardiac Enzymes: No results for input(s): CKTOTAL, CKMB, CKMBINDEX, TROPONINI in the last 168 hours. BNP (last 3 results) No results for input(s): PROBNP in the last 8760 hours. HbA1C: No results for input(s): HGBA1C in the last 72 hours. CBG:  Recent Labs Lab 08/26/15 1711 08/26/15 2007 08/26/15 2333 08/27/15 0417 08/27/15 0748  GLUCAP 101* 145* 122* 84 100*   Lipid Profile: No results for input(s): CHOL, HDL,  LDLCALC, TRIG, CHOLHDL, LDLDIRECT in the last 72 hours. Thyroid Function Tests: No results for input(s): TSH, T4TOTAL, FREET4, T3FREE, THYROIDAB in the last 72 hours. Anemia Panel: No results for input(s): VITAMINB12, FOLATE, FERRITIN, TIBC, IRON, RETICCTPCT in the last 72 hours. Urine analysis:    Component Value Date/Time   COLORURINE AMBER* 08/18/2015 2204   APPEARANCEUR CLOUDY* 08/18/2015 2204   LABSPEC 1.019 08/18/2015 2204   PHURINE 5.0 08/18/2015 2204   GLUCOSEU NEGATIVE 08/18/2015 2204   HGBUR NEGATIVE 08/18/2015 2204   BILIRUBINUR SMALL* 08/18/2015 2204   KETONESUR NEGATIVE 08/18/2015 2204   PROTEINUR NEGATIVE 08/18/2015 2204   UROBILINOGEN 0.2 09/19/2012 1438   NITRITE NEGATIVE 08/18/2015 2204   LEUKOCYTESUR NEGATIVE 08/18/2015 2204   Sepsis Labs: @LABRCNTIP (procalcitonin:4,lacticidven:4) ) Recent Results (from the past 240 hour(s))  Blood Culture (routine x 2)     Status: Abnormal   Collection Time: 08/18/15  8:50 PM  Result Value Ref Range Status   Specimen Description BLOOD LEFT ANTECUBITAL  Final   Special Requests BOTTLES DRAWN AEROBIC AND ANAEROBIC 5 ML  Final   Culture  Setup Time   Final    GRAM POSITIVE COCCI IN CLUSTERS AEROBIC BOTTLE ONLY Organism ID to follow CRITICAL RESULT CALLED TO, READ BACK BY AND VERIFIED WITH: B GREEN PHARMD 2258 08/19/15 A BROWNING    Culture (A)  Final    STAPHYLOCOCCUS SPECIES (COAGULASE NEGATIVE) THE SIGNIFICANCE OF ISOLATING THIS  ORGANISM FROM A SINGLE SET OF BLOOD CULTURES WHEN MULTIPLE SETS ARE DRAWN IS UNCERTAIN. PLEASE NOTIFY THE MICROBIOLOGY DEPARTMENT WITHIN ONE WEEK IF SPECIATION AND SENSITIVITIES ARE REQUIRED. Performed at [facility]    Report Status 08/22/2015 FINAL  Final  Blood Culture ID Panel (Reflexed)     Status: Abnormal   Collection Time: 08/18/15  8:50 PM  Result Value Ref Range Status   Enterococcus species NOT DETECTED NOT DETECTED Final   Vancomycin resistance NOT DETECTED NOT DETECTED Final   Listeria monocytogenes NOT DETECTED NOT DETECTED Final   Staphylococcus species DETECTED (A) NOT DETECTED Final    Comment: CRITICAL RESULT CALLED TO, READ BACK BY AND VERIFIED WITH: B GREEN PHARMD 2258 08/19/15 A BROWNING    Staphylococcus aureus NOT DETECTED NOT DETECTED Final   Methicillin resistance NOT DETECTED NOT DETECTED Final   Streptococcus species NOT DETECTED NOT DETECTED Final   Streptococcus agalactiae NOT DETECTED NOT DETECTED Final   Streptococcus pneumoniae NOT DETECTED NOT DETECTED Final   Streptococcus pyogenes NOT DETECTED NOT DETECTED Final   Acinetobacter baumannii NOT DETECTED NOT DETECTED Final   Enterobacteriaceae species NOT DETECTED NOT DETECTED Final   Enterobacter cloacae complex NOT DETECTED NOT DETECTED Final   Escherichia coli NOT DETECTED NOT DETECTED Final   Klebsiella oxytoca NOT DETECTED NOT DETECTED Final   Klebsiella pneumoniae NOT DETECTED NOT DETECTED Final   Proteus species NOT DETECTED NOT DETECTED Final   Serratia marcescens NOT DETECTED NOT DETECTED Final   Carbapenem resistance NOT DETECTED NOT DETECTED Final   Haemophilus influenzae NOT DETECTED NOT DETECTED Final   Neisseria meningitidis NOT DETECTED NOT DETECTED Final   Pseudomonas aeruginosa NOT DETECTED NOT DETECTED Final   Candida albicans NOT DETECTED NOT DETECTED Final   Candida glabrata NOT DETECTED NOT DETECTED Final   Candida krusei NOT DETECTED NOT DETECTED Final   Candida  parapsilosis NOT DETECTED NOT DETECTED Final   Candida tropicalis NOT DETECTED NOT DETECTED Final    Comment: Performed at [facility]  Urine culture     Status: None  Collection Time: 08/18/15 10:04 PM  Result Value Ref Range Status   Specimen Description URINE, CATHETERIZED  Final   Special Requests NONE  Final   Culture NO GROWTH Performed at [facility]   Final   Report Status 08/20/2015 FINAL  Final  Blood Culture (routine x 2)     Status: None   Collection Time: 08/18/15 10:18 PM  Result Value Ref Range Status   Specimen Description BLOOD LEFT HAND  Final   Special Requests BOTTLES DRAWN AEROBIC AND ANAEROBIC 5CC  Final   Culture   Final    NO GROWTH 5 DAYS Performed at [facility]    Report Status 08/24/2015 FINAL  Final  MRSA PCR Screening     Status: None   Collection Time: 08/19/15  2:05 AM  Result Value Ref Range Status   MRSA by PCR NEGATIVE NEGATIVE Final    Comment:        The GeneXpert MRSA Assay (FDA approved for NASAL specimens only), is one component of a comprehensive MRSA colonization surveillance program. It is not intended to diagnose MRSA infection nor to guide or monitor treatment for MRSA infections.          Radiology Studies: No results found.    Scheduled Meds: . amitriptyline  100 mg Oral QHS  . aspirin EC  81 mg Oral q morning - 10a  . atorvastatin  80 mg Oral Daily  . heparin  5,000 Units Subcutaneous Q8H  . levofloxacin  750 mg Oral Q24H  . LORazepam  0.5 mg Oral BID  . pantoprazole  40 mg Oral Daily  . senna-docusate  2 tablet Oral BID   Continuous Infusions:    LOS: 8 days    Time spent in minutes: 31    [place], MD [facility] Pager: [email or web address] Password [facility] 08/27/2015, 4:21 PM

## 2015-08-27 NOTE — Progress Notes (Signed)
Pt son Deprise Novelo [phone] cell with no answer.

## 2015-08-28 ENCOUNTER — Inpatient Hospital Stay (HOSPITAL_COMMUNITY): Payer: PPO

## 2015-08-28 ENCOUNTER — Encounter (HOSPITAL_COMMUNITY): Payer: Self-pay

## 2015-08-28 DIAGNOSIS — R601 Generalized edema: Secondary | ICD-10-CM

## 2015-08-28 DIAGNOSIS — R109 Unspecified abdominal pain: Secondary | ICD-10-CM

## 2015-08-28 LAB — CBC
HEMATOCRIT: 30.5 % — AB (ref 36.0–46.0)
HEMOGLOBIN: 10.2 g/dL — AB (ref 12.0–15.0)
MCH: 27.3 pg (ref 26.0–34.0)
MCHC: 33.4 g/dL (ref 30.0–36.0)
MCV: 81.8 fL (ref 78.0–100.0)
Platelets: 268 10*3/uL (ref 150–400)
RBC: 3.73 MIL/uL — AB (ref 3.87–5.11)
RDW: 18.1 % — ABNORMAL HIGH (ref 11.5–15.5)
WBC: 13.2 10*3/uL — ABNORMAL HIGH (ref 4.0–10.5)

## 2015-08-28 MED ORDER — IOPAMIDOL (ISOVUE-300) INJECTION 61%
100.0000 mL | Freq: Once | INTRAVENOUS | Status: AC | PRN
  Administered 2015-08-28: 100 mL via INTRAVENOUS

## 2015-08-28 MED ORDER — DIATRIZOATE MEGLUMINE & SODIUM 66-10 % PO SOLN
15.0000 mL | Freq: Once | ORAL | Status: AC
  Administered 2015-08-28: 15 mL via ORAL

## 2015-08-28 MED ORDER — FUROSEMIDE 10 MG/ML IJ SOLN
40.0000 mg | Freq: Every day | INTRAMUSCULAR | Status: DC
  Administered 2015-08-28 – 2015-08-29 (×2): 40 mg via INTRAVENOUS
  Filled 2015-08-28 (×2): qty 4

## 2015-08-28 NOTE — Progress Notes (Signed)
Took patient to CT for CT of abdomen/pelvis with contrast. Pt c/o pain when flushing IV and no blood return was noted. Paged NP on call to question about the contrast. The on call NP decided it would be best to hold the scan until the ordering provider decided whether the contrast was needed. Radiology informed and will pass this on to the day shift RN as well.

## 2015-08-28 NOTE — Progress Notes (Signed)
Pt continues to c/o left upper abdominal pain rated 10 out of 10- However, patient appears to be sleeping and only c/o pain after RN calls her name multiple times to arouse her. Refuses Tramadol- requests Percocet only. MD made aware- ordered CT abd. Dulcolax supp given this shift- Only BMs appears to be mucus- No stool seen. MD aware.

## 2015-08-28 NOTE — Progress Notes (Addendum)
PROGRESS NOTE    [name]  [id number] DOB: 1935-03-16 DOA: 08/18/2015  PCP:  Melinda Crutch, MD   Brief Narrative:  80 y/o with OSA on nocturnal O2, Breast Cancer s/p mastectomy in remision, Non-Hodgkin's Lymphoma in remission being followed by Dr Marin Olp, CAD with stents, endometrial thickening presented with stool incontinence and poor PO intake. She was in circulatory shock and did not improve despite IVF. Needed pressors. Admitted to ICU service.    Subjective: Continues to have intermittent abdominal pains. Not eating much in past 3-4 days. Per RN, is only passing mucous from rectum.   Assessment & Plan:   Active Problems: Shock- likely septic and circulatory- CAP - lactic acidosis, hypotension, leukocytosis- temporarily on pressors - poor PO intake and diarrhea on admission has resolved - CT abd/ pelvis on 7/4 without IV contrast- increased distal stool burden suggesting constipation and liquid stool more proximal - CXR showed mild RUL infiltrate and mild bibasilar infiltrates - coag neg staph in 1 set of blood cultures - treated with Vanc and Zosyn initially until 7/7 and then with Levaquin for possible CAP - WBC count 26 >>> 12  - currently no cough but is hypoxic and requiring O2 (86% on room air) - repeat CXR    Nausea, poor appetite, mucous per rectum, intermittent abdominal pains occuring in various locations - obtain CT with contrast today- previous CT was without contrast  Anasarca - due to fluid resusitation - cont Lasix- check albumin tomorrow  Sleep apnea - does not wear C PAP- cont O2  Endometrial thickening - D &  C planned by Dr Rosemarie Ax but patient is admitted to the hospital - she will f/u for re-scheduling of D & C  H/o breast cancer and non-Hodgkin's lymphoma - Dr Martha Clan following- recent  PET CT showed enlarged lymph node- no treatment- Dr Marin Olp plans f/u as outpt   - CT abd/ pelvis performed in ER shows lymph nodes have not changed in  size   DVT prophylaxis: Heparin  Code Status: Full  Family Communication:  Disposition Plan: home Consultants:   Oncology Procedures:    Antimicrobials:  Anti-infectives    Start     Dose/Rate Route Frequency Ordered Stop   08/25/15 2000  levofloxacin (LEVAQUIN) tablet 750 mg     750 mg Oral Every 24 hours 08/25/15 1106     08/22/15 2000  levofloxacin (LEVAQUIN) IVPB 750 mg  Status:  Discontinued     750 mg 100 mL/hr over 90 Minutes Intravenous Every 24 hours 08/22/15 1808 08/25/15 1106   08/21/15 2200  vancomycin (VANCOCIN) 1,250 mg in sodium chloride 0.9 % 250 mL IVPB  Status:  Discontinued     1,250 mg 166.7 mL/hr over 90 Minutes Intravenous Every 24 hours 08/21/15 0808 08/22/15 1758   08/20/15 2100  vancomycin (VANCOCIN) IVPB 1000 mg/200 mL premix  Status:  Discontinued     1,000 mg 200 mL/hr over 60 Minutes Intravenous Every 48 hours 08/18/15 2114 08/21/15 0808   08/20/15 1800  piperacillin-tazobactam (ZOSYN) IVPB 3.375 g  Status:  Discontinued     3.375 g 12.5 mL/hr over 240 Minutes Intravenous Every 8 hours 08/20/15 1346 08/21/15 1201   08/19/15 0500  piperacillin-tazobactam (ZOSYN) IVPB 2.25 g  Status:  Discontinued     2.25 g 100 mL/hr over 30 Minutes Intravenous Every 8 hours 08/18/15 2116 08/20/15 1346   08/18/15 2115  piperacillin-tazobactam (ZOSYN) IVPB 2.25 g     2.25 g 100 mL/hr over 30 Minutes Intravenous  STAT 08/18/15 2112 08/18/15 2252   08/18/15 2100  piperacillin-tazobactam (ZOSYN) IVPB 3.375 g  Status:  Discontinued     3.375 g 100 mL/hr over 30 Minutes Intravenous  Once 08/18/15 2051 08/18/15 2112   08/18/15 2100  vancomycin (VANCOCIN) IVPB 1000 mg/200 mL premix     1,000 mg 200 mL/hr over 60 Minutes Intravenous  Once 08/18/15 2051 08/18/15 2304       Objective: Filed Vitals:   08/27/15 1204 08/27/15 1439 08/27/15 2033 08/28/15 0621  BP:  106/58 112/60 118/53  Pulse:  95 101 91  Temp:  98.2 F (36.8 C) 97.6 F (36.4 C) 97.7 F (36.5 C)   TempSrc:  Oral Oral Oral  Resp:  18 18 20   Height:      Weight:    102.7 kg (226 lb 6.6 oz)  SpO2: 92% 96% 91% 96%    Intake/Output Summary (Last 24 hours) at 08/28/15 1432 Last data filed at 08/28/15 1253  Gross per 24 hour  Intake    360 ml  Output    950 ml  Net   -590 ml   Filed Weights   08/26/15 0621 08/27/15 0506 08/28/15 0621  Weight: 104.8 kg (231 lb 0.7 oz) 103.4 kg (227 lb 15.3 oz) 102.7 kg (226 lb 6.6 oz)    Examination: General exam: Appears comfortable  HEENT: PERRLA, oral mucosa moist, no sclera icterus or thrush Respiratory system: Clear to auscultation. Respiratory effort normal. Cardiovascular system: S1 & S2 heard, RRR.  No murmurs  Gastrointestinal system: Abdomen soft, non-tender, nondistended. Normal bowel sound. No organomegaly Central nervous system: Alert and oriented. No focal neurological deficits. Extremities: No cyanosis, clubbing + anasarca in arms and legs Skin: No rashes or ulcers Psychiatry:  Mood & affect appropriate.     Data Reviewed: I have personally reviewed following labs and imaging studies  CBC:  Recent Labs Lab 08/22/15 0458 08/23/15 0528 08/24/15 0343 08/25/15 0410 08/26/15 0411 08/27/15 0400 08/28/15 0451  WBC 22.5* 19.0* 19.7* 16.7* 15.4* 12.3* 13.2*  NEUTROABS 16.8* 14.3*  --   --   --   --   --   HGB 11.2* 10.5* 10.7* 10.3* 10.7* 10.9* 10.2*  HCT 32.1* 31.5* 30.6* 31.0* 32.0* 32.8* 30.5*  MCV 79.3 80.8 79.5 83.1 82.5 82.6 81.8  PLT 223 216 243 256 258 267 [id number]   Basic Metabolic Panel:  Recent Labs Lab 08/22/15 0458 08/23/15 0528 08/25/15 0410 08/26/15 0657 08/27/15 0400  NA 136 137 139 138 139  K 4.0 4.2 3.7 5.3* 3.7  CL 116* 116* 109 110 105  CO2 15* 17* 22 16* 25  GLUCOSE 135* 120* 117* 112* 105*  BUN 41* 34* 20 21* 19  CREATININE 1.03* 0.82 0.85 0.80 0.77  CALCIUM 7.4* 7.6* 7.5* 7.6* 7.7*  MG 1.8 1.6*  --   --   --   PHOS 2.6 2.5  --   --   --    GFR: Estimated Creatinine Clearance: 68.9  mL/min (by C-G formula based on Cr of 0.77). Liver Function Tests: No results for input(s): AST, ALT, ALKPHOS, BILITOT, PROT, ALBUMIN in the last 168 hours. No results for input(s): LIPASE, AMYLASE in the last 168 hours. No results for input(s): AMMONIA in the last 168 hours. Coagulation Profile: No results for input(s): INR, PROTIME in the last 168 hours. Cardiac Enzymes: No results for input(s): CKTOTAL, CKMB, CKMBINDEX, TROPONINI in the last 168 hours. BNP (last 3 results) No results for input(s): PROBNP in the last 8760  hours. HbA1C: No results for input(s): HGBA1C in the last 72 hours. CBG:  Recent Labs Lab 08/26/15 1711 08/26/15 2007 08/26/15 2333 08/27/15 0417 08/27/15 0748  GLUCAP 101* 145* 122* 84 100*   Lipid Profile: No results for input(s): CHOL, HDL, LDLCALC, TRIG, CHOLHDL, LDLDIRECT in the last 72 hours. Thyroid Function Tests: No results for input(s): TSH, T4TOTAL, FREET4, T3FREE, THYROIDAB in the last 72 hours. Anemia Panel: No results for input(s): VITAMINB12, FOLATE, FERRITIN, TIBC, IRON, RETICCTPCT in the last 72 hours. Urine analysis:    Component Value Date/Time   COLORURINE AMBER* 08/18/2015 2204   APPEARANCEUR CLOUDY* 08/18/2015 2204   LABSPEC 1.019 08/18/2015 2204   PHURINE 5.0 08/18/2015 2204   GLUCOSEU NEGATIVE 08/18/2015 2204   HGBUR NEGATIVE 08/18/2015 2204   BILIRUBINUR SMALL* 08/18/2015 2204   KETONESUR NEGATIVE 08/18/2015 2204   PROTEINUR NEGATIVE 08/18/2015 2204   UROBILINOGEN 0.2 09/19/2012 1438   NITRITE NEGATIVE 08/18/2015 2204   LEUKOCYTESUR NEGATIVE 08/18/2015 2204   Sepsis Labs: @LABRCNTIP (procalcitonin:4,lacticidven:4) ) Recent Results (from the past 240 hour(s))  Blood Culture (routine x 2)     Status: Abnormal   Collection Time: 08/18/15  8:50 PM  Result Value Ref Range Status   Specimen Description BLOOD LEFT ANTECUBITAL  Final   Special Requests BOTTLES DRAWN AEROBIC AND ANAEROBIC 5 ML  Final   Culture  Setup Time    Final    GRAM POSITIVE COCCI IN CLUSTERS AEROBIC BOTTLE ONLY Organism ID to follow CRITICAL RESULT CALLED TO, READ BACK BY AND VERIFIED WITH: B GREEN PHARMD 2258 08/19/15 A BROWNING    Culture (A)  Final    STAPHYLOCOCCUS SPECIES (COAGULASE NEGATIVE) THE SIGNIFICANCE OF ISOLATING THIS ORGANISM FROM A SINGLE SET OF BLOOD CULTURES WHEN MULTIPLE SETS ARE DRAWN IS UNCERTAIN. PLEASE NOTIFY THE MICROBIOLOGY DEPARTMENT WITHIN ONE WEEK IF SPECIATION AND SENSITIVITIES ARE REQUIRED. Performed at [facility]    Report Status 08/22/2015 FINAL  Final  Blood Culture ID Panel (Reflexed)     Status: Abnormal   Collection Time: 08/18/15  8:50 PM  Result Value Ref Range Status   Enterococcus species NOT DETECTED NOT DETECTED Final   Vancomycin resistance NOT DETECTED NOT DETECTED Final   Listeria monocytogenes NOT DETECTED NOT DETECTED Final   Staphylococcus species DETECTED (A) NOT DETECTED Final    Comment: CRITICAL RESULT CALLED TO, READ BACK BY AND VERIFIED WITH: B GREEN PHARMD 2258 08/19/15 A BROWNING    Staphylococcus aureus NOT DETECTED NOT DETECTED Final   Methicillin resistance NOT DETECTED NOT DETECTED Final   Streptococcus species NOT DETECTED NOT DETECTED Final   Streptococcus agalactiae NOT DETECTED NOT DETECTED Final   Streptococcus pneumoniae NOT DETECTED NOT DETECTED Final   Streptococcus pyogenes NOT DETECTED NOT DETECTED Final   Acinetobacter baumannii NOT DETECTED NOT DETECTED Final   Enterobacteriaceae species NOT DETECTED NOT DETECTED Final   Enterobacter cloacae complex NOT DETECTED NOT DETECTED Final   Escherichia coli NOT DETECTED NOT DETECTED Final   Klebsiella oxytoca NOT DETECTED NOT DETECTED Final   Klebsiella pneumoniae NOT DETECTED NOT DETECTED Final   Proteus species NOT DETECTED NOT DETECTED Final   Serratia marcescens NOT DETECTED NOT DETECTED Final   Carbapenem resistance NOT DETECTED NOT DETECTED Final   Haemophilus influenzae NOT DETECTED NOT DETECTED Final    Neisseria meningitidis NOT DETECTED NOT DETECTED Final   Pseudomonas aeruginosa NOT DETECTED NOT DETECTED Final   Candida albicans NOT DETECTED NOT DETECTED Final   Candida glabrata NOT DETECTED NOT DETECTED Final  Candida krusei NOT DETECTED NOT DETECTED Final   Candida parapsilosis NOT DETECTED NOT DETECTED Final   Candida tropicalis NOT DETECTED NOT DETECTED Final    Comment: Performed at [facility]  Urine culture     Status: None   Collection Time: 08/18/15 10:04 PM  Result Value Ref Range Status   Specimen Description URINE, CATHETERIZED  Final   Special Requests NONE  Final   Culture NO GROWTH Performed at [facility]   Final   Report Status 08/20/2015 FINAL  Final  Blood Culture (routine x 2)     Status: None   Collection Time: 08/18/15 10:18 PM  Result Value Ref Range Status   Specimen Description BLOOD LEFT HAND  Final   Special Requests BOTTLES DRAWN AEROBIC AND ANAEROBIC 5CC  Final   Culture   Final    NO GROWTH 5 DAYS Performed at [facility]    Report Status 08/24/2015 FINAL  Final  MRSA PCR Screening     Status: None   Collection Time: 08/19/15  2:05 AM  Result Value Ref Range Status   MRSA by PCR NEGATIVE NEGATIVE Final    Comment:        The GeneXpert MRSA Assay (FDA approved for NASAL specimens only), is one component of a comprehensive MRSA colonization surveillance program. It is not intended to diagnose MRSA infection nor to guide or monitor treatment for MRSA infections.          Radiology Studies: Dg Chest Port 1 View  08/27/2015  CLINICAL DATA:  80 year old female with hypoxia. History of breast cancer and non-Hodgkin's lymphoma. EXAM: PORTABLE CHEST 1 VIEW COMPARISON:  08/22/2015 and prior radiographs FINDINGS: This is a mildly low volume film. Cardiomegaly is present. Bibasilar opacities have slightly increased - question atelectasis versus airspace disease. Mild interstitial opacities are again identified. No  large pleural effusions or pneumothorax identified. IMPRESSION: Increasing bibasilar opacities -question atelectasis versus airspace disease/pneumonia. Mild interstitial opacities again noted which may represent mild edema. Electronically Signed   By: Margarette Canada M.D.   On: 08/27/2015 17:08      Scheduled Meds: . amitriptyline  100 mg Oral QHS  . aspirin EC  81 mg Oral q morning - 10a  . atorvastatin  80 mg Oral Daily  . heparin  5,000 Units Subcutaneous Q8H  . levofloxacin  750 mg Oral Q24H  . LORazepam  0.5 mg Oral BID  . pantoprazole  40 mg Oral Daily  . senna-docusate  2 tablet Oral BID   Continuous Infusions:    LOS: 9 days    Time spent in minutes: 54    Bearden, MD [facility] Pager: [email or web address] Password [email or web address] 08/28/2015, 2:32 PM

## 2015-08-28 NOTE — Progress Notes (Signed)
A call to pt's son Della Lanasa work number [phone] was made, no answer voice mail left, asking [place] to return my call.

## 2015-08-28 NOTE — Progress Notes (Signed)
Pt is currently agreeable to SNF. Pt has a bed offer at [facility] and would like to go there. Pt states she has heard good things about their facility and does not mind going there for a short period of time. CSW will facilitate discharge once ready.   Kingsley Spittle, [place] Clinical Social Worker [phone]

## 2015-08-28 NOTE — Progress Notes (Signed)
Pt's son [name] returned my call. Son states, pt lives alone, he travels, there is no one to stay with pt. He is okay with pt going to a SNF.

## 2015-08-28 NOTE — Progress Notes (Signed)
   08/28/15 0900  Clinical Encounter Type  Visited With Other (Comment) (Social Worker and Vertis Kelch)  Visit Type Initial;Spiritual support;Social support;Other (Comment)  Referral From Social work  Consult/Referral To [employer]  Spiritual Encounters  Spiritual Needs Other (Comment);Literature Education officer, community Information )  Stress Factors  Patient Stress Factors Not reviewed  Family Stress Factors Financial concerns;Lack of knowledge;Other (Comment) (Care Concerns)   I was paged to meet with the Social Worker and the patient's friend, Mr. [name]. I learned from Social Work that the patient hadn't been talking for a day or so and that they needed to find her place, because she is unable to go back to her home.  The patient's friend stated that he has had a difficult time getting in touch with her son, who is her Durable Power of Attorney and Investment banker, operational. The patient does not have a Press photographer document.  The social worker was able to speak with the patient about placement in a skilled nursing facility. I felt that it wasn't a good idea for me to go in and speak with the patient at this time due to her being cooperative at this point with staff.  Please contact Spiritual Care for further assistance.    [place] M.Div.

## 2015-08-29 DIAGNOSIS — R590 Localized enlarged lymph nodes: Secondary | ICD-10-CM

## 2015-08-29 DIAGNOSIS — K5900 Constipation, unspecified: Secondary | ICD-10-CM

## 2015-08-29 LAB — CBC
HEMATOCRIT: 33.3 % — AB (ref 36.0–46.0)
HEMOGLOBIN: 11.1 g/dL — AB (ref 12.0–15.0)
MCH: 27.5 pg (ref 26.0–34.0)
MCHC: 33.3 g/dL (ref 30.0–36.0)
MCV: 82.6 fL (ref 78.0–100.0)
Platelets: 260 10*3/uL (ref 150–400)
RBC: 4.03 MIL/uL (ref 3.87–5.11)
RDW: 17.9 % — ABNORMAL HIGH (ref 11.5–15.5)
WBC: 11.8 10*3/uL — ABNORMAL HIGH (ref 4.0–10.5)

## 2015-08-29 LAB — COMPREHENSIVE METABOLIC PANEL
ALBUMIN: 2.3 g/dL — AB (ref 3.5–5.0)
ALK PHOS: 177 U/L — AB (ref 38–126)
ALT: 290 U/L — ABNORMAL HIGH (ref 14–54)
ANION GAP: 8 (ref 5–15)
AST: 263 U/L — ABNORMAL HIGH (ref 15–41)
BUN: 19 mg/dL (ref 6–20)
CHLORIDE: 104 mmol/L (ref 101–111)
CO2: 26 mmol/L (ref 22–32)
Calcium: 8.1 mg/dL — ABNORMAL LOW (ref 8.9–10.3)
Creatinine, Ser: 0.76 mg/dL (ref 0.44–1.00)
GFR calc Af Amer: >60 mL/min (ref >60)
GFR calc non Af Amer: >60 mL/min (ref >60)
GLUCOSE: 111 mg/dL — AB (ref 65–99)
POTASSIUM: 3.6 mmol/L (ref 3.5–5.1)
Sodium: 138 mmol/L (ref 135–145)
Total Bilirubin: 0.7 mg/dL (ref 0.3–1.2)
Total Protein: 5.3 g/dL — ABNORMAL LOW (ref 6.5–8.1)

## 2015-08-29 LAB — PROCALCITONIN: Procalcitonin: 0.24 ng/mL

## 2015-08-29 MED ORDER — SORBITOL 70 % SOLN
960.0000 mL | TOPICAL_OIL | Freq: Once | ORAL | Status: AC
  Administered 2015-08-29: 960 mL via RECTAL
  Filled 2015-08-29: qty 240

## 2015-08-29 MED ORDER — FUROSEMIDE 10 MG/ML IJ SOLN
40.0000 mg | Freq: Two times a day (BID) | INTRAMUSCULAR | Status: DC
  Administered 2015-08-29 – 2015-08-30 (×2): 40 mg via INTRAVENOUS
  Filled 2015-08-29 (×2): qty 4

## 2015-08-29 MED ORDER — MAGNESIUM CITRATE PO SOLN
1.0000 | Freq: Four times a day (QID) | ORAL | Status: AC
  Administered 2015-08-29 (×2): 1 via ORAL
  Filled 2015-08-29 (×2): qty 296

## 2015-08-29 MED ORDER — SORBITOL 70 % SOLN
960.0000 mL | TOPICAL_OIL | Freq: Once | ORAL | Status: DC
  Filled 2015-08-29: qty 240

## 2015-08-29 NOTE — Progress Notes (Addendum)
PROGRESS NOTE    [name] Sunday  [id number] DOB: 10/24/1935 DOA: 08/18/2015  PCP:  Melinda Crutch, MD   Brief Narrative:  80 y/o with OSA on nocturnal O2, Breast Cancer s/p mastectomy in remision, Non-Hodgkin's Lymphoma in remission being followed by Dr Marin Olp, CAD with stents, endometrial thickening presented with stool incontinence and poor PO intake. She was in circulatory shock and did not improve despite IVF. Needed pressors. Admitted to ICU service.    Subjective: CT shows still has significant amount of stool. She is in agreement to taking laxatives and enemas. Abdominal pain has not recurred today.   Assessment & Plan:   Active Problems: Shock- likely septic and circulatory- CAP - lactic acidosis, hypotension, leukocytosis- temporarily on pressors - poor PO intake and diarrhea on admission has resolved - CT abd/ pelvis on 7/4 without IV contrast- increased distal stool burden suggesting constipation and liquid stool more proximal - CXR showed mild RUL infiltrate and mild bibasilar infiltrates - coag neg staph in 1 set of blood cultures - treated with Vanc and Zosyn initially until 7/7 and then with Levaquin for possible CAP - WBC count 26 >>> 12  - currently no cough but is hypoxic and requiring O2 (86% on room air) - repeat CXR shows b/l infiltrates at bases- pro calcitonin negative- may be pulmonary edema- cont Lasix  Nausea, poor appetite, mucous per rectum, intermittent abdominal pains occuring in various locations Elevated LFTs H/o breast cancer and non-Hodgkin's lymphoma - Dr Martha Clan following- recent  PET CT showed enlarged lymph nodes - - enlarging lymph nodes on CT today , stranding at mesenteric root and significant amount of stool - outpt PET planned by Dr Marin Olp - laxatives/enemas  Anasarca - due to fluid resusitation - cont Lasix-   Sleep apnea - does not wear C PAP- cont O2  Endometrial thickening - D &  C planned by Dr Rosemarie Ax but patient is admitted  to the hospital - she will f/u for re-scheduling of D & C   DVT prophylaxis: Heparin  Code Status: Full  Family Communication:  Disposition Plan: home Consultants:   Oncology Procedures:    Antimicrobials:  Anti-infectives    Start     Dose/Rate Route Frequency Ordered Stop   08/25/15 2000  levofloxacin (LEVAQUIN) tablet 750 mg     750 mg Oral Every 24 hours 08/25/15 1106     08/22/15 2000  levofloxacin (LEVAQUIN) IVPB 750 mg  Status:  Discontinued     750 mg 100 mL/hr over 90 Minutes Intravenous Every 24 hours 08/22/15 1808 08/25/15 1106   08/21/15 2200  vancomycin (VANCOCIN) 1,250 mg in sodium chloride 0.9 % 250 mL IVPB  Status:  Discontinued     1,250 mg 166.7 mL/hr over 90 Minutes Intravenous Every 24 hours 08/21/15 0808 08/22/15 1758   08/20/15 2100  vancomycin (VANCOCIN) IVPB 1000 mg/200 mL premix  Status:  Discontinued     1,000 mg 200 mL/hr over 60 Minutes Intravenous Every 48 hours 08/18/15 2114 08/21/15 0808   08/20/15 1800  piperacillin-tazobactam (ZOSYN) IVPB 3.375 g  Status:  Discontinued     3.375 g 12.5 mL/hr over 240 Minutes Intravenous Every 8 hours 08/20/15 1346 08/21/15 1201   08/19/15 0500  piperacillin-tazobactam (ZOSYN) IVPB 2.25 g  Status:  Discontinued     2.25 g 100 mL/hr over 30 Minutes Intravenous Every 8 hours 08/18/15 2116 08/20/15 1346   08/18/15 2115  piperacillin-tazobactam (ZOSYN) IVPB 2.25 g     2.25 g 100 mL/hr  over 30 Minutes Intravenous STAT 08/18/15 2112 08/18/15 2252   08/18/15 2100  piperacillin-tazobactam (ZOSYN) IVPB 3.375 g  Status:  Discontinued     3.375 g 100 mL/hr over 30 Minutes Intravenous  Once 08/18/15 2051 08/18/15 2112   08/18/15 2100  vancomycin (VANCOCIN) IVPB 1000 mg/200 mL premix     1,000 mg 200 mL/hr over 60 Minutes Intravenous  Once 08/18/15 2051 08/18/15 2304       Objective: Filed Vitals:   08/28/15 2033 08/29/15 0441 08/29/15 1358 08/29/15 1400  BP: 111/55 103/53 104/47   Pulse: 99 94 103   Temp: 98.4  F (36.9 C) 97.9 F (36.6 C) 98 F (36.7 C)   TempSrc: Oral Oral Oral   Resp: 18 20 20    Height:      Weight:  100.3 kg (221 lb 1.9 oz)    SpO2: 94% 92% 88% 93%    Intake/Output Summary (Last 24 hours) at 08/29/15 1634 Last data filed at 08/28/15 2035  Gross per 24 hour  Intake    240 ml  Output    450 ml  Net   -210 ml   Filed Weights   08/27/15 0506 08/28/15 0621 08/29/15 0441  Weight: 103.4 kg (227 lb 15.3 oz) 102.7 kg (226 lb 6.6 oz) 100.3 kg (221 lb 1.9 oz)    Examination: General exam: Appears comfortable  HEENT: PERRLA, oral mucosa moist, no sclera icterus or thrush Respiratory system: Clear to auscultation. Respiratory effort normal. Cardiovascular system: S1 & S2 heard, RRR.  No murmurs  Gastrointestinal system: Abdomen soft, non-tender, nondistended. Normal bowel sound. No organomegaly Central nervous system: Alert and oriented. No focal neurological deficits. Extremities: No cyanosis, clubbing + anasarca in arms and legs Skin: No rashes or ulcers Psychiatry:  Mood & affect appropriate.     Data Reviewed: I have personally reviewed following labs and imaging studies  CBC:  Recent Labs Lab 08/23/15 0528  08/25/15 0410 08/26/15 0411 08/27/15 0400 08/28/15 0451 08/29/15 0532  WBC 19.0*  < > 16.7* 15.4* 12.3* 13.2* 11.8*  NEUTROABS 14.3*  --   --   --   --   --   --   HGB 10.5*  < > 10.3* 10.7* 10.9* 10.2* 11.1*  HCT 31.5*  < > 31.0* 32.0* 32.8* 30.5* 33.3*  MCV 80.8  < > 83.1 82.5 82.6 81.8 82.6  PLT 216  < > 256 258 267 268 260  < > = values in this interval not displayed. Basic Metabolic Panel:  Recent Labs Lab 08/23/15 0528 08/25/15 0410 08/26/15 0657 08/27/15 0400 08/29/15 0532  NA 137 139 138 139 138  K 4.2 3.7 5.3* 3.7 3.6  CL 116* 109 110 105 104  CO2 17* 22 16* 25 26  GLUCOSE 120* 117* 112* 105* 111*  BUN 34* 20 21* 19 19  CREATININE 0.82 0.85 0.80 0.77 0.76  CALCIUM 7.6* 7.5* 7.6* 7.7* 8.1*  MG 1.6*  --   --   --   --   PHOS 2.5   --   --   --   --    GFR: Estimated Creatinine Clearance: 68 mL/min (by C-G formula based on Cr of 0.76). Liver Function Tests:  Recent Labs Lab 08/29/15 0532  AST 263*  ALT 290*  ALKPHOS 177*  BILITOT 0.7  PROT 5.3*  ALBUMIN 2.3*   No results for input(s): LIPASE, AMYLASE in the last 168 hours. No results for input(s): AMMONIA in the last 168 hours. Coagulation Profile: No  results for input(s): INR, PROTIME in the last 168 hours. Cardiac Enzymes: No results for input(s): CKTOTAL, CKMB, CKMBINDEX, TROPONINI in the last 168 hours. BNP (last 3 results) No results for input(s): PROBNP in the last 8760 hours. HbA1C: No results for input(s): HGBA1C in the last 72 hours. CBG:  Recent Labs Lab 08/26/15 1711 08/26/15 2007 08/26/15 2333 08/27/15 0417 08/27/15 0748  GLUCAP 101* 145* 122* 84 100*   Lipid Profile: No results for input(s): CHOL, HDL, LDLCALC, TRIG, CHOLHDL, LDLDIRECT in the last 72 hours. Thyroid Function Tests: No results for input(s): TSH, T4TOTAL, FREET4, T3FREE, THYROIDAB in the last 72 hours. Anemia Panel: No results for input(s): VITAMINB12, FOLATE, FERRITIN, TIBC, IRON, RETICCTPCT in the last 72 hours. Urine analysis:    Component Value Date/Time   COLORURINE AMBER* 08/18/2015 2204   APPEARANCEUR CLOUDY* 08/18/2015 2204   LABSPEC 1.019 08/18/2015 2204   PHURINE 5.0 08/18/2015 2204   GLUCOSEU NEGATIVE 08/18/2015 2204   HGBUR NEGATIVE 08/18/2015 2204   BILIRUBINUR SMALL* 08/18/2015 2204   KETONESUR NEGATIVE 08/18/2015 2204   PROTEINUR NEGATIVE 08/18/2015 2204   UROBILINOGEN 0.2 09/19/2012 1438   NITRITE NEGATIVE 08/18/2015 2204   LEUKOCYTESUR NEGATIVE 08/18/2015 2204   Sepsis Labs: @LABRCNTIP (procalcitonin:4,lacticidven:4) ) No results found for this or any previous visit (from the past 240 hour(s)).       Radiology Studies: Ct Abdomen Pelvis W Contrast  08/28/2015  CLINICAL DATA:  Intermittent abdominal pain. History of breast cancer in  remission. History of non-Hodgkin's lymphoma in remission. History of CAD with stents. Stool incontinence and poor PO intake. EXAM: CT ABDOMEN AND PELVIS WITH CONTRAST TECHNIQUE: Multidetector CT imaging of the abdomen and pelvis was performed using the standard protocol following bolus administration of intravenous contrast. CONTRAST:  125mL ISOVUE-300 IOPAMIDOL (ISOVUE-300) INJECTION 61% COMPARISON:  08/18/2015 FINDINGS: Lower chest: Patchy infiltrates are identified in both lung bases. Small bilateral pleural effusions are noted. This represents a significant change since the previous exam. Hepatobiliary: Status post cholecystectomy. No focal abnormality identified within the liver. Pancreas: The pancreas has a normal appearance. Spleen: Normal in appearance. Renal/Adrenal: Adrenal glands are normal in appearance. There is symmetric enhancement and excretion from both kidneys. Small probable cysts are identified in the lower pole of the left kidney. Gastrointestinal tract: Small sliding-type hiatal hernia. Stomach otherwise is normal in appearance. Small bowel loops are normal in appearance. The appendix is well seen and has a normal appearance. There is moderate stool the mildly prominent loops of colon, not associated with obstructing mass. Reproductive: Stable appearance of endometrial thickening. Left adnexal cyst is 5.5 centimeters in diameter, stable in appearance. No evidence for right adnexal lesion. No free pelvic fluid. Bladder: Normal in appearance. Vascular/Lymphatic: There is atherosclerosis of the abdominal aorta. Interval development of hazy mesentery and increased prominence of mesenteric lymph nodes. Largest lymph node in the mesenteric root is 2.0 x 2.3 centimeters. Musculoskeletal/Abdominal wall: Increased body wall edema. Small paraumbilical fat containing hernia. Significant degenerative changes in the lower thoracic and lumbar spine. Sclerosis associated with degenerative changes. No  suspicious lytic or blastic lesions. Other: No ascites. IMPRESSION: 1. Interval development of bilateral lower lobe pulmonary infiltrates and small pleural effusions. 2. Interval development of stranding in the mesenteric root and increased prominence of mesenteric adenopathy. Considerations include lymphoproliferative disease, infection, inflammation. 3. Persistent moderate stool burden without evidence for obstruction. 4. Stable appearance of endometrial thickening. 5. Increased body wall edema. Electronically Signed   By: Nolon Nations M.D.   On: 08/28/2015 16:47  Dg Chest Port 1 View  08/27/2015  CLINICAL DATA:  80 year old female with hypoxia. History of breast cancer and non-Hodgkin's lymphoma. EXAM: PORTABLE CHEST 1 VIEW COMPARISON:  08/22/2015 and prior radiographs FINDINGS: This is a mildly low volume film. Cardiomegaly is present. Bibasilar opacities have slightly increased - question atelectasis versus airspace disease. Mild interstitial opacities are again identified. No large pleural effusions or pneumothorax identified. IMPRESSION: Increasing bibasilar opacities -question atelectasis versus airspace disease/pneumonia. Mild interstitial opacities again noted which may represent mild edema. Electronically Signed   By: Margarette Canada M.D.   On: 08/27/2015 17:08      Scheduled Meds: . amitriptyline  100 mg Oral QHS  . aspirin EC  81 mg Oral q morning - 10a  . atorvastatin  80 mg Oral Daily  . furosemide  40 mg Intravenous Daily  . heparin  5,000 Units Subcutaneous Q8H  . levofloxacin  750 mg Oral Q24H  . LORazepam  0.5 mg Oral BID  . pantoprazole  40 mg Oral Daily  . senna-docusate  2 tablet Oral BID  . sorbitol, milk of mag, mineral oil, glycerin (SMOG) enema  960 mL Rectal Once   Continuous Infusions:    LOS: 10 days    Time spent in minutes: 38    [place], MD [facility] Pager: [email or web address] Password [facility] 08/29/2015, 4:34 PM

## 2015-08-29 NOTE — Care Management Important Message (Signed)
Important Message  Patient Details  Name: Reshma Coomber Belluomini MRN: [id number] Date of Birth: 04-Sep-1935   Medicare Important Message Given:  Yes    Camillo Flaming 08/29/2015, [place] Message  Patient Details  Name: Krupa Diantonio Brazie MRN: [id number] Date of Birth: March 23, 1935   Medicare Important Message Given:  Yes    Camillo Flaming 08/29/2015, 9:18 AM

## 2015-08-29 NOTE — Progress Notes (Signed)
Physical Therapy Treatment Patient Details Name: [name] MRN: [id number] DOB: [birth year] Today's Date: 08/29/2015    History of Present Illness 80 yo adm 08/18/15 with  history of OSA on nocturnal O2, breast cancer, large cell non-Hodgkin's lymphoma status post chemotherapy  who presents to the emergency department 7/3/17with cold type symptoms and decreased by mouth intake. Patient was found to have a low-grade temperature with white blood count of 26.2. Lactic acid and creatinine were elevated mildly, patient was also hypotensive.     PT Comments    Progressing slowly; pt is cooperative this date with PT; continue to recommend SNF  Follow Up Recommendations  SNF;Supervision/Assistance - 24 hour     Equipment Recommendations  None recommended by PT    Recommendations for Other Services       Precautions / Restrictions Precautions Precautions: Fall Precaution Comments: knees are contracted Restrictions Weight Bearing Restrictions: No    Mobility  Bed Mobility Overal bed mobility: Needs Assistance;+2 for physical assistance;+ 2 for safety/equipment Bed Mobility: Supine to Sit;Sit to Supine     Supine to sit: Mod assist;+2 for physical assistance;+2 for safety/equipment Sit to supine: Max assist;+2 for physical assistance   General bed mobility comments: +2 total assist using pad to scoot along EOB (toward HOB, pt + 20%) assist with LEs and trunk in both directions; pt does initiate movement  Transfers                 General transfer comment: deferred d/t RN giving pt enema as soon as PT session finished  Ambulation/Gait                 Stairs            Wheelchair Mobility    Modified Rankin (Stroke Patients Only)       Balance                                    Cognition Arousal/Alertness: Awake/alert Behavior During Therapy: WFL for tasks assessed/performed                   General Comments: pt doe not  recall last therapy session; she follows one step commands consisitently    Exercises      General Comments        Pertinent Vitals/Pain Pain Assessment: No/denies pain    Home Living                      Prior Function            PT Goals (current goals can now be found in the care plan section) Acute Rehab PT Goals Patient Stated Goal: to go home PT Goal Formulation: With patient Time For Goal Achievement: 09/10/15 Potential to Achieve Goals: Fair Progress towards PT goals: Progressing toward goals    Frequency  Min 3X/week    PT Plan Current plan remains appropriate    Co-evaluation             End of Session   Activity Tolerance: Other (comment) (see note) Patient left: in bed;with call bell/phone within reach;with bed alarm set     Time: 1030-1054 PT Time Calculation (min) (ACUTE ONLY): 24 min  Charges:  $Therapeutic Activity: 23-37 mins                    G Codes:  [facility] 08/29/2015, 11:11 AM

## 2015-08-29 NOTE — Plan of Care (Signed)
Problem: Activity: Goal: Risk for activity intolerance will decrease Continue.   Working with PT.    Problem: Nutrition: Goal: Adequate nutrition will be maintained Outcome: Progressing Pt with poor appetite recently, however after BM pt ate 1/2 of her subway sandwich.    Problem: Bowel/Gastric: Goal: Will not experience complications related to bowel motility Outcome: Progressing Pt had moderate-large BM today following senna, mag citrate, and SMOG enema.  Will continue with current plan of care.

## 2015-08-30 DIAGNOSIS — K5901 Slow transit constipation: Secondary | ICD-10-CM

## 2015-08-30 DIAGNOSIS — G4730 Sleep apnea, unspecified: Secondary | ICD-10-CM

## 2015-08-30 DIAGNOSIS — G4700 Insomnia, unspecified: Secondary | ICD-10-CM

## 2015-08-30 LAB — CBC
HEMATOCRIT: 30.9 % — AB (ref 36.0–46.0)
Hemoglobin: 10.3 g/dL — ABNORMAL LOW (ref 12.0–15.0)
MCH: 27.5 pg (ref 26.0–34.0)
MCHC: 33.3 g/dL (ref 30.0–36.0)
MCV: 82.6 fL (ref 78.0–100.0)
Platelets: 291 10*3/uL (ref 150–400)
RBC: 3.74 MIL/uL — ABNORMAL LOW (ref 3.87–5.11)
RDW: 17.9 % — AB (ref 11.5–15.5)
WBC: 12.7 10*3/uL — ABNORMAL HIGH (ref 4.0–10.5)

## 2015-08-30 LAB — COMPREHENSIVE METABOLIC PANEL
ALBUMIN: 2.2 g/dL — AB (ref 3.5–5.0)
ALT: 252 U/L — ABNORMAL HIGH (ref 14–54)
ANION GAP: 7 (ref 5–15)
AST: 190 U/L — AB (ref 15–41)
Alkaline Phosphatase: 167 U/L — ABNORMAL HIGH (ref 38–126)
BILIRUBIN TOTAL: 0.4 mg/dL (ref 0.3–1.2)
BUN: 17 mg/dL (ref 6–20)
CHLORIDE: 101 mmol/L (ref 101–111)
CO2: 31 mmol/L (ref 22–32)
Calcium: 8.1 mg/dL — ABNORMAL LOW (ref 8.9–10.3)
Creatinine, Ser: 0.8 mg/dL (ref 0.44–1.00)
GFR calc Af Amer: >60 mL/min (ref >60)
GFR calc non Af Amer: >60 mL/min (ref >60)
GLUCOSE: 109 mg/dL — AB (ref 65–99)
POTASSIUM: 4 mmol/L (ref 3.5–5.1)
Sodium: 139 mmol/L (ref 135–145)
TOTAL PROTEIN: 5.2 g/dL — AB (ref 6.5–8.1)

## 2015-08-30 MED ORDER — SORBITOL 70 % SOLN
960.0000 mL | TOPICAL_OIL | Freq: Once | ORAL | Status: AC
  Administered 2015-08-30: 960 mL via RECTAL
  Filled 2015-08-30: qty 240

## 2015-08-30 NOTE — Progress Notes (Signed)
PROGRESS NOTE    [name]  [id number] DOB: 07/13/1935 DOA: 08/18/2015  PCP:  Melinda Crutch, MD   Brief Narrative:  80 y/o with OSA on nocturnal O2, Breast Cancer s/p mastectomy in remision, Non-Hodgkin's Lymphoma in remission being followed by Dr Marin Olp, CAD with stents, endometrial thickening presented with stool incontinence and poor PO intake. She was in circulatory shock and did not improve despite IVF. Needed pressors. Admitted to ICU service.    Subjective: 1 Bm yesterday- maybe a second one later at night- she cannot recall. No abdominal pain- eating much better today.    Assessment & Plan:   Active Problems: Shock- likely septic and circulatory- CAP - lactic acidosis, hypotension, leukocytosis- temporarily on pressors - poor PO intake and diarrhea on admission has resolved - CT abd/ pelvis on 7/4 without IV contrast- increased distal stool burden suggesting constipation and liquid stool more proximal - CXR showed mild RUL infiltrate and mild bibasilar infiltrates - coag neg staph in 1 set of blood cultures - treated with Vanc and Zosyn initially until 7/7 and then with Levaquin for possible CAP- stop Levaquin today - WBC count 26 >>> 12  - currently no cough but is hypoxic and requiring O2 (86% on room air) - repeat CXR shows b/l infiltrates at bases- no cough or dyspnea- pro calcitonin negative- may be pulmonary edema-  Lasix   Elevated LFTs. leukocytosis H/o breast cancer and non-Hodgkin's lymphoma - Dr Martha Clan following- recent  PET CT showed enlarged lymph nodes - - CT on 7/14 shows enlarging lymph nodes, stranding at mesenteric root and significant amount of stool - outpt PET planned by Dr Marin Olp  Nausea, poor appetite, mucous per rectum, intermittent abdominal pains occuring in various locations -continue laxatives/enemas- symptoms improved  Anasarca - due to fluid resusitation - cont Lasix as BP tolerates- Creatinine stable  Sleep apnea - does not wear  C PAP- cont O2  Endometrial thickening - D &  C planned by Dr Rosemarie Ax but patient is admitted to the hospital - she will f/u for re-scheduling of D & C   DVT prophylaxis: Heparin  Code Status: Full  Family Communication:  Disposition Plan: home Consultants:   Oncology Procedures:    Antimicrobials:  Anti-infectives    Start     Dose/Rate Route Frequency Ordered Stop   08/25/15 2000  levofloxacin (LEVAQUIN) tablet 750 mg  Status:  Discontinued     750 mg Oral Every 24 hours 08/25/15 1106 08/30/15 1406   08/22/15 2000  levofloxacin (LEVAQUIN) IVPB 750 mg  Status:  Discontinued     750 mg 100 mL/hr over 90 Minutes Intravenous Every 24 hours 08/22/15 1808 08/25/15 1106   08/21/15 2200  vancomycin (VANCOCIN) 1,250 mg in sodium chloride 0.9 % 250 mL IVPB  Status:  Discontinued     1,250 mg 166.7 mL/hr over 90 Minutes Intravenous Every 24 hours 08/21/15 0808 08/22/15 1758   08/20/15 2100  vancomycin (VANCOCIN) IVPB 1000 mg/200 mL premix  Status:  Discontinued     1,000 mg 200 mL/hr over 60 Minutes Intravenous Every 48 hours 08/18/15 2114 08/21/15 0808   08/20/15 1800  piperacillin-tazobactam (ZOSYN) IVPB 3.375 g  Status:  Discontinued     3.375 g 12.5 mL/hr over 240 Minutes Intravenous Every 8 hours 08/20/15 1346 08/21/15 1201   08/19/15 0500  piperacillin-tazobactam (ZOSYN) IVPB 2.25 g  Status:  Discontinued     2.25 g 100 mL/hr over 30 Minutes Intravenous Every 8 hours 08/18/15 2116 08/20/15 1346  08/18/15 2115  piperacillin-tazobactam (ZOSYN) IVPB 2.25 g     2.25 g 100 mL/hr over 30 Minutes Intravenous STAT 08/18/15 2112 08/18/15 2252   08/18/15 2100  piperacillin-tazobactam (ZOSYN) IVPB 3.375 g  Status:  Discontinued     3.375 g 100 mL/hr over 30 Minutes Intravenous  Once 08/18/15 2051 08/18/15 2112   08/18/15 2100  vancomycin (VANCOCIN) IVPB 1000 mg/200 mL premix     1,000 mg 200 mL/hr over 60 Minutes Intravenous  Once 08/18/15 2051 08/18/15 2304       Objective: Filed  Vitals:   08/29/15 1400 08/29/15 2154 08/30/15 0452 08/30/15 1300  BP:  117/44 103/46 89/34  Pulse:  97 92 95  Temp:  97.6 F (36.4 C) 98.1 F (36.7 C) 98.7 F (37.1 C)  TempSrc:  Oral Oral Oral  Resp:  24 20 20   Height:      Weight:   97.5 kg (214 lb 15.2 oz)   SpO2: 93% 91% 91% 94%    Intake/Output Summary (Last 24 hours) at 08/30/15 1419 Last data filed at 08/30/15 1300  Gross per 24 hour  Intake    480 ml  Output   1750 ml  Net  -1270 ml   Filed Weights   08/28/15 0621 08/29/15 0441 08/30/15 0452  Weight: 102.7 kg (226 lb 6.6 oz) 100.3 kg (221 lb 1.9 oz) 97.5 kg (214 lb 15.2 oz)    Examination: General exam: Appears comfortable  HEENT: PERRLA, oral mucosa moist, no sclera icterus or thrush Respiratory system: Clear to auscultation. Respiratory effort normal. Cardiovascular system: S1 & S2 heard, RRR.  No murmurs  Gastrointestinal system: Abdomen soft, non-tender, nondistended. Normal bowel sound. No organomegaly Central nervous system: Alert and oriented. No focal neurological deficits. Extremities: No cyanosis, clubbing + anasarca in arms and legs Skin: No rashes or ulcers Psychiatry:  Mood & affect appropriate.     Data Reviewed: I have personally reviewed following labs and imaging studies  CBC:  Recent Labs Lab 08/26/15 0411 08/27/15 0400 08/28/15 0451 08/29/15 0532 08/30/15 0432  WBC 15.4* 12.3* 13.2* 11.8* 12.7*  HGB 10.7* 10.9* 10.2* 11.1* 10.3*  HCT 32.0* 32.8* 30.5* 33.3* 30.9*  MCV 82.5 82.6 81.8 82.6 82.6  PLT 258 267 268 260 Q000111Q   Basic Metabolic Panel:  Recent Labs Lab 08/25/15 0410 08/26/15 0657 08/27/15 0400 08/29/15 0532 08/30/15 0432  NA 139 138 139 138 139  K 3.7 5.3* 3.7 3.6 4.0  CL 109 110 105 104 101  CO2 22 16* 25 26 31   GLUCOSE 117* 112* 105* 111* 109*  BUN 20 21* 19 19 17   CREATININE 0.85 0.80 0.77 0.76 0.80  CALCIUM 7.5* 7.6* 7.7* 8.1* 8.1*   GFR: Estimated Creatinine Clearance: 67 mL/min (by C-G formula based on  Cr of 0.8). Liver Function Tests:  Recent Labs Lab 08/29/15 0532 08/30/15 0432  AST 263* 190*  ALT 290* 252*  ALKPHOS 177* 167*  BILITOT 0.7 0.4  PROT 5.3* 5.2*  ALBUMIN 2.3* 2.2*   No results for input(s): LIPASE, AMYLASE in the last 168 hours. No results for input(s): AMMONIA in the last 168 hours. Coagulation Profile: No results for input(s): INR, PROTIME in the last 168 hours. Cardiac Enzymes: No results for input(s): CKTOTAL, CKMB, CKMBINDEX, TROPONINI in the last 168 hours. BNP (last 3 results) No results for input(s): PROBNP in the last 8760 hours. HbA1C: No results for input(s): HGBA1C in the last 72 hours. CBG:  Recent Labs Lab 08/26/15 1711 08/26/15 2007 08/26/15  2333 08/27/15 0417 08/27/15 0748  GLUCAP 101* 145* 122* 84 100*   Lipid Profile: No results for input(s): CHOL, HDL, LDLCALC, TRIG, CHOLHDL, LDLDIRECT in the last 72 hours. Thyroid Function Tests: No results for input(s): TSH, T4TOTAL, FREET4, T3FREE, THYROIDAB in the last 72 hours. Anemia Panel: No results for input(s): VITAMINB12, FOLATE, FERRITIN, TIBC, IRON, RETICCTPCT in the last 72 hours. Urine analysis:    Component Value Date/Time   COLORURINE AMBER* 08/18/2015 2204   APPEARANCEUR CLOUDY* 08/18/2015 2204   LABSPEC 1.019 08/18/2015 2204   PHURINE 5.0 08/18/2015 2204   GLUCOSEU NEGATIVE 08/18/2015 2204   HGBUR NEGATIVE 08/18/2015 2204   BILIRUBINUR SMALL* 08/18/2015 2204   KETONESUR NEGATIVE 08/18/2015 2204   PROTEINUR NEGATIVE 08/18/2015 2204   UROBILINOGEN 0.2 09/19/2012 1438   NITRITE NEGATIVE 08/18/2015 2204   LEUKOCYTESUR NEGATIVE 08/18/2015 2204   Sepsis Labs: @LABRCNTIP (procalcitonin:4,lacticidven:4) ) No results found for this or any previous visit (from the past 240 hour(s)).       Radiology Studies: Ct Abdomen Pelvis W Contrast  08/28/2015  CLINICAL DATA:  Intermittent abdominal pain. History of breast cancer in remission. History of non-Hodgkin's lymphoma in  remission. History of CAD with stents. Stool incontinence and poor PO intake. EXAM: CT ABDOMEN AND PELVIS WITH CONTRAST TECHNIQUE: Multidetector CT imaging of the abdomen and pelvis was performed using the standard protocol following bolus administration of intravenous contrast. CONTRAST:  141mL ISOVUE-300 IOPAMIDOL (ISOVUE-300) INJECTION 61% COMPARISON:  08/18/2015 FINDINGS: Lower chest: Patchy infiltrates are identified in both lung bases. Small bilateral pleural effusions are noted. This represents a significant change since the previous exam. Hepatobiliary: Status post cholecystectomy. No focal abnormality identified within the liver. Pancreas: The pancreas has a normal appearance. Spleen: Normal in appearance. Renal/Adrenal: Adrenal glands are normal in appearance. There is symmetric enhancement and excretion from both kidneys. Small probable cysts are identified in the lower pole of the left kidney. Gastrointestinal tract: Small sliding-type hiatal hernia. Stomach otherwise is normal in appearance. Small bowel loops are normal in appearance. The appendix is well seen and has a normal appearance. There is moderate stool the mildly prominent loops of colon, not associated with obstructing mass. Reproductive: Stable appearance of endometrial thickening. Left adnexal cyst is 5.5 centimeters in diameter, stable in appearance. No evidence for right adnexal lesion. No free pelvic fluid. Bladder: Normal in appearance. Vascular/Lymphatic: There is atherosclerosis of the abdominal aorta. Interval development of hazy mesentery and increased prominence of mesenteric lymph nodes. Largest lymph node in the mesenteric root is 2.0 x 2.3 centimeters. Musculoskeletal/Abdominal wall: Increased body wall edema. Small paraumbilical fat containing hernia. Significant degenerative changes in the lower thoracic and lumbar spine. Sclerosis associated with degenerative changes. No suspicious lytic or blastic lesions. Other: No ascites.  IMPRESSION: 1. Interval development of bilateral lower lobe pulmonary infiltrates and small pleural effusions. 2. Interval development of stranding in the mesenteric root and increased prominence of mesenteric adenopathy. Considerations include lymphoproliferative disease, infection, inflammation. 3. Persistent moderate stool burden without evidence for obstruction. 4. Stable appearance of endometrial thickening. 5. Increased body wall edema. Electronically Signed   By: Nolon Nations M.D.   On: 08/28/2015 16:47      Scheduled Meds: . amitriptyline  100 mg Oral QHS  . aspirin EC  81 mg Oral q morning - 10a  . atorvastatin  80 mg Oral Daily  . heparin  5,000 Units Subcutaneous Q8H  . LORazepam  0.5 mg Oral BID  . pantoprazole  40 mg Oral Daily  . senna-docusate  2 tablet Oral BID  . sorbitol, milk of mag, mineral oil, glycerin (SMOG) enema  960 mL Rectal Once  . sorbitol, milk of mag, mineral oil, glycerin (SMOG) enema  960 mL Rectal Once   Continuous Infusions:    LOS: 11 days    Time spent in minutes: 67    [place], MD [facility] Pager: [email or web address] Password [email or web address] 08/30/2015, 2:19 PM

## 2015-08-31 ENCOUNTER — Inpatient Hospital Stay (HOSPITAL_COMMUNITY): Payer: PPO

## 2015-08-31 LAB — GLUCOSE, CAPILLARY: Glucose-Capillary: 163 mg/dL — ABNORMAL HIGH (ref 65–99)

## 2015-08-31 LAB — PROCALCITONIN: PROCALCITONIN: 0.19 ng/mL

## 2015-08-31 NOTE — Progress Notes (Signed)
PROGRESS NOTE    [name]  [id number] DOB: 04/08/1935 DOA: 08/18/2015  PCP:  Melinda Crutch, MD   Brief Narrative:  80 y/o with OSA on nocturnal O2, Breast Cancer s/p mastectomy in remision, Non-Hodgkin's Lymphoma in remission being followed by Dr Marin Olp, CAD with stents, endometrial thickening presented with stool incontinence and poor PO intake. She was in circulatory shock and did not improve despite IVF. Needed pressors. Admitted to ICU service.    Subjective: No further abdominal pain. Eating well. No new complaints.   Assessment & Plan:   Active Problems: Shock- likely septic and circulatory- CAP - lactic acidosis, hypotension, leukocytosis- temporarily on pressors - poor PO intake and diarrhea on admission has resolved - CT abd/ pelvis on 7/4 without IV contrast- increased distal stool burden suggesting constipation and liquid stool more proximal - CXR showed mild RUL infiltrate and mild bibasilar infiltrates - coag neg staph in 1 set of blood cultures - treated with Vanc and Zosyn initially until 7/7 and then with Levaquin for possible CAP- stopped Levaquin on 7/15 - WBC count 26 >>> 12  - currently no cough but is hypoxic and requiring O2 (86% on room air) - repeat CXR shows b/l infiltrates at bases- no cough or dyspnea- pro calcitonin negative- may be pulmonary edema?-  Lasix given- still has "chronic interstitial changed on CXR"   Elevated LFTs. leukocytosis H/o breast cancer and non-Hodgkin's lymphoma - Dr Martha Clan following- recent  PET CT showed enlarged lymph nodes - - CT on 7/14 shows enlarging lymph nodes, stranding at mesenteric root and significant amount of stool - outpt PET planned by Dr Marin Olp - LFTs rising since 7/3- obtain abdominal ultrasound today as LFTs not improving- no RUQ pain on exam- appetite has improved- no vomiting  Nausea, poor appetite, mucous per rectum, intermittent abdominal pains occuring in various locations -continue  laxatives/enemas- symptoms improved- eating well   Anasarca - due to fluid resusitation - resolved on Lasix- Creatinine stable  Sleep apnea - does not wear C PAP- cont O2  Endometrial thickening - D &  C planned by Dr Rosemarie Ax but patient is admitted to the hospital - she will f/u for re-scheduling of D & C   DVT prophylaxis: Heparin  Code Status: Full  Family Communication:  Disposition Plan: home Consultants:   Oncology Procedures:    Antimicrobials:  Anti-infectives    Start     Dose/Rate Route Frequency Ordered Stop   08/25/15 2000  levofloxacin (LEVAQUIN) tablet 750 mg  Status:  Discontinued     750 mg Oral Every 24 hours 08/25/15 1106 08/30/15 1406   08/22/15 2000  levofloxacin (LEVAQUIN) IVPB 750 mg  Status:  Discontinued     750 mg 100 mL/hr over 90 Minutes Intravenous Every 24 hours 08/22/15 1808 08/25/15 1106   08/21/15 2200  vancomycin (VANCOCIN) 1,250 mg in sodium chloride 0.9 % 250 mL IVPB  Status:  Discontinued     1,250 mg 166.7 mL/hr over 90 Minutes Intravenous Every 24 hours 08/21/15 0808 08/22/15 1758   08/20/15 2100  vancomycin (VANCOCIN) IVPB 1000 mg/200 mL premix  Status:  Discontinued     1,000 mg 200 mL/hr over 60 Minutes Intravenous Every 48 hours 08/18/15 2114 08/21/15 0808   08/20/15 1800  piperacillin-tazobactam (ZOSYN) IVPB 3.375 g  Status:  Discontinued     3.375 g 12.5 mL/hr over 240 Minutes Intravenous Every 8 hours 08/20/15 1346 08/21/15 1201   08/19/15 0500  piperacillin-tazobactam (ZOSYN) IVPB 2.25 g  Status:  Discontinued     2.25 g 100 mL/hr over 30 Minutes Intravenous Every 8 hours 08/18/15 2116 08/20/15 1346   08/18/15 2115  piperacillin-tazobactam (ZOSYN) IVPB 2.25 g     2.25 g 100 mL/hr over 30 Minutes Intravenous STAT 08/18/15 2112 08/18/15 2252   08/18/15 2100  piperacillin-tazobactam (ZOSYN) IVPB 3.375 g  Status:  Discontinued     3.375 g 100 mL/hr over 30 Minutes Intravenous  Once 08/18/15 2051 08/18/15 2112   08/18/15 2100   vancomycin (VANCOCIN) IVPB 1000 mg/200 mL premix     1,000 mg 200 mL/hr over 60 Minutes Intravenous  Once 08/18/15 2051 08/18/15 2304       Objective: Filed Vitals:   08/30/15 1419 08/30/15 2139 08/31/15 0527 08/31/15 1350  BP: 92/38 108/42 118/46 115/49  Pulse:  101 88 90  Temp:  98.5 F (36.9 C) 98 F (36.7 C) 98.6 F (37 C)  TempSrc:   Oral Oral  Resp:  20 20 20   Height:      Weight:   97.6 kg (215 lb 2.7 oz)   SpO2:  92% 92% 92%    Intake/Output Summary (Last 24 hours) at 08/31/15 1450 Last data filed at 08/31/15 1021  Gross per 24 hour  Intake    240 ml  Output      0 ml  Net    240 ml   Filed Weights   08/29/15 0441 08/30/15 0452 08/31/15 0527  Weight: 100.3 kg (221 lb 1.9 oz) 97.5 kg (214 lb 15.2 oz) 97.6 kg (215 lb 2.7 oz)    Examination: General exam: Appears comfortable  HEENT: PERRLA, oral mucosa moist, no sclera icterus or thrush Respiratory system: Clear to auscultation. Respiratory effort normal. Cardiovascular system: S1 & S2 heard, RRR.  No murmurs  Gastrointestinal system: Abdomen soft, non-tender, nondistended. Normal bowel sound. No organomegaly Central nervous system: Alert and oriented. No focal neurological deficits. Extremities: No cyanosis, clubbing + anasarca in arms and legs Skin: No rashes or ulcers Psychiatry:  Mood & affect appropriate.     Data Reviewed: I have personally reviewed following labs and imaging studies  CBC:  Recent Labs Lab 08/26/15 0411 08/27/15 0400 08/28/15 0451 08/29/15 0532 08/30/15 0432  WBC 15.4* 12.3* 13.2* 11.8* 12.7*  HGB 10.7* 10.9* 10.2* 11.1* 10.3*  HCT 32.0* 32.8* 30.5* 33.3* 30.9*  MCV 82.5 82.6 81.8 82.6 82.6  PLT 258 267 268 260 Q000111Q   Basic Metabolic Panel:  Recent Labs Lab 08/25/15 0410 08/26/15 0657 08/27/15 0400 08/29/15 0532 08/30/15 0432  NA 139 138 139 138 139  K 3.7 5.3* 3.7 3.6 4.0  CL 109 110 105 104 101  CO2 22 16* 25 26 31   GLUCOSE 117* 112* 105* 111* 109*  BUN 20 21*  19 19 17   CREATININE 0.85 0.80 0.77 0.76 0.80  CALCIUM 7.5* 7.6* 7.7* 8.1* 8.1*   GFR: Estimated Creatinine Clearance: 67 mL/min (by C-G formula based on Cr of 0.8). Liver Function Tests:  Recent Labs Lab 08/29/15 0532 08/30/15 0432  AST 263* 190*  ALT 290* 252*  ALKPHOS 177* 167*  BILITOT 0.7 0.4  PROT 5.3* 5.2*  ALBUMIN 2.3* 2.2*   No results for input(s): LIPASE, AMYLASE in the last 168 hours. No results for input(s): AMMONIA in the last 168 hours. Coagulation Profile: No results for input(s): INR, PROTIME in the last 168 hours. Cardiac Enzymes: No results for input(s): CKTOTAL, CKMB, CKMBINDEX, TROPONINI in the last 168 hours. BNP (last 3 results) No results for input(s):  PROBNP in the last 8760 hours. HbA1C: No results for input(s): HGBA1C in the last 72 hours. CBG:  Recent Labs Lab 08/26/15 1711 08/26/15 2007 08/26/15 2333 08/27/15 0417 08/27/15 0748  GLUCAP 101* 145* 122* 84 100*   Lipid Profile: No results for input(s): CHOL, HDL, LDLCALC, TRIG, CHOLHDL, LDLDIRECT in the last 72 hours. Thyroid Function Tests: No results for input(s): TSH, T4TOTAL, FREET4, T3FREE, THYROIDAB in the last 72 hours. Anemia Panel: No results for input(s): VITAMINB12, FOLATE, FERRITIN, TIBC, IRON, RETICCTPCT in the last 72 hours. Urine analysis:    Component Value Date/Time   COLORURINE AMBER* 08/18/2015 2204   APPEARANCEUR CLOUDY* 08/18/2015 2204   LABSPEC 1.019 08/18/2015 2204   PHURINE 5.0 08/18/2015 2204   GLUCOSEU NEGATIVE 08/18/2015 2204   HGBUR NEGATIVE 08/18/2015 2204   BILIRUBINUR SMALL* 08/18/2015 2204   KETONESUR NEGATIVE 08/18/2015 2204   PROTEINUR NEGATIVE 08/18/2015 2204   UROBILINOGEN 0.2 09/19/2012 1438   NITRITE NEGATIVE 08/18/2015 2204   LEUKOCYTESUR NEGATIVE 08/18/2015 2204   Sepsis Labs: @LABRCNTIP (procalcitonin:4,lacticidven:4) ) No results found for this or any previous visit (from the past 240 hour(s)).       Radiology Studies: Dg Chest  Port 1 View  08/31/2015  CLINICAL DATA:  Hypoxia. EXAM: PORTABLE CHEST 1 VIEW COMPARISON:  08/27/2015. FINDINGS: 1121 hours. The cardio pericardial silhouette is enlarged. Diffuse interstitial and bibasilar airspace disease persists, without substantial interval change. No overt airspace pulmonary edema. No substantial pleural effusion. Bones are demineralized. Telemetry leads overlie the chest. IMPRESSION: Cardiomegaly with underlying chronic interstitial changes. Degree of interstitial edema cannot be entirely excluded. Electronically Signed   By: Misty Stanley M.D.   On: 08/31/2015 11:47      Scheduled Meds: . amitriptyline  100 mg Oral QHS  . aspirin EC  81 mg Oral q morning - 10a  . atorvastatin  80 mg Oral Daily  . heparin  5,000 Units Subcutaneous Q8H  . LORazepam  0.5 mg Oral BID  . pantoprazole  40 mg Oral Daily  . senna-docusate  2 tablet Oral BID  . sorbitol, milk of mag, mineral oil, glycerin (SMOG) enema  960 mL Rectal Once   Continuous Infusions:    LOS: 12 days    Time spent in minutes: 58    [place], MD [facility] Pager: [email or web address] Password [email or web address] 08/31/2015, 2:50 PM

## 2015-09-01 DIAGNOSIS — A419 Sepsis, unspecified organism: Secondary | ICD-10-CM

## 2015-09-01 DIAGNOSIS — Z993 Dependence on wheelchair: Secondary | ICD-10-CM

## 2015-09-01 DIAGNOSIS — G4730 Sleep apnea, unspecified: Secondary | ICD-10-CM

## 2015-09-01 DIAGNOSIS — R938 Abnormal findings on diagnostic imaging of other specified body structures: Secondary | ICD-10-CM

## 2015-09-01 DIAGNOSIS — Z95828 Presence of other vascular implants and grafts: Secondary | ICD-10-CM

## 2015-09-01 DIAGNOSIS — R6521 Severe sepsis with septic shock: Secondary | ICD-10-CM

## 2015-09-01 DIAGNOSIS — K5901 Slow transit constipation: Secondary | ICD-10-CM

## 2015-09-01 DIAGNOSIS — E8770 Fluid overload, unspecified: Secondary | ICD-10-CM

## 2015-09-01 DIAGNOSIS — R7989 Other specified abnormal findings of blood chemistry: Secondary | ICD-10-CM

## 2015-09-01 DIAGNOSIS — E8779 Other fluid overload: Secondary | ICD-10-CM

## 2015-09-01 DIAGNOSIS — R945 Abnormal results of liver function studies: Secondary | ICD-10-CM

## 2015-09-01 DIAGNOSIS — I10 Essential (primary) hypertension: Secondary | ICD-10-CM

## 2015-09-01 DIAGNOSIS — D72829 Elevated white blood cell count, unspecified: Secondary | ICD-10-CM

## 2015-09-01 DIAGNOSIS — R9389 Abnormal findings on diagnostic imaging of other specified body structures: Secondary | ICD-10-CM

## 2015-09-01 DIAGNOSIS — J189 Pneumonia, unspecified organism: Secondary | ICD-10-CM

## 2015-09-01 LAB — COMPREHENSIVE METABOLIC PANEL
ALK PHOS: 166 U/L — AB (ref 38–126)
ALT: 199 U/L — ABNORMAL HIGH (ref 14–54)
ANION GAP: 5 (ref 5–15)
AST: 129 U/L — ABNORMAL HIGH (ref 15–41)
Albumin: 2.3 g/dL — ABNORMAL LOW (ref 3.5–5.0)
BUN: 13 mg/dL (ref 6–20)
CALCIUM: 8.3 mg/dL — AB (ref 8.9–10.3)
CO2: 28 mmol/L (ref 22–32)
Chloride: 105 mmol/L (ref 101–111)
Creatinine, Ser: 0.59 mg/dL (ref 0.44–1.00)
GFR calc non Af Amer: >60 mL/min (ref >60)
Glucose, Bld: 148 mg/dL — ABNORMAL HIGH (ref 65–99)
Potassium: 4.4 mmol/L (ref 3.5–5.1)
SODIUM: 138 mmol/L (ref 135–145)
Total Bilirubin: 0.3 mg/dL (ref 0.3–1.2)
Total Protein: 5.3 g/dL — ABNORMAL LOW (ref 6.5–8.1)

## 2015-09-01 LAB — CBC
HCT: 31.7 % — ABNORMAL LOW (ref 36.0–46.0)
HEMOGLOBIN: 10.2 g/dL — AB (ref 12.0–15.0)
MCH: 27 pg (ref 26.0–34.0)
MCHC: 32.2 g/dL (ref 30.0–36.0)
MCV: 83.9 fL (ref 78.0–100.0)
Platelets: 301 10*3/uL (ref 150–400)
RBC: 3.78 MIL/uL — AB (ref 3.87–5.11)
RDW: 18.1 % — ABNORMAL HIGH (ref 11.5–15.5)
WBC: 13.2 10*3/uL — ABNORMAL HIGH (ref 4.0–10.5)

## 2015-09-01 MED ORDER — LORAZEPAM 0.5 MG PO TABS: 0.5000 mg | ORAL_TABLET | Freq: Two times a day (BID) | ORAL | Status: DC

## 2015-09-01 MED ORDER — LORAZEPAM 2 MG PO TABS: 2.0000 mg | ORAL_TABLET | Freq: Two times a day (BID) | ORAL | Status: DC | PRN

## 2015-09-01 MED ORDER — LORAZEPAM 0.5 MG PO TABS
0.5000 mg | ORAL_TABLET | Freq: Two times a day (BID) | ORAL | Status: DC | PRN
Start: 2015-09-01 — End: 2015-09-18

## 2015-09-01 MED ORDER — SENNOSIDES-DOCUSATE SODIUM 8.6-50 MG PO TABS: 2.0000 | ORAL_TABLET | Freq: Two times a day (BID) | ORAL | Status: DC

## 2015-09-01 NOTE — Clinical Social Work Placement (Signed)
   CLINICAL SOCIAL WORK PLACEMENT  NOTE  Date:  09/01/2015  Patient Details  Name: [name] MRN: [id number] Date of Birth: Dec 30, 1935  Clinical Social Work is seeking post-discharge placement for this patient at the [place] level of care (*CSW will initial, date and re-position this form in  chart as items are completed):  Yes   Patient/family provided with [facility] Work Department's list of facilities offering this level of care within the geographic area requested by the patient (or if unable, by the patient's family).  Yes   Patient/family informed of their freedom to choose among providers that offer the needed level of care, that participate in Medicare, Medicaid or managed care program needed by the patient, have an available bed and are willing to accept the patient.  Yes   Patient/family informed of [facility]'s ownership interest in [facility] and [facility], as well as of the fact that they are under no obligation to receive care at these facilities.  PASRR submitted to EDS on       PASRR number received on       Existing PASRR number confirmed on 08/24/15     FL2 transmitted to all facilities in geographic area requested by pt/family on 08/24/15     FL2 transmitted to all facilities within larger geographic area on       Patient informed that his/her managed care company has contracts with or will negotiate with certain facilities, including the following:        Yes   Patient/family informed of bed offers received.  Patient chooses bed at [facility]     Physician recommends and patient chooses bed at      Patient to be transferred to [facility] on 09/01/15.  Patient to be transferred to facility by [facility]     Patient family notified on 09/01/15 of transfer.  Name of family member notified:        PHYSICIAN       Additional Comment:    _______________________________________________ Weston Anna,  LCSW 09/01/2015, 2:11 PM

## 2015-09-01 NOTE — Care Management Note (Signed)
Case Management Note  Patient Details  Name: [name] MRN: [id number] Date of Birth: 12/15/1935  Subjective/Objective:                    Action/Plan:d/c SNF.   Expected Discharge Date:   (unknown)               Expected Discharge Plan:  Skilled Nursing Facility  In-House Referral:  Clinical Social Work  Discharge planning Services  CM Consult  Post Acute Care Choice:    Choice offered to:     DME Arranged:    DME Agency:     HH Arranged:    [place] Agency:     Status of Service:  Completed, signed off  If discussed at [employer], dates discussed:    Additional Comments:  Dessa Phi, RN 09/01/2015, 12:44 PM

## 2015-09-01 NOTE — Discharge Summary (Signed)
Physician Discharge Summary  [name] [id number] DOB: September 12, 1935 DOA: 08/18/2015  PCP:  Melinda Crutch, MD  Admit date: 08/18/2015 Discharge date: 09/01/2015  Admitted From: home Disposition:  SNF   Recommendations for Outpatient Follow-up:  1. Needs outpatient PET ASAP- d/w Dr Marin Olp 2. Will be going to SNF 3. Daily weights- watch for fluid retention 4. Watch for constipation 5. F/u CXR in 1 wk to look for clearing of infiltrates 6. Keep pulse ox > 90% 7. 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 She was in circulatory shock and did not improve despite IVF. Needed pressors. Admitted to ICU service.   Hospital Course:  Active Problems: Shock- likely septic and circulatory- CAP - lactic acidosis, hypotension, leukocytosis- temporarily on pressors - poor PO intake and diarrhea on admission has resolved - CT abd/ pelvis on 7/4 without IV contrast- increased distal stool burden suggesting constipation and liquid stool more proximal - CXR showed mild RUL infiltrate and mild bibasilar infiltrates - coag neg staph in 1 set of blood cultures -  treated with Vanc and Zosyn initially until 7/7 and then with Levaquin for possible CAP- stopped Levaquin on 7/15 - WBC count 26 >>> 12  - currently no cough but is hypoxic and requiring O2 (86% on room air) - repeat CXR shows b/l infiltrates at bases- no cough or dyspnea- pro calcitonin negative- may be pulmonary edema?- Lasix given- still has "chronic interstitial changed on CXR"  H/o HTN - hold Lisinipril as BP not yet elevated.    Elevated LFTs. leukocytosis H/o breast cancer and non-Hodgkin's lymphoma - Dr Martha Clan following- recent PET CT showed enlarged lymph nodes - - CT on 7/14 shows enlarging lymph nodes, stranding at mesenteric root and significant amount of stool - outpt PET planned by Dr Marin Olp - LFTs rising since 7/3- obtained abdominal ultrasound - unrevealing- no RUQ pain on exam- appetite has improved- no vomiting - suspect elevated LFTS to be due to possible recurrent lymphoma  Nausea, poor appetite, mucous per rectum, intermittent abdominal pains occuring in various locations aggressively treated - continue laxatives/enemas- symptoms improved- eating well   Anasarca/ fluid overload/ grade 1 diastolic dysfunction -anasarca due to fluid resusitation - resolved on Lasix- Creatinine stable  Sleep apnea - does not wear C PAP- cont O2  Endometrial thickening - D & C planned by Dr Rosemarie Ax but patient is admitted to the hospital - she will f/u for re-scheduling of D & C  Discharge Instructions      Discharge Instructions    Diet - low sodium heart healthy    Complete by:  As directed  Increase activity slowly    Complete by:  As directed             Medication List    STOP taking these medications        quinapril 20 MG tablet  Commonly known as:  ACCUPRIL      TAKE these medications        acetaminophen 500 MG tablet  Commonly known as:  TYLENOL  Take 500-1,000 mg by mouth every 6 (six) hours as needed (for pain.).     amitriptyline 100 MG  tablet  Commonly known as:  ELAVIL  Take 100 mg by mouth at bedtime.     aspirin 81 MG EC tablet  Take 81 mg by mouth every morning.     atorvastatin 80 MG tablet  Commonly known as:  LIPITOR  Take 1 tablet (80 mg total) by mouth daily.     cholecalciferol 1000 units tablet  Commonly known as:  VITAMIN D  Take 1,000 Units by mouth every morning.     Cinnamon 500 MG Tabs  Take 1,000 mg by mouth every morning.     Fish Oil 1000 MG Caps  Take 1,000 mg by mouth every morning.     GARLIC PO  Take 1 tablet by mouth daily.     LORazepam 0.5 MG tablet  Commonly known as:  ATIVAN  Take 1 tablet (0.5 mg total) by mouth 2 (two) times daily as needed for anxiety.     multivitamin-iron-minerals-folic acid chewable tablet  Chew 1 tablet by mouth every morning.     nitroGLYCERIN 0.4 MG SL tablet  Commonly known as:  NITROSTAT  Place 1 tablet (0.4 mg total) under the tongue every 5 (five) minutes as needed for chest pain.     polyethylene glycol powder powder  Commonly known as:  GLYCOLAX/MIRALAX  Take 17 g by mouth 2 (two) times daily as needed.     senna-docusate 8.6-50 MG tablet  Commonly known as:  Senokot-S  Take 2 tablets by mouth 2 (two) times daily.       Follow-up Information    Follow up with  Melinda Crutch, MD In 1 week.   Specialty:  Family Medicine   Contact information:   [place] [address] [phone]       Follow up with Volanda Napoleon, MD In 1 week.   Specialty:  Oncology   Contact information:   [place] [address] [phone]      No Known Allergies   Procedures/Studies: 08/25/15 2 D ECHO Study Conclusions  - Left ventricle: The cavity size was normal. There was mild focal  basal hypertrophy of the septum. Systolic function was normal.  The estimated ejection fraction was in the range of 60% to 65%.  Wall motion was normal; there were no regional wall motion  abnormalities. Doppler  parameters are consistent with abnormal  left ventricular relaxation (grade 1 diastolic dysfunction).  Doppler parameters are consistent with high ventricular filling  pressure. - Pulmonary arteries: Systolic pressure was mildly increased. PA  peak pressure: 32 mm Hg (S). - Pericardium, extracardiac: A prominent pericardial fat pad was  present.  Impressions:  - Compared to the prior study, there has been no significant  interval change.   Ct Abdomen Pelvis Wo Contrast  08/19/2015  CLINICAL DATA:  Sepsis.  Elevated LFTs. EXAM: CT ABDOMEN AND PELVIS WITHOUT CONTRAST TECHNIQUE: Multidetector CT imaging of the abdomen and pelvis was performed  following the standard protocol without IV contrast. COMPARISON:  PET-CT 06/30/2015. Contrast-enhanced CT abdomen/pelvis 07/10/2014 FINDINGS: Lower chest: Chronic lung disease at the bases. Linear atelectasis or scarring, right greater than left. No pleural effusion. Liver: No focal lesion allowing for lack of IV contrast. Borderline steatosis. Hepatobiliary: Clips in the gallbladder fossa postcholecystectomy. No biliary dilatation. Pancreas: Mild atrophy.  No ductal dilatation or inflammation. Spleen: Normal. Adrenal glands: No nodule. Kidneys: Symmetric in size without stones or hydronephrosis. There is no perinephric stranding. Both ureters are decompressed without stones along the course. Stomach/Bowel: Stomach is decompressed. There are no dilated or thickened small bowel loops. Liquid and solid stool throughout the colon, with increased stool burden distally. No colonic inflammation. The appendix is not confidently identified. Vascular/Lymphatic: Small retroperitoneal lymph nodes, not enlarged by size criteria. Portacaval node measures 1.9 cm, unchanged from recent PET. Peripancreatic node measures 10 mm, unchanged. There is a prominent gastrohepatic node measuring 1 cm. This is unchanged from prior. Small central mesenteric and ileocolic nodes are  again seen, largest right lower quadrant node measures 11 mm, series 2, image 41, unchanged. No evidence of new adenopathy. Atherosclerosis of normal caliber abdominal aorta. No aortic aneurysm. Reproductive: Endometrium again appears prominent for postmenopausal patient, patient has undergone recent biopsy. Left ovarian cyst measures 5.7 cm, unchanged. Bladder: Physiologically distended, no wall thickening. Other: No free air, free fluid, or intra-abdominal fluid collection. Musculoskeletal: There are no acute or suspicious osseous abnormalities. Chronic endplate changes at [id number], stable from prior CT. Multilevel degenerative change throughout spine. IMPRESSION: 1. Increased distal stool burden suggesting constipation. Liquid stool in the more proximal colon, can be seen with associated diarrheal illness. 2. No additional acute abnormality in the abdomen/pelvis. Abdominal lymph nodes are unchanged from recent PET/CT. 3. Postcholecystectomy without biliary dilatation. Borderline mild hepatic steatosis. 4. Endometrial thickening, recently evaluated with biopsy. Left ovarian cyst is unchanged. Electronically Signed   By: Jeb Levering M.D.   On: 08/19/2015 00:32   Dg Chest 2 View  08/18/2015  CLINICAL DATA:  Hypotensive and tachycardic. EXAM: CHEST  2 VIEW COMPARISON:  08/11/2015 FINDINGS: There is rotational artifact aortic atherosclerosis noted. No pleural effusion or edema identified. No airspace consolidation. Mild multi level thoracic spondylosis noted. IMPRESSION: 1. No acute cardiopulmonary abnormalities. 2. Aortic atherosclerosis. Electronically Signed   By: Kerby Moors M.D.   On: 08/18/2015 21:25   US Abdomen Complete  08/31/2015  CLINICAL DATA:  80 year old female with elevated LFTs. History of lymphoma and cholecystectomy. EXAM: ABDOMEN ULTRASOUND COMPLETE COMPARISON:  08/28/2015 and prior CTs. FINDINGS: Gallbladder: Not visualized compatible with cholecystectomy. Common bile duct: Diameter: 3.2  mm. There is no evidence of intrahepatic or extrahepatic biliary dilatation. Liver: No focal lesion identified. Within normal limits in parenchymal echogenicity. IVC: No abnormality visualized. Pancreas: Visualized portion unremarkable. Spleen: Size and appearance within normal limits. Right Kidney: Length: 12 cm. Echogenicity within normal limits. No mass or hydronephrosis visualized. Left Kidney: Length: 12.8 cm. Echogenicity within normal limits. No mass or hydronephrosis visualized. Abdominal aorta: No aneurysm visualized but the distal abdominal aorta is not visualized secondary overlying bowel gas. Other findings: None. IMPRESSION: No evidence of acute or hepatic abnormality. Distal aorta not well visualized. Electronically Signed   By: Margarette Canada M.D.   On: 08/31/2015 14:51   Ct Abdomen Pelvis W Contrast  08/28/2015  CLINICAL DATA:  Intermittent abdominal pain. History of breast cancer in remission. History of non-Hodgkin's lymphoma in remission. History of CAD with stents. Stool incontinence and poor PO intake.  EXAM: CT ABDOMEN AND PELVIS WITH CONTRAST TECHNIQUE: Multidetector CT imaging of the abdomen and pelvis was performed using the standard protocol following bolus administration of intravenous contrast. CONTRAST:  18mL ISOVUE-300 IOPAMIDOL (ISOVUE-300) INJECTION 61% COMPARISON:  08/18/2015 FINDINGS: Lower chest: Patchy infiltrates are identified in both lung bases. Small bilateral pleural effusions are noted. This represents a significant change since the previous exam. Hepatobiliary: Status post cholecystectomy. No focal abnormality identified within the liver. Pancreas: The pancreas has a normal appearance. Spleen: Normal in appearance. Renal/Adrenal: Adrenal glands are normal in appearance. There is symmetric enhancement and excretion from both kidneys. Small probable cysts are identified in the lower pole of the left kidney. Gastrointestinal tract: Small sliding-type hiatal hernia. Stomach  otherwise is normal in appearance. Small bowel loops are normal in appearance. The appendix is well seen and has a normal appearance. There is moderate stool the mildly prominent loops of colon, not associated with obstructing mass. Reproductive: Stable appearance of endometrial thickening. Left adnexal cyst is 5.5 centimeters in diameter, stable in appearance. No evidence for right adnexal lesion. No free pelvic fluid. Bladder: Normal in appearance. Vascular/Lymphatic: There is atherosclerosis of the abdominal aorta. Interval development of hazy mesentery and increased prominence of mesenteric lymph nodes. Largest lymph node in the mesenteric root is 2.0 x 2.3 centimeters. Musculoskeletal/Abdominal wall: Increased body wall edema. Small paraumbilical fat containing hernia. Significant degenerative changes in the lower thoracic and lumbar spine. Sclerosis associated with degenerative changes. No suspicious lytic or blastic lesions. Other: No ascites. IMPRESSION: 1. Interval development of bilateral lower lobe pulmonary infiltrates and small pleural effusions. 2. Interval development of stranding in the mesenteric root and increased prominence of mesenteric adenopathy. Considerations include lymphoproliferative disease, infection, inflammation. 3. Persistent moderate stool burden without evidence for obstruction. 4. Stable appearance of endometrial thickening. 5. Increased body wall edema. Electronically Signed   By: Nolon Nations M.D.   On: 08/28/2015 16:47   Dg Chest Port 1 View  08/31/2015  CLINICAL DATA:  Hypoxia. EXAM: PORTABLE CHEST 1 VIEW COMPARISON:  08/27/2015. FINDINGS: 1121 hours. The cardio pericardial silhouette is enlarged. Diffuse interstitial and bibasilar airspace disease persists, without substantial interval change. No overt airspace pulmonary edema. No substantial pleural effusion. Bones are demineralized. Telemetry leads overlie the chest. IMPRESSION: Cardiomegaly with underlying chronic  interstitial changes. Degree of interstitial edema cannot be entirely excluded. Electronically Signed   By: Misty Stanley M.D.   On: 08/31/2015 11:47   Dg Chest Port 1 View  08/27/2015  CLINICAL DATA:  80 year old female with hypoxia. History of breast cancer and non-Hodgkin's lymphoma. EXAM: PORTABLE CHEST 1 VIEW COMPARISON:  08/22/2015 and prior radiographs FINDINGS: This is a mildly low volume film. Cardiomegaly is present. Bibasilar opacities have slightly increased - question atelectasis versus airspace disease. Mild interstitial opacities are again identified. No large pleural effusions or pneumothorax identified. IMPRESSION: Increasing bibasilar opacities -question atelectasis versus airspace disease/pneumonia. Mild interstitial opacities again noted which may represent mild edema. Electronically Signed   By: Margarette Canada M.D.   On: 08/27/2015 17:08   Dg Chest Port 1 View  08/22/2015  CLINICAL DATA:  Shortness of breath.  Wheezing. EXAM: PORTABLE CHEST 1 VIEW COMPARISON:  08/18/2015, 08/11/2015.  PET-CT 06/30/2015 FINDINGS: Mediastinum and hilar structures normal. Stable cardiomegaly. Mild right upper lobe infiltrate cannot be excluded. Low lung volumes with mild bibasilar atelectasis. No prominent pleural effusion. No pneumothorax. Degenerative changes thoracic spine IMPRESSION: 1.  Mild right upper lobe infiltrate cannot be excluded. 2.  Mild bibasilar atelectasis and/or  infiltrates. 3. Stable cardiomegaly . Electronically Signed   By: Marcello Moores  Register   On: 08/22/2015 09:27   Dg Abd Portable 1v  08/25/2015  CLINICAL DATA:  Constipation Hx colon polyps. Hx SBO. Hx hiatal hernia. Hx breast cancer. Hx gallbladder surgery. EXAM: PORTABLE ABDOMEN - 1 VIEW COMPARISON:  CT, 08/18/2015 FINDINGS: Normal bowel gas pattern with no evidence of obstruction or generalized adynamic ileus. No significant increase in colonic stool. No free air. Soft tissues are unremarkable. IMPRESSION: 1. No acute findings. No  evidence of bowel obstruction or generalized adynamic ileus. No significant increase in colonic stool. Electronically Signed   By: Lajean Manes M.D.   On: 08/25/2015 12:37       Discharge Exam: Filed Vitals:   08/31/15 2158 09/01/15 0502  BP: 110/46 113/48  Pulse: 92 96  Temp: 98.7 F (37.1 C) 97.5 F (36.4 C)  Resp: 20 20   Filed Vitals:   08/31/15 0527 08/31/15 1350 08/31/15 2158 09/01/15 0502  BP: 118/46 115/49 110/46 113/48  Pulse: 88 90 92 96  Temp: 98 F (36.7 C) 98.6 F (37 C) 98.7 F (37.1 C) 97.5 F (36.4 C)  TempSrc: Oral Oral Oral Oral  Resp: 20 20 20 20   Height:      Weight: 97.6 kg (215 lb 2.7 oz)   98.6 kg (217 lb 6 oz)  SpO2: 92% 92% 97% 91%    General: Pt is alert, awake, not in acute distress Cardiovascular: RRR, S1/S2 +, no rubs, no gallops Respiratory: CTA bilaterally, no wheezing, no rhonchi Abdominal: Soft, NT, ND, bowel sounds +, morbidly obese Extremities: no edema, no cyanosis    The results of significant diagnostics from this hospitalization (including imaging, microbiology, ancillary and laboratory) are listed below for reference.     Microbiology: No results found for this or any previous visit (from the past 240 hour(s)).   Labs: BNP (last 3 results) No results for input(s): BNP in the last 8760 hours. Basic Metabolic Panel:  Recent Labs Lab 08/26/15 0657 08/27/15 0400 08/29/15 0532 08/30/15 0432 09/01/15 0449  NA 138 139 138 139 138  K 5.3* 3.7 3.6 4.0 4.4  CL 110 105 104 101 105  CO2 16* 25 26 31 28   GLUCOSE 112* 105* 111* 109* 148*  BUN 21* 19 19 17 13   CREATININE 0.80 0.77 0.76 0.80 0.59  CALCIUM 7.6* 7.7* 8.1* 8.1* 8.3*   Liver Function Tests:  Recent Labs Lab 08/29/15 0532 08/30/15 0432 09/01/15 0449  AST 263* 190* 129*  ALT 290* 252* 199*  ALKPHOS 177* 167* 166*  BILITOT 0.7 0.4 0.3  PROT 5.3* 5.2* 5.3*  ALBUMIN 2.3* 2.2* 2.3*   No results for input(s): LIPASE, AMYLASE in the last 168 hours. No  results for input(s): AMMONIA in the last 168 hours. CBC:  Recent Labs Lab 08/27/15 0400 08/28/15 0451 08/29/15 0532 08/30/15 0432 09/01/15 0449  WBC 12.3* 13.2* 11.8* 12.7* 13.2*  HGB 10.9* 10.2* 11.1* 10.3* 10.2*  HCT 32.8* 30.5* 33.3* 30.9* 31.7*  MCV 82.6 81.8 82.6 82.6 83.9  PLT 267 268 260 291 301   Cardiac Enzymes: No results for input(s): CKTOTAL, CKMB, CKMBINDEX, TROPONINI in the last 168 hours. BNP: Invalid input(s): POCBNP CBG:  Recent Labs Lab 08/26/15 2007 08/26/15 2333 08/27/15 0417 08/27/15 0748 08/31/15 2247  GLUCAP 145* 122* 84 100* 163*   D-Dimer No results for input(s): DDIMER in the last 72 hours. Hgb A1c No results for input(s): HGBA1C in the last 72 hours. Lipid Profile  No results for input(s): CHOL, HDL, LDLCALC, TRIG, CHOLHDL, LDLDIRECT in the last 72 hours. Thyroid function studies No results for input(s): TSH, T4TOTAL, T3FREE, THYROIDAB in the last 72 hours.  Invalid input(s): FREET3 Anemia work up No results for input(s): VITAMINB12, FOLATE, FERRITIN, TIBC, IRON, RETICCTPCT in the last 72 hours. Urinalysis    Component Value Date/Time   COLORURINE AMBER* 08/18/2015 2204   APPEARANCEUR CLOUDY* 08/18/2015 2204   LABSPEC 1.019 08/18/2015 2204   PHURINE 5.0 08/18/2015 2204   GLUCOSEU NEGATIVE 08/18/2015 2204   HGBUR NEGATIVE 08/18/2015 2204   BILIRUBINUR SMALL* 08/18/2015 2204   KETONESUR NEGATIVE 08/18/2015 2204   PROTEINUR NEGATIVE 08/18/2015 2204   UROBILINOGEN 0.2 09/19/2012 1438   NITRITE NEGATIVE 08/18/2015 2204   LEUKOCYTESUR NEGATIVE 08/18/2015 2204   Sepsis Labs Invalid input(s): PROCALCITONIN,  WBC,  LACTICIDVEN Microbiology No results found for this or any previous visit (from the past 240 hour(s)).   Time coordinating discharge: Over 30 minutes  SIGNED:   Debbe Odea, MD  [facility] 09/01/2015, 9:57 AM Pager   If 7PM-7AM, please contact night-coverage [email or web address] Password [email or web address]

## 2015-09-01 NOTE — Progress Notes (Signed)
Patient is set to discharge to [facility] today. Patient & friend, [name], aware. Discharge packet given to RN, Katharine Look. [facility] called for transport.   Kingsley Spittle, [place] Clinical Social Worker [phone]

## 2015-09-02 LAB — HEPATITIS PANEL, ACUTE
HEP A IGM: NEGATIVE
Hep B C IgM: NEGATIVE
Hepatitis B Surface Ag: NEGATIVE

## 2015-09-04 ENCOUNTER — Other Ambulatory Visit: Payer: Self-pay

## 2015-09-05 ENCOUNTER — Telehealth: Payer: Self-pay | Admitting: Cardiology

## 2015-09-05 NOTE — Telephone Encounter (Signed)
Called patient about her message. Patient stated that she has a headache and her stomach is upset. Patient also stated that her O2 was low yesterday at 85%, but she had mistakenly taken her oxygen off. Informed patient that she needs to be further evaluated, and that she might need to go to urgent care. Encouraged patient to talk to nurses there at the nursing home for help. Patient verbalized understanding and will talk to the nurses at the nursing home.

## 2015-09-05 NOTE — Telephone Encounter (Signed)
New Message  Pt requtesting to speak with RN. Pt states she is in [place] home and needs to be see by Dr. Stanford Breed. Before i could get a good call back number so i just took the number off the caller id. Pt would like to speak with RN. Please call back to discuss

## 2015-09-08 ENCOUNTER — Ambulatory Visit (HOSPITAL_COMMUNITY)
Admit: 2015-09-08 | Discharge: 2015-09-08 | Disposition: A | Discharge status: Home / Self Care | Payer: PPO | Attending: Hematology & Oncology | Admitting: Hematology & Oncology

## 2015-09-08 ENCOUNTER — Telehealth: Payer: Self-pay | Admitting: *Deleted

## 2015-09-08 DIAGNOSIS — M818 Other osteoporosis without current pathological fracture: Secondary | ICD-10-CM

## 2015-09-08 DIAGNOSIS — T386X5A Adverse effect of antigonadotrophins, antiestrogens, antiandrogens, not elsewhere classified, initial encounter: Secondary | ICD-10-CM

## 2015-09-08 DIAGNOSIS — C8330 Diffuse large B-cell lymphoma, unspecified site: Secondary | ICD-10-CM

## 2015-09-08 DIAGNOSIS — C8580 Other specified types of non-Hodgkin lymphoma, unspecified site: Principal | ICD-10-CM

## 2015-09-08 LAB — GLUCOSE, CAPILLARY: GLUCOSE-CAPILLARY: 147 mg/dL — AB (ref 65–99)

## 2015-09-08 MED ORDER — FLUDEOXYGLUCOSE F - 18 (FDG) INJECTION
10.8200 | Freq: Once | INTRAVENOUS | Status: AC | PRN
  Administered 2015-09-08: 10.82 via INTRAVENOUS

## 2015-09-08 NOTE — Telephone Encounter (Signed)
Received call from patient worried she cant come to her appt because she doesn't have a ride.  Called [facility] and talked to them.  They will provide transportation for patient.

## 2015-09-10 ENCOUNTER — Ambulatory Visit (HOSPITAL_COMMUNITY): Payer: PPO

## 2015-09-12 ENCOUNTER — Other Ambulatory Visit (HOSPITAL_BASED_OUTPATIENT_CLINIC_OR_DEPARTMENT_OTHER): Payer: PPO

## 2015-09-12 ENCOUNTER — Ambulatory Visit (HOSPITAL_BASED_OUTPATIENT_CLINIC_OR_DEPARTMENT_OTHER): Payer: PPO | Admitting: Hematology & Oncology

## 2015-09-12 VITALS — BP 120/67 | HR 105 | Temp 98.4°F | Resp 20

## 2015-09-12 DIAGNOSIS — Z853 Personal history of malignant neoplasm of breast: Secondary | ICD-10-CM

## 2015-09-12 DIAGNOSIS — Z8572 Personal history of non-Hodgkin lymphomas: Secondary | ICD-10-CM | POA: Diagnosis not present

## 2015-09-12 DIAGNOSIS — C8330 Diffuse large B-cell lymphoma, unspecified site: Secondary | ICD-10-CM

## 2015-09-12 DIAGNOSIS — C8580 Other specified types of non-Hodgkin lymphoma, unspecified site: Secondary | ICD-10-CM

## 2015-09-12 DIAGNOSIS — M818 Other osteoporosis without current pathological fracture: Secondary | ICD-10-CM

## 2015-09-12 DIAGNOSIS — T386X5A Adverse effect of antigonadotrophins, antiestrogens, antiandrogens, not elsewhere classified, initial encounter: Principal | ICD-10-CM

## 2015-09-12 LAB — COMPREHENSIVE METABOLIC PANEL
ALBUMIN: 2.9 g/dL — AB (ref 3.5–5.0)
ALK PHOS: 230 U/L — AB (ref 40–150)
ALT: 75 U/L — ABNORMAL HIGH (ref 0–55)
ANION GAP: 13 meq/L — AB (ref 3–11)
AST: 51 U/L — AB (ref 5–34)
BUN: 11.8 mg/dL (ref 7.0–26.0)
CALCIUM: 9.5 mg/dL (ref 8.4–10.4)
CO2: 18 mEq/L — ABNORMAL LOW (ref 22–29)
CREATININE: 0.8 mg/dL (ref 0.6–1.1)
Chloride: 106 mEq/L (ref 98–109)
EGFR: 76 mL/min/{1.73_m2} — ABNORMAL LOW (ref >90)
Glucose: 122 mg/dl (ref 70–140)
POTASSIUM: 4 meq/L (ref 3.5–5.1)
Sodium: 138 mEq/L (ref 136–145)
Total Bilirubin: 0.58 mg/dL (ref 0.20–1.20)
Total Protein: 6.4 g/dL (ref 6.4–8.3)

## 2015-09-12 LAB — CBC WITH DIFFERENTIAL (CANCER CENTER ONLY)
BASO#: 0 10*3/uL (ref 0.0–0.2)
BASO%: 0.3 % (ref 0.0–2.0)
EOS%: 13.3 % — AB (ref 0.0–7.0)
Eosinophils Absolute: 1.8 10*3/uL — ABNORMAL HIGH (ref 0.0–0.5)
HCT: 39 % (ref 34.8–46.6)
HGB: 12.8 g/dL (ref 11.6–15.9)
LYMPH#: 1.5 10*3/uL (ref 0.9–3.3)
LYMPH%: 11 % — AB (ref 14.0–48.0)
MCH: 27.9 pg (ref 26.0–34.0)
MCHC: 32.8 g/dL (ref 32.0–36.0)
MCV: 85 fL (ref 81–101)
MONO#: 0.9 10*3/uL (ref 0.1–0.9)
MONO%: 6.9 % (ref 0.0–13.0)
NEUT#: 9.2 10*3/uL — ABNORMAL HIGH (ref 1.5–6.5)
NEUT%: 68.5 % (ref 39.6–80.0)
PLATELETS: 406 10*3/uL — AB (ref 145–400)
RBC: 4.58 10*6/uL (ref 3.70–5.32)
RDW: 18 % — AB (ref 11.1–15.7)
WBC: 13.4 10*3/uL — AB (ref 3.9–10.0)

## 2015-09-12 LAB — LACTATE DEHYDROGENASE: LDH: 312 U/L — ABNORMAL HIGH (ref 125–245)

## 2015-09-12 NOTE — Progress Notes (Signed)
Hematology and Oncology Follow Up Visit  Asharia Lotter Garlick [id number] [birth year] 80 y.o. 09/12/2015   Principle Diagnosis:   Diffuse large cell non-Hodgkin's lymphoma-treated with possible relapse  Remote history of stage I/II lobular ca of the right breast - ER+/HER2 -  Current Therapy:    Observation     Interim History:  Ms. Wittmann is back for follow-up. She was recently hospitalized. This was for weakness. She had some abdominal pain.  She had multiple scans in the hospital. The scans were not conclusive from my point of view. If she did have a positive blood culture for coag-negative staph. She may have had a right upper lobe pneumonia. Her white blood cell count was elevated.  We did oh ahead and do a PET scan on her. This was done on July 24. The PET scan reading was nonconclusive from my point of view. She had some stable lymph nodes which were mostly normal in size. She had mild increase in bilateral hilar and mediastinal nodes. The level of activity was not that great.  She's had no cough. She's had no shortness of breath. She's had no bleeding.   She is in a rehabilitation center. She is not too happy about being there.   She comes in a wheelchair. She has a performance status of ECOG 2-3 at best.   Medications:  Current Outpatient Prescriptions:  .  acetaminophen (TYLENOL) 500 MG tablet, Take 500-1,000 mg by mouth every 6 (six) hours as needed (for pain.)., Disp: , Rfl:  .  amitriptyline (ELAVIL) 100 MG tablet, Take 100 mg by mouth at bedtime., Disp: , Rfl:  .  aspirin 81 MG EC tablet, Take 81 mg by mouth every morning. , Disp: , Rfl:  .  atorvastatin (LIPITOR) 80 MG tablet, Take 1 tablet (80 mg total) by mouth daily., Disp: 90 tablet, Rfl: 3 .  cholecalciferol (VITAMIN D) 1000 UNITS tablet, Take 1,000 Units by mouth every morning. , Disp: , Rfl:  .  Cinnamon 500 MG TABS, Take 1,000 mg by mouth every morning. , Disp: , Rfl:  .  GARLIC PO, Take 1 tablet by mouth daily.,  Disp: , Rfl:  .  LORazepam (ATIVAN) 0.5 MG tablet, Take 1 tablet (0.5 mg total) by mouth 2 (two) times daily as needed for anxiety., Disp: 30 tablet, Rfl: 0 .  multivitamin-iron-minerals-folic acid (CENTRUM) chewable tablet, Chew 1 tablet by mouth every morning. , Disp: , Rfl:  .  nitroGLYCERIN (NITROSTAT) 0.4 MG SL tablet, Place 1 tablet (0.4 mg total) under the tongue every 5 (five) minutes as needed for chest pain., Disp: 25 tablet, Rfl: 11 .  Omega-3 Fatty Acids (FISH OIL) 1000 MG CAPS, Take 1,000 mg by mouth every morning. , Disp: , Rfl:  .  polyethylene glycol powder (GLYCOLAX/MIRALAX) powder, Take 17 g by mouth 2 (two) times daily as needed. (Patient taking differently: Take 17 g by mouth 2 (two) times daily as needed for mild constipation. ), Disp: 850 g, Rfl: 5 .  senna-docusate (SENOKOT-S) 8.6-50 MG tablet, Take 2 tablets by mouth 2 (two) times daily., Disp: 60 tablet, Rfl: 0  Allergies: No Known Allergies  Past Medical History, Surgical history, Social history, and Family History were reviewed and updated.  Review of Systems: As above  Physical Exam:  oral temperature is 98.4 F (36.9 C). Her blood pressure is 120/67 and her pulse is 105 (abnormal). Her respiration is 20.   Wt Readings from Last 3 Encounters:  09/01/15 217 lb 6 oz (  98.6 kg)  07/10/15 170 lb (77.1 kg)  06/02/15 170 lb (77.1 kg)     Obese white female in no obvious distress. Head and neck exam shows no ocular or oral lesions. She has no palpable cervical or supraclavicular lymph nodes. Lungs are with some decrease at the bases. Cardiac exam regular rate and rhythm with no murmurs, rubs or bruits. Axillary exam shows no bilateral axillary adenopathy. Abdomen is soft. She is obese. She has no fluid wave. There is no guarding or rebound tenderness. There is no palpable liver or spleen tip. Extremities shows some chronic nonpitting edema of her legs. Neurological exam is nonfocal. Skin exam shows no rashes, ecchymoses  or petechia.  Lab Results  Component Value Date   WBC 13.4 (H) 09/12/2015   HGB 12.8 09/12/2015   HCT 39.0 09/12/2015   MCV 85 09/12/2015   PLT 406 (H) 09/12/2015     Chemistry      Component Value Date/Time   NA 138 09/01/2015 0449   NA 135 07/10/2015 1530   NA 140 02/04/2015 1028   K 4.4 09/01/2015 0449   K 4.3 07/10/2015 1530   K 4.4 02/04/2015 1028   CL 105 09/01/2015 0449   CL 101 07/10/2015 1530   CL 107 04/05/2012 1158   CO2 28 09/01/2015 0449   CO2 26 07/10/2015 1530   CO2 28 02/04/2015 1028   BUN 13 09/01/2015 0449   BUN 15 07/10/2015 1530   BUN 13.6 02/04/2015 1028   CREATININE 0.59 09/01/2015 0449   CREATININE 0.81 07/10/2015 1530   CREATININE 0.9 02/04/2015 1028      Component Value Date/Time   CALCIUM 8.3 (L) 09/01/2015 0449   CALCIUM 9.6 07/10/2015 1530   CALCIUM 9.7 02/04/2015 1028   ALKPHOS 166 (H) 09/01/2015 0449   ALKPHOS 249 (H) 07/10/2015 1530   ALKPHOS 224 (H) 02/04/2015 1028   AST 129 (H) 09/01/2015 0449   AST 25 07/10/2015 1530   AST 20 02/04/2015 1028   ALT 199 (H) 09/01/2015 0449   ALT 33 (H) 07/10/2015 1530   ALT 31 02/04/2015 1028   BILITOT 0.3 09/01/2015 0449   BILITOT 0.5 07/10/2015 1530   BILITOT 0.99 02/04/2015 1028         Impression and Plan: Ms. Beeck is a 80 year old white female. She's not in the best shape. She had diffuse large cell non-Hodgkin's lymphoma. She was treated with 6 cycles of Rituxan/bendamustine. This was very reasonable. It is apparent that she did not have a great performance status that would allow for aggressive therapy.  I am still not convinced that she has recurrent disease. The PET scan report is, in my mind, not definitive. I would think that if she had recurrent large cell lymphoma, that we would be seen much more in the way of activity.  Unfortunately, the only way that we can see how things are going is to do another PET scan on her. I will plan to get one set up on her in early October.  She  has quite a few other issues.  Hopefully, she will stay out of the hospital. I'm not sure when she is going to have a D&C.   I spent about 30 minutes with her today.    Volanda Napoleon, MD 7/28/20172:55 PM

## 2015-09-14 LAB — BETA-2-GLYCOPROTEIN I ABS, IGG/M/A
Beta-2 Glyco 1 IgA: <9 GPI IgA units (ref 0–25)
Beta-2 Glyco 1 IgM: <9 GPI IgM units (ref 0–32)

## 2015-09-15 LAB — VITAMIN D 25 HYDROXY (VIT D DEFICIENCY, FRACTURES): Vitamin D, 25-Hydroxy: 30.3 ng/mL (ref 30.0–100.0)

## 2015-09-17 ENCOUNTER — Inpatient Hospital Stay (HOSPITAL_COMMUNITY)
Admission: EM | Admit: 2015-09-17 | Discharge: 2015-09-20 | DRG: 872 | Disposition: A | Discharge status: Skilled Nursing Facility | Payer: PPO | Attending: Family Medicine | Admitting: Family Medicine

## 2015-09-17 DIAGNOSIS — Z8572 Personal history of non-Hodgkin lymphomas: Secondary | ICD-10-CM

## 2015-09-17 DIAGNOSIS — Z993 Dependence on wheelchair: Secondary | ICD-10-CM

## 2015-09-17 DIAGNOSIS — I2510 Atherosclerotic heart disease of native coronary artery without angina pectoris: Secondary | ICD-10-CM | POA: Diagnosis present

## 2015-09-17 DIAGNOSIS — F410 Panic disorder [episodic paroxysmal anxiety] without agoraphobia: Secondary | ICD-10-CM | POA: Diagnosis present

## 2015-09-17 DIAGNOSIS — F329 Major depressive disorder, single episode, unspecified: Secondary | ICD-10-CM | POA: Diagnosis present

## 2015-09-17 DIAGNOSIS — I110 Hypertensive heart disease with heart failure: Secondary | ICD-10-CM | POA: Diagnosis present

## 2015-09-17 DIAGNOSIS — I959 Hypotension, unspecified: Secondary | ICD-10-CM | POA: Diagnosis present

## 2015-09-17 DIAGNOSIS — Z9981 Dependence on supplemental oxygen: Secondary | ICD-10-CM

## 2015-09-17 DIAGNOSIS — E860 Dehydration: Secondary | ICD-10-CM | POA: Diagnosis not present

## 2015-09-17 DIAGNOSIS — I5030 Unspecified diastolic (congestive) heart failure: Secondary | ICD-10-CM | POA: Diagnosis present

## 2015-09-17 DIAGNOSIS — R509 Fever, unspecified: Secondary | ICD-10-CM

## 2015-09-17 DIAGNOSIS — Z955 Presence of coronary angioplasty implant and graft: Secondary | ICD-10-CM

## 2015-09-17 DIAGNOSIS — E876 Hypokalemia: Secondary | ICD-10-CM | POA: Diagnosis present

## 2015-09-17 DIAGNOSIS — K219 Gastro-esophageal reflux disease without esophagitis: Secondary | ICD-10-CM | POA: Diagnosis present

## 2015-09-17 DIAGNOSIS — Z79899 Other long term (current) drug therapy: Secondary | ICD-10-CM

## 2015-09-17 DIAGNOSIS — R627 Adult failure to thrive: Secondary | ICD-10-CM | POA: Diagnosis present

## 2015-09-17 DIAGNOSIS — R739 Hyperglycemia, unspecified: Secondary | ICD-10-CM | POA: Diagnosis present

## 2015-09-17 DIAGNOSIS — D72829 Elevated white blood cell count, unspecified: Secondary | ICD-10-CM | POA: Diagnosis present

## 2015-09-17 DIAGNOSIS — Z9049 Acquired absence of other specified parts of digestive tract: Secondary | ICD-10-CM

## 2015-09-17 DIAGNOSIS — A419 Sepsis, unspecified organism: Secondary | ICD-10-CM | POA: Diagnosis not present

## 2015-09-17 DIAGNOSIS — Z7982 Long term (current) use of aspirin: Secondary | ICD-10-CM

## 2015-09-17 DIAGNOSIS — C8330 Diffuse large B-cell lymphoma, unspecified site: Secondary | ICD-10-CM | POA: Diagnosis present

## 2015-09-17 DIAGNOSIS — I6523 Occlusion and stenosis of bilateral carotid arteries: Secondary | ICD-10-CM | POA: Diagnosis present

## 2015-09-17 DIAGNOSIS — R6520 Severe sepsis without septic shock: Secondary | ICD-10-CM | POA: Diagnosis present

## 2015-09-17 DIAGNOSIS — Z853 Personal history of malignant neoplasm of breast: Secondary | ICD-10-CM

## 2015-09-17 DIAGNOSIS — F419 Anxiety disorder, unspecified: Secondary | ICD-10-CM | POA: Diagnosis present

## 2015-09-17 DIAGNOSIS — Z9842 Cataract extraction status, left eye: Secondary | ICD-10-CM

## 2015-09-17 DIAGNOSIS — Z86010 Personal history of colonic polyps: Secondary | ICD-10-CM

## 2015-09-17 DIAGNOSIS — I5032 Chronic diastolic (congestive) heart failure: Secondary | ICD-10-CM | POA: Diagnosis present

## 2015-09-17 DIAGNOSIS — E46 Unspecified protein-calorie malnutrition: Secondary | ICD-10-CM | POA: Diagnosis present

## 2015-09-17 DIAGNOSIS — Z9841 Cataract extraction status, right eye: Secondary | ICD-10-CM

## 2015-09-17 DIAGNOSIS — R748 Abnormal levels of other serum enzymes: Secondary | ICD-10-CM | POA: Diagnosis present

## 2015-09-17 DIAGNOSIS — G4733 Obstructive sleep apnea (adult) (pediatric): Secondary | ICD-10-CM | POA: Diagnosis present

## 2015-09-17 DIAGNOSIS — Z961 Presence of intraocular lens: Secondary | ICD-10-CM | POA: Diagnosis present

## 2015-09-17 DIAGNOSIS — Z6834 Body mass index (BMI) 34.0-34.9, adult: Secondary | ICD-10-CM

## 2015-09-17 DIAGNOSIS — E785 Hyperlipidemia, unspecified: Secondary | ICD-10-CM | POA: Diagnosis present

## 2015-09-17 NOTE — ED Notes (Signed)
Bed: WA06 Expected date:  Expected time:  Means of arrival:  Comments: 80 yo F  Failure to thrive

## 2015-09-17 NOTE — ED Notes (Signed)
Pt presents from home by EMS.  Pt had a neighbor call 911 stating someone needed to check on her and she had just gotten out of the hospital Friday for dehydration and was also recently there for pneumonia.  Neighbor stated adult protective services needed to be involved. Pt was found on the floor laying on pillows and told EMS to leave and bring her a 2 liter coke back because she was thirsty.  Pt was saturated in urine.  HCPOA is out of town at this time and states he has tried to be in touch with adult protective services but has had little progress.  Pt uses a wheelchair at home and oxygen at night.   HCPOA information- not family:  [name] [phone]  In field: CBG 171; 90% RA; 122/67 BP

## 2015-09-18 ENCOUNTER — Other Ambulatory Visit: Payer: Self-pay

## 2015-09-18 ENCOUNTER — Encounter (HOSPITAL_COMMUNITY): Payer: Self-pay | Admitting: Emergency Medicine

## 2015-09-18 ENCOUNTER — Emergency Department (HOSPITAL_COMMUNITY): Payer: PPO

## 2015-09-18 DIAGNOSIS — M479 Spondylosis, unspecified: Secondary | ICD-10-CM | POA: Insufficient documentation

## 2015-09-18 DIAGNOSIS — A419 Sepsis, unspecified organism: Secondary | ICD-10-CM | POA: Diagnosis present

## 2015-09-18 DIAGNOSIS — F419 Anxiety disorder, unspecified: Secondary | ICD-10-CM | POA: Insufficient documentation

## 2015-09-18 DIAGNOSIS — E46 Unspecified protein-calorie malnutrition: Secondary | ICD-10-CM | POA: Diagnosis present

## 2015-09-18 DIAGNOSIS — M24569 Contracture, unspecified knee: Secondary | ICD-10-CM | POA: Insufficient documentation

## 2015-09-18 DIAGNOSIS — Z86010 Personal history of colonic polyps: Secondary | ICD-10-CM | POA: Insufficient documentation

## 2015-09-18 DIAGNOSIS — Z9229 Personal history of other drug therapy: Secondary | ICD-10-CM | POA: Insufficient documentation

## 2015-09-18 DIAGNOSIS — I959 Hypotension, unspecified: Secondary | ICD-10-CM | POA: Diagnosis present

## 2015-09-18 DIAGNOSIS — I5030 Unspecified diastolic (congestive) heart failure: Secondary | ICD-10-CM | POA: Diagnosis present

## 2015-09-18 DIAGNOSIS — I779 Disorder of arteries and arterioles, unspecified: Secondary | ICD-10-CM | POA: Insufficient documentation

## 2015-09-18 DIAGNOSIS — N852 Hypertrophy of uterus: Secondary | ICD-10-CM | POA: Insufficient documentation

## 2015-09-18 DIAGNOSIS — E860 Dehydration: Secondary | ICD-10-CM | POA: Diagnosis not present

## 2015-09-18 DIAGNOSIS — R748 Abnormal levels of other serum enzymes: Secondary | ICD-10-CM | POA: Diagnosis present

## 2015-09-18 DIAGNOSIS — E876 Hypokalemia: Secondary | ICD-10-CM | POA: Diagnosis present

## 2015-09-18 DIAGNOSIS — R627 Adult failure to thrive: Secondary | ICD-10-CM | POA: Diagnosis present

## 2015-09-18 DIAGNOSIS — F410 Panic disorder [episodic paroxysmal anxiety] without agoraphobia: Secondary | ICD-10-CM | POA: Diagnosis present

## 2015-09-18 DIAGNOSIS — G4733 Obstructive sleep apnea (adult) (pediatric): Secondary | ICD-10-CM | POA: Diagnosis present

## 2015-09-18 DIAGNOSIS — Z9981 Dependence on supplemental oxygen: Secondary | ICD-10-CM | POA: Diagnosis not present

## 2015-09-18 DIAGNOSIS — Z8572 Personal history of non-Hodgkin lymphomas: Secondary | ICD-10-CM | POA: Diagnosis not present

## 2015-09-18 DIAGNOSIS — Z993 Dependence on wheelchair: Secondary | ICD-10-CM | POA: Diagnosis not present

## 2015-09-18 DIAGNOSIS — R6520 Severe sepsis without septic shock: Secondary | ICD-10-CM | POA: Diagnosis present

## 2015-09-18 DIAGNOSIS — I2510 Atherosclerotic heart disease of native coronary artery without angina pectoris: Secondary | ICD-10-CM | POA: Diagnosis present

## 2015-09-18 DIAGNOSIS — F329 Major depressive disorder, single episode, unspecified: Secondary | ICD-10-CM | POA: Diagnosis present

## 2015-09-18 DIAGNOSIS — R739 Hyperglycemia, unspecified: Secondary | ICD-10-CM | POA: Diagnosis present

## 2015-09-18 DIAGNOSIS — K219 Gastro-esophageal reflux disease without esophagitis: Secondary | ICD-10-CM | POA: Diagnosis present

## 2015-09-18 DIAGNOSIS — I739 Peripheral vascular disease, unspecified: Secondary | ICD-10-CM

## 2015-09-18 DIAGNOSIS — I6523 Occlusion and stenosis of bilateral carotid arteries: Secondary | ICD-10-CM | POA: Diagnosis present

## 2015-09-18 DIAGNOSIS — Z6834 Body mass index (BMI) 34.0-34.9, adult: Secondary | ICD-10-CM | POA: Diagnosis not present

## 2015-09-18 DIAGNOSIS — E785 Hyperlipidemia, unspecified: Secondary | ICD-10-CM | POA: Diagnosis present

## 2015-09-18 DIAGNOSIS — I110 Hypertensive heart disease with heart failure: Secondary | ICD-10-CM | POA: Diagnosis present

## 2015-09-18 DIAGNOSIS — R942 Abnormal results of pulmonary function studies: Secondary | ICD-10-CM | POA: Insufficient documentation

## 2015-09-18 DIAGNOSIS — I5032 Chronic diastolic (congestive) heart failure: Secondary | ICD-10-CM | POA: Diagnosis present

## 2015-09-18 DIAGNOSIS — C8330 Diffuse large B-cell lymphoma, unspecified site: Secondary | ICD-10-CM | POA: Diagnosis present

## 2015-09-18 LAB — I-STAT CG4 LACTIC ACID, ED: LACTIC ACID, VENOUS: 1.93 mmol/L — AB (ref 0.5–1.9)

## 2015-09-18 LAB — COMPREHENSIVE METABOLIC PANEL
ALBUMIN: 3.2 g/dL — AB (ref 3.5–5.0)
ALT: 41 U/L (ref 14–54)
AST: 40 U/L (ref 15–41)
Alkaline Phosphatase: 178 U/L — ABNORMAL HIGH (ref 38–126)
Anion gap: 10 (ref 5–15)
BUN: 14 mg/dL (ref 6–20)
CHLORIDE: 107 mmol/L (ref 101–111)
CO2: 20 mmol/L — AB (ref 22–32)
CREATININE: 0.81 mg/dL (ref 0.44–1.00)
Calcium: 8.5 mg/dL — ABNORMAL LOW (ref 8.9–10.3)
GFR calc Af Amer: >60 mL/min (ref >60)
GLUCOSE: 134 mg/dL — AB (ref 65–99)
POTASSIUM: 3.3 mmol/L — AB (ref 3.5–5.1)
Sodium: 137 mmol/L (ref 135–145)
Total Bilirubin: 1 mg/dL (ref 0.3–1.2)
Total Protein: 6.3 g/dL — ABNORMAL LOW (ref 6.5–8.1)

## 2015-09-18 LAB — URINALYSIS, ROUTINE W REFLEX MICROSCOPIC
GLUCOSE, UA: NEGATIVE mg/dL
KETONES UR: NEGATIVE mg/dL
NITRITE: NEGATIVE
PH: 5.5 (ref 5.0–8.0)
PROTEIN: NEGATIVE mg/dL
Specific Gravity, Urine: 1.022 (ref 1.005–1.030)

## 2015-09-18 LAB — CBC WITH DIFFERENTIAL/PLATELET
BASOS PCT: 0 %
Basophils Absolute: 0 10*3/uL (ref 0.0–0.1)
EOS PCT: 0 %
Eosinophils Absolute: 0 10*3/uL (ref 0.0–0.7)
HEMATOCRIT: 38.8 % (ref 36.0–46.0)
Hemoglobin: 12.5 g/dL (ref 12.0–15.0)
LYMPHS ABS: 1.3 10*3/uL (ref 0.7–4.0)
LYMPHS PCT: 5 %
MCH: 26.9 pg (ref 26.0–34.0)
MCHC: 32.2 g/dL (ref 30.0–36.0)
MCV: 83.4 fL (ref 78.0–100.0)
MONOS PCT: 6 %
Monocytes Absolute: 1.5 10*3/uL — ABNORMAL HIGH (ref 0.1–1.0)
NEUTROS ABS: 22.4 10*3/uL — AB (ref 1.7–7.7)
Neutrophils Relative %: 89 %
PLATELETS: 268 10*3/uL (ref 150–400)
RBC: 4.65 MIL/uL (ref 3.87–5.11)
RDW: 18 % — AB (ref 11.5–15.5)
WBC: 25.2 10*3/uL — ABNORMAL HIGH (ref 4.0–10.5)

## 2015-09-18 LAB — URINE MICROSCOPIC-ADD ON: BACTERIA UA: NONE SEEN

## 2015-09-18 MED ORDER — PIPERACILLIN-TAZOBACTAM 3.375 G IVPB
3.3750 g | Freq: Three times a day (TID) | INTRAVENOUS | Status: DC
  Administered 2015-09-18 – 2015-09-19 (×3): 3.375 g via INTRAVENOUS
  Filled 2015-09-18 (×4): qty 50

## 2015-09-18 MED ORDER — ONDANSETRON HCL 4 MG PO TABS: 4.0000 mg | ORAL_TABLET | Freq: Four times a day (QID) | ORAL | Status: DC | PRN

## 2015-09-18 MED ORDER — VANCOMYCIN HCL IN DEXTROSE 1-5 GM/200ML-% IV SOLN: 1000.0000 mg | Freq: Once | INTRAVENOUS | Status: DC

## 2015-09-18 MED ORDER — SODIUM CHLORIDE 0.9 % IV BOLUS (SEPSIS)
1000.0000 mL | Freq: Once | INTRAVENOUS | Status: AC
  Administered 2015-09-18: 1000 mL via INTRAVENOUS

## 2015-09-18 MED ORDER — SODIUM CHLORIDE 0.9% FLUSH
3.0000 mL | Freq: Two times a day (BID) | INTRAVENOUS | Status: DC
  Administered 2015-09-18: 3 mL via INTRAVENOUS

## 2015-09-18 MED ORDER — PIPERACILLIN-TAZOBACTAM 3.375 G IVPB 30 MIN: 3.3750 g | Freq: Once | INTRAVENOUS | Status: DC

## 2015-09-18 MED ORDER — POTASSIUM CHLORIDE CRYS ER 20 MEQ PO TBCR
40.0000 meq | EXTENDED_RELEASE_TABLET | Freq: Once | ORAL | Status: AC
  Administered 2015-09-18: 40 meq via ORAL
  Filled 2015-09-18: qty 2

## 2015-09-18 MED ORDER — ONDANSETRON HCL 4 MG/2ML IJ SOLN: 4.0000 mg | Freq: Four times a day (QID) | INTRAMUSCULAR | Status: DC | PRN

## 2015-09-18 MED ORDER — PIPERACILLIN-TAZOBACTAM 3.375 G IVPB
3.3750 g | Freq: Once | INTRAVENOUS | Status: AC
  Administered 2015-09-18: 3.375 g via INTRAVENOUS
  Filled 2015-09-18: qty 50

## 2015-09-18 MED ORDER — ACETAMINOPHEN 650 MG RE SUPP: 650.0000 mg | Freq: Four times a day (QID) | RECTAL | Status: DC | PRN

## 2015-09-18 MED ORDER — ATORVASTATIN CALCIUM 40 MG PO TABS
80.0000 mg | ORAL_TABLET | Freq: Every day | ORAL | Status: DC
  Administered 2015-09-18 – 2015-09-19 (×2): 80 mg via ORAL
  Filled 2015-09-18 (×2): qty 2

## 2015-09-18 MED ORDER — ENOXAPARIN SODIUM 40 MG/0.4ML ~~LOC~~ SOLN
40.0000 mg | SUBCUTANEOUS | Status: DC
  Administered 2015-09-18 – 2015-09-20 (×3): 40 mg via SUBCUTANEOUS
  Filled 2015-09-18 (×3): qty 0.4

## 2015-09-18 MED ORDER — DOCUSATE SODIUM 100 MG PO CAPS
100.0000 mg | ORAL_CAPSULE | Freq: Two times a day (BID) | ORAL | Status: DC
  Administered 2015-09-18 – 2015-09-20 (×6): 100 mg via ORAL
  Filled 2015-09-18 (×6): qty 1

## 2015-09-18 MED ORDER — VANCOMYCIN HCL IN DEXTROSE 1-5 GM/200ML-% IV SOLN
1000.0000 mg | Freq: Once | INTRAVENOUS | Status: AC
  Administered 2015-09-18: 1000 mg via INTRAVENOUS
  Filled 2015-09-18: qty 200

## 2015-09-18 MED ORDER — ENSURE ENLIVE PO LIQD
237.0000 mL | Freq: Two times a day (BID) | ORAL | Status: DC
  Administered 2015-09-18 (×2): 237 mL via ORAL

## 2015-09-18 MED ORDER — POTASSIUM CHLORIDE IN NACL 20-0.9 MEQ/L-% IV SOLN
INTRAVENOUS | Status: DC
  Administered 2015-09-18 – 2015-09-19 (×2): via INTRAVENOUS
  Filled 2015-09-18 (×2): qty 1000

## 2015-09-18 MED ORDER — ACETAMINOPHEN 325 MG PO TABS: 650.0000 mg | ORAL_TABLET | Freq: Four times a day (QID) | ORAL | Status: DC | PRN

## 2015-09-18 MED ORDER — AMITRIPTYLINE HCL 25 MG PO TABS
100.0000 mg | ORAL_TABLET | Freq: Every day | ORAL | Status: DC
  Administered 2015-09-18 – 2015-09-19 (×2): 100 mg via ORAL
  Filled 2015-09-18 (×2): qty 4

## 2015-09-18 MED ORDER — POLYETHYLENE GLYCOL 3350 17 G PO PACK: 17.0000 g | PACK | Freq: Two times a day (BID) | ORAL | Status: DC | PRN

## 2015-09-18 MED ORDER — FLUCONAZOLE 100 MG PO TABS
100.0000 mg | ORAL_TABLET | Freq: Every day | ORAL | Status: AC
  Administered 2015-09-18 – 2015-09-20 (×3): 100 mg via ORAL
  Filled 2015-09-18 (×3): qty 1

## 2015-09-18 MED ORDER — LORAZEPAM 1 MG PO TABS
2.0000 mg | ORAL_TABLET | Freq: Two times a day (BID) | ORAL | Status: DC
  Administered 2015-09-18: 2 mg via ORAL
  Filled 2015-09-18: qty 2

## 2015-09-18 MED ORDER — ASPIRIN EC 81 MG PO TBEC
81.0000 mg | DELAYED_RELEASE_TABLET | Freq: Every morning | ORAL | Status: DC
  Administered 2015-09-18 – 2015-09-20 (×3): 81 mg via ORAL
  Filled 2015-09-18 (×3): qty 1

## 2015-09-18 MED ORDER — LORAZEPAM 1 MG PO TABS
1.0000 mg | ORAL_TABLET | Freq: Two times a day (BID) | ORAL | Status: DC
  Administered 2015-09-18: 1 mg via ORAL
  Filled 2015-09-18: qty 1

## 2015-09-18 MED ORDER — VANCOMYCIN HCL IN DEXTROSE 750-5 MG/150ML-% IV SOLN
750.0000 mg | Freq: Two times a day (BID) | INTRAVENOUS | Status: DC
  Administered 2015-09-18 (×2): 750 mg via INTRAVENOUS
  Filled 2015-09-18 (×3): qty 150

## 2015-09-18 NOTE — Progress Notes (Signed)
09/17/2015 11:58 PM  09/18/2015 12:24 PM  [name] was seen and examined.  The H&P by the admitting provider , orders, imaging was reviewed.  Please see orders.  Will continue to follow.   Murvin Natal, MD [facility]

## 2015-09-18 NOTE — H&P (Signed)
History and Physical    [name] XHB:[id number] DOB: Jul 10, 1935 DOA: 09/17/2015  PCP:  Melinda Crutch, MD Consultants:  Stanford Breed - cardiology; Young - pulmonary; [facility] - onc; [facility] - ENT Patient coming from: home - lives alone; NOK: Horace Porteous, friend, [phone]  Chief Complaint: fall?  HPI: [name] is a 81 y.o. female with medical history significant of NHL and breast cancer with recent (7/3-7/17/17) admission for septic shock from [facility].  She was discharged to SNF and patient has already transitioned to home.  She reports someone called EMS because they hadn't seen her in a few days and they were concerned about her.  She thinks this is ridiculous.  She uses a wheelchair and has been home.  She thinks she might have slept on the couch the last night or two.  Hasn't had anything to drink the next few days because no one was able to bring it to her.  Slid out of the wheelchair and had difficulty getting back up.  Sore throat, not feeling good.  Otherwise denies any symptoms of infection.  She saw Dr. Marin Olp on 7/28 and he is concerned about possible relapse of her diffuse large cell NHL but is unconvinced after repeat PET scan.  His plan is to repeat the PET in Oct.  Meanwhile, she also has a h/o endometrial thickening and is supposed to have a D&C for further evaluation.   ED Course: Febrile to 101.9; initial BP 93 systolic, normal lactate; sepsis work up initiated.  WBC count 25, tachycardia.  30cc/kg bolus, Vanc/Zosyn given.  No obvious source - neg CXR and UA, on home O2.  Concern about patient caring for herself at home.  Review of Systems: As per HPI; otherwise 10 point review of systems reviewed and negative.   Ambulatory Status:  Uses wheelchair  Past Medical History:  Diagnosis Date  . Anxiety    takes Ativan daily  . Bilateral carotid artery stenosis    per last duplex --  RICA and LICA  85-27-%  . CAD (coronary artery disease)    cardiologist-  dr Stanford Breed  .  Chronic chest pain   . Chronic headache   . Depression    takes Elavil nightly  . Diffuse large cell non-Hodgkin's lymphoma [facility]) oncologist-  dr Marin Olp-  per last office note being monitored for possible recurrence   dx 03/ 2015 by lymph node bx/  Stage 3-  completed chemotherapy  . Dyspnea   . GERD (gastroesophageal reflux disease)    takes Nexium daily  . Hiatal hernia   . History of breast cancer oncologist-  dr Marin Olp--  no recurrence   dx 2002-- right breast Stage 1/2 lobular,  ER+/HER2 negative /  s/p  right mastectomy w/ node dissection and chemotherapy  . History of colon polyps    hyperplastic polyp's  2003;  2006;  2011  . History of colon polyps    hyperplastic 2003,  2006,  2011  . History of panic attacks   . History of small bowel obstruction    08/ 2014  parital sbo  resolved without surgical intervention  . Hyperlipidemia    takes Atorvastatin daily  . Hypertension    takes Quinapril daily  . Low back pain   . OSA (obstructive sleep apnea)    mild per study 10-14-2007-  cpap intolerent, uses O2 supplement at night  . Osteoarthritis   . PMB (postmenopausal bleeding)   . S/P drug eluting coronary stent placement  pLAD 07-09-2009  . Thickened endometrium     Past Surgical History:  Procedure Laterality Date  . CARDIAC CATHETERIZATION  07-15-2010   dr Kathlyn [place]   no evidence obstructive cad/  patent LAD stent with no significant restenosis/  normal LVF  . CARDIOVASCULAR STRESS TEST  12-24-2009   normal nuclear study w/ no ischemia/  normal LV function and wall motion, ef 72%  . CATARACT EXTRACTION W/ INTRAOCULAR LENS  IMPLANT, BILATERAL  2011  . CHOLECYSTECTOMY  2005  . COLONOSCOPY WITH ESOPHAGOGASTRODUODENOSCOPY (EGD)  11-12-2014   polypectomy /  normal egd  . CORONARY ANGIOPLASTY WITH STENT PLACEMENT  07-09-2009  dr Darnell Level brodie   PCI and DES x1 to  pLAD/  normal LVF, ef 60%  . INNER EAR SURGERY    . KNEE ARTHROSCOPY Right   . MASTECTOMY WITH  AXILLARY LYMPH NODE DISSECTION Right 01-03-2001  . PORTACATH PLACEMENT  06/04/13   and removal of previous pac  . SUBMANDIBULAR GLAND EXCISION Left 05/10/2013   Procedure: EXCISION LEFT SUBMANDIBULAR NODE;  Surgeon: Jodi Marble, MD;  Location: [facility];  Service: ENT;  Laterality: Left;  . TONSILLECTOMY    . TRANSTHORACIC ECHOCARDIOGRAM  10-04-2006   ef 55-605/  trivial MR and TR/  mild AV sclerosis without stenosis (valve area 1.93cm^2)    Social History   Social History  . Marital status: Divorced    Spouse name: N/A  . Number of children: 2  . Years of education: N/A   Occupational History  . Unemployed Retired   Social History Main Topics  . Smoking status: Never Smoker  . Smokeless tobacco: Never Used  . Alcohol use No  . Drug use: No  . Sexual activity: Yes    Birth control/ protection: Post-menopausal   Other Topics Concern  . Not on file   Social History Narrative  . No narrative on file    No Known Allergies  History reviewed. No pertinent family history.  Prior to Admission medications   Medication Sig Start Date End Date Taking? Authorizing Provider  acetaminophen (TYLENOL) 500 MG tablet Take 500-1,000 mg by mouth every 6 (six) hours as needed (for pain.).   Yes Historical Provider, MD  amitriptyline (ELAVIL) 100 MG tablet Take 100 mg by mouth at bedtime.   Yes Historical Provider, MD  aspirin 81 MG EC tablet Take 81 mg by mouth every morning.    Yes Historical Provider, MD  atorvastatin (LIPITOR) 80 MG tablet Take 1 tablet (80 mg total) by mouth daily. 06/02/15  Yes Lelon Perla, MD  cholecalciferol (VITAMIN D) 1000 UNITS tablet Take 1,000 Units by mouth every morning.    Yes Historical Provider, MD  Cinnamon 500 MG TABS Take 1,000 mg by mouth every morning.    Yes Historical Provider, MD  GARLIC PO Take 1 tablet by mouth daily.   Yes Historical Provider, MD  LORazepam (ATIVAN) 2 MG tablet Take 1 tablet by mouth 2 (two) times daily. 09/12/15  Yes  Historical Provider, MD  multivitamin-iron-minerals-folic acid (CENTRUM) chewable tablet Chew 1 tablet by mouth every morning.    Yes Historical Provider, MD  nitroGLYCERIN (NITROSTAT) 0.4 MG SL tablet Place 1 tablet (0.4 mg total) under the tongue every 5 (five) minutes as needed for chest pain. 08/28/14  Yes Lelon Perla, MD  Omega-3 Fatty Acids (FISH OIL) 1000 MG CAPS Take 1,000 mg by mouth every morning.    Yes Historical Provider, MD  polyethylene glycol powder (GLYCOLAX/MIRALAX) powder Take 17 g  by mouth 2 (two) times daily as needed. Patient taking differently: Take 17 g by mouth 2 (two) times daily as needed for mild constipation.  05/21/14  Yes Posey Boyer, MD  LORazepam (ATIVAN) 0.5 MG tablet Take 1 tablet (0.5 mg total) by mouth 2 (two) times daily as needed for anxiety. Patient not taking: Reported on 09/18/2015 09/01/15   Debbe Odea, MD  senna-docusate (SENOKOT-S) 8.6-50 MG tablet Take 2 tablets by mouth 2 (two) times daily. Patient not taking: Reported on 09/18/2015 09/01/15   Debbe Odea, MD    Physical Exam: Vitals:   09/18/15 0155 09/18/15 0200 09/18/15 0250 09/18/15 0306  BP:  122/58  (!) 120/48  Pulse:  104 105 (!) 101  Resp:  (!) 31 (!) 27 (!) 22  Temp:   101.2 F (38.4 C) 98.7 F (37.1 C)  TempSrc:   Rectal Oral  SpO2:  (!) 89% 96% 96%  Weight: 104.3 kg (230 lb)   93 kg (205 lb 0.4 oz)  Height: _0  (1.651 m)   _1  (1.651 m)     General: Appears calm and comfortable and is NAD; disheveled, poor dentition Eyes:  PERRL, EOMI, normal lids, iris ENT:  grossly normal hearing, lips & tongue, dry mm Neck:  no LAD, masses or thyromegaly Cardiovascular:  RRR, no m/r/g. No LE edema.  Respiratory:  CTA bilaterally, no w/r/r. Normal respiratory effort. Abdomen:  soft, ntnd, NABS Skin:  no rash or induration seen on limited exam other than an apparently chronic plaque on the R anterior shin Musculoskeletal:  grossly normal tone BUE/BLE, good ROM, no bony  abnormality Psychiatric:  grossly normal mood and affect, speech fluent and appropriate, AOx3 Neurologic:  CN 2-12 grossly intact, moves all extremities in coordinated fashion, sensation intact  Labs on Admission: I have personally reviewed following labs and imaging studies  CBC:  Recent Labs Lab 09/12/15 1319 09/18/15 0048  WBC 13.4* 25.2*  NEUTROABS 9.2* 22.4*  HGB 12.8 12.5  HCT 39.0 38.8  MCV 85 83.4  PLT 406* 226   Basic Metabolic Panel:  Recent Labs Lab 09/12/15 1320 09/18/15 0048  NA 138 137  K 4.0 3.3*  CL  --  107  CO2 18* 20*  GLUCOSE 122 134*  BUN 11.8 14  CREATININE 0.8 0.81  CALCIUM 9.5 8.5*   GFR: Estimated Creatinine Clearance (by C-G formula based on SCr of 0.81 mg/dL) Female: 64.5 mL/min Female: 78.8 mL/min Liver Function Tests:  Recent Labs Lab 09/12/15 1320 09/18/15 0048  AST 51* 40  ALT 75* 41  ALKPHOS 230* 178*  BILITOT 0.58 1.0  PROT 6.4 6.3*  ALBUMIN 2.9* 3.2*   No results for input(s): LIPASE, AMYLASE in the last 168 hours. No results for input(s): AMMONIA in the last 168 hours. Coagulation Profile: No results for input(s): INR, PROTIME in the last 168 hours. Cardiac Enzymes: No results for input(s): CKTOTAL, CKMB, CKMBINDEX, TROPONINI in the last 168 hours. BNP (last 3 results) No results for input(s): PROBNP in the last 8760 hours. HbA1C: No results for input(s): HGBA1C in the last 72 hours. CBG: No results for input(s): GLUCAP in the last 168 hours. Lipid Profile: No results for input(s): CHOL, HDL, LDLCALC, TRIG, CHOLHDL, LDLDIRECT in the last 72 hours. Thyroid Function Tests: No results for input(s): TSH, T4TOTAL, FREET4, T3FREE, THYROIDAB in the last 72 hours. Anemia Panel: No results for input(s): VITAMINB12, FOLATE, FERRITIN, TIBC, IRON, RETICCTPCT in the last 72 hours. Urine analysis:    Component Value  Date/Time   COLORURINE AMBER (A) 09/18/2015 0128   APPEARANCEUR TURBID (A) 09/18/2015 0128   LABSPEC 1.022  09/18/2015 0128   PHURINE 5.5 09/18/2015 0128   GLUCOSEU NEGATIVE 09/18/2015 0128   HGBUR TRACE (A) 09/18/2015 0128   BILIRUBINUR MODERATE (A) 09/18/2015 0128   KETONESUR NEGATIVE 09/18/2015 0128   PROTEINUR NEGATIVE 09/18/2015 0128   UROBILINOGEN 0.2 09/19/2012 1438   NITRITE NEGATIVE 09/18/2015 0128   LEUKOCYTESUR SMALL (A) 09/18/2015 0128    Creatinine Clearance: Estimated Creatinine Clearance (by C-G formula based on SCr of 0.81 mg/dL) Female: 64.5 mL/min Female: 78.8 mL/min  Sepsis Labs: _0 (procalcitonin:4,lacticidven:4) )No results found for this or any previous visit (from the past 240 hour(s)).   Radiological Exams on Admission: Dg Chest Port 1 View  Result Date: 09/18/2015 CLINICAL DATA:  Sepsis. Shortness of breath. Found on floor in home. EXAM: PORTABLE CHEST 1 VIEW COMPARISON:  PET-CT 09/08/2015, chest radiographs 08/31/2015 FINDINGS: Patient is rotated. Unchanged cardiomegaly. Interstitial changes are seen, with mild decrease from prior radiographs suggesting improving edema. Small pleural effusions are similar. Bibasilar opacities are also similar. No new focal airspace disease. No pneumothorax. Degenerative change in the spine. IMPRESSION: Unchanged cardiomegaly. Improved interstitial changes suggesting improving pulmonary edema. Pleural effusions and bibasilar opacities are similar. Electronically Signed   By: Jeb Levering M.D.   On: 09/18/2015 01:12    EKG: Independently reviewed.  Sinus tachycardia with rate 108; no evidence of acute ischemia  Assessment/Plan Principal Problem:   Sepsis ([place]) Active Problems:   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 -   History of B-cell lymphoma   Leukocytosis   Wheelchair dependent   Hypokalemia   Hyperglycemia   Protein-calorie malnutrition (HCC)   Elevated alkaline phosphatase level   (HFpEF) heart failure with preserved ejection fraction (HCC)   Sepsis of unknown  source -Recent admission with septic shock, patient currently with multiple SIRS criteria including fever, tachycardia, leukocytosis.   -Low BP on presentation, since improved.   -Slightly elevated lactate. -Blood and urine cultures collected in ER and pending. -Started on empiric Vanc and Zosyn, which will be continued. -Given 30 cc/kg bolus - will need to monitor for fluid overload, although patient still looks dry. -Will admit to telemetry floor and continue to monitor.  Wheelchair dependent -Patient with recent hospitalization and d/c to SNF; as of 7/28 was still in facility according to Dr. Antonieta Pert note -Now patient is back home and has already not been seen in several days, been unable to obtain water, and was reportedly found down in the floor (patient denies this). -Has reported prior APS referrals due to concerns about her living independently -Agree that her housing situation is worrisome -Will request SW consult for possible placement  Elevated alk phos -CT Abd on 7/13 showed "interval development of stranding in the mesenteric root and increased prominence of mesenteric adenopathy.  Considerations include lymphoproliferative disease, infection, inflammation." -In conjunction with elevated alk phos, this may be a consideration about source for infection -Consider repeat CT abd based on clinical progress over next 12- 24 hours.  Hypokalemia -Will replete and recheck in AM  Hyperglycemia -Intermittent h/o elevated glucose in the past -Last A1c appears to be in 2012 -Will recheck  Malnutrition -Low albumin -Nutrition consult  HFpEF -Echo in 7/17 showed preserved EF with grade 1 diastolic dysfunction -Received IVF bolus for sepsis in ER and does appear to be dry -Had pulmonary edema on prior imaging, appears improved on tonight's imaging -Will closely  monitor   DVT prophylaxis: Lovenox  Code Status: Full - confirmed with patient Family Communication: None  present Disposition Plan: To be determined Consults called: SW, nutrition Admission status: Admit to telemetry   Karmen Bongo MD [facility]  If 7PM-7AM, please contact night-coverage [email or web address] Password [email or web address]  09/18/2015, 3:15 AM

## 2015-09-18 NOTE — ED Notes (Signed)
Hospitalist at bedside 

## 2015-09-18 NOTE — Evaluation (Signed)
Physical Therapy Evaluation Patient Details Name: [name] MRN: [id number] DOB: [birth year] Today's Date: 09/18/2015   History of Present Illness  80 yo female admitted with sepsis. Hx of NHL, breast cancer, CAD.   Clinical Impression  On eval, pt required Max-Total assist for mobility. Pt participated with Mod encouragement from therapist. Pt is weak. She cannot perform any mobility tasks without extensive assistance at this time. Pt denied being found on floor at home during PT session. Do not feel pt can safely manage at home at current level. Recommend SNF. May need to consider long-term placement at some point in near future.    Follow Up Recommendations SNF    Equipment Recommendations  None recommended by PT    Recommendations for Other Services OT consult     Precautions / Restrictions Precautions Precautions: Fall Precaution Comments: knees are contracted Restrictions Weight Bearing Restrictions: No      Mobility  Bed Mobility Overal bed mobility: Needs Assistance Bed Mobility: Rolling;Sidelying to Sit;Sit to Sidelying Rolling: Max assist Sidelying to sit: Max assist     Sit to sidelying: Max assist General bed mobility comments: HOB ~60 degrees. Assist for trunk and bil LEs. Multimodal cueing. Increased time. Utilized bedpad to aid with positioning.   Transfers Overall transfer level: Needs assistance   Transfers: Lateral/Scoot Transfers          Lateral/Scoot Transfers: Total assist General transfer comment: Total assist to perform 3-4 small lateral scoots towards HOB. Utilized bedpad to aid with scooting.   Ambulation/Gait                Stairs            Wheelchair Mobility    Modified Rankin (Stroke Patients Only)       Balance Overall balance assessment: Needs assistance;History of Falls Sitting-balance support: Feet supported;Bilateral upper extremity supported Sitting balance-Leahy Scale: Fair                                        Pertinent Vitals/Pain Pain Assessment: Faces Faces Pain Scale: Hurts little more Pain Location: "arthritis" generalized pain in multiple joints Pain Intervention(s): Limited activity within patient's tolerance;Monitored during session    [place] expects to be discharged to:: Unsure Living Arrangements: Alone Available Help at Discharge: Personal care attendant;Neighbor;Available PRN/intermittently (2 days/week for 4 hours) Type of Home: Apartment Home Access: Level entry     Home Layout: One level Home Equipment: Walker - 2 wheels;Walker - 4 wheels;Bedside commode;Wheelchair - manual      Prior Function Level of Independence: Needs assistance   Gait / Transfers Assistance Needed: transfers to/from wheelchair.     Comments: question how patient was able to care for herself     Hand Dominance        Extremity/Trunk Assessment                 RLE Deficits / Details: knee ext ~60 degrees from 0. "windswept" position in bed LLE Deficits / Details: same as right  Cervical / Trunk Assessment: Kyphotic  Communication   Communication: No difficulties  Cognition Arousal/Alertness: Awake/alert Behavior During Therapy: WFL for tasks assessed/performed Overall Cognitive Status: No family/caregiver present to determine baseline cognitive functioning Area of Impairment: Safety/judgement; Disoriented to time/day         Safety/Judgement: Decreased awareness of deficits;Decreased awareness of safety  General Comments      Exercises Total Joint Exercises Short Arc Quad: AROM;Both;10 reps;Seated      Assessment/Plan    PT Assessment Patient needs continued PT services  PT Diagnosis Generalized weakness   PT Problem List Decreased strength;Decreased range of motion;Decreased activity tolerance;Decreased balance;Decreased cognition;Decreased safety awareness;Decreased mobility;Pain;Decreased knowledge of use  of DME  PT Treatment Interventions DME instruction;Functional mobility training;Therapeutic exercise;Therapeutic activities;Patient/family education;Wheelchair mobility training   PT Goals (Current goals can be found in the Care Plan section) Acute Rehab PT Goals Patient Stated Goal: to go home PT Goal Formulation: With patient Time For Goal Achievement: 10/02/15 Potential to Achieve Goals: Fair    Frequency Min 3X/week   Barriers to discharge Decreased caregiver support      Co-evaluation               End of Session   Activity Tolerance: Patient tolerated treatment well Patient left: in bed;with call bell/phone within reach;with bed alarm set           Time: 1329-1350 PT Time Calculation (min) (ACUTE ONLY): 21 min   Charges:   PT Evaluation $PT Eval Moderate Complexity: 1 Procedure     PT G Codes:        Weston Anna, MPT Pager: [phone]

## 2015-09-18 NOTE — Progress Notes (Signed)
Pharmacy Antibiotic Note  Tolanda Mielcarek Biber is a 80 y.o. female admitted on 09/17/2015 with sepsis.  Pharmacy has been consulted for zosyn/vancomycin dosing.  Plan: Zosyn 3.375 Gm IV q8h EI Vancomycin 1Gm x1 then 750mg  IV q12h (VT=15-20mg /L)  Height: 5\' 5"  (165.1 cm) Weight: 205 lb 0.4 oz (93 kg) IBW/kg (Calculated) : 57  Temp (24hrs), Avg:100.6 F (38.1 C), Min:98.7 F (37.1 C), Max:101.9 F (38.8 C)   Recent Labs Lab 09/12/15 1319 09/12/15 1320 09/18/15 0048 09/18/15 0051  WBC 13.4*  --  25.2*  --   CREATININE  --  0.8 0.81  --   LATICACIDVEN  --   --   --  1.93*    Estimated Creatinine Clearance (by C-G formula based on SCr of 0.81 mg/dL) Female: 64.5 mL/min Female: 78.8 mL/min    No Known Allergies  Antimicrobials this admission: 8/3 zosyn >>  8/3 vancomycin >>   Dose adjustments this admission:   Microbiology results:  BCx:   UCx:    Sputum:    MRSA PCR:   Thank you for allowing pharmacy to be a part of this patient's care.  Dorrene German 09/18/2015 3:20 AM

## 2015-09-18 NOTE — ED Notes (Signed)
IV ultrasound at bedside per MD request.

## 2015-09-18 NOTE — ED Provider Notes (Signed)
[facility] DEPT Provider Note   CSN: [id number] Arrival date & time: 09/17/15  2351  First Provider Contact:  First MD Initiated Contact with Patient 09/18/15 0017     By signing my name below, I, Julien Nordmann, attest that this documentation has been prepared under the direction and in the presence of Merryl Hacker, MD.  Electronically Signed: Julien Nordmann, ED Scribe. 09/18/15. 12:28 AM.    History   Chief Complaint Chief Complaint  Patient presents with  . Failure To Thrive  . Possible UTI    The history is provided by the patient and the EMS personnel. No language interpreter was used.   HPI Comments: [name] is a 80 y.o. female brought in by ambulance, who has a pertinent medical hx presents to the Emergency Department presenting for failure to thrive. EMS notes pt was found laying on the floor saturated in urine. She notes she has not eaten or drank any fluids these past few days due to not having anyone bring her some. She was recently discharged from a nursing home facility a few days ago. Pt lives on her own and is wheelchair bound. Pt's HCPOA is out of town and notes that he has tried to be in touch with adult protective services but has not had any progress. She denies shortness of breath, cough, burning with urination, hematuria, vomiting, or diarrhea.  Pt was recently admitted to the hospital on 7/3 for septic shock and pneumonia.  She was discharged to skilled nursing facility but reports that she has been at home. She reports that she is thirsty as she is not had anybody to "bring her water." She denies falling or being on the floor when EMS got there but EMS reported that she was on the floor on pillows.  Past Medical History:  Diagnosis Date  . Anxiety    takes Ativan daily  . Bilateral carotid artery stenosis    per last duplex --  RICA and LICA  34-19-%  . CAD (coronary artery disease)    cardiologist-  dr Stanford Breed  . Chronic chest pain   .  Chronic headache   . Depression    takes Elavil nightly  . Diffuse large cell non-Hodgkin's lymphoma [facility]) oncologist-  dr Marin Olp-  per last office note being monitored for possible recurrence   dx 03/ 2015 by lymph node bx/  Stage 3-  completed chemotherapy  . Dyspnea   . GERD (gastroesophageal reflux disease)    takes Nexium daily  . Hiatal hernia   . History of breast cancer oncologist-  dr Marin Olp--  no recurrence   dx 2002-- right breast Stage 1/2 lobular,  ER+/HER2 negative /  s/p  right mastectomy w/ node dissection and chemotherapy  . History of colon polyps    hyperplastic polyp's  2003;  2006;  2011  . History of colon polyps    hyperplastic 2003,  2006,  2011  . History of panic attacks   . History of small bowel obstruction    08/ 2014  [facility]ital sbo  resolved without surgical intervention  . Hyperlipidemia    takes Atorvastatin daily  . Hypertension    takes Quinapril daily  . Low back pain   . OSA (obstructive sleep apnea)    mild per study 10-14-2007-  cpap intolerent, uses O2 supplement at night  . Osteoarthritis   . PMB (postmenopausal bleeding)   . S/P drug eluting coronary stent placement    pLAD 07-09-2009  .  Thickened endometrium     Patient Active Problem List   Diagnosis Date Noted  . Sepsis ([place]) 09/18/2015  . Septic shock ([place]) 09/01/2015  . Elevated LFTs 09/01/2015  . Leukocytosis 09/01/2015  . CAP (community acquired pneumonia) 09/01/2015  . Fluid overload 09/01/2015  . Wheelchair dependent 09/01/2015  . Morbid obesity ([place]) 09/01/2015  . Sleep apnea- declines CPAP 09/01/2015  . Endometrial thickening on ultra sound 09/01/2015  . Constipation by delayed colonic transit 09/01/2015  . Cerebrovascular disease 06/02/2015  . Abdominal pain 06/17/2014  . Bruit 06/07/2014  . Onychomycosis 05/21/2014  . Adnexal mass 01/17/2014  . Ovarian cancer screening 11/12/2013  . Port catheter in place 09/05/2013  . Elevated liver function tests 08/08/2013    . History of B-cell lymphoma 05/10/2013  . Delusional disorder ([place]) 02/15/2012  . EDEMA 10/23/2009  . Coronary atherosclerosis 08/11/2009  . ANXIETY 07/29/2008  . OSTEOARTHRITIS 07/29/2008  . DYSPNEA 10/24/2007  . Hyperlipidemia, mixed 09/22/2007  . ALLERGIC RHINITIS 09/22/2007  . 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 - 09/22/2007    Past Surgical History:  Procedure Laterality Date  . CARDIAC CATHETERIZATION  07-15-2010   dr Kathlyn [place]   no evidence obstructive cad/  patent LAD stent with no significant restenosis/  normal LVF  . CARDIOVASCULAR STRESS TEST  12-24-2009   normal nuclear study w/ no ischemia/  normal LV function and wall motion, ef 72%  . CATARACT EXTRACTION W/ INTRAOCULAR LENS  IMPLANT, BILATERAL  2011  . CHOLECYSTECTOMY  2005  . COLONOSCOPY WITH ESOPHAGOGASTRODUODENOSCOPY (EGD)  11-12-2014   polypectomy /  normal egd  . CORONARY ANGIOPLASTY WITH STENT PLACEMENT  07-09-2009  dr Darnell Level brodie   PCI and DES x1 to  pLAD/  normal LVF, ef 60%  . INNER EAR SURGERY    . KNEE ARTHROSCOPY Right   . MASTECTOMY WITH AXILLARY LYMPH NODE DISSECTION Right 01-03-2001  . PORTACATH PLACEMENT  06/04/13   and removal of previous pac  . SUBMANDIBULAR GLAND EXCISION Left 05/10/2013   Procedure: EXCISION LEFT SUBMANDIBULAR NODE;  Surgeon: Jodi Marble, MD;  Location: [facility];  Service: ENT;  Laterality: Left;  . TONSILLECTOMY    . TRANSTHORACIC ECHOCARDIOGRAM  10-04-2006   ef 55-605/  trivial MR and TR/  mild AV sclerosis without stenosis (valve area 1.93cm^2)    OB History    No data available       Home Medications    Prior to Admission medications   Medication Sig Start Date End Date Taking? Authorizing Provider  acetaminophen (TYLENOL) 500 MG tablet Take 500-1,000 mg by mouth every 6 (six) hours as needed (for pain.).   Yes Historical Provider, MD  amitriptyline (ELAVIL) 100 MG tablet Take 100 mg by mouth at bedtime.   Yes Historical  Provider, MD  aspirin 81 MG EC tablet Take 81 mg by mouth every morning.    Yes Historical Provider, MD  atorvastatin (LIPITOR) 80 MG tablet Take 1 tablet (80 mg total) by mouth daily. 06/02/15  Yes Lelon Perla, MD  cholecalciferol (VITAMIN D) 1000 UNITS tablet Take 1,000 Units by mouth every morning.    Yes Historical Provider, MD  Cinnamon 500 MG TABS Take 1,000 mg by mouth every morning.    Yes Historical Provider, MD  GARLIC PO Take 1 tablet by mouth daily.   Yes Historical Provider, MD  LORazepam (ATIVAN) 2 MG tablet Take 1 tablet by mouth 2 (two) times daily. 09/12/15  Yes Historical Provider, MD  multivitamin-iron-minerals-folic acid (CENTRUM) chewable tablet Chew 1 tablet by mouth every morning.    Yes Historical Provider, MD  nitroGLYCERIN (NITROSTAT) 0.4 MG SL tablet Place 1 tablet (0.4 mg total) under the tongue every 5 (five) minutes as needed for chest pain. 08/28/14  Yes Lelon Perla, MD  Omega-3 Fatty Acids (FISH OIL) 1000 MG CAPS Take 1,000 mg by mouth every morning.    Yes Historical Provider, MD  polyethylene glycol powder (GLYCOLAX/MIRALAX) powder Take 17 g by mouth 2 (two) times daily as needed. Patient taking differently: Take 17 g by mouth 2 (two) times daily as needed for mild constipation.  05/21/14  Yes Posey Boyer, MD  LORazepam (ATIVAN) 0.5 MG tablet Take 1 tablet (0.5 mg total) by mouth 2 (two) times daily as needed for anxiety. Patient not taking: Reported on 09/18/2015 09/01/15   Debbe Odea, MD  senna-docusate (SENOKOT-S) 8.6-50 MG tablet Take 2 tablets by mouth 2 (two) times daily. Patient not taking: Reported on 09/18/2015 09/01/15   Debbe Odea, MD    Family History History reviewed. No pertinent family history.  Social History Social History  Substance Use Topics  . Smoking status: Never Smoker  . Smokeless tobacco: Never Used  . Alcohol use No     Allergies   Review of patient's allergies indicates no known allergies.   Review of  Systems Review of Systems  Constitutional: Positive for appetite change and fever.  HENT: Positive for sore throat.   Respiratory: Negative for cough and shortness of breath.   Gastrointestinal: Negative for abdominal pain, diarrhea, nausea and vomiting.  Genitourinary: Negative for dysuria and hematuria.  All other systems reviewed and are negative.    Physical Exam Updated Vital Signs BP 122/58   Pulse 104   Temp 101.9 F (38.8 C) (Rectal)   Resp (!) 31   Ht _0  (1.651 m)   Wt 230 lb (104.3 kg)   SpO2 (!) 89%   BMI 38.27 kg/m   Physical Exam  Constitutional: She is oriented to person, place, and time. No distress.  Chronically ill-appearing, obese, disheveled  HENT:  Head: Normocephalic.  Mouth/Throat: Oropharynx is clear and moist.  Excoriations over the entire face, mucous membranes dry  Eyes: Pupils are equal, round, and reactive to light.  Neck: Normal range of motion. Neck supple.  Cardiovascular: Normal rate, regular rhythm and normal heart sounds.   No murmur heard. Pulmonary/Chest: Effort normal. No respiratory distress. She has no wheezes.  Distance breath sounds, no wheezing appreciated, on nasal cannula  Abdominal: Soft. Bowel sounds are normal. There is no tenderness. There is no guarding.  Musculoskeletal: She exhibits no edema.  Neurological: She is alert and oriented to person, place, and time.  Skin: Skin is warm and dry.  Small patch of erythema noted over the right shin, blanching  Psychiatric: She has a normal mood and affect.  Nursing note and vitals reviewed.    ED Treatments / Results  DIAGNOSTIC STUDIES: Oxygen Saturation is 91% on 2 L/min [place], low by my interpretation.  COORDINATION OF CARE:  12:23 AM Discussed treatment plan with pt at bedside and pt agreed to plan.  Labs (all labs ordered are listed, but only abnormal results are displayed) Labs Reviewed  COMPREHENSIVE METABOLIC PANEL - Abnormal; Notable for the following:        Result Value   Potassium 3.3 (*)    CO2 20 (*)    Glucose, Bld 134 (*)    Calcium 8.5 (*)  Total Protein 6.3 (*)    Albumin 3.2 (*)    Alkaline Phosphatase 178 (*)    All other components within normal limits  CBC WITH DIFFERENTIAL/PLATELET - Abnormal; Notable for the following:    WBC 25.2 (*)    RDW 18.0 (*)    Neutro Abs 22.4 (*)    Monocytes Absolute 1.5 (*)    All other components within normal limits  URINALYSIS, ROUTINE W REFLEX MICROSCOPIC (NOT AT [facility]) - Abnormal; Notable for the following:    Color, Urine AMBER (*)    APPearance TURBID (*)    Hgb urine dipstick TRACE (*)    Bilirubin Urine MODERATE (*)    Leukocytes, UA SMALL (*)    All other components within normal limits  URINE MICROSCOPIC-ADD ON - Abnormal; Notable for the following:    Squamous Epithelial / LPF 6-30 (*)    All other components within normal limits  I-STAT CG4 LACTIC ACID, ED - Abnormal; Notable for the following:    Lactic Acid, Venous 1.93 (*)    All other components within normal limits  CULTURE, BLOOD (ROUTINE X 2)  CULTURE, BLOOD (ROUTINE X 2)  URINE CULTURE  I-STAT CG4 LACTIC ACID, ED    EKG  EKG Interpretation  Date/Time:  Thursday September 18 2015 00:35:33 EDT Ventricular Rate:  108 PR Interval:    QRS Duration: 105 QT Interval:  356 QTC Calculation: 478 R Axis:   -15 Text Interpretation:  Sinus tachycardia Borderline left axis deviation Borderline prolonged QT interval Confirmed by Dina Rich  MD, COURTNEY ([id number]) on 09/18/2015 2:42:06 AM       Radiology Dg Chest Port 1 View  Result Date: 09/18/2015 CLINICAL DATA:  Sepsis. Shortness of breath. Found on floor in home. EXAM: PORTABLE CHEST 1 VIEW COMPARISON:  PET-CT 09/08/2015, chest radiographs 08/31/2015 FINDINGS: Patient is rotated. Unchanged cardiomegaly. Interstitial changes are seen, with mild decrease from prior radiographs suggesting improving edema. Small pleural effusions are similar. Bibasilar opacities are also similar. No  new focal airspace disease. No pneumothorax. Degenerative change in the spine. IMPRESSION: Unchanged cardiomegaly. Improved interstitial changes suggesting improving pulmonary edema. Pleural effusions and bibasilar opacities are similar. Electronically Signed   By: Jeb Levering M.D.   On: 09/18/2015 01:12    Procedures Procedures (including critical care time)  CRITICAL CARE Performed by: Merryl Hacker   Total critical care time: 25 minutes  Critical care time was exclusive of separately billable procedures and treating other patients.  Critical care was necessary to treat or prevent imminent or life-threatening deterioration.  Critical care was time spent personally by me on the following activities: development of treatment plan with patient and/or surrogate as well as nursing, discussions with consultants, evaluation of patient's response to treatment, examination of patient, obtaining history from patient or surrogate, ordering and performing treatments and interventions, ordering and review of laboratory studies, ordering and review of radiographic studies, pulse oximetry and re-evaluation of patient's condition.   Medications Ordered in ED Medications  sodium chloride 0.9 % bolus 1,000 mL (1,000 mLs Intravenous New Bag/Given 09/18/15 0050)    And  sodium chloride 0.9 % bolus 1,000 mL (not administered)    And  sodium chloride 0.9 % bolus 1,000 mL (not administered)  vancomycin (VANCOCIN) IVPB 1000 mg/200 mL premix (0 mg Intravenous Stopped 09/18/15 0205)  piperacillin-tazobactam (ZOSYN) IVPB 3.375 g (3.375 g Intravenous New Bag/Given 09/18/15 0205)     Initial Impression / Assessment and Plan / ED Course  I have reviewed the  triage vital signs and the nursing notes.  Pertinent labs & imaging results that were available during my care of the patient were reviewed by me and considered in my medical decision making (see chart for details).  Clinical Course  Patient presents  after EMS and police were called for well checkup. She was found on floor. Recent history of pneumonia and sepsis. Febrile to 101.9 here. She is alert and oriented. She denies any pain. She reports that she is thirsty and has a sore throat. She had urinated on herself. Initial blood pressure 93 systolic. For this reason, sepsis workup was initiated. Lactate normal.  Given initial hypotension, 30 mL/kg was ordered and broad-spectrum antibiotics initiated including Vanc and Zosyn. Urinalysis without obvious urinary tract infection. Chest x-ray no evidence of pneumonia.  She is on her baseline O2 requirement.  She denies any diarrhea. Leukocytosis to 25. She at baseline appears dehydrated. Source of infection unknown at this time. Will admit for further evaluation and workup. Concerned that the patient cannot take care of herself.  I personally performed the services described in this documentation, which was scribed in my presence. The recorded information has been reviewed and is accurate.   Final Clinical Impressions(s) / ED Diagnoses   Final diagnoses:  Dehydration  Other specified fever  Sepsis, due to unspecified organism [facility])    New Prescriptions New Prescriptions   No medications on file     Merryl Hacker, MD 09/18/15 [phone]

## 2015-09-18 NOTE — Progress Notes (Signed)
Initial Nutrition Assessment  DOCUMENTATION CODES:   Obesity unspecified  INTERVENTION:  -Ensure Enlive po BID, each supplement provides 350 kcal and 20 grams of protein -RD to continue to monitor  NUTRITION DIAGNOSIS:   Inadequate oral intake related to inability to eat, poor appetite, lethargy/confusion as evidenced by per patient/family report.  GOAL:   Patient will meet greater than or equal to 90% of their needs  MONITOR:   PO intake, I & O's, Labs, Weight trends  REASON FOR ASSESSMENT:   Consult Assessment of nutrition requirement/status  ASSESSMENT:   [name] is a 80 y.o. female with medical history significant of NHL and breast cancer with recent (7/3-7/17/17) admission for septic shock from [facility].  She was discharged to SNF and patient has already transitioned to home.  She reports someone called EMS because they hadn't seen her in a few days and they were concerned about her  Attempted to speak with pt at bedside. Unable to wake.  Nutrition-Focused physical exam completed. Findings are no fat depletion, no muscle depletion, and mild edema.  Exhibits 12#/5.5% severe wt loss in 2 weeks per chart review. Pt wheelchair bound, slept on the couch, did not receive anything to drink for the past few days because "no one brought it to her." "slid out of wheel chair, had difficulty getting back up." Suspect similar lack of PO intake with wt loss. Possible fall. Dehydration.  Documented PO intake 100% at breakfast - had cheerios, oatmeal, pineapple, fruit cup and cranberry juice.  Will monitor PO intake  Labs and Medications reviewed: K 3.3, Mg 1.6 Colace NaCL w/ KCL 23mEq @ 48mL/hr  Diet Order:  Diet regular Room service appropriate? Yes; Fluid consistency: Thin  Skin:  Reviewed, no issues  Last BM:  8/3  Height:   Ht Readings from Last 1 Encounters:  09/18/15 5\' 5"  (1.651 m)    Weight:   Wt Readings from Last 1 Encounters:  09/18/15 205 lb 0.4 oz (93  kg)    Ideal Body Weight:  56.81 kg  BMI:  Body mass index is 34.12 kg/m.  Estimated Nutritional Needs:   Kcal:  1975-2300 (30-35 cal/kg ABW)  Protein:  110-140 gm  Fluid:  >/= 2L  EDUCATION NEEDS:   No education needs identified at this time  [name]. [name], MS, RD LDN Inpatient Clinical Dietitian Pager [phone]

## 2015-09-19 ENCOUNTER — Inpatient Hospital Stay (HOSPITAL_COMMUNITY): Payer: PPO

## 2015-09-19 DIAGNOSIS — R748 Abnormal levels of other serum enzymes: Secondary | ICD-10-CM

## 2015-09-19 DIAGNOSIS — I5030 Unspecified diastolic (congestive) heart failure: Secondary | ICD-10-CM

## 2015-09-19 DIAGNOSIS — D72829 Elevated white blood cell count, unspecified: Secondary | ICD-10-CM

## 2015-09-19 DIAGNOSIS — Z993 Dependence on wheelchair: Secondary | ICD-10-CM

## 2015-09-19 DIAGNOSIS — R739 Hyperglycemia, unspecified: Secondary | ICD-10-CM

## 2015-09-19 DIAGNOSIS — E46 Unspecified protein-calorie malnutrition: Secondary | ICD-10-CM

## 2015-09-19 DIAGNOSIS — E876 Hypokalemia: Secondary | ICD-10-CM

## 2015-09-19 LAB — BLOOD CULTURE ID PANEL (REFLEXED)
ACINETOBACTER BAUMANNII: NOT DETECTED
CANDIDA ALBICANS: NOT DETECTED
CANDIDA GLABRATA: NOT DETECTED
CANDIDA KRUSEI: NOT DETECTED
CANDIDA PARAPSILOSIS: NOT DETECTED
CANDIDA TROPICALIS: NOT DETECTED
Carbapenem resistance: NOT DETECTED
ENTEROBACTER CLOACAE COMPLEX: NOT DETECTED
ENTEROBACTERIACEAE SPECIES: NOT DETECTED
ESCHERICHIA COLI: NOT DETECTED
Enterococcus species: NOT DETECTED
Haemophilus influenzae: NOT DETECTED
KLEBSIELLA OXYTOCA: NOT DETECTED
KLEBSIELLA PNEUMONIAE: NOT DETECTED
Listeria monocytogenes: NOT DETECTED
Methicillin resistance: DETECTED — AB
Neisseria meningitidis: NOT DETECTED
PROTEUS SPECIES: NOT DETECTED
PSEUDOMONAS AERUGINOSA: NOT DETECTED
STAPHYLOCOCCUS AUREUS BCID: NOT DETECTED
STREPTOCOCCUS AGALACTIAE: NOT DETECTED
STREPTOCOCCUS PNEUMONIAE: NOT DETECTED
STREPTOCOCCUS PYOGENES: NOT DETECTED
Serratia marcescens: NOT DETECTED
Staphylococcus species: DETECTED — AB
Streptococcus species: NOT DETECTED
VANCOMYCIN RESISTANCE: NOT DETECTED

## 2015-09-19 LAB — COMPREHENSIVE METABOLIC PANEL
ALK PHOS: 137 U/L — AB (ref 38–126)
ALT: 50 U/L (ref 14–54)
ANION GAP: 5 (ref 5–15)
AST: 66 U/L — AB (ref 15–41)
Albumin: 2.4 g/dL — ABNORMAL LOW (ref 3.5–5.0)
BILIRUBIN TOTAL: 0.8 mg/dL (ref 0.3–1.2)
BUN: 11 mg/dL (ref 6–20)
CALCIUM: 7.5 mg/dL — AB (ref 8.9–10.3)
CO2: 20 mmol/L — ABNORMAL LOW (ref 22–32)
Chloride: 112 mmol/L — ABNORMAL HIGH (ref 101–111)
Creatinine, Ser: 0.67 mg/dL (ref 0.44–1.00)
Glucose, Bld: 119 mg/dL — ABNORMAL HIGH (ref 65–99)
Potassium: 4 mmol/L (ref 3.5–5.1)
Sodium: 137 mmol/L (ref 135–145)
TOTAL PROTEIN: 5.1 g/dL — AB (ref 6.5–8.1)

## 2015-09-19 LAB — CBC WITH DIFFERENTIAL/PLATELET
BASOS ABS: 0 10*3/uL (ref 0.0–0.1)
Basophils Relative: 0 %
EOS PCT: 9 %
Eosinophils Absolute: 1.3 10*3/uL — ABNORMAL HIGH (ref 0.0–0.7)
HEMATOCRIT: 32.6 % — AB (ref 36.0–46.0)
HEMOGLOBIN: 10.3 g/dL — AB (ref 12.0–15.0)
LYMPHS ABS: 1 10*3/uL (ref 0.7–4.0)
LYMPHS PCT: 7 %
MCH: 26.8 pg (ref 26.0–34.0)
MCHC: 31.6 g/dL (ref 30.0–36.0)
MCV: 84.9 fL (ref 78.0–100.0)
Monocytes Absolute: 1 10*3/uL (ref 0.1–1.0)
Monocytes Relative: 7 %
NEUTROS ABS: 10.5 10*3/uL — AB (ref 1.7–7.7)
Neutrophils Relative %: 77 %
Platelets: 205 10*3/uL (ref 150–400)
RBC: 3.84 MIL/uL — AB (ref 3.87–5.11)
RDW: 18.1 % — ABNORMAL HIGH (ref 11.5–15.5)
WBC: 13.9 10*3/uL — AB (ref 4.0–10.5)

## 2015-09-19 LAB — URINE CULTURE

## 2015-09-19 MED ORDER — LORAZEPAM 0.5 MG PO TABS
0.5000 mg | ORAL_TABLET | Freq: Two times a day (BID) | ORAL | Status: DC
  Administered 2015-09-19 – 2015-09-20 (×3): 0.5 mg via ORAL
  Filled 2015-09-19 (×3): qty 1

## 2015-09-19 MED ORDER — DOXYCYCLINE HYCLATE 100 MG PO TABS
100.0000 mg | ORAL_TABLET | Freq: Two times a day (BID) | ORAL | Status: DC
  Administered 2015-09-19 – 2015-09-20 (×3): 100 mg via ORAL
  Filled 2015-09-19 (×3): qty 1

## 2015-09-19 NOTE — Progress Notes (Signed)
PROGRESS NOTE    [name]  [id number]  DOB: Feb 19, 1935  DOA: 09/17/2015 PCP:  Melinda Crutch, MD Outpatient Specialists:   Hospital course: [name] is a 80 y.o. female with medical history significant of NHL and breast cancer with recent (7/3-7/17/17) admission for septic shock from CAP.  She was discharged to SNF and patient has already transitioned to home.  She reports someone called EMS because they hadn't seen her in a few days and they were concerned about her.  She thinks this is ridiculous.  She uses a wheelchair and has been home.  She thinks she might have slept on the couch the last night or two.  Hasn't had anything to drink the next few days because no one was able to bring it to her.  Slid out of the wheelchair and had difficulty getting back up.  Sore throat, not feeling good.  Otherwise denies any symptoms of infection.  Assessment & Plan:  1. Severe Sepsis - clinically much improved, de-escalate antibiotics, source unclear but possibly due to urine infection although urine culture inconclusive, 1/2 blood culture positive for coag neg staph species, likely a contaminant from skin.  WBC trending down and afebrile.  Supportive care has been very helpful.   2. UTI - yeast seen and started on oral fluconazole, urine culture inconclusive.   3. Leukocytosis - WBC trending down.  Repeat in AM.  4. Hypokalemia - repleted.  5. Wheelchair bound with severe physical debilitation - Pt had been refusing SNF placement but has reluctantly agreed.  PT has strongly recommended SNF.   6. Chronic grade 1 diastolic CHF - appears clinically compensated at this time, most recent echo 7/17 showed preserved EF.    DVT prophylaxis: Lovenox  Code Status: Full - confirmed with patient Family Communication: None present at bedside Disposition Plan: SNF Consults called: SW, nutrition, care mgmt  Antimicrobials: Anti-infectives    Start     Dose/Rate Route Frequency Ordered Stop   09/19/15 1000  doxycycline (VIBRA-TABS) tablet 100 mg     100 mg Oral Every 12 hours 09/19/15 0918     09/18/15 1400  fluconazole (DIFLUCAN) tablet 100 mg     100 mg Oral Daily 09/18/15 1228 09/21/15 0959   09/18/15 1200  vancomycin (VANCOCIN) IVPB 750 mg/150 ml premix  Status:  Discontinued     750 mg 150 mL/hr over 60 Minutes Intravenous Every 12 hours 09/18/15 0319 09/19/15 0918   09/18/15 1000  piperacillin-tazobactam (ZOSYN) IVPB 3.375 g  Status:  Discontinued     3.375 g 12.5 mL/hr over 240 Minutes Intravenous Every 8 hours 09/18/15 0319 09/19/15 0918   09/18/15 0315  piperacillin-tazobactam (ZOSYN) IVPB 3.375 g  Status:  Discontinued     3.375 g 100 mL/hr over 30 Minutes Intravenous  Once 09/18/15 0308 09/18/15 0314   09/18/15 0315  vancomycin (VANCOCIN) IVPB 1000 mg/200 mL premix  Status:  Discontinued     1,000 mg 200 mL/hr over 60 Minutes Intravenous  Once 09/18/15 0308 09/18/15 0315   09/18/15 0030  vancomycin (VANCOCIN) IVPB 1000 mg/200 mL premix     1,000 mg 200 mL/hr over 60 Minutes Intravenous  Once 09/18/15 0024 09/18/15 0205   09/18/15 0030  piperacillin-tazobactam (ZOSYN) IVPB 3.375 g     3.375 g 100 mL/hr over 30 Minutes Intravenous  Once 09/18/15 0024 09/18/15 2043        Subjective: Pt says she is feeling better, eating and drinking well.  Lost IV access but  no PICC line ordered as she is eating and drinking well.  Objective: Vitals:   09/18/15 1436 09/18/15 2158 09/19/15 0548 09/19/15 1313  BP: 117/67 (!) 111/48 (!) 107/50 109/61  Pulse: 100 98 99 98  Resp: 16 19 20 20   Temp: 98.4 F (36.9 C) 99.8 F (37.7 C) 98.8 F (37.1 C) 99.3 F (37.4 C)  TempSrc: Axillary Axillary Axillary Oral  SpO2: 97% 95% 96% 98%  Weight:      Height:        Intake/Output Summary (Last 24 hours) at 09/19/15 1506 Last data filed at 09/19/15 1313  Gross per 24 hour  Intake             2505 ml  Output              150 ml  Net             2355 ml   Filed Weights    09/18/15 0155 09/18/15 0306  Weight: 104.3 kg (230 lb) 93 kg (205 lb 0.4 oz)   Exam:   General: Appears calm and comfortable and is NAD; disheveled, poor dentition  Eyes:  PERRL, EOMI, normal lids, iris  ENT:  grossly normal hearing, lips & tongue, MMM  Neck:  no LAD, masses or thyromegaly  Cardiovascular:  RRR, no m/r/g. No LE edema.   Respiratory:  CTA bilaterally, no w/r/r. Normal respiratory effort.  Abdomen:  soft, ntnd, NABS  Skin:  no rash or induration seen on limited exam other than an apparently chronic plaque on the R anterior shin  Musculoskeletal:  grossly normal tone BUE/BLE, good ROM, no bony abnormality  Psychiatric:  grossly normal mood and affect, speech fluent and appropriate, AOx3  Neurologic:  CN 2-12 grossly intact, moves all extremities in coordinated fashion, sensation intact  Data Reviewed: Basic Metabolic Panel:  Recent Labs Lab 09/18/15 0048 09/19/15 0632  NA 137 137  K 3.3* 4.0  CL 107 112*  CO2 20* 20*  GLUCOSE 134* 119*  BUN 14 11  CREATININE 0.81 0.67  CALCIUM 8.5* 7.5*   Liver Function Tests:  Recent Labs Lab 09/18/15 0048 09/19/15 0632  AST 40 66*  ALT 41 50  ALKPHOS 178* 137*  BILITOT 1.0 0.8  PROT 6.3* 5.1*  ALBUMIN 3.2* 2.4*   No results for input(s): LIPASE, AMYLASE in the last 168 hours. No results for input(s): AMMONIA in the last 168 hours. CBC:  Recent Labs Lab 09/18/15 0048 09/19/15 0632  WBC 25.2* 13.9*  NEUTROABS 22.4* 10.5*  HGB 12.5 10.3*  HCT 38.8 32.6*  MCV 83.4 84.9  PLT 268 205   Cardiac Enzymes: No results for input(s): CKTOTAL, CKMB, CKMBINDEX, TROPONINI in the last 168 hours. CBG (last 3)  No results for input(s): GLUCAP in the last 72 hours. Recent Results (from the past 240 hour(s))  Culture, blood (Routine x 2)     Status: None (Preliminary result)   Collection Time: 09/18/15 12:48 AM  Result Value Ref Range Status   Specimen Description BLOOD LEFT ANTECUBITAL  Final   Special  Requests BOTTLES DRAWN AEROBIC AND ANAEROBIC 10CC  Final   Culture  Setup Time   Final    GRAM POSITIVE COCCI IN CLUSTERS IN BOTH AEROBIC AND ANAEROBIC BOTTLES Organism ID to follow CRITICAL RESULT CALLED TO, READ BACK BY AND VERIFIED WITH: B GREEN PHARMD [id number] 09/19/15 A BROWNING    Culture   Final    NO GROWTH 1 DAY Performed at [facility]  Report Status PENDING  Incomplete  Blood Culture ID Panel (Reflexed)     Status: Abnormal   Collection Time: 09/18/15 12:48 AM  Result Value Ref Range Status   Enterococcus species NOT DETECTED NOT DETECTED Final   Vancomycin resistance NOT DETECTED NOT DETECTED Final   Listeria monocytogenes NOT DETECTED NOT DETECTED Final   Staphylococcus species DETECTED (A) NOT DETECTED Final    Comment: CRITICAL RESULT CALLED TO, READ BACK BY AND VERIFIED WITH: B GREEN PHARMD 0446 09/19/15 A BROWNING    Staphylococcus aureus NOT DETECTED NOT DETECTED Final   Methicillin resistance DETECTED (A) NOT DETECTED Final    Comment: CRITICAL RESULT CALLED TO, READ BACK BY AND VERIFIED WITH: B GREEN PHARMD 0446 09/19/15 A BROWNING    Streptococcus species NOT DETECTED NOT DETECTED Final   Streptococcus agalactiae NOT DETECTED NOT DETECTED Final   Streptococcus pneumoniae NOT DETECTED NOT DETECTED Final   Streptococcus pyogenes NOT DETECTED NOT DETECTED Final   Acinetobacter baumannii NOT DETECTED NOT DETECTED Final   Enterobacteriaceae species NOT DETECTED NOT DETECTED Final   Enterobacter cloacae complex NOT DETECTED NOT DETECTED Final   Escherichia coli NOT DETECTED NOT DETECTED Final   Klebsiella oxytoca NOT DETECTED NOT DETECTED Final   Klebsiella pneumoniae NOT DETECTED NOT DETECTED Final   Proteus species NOT DETECTED NOT DETECTED Final   Serratia marcescens NOT DETECTED NOT DETECTED Final   Carbapenem resistance NOT DETECTED NOT DETECTED Final   Haemophilus influenzae NOT DETECTED NOT DETECTED Final   Neisseria meningitidis NOT DETECTED NOT  DETECTED Final   Pseudomonas aeruginosa NOT DETECTED NOT DETECTED Final   Candida albicans NOT DETECTED NOT DETECTED Final   Candida glabrata NOT DETECTED NOT DETECTED Final   Candida krusei NOT DETECTED NOT DETECTED Final   Candida parapsilosis NOT DETECTED NOT DETECTED Final   Candida tropicalis NOT DETECTED NOT DETECTED Final    Comment: Performed at [facility]  Urine culture     Status: Abnormal   Collection Time: 09/18/15  1:28 AM  Result Value Ref Range Status   Specimen Description URINE, RANDOM  Final   Special Requests NONE  Final   Culture MULTIPLE SPECIES PRESENT, SUGGEST RECOLLECTION (A)  Final   Report Status 09/19/2015 FINAL  Final    Studies: Dg Chest Port 1 View  Result Date: 09/19/2015 CLINICAL DATA:  Sepsis. EXAM: PORTABLE CHEST 1 VIEW COMPARISON:  Radiograph of September 18, 2015. FINDINGS: Stable cardiomegaly. Atherosclerosis of thoracic aorta is noted. No pneumothorax is noted. Stable bibasilar opacities are noted concerning for edema . Stable small bilateral pleural effusions. Degenerative changes noted in the thoracic spine. IMPRESSION: Aortic atherosclerosis. Stable bibasilar opacities concerning for edema. Electronically Signed   By: Marijo Conception, M.D.   On: 09/19/2015 07:09   Dg Chest Port 1 View  Result Date: 09/18/2015 CLINICAL DATA:  Sepsis. Shortness of breath. Found on floor in home. EXAM: PORTABLE CHEST 1 VIEW COMPARISON:  PET-CT 09/08/2015, chest radiographs 08/31/2015 FINDINGS: Patient is rotated. Unchanged cardiomegaly. Interstitial changes are seen, with mild decrease from prior radiographs suggesting improving edema. Small pleural effusions are similar. Bibasilar opacities are also similar. No new focal airspace disease. No pneumothorax. Degenerative change in the spine. IMPRESSION: Unchanged cardiomegaly. Improved interstitial changes suggesting improving pulmonary edema. Pleural effusions and bibasilar opacities are similar. Electronically Signed    By: Jeb Levering M.D.   On: 09/18/2015 01:12     Scheduled Meds: . amitriptyline  100 mg Oral QHS  . aspirin EC  81 mg  Oral q morning - 10a  . atorvastatin  80 mg Oral Daily  . docusate sodium  100 mg Oral BID  . doxycycline  100 mg Oral Q12H  . enoxaparin (LOVENOX) injection  40 mg Subcutaneous Q24H  . feeding supplement (ENSURE ENLIVE)  237 mL Oral BID BM  . fluconazole  100 mg Oral Daily  . LORazepam  0.5 mg Oral BID  . sodium chloride flush  3 mL Intravenous Q12H   Continuous Infusions:   Principal Problem:   Sepsis ([place]) Active Problems:   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 -   History of B-cell lymphoma   Leukocytosis   Wheelchair dependent   Hypokalemia   Hyperglycemia   Protein-calorie malnutrition (HCC)   Elevated alkaline phosphatase level   (HFpEF) heart failure with preserved ejection fraction ([place])   Time spent:   Irwin Brakeman, MD, FAAFP [facility] Pager [phone]  If 7PM-7AM, please contact night-coverage [email or web address] Password [email or web address] 09/19/2015, 3:06 PM    LOS: 1 day

## 2015-09-19 NOTE — Care Management Note (Signed)
Case Management Note  Patient Details  Name: [name] MRN: [id number] Date of Birth: 12-08-1935  Subjective/Objective: Spoke to patient in rm about going to rehab. She is in agreement to SNF even if it's only 8 days left. CSW aware & following.                   Action/Plan:d/c SNF.   Expected Discharge Date:                  Expected Discharge Plan:  Skilled Nursing Facility  In-House Referral:  Clinical Social Work  Discharge planning Services     Post Acute Care Choice:    Choice offered to:     DME Arranged:    DME Agency:     HH Arranged:    [place] Agency:     Status of Service:  In process, will continue to follow  If discussed at Long Length of Stay Meetings, dates discussed:    Additional Comments:  Dessa Phi, RN 09/19/2015, 1:52 PM

## 2015-09-19 NOTE — Clinical Social Work Placement (Signed)
CSW reviewed PT evaluation recommending SNF - patient is agreeable & had requested to return to [facility]. CSW spoke with Diane at [facility] who states that they are unable to offer a bed. Patient was at [facility] from 7/17 - 7/28. CSW provided bed offers to patient, she is leaning towards [facility], but would like to talk it over with her POA, Shanon Brow this evening. CSW to check back re: SNF decision, per Dr. Wynetta Emery - patient will likely discharge tomorrow, Sat 8/5 - patient aware.    Raynaldo Opitz, [facility] Clinical Social Worker cell #: [phone]   CLINICAL SOCIAL WORK PLACEMENT  NOTE  Date:  09/19/2015  Patient Details  Name: [name] MRN: [id number] Date of Birth: Feb 14, 1936  Clinical Social Work is seeking post-discharge placement for this patient at the [place] level of care (*CSW will initial, date and re-position this form in  chart as items are completed):  Yes   Patient/family provided with [facility] Work Department's list of facilities offering this level of care within the geographic area requested by the patient (or if unable, by the patient's family).  Yes   Patient/family informed of their freedom to choose among providers that offer the needed level of care, that participate in Medicare, Medicaid or managed care program needed by the patient, have an available bed and are willing to accept the patient.  Yes   Patient/family informed of [facility]'s ownership interest in [facility] and [facility], as well as of the fact that they are under no obligation to receive care at these facilities.  PASRR submitted to EDS on       PASRR number received on       Existing PASRR number confirmed on 09/19/15     FL2 transmitted to all facilities in geographic area requested by pt/family on 09/19/15     FL2 transmitted to all facilities within larger geographic area on       Patient informed that  his/her managed care company has contracts with or will negotiate with certain facilities, including the following:        Yes   Patient/family informed of bed offers received.  Patient chooses bed at       Physician recommends and patient chooses bed at      Patient to be transferred to   on  .  Patient to be transferred to facility by       Patient family notified on   of transfer.  Name of family member notified:        PHYSICIAN       Additional Comment:    _______________________________________________ Standley Brooking, LCSW 09/19/2015, 3:02 PM

## 2015-09-19 NOTE — NC FL2 (Signed)
[place] MEDICAID FL2 LEVEL OF CARE SCREENING TOOL     IDENTIFICATION  Patient Name: [name] Birthdate: [birth year] Sex: female Admission Date (Current Location): 09/17/2015  [facility] and [place] Number:  Herbalist and Address:  [facility],  [place] [address] [place]      Provider Number: [id number]  Attending Physician Name and Address:  Murlean Iba, MD  Relative Name and Phone Number:       Current Level of Care: Hospital Recommended Level of Care: [place] Prior Approval Number:    Date Approved/Denied:   PASRR Number: [id number]  Discharge Plan: SNF    Current Diagnoses: Patient Active Problem List   Diagnosis Date Noted  . Sepsis ([place]) 09/18/2015  . Abnormal PET scan of lung 09/18/2015  . Anxiety disorder 09/18/2015  . Carotid artery disease ([place]) 09/18/2015  . Contracture of knee 09/18/2015  . Acid reflux 09/18/2015  . H/O high risk medication treatment 09/18/2015  . History of colon polyps 09/18/2015  . Degenerative arthritis of spine 09/18/2015  . Bulky or enlarged uterus 09/18/2015  . Hypokalemia 09/18/2015  . Hyperglycemia 09/18/2015  . Protein-calorie malnutrition ([place]) 09/18/2015  . Elevated alkaline phosphatase level 09/18/2015  . (HFpEF) heart failure with preserved ejection fraction ([place]) 09/18/2015  . Septic shock ([place]) 09/01/2015  . Leukocytosis 09/01/2015  . CAP (community acquired pneumonia) 09/01/2015  . Fluid overload 09/01/2015  . Wheelchair dependent 09/01/2015  . Morbid obesity ([place]) 09/01/2015  . Sleep apnea- declines CPAP 09/01/2015  . Endometrial thickening on ultra sound 09/01/2015  . Constipation by delayed colonic transit 09/01/2015  . Cerebrovascular disease 06/02/2015  . Abdominal pain 06/17/2014  . Bruit 06/07/2014  . Onychomycosis 05/21/2014  . Adnexal mass 01/17/2014  . Ovarian cancer screening 11/12/2013  . Port catheter in place 09/05/2013  . Elevated  liver function tests 08/08/2013  . History of B-cell lymphoma 05/10/2013  . Delusional disorder ([place]) 02/15/2012  . EDEMA 10/23/2009  . Coronary atherosclerosis 08/11/2009  . ANXIETY 07/29/2008  . OSTEOARTHRITIS 07/29/2008  . DYSPNEA 10/24/2007  . Hyperlipidemia, mixed 09/22/2007  . ALLERGIC RHINITIS 09/22/2007  . 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 - 09/22/2007    Orientation RESPIRATION BLADDER Height & Weight     Self, Time, Situation, Place  Normal Incontinent Weight: 205 lb 0.4 oz (93 kg) Height:  5\' 5"  (165.1 cm)  BEHAVIORAL SYMPTOMS/MOOD NEUROLOGICAL BOWEL NUTRITION STATUS      Incontinent Diet (Regular)  AMBULATORY STATUS COMMUNICATION OF NEEDS Skin   Extensive Assist Verbally Normal                       Personal Care Assistance Level of Assistance  Bathing, Feeding, Dressing Bathing Assistance: Limited assistance Feeding assistance: Independent Dressing Assistance: Limited assistance     Functional Limitations Info  Sight, Hearing, Speech Sight Info: Adequate Hearing Info: Adequate Speech Info: Adequate    SPECIAL CARE FACTORS FREQUENCY  PT (By licensed PT), OT (By licensed OT)     PT Frequency: 5 OT Frequency: 5            Contractures Contractures Info: Not present    Additional Factors Info  Code Status, Allergies, Psychotropic Code Status Info: Fullcode Allergies Info: NKDA           Current Medications (09/19/2015):  This is the current hospital active medication list Current Facility-Administered Medications  Medication Dose Route Frequency Provider Last Rate Last  Dose  . acetaminophen (TYLENOL) tablet 650 mg  650 mg Oral Q6H PRN Karmen Bongo, MD       Or  . acetaminophen (TYLENOL) suppository 650 mg  650 mg Rectal Q6H PRN Karmen Bongo, MD      . amitriptyline (ELAVIL) tablet 100 mg  100 mg Oral QHS Karmen Bongo, MD   100 mg at 09/18/15 2043  . aspirin EC tablet 81 mg  81 mg Oral q morning -  10a Karmen Bongo, MD   81 mg at 09/19/15 1058  . atorvastatin (LIPITOR) tablet 80 mg  80 mg Oral Daily Karmen Bongo, MD   80 mg at 09/18/15 1726  . docusate sodium (COLACE) capsule 100 mg  100 mg Oral BID Karmen Bongo, MD   100 mg at 09/19/15 1058  . doxycycline (VIBRA-TABS) tablet 100 mg  100 mg Oral Q12H [place] Marisa Hua, MD   100 mg at 09/19/15 1058  . enoxaparin (LOVENOX) injection 40 mg  40 mg Subcutaneous Q24H Karmen Bongo, MD   40 mg at 09/19/15 1058  . feeding supplement (ENSURE ENLIVE) (ENSURE ENLIVE) liquid 237 mL  237 mL Oral BID BM Karmen Bongo, MD   237 mL at 09/18/15 1500  . fluconazole (DIFLUCAN) tablet 100 mg  100 mg Oral Daily [place] Marisa Hua, MD   100 mg at 09/19/15 1058  . LORazepam (ATIVAN) tablet 0.5 mg  0.5 mg Oral BID [place] Marisa Hua, MD   0.5 mg at 09/19/15 1058  . ondansetron (ZOFRAN) tablet 4 mg  4 mg Oral Q6H PRN Karmen Bongo, MD       Or  . ondansetron [facility]) injection 4 mg  4 mg Intravenous Q6H PRN Karmen Bongo, MD      . polyethylene glycol (MIRALAX / GLYCOLAX) packet 17 g  17 g Oral BID PRN Karmen Bongo, MD      . sodium chloride flush (NS) 0.9 % injection 3 mL  3 mL Intravenous Q12H Karmen Bongo, MD   3 mL at 09/18/15 2044     Discharge Medications: Please see discharge summary for a list of discharge medications.  Relevant Imaging Results:  Relevant Lab Results:   Additional Information SSN: [id number]  Standley Brooking, LCSW

## 2015-09-19 NOTE — Progress Notes (Signed)
PHARMACY - PHYSICIAN COMMUNICATION CRITICAL VALUE ALERT - BLOOD CULTURE IDENTIFICATION (BCID)  Results for orders placed or performed during the hospital encounter of 09/17/15  Blood Culture ID Panel (Reflexed) (Collected: 09/18/2015 12:48 AM)  Result Value Ref Range   Enterococcus species NOT DETECTED NOT DETECTED   Vancomycin resistance NOT DETECTED NOT DETECTED   Listeria monocytogenes NOT DETECTED NOT DETECTED   Staphylococcus species DETECTED (A) NOT DETECTED   Staphylococcus aureus NOT DETECTED NOT DETECTED   Methicillin resistance DETECTED (A) NOT DETECTED   Streptococcus species NOT DETECTED NOT DETECTED   Streptococcus agalactiae NOT DETECTED NOT DETECTED   Streptococcus pneumoniae NOT DETECTED NOT DETECTED   Streptococcus pyogenes NOT DETECTED NOT DETECTED   Acinetobacter baumannii NOT DETECTED NOT DETECTED   Enterobacteriaceae species NOT DETECTED NOT DETECTED   Enterobacter cloacae complex NOT DETECTED NOT DETECTED   Escherichia coli NOT DETECTED NOT DETECTED   Klebsiella oxytoca NOT DETECTED NOT DETECTED   Klebsiella pneumoniae NOT DETECTED NOT DETECTED   Proteus species NOT DETECTED NOT DETECTED   Serratia marcescens NOT DETECTED NOT DETECTED   Carbapenem resistance NOT DETECTED NOT DETECTED   Haemophilus influenzae NOT DETECTED NOT DETECTED   Neisseria meningitidis NOT DETECTED NOT DETECTED   Pseudomonas aeruginosa NOT DETECTED NOT DETECTED   Candida albicans NOT DETECTED NOT DETECTED   Candida glabrata NOT DETECTED NOT DETECTED   Candida krusei NOT DETECTED NOT DETECTED   Candida parapsilosis NOT DETECTED NOT DETECTED   Candida tropicalis NOT DETECTED NOT DETECTED    Name of physician (or Provider) Contacted: D. Crosley  Changes to prescribed antibiotics required:  Vancomycin covers if not contaminate.  Consider de-escalation if pt clinically stable.   Dorrene German 09/19/2015  5:03 AM

## 2015-09-20 DIAGNOSIS — Z8572 Personal history of non-Hodgkin lymphomas: Secondary | ICD-10-CM

## 2015-09-20 DIAGNOSIS — Z853 Personal history of malignant neoplasm of breast: Secondary | ICD-10-CM

## 2015-09-20 MED ORDER — DOCUSATE SODIUM 100 MG PO CAPS: 100.0000 mg | ORAL_CAPSULE | Freq: Two times a day (BID) | ORAL | 0 refills | Status: AC | PRN

## 2015-09-20 MED ORDER — ENSURE ENLIVE PO LIQD: 237.0000 mL | Freq: Two times a day (BID) | ORAL | 2 refills | Status: DC

## 2015-09-20 MED ORDER — LORAZEPAM 0.5 MG PO TABS: 0.5000 mg | ORAL_TABLET | Freq: Two times a day (BID) | ORAL | 0 refills | Status: AC | PRN

## 2015-09-20 MED ORDER — ACETAMINOPHEN 500 MG PO TABS: 500.0000 mg | ORAL_TABLET | Freq: Four times a day (QID) | ORAL | 0 refills | Status: AC | PRN

## 2015-09-20 MED ORDER — DOXYCYCLINE HYCLATE 100 MG PO TABS: 100.0000 mg | ORAL_TABLET | Freq: Two times a day (BID) | ORAL | 0 refills | Status: AC

## 2015-09-20 NOTE — Discharge Instructions (Signed)
Antibiotic Medicine Antibiotic medicines are used to treat infections caused by bacteria. They work by hurting or killing the germs that are making you sick. HOW WILL MY MEDICINE BE PICKED? There are many kinds of antibiotic medicines. To help your doctor pick one, tell your doctor if:  You have any allergies.  You are pregnant or plan to get pregnant.  You are breastfeeding.  You are taking any medicines. These include over-the-counter medicines, prescription medicines, and herbal remedies.  You have a medical condition or problem. If you have questions about why your medicine was picked, ask. FOR HOW LONG SHOULD I TAKE MY MEDICINE? Take your medicine for as long as your doctor tells you to. Do not stop taking it when you feel better. If you stop taking it too soon:  You may start to feel sick again.  Your infection may get harder to treat.  New problems may develop. WHAT IF I MISS A DOSE? Try not to miss any doses of antibiotic medicine. If you miss a dose:  Take the dose as soon as you can.  If you are taking 2 doses a day, take the next dose in 5 to 6 hours.  If you are taking 3 or more doses a day, take the next dose in 2 to 4 hours. Then go back to the normal schedule. If you cannot take a missed dose, take the next dose on time. Then take the missed dose after you have taken all the doses as told by your doctor, as if you had one more dose left. DOES THIS MEDICINE AFFECT BIRTH CONTROL? Birth control pills may not work while you are on antibiotic medicines. If you are taking birth control pills, keep taking them as usual. Use a second form of birth control, such as a condom. Keep using the second form of birth control until you are finished with your current 1 month cycle of birth control pills. GET HELP IF:  You get worse.  You do not feel better a few days after starting the medicine.  You throw up (vomit).  There are white patches in your mouth.  You have new  joint pain after starting the medicine.  You have new muscle aches after starting the medicine.  You had a fever before starting the medicine, and it comes back.  You have any symptoms of an allergic reaction, such as an itchy rash. If this happens, stop taking the medicine. GET HELP RIGHT AWAY IF:  Your pee (urine) turns dark or becomes blood-colored.  Your skin turns yellow.  You bruise or bleed easily.  You have very bad watery poop (diarrhea) and cramps in your belly (abdomen).  You have a very bad headache.  You have signs of a very bad allergic reaction, such as:  Trouble breathing.  Wheezing.  Swelling of the lips, tongue, or face.  Fainting.  Blisters on the skin or in the mouth. If you have signs of a very bad allergic reaction, stop taking the antibiotic medicine right away.   This information is not intended to replace advice given to you by your health care provider. Make sure you discuss any questions you have with your health care provider.   Document Released: 11/11/2007 Document Revised: 10/23/2014 Document Reviewed: 06/19/2014 Elsevier Interactive Patient Education 2016 Elsevier Inc.   Dehydration, Adult Dehydration means your body does not have as much fluid or water as it needs. It happens when you take in less fluid than you lose. Your kidneys,  brain, and heart will not work properly without the right amount of fluids.  Dehydration can range from mild to severe. It should be treated right away to help prevent it from becoming severe. HOME CARE  Drink enough fluid to keep your pee (urine) clear or pale yellow.  Drink water or fluid slowly by taking small sips. You can also try sucking on ice cubes.  Have food or drinks that contain electrolytes. Examples include bananas and sports drinks.  Take over-the-counter and prescription medicines only as told by your doctor.  Prepare oral rehydration solution (ORS) according to the instructions that came  with it. Take sips of ORS every 5 minutes until your pee returns to normal.  If you are throwing up (vomiting) or have watery poop (diarrhea), keep trying to drink water, ORS, or both.  If you have watery poop, avoid:  Drinks with caffeine.  Fruit juice.  Milk.  Carbonated soft drinks.  Do not take salt tablets. This can lead to having too much sodium in your body (hypernatremia). GET HELP IF:  You cannot eat or drink without throwing up.  You have had mild watery poop for longer than 24 hours.  You have a fever. GET HELP RIGHT AWAY IF:   You have very strong thirst.  You have very bad watery poop.  You have not peed in 6-8 hours, or you have peed only a small amount of very dark pee.  You have shriveled skin.  You are dizzy, confused, or both.   This information is not intended to replace advice given to you by your health care provider. Make sure you discuss any questions you have with your health care provider.   Document Released: 11/28/2008 Document Revised: 10/23/2014 Document Reviewed: 06/19/2014 Elsevier Interactive Patient Education 2016 [place].  Dehydration Dehydration is when you lose more fluids from the body than you take in. Vital organs such as the kidneys, brain, and heart cannot function without a proper amount of fluids and salt. Any loss of fluids from the body can cause dehydration.  Older adults are at a higher risk of dehydration than younger adults. As we age, our bodies are less able to conserve water and do not respond to temperature changes as well. Also, older adults do not become thirsty as easily or quickly. Because of this, older adults often do not realize they need to increase fluids to avoid dehydration.  CAUSES   Vomiting.  Diarrhea.  Excessive sweating.  Excessive urination.  Fever.  Certain medicines, such as blood pressure medicines called diuretics.  Poorly controlled blood sugars. SIGNS AND SYMPTOMS  Mild  dehydration:  Thirst.  Dry lips.  Slightly dry mouth. Moderate dehydration:  Very dry mouth.  Sunken eyes.  Skin does not bounce back quickly when lightly pinched and released.  Dark urine and decreased urine production.  Decreased tear production.  Headache. Severe dehydration:  Very dry mouth.  Extreme thirst.  Rapid, weak pulse (more than 100 beats per minute at rest).  Cold hands and feet.  Not able to sweat in spite of heat.  Rapid breathing.  Blue lips.  Confusion and lethargy.  Difficulty being awakened.  Minimal urine production.  No tears. DIAGNOSIS  Your health care provider will diagnose dehydration based on your symptoms and your exam. Blood and urine tests will help confirm the diagnosis. The diagnostic evaluation should also identify the cause of dehydration. TREATMENT  Treatment of mild or moderate dehydration can often be done at home by  increasing the amount of fluids that you drink. It is best to drink small amounts of fluid more often. Drinking too much at one time can make vomiting worse. Severe dehydration needs to be treated at the hospital. You may be given IV fluids that contain water and electrolytes. HOME CARE INSTRUCTIONS   Ask your health care provider about specific rehydration instructions.  Drink enough fluids to keep your urine clear or pale yellow.  Drink small amounts frequently if you have nausea and vomiting.  Eat as you normally do.  Avoid:  Foods or drinks high in sugar.  Carbonated drinks.  Juice.  Extremely hot or cold fluids.  Drinks with caffeine.  Fatty, greasy foods.  Alcohol.  Tobacco.  Overeating.  Gelatin desserts.  Wash your hands well to avoid spreading bacteria and viruses.  Only take over-the-counter or prescription medicines for pain, discomfort, or fever as directed by your health care provider.  Ask your health care provider if you should continue all prescribed and  over-the-counter medicines.  Keep all follow-up appointments with your health care provider. SEEK MEDICAL CARE IF:  You have abdominal pain, and it increases or stays in one area (localizes).  You have a rash, stiff neck, or severe headache.  You are irritable, sleepy, or difficult to awaken.  You are weak, dizzy, or extremely thirsty.  You have a fever. SEEK IMMEDIATE MEDICAL CARE IF:   You are unable to keep fluids down, or you get worse despite treatment.  You have frequent episodes of vomiting or diarrhea.  You have blood or green matter (bile) in your vomit.  You have blood in your stool, or your stool looks black and tarry.  You have not urinated in 6-8 hours, or you have only urinated a small amount of very dark urine.  You faint. MAKE SURE YOU:   Understand these instructions.  Will watch your condition.  Will get help right away if you are not doing well or get worse.   This information is not intended to replace advice given to you by your health care provider. Make sure you discuss any questions you have with your health care provider.   Document Released: 04/24/2003 Document Revised: 02/06/2013 Document Reviewed: 10/09/2012 Elsevier Interactive Patient Education 2016 [employer].   Near-Syncope Near-syncope (commonly known as near fainting) is sudden weakness, dizziness, or feeling like you might pass out. This can happen when getting up or while standing for a long time. It is caused by a sudden decrease in blood flow to the brain, which can occur for various reasons. Most of the reasons are not serious.  HOME CARE Watch your condition for any changes.  Have someone stay with you until you feel stable.  If you feel like you are going to pass out:  Lie down right away.  Prop your feet up if you can.  Breathe deeply and steadily.  Move only when the feeling has gone away. Most of the time, this feeling lasts only a few minutes. You may feel tired  for several hours.  Drink enough fluids to keep your pee (urine) clear or pale yellow.  If you are taking blood pressure or heart medicine, stand up slowly.  Follow up with your doctor as told. GET HELP RIGHT AWAY IF:   You have a severe headache.  You have unusual pain in the chest, belly (abdomen), or back.  You have bleeding from the mouth or butt (rectum), or you have black or tarry poop (stool).  You feel your heart beat differently than normal, or you have a very fast pulse.  You pass out, or you twitch and shake when you pass out.  You pass out when sitting or lying down.  You feel confused.  You have trouble walking.  You are weak.  You have vision problems. MAKE SURE YOU:   Understand these instructions.  Will watch your condition.  Will get help right away if you are not doing well or get worse.   This information is not intended to replace advice given to you by your health care provider. Make sure you discuss any questions you have with your health care provider.   Document Released: 07/21/2007 Document Revised: 02/22/2014 Document Reviewed: 07/07/2012 Elsevier Interactive Patient Education 2016 Elsevier Inc.  Rehydration, Elderly Rehydration is the replacement of body fluids lost during dehydration. Dehydration is an extreme loss of body fluids to the point of body function impairment. There are many ways extreme fluid loss can occur, including vomiting, diarrhea, or excess sweating. Recovering from dehydration requires replacing lost fluids, continuing to eat to maintain strength, and avoiding foods and beverages that may contribute to further fluid loss or may increase nausea. It is especially important for older adults to stay hydrated because dehydration can lead to dizziness and falls. Some factors that can contribute to dehydration are more common in the elderly, including the use of prescription medicines and a decreased sensation of thirst.  HOW TO  REHYDRATE In most cases, rehydration involves the replacement of not only fluids but also carbohydrates and basic body salts. Rehydration with an oral rehydration solution is one way to replace essential nutrients lost through dehydration. An oral rehydration solution can be purchased at pharmacies, retail stores, and online. Premixed packets of powder that you combine with water to make a solution are also sold. You can prepare an oral rehydration solution at home by mixing the following ingredients together:    - tsp table salt.   tsp baking soda.   tsp salt substitute containing potassium chloride.  1 tablespoons sugar.  1 L (34 oz) of water. Be sure to use exact measurements. Including too much sugar can make diarrhea worse. Drink -1 cup (120-240 mL) of oral rehydration solution each time you have diarrhea or vomit. If drinking this amount makes your vomiting worse, try drinking smaller amounts more often. For example, drink 1-3 tsp every 5-10 minutes.  A general rule for staying hydrated is to drink 1-2 L of fluid per day. Talk to your caregiver about the specific amount you should be drinking each day. Drink enough fluids to keep your urine clear or pale yellow. EATING WHEN DEHYDRATED  Even if you have had severe sweating or you are having diarrhea, do not stop eating. Many healthy items in a normal diet are okay to continue eating while recovering from dehydration. The following tips can help you to lessen nausea when you eat:  Ask someone else to prepare your food. Cooking smells may worsen nausea.  Eat in a well-ventilated room away from cooking smells.  Sit up when you eat. Avoid lying down until 1-2 hours after eating.  Eat small amounts when you eat.  Eat foods that are easy to digest. These include soft, well-cooked, or mashed foods. FOODS AND BEVERAGES TO AVOID Avoid eating or drinking the following foods and beverages that may increase nausea or further loss of fluid:     Fruit juices with a high sugar content, such as concentrated juices.  Alcohol.  Beverages containing caffeine.  Carbonated drinks. They may cause a lot of gas.  Foods that may cause a lot of gas, such as cabbage, broccoli, and beans.  Fatty, greasy, and fried foods.  Spicy, very salty, and very sweet foods or drinks.  Foods or drinks that are very hot or very cold. Consume food or drinks at or near room temperature.  Foods that need a lot of chewing, such as raw vegetables.  Foods that are sticky or hard to swallow, such as peanut butter.   This information is not intended to replace advice given to you by your health care provider. Make sure you discuss any questions you have with your health care provider.   Document Released: 04/26/2011 Document Revised: 10/27/2011 Document Reviewed: 04/26/2011 Elsevier Interactive Patient Education 2016 Elsevier Inc.  Sepsis, Adult Sepsis is a serious infection of your blood or tissues that affects your whole body. The infection that causes sepsis may be bacterial, viral, fungal, or parasitic. Sepsis may be life threatening. Sepsis can cause your blood pressure to drop. This may result in shock. Shock causes your central nervous system and your organs to stop working correctly.  RISK FACTORS Sepsis can happen in anyone, but it is more likely to happen in people who have weakened immune systems. SIGNS AND SYMPTOMS  Symptoms of sepsis can include:  Fever or low body temperature (hypothermia).  Rapid breathing (hyperventilation).  Chills.  Rapid heartbeat (tachycardia).  Confusion or light-headedness.  Trouble breathing.  Urinating much less than usual.  Cool, clammy skin or red, flushed skin.  Other problems with the heart, kidneys, or brain. DIAGNOSIS  Your health care provider will likely do tests to look for an infection, to see if the infection has spread to your blood, and to see how serious your condition is. Tests can  include:  Blood tests, including cultures of your blood.  Cultures of other fluids from your body, such as:  Urine.  Pus from wounds.  Mucus coughed up from your lungs.  Urine tests other than cultures.  X-ray exams or other imaging tests. TREATMENT  Treatment will begin with elimination of the source of infection. If your sepsis is likely caused by a bacterial or fungal infection, you will be given antibiotic or antifungal medicines. You may also receive:  Oxygen.  Fluids through an IV tube.  Medicines to increase your blood pressure.  A machine to clean your blood (dialysis) if your kidneys fail.  A machine to help you breathe if your lungs fail. SEEK IMMEDIATE MEDICAL CARE IF: You get an infection or develop any of the signs and symptoms of sepsis after surgery or a hospitalization.   This information is not intended to replace advice given to you by your health care provider. Make sure you discuss any questions you have with your health care provider.   Document Released: 10/31/2002 Document Revised: 06/18/2014 Document Reviewed: 10/09/2012 Elsevier Interactive Patient Education [employer].

## 2015-09-20 NOTE — Progress Notes (Signed)
Called [facility] and reported to scott.

## 2015-09-20 NOTE — Clinical Social Work Placement (Addendum)
   CLINICAL SOCIAL WORK PLACEMENT  NOTE 09/20/15 - DISCHARGED TO [facility]   Date:  09/20/2015  Patient Details  Name: [name] MRN: [id number] Date of Birth: 25-Apr-1935  Clinical Social Work is seeking post-discharge placement for this patient at the [facility] level of care (*CSW will initial, date and re-position this form in  chart as items are completed):  Yes   Patient/family provided with [facility]'s list of facilities offering this level of care within the geographic area requested by the patient (or if unable, by the patient's family).  Yes   Patient/family informed of their freedom to choose among providers that offer the needed level of care, that participate in Medicare, Medicaid or managed care program needed by the patient, have an available bed and are willing to accept the patient.  Yes   Patient/family informed of [facility]'s ownership interest in [facility] and [facility], as well as of the fact that they are under no obligation to receive care at these facilities.  PASRR submitted to EDS on       PASRR number received on       Existing PASRR number confirmed on 09/19/15     FL2 transmitted to all facilities in geographic area requested by pt/family on 09/19/15     FL2 transmitted to all facilities within larger geographic area on       Patient informed that his/her managed care company has contracts with or will negotiate with certain facilities, including the following:        Yes   Patient/family informed of bed offers received.  Patient chooses bed at  [facility]     Physician recommends and patient chooses bed at      Patient to be transferred to  [facility] on  09/20/15.  Patient to be transferred to facility by   ambulance    Patient/friend/family notified on  09/20/15 of transfer.  Name of family member notified:   Friend and [facility]CPOA [name], at the  bedside.  PHYSICIAN       Additional Comment:  09/20/15 - Received insurance authorization through [facility] - [id number], eff. 09/20/15.   _______________________________________________ Sable Feil, LCSW 09/20/2015, 2:24 PM

## 2015-09-20 NOTE — Discharge Summary (Signed)
Physician Discharge Summary  [name] [id number] DOB: 1935/09/05 DOA: 09/17/2015  PCP:  Melinda Crutch, MD  Admit date: 09/17/2015 Discharge date: 09/20/2015  Admitted From: Home Disposition: SNF  Recommendations for Outpatient Follow-up:  1. Follow up with PCP in 1-2 weeks 2. Please obtain BMP/CBC in one week  Discharge Condition: Stable CODE STATUS: FULL  Brief/Interim Summary: [name] a 80 y.o.femalewith medical history significant of NHL and breast cancer with recent (7/3-7/17/17) admission for septic shock from [facility]. She was discharged to SNF and patient has already transitioned to home. She reports someone called EMS because they hadn't seen her in a few days and they were concerned about her. She thinks this is ridiculous. She uses a wheelchair and has been home. She thinks she might have slept on the couch the last night or two. Hasn't had anything to drink the next few days because no one was able to bring it to her. Slid out of the wheelchair and had difficulty getting back up. Sore throat, not feeling good. Otherwise denies any symptoms of infection.  Assessment & Plan:  1. Severe Sepsis - RESOLVED: clinically improved,  source unclear but possibly due to urine infection although urine culture inconclusive, 1/2 blood culture positive for coag neg staph species, likely a contaminant from skin.  WBC trending down and afebrile.  Supportive care has been very helpful.  Finish 1 week of doxycycline. 2. UTI - yeast seen and started on oral fluconazole, urine culture inconclusive.   3. Leukocytosis - WBC trending down.    4. Hypokalemia - repleted.  5. Wheelchair bound with severe physical debilitation - Pt had been refusing SNF placement but has reluctantly agreed.  PT has strongly recommended SNF.  Pt to go to SNF today.  6. Chronic grade 1 diastolic CHF - appears clinically compensated at this time, most recent echo 7/17 showed preserved EF.    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 -   History of B-cell lymphoma   Leukocytosis   Wheelchair dependent   Hypokalemia   Hyperglycemia   Protein-calorie malnutrition (HCC)   Elevated alkaline phosphatase level   (HFpEF) heart failure with preserved ejection fraction [facility])  Discharge Instructions  Discharge Instructions    Increase activity slowly    Complete by:  As directed       Medication List    STOP taking these medications   Cinnamon [id number] MG Tabs   GARLIC PO     TAKE these medications   acetaminophen 500 MG tablet Commonly known as:  TYLENOL Take 1 tablet (500 mg total) by mouth every 6 (six) hours as needed (for pain.). What changed:  how much to take   amitriptyline 100 MG tablet Commonly known as:  ELAVIL Take 100 mg by mouth at bedtime.   aspirin 81 MG EC tablet Take 81 mg by mouth every morning.   atorvastatin 80 MG tablet Commonly known as:  LIPITOR Take 1 tablet (80 mg total) by mouth daily.   cholecalciferol 1000 units tablet Commonly known as:  VITAMIN D Take 1,000 Units by mouth every morning.   docusate sodium 100 MG capsule Commonly known as:  COLACE Take 1 capsule (100 mg total) by mouth 2 (two) times daily as needed for mild  constipation.   doxycycline 100 MG tablet Commonly known as:  VIBRA-TABS Take 1 tablet (100 mg total) by mouth every 12 (twelve) hours.   feeding supplement (ENSURE ENLIVE) Liqd Take 237 mLs by mouth 2 (two) times daily between meals.   Fish Oil 1000 MG Caps Take 1,000 mg by mouth every morning.   LORazepam 0.5 MG tablet Commonly known as:  ATIVAN Take 1 tablet (0.5 mg total) by mouth 2 (two) times daily as needed for anxiety  or sleep. What changed:  medication strength  how much to take  when to take this  reasons to take this   multivitamin-iron-minerals-folic acid chewable tablet Chew 1 tablet by mouth every morning.   nitroGLYCERIN 0.4 MG SL tablet Commonly known as:  NITROSTAT Place 1 tablet (0.4 mg total) under the tongue every 5 (five) minutes as needed for chest pain.   polyethylene glycol powder powder Commonly known as:  GLYCOLAX/MIRALAX Take 17 g by mouth 2 (two) times daily as needed. What changed:  reasons to take this      Follow-up Information     Melinda Crutch, MD. Schedule an appointment as soon as possible for a visit in 2 week(s).   Specialty:  Family Medicine Why:  Hospital Follow Up Contact information: [place] [address] [phone]          No Known Allergies  Procedures/Studies: US Abdomen Complete  Result Date: 08/31/2015 CLINICAL DATA:  80 year old female with elevated LFTs. History of lymphoma and cholecystectomy. EXAM: ABDOMEN ULTRASOUND COMPLETE COMPARISON:  08/28/2015 and prior CTs. FINDINGS: Gallbladder: Not visualized compatible with cholecystectomy. Common bile duct: Diameter: 3.2 mm. There is no evidence of intrahepatic or extrahepatic biliary dilatation. Liver: No focal lesion identified. Within normal limits in parenchymal echogenicity. IVC: No abnormality visualized. Pancreas: Visualized portion unremarkable. Spleen: Size and appearance within normal limits. Right Kidney: Length: 12 cm. Echogenicity within normal limits. No mass or hydronephrosis visualized. Left Kidney: Length: 12.8 cm. Echogenicity within normal limits. No mass or hydronephrosis visualized. Abdominal aorta: No aneurysm visualized but the distal abdominal aorta is not visualized secondary overlying bowel gas. Other findings: None. IMPRESSION: No evidence of acute or hepatic abnormality. Distal aorta not well visualized. Electronically Signed   By: Margarette Canada M.D.   On:  08/31/2015 14:51   Ct Abdomen Pelvis W Contrast  Result Date: 08/28/2015 CLINICAL DATA:  Intermittent abdominal pain. History of breast cancer in remission. History of non-Hodgkin's lymphoma in remission. History of CAD with stents. Stool incontinence and poor PO intake. EXAM: CT ABDOMEN AND PELVIS WITH CONTRAST TECHNIQUE: Multidetector CT imaging of the abdomen and pelvis was performed using the standard protocol following bolus administration of intravenous contrast. CONTRAST:  162mL ISOVUE-300 IOPAMIDOL (ISOVUE-300) INJECTION 61% COMPARISON:  08/18/2015 FINDINGS: Lower chest: Patchy infiltrates are identified in both lung bases. Small bilateral pleural effusions are noted. This represents a significant change since the previous exam. Hepatobiliary: Status post cholecystectomy. No focal abnormality identified within the liver. Pancreas: The pancreas has a normal appearance. Spleen: Normal in appearance. Renal/Adrenal: Adrenal glands are normal in appearance. There is symmetric enhancement and excretion from both kidneys. Small probable cysts are identified in the lower pole of the left kidney. Gastrointestinal tract: Small sliding-type hiatal hernia. Stomach otherwise is normal in appearance. Small bowel loops are normal in appearance. The appendix is well seen and has a normal appearance. There is moderate stool the mildly prominent loops of colon, not associated with obstructing mass. Reproductive: Stable appearance of endometrial  thickening. Left adnexal cyst is 5.5 centimeters in diameter, stable in appearance. No evidence for right adnexal lesion. No free pelvic fluid. Bladder: Normal in appearance. Vascular/Lymphatic: There is atherosclerosis of the abdominal aorta. Interval development of hazy mesentery and increased prominence of mesenteric lymph nodes. Largest lymph node in the mesenteric root is 2.0 x 2.3 centimeters. Musculoskeletal/Abdominal wall: Increased body wall edema. Small paraumbilical fat  containing hernia. Significant degenerative changes in the lower thoracic and lumbar spine. Sclerosis associated with degenerative changes. No suspicious lytic or blastic lesions. Other: No ascites. IMPRESSION: 1. Interval development of bilateral lower lobe pulmonary infiltrates and small pleural effusions. 2. Interval development of stranding in the mesenteric root and increased prominence of mesenteric adenopathy. Considerations include lymphoproliferative disease, infection, inflammation. 3. Persistent moderate stool burden without evidence for obstruction. 4. Stable appearance of endometrial thickening. 5. Increased body wall edema. Electronically Signed   By: Nolon Nations M.D.   On: 08/28/2015 16:47   Nm Pet Image Restag (ps) Skull Base To Thigh  Result Date: 09/08/2015 CLINICAL DATA:  Subsequent treatment strategy for non-Hodgkin's lymphoma. Diffuse large cell lymphoma. EXAM: NUCLEAR MEDICINE PET SKULL BASE TO THIGH TECHNIQUE: 10.8 mCi F-18 FDG was injected intravenously. Full-ring PET imaging was performed from the skull base to thigh after the radiotracer. CT data was obtained and used for attenuation correction and anatomic localization. FASTING BLOOD GLUCOSE:  Value: 147 mg/dl COMPARISON:  None. FINDINGS: NECK No hypermetabolic lymph nodes in the neck. CHEST Symmetric hilar metabolic activity associated with small lymph nodes ahve increased slightly with SUV max equals 7.5 compared to 5.5. Subcarinal lymph node increased in size and metabolic activity of SUV max equals 7.6 increased from 3.6. Lymph node measures 14 mm in short axis compared to 8 mm. There is new diffuse interstitial thickening and interlobular septal thickening within the LEFT RIGHT lower lobe. Small effusions are present. No focal metabolic activity in the lung parenchyma. ABDOMEN/PELVIS Periportal lymph node is decreased in metabolic activity with SUV max equal 5.8 compared to 6.2. Lesion is normal size. No new hypermetabolic  retroperitoneal nodes. No iliac lymphadenopathy. No abnormal metabolic activity the liver spleen. Kidneys and pancreas normal. Laxity of the LEFT lateral abdominal wall which is more prominent than prior. SKELETON No focal hypermetabolic activity to suggest skeletal metastasis. IMPRESSION: 1. Mild increase in metabolic activity of bilateral hilar lymph nodes and mediastinal lymph nodes. Differential includes reactive adenopathy versus recurrent lymphoma. 2. New interstitial thickening within the lower lobes and effusions. Findings are suggestive of congestive heart failure but cannot exclude pulmonary infection. 3. Stable hypermetabolic periportal lymph node which is normal size. 4. Normal spleen.  No iliac lymphadenopathy. 5. Normal bone marrow. These results will be called to the ordering clinician or representative by the Radiologist Assistant, and communication documented in the PACS or zVision Dashboard. Electronically Signed   By: Suzy Bouchard M.D.   On: 09/08/2015 18:06  Dg Chest Port 1 View  Result Date: 09/19/2015 CLINICAL DATA:  Sepsis. EXAM: PORTABLE CHEST 1 VIEW COMPARISON:  Radiograph of September 18, 2015. FINDINGS: Stable cardiomegaly. Atherosclerosis of thoracic aorta is noted. No pneumothorax is noted. Stable bibasilar opacities are noted concerning for edema . Stable small bilateral pleural effusions. Degenerative changes noted in the thoracic spine. IMPRESSION: Aortic atherosclerosis. Stable bibasilar opacities concerning for edema. Electronically Signed   By: Marijo Conception, M.D.   On: 09/19/2015 07:09   Dg Chest Port 1 View  Result Date: 09/18/2015 CLINICAL DATA:  Sepsis. Shortness of breath. Found  on floor in home. EXAM: PORTABLE CHEST 1 VIEW COMPARISON:  PET-CT 09/08/2015, chest radiographs 08/31/2015 FINDINGS: Patient is rotated. Unchanged cardiomegaly. Interstitial changes are seen, with mild decrease from prior radiographs suggesting improving edema. Small pleural effusions are  similar. Bibasilar opacities are also similar. No new focal airspace disease. No pneumothorax. Degenerative change in the spine. IMPRESSION: Unchanged cardiomegaly. Improved interstitial changes suggesting improving pulmonary edema. Pleural effusions and bibasilar opacities are similar. Electronically Signed   By: Jeb Levering M.D.   On: 09/18/2015 01:12   Dg Chest Port 1 View  Result Date: 08/31/2015 CLINICAL DATA:  Hypoxia. EXAM: PORTABLE CHEST 1 VIEW COMPARISON:  08/27/2015. FINDINGS: 1121 hours. The cardio pericardial silhouette is enlarged. Diffuse interstitial and bibasilar airspace disease persists, without substantial interval change. No overt airspace pulmonary edema. No substantial pleural effusion. Bones are demineralized. Telemetry leads overlie the chest. IMPRESSION: Cardiomegaly with underlying chronic interstitial changes. Degree of interstitial edema cannot be entirely excluded. Electronically Signed   By: Misty Stanley M.D.   On: 08/31/2015 11:47   Dg Chest Port 1 View  Result Date: 08/27/2015 CLINICAL DATA:  80 year old female with hypoxia. History of breast cancer and non-Hodgkin's lymphoma. EXAM: PORTABLE CHEST 1 VIEW COMPARISON:  08/22/2015 and prior radiographs FINDINGS: This is a mildly low volume film. Cardiomegaly is present. Bibasilar opacities have slightly increased - question atelectasis versus airspace disease. Mild interstitial opacities are again identified. No large pleural effusions or pneumothorax identified. IMPRESSION: Increasing bibasilar opacities -question atelectasis versus airspace disease/pneumonia. Mild interstitial opacities again noted which may represent mild edema. Electronically Signed   By: Margarette Canada M.D.   On: 08/27/2015 17:08   Dg Chest Port 1 View  Result Date: 08/22/2015 CLINICAL DATA:  Shortness of breath.  Wheezing. EXAM: PORTABLE CHEST 1 VIEW COMPARISON:  08/18/2015, 08/11/2015.  PET-CT 06/30/2015 FINDINGS: Mediastinum and hilar structures  normal. Stable cardiomegaly. Mild right upper lobe infiltrate cannot be excluded. Low lung volumes with mild bibasilar atelectasis. No prominent pleural effusion. No pneumothorax. Degenerative changes thoracic spine IMPRESSION: 1.  Mild right upper lobe infiltrate cannot be excluded. 2.  Mild bibasilar atelectasis and/or infiltrates. 3. Stable cardiomegaly . Electronically Signed   By: Marcello Moores  Register   On: 08/22/2015 09:27   Dg Abd Portable 1v  Result Date: 08/25/2015 CLINICAL DATA:  Constipation Hx colon polyps. Hx SBO. Hx hiatal hernia. Hx breast cancer. Hx gallbladder surgery. EXAM: PORTABLE ABDOMEN - 1 VIEW COMPARISON:  CT, 08/18/2015 FINDINGS: Normal bowel gas pattern with no evidence of obstruction or generalized adynamic ileus. No significant increase in colonic stool. No free air. Soft tissues are unremarkable. IMPRESSION: 1. No acute findings. No evidence of bowel obstruction or generalized adynamic ileus. No significant increase in colonic stool. Electronically Signed   By: Lajean Manes M.D.   On: 08/25/2015 12:37    Subjective: Pt feels much better, eating and drinking well.  No fever.  Agrees to go to SNF.    Discharge Exam: Vitals:   09/19/15 2129 09/20/15 0615  BP: (!) 117/48 (!) 127/48  Pulse: (!) 105 99  Resp: 20 18  Temp: 100 F (37.8 C) 98.6 F (37 C)   Vitals:   09/19/15 0548 09/19/15 1313 09/19/15 2129 09/20/15 0615  BP: (!) 107/50 109/61 (!) 117/48 (!) 127/48  Pulse: 99 98 (!) 105 99  Resp: 20 20 20 18   Temp: 98.8 F (37.1 C) 99.3 F (37.4 C) 100 F (37.8 C) 98.6 F (37 C)  TempSrc: Axillary Oral Oral Oral  SpO2:  96% 98% 97% 93%  Weight:      Height:       ? General:Appears calm and comfortable and is NAD; disheveled, poor dentition ? Eyes:PERRL, EOMI, normal lids, iris ? [id number] normal hearing, lips &tongue, MMM ? Neck:no LAD, masses or thyromegaly ? Cardiovascular:RRR, no m/r/g. No LE edema.  ? Respiratory:CTA bilaterally, no w/r/r.  Normal respiratory effort. ? Abdomen:soft, ntnd, NABS ? Skin:no rash or induration seen on limited exam other than an apparently chronic plaque on the R anterior shin ? Musculoskeletal:grossly normal tone BUE/BLE, good ROM, no bony abnormality ? Psychiatric:grossly normal mood and affect, speech fluent and appropriate, AOx3 ? Neurologic:CN 2-12 grossly intact, moves all extremities in coordinated fashion, sensation intact   The results of significant diagnostics from this hospitalization (including imaging, microbiology, ancillary and laboratory) are listed below for reference.     Microbiology: Recent Results (from the past 240 hour(s))  Culture, blood (Routine x 2)     Status: Abnormal (Preliminary result)   Collection Time: 09/18/15 12:48 AM  Result Value Ref Range Status   Specimen Description BLOOD LEFT ANTECUBITAL  Final   Special Requests BOTTLES DRAWN AEROBIC AND ANAEROBIC 10CC  Final   Culture  Setup Time   Final    GRAM POSITIVE COCCI IN CLUSTERS IN BOTH AEROBIC AND ANAEROBIC BOTTLES Organism ID to follow CRITICAL RESULT CALLED TO, READ BACK BY AND VERIFIED WITH: B GREEN PHARMD [id number] 09/19/15 A BROWNING Performed at [facility]    Culture STAPHYLOCOCCUS SPECIES (COAGULASE NEGATIVE) (A)  Final   Report Status PENDING  Incomplete  Blood Culture ID Panel (Reflexed)     Status: Abnormal   Collection Time: 09/18/15 12:48 AM  Result Value Ref Range Status   Enterococcus species NOT DETECTED NOT DETECTED Final   Vancomycin resistance NOT DETECTED NOT DETECTED Final   Listeria monocytogenes NOT DETECTED NOT DETECTED Final   Staphylococcus species DETECTED (A) NOT DETECTED Final    Comment: CRITICAL RESULT CALLED TO, READ BACK BY AND VERIFIED WITH: B GREEN PHARMD 0446 09/19/15 A BROWNING    Staphylococcus aureus NOT DETECTED NOT DETECTED Final   Methicillin resistance DETECTED (A) NOT DETECTED Final    Comment: CRITICAL RESULT CALLED TO, READ BACK BY AND VERIFIED  WITH: B GREEN PHARMD 0446 09/19/15 A BROWNING    Streptococcus species NOT DETECTED NOT DETECTED Final   Streptococcus agalactiae NOT DETECTED NOT DETECTED Final   Streptococcus pneumoniae NOT DETECTED NOT DETECTED Final   Streptococcus pyogenes NOT DETECTED NOT DETECTED Final   Acinetobacter baumannii NOT DETECTED NOT DETECTED Final   Enterobacteriaceae species NOT DETECTED NOT DETECTED Final   Enterobacter cloacae complex NOT DETECTED NOT DETECTED Final   Escherichia coli NOT DETECTED NOT DETECTED Final   Klebsiella oxytoca NOT DETECTED NOT DETECTED Final   Klebsiella pneumoniae NOT DETECTED NOT DETECTED Final   Proteus species NOT DETECTED NOT DETECTED Final   Serratia marcescens NOT DETECTED NOT DETECTED Final   Carbapenem resistance NOT DETECTED NOT DETECTED Final   Haemophilus influenzae NOT DETECTED NOT DETECTED Final   Neisseria meningitidis NOT DETECTED NOT DETECTED Final   Pseudomonas aeruginosa NOT DETECTED NOT DETECTED Final   Candida albicans NOT DETECTED NOT DETECTED Final   Candida glabrata NOT DETECTED NOT DETECTED Final   Candida krusei NOT DETECTED NOT DETECTED Final   Candida parapsilosis NOT DETECTED NOT DETECTED Final   Candida tropicalis NOT DETECTED NOT DETECTED Final    Comment: Performed at [facility]  Urine culture  Status: Abnormal   Collection Time: 09/18/15  1:28 AM  Result Value Ref Range Status   Specimen Description URINE, RANDOM  Final   Special Requests NONE  Final   Culture MULTIPLE SPECIES PRESENT, SUGGEST RECOLLECTION (A)  Final   Report Status 09/19/2015 FINAL  Final     Labs: BNP (last 3 results) No results for input(s): BNP in the last 8760 hours. Basic Metabolic Panel:  Recent Labs Lab 09/18/15 0048 09/19/15 0632  NA 137 137  K 3.3* 4.0  CL 107 112*  CO2 20* 20*  GLUCOSE 134* 119*  BUN 14 11  CREATININE 0.81 0.67  CALCIUM 8.5* 7.5*   Liver Function Tests:  Recent Labs Lab 09/18/15 0048 09/19/15 0632  AST 40  66*  ALT 41 50  ALKPHOS 178* 137*  BILITOT 1.0 0.8  PROT 6.3* 5.1*  ALBUMIN 3.2* 2.4*   No results for input(s): LIPASE, AMYLASE in the last 168 hours. No results for input(s): AMMONIA in the last 168 hours. CBC:  Recent Labs Lab 09/18/15 0048 09/19/15 0632  WBC 25.2* 13.9*  NEUTROABS 22.4* 10.5*  HGB 12.5 10.3*  HCT 38.8 32.6*  MCV 83.4 84.9  PLT 268 205   Cardiac Enzymes: No results for input(s): CKTOTAL, CKMB, CKMBINDEX, TROPONINI in the last 168 hours. BNP: Invalid input(s): POCBNP CBG: No results for input(s): GLUCAP in the last 168 hours. D-Dimer No results for input(s): DDIMER in the last 72 hours. Hgb A1c No results for input(s): HGBA1C in the last 72 hours. Lipid Profile No results for input(s): CHOL, HDL, LDLCALC, TRIG, CHOLHDL, LDLDIRECT in the last 72 hours. Thyroid function studies No results for input(s): TSH, T4TOTAL, T3FREE, THYROIDAB in the last 72 hours.  Invalid input(s): FREET3 Anemia work up No results for input(s): VITAMINB12, FOLATE, FERRITIN, TIBC, IRON, RETICCTPCT in the last 72 hours. Urinalysis    Component Value Date/Time   COLORURINE AMBER (A) 09/18/2015 0128   APPEARANCEUR TURBID (A) 09/18/2015 0128   LABSPEC 1.022 09/18/2015 0128   PHURINE 5.5 09/18/2015 0128   GLUCOSEU NEGATIVE 09/18/2015 0128   HGBUR TRACE (A) 09/18/2015 0128   BILIRUBINUR MODERATE (A) 09/18/2015 0128   KETONESUR NEGATIVE 09/18/2015 0128   PROTEINUR NEGATIVE 09/18/2015 0128   UROBILINOGEN 0.2 09/19/2012 1438   NITRITE NEGATIVE 09/18/2015 0128   LEUKOCYTESUR SMALL (A) 09/18/2015 0128   Sepsis Labs Invalid input(s): PROCALCITONIN,  WBC,  LACTICIDVEN Microbiology Recent Results (from the past 240 hour(s))  Culture, blood (Routine x 2)     Status: Abnormal (Preliminary result)   Collection Time: 09/18/15 12:48 AM  Result Value Ref Range Status   Specimen Description BLOOD LEFT ANTECUBITAL  Final   Special Requests BOTTLES DRAWN AEROBIC AND ANAEROBIC 10CC   Final   Culture  Setup Time   Final    GRAM POSITIVE COCCI IN CLUSTERS IN BOTH AEROBIC AND ANAEROBIC BOTTLES Organism ID to follow CRITICAL RESULT CALLED TO, READ BACK BY AND VERIFIED WITH: B GREEN PHARMD [id number] 09/19/15 A BROWNING Performed at [facility]    Culture STAPHYLOCOCCUS SPECIES (COAGULASE NEGATIVE) (A)  Final   Report Status PENDING  Incomplete  Blood Culture ID Panel (Reflexed)     Status: Abnormal   Collection Time: 09/18/15 12:48 AM  Result Value Ref Range Status   Enterococcus species NOT DETECTED NOT DETECTED Final   Vancomycin resistance NOT DETECTED NOT DETECTED Final   Listeria monocytogenes NOT DETECTED NOT DETECTED Final   Staphylococcus species DETECTED (A) NOT DETECTED Final    Comment:  CRITICAL RESULT CALLED TO, READ BACK BY AND VERIFIED WITH: B GREEN PHARMD 0446 09/19/15 A BROWNING    Staphylococcus aureus NOT DETECTED NOT DETECTED Final   Methicillin resistance DETECTED (A) NOT DETECTED Final    Comment: CRITICAL RESULT CALLED TO, READ BACK BY AND VERIFIED WITH: B GREEN PHARMD 0446 09/19/15 A BROWNING    Streptococcus species NOT DETECTED NOT DETECTED Final   Streptococcus agalactiae NOT DETECTED NOT DETECTED Final   Streptococcus pneumoniae NOT DETECTED NOT DETECTED Final   Streptococcus pyogenes NOT DETECTED NOT DETECTED Final   Acinetobacter baumannii NOT DETECTED NOT DETECTED Final   Enterobacteriaceae species NOT DETECTED NOT DETECTED Final   Enterobacter cloacae complex NOT DETECTED NOT DETECTED Final   Escherichia coli NOT DETECTED NOT DETECTED Final   Klebsiella oxytoca NOT DETECTED NOT DETECTED Final   Klebsiella pneumoniae NOT DETECTED NOT DETECTED Final   Proteus species NOT DETECTED NOT DETECTED Final   Serratia marcescens NOT DETECTED NOT DETECTED Final   Carbapenem resistance NOT DETECTED NOT DETECTED Final   Haemophilus influenzae NOT DETECTED NOT DETECTED Final   Neisseria meningitidis NOT DETECTED NOT DETECTED Final   Pseudomonas  aeruginosa NOT DETECTED NOT DETECTED Final   Candida albicans NOT DETECTED NOT DETECTED Final   Candida glabrata NOT DETECTED NOT DETECTED Final   Candida krusei NOT DETECTED NOT DETECTED Final   Candida parapsilosis NOT DETECTED NOT DETECTED Final   Candida tropicalis NOT DETECTED NOT DETECTED Final    Comment: Performed at [facility]  Urine culture     Status: Abnormal   Collection Time: 09/18/15  1:28 AM  Result Value Ref Range Status   Specimen Description URINE, RANDOM  Final   Special Requests NONE  Final   Culture MULTIPLE SPECIES PRESENT, SUGGEST RECOLLECTION (A)  Final   Report Status 09/19/2015 FINAL  Final   Time coordinating discharge: 35 minutes  SIGNED:  Irwin Brakeman, MD  [facility] 09/20/2015, 1:37 PM Pager   If 7PM-7AM, please contact night-coverage [email or web address] Password [email or web address]

## 2015-09-22 LAB — CULTURE, BLOOD (ROUTINE X 2)

## 2015-09-24 ENCOUNTER — Telehealth: Payer: Self-pay | Admitting: Internal Medicine

## 2015-09-24 NOTE — Telephone Encounter (Signed)
Noted.  Routing to [employer] as FYI.

## 2015-09-30 ENCOUNTER — Other Ambulatory Visit: Payer: Self-pay

## 2015-09-30 NOTE — Patient Outreach (Signed)
[name]) Care Management  09/30/2015  [name] May 05, 1935 [id number]      Telephone Screen  Referral Date: 09/29/15 Referral Source: MD office Referral Reason: " high risk for recurrent hospitalization, bactermia, cancer"   Outreach attempt #1 to patient. No answer and unable to leave voicemail message.     Plan: RN CM will make outreach attempt to patient within a week.   [name] Montgomery, RN,BSN,CCM [facility] Telephonic Care Management Coordinator Direct Phone: [phone] Toll Free: [phone] Fax: [phone]

## 2015-10-01 ENCOUNTER — Emergency Department (HOSPITAL_COMMUNITY): Payer: PPO

## 2015-10-01 ENCOUNTER — Observation Stay (HOSPITAL_COMMUNITY): Payer: PPO

## 2015-10-01 ENCOUNTER — Emergency Department (HOSPITAL_COMMUNITY)
Admit: 2015-10-01 | Discharge: 2015-10-01 | Disposition: A | Discharge status: Home / Self Care | Payer: PPO | Attending: Emergency Medicine | Admitting: Emergency Medicine

## 2015-10-01 ENCOUNTER — Inpatient Hospital Stay (HOSPITAL_COMMUNITY)
Admission: EM | Admit: 2015-10-01 | Discharge: 2015-10-17 | DRG: 166 | Disposition: E | Payer: PPO | Attending: Internal Medicine | Admitting: Internal Medicine

## 2015-10-01 ENCOUNTER — Encounter (HOSPITAL_COMMUNITY): Payer: Self-pay

## 2015-10-01 DIAGNOSIS — M2550 Pain in unspecified joint: Secondary | ICD-10-CM

## 2015-10-01 DIAGNOSIS — J189 Pneumonia, unspecified organism: Principal | ICD-10-CM

## 2015-10-01 DIAGNOSIS — Y95 Nosocomial condition: Secondary | ICD-10-CM | POA: Diagnosis present

## 2015-10-01 DIAGNOSIS — G9341 Metabolic encephalopathy: Secondary | ICD-10-CM | POA: Diagnosis not present

## 2015-10-01 DIAGNOSIS — I878 Other specified disorders of veins: Secondary | ICD-10-CM

## 2015-10-01 DIAGNOSIS — Z6841 Body Mass Index (BMI) 40.0 and over, adult: Secondary | ICD-10-CM

## 2015-10-01 DIAGNOSIS — R601 Generalized edema: Secondary | ICD-10-CM | POA: Diagnosis present

## 2015-10-01 DIAGNOSIS — J9621 Acute and chronic respiratory failure with hypoxia: Secondary | ICD-10-CM | POA: Diagnosis present

## 2015-10-01 DIAGNOSIS — I5031 Acute diastolic (congestive) heart failure: Secondary | ICD-10-CM | POA: Diagnosis present

## 2015-10-01 DIAGNOSIS — Z7982 Long term (current) use of aspirin: Secondary | ICD-10-CM

## 2015-10-01 DIAGNOSIS — R6521 Severe sepsis with septic shock: Secondary | ICD-10-CM | POA: Diagnosis not present

## 2015-10-01 DIAGNOSIS — R509 Fever, unspecified: Secondary | ICD-10-CM

## 2015-10-01 DIAGNOSIS — Z452 Encounter for adjustment and management of vascular access device: Secondary | ICD-10-CM

## 2015-10-01 DIAGNOSIS — J9600 Acute respiratory failure, unspecified whether with hypoxia or hypercapnia: Secondary | ICD-10-CM

## 2015-10-01 DIAGNOSIS — L03011 Cellulitis of right finger: Secondary | ICD-10-CM | POA: Diagnosis not present

## 2015-10-01 DIAGNOSIS — I959 Hypotension, unspecified: Secondary | ICD-10-CM | POA: Diagnosis not present

## 2015-10-01 DIAGNOSIS — Z993 Dependence on wheelchair: Secondary | ICD-10-CM

## 2015-10-01 DIAGNOSIS — D899 Disorder involving the immune mechanism, unspecified: Secondary | ICD-10-CM | POA: Diagnosis present

## 2015-10-01 DIAGNOSIS — F22 Delusional disorders: Secondary | ICD-10-CM

## 2015-10-01 DIAGNOSIS — L03113 Cellulitis of right upper limb: Secondary | ICD-10-CM | POA: Diagnosis present

## 2015-10-01 DIAGNOSIS — J9601 Acute respiratory failure with hypoxia: Secondary | ICD-10-CM

## 2015-10-01 DIAGNOSIS — S45292A Other specified injury of axillary or brachial vein, left side, initial encounter: Secondary | ICD-10-CM

## 2015-10-01 DIAGNOSIS — D72829 Elevated white blood cell count, unspecified: Secondary | ICD-10-CM | POA: Diagnosis not present

## 2015-10-01 DIAGNOSIS — I248 Other forms of acute ischemic heart disease: Secondary | ICD-10-CM | POA: Diagnosis present

## 2015-10-01 DIAGNOSIS — Z79899 Other long term (current) drug therapy: Secondary | ICD-10-CM

## 2015-10-01 DIAGNOSIS — E785 Hyperlipidemia, unspecified: Secondary | ICD-10-CM | POA: Diagnosis present

## 2015-10-01 DIAGNOSIS — I6523 Occlusion and stenosis of bilateral carotid arteries: Secondary | ICD-10-CM | POA: Diagnosis present

## 2015-10-01 DIAGNOSIS — Z66 Do not resuscitate: Secondary | ICD-10-CM | POA: Diagnosis not present

## 2015-10-01 DIAGNOSIS — K219 Gastro-esophageal reflux disease without esophagitis: Secondary | ICD-10-CM | POA: Diagnosis present

## 2015-10-01 DIAGNOSIS — Z515 Encounter for palliative care: Secondary | ICD-10-CM | POA: Diagnosis present

## 2015-10-01 DIAGNOSIS — M7989 Other specified soft tissue disorders: Secondary | ICD-10-CM | POA: Diagnosis present

## 2015-10-01 DIAGNOSIS — R609 Edema, unspecified: Secondary | ICD-10-CM

## 2015-10-01 DIAGNOSIS — Z853 Personal history of malignant neoplasm of breast: Secondary | ICD-10-CM

## 2015-10-01 DIAGNOSIS — R0602 Shortness of breath: Secondary | ICD-10-CM

## 2015-10-01 DIAGNOSIS — K5900 Constipation, unspecified: Secondary | ICD-10-CM | POA: Diagnosis present

## 2015-10-01 DIAGNOSIS — R740 Nonspecific elevation of levels of transaminase and lactic acid dehydrogenase [LDH]: Secondary | ICD-10-CM | POA: Diagnosis present

## 2015-10-01 DIAGNOSIS — R600 Localized edema: Secondary | ICD-10-CM

## 2015-10-01 DIAGNOSIS — L0390 Cellulitis, unspecified: Secondary | ICD-10-CM

## 2015-10-01 DIAGNOSIS — E872 Acidosis: Secondary | ICD-10-CM | POA: Diagnosis present

## 2015-10-01 DIAGNOSIS — R938 Abnormal findings on diagnostic imaging of other specified body structures: Secondary | ICD-10-CM | POA: Diagnosis not present

## 2015-10-01 DIAGNOSIS — E43 Unspecified severe protein-calorie malnutrition: Secondary | ICD-10-CM | POA: Diagnosis present

## 2015-10-01 DIAGNOSIS — J90 Pleural effusion, not elsewhere classified: Secondary | ICD-10-CM | POA: Diagnosis present

## 2015-10-01 DIAGNOSIS — N179 Acute kidney failure, unspecified: Secondary | ICD-10-CM

## 2015-10-01 DIAGNOSIS — R7989 Other specified abnormal findings of blood chemistry: Secondary | ICD-10-CM

## 2015-10-01 DIAGNOSIS — K567 Ileus, unspecified: Secondary | ICD-10-CM

## 2015-10-01 DIAGNOSIS — R52 Pain, unspecified: Secondary | ICD-10-CM | POA: Diagnosis not present

## 2015-10-01 DIAGNOSIS — R945 Abnormal results of liver function studies: Secondary | ICD-10-CM

## 2015-10-01 DIAGNOSIS — R739 Hyperglycemia, unspecified: Secondary | ICD-10-CM | POA: Diagnosis present

## 2015-10-01 DIAGNOSIS — I10 Essential (primary) hypertension: Secondary | ICD-10-CM | POA: Diagnosis present

## 2015-10-01 DIAGNOSIS — L8990 Pressure ulcer of unspecified site, unspecified stage: Secondary | ICD-10-CM | POA: Insufficient documentation

## 2015-10-01 DIAGNOSIS — Z8572 Personal history of non-Hodgkin lymphomas: Secondary | ICD-10-CM

## 2015-10-01 DIAGNOSIS — K7200 Acute and subacute hepatic failure without coma: Secondary | ICD-10-CM | POA: Diagnosis not present

## 2015-10-01 DIAGNOSIS — R34 Anuria and oliguria: Secondary | ICD-10-CM | POA: Diagnosis not present

## 2015-10-01 DIAGNOSIS — Z9049 Acquired absence of other specified parts of digestive tract: Secondary | ICD-10-CM

## 2015-10-01 DIAGNOSIS — C8330 Diffuse large B-cell lymphoma, unspecified site: Secondary | ICD-10-CM | POA: Diagnosis present

## 2015-10-01 DIAGNOSIS — F329 Major depressive disorder, single episode, unspecified: Secondary | ICD-10-CM | POA: Diagnosis present

## 2015-10-01 DIAGNOSIS — R9389 Abnormal findings on diagnostic imaging of other specified body structures: Secondary | ICD-10-CM

## 2015-10-01 DIAGNOSIS — F29 Unspecified psychosis not due to a substance or known physiological condition: Secondary | ICD-10-CM | POA: Diagnosis present

## 2015-10-01 DIAGNOSIS — A419 Sepsis, unspecified organism: Secondary | ICD-10-CM | POA: Diagnosis not present

## 2015-10-01 DIAGNOSIS — R29898 Other symptoms and signs involving the musculoskeletal system: Secondary | ICD-10-CM

## 2015-10-01 DIAGNOSIS — K210 Gastro-esophageal reflux disease with esophagitis: Secondary | ICD-10-CM | POA: Diagnosis present

## 2015-10-01 DIAGNOSIS — I2510 Atherosclerotic heart disease of native coronary artery without angina pectoris: Secondary | ICD-10-CM | POA: Diagnosis present

## 2015-10-01 DIAGNOSIS — E662 Morbid (severe) obesity with alveolar hypoventilation: Secondary | ICD-10-CM | POA: Diagnosis present

## 2015-10-01 DIAGNOSIS — Z955 Presence of coronary angioplasty implant and graft: Secondary | ICD-10-CM

## 2015-10-01 DIAGNOSIS — J029 Acute pharyngitis, unspecified: Secondary | ICD-10-CM | POA: Diagnosis present

## 2015-10-01 LAB — CBC WITH DIFFERENTIAL/PLATELET
BASOS ABS: 0 10*3/uL (ref 0.0–0.1)
Basophils Relative: 0 %
EOS PCT: 8 %
Eosinophils Absolute: 1.9 10*3/uL — ABNORMAL HIGH (ref 0.0–0.7)
HCT: 33.5 % — ABNORMAL LOW (ref 36.0–46.0)
HEMOGLOBIN: 10.8 g/dL — AB (ref 12.0–15.0)
LYMPHS PCT: 7 %
Lymphs Abs: 1.8 10*3/uL (ref 0.7–4.0)
MCH: 26.5 pg (ref 26.0–34.0)
MCHC: 32.2 g/dL (ref 30.0–36.0)
MCV: 82.1 fL (ref 78.0–100.0)
Monocytes Absolute: 0.9 10*3/uL (ref 0.1–1.0)
Monocytes Relative: 4 %
NEUTROS ABS: 19.9 10*3/uL — AB (ref 1.7–7.7)
NEUTROS PCT: 81 %
PLATELETS: 379 10*3/uL (ref 150–400)
RBC: 4.08 MIL/uL (ref 3.87–5.11)
RDW: 18.7 % — ABNORMAL HIGH (ref 11.5–15.5)
WBC: 24.4 10*3/uL — AB (ref 4.0–10.5)

## 2015-10-01 LAB — BASIC METABOLIC PANEL
ANION GAP: 6 (ref 5–15)
BUN: 14 mg/dL (ref 6–20)
CHLORIDE: 106 mmol/L (ref 101–111)
CO2: 24 mmol/L (ref 22–32)
Calcium: 8.3 mg/dL — ABNORMAL LOW (ref 8.9–10.3)
Creatinine, Ser: 0.69 mg/dL (ref 0.44–1.00)
Glucose, Bld: 133 mg/dL — ABNORMAL HIGH (ref 65–99)
POTASSIUM: 4 mmol/L (ref 3.5–5.1)
SODIUM: 136 mmol/L (ref 135–145)

## 2015-10-01 LAB — HEPATIC FUNCTION PANEL
ALK PHOS: 206 U/L — AB (ref 38–126)
ALT: 135 U/L — AB (ref 14–54)
AST: 129 U/L — AB (ref 15–41)
Albumin: 2.1 g/dL — ABNORMAL LOW (ref 3.5–5.0)
Bilirubin, Direct: 0.2 mg/dL (ref 0.1–0.5)
Indirect Bilirubin: 0.3 mg/dL (ref 0.3–0.9)
TOTAL PROTEIN: 4.7 g/dL — AB (ref 6.5–8.1)
Total Bilirubin: 0.5 mg/dL (ref 0.3–1.2)

## 2015-10-01 LAB — URINALYSIS, ROUTINE W REFLEX MICROSCOPIC
Bilirubin Urine: NEGATIVE
GLUCOSE, UA: NEGATIVE mg/dL
Ketones, ur: NEGATIVE mg/dL
LEUKOCYTES UA: NEGATIVE
Nitrite: NEGATIVE
PROTEIN: NEGATIVE mg/dL
SPECIFIC GRAVITY, URINE: 1.014 (ref 1.005–1.030)
pH: 6 (ref 5.0–8.0)

## 2015-10-01 LAB — RAPID STREP SCREEN (MED CTR MEBANE ONLY): STREPTOCOCCUS, GROUP A SCREEN (DIRECT): NEGATIVE

## 2015-10-01 LAB — CK: CK TOTAL: 22 U/L — AB (ref 38–234)

## 2015-10-01 LAB — I-STAT TROPONIN, ED: TROPONIN I, POC: 0.02 ng/mL (ref 0.00–0.08)

## 2015-10-01 LAB — BRAIN NATRIURETIC PEPTIDE: B NATRIURETIC PEPTIDE 5: 93.9 pg/mL (ref 0.0–100.0)

## 2015-10-01 LAB — URINE MICROSCOPIC-ADD ON

## 2015-10-01 LAB — PHOSPHORUS: Phosphorus: 4.2 mg/dL (ref 2.5–4.6)

## 2015-10-01 LAB — MAGNESIUM: Magnesium: 1.8 mg/dL (ref 1.7–2.4)

## 2015-10-01 LAB — I-STAT CG4 LACTIC ACID, ED: LACTIC ACID, VENOUS: 1.52 mmol/L (ref 0.5–1.9)

## 2015-10-01 MED ORDER — ACETAMINOPHEN 500 MG PO TABS
500.0000 mg | ORAL_TABLET | Freq: Four times a day (QID) | ORAL | Status: DC | PRN
  Administered 2015-10-02 – 2015-10-04 (×3): 500 mg via ORAL
  Filled 2015-10-01 (×4): qty 1

## 2015-10-01 MED ORDER — CEFEPIME HCL 1 G IJ SOLR
1.0000 g | Freq: Three times a day (TID) | INTRAMUSCULAR | Status: DC
  Administered 2015-10-01 – 2015-10-04 (×8): 1 g via INTRAVENOUS
  Filled 2015-10-01 (×9): qty 1

## 2015-10-01 MED ORDER — VITAMIN D3 25 MCG (1000 UNIT) PO TABS
1000.0000 [IU] | ORAL_TABLET | Freq: Every morning | ORAL | Status: DC
  Administered 2015-10-02 – 2015-10-14 (×13): 1000 [IU] via ORAL
  Filled 2015-10-01 (×13): qty 1

## 2015-10-01 MED ORDER — PHENOL 1.4 % MT LIQD
1.0000 | OROMUCOSAL | Status: DC | PRN
  Filled 2015-10-01: qty 177

## 2015-10-01 MED ORDER — POLYETHYLENE GLYCOL 3350 17 G PO PACK
17.0000 g | PACK | Freq: Every day | ORAL | Status: DC
  Administered 2015-10-02 – 2015-10-14 (×10): 17 g via ORAL
  Filled 2015-10-01 (×13): qty 1

## 2015-10-01 MED ORDER — ASPIRIN EC 81 MG PO TBEC
81.0000 mg | DELAYED_RELEASE_TABLET | Freq: Every day | ORAL | Status: DC
  Administered 2015-10-02 – 2015-10-13 (×12): 81 mg via ORAL
  Filled 2015-10-01 (×13): qty 1

## 2015-10-01 MED ORDER — QUINAPRIL HCL 10 MG PO TABS
20.0000 mg | ORAL_TABLET | Freq: Every day | ORAL | Status: DC
  Filled 2015-10-01: qty 2

## 2015-10-01 MED ORDER — ENOXAPARIN SODIUM 40 MG/0.4ML ~~LOC~~ SOLN
40.0000 mg | SUBCUTANEOUS | Status: DC
  Administered 2015-10-01 – 2015-10-11 (×10): 40 mg via SUBCUTANEOUS
  Filled 2015-10-01 (×11): qty 0.4

## 2015-10-01 MED ORDER — ATORVASTATIN CALCIUM 40 MG PO TABS
80.0000 mg | ORAL_TABLET | Freq: Every day | ORAL | Status: DC
  Administered 2015-10-02 – 2015-10-13 (×12): 80 mg via ORAL
  Filled 2015-10-01 (×12): qty 2

## 2015-10-01 MED ORDER — VANCOMYCIN HCL 10 G IV SOLR
1250.0000 mg | Freq: Two times a day (BID) | INTRAVENOUS | Status: DC
  Administered 2015-10-02 – 2015-10-03 (×4): 1250 mg via INTRAVENOUS
  Filled 2015-10-01 (×5): qty 1250

## 2015-10-01 MED ORDER — LORAZEPAM 0.5 MG PO TABS
0.5000 mg | ORAL_TABLET | Freq: Two times a day (BID) | ORAL | Status: DC | PRN
  Administered 2015-10-07 – 2015-10-08 (×2): 0.5 mg via ORAL
  Filled 2015-10-01 (×2): qty 1

## 2015-10-01 MED ORDER — POLYETHYLENE GLYCOL 3350 17 GM/SCOOP PO POWD: 17.0000 g | Freq: Every day | ORAL | Status: DC

## 2015-10-01 MED ORDER — LORAZEPAM 1 MG PO TABS
2.0000 mg | ORAL_TABLET | Freq: Two times a day (BID) | ORAL | Status: DC
  Administered 2015-10-01 – 2015-10-06 (×9): 2 mg via ORAL
  Filled 2015-10-01 (×9): qty 2

## 2015-10-01 MED ORDER — AMITRIPTYLINE HCL 100 MG PO TABS
100.0000 mg | ORAL_TABLET | Freq: Every day | ORAL | Status: DC
  Administered 2015-10-01 – 2015-10-05 (×5): 100 mg via ORAL
  Filled 2015-10-01 (×2): qty 1
  Filled 2015-10-01: qty 4
  Filled 2015-10-01: qty 1
  Filled 2015-10-01: qty 4
  Filled 2015-10-01 (×2): qty 1

## 2015-10-01 MED ORDER — LISINOPRIL 20 MG PO TABS
20.0000 mg | ORAL_TABLET | Freq: Every day | ORAL | Status: DC
  Administered 2015-10-02: 20 mg via ORAL
  Filled 2015-10-01: qty 1

## 2015-10-01 MED ORDER — NITROGLYCERIN 0.4 MG SL SUBL: 0.4000 mg | SUBLINGUAL_TABLET | SUBLINGUAL | Status: DC | PRN

## 2015-10-01 MED ORDER — DOCUSATE SODIUM 100 MG PO CAPS
100.0000 mg | ORAL_CAPSULE | Freq: Two times a day (BID) | ORAL | Status: DC | PRN
  Administered 2015-10-06: 100 mg via ORAL
  Filled 2015-10-01: qty 1

## 2015-10-01 NOTE — ED Provider Notes (Signed)
[facility] DEPT Provider Note   CSN: [id number] Arrival date & time: 10/11/2015  1539     History   Chief Complaint Chief Complaint  Patient presents with  . Other    Generalized Body Pain  . Sore Throat    HPI [name] is a 80 y.o. female.  HPI Patient has been in rehabilitation at [facility]. She reports that she has been getting increasing heaviness and difficulty moving her legs. She also reports that she's had pain in her joints, particularly her left elbow and shoulder. This has been somewhat migratory. She reports at one point the pain was over on the right side but now is to the left. She reports she noted swelling earlier but now the pain is swelling do seem improved compared to previously. She has not had a fever that she is aware of. The patient believes the symptoms are due to some problem with her roommate [place]. She thinks she is getting exposed to some sort of a chemical or the  air-conditioning problem that is responsible for her joint pain and the heavy feeling of her legs. Past Medical History:  Diagnosis Date  . Anxiety    takes Ativan daily  . Bilateral carotid artery stenosis    per last duplex --  RICA and LICA  93-81-%  . CAD (coronary artery disease)    cardiologist-  dr Stanford Breed  . Chronic chest pain   . Chronic headache   . Depression    takes Elavil nightly  . Diffuse large cell non-Hodgkin's lymphoma [facility]) oncologist-  dr Marin Olp-  per last office note being monitored for possible recurrence   dx 03/ 2015 by lymph node bx/  Stage 3-  completed chemotherapy  . Dyspnea   . GERD (gastroesophageal reflux disease)    takes Nexium daily  . Hiatal hernia   . History of breast cancer oncologist-  dr Marin Olp--  no recurrence   dx 2002-- right breast Stage 1/2 lobular,  ER+/HER2 negative /  s/p  right mastectomy w/ node dissection and chemotherapy  . History of colon polyps    hyperplastic polyp's  2003;  2006;  2011  . History of colon  polyps    hyperplastic 2003,  2006,  2011  . History of panic attacks   . History of small bowel obstruction    08/ 2014  parital sbo  resolved without surgical intervention  . Hyperlipidemia    takes Atorvastatin daily  . Hypertension    takes Quinapril daily  . Low back pain   . OSA (obstructive sleep apnea)    mild per study 10-14-2007-  cpap intolerent, uses O2 supplement at night  . Osteoarthritis   . PMB (postmenopausal bleeding)   . S/P drug eluting coronary stent placement    pLAD 07-09-2009  . Thickened endometrium     Patient Active Problem List   Diagnosis Date Noted  . Sepsis ([place]) 09/18/2015  . Abnormal PET scan of lung 09/18/2015  . Anxiety disorder 09/18/2015  . Carotid artery disease ([place]) 09/18/2015  . Contracture of knee 09/18/2015  . Acid reflux 09/18/2015  . H/O high risk medication treatment 09/18/2015  . History of colon polyps 09/18/2015  . Degenerative arthritis of spine 09/18/2015  . Bulky or enlarged uterus 09/18/2015  . Hypokalemia 09/18/2015  . Hyperglycemia 09/18/2015  . Protein-calorie malnutrition ([place]) 09/18/2015  . Elevated alkaline phosphatase level 09/18/2015  . (HFpEF) heart failure with preserved ejection fraction ([place]) 09/18/2015  . Septic shock (  [place]) 09/01/2015  . Leukocytosis 09/01/2015  . CAP (community acquired pneumonia) 09/01/2015  . Fluid overload 09/01/2015  . Wheelchair dependent 09/01/2015  . Morbid obesity ([place]) 09/01/2015  . Sleep apnea- declines CPAP 09/01/2015  . Endometrial thickening on ultra sound 09/01/2015  . Constipation by delayed colonic transit 09/01/2015  . Cerebrovascular disease 06/02/2015  . Abdominal pain 06/17/2014  . Bruit 06/07/2014  . Onychomycosis 05/21/2014  . Adnexal mass 01/17/2014  . Ovarian cancer screening 11/12/2013  . Port catheter in place 09/05/2013  . Elevated liver function tests 08/08/2013  . History of B-cell lymphoma 05/10/2013  . Delusional disorder ([place]) 02/15/2012  . EDEMA  10/23/2009  . Coronary atherosclerosis 08/11/2009  . ANXIETY 07/29/2008  . OSTEOARTHRITIS 07/29/2008  . DYSPNEA 10/24/2007  . Hyperlipidemia, mixed 09/22/2007  . ALLERGIC RHINITIS 09/22/2007  . 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 - 09/22/2007    Past Surgical History:  Procedure Laterality Date  . CARDIAC CATHETERIZATION  07-15-2010   dr Kathlyn [place]   no evidence obstructive cad/  patent LAD stent with no significant restenosis/  normal LVF  . CARDIOVASCULAR STRESS TEST  12-24-2009   normal nuclear study w/ no ischemia/  normal LV function and wall motion, ef 72%  . CATARACT EXTRACTION W/ INTRAOCULAR LENS  IMPLANT, BILATERAL  2011  . CHOLECYSTECTOMY  2005  . COLONOSCOPY WITH ESOPHAGOGASTRODUODENOSCOPY (EGD)  11-12-2014   polypectomy /  normal egd  . CORONARY ANGIOPLASTY WITH STENT PLACEMENT  07-09-2009  dr Darnell Level brodie   PCI and DES x1 to  pLAD/  normal LVF, ef 60%  . INNER EAR SURGERY    . KNEE ARTHROSCOPY Right   . MASTECTOMY WITH AXILLARY LYMPH NODE DISSECTION Right 01-03-2001  . PORTACATH PLACEMENT  06/04/13   and removal of previous pac  . SUBMANDIBULAR GLAND EXCISION Left 05/10/2013   Procedure: EXCISION LEFT SUBMANDIBULAR NODE;  Surgeon: Jodi Marble, MD;  Location: [facility];  Service: ENT;  Laterality: Left;  . TONSILLECTOMY    . TRANSTHORACIC ECHOCARDIOGRAM  10-04-2006   ef 55-605/  trivial MR and TR/  mild AV sclerosis without stenosis (valve area 1.93cm^2)    OB History    No data available       Home Medications    Prior to Admission medications   Medication Sig Start Date End Date Taking? Authorizing Provider  acetaminophen (TYLENOL) 500 MG tablet Take 1 tablet (500 mg total) by mouth every 6 (six) hours as needed (for pain.). 09/20/15  Yes [place] Marisa Hua, MD  amitriptyline (ELAVIL) 100 MG tablet Take 100 mg by mouth at bedtime.   Yes Historical Provider, MD  aspirin 81 MG EC tablet Take 81 mg by mouth daily with  breakfast.    Yes Historical Provider, MD  atorvastatin (LIPITOR) 80 MG tablet Take 1 tablet (80 mg total) by mouth daily. Patient taking differently: Take 80 mg by mouth daily with breakfast.  06/02/15  Yes Lelon Perla, MD  cholecalciferol (VITAMIN D) 1000 UNITS tablet Take 1,000 Units by mouth every morning.    Yes Historical Provider, MD  Cinnamon 500 MG capsule Take 1,000 mg by mouth 2 (two) times daily.   Yes Historical Provider, MD  docusate sodium (COLACE) 100 MG capsule Take 1 capsule (100 mg total) by mouth 2 (two) times daily as needed for mild constipation. 09/20/15  Yes [place] Marisa Hua, MD  GARLIC PO Take 1 tablet by mouth daily with breakfast.   Yes Historical Provider, MD  LORazepam (ATIVAN) 0.5 MG tablet Take 1 tablet (0.5 mg total) by mouth 2 (two) times daily as needed for anxiety or sleep. 09/20/15  Yes [place] Cyndie Mull, MD  LORazepam (ATIVAN) 2 MG tablet Take 2 mg by mouth 2 (two) times daily.   Yes Historical Provider, MD  Multiple Vitamin (MULTIVITAMIN WITH MINERALS) TABS tablet Take 1 tablet by mouth daily.   Yes Historical Provider, MD  nitroGLYCERIN (NITROSTAT) 0.4 MG SL tablet Place 1 tablet (0.4 mg total) under the tongue every 5 (five) minutes as needed for chest pain. 08/28/14  Yes Lewayne Bunting, MD  Omega-3 Fatty Acids (FISH OIL) 1000 MG CAPS Take 1,000 mg by mouth every morning.    Yes Historical Provider, MD  OXYGEN Inhale 2 L into the lungs continuous.   Yes Historical Provider, MD  polyethylene glycol powder (GLYCOLAX/MIRALAX) powder Take 17 g by mouth 2 (two) times daily as needed. Patient taking differently: Take 17 g by mouth daily.  05/21/14  Yes Peyton Najjar, MD  quinapril (ACCUPRIL) 20 MG tablet Take 20 mg by mouth daily with breakfast.   Yes Historical Provider, MD  feeding supplement, ENSURE ENLIVE, (ENSURE ENLIVE) LIQD Take 237 mLs by mouth 2 (two) times daily between meals. Patient not taking: Reported on 09/22/2015 09/20/15   Cleora Fleet, MD     Family History History reviewed. No pertinent family history.  Social History Social History  Substance Use Topics  . Smoking status: Never Smoker  . Smokeless tobacco: Never Used  . Alcohol use No     Allergies   Review of patient's allergies indicates no known allergies.   Review of Systems Review of Systems 10 Systems reviewed and are negative for acute change except as noted in the HPI.  Physical Exam Updated Vital Signs BP (!) 114/52 (BP Location: Left Arm)   Pulse 96   Temp 98 F (36.7 C) (Oral)   Resp 19   SpO2 94%   Physical Exam  Constitutional: She is oriented to person, place, and time.  Patient is morbidly obese and severely deconditioned. No respiratory distress at rest, patient is alert and oriented.  HENT:  Head: Normocephalic and atraumatic.  Right Ear: External ear normal.  Left Ear: External ear normal.  Nose: Nose normal.  Severe and extensive dental decay without facial swelling or lymphadenopathy.  Eyes: EOM are normal. Pupils are equal, round, and reactive to light.  Neck: Neck supple.  Cardiovascular: Normal rate, regular rhythm, normal heart sounds and intact distal pulses.   Pulmonary/Chest: Effort normal and breath sounds normal.  Abdominal: Soft. She exhibits no distension. There is no tenderness.  Morbid obesity. No rash within the skin folds.  Musculoskeletal:  Patient has 2+ pitting edema of the left upper extremity. There is no focal area of redness. There is no swelling or effusion around the joints. Range of motion is intact of the joints without pain. Patient does not endorse discomfort in the axilla. Bilateral lower extremities have 2+ pitting edema. No wounds or lesions on the feet. Patient appears chronically have some flexion and is very deconditioned.  Neurological: She is alert and oriented to person, place, and time. No cranial nerve deficit. She exhibits normal muscle tone.  Skin: Skin is warm and dry. No rash noted.   Back and buttocks have been examined. There is no open breakdown of skin. The skin of the sacrum and the perianal area is becoming somewhat violaceous but no active decubitus or disruption of skin service.  Psychiatric: She has a normal mood and affect.     ED Treatments / Results  Labs (all labs ordered are listed, but only abnormal results are displayed) Labs Reviewed  BASIC METABOLIC PANEL - Abnormal; Notable for the following:       Result Value   Glucose, Bld 133 (*)    Calcium 8.3 (*)    All other components within normal limits  CBC WITH DIFFERENTIAL/PLATELET - Abnormal; Notable for the following:    WBC 24.4 (*)    Hemoglobin 10.8 (*)    HCT 33.5 (*)    RDW 18.7 (*)    Neutro Abs 19.9 (*)    Eosinophils Absolute 1.9 (*)    All other components within normal limits  URINALYSIS, ROUTINE W REFLEX MICROSCOPIC (NOT AT [facility]) - Abnormal; Notable for the following:    APPearance CLOUDY (*)    Hgb urine dipstick SMALL (*)    All other components within normal limits  URINE MICROSCOPIC-ADD ON - Abnormal; Notable for the following:    Squamous Epithelial / LPF 6-30 (*)    Bacteria, UA FEW (*)    All other components within normal limits  RAPID STREP SCREEN (NOT AT [facility])  CULTURE, GROUP A STREP ([place])  CULTURE, BLOOD (ROUTINE X 2)  CULTURE, BLOOD (ROUTINE X 2)  BRAIN NATRIURETIC PEPTIDE  I-STAT TROPOININ, ED  I-STAT CG4 LACTIC ACID, ED    EKG  EKG Interpretation None       Radiology Dg Chest Port 1 View  Result Date: 10/16/2015 CLINICAL DATA:  Acute onset of shortness of breath, body aches and generalized weakness. Sore throat. Initial encounter. EXAM: PORTABLE CHEST 1 VIEW COMPARISON:  Chest radiograph performed 09/19/2015 FINDINGS: Vascular congestion is noted. Increased interstitial markings may reflect pulmonary edema, though pneumonia could have a similar appearance. No definite pleural effusion or pneumothorax is seen. The cardiomediastinal silhouette is mildly  enlarged. No acute osseous abnormalities are seen. Mild degenerative change is noted at the left glenohumeral joint. IMPRESSION: Vascular congestion and mild cardiomegaly. Increased interstitial markings may reflect pulmonary edema, though pneumonia could have a similar appearance. Electronically Signed   By: Garald Balding M.D.   On: 09/30/2015 18:40    Procedures Procedures (including critical care time)  Medications Ordered in ED Medications - No data to display   Initial Impression / Assessment and Plan / ED Course  I have reviewed the triage vital signs and the nursing notes.  Pertinent labs & imaging results that were available during my care of the patient were reviewed by me and considered in my medical decision making (see chart for details).  Clinical Course    Final Clinical Impressions(s) / ED Diagnoses   Final diagnoses:  Leukocytosis  Edema of upper extremity  Polyarthralgia  Severe muscle deconditioning  Morbid obesity, unspecified obesity type [facility])  Patient presents on above. She does have severe comorbid illness and extreme deconditioning. At this time, no evidence of DVT in the upper extremity. Also there is not significant erythema to suggest cellulitis at this time. Chest x-ray does have mild vascular congestion versus possible pneumonia by radiology. At this time I have lower suspicion of pneumonia and higher suspicion of CHF. Patient has peripheral edema and does not complain of cough or chest pain. Patient has severe limitations of mobility due to body habitus. At this time with combination of significant leukocytosis, unilateral upper extremity edema and polyarthralgia, the patient will be admitted to the hospital for observation and further diagnostic evaluation.  New Prescriptions New Prescriptions   No medications on file     Charlesetta Shanks, MD 09/19/2015 2016

## 2015-10-01 NOTE — ED Triage Notes (Addendum)
BIB EMS from [facility] w/ c/o generalized body pain + stiffness and sore throat that started x4 days ago. Pt denies SOB and dyspnea. Pt denies CP. Pt arrives A+OX4, speaking in complete sentences.

## 2015-10-01 NOTE — ED Notes (Signed)
US at bedside

## 2015-10-01 NOTE — H&P (Addendum)
History and Physical    [name] YUZ:[id number] DOB: 05-26-1935 DOA: 09/16/2015  PCP:  Duane Lope, MD  ONC: Dr. Myna Hidalgo  Patient coming from: SNF, [facility]  Chief Complaint: Joint pain, neck pain, sore throat, she is convinced that her symptoms are associated with facility  HPI: [name] is a 80 y.o. woman with a remote history of breast cancer, Non-Hodgkin's lymphoma (followed by Dr. Myna Hidalgo, patient reports recent PET scan x 2 suggestive of recurrent disease, due to see Dr. Myna Hidalgo again in October), CAD, HTN, HLD, grade I diastolic dysfunction, and chronic debility (she was wheelchair bound even prior to being admitted to SNF after her last discharge earlier this month).  She presents to the ED at [facility] today with multiple complaints.  She complains of sore throat but denies cough, fever.  No new rash.  She denies chest pain or shortness of breath.  She has been wearing oxygen 2L [place] continuously at [facility] (she reports that she was only wearing it qHS at home).  She is convinced that personnel there are not monitoring her O2 delivery system and changing out her tubing as they should.  She does not have a roommate.  She denies any known contacts with other residents with pulmonary or viral type illnesses.  She is also complaining of pain in multiple joints, including her right hand and wrist.  She denies trauma or injury.  She says that she is not actively participating in physical therapy so she denies over use.  She also complains of left arm pain and right anterior neck/shoulder pain.  She denies any known history of gout.  Again, she has not had fever.  No nausea or vomiting.  No change in bowel or bladder habits.  She reports that LE swelling is new and describes her legs as feeling like sandbags are tied to them.  She has not noticed new lymphadenopathy but she reports that she is actively being followed by Dr. Myna Hidalgo for two positive PET scans,  concerning for lymphoma recurrence.  During her last admission 8/2-8/5, she had severe sepsis attributed to UTI.  She also had a single positive blood culture, which was felt to be a contaminant.  ED Course: Patient has a leukocytosis to 24, but does not appear to be septic.  Normal lactic acid level.  Chest xray suggests vascular congestion that could be pulmonary edema or pneumonia.  Group A strep screen is pending.  Blood cultures have been drawn.  Hospitalist asked to admit for further evaluation and treatment.  Review of Systems: As per HPI otherwise 10 point review of systems negative.    Past Medical History:  Diagnosis Date  . Anxiety    takes Ativan daily  . Bilateral carotid artery stenosis    per last duplex --  RICA and LICA  40-59-%  . CAD (coronary artery disease)    cardiologist-  dr [name]  . Chronic chest pain   . Chronic headache   . Depression    takes Elavil nightly  . Diffuse large cell non-Hodgkin's lymphoma [facility]) oncologist-  dr Myna Hidalgo-  per last office note being monitored for possible recurrence   dx 03/ 2015 by lymph node bx/  Stage 3-  completed chemotherapy  . Dyspnea   . GERD (gastroesophageal reflux disease)    takes Nexium daily  . Hiatal hernia   . History of breast cancer oncologist-  dr Myna Hidalgo--  no recurrence   dx 2002-- right breast  Stage 1/2 lobular,  ER+/HER2 negative /  s/p  right mastectomy w/ node dissection and chemotherapy  . History of colon polyps    hyperplastic polyp's  2003;  2006;  2011  . History of colon polyps    hyperplastic 2003,  2006,  2011  . History of panic attacks   . History of small bowel obstruction    08/ 2014  parital sbo  resolved without surgical intervention  . Hyperlipidemia    takes Atorvastatin daily  . Hypertension    takes Quinapril daily  . Low back pain   . OSA (obstructive sleep apnea)    mild per study 10-14-2007-  cpap intolerent, uses O2 supplement at night  . Osteoarthritis   . PMB  (postmenopausal bleeding)   . S/P drug eluting coronary stent placement    pLAD 07-09-2009  . Thickened endometrium     Past Surgical History:  Procedure Laterality Date  . CARDIAC CATHETERIZATION  07-15-2010   dr Lorine Bears   no evidence obstructive cad/  patent LAD stent with no significant restenosis/  normal LVF  . CARDIOVASCULAR STRESS TEST  12-24-2009   normal nuclear study w/ no ischemia/  normal LV function and wall motion, ef 72%  . CATARACT EXTRACTION W/ INTRAOCULAR LENS  IMPLANT, BILATERAL  2011  . CHOLECYSTECTOMY  2005  . COLONOSCOPY WITH ESOPHAGOGASTRODUODENOSCOPY (EGD)  11-12-2014   polypectomy /  normal egd  . CORONARY ANGIOPLASTY WITH STENT PLACEMENT  07-09-2009  dr Smitty Cords brodie   PCI and DES x1 to  pLAD/  normal LVF, ef 60%  . INNER EAR SURGERY    . KNEE ARTHROSCOPY Right   . MASTECTOMY WITH AXILLARY LYMPH NODE DISSECTION Right 01-03-2001  . PORTACATH PLACEMENT  06/04/13   and removal of previous pac  . SUBMANDIBULAR GLAND EXCISION Left 05/10/2013   Procedure: EXCISION LEFT SUBMANDIBULAR NODE;  Surgeon: Flo Shanks, MD;  Location: [facility] OR;  Service: ENT;  Laterality: Left;  . TONSILLECTOMY    . TRANSTHORACIC ECHOCARDIOGRAM  10-04-2006   ef 55-605/  trivial MR and TR/  mild AV sclerosis without stenosis (valve area 1.93cm^2)     reports that she has never smoked. She has never used smokeless tobacco. She reports that she does not drink alcohol or use drugs.  Reports that she considers [employer] (friend) her next of kin and emergency contact.  No Known Allergies  History reviewed. No pertinent family history.  When questioned about family history, the patient responds that her family "has nothing to do with her" and family history is not relevant.  She denies any known family history of cancers, strokes, or heart disease.  Prior to Admission medications   Medication Sig Start Date End Date Taking? Authorizing Provider  acetaminophen (TYLENOL) 500 MG tablet  Take 1 tablet (500 mg total) by mouth every 6 (six) hours as needed (for pain.). 09/20/15  Yes Clanford Cyndie Mull, MD  amitriptyline (ELAVIL) 100 MG tablet Take 100 mg by mouth at bedtime.   Yes Historical Provider, MD  aspirin 81 MG EC tablet Take 81 mg by mouth daily with breakfast.    Yes Historical Provider, MD  atorvastatin (LIPITOR) 80 MG tablet Take 1 tablet (80 mg total) by mouth daily. Patient taking differently: Take 80 mg by mouth daily with breakfast.  06/02/15  Yes Lewayne Bunting, MD  cholecalciferol (VITAMIN D) 1000 UNITS tablet Take 1,000 Units by mouth every morning.    Yes Historical Provider, MD  Cinnamon 500 MG  capsule Take 1,000 mg by mouth 2 (two) times daily.   Yes Historical Provider, MD  docusate sodium (COLACE) 100 MG capsule Take 1 capsule (100 mg total) by mouth 2 (two) times daily as needed for mild constipation. 09/20/15  Yes Clanford Cyndie Mull, MD  GARLIC PO Take 1 tablet by mouth daily with breakfast.   Yes Historical Provider, MD  LORazepam (ATIVAN) 0.5 MG tablet Take 1 tablet (0.5 mg total) by mouth 2 (two) times daily as needed for anxiety or sleep. 09/20/15  Yes Clanford Cyndie Mull, MD  LORazepam (ATIVAN) 2 MG tablet Take 2 mg by mouth 2 (two) times daily.   Yes Historical Provider, MD  Multiple Vitamin (MULTIVITAMIN WITH MINERALS) TABS tablet Take 1 tablet by mouth daily.   Yes Historical Provider, MD  nitroGLYCERIN (NITROSTAT) 0.4 MG SL tablet Place 1 tablet (0.4 mg total) under the tongue every 5 (five) minutes as needed for chest pain. 08/28/14  Yes Lewayne Bunting, MD  Omega-3 Fatty Acids (FISH OIL) 1000 MG CAPS Take 1,000 mg by mouth every morning.    Yes Historical Provider, MD  OXYGEN Inhale 2 L into the lungs continuous.   Yes Historical Provider, MD  polyethylene glycol powder (GLYCOLAX/MIRALAX) powder Take 17 g by mouth 2 (two) times daily as needed. Patient taking differently: Take 17 g by mouth daily.  05/21/14  Yes Peyton Najjar, MD  quinapril (ACCUPRIL) 20  MG tablet Take 20 mg by mouth daily with breakfast.   Yes Historical Provider, MD    Physical Exam: Vitals:   09/30/2015 1544 09/30/2015 1550 10/04/2015 1806  BP:  129/70 (!) 114/52  Pulse:  96 96  Resp:  18 19  Temp:  98 F (36.7 C)   TempSrc:  Oral   SpO2: 96% 94% 94%      Constitutional: NAD, calm, comfortable, wearing O2 2L [place], morbidly obese and deconditioned so difficult for her to comply with attempts to examine Vitals:   09/27/2015 1544 09/30/2015 1550 10/16/2015 1806  BP:  129/70 (!) 114/52  Pulse:  96 96  Resp:  18 19  Temp:  98 F (36.7 C)   TempSrc:  Oral   SpO2: 96% 94% 94%   Eyes: pupils appear equal, round; lids and conjunctivae normal ENMT: Mucous membranes are moist. Posterior pharynx clear of any exudate or lesions. Poor dentition. Neck: normal appearance, supple, no masses, no apparent cervical LAD, no point tenderness with palpation over her C spine Respiratory: clear to auscultation listening anteriorly, no wheezing, no crackles. Normal respiratory effort. No accessory muscle use.  Cardiovascular: Normal rate, regular rhythm, no murmurs / rubs / gallops. 1+ edema in bilateral lower extremities.  2+ pedal pulses. GI: abdomen is obese and protuberant, mildly distended but compressible (patient feels this is normal).  No tenderness.  No masses palpated.  Bowel sounds are present. Musculoskeletal:  She has mild erythema and warmth over her first metacarpal joint in her right hand.  Mild tenderness to palpation.  No reduction in ROM at this joint.  No other joint deformities are apparent to me.  No axillary LAD appreciated.  No contractures.   Skin: no rashes, warm and dry Neurologic: CN 2-12 grossly intact. Sensation intact, Strength symmetric bilaterally, 5/5  Psychiatric: Normal judgment and insight. Alert and oriented x 3. Normal mood.     Labs on Admission: I have personally reviewed following labs and imaging studies  CBC:  Recent Labs Lab 09/22/2015 1726  WBC  24.4*  NEUTROABS 19.9*  HGB 10.8*  HCT 33.5*  MCV 82.1  PLT 379   Basic Metabolic Panel:  Recent Labs Lab 09/23/2015 1726  NA 136  K 4.0  CL 106  CO2 24  GLUCOSE 133*  BUN 14  CREATININE 0.69  CALCIUM 8.3*   GFR: Estimated Creatinine Clearance (by C-G formula based on SCr of 0.8 mg/dL) Female: 67.6 mL/min Female: 79.8 mL/min  Urine analysis:    Component Value Date/Time   COLORURINE YELLOW 09/17/2015 1756   APPEARANCEUR CLOUDY (A) 10/08/2015 1756   LABSPEC 1.014 10/05/2015 1756   PHURINE 6.0 09/16/2015 1756   GLUCOSEU NEGATIVE 10/13/2015 1756   HGBUR SMALL (A) 09/27/2015 1756   BILIRUBINUR NEGATIVE 10/16/2015 1756   KETONESUR NEGATIVE 09/23/2015 1756   PROTEINUR NEGATIVE 10/02/2015 1756   UROBILINOGEN 0.2 09/19/2012 1438   NITRITE NEGATIVE 10/04/2015 1756   LEUKOCYTESUR NEGATIVE 09/30/2015 1756   Sepsis Labs:  Lactic acid level 1.5  Radiological Exams on Admission: Dg Chest Port 1 View  Result Date: 10/14/2015 CLINICAL DATA:  Acute onset of shortness of breath, body aches and generalized weakness. Sore throat. Initial encounter. EXAM: PORTABLE CHEST 1 VIEW COMPARISON:  Chest radiograph performed 09/19/2015 FINDINGS: Vascular congestion is noted. Increased interstitial markings may reflect pulmonary edema, though pneumonia could have a similar appearance. No definite pleural effusion or pneumothorax is seen. The cardiomediastinal silhouette is mildly enlarged. No acute osseous abnormalities are seen. Mild degenerative change is noted at the left glenohumeral joint. IMPRESSION: Vascular congestion and mild cardiomegaly. Increased interstitial markings may reflect pulmonary edema, though pneumonia could have a similar appearance. Electronically Signed   By: Roanna Raider M.D.   On: 10/16/2015 18:40    EKG: Independently reviewed. NSR, no acute ST segment changes.  Assessment/Plan Principal Problem:   Leukocytosis Active Problems:   Coronary atherosclerosis    Edema   History of B-cell lymphoma   Abnormal chest x-ray   Cellulitis   Joint pain   Sore throat      Leukocytosis of unclear etiology but patient complains of sore throat, has an abnormal chest xray with recent hospitalization, and has skin changes in her right hand suggestive of at least mild cellulitis though septic arthritis would also be on the differential. --Blood cultures drawn in the ED.  Will start empiric Cefepime and vancomycin now, which would be sufficient coverage for HCAP per protocol as well as possible cellulitis. --I have ordered CT scan of her chest without contrast to follow-up abnormal chest xray.  Of note, she has not had fever, cough, or sputum production. --Added sed rate, CRP to admission labs --Also checking HIV, Influenza screen, urine legionella and streptococcal antigens --Rapid group A strep screen sent in the ED, pending --Ordered xrays of her right hand and wrist  --LUE ultrasound ordered in the ED, preliminary report documented that there was no evidence of DVT --If these additional labs/imaging do not support infectious etiology for her leukocytosis, consider consultation with Dr. Myna Hidalgo or partner for further evaluation of primary hematological disorder.  Leg swelling, heaviness --Will check bilateral LE dopplers to rule out DVT (unlikely) --Added hepatic function panel, BNP, CPK, mag, and phos levels to admission levels --Of note, last echo was in July and there was evidence of diastolic dysfunction then but preserved systolic function.  I did not order repeat echo for now.  She has not had chest pain or shortness of breath, so I did not order troponin.  I have a very low clinical index of suspicion for ACS at  this time  History of CAD --Continue ASA, ACE-I, statin  HLD --Continue statin, holding fish oil supplement for now  HTN --ACE-I  Anxiety --Elavil, ativan      DVT prophylaxis: Lovenox Code Status: FULL Family Communication:  Patient alone at time of admission in the ED Disposition Plan: To be determined. Consults called: NONE Admission status: OBSERVATION, med surg   TIME SPENT: 18 minutes   Eber Jones MD [facility] Pager [phone]  If 7PM-7AM, please contact night-coverage [email or web address] Password [facility]  10/05/2015, 8:48 PM

## 2015-10-01 NOTE — ED Notes (Signed)
20 min timer started.  

## 2015-10-01 NOTE — Progress Notes (Signed)
Pharmacy Antibiotic Note  [name] is a 80 y.o. female admitted on 10/16/2015 with suspected pneumonia.  Pharmacy has been consulted for Vancomycin dosing and antibiotic renal dose adjustment.  Plan:  Continue Cefepime 1g IV q8h  Vancomycin 1250 mg IV q12h.  Measure Vanc trough at steady state.  Follow up renal fxn, culture results, and clinical course.     Last weight documented as 93 kg (09/18/15)  Temp (24hrs), Avg:98.3 F (36.8 C), Min:98 F (36.7 C), Max:98.5 F (36.9 C)   Recent Labs Lab 09/19/2015 1726 09/28/2015 1941  WBC 24.4*  --   CREATININE 0.69  --   LATICACIDVEN  --  1.52    Estimated Creatinine Clearance (by C-G formula based on SCr of 0.8 mg/dL) Female: 65.3 mL/min Female: 79.8 mL/min    No Known Allergies  Antimicrobials this admission: 8/16 Cefepime >>  8/16 Vancomycin >>   Dose adjustments this admission:  Microbiology results: 8/16 BCx: sent 8/16 Rapid strep screen: negative 8/16 Group A strep cxt: ordered  Thank you for allowing pharmacy to be a part of this patient's care.  Gretta Arab PharmD, BCPS Pager [phone] 09/24/2015 10:08 PM

## 2015-10-01 NOTE — ED Notes (Signed)
Bed: [facility] Expected date:  Expected time:  Means of arrival:  Comments: 95F//gen pain

## 2015-10-01 NOTE — Progress Notes (Signed)
  VASCULAR LAB PRELIMINARY  PRELIMINARY  PRELIMINARY  PRELIMINARY  Left upper extremity venous duplex has been completed.    Left arm:  No evidence of DVT or superficial thrombosis.    Gave patient's nurse the results.  Janifer Adie, RVT, RDMS 10/03/2015, 7:08 PM

## 2015-10-02 ENCOUNTER — Observation Stay (HOSPITAL_COMMUNITY): Payer: PPO

## 2015-10-02 ENCOUNTER — Observation Stay (HOSPITAL_BASED_OUTPATIENT_CLINIC_OR_DEPARTMENT_OTHER): Payer: PPO

## 2015-10-02 ENCOUNTER — Inpatient Hospital Stay (HOSPITAL_COMMUNITY): Payer: PPO

## 2015-10-02 DIAGNOSIS — R34 Anuria and oliguria: Secondary | ICD-10-CM | POA: Diagnosis not present

## 2015-10-02 DIAGNOSIS — R52 Pain, unspecified: Secondary | ICD-10-CM | POA: Diagnosis present

## 2015-10-02 DIAGNOSIS — R609 Edema, unspecified: Secondary | ICD-10-CM

## 2015-10-02 DIAGNOSIS — N179 Acute kidney failure, unspecified: Secondary | ICD-10-CM | POA: Diagnosis not present

## 2015-10-02 DIAGNOSIS — R938 Abnormal findings on diagnostic imaging of other specified body structures: Secondary | ICD-10-CM | POA: Diagnosis not present

## 2015-10-02 DIAGNOSIS — I248 Other forms of acute ischemic heart disease: Secondary | ICD-10-CM | POA: Diagnosis present

## 2015-10-02 DIAGNOSIS — Y95 Nosocomial condition: Secondary | ICD-10-CM | POA: Diagnosis present

## 2015-10-02 DIAGNOSIS — J9621 Acute and chronic respiratory failure with hypoxia: Secondary | ICD-10-CM | POA: Diagnosis present

## 2015-10-02 DIAGNOSIS — I6523 Occlusion and stenosis of bilateral carotid arteries: Secondary | ICD-10-CM | POA: Diagnosis present

## 2015-10-02 DIAGNOSIS — G9341 Metabolic encephalopathy: Secondary | ICD-10-CM | POA: Diagnosis not present

## 2015-10-02 DIAGNOSIS — I878 Other specified disorders of veins: Secondary | ICD-10-CM | POA: Diagnosis not present

## 2015-10-02 DIAGNOSIS — Z515 Encounter for palliative care: Secondary | ICD-10-CM | POA: Diagnosis not present

## 2015-10-02 DIAGNOSIS — C8330 Diffuse large B-cell lymphoma, unspecified site: Secondary | ICD-10-CM | POA: Diagnosis present

## 2015-10-02 DIAGNOSIS — I950 Idiopathic hypotension: Secondary | ICD-10-CM | POA: Diagnosis not present

## 2015-10-02 DIAGNOSIS — D899 Disorder involving the immune mechanism, unspecified: Secondary | ICD-10-CM | POA: Diagnosis present

## 2015-10-02 DIAGNOSIS — R601 Generalized edema: Secondary | ICD-10-CM | POA: Diagnosis not present

## 2015-10-02 DIAGNOSIS — L03011 Cellulitis of right finger: Secondary | ICD-10-CM | POA: Diagnosis not present

## 2015-10-02 DIAGNOSIS — R509 Fever, unspecified: Secondary | ICD-10-CM | POA: Diagnosis not present

## 2015-10-02 DIAGNOSIS — D72829 Elevated white blood cell count, unspecified: Secondary | ICD-10-CM | POA: Diagnosis not present

## 2015-10-02 DIAGNOSIS — J189 Pneumonia, unspecified organism: Secondary | ICD-10-CM | POA: Diagnosis present

## 2015-10-02 DIAGNOSIS — E872 Acidosis: Secondary | ICD-10-CM | POA: Diagnosis present

## 2015-10-02 DIAGNOSIS — L8990 Pressure ulcer of unspecified site, unspecified stage: Secondary | ICD-10-CM | POA: Diagnosis not present

## 2015-10-02 DIAGNOSIS — E8770 Fluid overload, unspecified: Secondary | ICD-10-CM | POA: Diagnosis not present

## 2015-10-02 DIAGNOSIS — J9600 Acute respiratory failure, unspecified whether with hypoxia or hypercapnia: Secondary | ICD-10-CM | POA: Diagnosis not present

## 2015-10-02 DIAGNOSIS — E43 Unspecified severe protein-calorie malnutrition: Secondary | ICD-10-CM | POA: Diagnosis present

## 2015-10-02 DIAGNOSIS — L03113 Cellulitis of right upper limb: Secondary | ICD-10-CM | POA: Diagnosis present

## 2015-10-02 DIAGNOSIS — K7200 Acute and subacute hepatic failure without coma: Secondary | ICD-10-CM | POA: Diagnosis not present

## 2015-10-02 DIAGNOSIS — J9601 Acute respiratory failure with hypoxia: Secondary | ICD-10-CM | POA: Diagnosis not present

## 2015-10-02 DIAGNOSIS — I951 Orthostatic hypotension: Secondary | ICD-10-CM | POA: Diagnosis not present

## 2015-10-02 DIAGNOSIS — Z66 Do not resuscitate: Secondary | ICD-10-CM | POA: Diagnosis not present

## 2015-10-02 DIAGNOSIS — I5031 Acute diastolic (congestive) heart failure: Secondary | ICD-10-CM | POA: Diagnosis present

## 2015-10-02 DIAGNOSIS — N390 Urinary tract infection, site not specified: Secondary | ICD-10-CM | POA: Diagnosis not present

## 2015-10-02 DIAGNOSIS — J90 Pleural effusion, not elsewhere classified: Secondary | ICD-10-CM | POA: Diagnosis present

## 2015-10-02 DIAGNOSIS — R6521 Severe sepsis with septic shock: Secondary | ICD-10-CM | POA: Diagnosis not present

## 2015-10-02 DIAGNOSIS — Z6841 Body Mass Index (BMI) 40.0 and over, adult: Secondary | ICD-10-CM | POA: Diagnosis not present

## 2015-10-02 DIAGNOSIS — E662 Morbid (severe) obesity with alveolar hypoventilation: Secondary | ICD-10-CM | POA: Diagnosis present

## 2015-10-02 DIAGNOSIS — J029 Acute pharyngitis, unspecified: Secondary | ICD-10-CM | POA: Diagnosis not present

## 2015-10-02 DIAGNOSIS — R7989 Other specified abnormal findings of blood chemistry: Secondary | ICD-10-CM | POA: Diagnosis not present

## 2015-10-02 DIAGNOSIS — Z8572 Personal history of non-Hodgkin lymphomas: Secondary | ICD-10-CM | POA: Diagnosis not present

## 2015-10-02 DIAGNOSIS — A419 Sepsis, unspecified organism: Secondary | ICD-10-CM | POA: Diagnosis not present

## 2015-10-02 DIAGNOSIS — K567 Ileus, unspecified: Secondary | ICD-10-CM | POA: Diagnosis present

## 2015-10-02 LAB — HIV ANTIBODY (ROUTINE TESTING W REFLEX): HIV Screen 4th Generation wRfx: NONREACTIVE

## 2015-10-02 LAB — SEDIMENTATION RATE: SED RATE: 25 mm/h — AB (ref 0–22)

## 2015-10-02 LAB — STREP PNEUMONIAE URINARY ANTIGEN: Strep Pneumo Urinary Antigen: NEGATIVE

## 2015-10-02 MED ORDER — SODIUM CHLORIDE 0.9 % IV SOLN
Freq: Once | INTRAVENOUS | Status: AC
  Administered 2015-10-02: 22:00:00 via INTRAVENOUS

## 2015-10-02 MED ORDER — SODIUM CHLORIDE 0.9% FLUSH
10.0000 mL | INTRAVENOUS | Status: DC | PRN
  Administered 2015-10-03 – 2015-10-04 (×2): 10 mL
  Filled 2015-10-02 (×2): qty 40

## 2015-10-02 NOTE — Progress Notes (Signed)
Called to start a PIV however this pt is limited to the left arm only.   IV Team has a great deal of difficulty obtaining peripheral IV access even with Ultrasound.   Would a PICC be a consideration since she is getting Vancomycin?  Did not know what the long term plan was for her as far as length of stay, meds, etc.  Thanks.

## 2015-10-02 NOTE — Progress Notes (Signed)
Call received from [place].  Told that patient had refused [place] earlier this am and would pt be willing to have procedure tonight.  Dr. Wendee Beavers notified.  Instructed to make pt NPO and attempt to do procedure tonight if possible.

## 2015-10-02 NOTE — Progress Notes (Addendum)
Patient refused blood draw this morning despite 3 attempts with education.

## 2015-10-02 NOTE — Progress Notes (Signed)
*  PRELIMINARY RESULTS* Vascular Ultrasound Lower extremity venous duplex has been completed.  Preliminary findings: Technically difficult study due to patient position and body habitus. No obvious evidence of DVT in visualized veins or baker's cyst.  Landry Mellow, RDMS, RVT  10/02/2015, 9:42 AM

## 2015-10-02 NOTE — Progress Notes (Signed)
Peripherally Inserted Central Catheter/Midline Placement  The IV Nurse has discussed with the patient and/or persons authorized to consent for the patient, the purpose of this procedure and the potential benefits and risks involved with this procedure.  The benefits include less needle sticks, lab draws from the catheter and patient may be discharged home with the catheter.  Risks include, but not limited to, infection, bleeding, blood clot (thrombus formation), and puncture of an artery; nerve damage and irregular heat beat.  Alternatives to this procedure were also discussed.  PICC/Midline Placement Documentation        Leland Staszewski, Nicolette Bang 10/02/2015, 2:55 PM

## 2015-10-02 NOTE — Progress Notes (Signed)
Contacted MD about pt current vital signs to determine whether or not to call a code sepsis. Orders received, will bolus pt with 500cc, no code sepsis called.   Vitals before bolus: 2105 BP 78/39, Pulse 98, Temp 98, RR 26, O2 96% on 2L                                 2158 BP 85/43  Vitals post bolus: 2300 BP 81/40, P 100  MD notified.

## 2015-10-02 NOTE — Progress Notes (Signed)
PROGRESS NOTE    [name]  [id number] DOB: 1935-11-20 DOA: 09/17/2015 PCP:  Melinda Crutch, MD   Brief Narrative:     Assessment & Plan:   Principal Problem:   Leukocytosis/Cellulitis - We'll continue current antibiotic regimen - Continue supportive therapy  Active Problems:   Coronary atherosclerosis - Stable no chest pain reported    Edema - Stable   DVT prophylaxis: Lovenox Code Status: Full Family Communication: None at bedside Disposition Plan: Pending improvement in condition   Consultants:   None   Procedures: None   Antimicrobials: Vancomycin and cefepime   Subjective: Patient has no new complaints. No acute issues overnight  Objective: Vitals:   10/02/15 0434 10/02/15 0600 10/02/15 1317 10/02/15 1500  BP: (!) 122/55  (!) 86/42 (!) 104/42  Pulse: 94  100   Resp: (!) 32  (!) 22   Temp:      TempSrc: Axillary     SpO2: 98%  93%   Weight:  93 kg (205 lb 0.4 oz)    Height:  5\' 5"  (1.651 m)      Intake/Output Summary (Last 24 hours) at 10/02/15 1651 Last data filed at 10/02/15 1000  Gross per 24 hour  Intake                0 ml  Output              700 ml  Net             -700 ml   Filed Weights   10/02/15 0600  Weight: 93 kg (205 lb 0.4 oz)    Examination:  General exam: Appears calm and comfortable  Respiratory system: Clear to auscultation. Respiratory effort normal. Cardiovascular system: S1 & S2 heard, RRR. No JVD, murmurs, rubs, gallops or clicks. No pedal edema. Gastrointestinal system: Abdomen is nondistended, soft and nontender. No organomegaly or masses felt. Normal bowel sounds heard. Central nervous system: Alert and oriented. No focal neurological deficits. Extremities: Symmetric 5 x 5 power. Skin: No rashes, lesions or ulcers Psychiatry: Judgement and insight appear normal. Mood & affect appropriate.     Data Reviewed: I have personally reviewed following labs and imaging studies  CBC:  Recent Labs Lab  10/02/2015 1726  WBC 24.4*  NEUTROABS 19.9*  HGB 10.8*  HCT 33.5*  MCV 82.1  PLT [id number]   Basic Metabolic Panel:  Recent Labs Lab 10/06/2015 1726 09/25/2015 2249  NA 136  --   K 4.0  --   CL 106  --   CO2 24  --   GLUCOSE 133*  --   BUN 14  --   CREATININE 0.69  --   CALCIUM 8.3*  --   MG  --  1.8  PHOS  --  4.2   GFR: Estimated Creatinine Clearance (by C-G formula based on SCr of 0.8 mg/dL) Female: 65.3 mL/min Female: 79.8 mL/min Liver Function Tests:  Recent Labs Lab 09/20/2015 2249  AST 129*  ALT 135*  ALKPHOS 206*  BILITOT 0.5  PROT 4.7*  ALBUMIN 2.1*   No results for input(s): LIPASE, AMYLASE in the last 168 hours. No results for input(s): AMMONIA in the last 168 hours. Coagulation Profile: No results for input(s): INR, PROTIME in the last 168 hours. Cardiac Enzymes:  Recent Labs Lab 10/04/2015 2249  CKTOTAL 22*   BNP (last 3 results) No results for input(s): PROBNP in the last 8760 hours. HbA1C: No results for input(s): HGBA1C in the last 72 hours.  CBG: No results for input(s): GLUCAP in the last 168 hours. Lipid Profile: No results for input(s): CHOL, HDL, LDLCALC, TRIG, CHOLHDL, LDLDIRECT in the last 72 hours. Thyroid Function Tests: No results for input(s): TSH, T4TOTAL, FREET4, T3FREE, THYROIDAB in the last 72 hours. Anemia Panel: No results for input(s): VITAMINB12, FOLATE, FERRITIN, TIBC, IRON, RETICCTPCT in the last 72 hours. Sepsis Labs:  Recent Labs Lab 10/16/2015 1941  LATICACIDVEN 1.52    Recent Results (from the past 240 hour(s))  Rapid strep screen     Status: None   Collection Time: 09/30/2015  4:00 PM  Result Value Ref Range Status   Streptococcus, Group A Screen (Direct) NEGATIVE NEGATIVE Final    Comment: (NOTE) A Rapid Antigen test may result negative if the antigen level in the sample is below the detection level of this test. The FDA has not cleared this test as a stand-alone test therefore the rapid antigen negative result has  reflexed to a Group A Strep culture.   Culture, group A strep     Status: None (Preliminary result)   Collection Time: 09/22/2015  4:00 PM  Result Value Ref Range Status   Specimen Description THROAT  Final   Special Requests NONE Reflexed from [id number]  Final   Culture   Final    TOO YOUNG TO READ Performed at [facility]    Report Status PENDING  Incomplete  Culture, blood (routine x 2)     Status: None (Preliminary result)   Collection Time: 09/16/2015  6:19 PM  Result Value Ref Range Status   Specimen Description BLOOD BLOOD LEFT ARM  Final   Special Requests BOTTLES DRAWN AEROBIC AND ANAEROBIC 10CC  Final   Culture   Final    NO GROWTH < 24 HOURS Performed at [facility]    Report Status PENDING  Incomplete  Culture, blood (routine x 2)     Status: None (Preliminary result)   Collection Time: 10/14/2015  6:19 PM  Result Value Ref Range Status   Specimen Description BLOOD BLOOD LEFT ARM  Final   Special Requests BOTTLES DRAWN AEROBIC AND ANAEROBIC 10CC  Final   Culture   Final    NO GROWTH < 24 HOURS Performed at [facility]    Report Status PENDING  Incomplete         Radiology Studies: Ct Chest Wo Contrast  Result Date: 10/02/2015 CLINICAL DATA:  80 y/o F; history of breast cancer, non-Hodgkin's lymphoma, and coronary artery disease presenting with abnormal chest radiograph. No cough or chest pain. EXAM: CT CHEST WITHOUT CONTRAST TECHNIQUE: Multidetector CT imaging of the chest was performed following the standard protocol without IV contrast. COMPARISON:  Chest CT dated 07/10/2014.  PET-CT dated 09/08/2015. FINDINGS: Cardiovascular: Normal heart size. Small pericardial effusion. Extensive coronary artery calcifications predominantly in the LAD. Aortic valvular calcification. Aorta and main pulmonary artery are normal in size. Moderate calcific atherosclerosis of the thoracic aorta. Mediastinum/Nodes: Mediastinal lymphadenopathy in prevascular, AP  window, right paratracheal, and subcarinal spaces the largest in the right peritracheal region measuring 15 mm short axis (series 2, image 44) grossly stable in comparison the prior PET-CT. Lungs/Pleura: Small bilateral pleural effusions.There is diffuse septal thickening and ground-glass opacities of lung parenchyma. There is no consolidation. There may be a background of reticulation at the peripheries of the lung which may represent a component of mild fibrosis. 8 mm nodule in the left upper lobe increased in size from PET-CT and prior chest CT where it  had a triangular appearance is probably an intrapulmonary lymph node (series 5, image 49) Upper Abdomen:  Status post cholecystectomy. Musculoskeletal: No chest wall mass or suspicious bone lesions identified. IMPRESSION: 1. Diffuse septal thickening and ground-glass opacity of the lung probably represents chronic interstitial pulmonary edema. Atypical pneumonia is considered less likely. No consolidation. 2. Possible background of reticulation of the lung peripheries may represent a component of fibrosis. 3. Small bilateral pleural effusions and small pericardial effusion. 4. Mediastinal lymphadenopathy grossly stable from prior PET-CT. Electronically Signed   By: Kristine Garbe M.D.   On: 10/02/2015 01:17   Dg Hand 2 View Right  Result Date: 10/12/2015 CLINICAL DATA:  Chronic right hand pain.  Initial encounter. EXAM: RIGHT HAND - 2 VIEW COMPARISON:  Right thumb radiographs performed 02/25/2012 FINDINGS: There is no evidence of fracture or dislocation. Minimal degenerative change is noted at the first carpometacarpal joint. The carpal rows are intact, and demonstrate normal alignment. Mild soft tissue swelling is noted about the metacarpophalangeal joints and proximal fingers. IMPRESSION: No evidence of fracture or dislocation. Electronically Signed   By: Garald Balding M.D.   On: 09/18/2015 22:57   Dg Chest Port 1 View  Result Date:  10/07/2015 CLINICAL DATA:  Acute onset of shortness of breath, body aches and generalized weakness. Sore throat. Initial encounter. EXAM: PORTABLE CHEST 1 VIEW COMPARISON:  Chest radiograph performed 09/19/2015 FINDINGS: Vascular congestion is noted. Increased interstitial markings may reflect pulmonary edema, though pneumonia could have a similar appearance. No definite pleural effusion or pneumothorax is seen. The cardiomediastinal silhouette is mildly enlarged. No acute osseous abnormalities are seen. Mild degenerative change is noted at the left glenohumeral joint. IMPRESSION: Vascular congestion and mild cardiomegaly. Increased interstitial markings may reflect pulmonary edema, though pneumonia could have a similar appearance. Electronically Signed   By: Garald Balding M.D.   On: 09/24/2015 18:40    Scheduled Meds: . amitriptyline  100 mg Oral QHS  . aspirin EC  81 mg Oral Q breakfast  . atorvastatin  80 mg Oral Q breakfast  . ceFEPime (MAXIPIME) IV  1 g Intravenous Q8H  . cholecalciferol  1,000 Units Oral q morning - 10a  . enoxaparin (LOVENOX) injection  40 mg Subcutaneous Q24H  . lisinopril  20 mg Oral QAC breakfast  . LORazepam  2 mg Oral BID  . polyethylene glycol  17 g Oral Daily  . vancomycin  1,250 mg Intravenous Q12H   Continuous Infusions:    LOS: 0 days   Time spent: > 35 minutes  Velvet Bathe, MD [facility] Pager [phone]  If 7PM-7AM, please contact night-coverage [email or web address] Password [facility] 10/02/2015, 4:51 PM

## 2015-10-02 NOTE — Progress Notes (Signed)
Initial Nutrition Assessment  DOCUMENTATION CODES:   Obesity unspecified  INTERVENTION:  Continue 2-3 meals per day with emphasis on adequate protein intake.   RD will continue to monitor.    NUTRITION DIAGNOSIS:   Increased nutrient needs (protein) related to wound healing as evidenced by other (see comment) (bilateral buttocks abrasions, MSAD, serous blister left arm).  GOAL:   Patient will meet greater than or equal to 90% of their needs  MONITOR:   PO intake, Skin, Weight trends  REASON FOR ASSESSMENT:   Malnutrition Screening Tool    ASSESSMENT:   [name] is a 80 y.o. woman with a remote history of breast cancer, Non-Hodgkin's lymphoma (followed by Dr. Marin Olp, patient reports recent PET scan x 2 suggestive of recurrent disease, due to see Dr. Marin Olp again in October), CAD, HTN, HLD, grade I diastolic dysfunction, and chronic debility (she was wheelchair bound even prior to being admitted to SNF after her last discharge earlier this month). During her last admission 8/2-8/5, she had severe sepsis attributed to UTI. Patient now presents with suspected pneumonia.   Patient reports she has had a great appetite and has been eating 100% of her meals PTA. She typically eats only breakfast and dinner. At time of assessment diet had just been advanced to Heart Healthy, so patient ordered wheat toast with jelly, eggs, orange, grapes, pineapple chunks, chocolate milk, and gingerale. She believes UBW is 215 lbs, and thinks she has been gaining weight. Weight recorded in chart for this admission is identical to last admission on 8/3, so unsure of accuracy. However, according to this weight she may have lost 5.6 kg (6% body weight) over 1 month (since 7/17). Do not believe this is the case due to pt report of adequate intake. Pt had Ensure Enlive during most recent admission, but reports she does not want to drink that again.  Medications reviewed and include: cholecalciferol 1000  units daily, Miralax daily.  Labs reviewed: Glucose 133.  Nutrition-Focused physical exam completed. Findings are no fat depletion, no muscle depletion, and moderate edema.   Discussed plan with RN.  Diet Order:  Diet Heart Room service appropriate? Yes; Fluid consistency: Thin  Skin:  Wound (see comment) (Abrasion bilateral buttocks and legs, serous blister left anterior arm, MSAD buttocks, ecchymosis left arm)  Last BM:  PTA  Height:   Ht Readings from Last 1 Encounters:  10/02/15 5\' 5"  (1.651 m)    Weight:   Wt Readings from Last 1 Encounters:  10/02/15 205 lb 0.4 oz (93 kg)    Ideal Body Weight:  56.8 kg  BMI:  Body mass index is 34.12 kg/m.  Estimated Nutritional Needs:   Kcal:  2300-2700 (25-30 kcal/kg)  Protein:  115-140 grams  Fluid:  >/= 2.3 L/day  EDUCATION NEEDS:   No education needs identified at this time  Willey Blade, MS, RD, LDN

## 2015-10-03 ENCOUNTER — Other Ambulatory Visit: Payer: Self-pay

## 2015-10-03 ENCOUNTER — Ambulatory Visit: Payer: Self-pay

## 2015-10-03 LAB — CREATININE, SERUM
CREATININE: 1.62 mg/dL — AB (ref 0.44–1.00)
GFR calc Af Amer: 34 mL/min — ABNORMAL LOW (ref >60)
GFR, EST NON AFRICAN AMERICAN: 29 mL/min — AB (ref >60)

## 2015-10-03 LAB — VANCOMYCIN, TROUGH: Vancomycin Tr: 36 ug/mL (ref 15–20)

## 2015-10-03 NOTE — Patient Outreach (Signed)
[facility]) Care Management  10/03/2015  [name] Nov 30, 1935 [id number]   Telephone Screen  Referral Date: 09/29/15 Referral Source: MD office Referral Reason: " high risk for recurrent hospitalization, bacteremia, cancer"    Patient noted to be currently inpatient at [facility] and admitted on 10/14/2015.     Plan: RN CM will notify [facility] administrative assistant of case status. RN CM will notify [facility] hospital liaisons of patient admission.   Enzo Montgomery, RN,BSN,CCM [facility] Telephonic Care Management Coordinator Direct Phone: [phone] Toll Free: [phone] Fax: [phone]

## 2015-10-03 NOTE — Progress Notes (Signed)
Pharmacy Antibiotic Note  [name] is a 80 y.o. female admitted on 10/13/2015 with pneumonia.  Pharmacy has been consulted for Vancomycin dosing.  Plan: D/c current vancomycin dose Recheck level at 1000 8/19  Height: 5\' 5"  (165.1 cm) Weight: 205 lb 0.4 oz (93 kg) IBW/kg (Calculated) : 57  Temp (24hrs), Avg:98.3 F (36.8 C), Min:98.1 F (36.7 C), Max:98.5 F (36.9 C)   Recent Labs Lab 10/07/2015 1726 10/13/2015 1941 10/03/15 2130  WBC 24.4*  --   --   CREATININE 0.69  --  1.62*  LATICACIDVEN  --  1.52  --   VANCOTROUGH  --   --  36*    Estimated Creatinine Clearance (by C-G formula based on SCr of 1.62 mg/dL) Female: 32.3 mL/min Female: 39.4 mL/min    No Known Allergies  Antimicrobials this admission: 8/16 Cefepime >>  8/16 Vancomycin >>   Dose adjustments this admission: 8/18 VT @2130  = 36 on 1250mg  iv q12hr dosing.  Prior doses charted correctly.  SCr jumped 0.69 --> 1.62.  Microbiology results: 8/16 BCx: sent 8/16 Rapid strep screen: negative 8/16 Group A strep cxt: ordered  Thank you for allowing pharmacy to be a part of this patient's care.  [name] 10/03/2015 11:00 PM

## 2015-10-03 NOTE — Progress Notes (Signed)
PROGRESS NOTE    [name]  [id number] DOB: Jul 10, 1935 DOA: 09/23/2015 PCP:  Melinda Crutch, MD   Brief Narrative:  80 y.o. woman with a remote history of breast cancer, Non-Hodgkin's lymphoma (followed by Dr. Marin Olp, patient reports recent PET scan x 2 suggestive of recurrent disease, due to see Dr. Marin Olp again in October), CAD, HTN, HLD, grade I diastolic dysfunction, and chronic debility (she was wheelchair bound even prior to being admitted to SNF after her last discharge earlier this month. Presents with HCAP per protocol as well as possible cellulitis.   Assessment & Plan:   Principal Problem:   Leukocytosis/Cellulitis - We'll continue current antibiotic regimen - Continue supportive therapy  Active Problems:   Coronary atherosclerosis - Stable no chest pain reported    Edema - Stable   DVT prophylaxis: Lovenox Code Status: Full Family Communication: None at bedside Disposition Plan: Pending improvement in condition   Consultants:   None   Procedures: None   Antimicrobials: Vancomycin and cefepime   Subjective: Patient has no new complaints.   Objective: Vitals:   10/03/15 0032 10/03/15 0456 10/03/15 0800 10/03/15 1427  BP: (!) 80/43 (!) 93/50 (!) 97/50 (!) 98/57  Pulse: 100 97 100 99  Resp:  (!) 23 (!) 28 (!) 23  Temp:  98.5 F (36.9 C) 98.1 F (36.7 C) 98.3 F (36.8 C)  TempSrc:  Axillary Oral Oral  SpO2: 98% 95% 94% 94%  Weight:      Height:        Intake/Output Summary (Last 24 hours) at 10/03/15 1651 Last data filed at 10/03/15 1427  Gross per 24 hour  Intake              840 ml  Output              150 ml  Net              690 ml   Filed Weights   10/02/15 0600  Weight: 93 kg (205 lb 0.4 oz)    Examination:  General exam: Appears calm and comfortable  Respiratory system: Clear to auscultation. Respiratory effort normal. Cardiovascular system: S1 & S2 heard, RRR. No JVD, murmurs, rubs, gallops or clicks. No pedal  edema. Gastrointestinal system: Abdomen is nondistended, soft and nontender. No organomegaly or masses felt. Normal bowel sounds heard. Central nervous system: Alert and oriented. No focal neurological deficits. Extremities: Symmetric 5 x 5 power. Skin: No rashes, lesions or ulcers Psychiatry: Judgement and insight appear normal. Mood & affect appropriate.     Data Reviewed: I have personally reviewed following labs and imaging studies  CBC:  Recent Labs Lab 10/11/2015 1726  WBC 24.4*  NEUTROABS 19.9*  HGB 10.8*  HCT 33.5*  MCV 82.1  PLT [id number]   Basic Metabolic Panel:  Recent Labs Lab 10/08/2015 1726 09/29/2015 2249  NA 136  --   K 4.0  --   CL 106  --   CO2 24  --   GLUCOSE 133*  --   BUN 14  --   CREATININE 0.69  --   CALCIUM 8.3*  --   MG  --  1.8  PHOS  --  4.2   GFR: Estimated Creatinine Clearance (by C-G formula based on SCr of 0.8 mg/dL) Female: 65.3 mL/min Female: 79.8 mL/min Liver Function Tests:  Recent Labs Lab 10/11/2015 2249  AST 129*  ALT 135*  ALKPHOS 206*  BILITOT 0.5  PROT 4.7*  ALBUMIN 2.1*   No  results for input(s): LIPASE, AMYLASE in the last 168 hours. No results for input(s): AMMONIA in the last 168 hours. Coagulation Profile: No results for input(s): INR, PROTIME in the last 168 hours. Cardiac Enzymes:  Recent Labs Lab 09/17/2015 2249  CKTOTAL 22*   BNP (last 3 results) No results for input(s): PROBNP in the last 8760 hours. HbA1C: No results for input(s): HGBA1C in the last 72 hours. CBG: No results for input(s): GLUCAP in the last 168 hours. Lipid Profile: No results for input(s): CHOL, HDL, LDLCALC, TRIG, CHOLHDL, LDLDIRECT in the last 72 hours. Thyroid Function Tests: No results for input(s): TSH, T4TOTAL, FREET4, T3FREE, THYROIDAB in the last 72 hours. Anemia Panel: No results for input(s): VITAMINB12, FOLATE, FERRITIN, TIBC, IRON, RETICCTPCT in the last 72 hours. Sepsis Labs:  Recent Labs Lab 09/22/2015 1941   LATICACIDVEN 1.52    Recent Results (from the past 240 hour(s))  Rapid strep screen     Status: None   Collection Time: 10/11/2015  4:00 PM  Result Value Ref Range Status   Streptococcus, Group A Screen (Direct) NEGATIVE NEGATIVE Final    Comment: (NOTE) A Rapid Antigen test may result negative if the antigen level in the sample is below the detection level of this test. The FDA has not cleared this test as a stand-alone test therefore the rapid antigen negative result has reflexed to a Group A Strep culture.   Culture, group A strep     Status: None (Preliminary result)   Collection Time: 10/16/2015  4:00 PM  Result Value Ref Range Status   Specimen Description THROAT  Final   Special Requests NONE Reflexed from [id number]  Final   Culture   Final    CULTURE REINCUBATED FOR BETTER GROWTH Performed at [facility]    Report Status PENDING  Incomplete  Culture, blood (routine x 2)     Status: None (Preliminary result)   Collection Time: 10/12/2015  6:19 PM  Result Value Ref Range Status   Specimen Description BLOOD BLOOD LEFT ARM  Final   Special Requests BOTTLES DRAWN AEROBIC AND ANAEROBIC 10CC  Final   Culture   Final    NO GROWTH 2 DAYS Performed at [facility]    Report Status PENDING  Incomplete  Culture, blood (routine x 2)     Status: None (Preliminary result)   Collection Time: 09/21/2015  6:19 PM  Result Value Ref Range Status   Specimen Description BLOOD BLOOD LEFT ARM  Final   Special Requests BOTTLES DRAWN AEROBIC AND ANAEROBIC 10CC  Final   Culture   Final    NO GROWTH 2 DAYS Performed at [facility]    Report Status PENDING  Incomplete         Radiology Studies: Ct Chest Wo Contrast  Result Date: 10/02/2015 CLINICAL DATA:  80 y/o F; history of breast cancer, non-Hodgkin's lymphoma, and coronary artery disease presenting with abnormal chest radiograph. No cough or chest pain. EXAM: CT CHEST WITHOUT CONTRAST TECHNIQUE: Multidetector CT  imaging of the chest was performed following the standard protocol without IV contrast. COMPARISON:  Chest CT dated 07/10/2014.  PET-CT dated 09/08/2015. FINDINGS: Cardiovascular: Normal heart size. Small pericardial effusion. Extensive coronary artery calcifications predominantly in the LAD. Aortic valvular calcification. Aorta and main pulmonary artery are normal in size. Moderate calcific atherosclerosis of the thoracic aorta. Mediastinum/Nodes: Mediastinal lymphadenopathy in prevascular, AP window, right paratracheal, and subcarinal spaces the largest in the right peritracheal region measuring 15 mm short axis (  series 2, image 44) grossly stable in comparison the prior PET-CT. Lungs/Pleura: Small bilateral pleural effusions.There is diffuse septal thickening and ground-glass opacities of lung parenchyma. There is no consolidation. There may be a background of reticulation at the peripheries of the lung which may represent a component of mild fibrosis. 8 mm nodule in the left upper lobe increased in size from PET-CT and prior chest CT where it had a triangular appearance is probably an intrapulmonary lymph node (series 5, image 49) Upper Abdomen:  Status post cholecystectomy. Musculoskeletal: No chest wall mass or suspicious bone lesions identified. IMPRESSION: 1. Diffuse septal thickening and ground-glass opacity of the lung probably represents chronic interstitial pulmonary edema. Atypical pneumonia is considered less likely. No consolidation. 2. Possible background of reticulation of the lung peripheries may represent a component of fibrosis. 3. Small bilateral pleural effusions and small pericardial effusion. 4. Mediastinal lymphadenopathy grossly stable from prior PET-CT. Electronically Signed   By: Kristine Garbe M.D.   On: 10/02/2015 01:17   Dg Hand 2 View Right  Result Date: 09/30/2015 CLINICAL DATA:  Chronic right hand pain.  Initial encounter. EXAM: RIGHT HAND - 2 VIEW COMPARISON:  Right  thumb radiographs performed 02/25/2012 FINDINGS: There is no evidence of fracture or dislocation. Minimal degenerative change is noted at the first carpometacarpal joint. The carpal rows are intact, and demonstrate normal alignment. Mild soft tissue swelling is noted about the metacarpophalangeal joints and proximal fingers. IMPRESSION: No evidence of fracture or dislocation. Electronically Signed   By: Garald Balding M.D.   On: 09/24/2015 22:57   Dg Chest Port 1 View  Result Date: 10/13/2015 CLINICAL DATA:  Acute onset of shortness of breath, body aches and generalized weakness. Sore throat. Initial encounter. EXAM: PORTABLE CHEST 1 VIEW COMPARISON:  Chest radiograph performed 09/19/2015 FINDINGS: Vascular congestion is noted. Increased interstitial markings may reflect pulmonary edema, though pneumonia could have a similar appearance. No definite pleural effusion or pneumothorax is seen. The cardiomediastinal silhouette is mildly enlarged. No acute osseous abnormalities are seen. Mild degenerative change is noted at the left glenohumeral joint. IMPRESSION: Vascular congestion and mild cardiomegaly. Increased interstitial markings may reflect pulmonary edema, though pneumonia could have a similar appearance. Electronically Signed   By: Garald Balding M.D.   On: 09/17/2015 18:40   US Abdomen Limited Ruq  Result Date: 10/03/2015 CLINICAL DATA:  Abnormal liver function studies. Previous cholecystectomy. EXAM: US ABDOMEN LIMITED - RIGHT UPPER QUADRANT COMPARISON:  08/31/2015 FINDINGS: Gallbladder: Gallbladder is surgically absent. Common bile duct: Diameter: Distal common bile duct is not visualized due to bowel gas. Visualized proximal duct diameter measures 2.7 mm, normal Liver: Limited visualization due to body habitus. No focal lesions identified. Parenchymal echotexture appears normal for technique. IMPRESSION: Surgical absence of the gallbladder. No bile duct dilatation identified. No focal liver lesions  are appreciated. Electronically Signed   By: Lucienne Capers M.D.   On: 10/03/2015 01:44    Scheduled Meds: . amitriptyline  100 mg Oral QHS  . aspirin EC  81 mg Oral Q breakfast  . atorvastatin  80 mg Oral Q breakfast  . ceFEPime (MAXIPIME) IV  1 g Intravenous Q8H  . cholecalciferol  1,000 Units Oral q morning - 10a  . enoxaparin (LOVENOX) injection  40 mg Subcutaneous Q24H  . lisinopril  20 mg Oral QAC breakfast  . LORazepam  2 mg Oral BID  . polyethylene glycol  17 g Oral Daily  . vancomycin  1,250 mg Intravenous Q12H   Continuous Infusions:  LOS: 1 day   Time spent: > 35 minutes  Velvet Bathe, MD [facility] Pager [phone]  If 7PM-7AM, please contact night-coverage [email or web address] Password [facility] 10/03/2015, 4:51 PM

## 2015-10-03 NOTE — Progress Notes (Signed)
Pharmacy Antibiotic Note  [name] is a 80 y.o. female admitted on 10/13/2015 with suspected pneumonia.  Pharmacy has been consulted for Vancomycin dosing and antibiotic renal dose adjustment.  Plan:   Cefepime 1g IV q8h  Vancomycin 1250 mg IV q12h.  Will get vanc trough and Scr tonight  Follow up renal fxn, culture results, and clinical course.  Height: 5\' 5"  (165.1 cm) Weight: 205 lb 0.4 oz (93 kg) IBW/kg (Calculated) : 57  Last weight documented as 93 kg (09/18/15)  Temp (24hrs), Avg:98.2 F (36.8 C), Min:98 F (36.7 C), Max:98.5 F (36.9 C)   Recent Labs Lab 10/13/2015 1726 09/26/2015 1941  WBC 24.4*  --   CREATININE 0.69  --   LATICACIDVEN  --  1.52    Estimated Creatinine Clearance (by C-G formula based on SCr of 0.8 mg/dL) Female: 65.3 mL/min Female: 79.8 mL/min    No Known Allergies  Antimicrobials this admission: 8/16 Cefepime >>  8/16 Vancomycin >>   Dose adjustments this admission:  Microbiology results: 8/16 BCx: NGTD 8/16 Rapid strep screen: negative 8/16 Group A strep cxt: ordered  Thank you for allowing pharmacy to be a part of this patient's care.  Dolly Rias RPh 10/03/2015, 1:05 PM Pager [phone]

## 2015-10-03 NOTE — Progress Notes (Signed)
USD completed ~ 2200.

## 2015-10-03 NOTE — Clinical Social Work Note (Addendum)
Clinical Social Work Assessment  Patient Details  Name: [name] MRN: [id number] Date of Birth: June 04, 1935  Date of referral:  10/03/15               Reason for consult:  Discharge Planning                Permission sought to share information with:  Family Supports, Customer service manager, Case Optician, dispensing granted to share information::  Yes, Verbal Permission Granted  Name::      Presenter, broadcasting )  Agency::   (SNF's )  Relationship::   (Son)  Contact Information:   [phone])  Housing/Transportation Living arrangements for the past 2 months:  [name] of Information:  Patient Patient Interpreter Needed:  None Criminal Activity/Legal Involvement Pertinent to Current Situation/Hospitalization:  No - Comment as needed Significant Relationships:  Adult Children, Neighbor, Friend Lives with:  Self Do you feel safe going back to the place where you live?  Yes Need for family participation in patient care:  Yes (Comment)  Care giving concerns:  Admitted from SNF, [facility]) with plans to return home at d/c.    Social Worker assessment / plan:  MSW met with patient at bedside. Patient reported she has been at [name] for almost 2 weeks. Patient further stated she plans to return home at d/c and has declined options for SNF placement. Per facility, patient has NOT been participating with PT/OT since her stay and has strongly expressed her desire to return home. Patient stated that her neighbor who lives a few doors down plans to assist her once returning home. Patient could NOT confirm 24 support and will likely be at home alone. Patient continued to decline SNF placement throughout assessment. Patient also asked that MSW meet with her again at a later date. No further concerns reported at this time. Case discussed with RNCM. MSW remains available as needed.   MSW attempted to contact pt's son and left a message for a returned  phone call to confirm caregiver support at home.   Employment status:  Retired Nurse, adult PT Recommendations:  Not assessed at this time Information / Referral to community resources:  [name]  Patient/Family's Response to care:  Patient alert and oriented x4. Pt refusing SNF placement and plans to return home once d/c'ed.    Patient/Family's Understanding of and Emotional Response to Diagnosis, Current Treatment, and Prognosis:  Unsure of patient's understanding of medical care/interventions and importance of PT.   Emotional Assessment Appearance:  Appears stated age Attitude/Demeanor/Rapport:  Avoidant Affect (typically observed):  Calm Orientation:  Oriented to Self, Oriented to Place, Oriented to  Time, Oriented to Situation Alcohol / Substance use:  Not Applicable Psych involvement (Current and /or in the community):  No (Comment)  Discharge Needs  Concerns to be addressed:  Denies Needs/Concerns at this time, Patient refuses services Readmission within the last 30 days:  No Current discharge risk:  Dependent with Mobility, Lack of support system Barriers to Discharge:  Continued Medical Work up   [employer], MSW [phone] 10/03/2015 2:32 PM

## 2015-10-04 ENCOUNTER — Inpatient Hospital Stay (HOSPITAL_COMMUNITY): Payer: PPO

## 2015-10-04 DIAGNOSIS — J189 Pneumonia, unspecified organism: Principal | ICD-10-CM

## 2015-10-04 DIAGNOSIS — D72829 Elevated white blood cell count, unspecified: Secondary | ICD-10-CM

## 2015-10-04 DIAGNOSIS — J9601 Acute respiratory failure with hypoxia: Secondary | ICD-10-CM

## 2015-10-04 LAB — CBC
HCT: 27.8 % — ABNORMAL LOW (ref 36.0–46.0)
Hemoglobin: 8.7 g/dL — ABNORMAL LOW (ref 12.0–15.0)
MCH: 25.6 pg — AB (ref 26.0–34.0)
MCHC: 31.3 g/dL (ref 30.0–36.0)
MCV: 81.8 fL (ref 78.0–100.0)
PLATELETS: 334 10*3/uL (ref 150–400)
RBC: 3.4 MIL/uL — AB (ref 3.87–5.11)
RDW: 18.8 % — ABNORMAL HIGH (ref 11.5–15.5)
WBC: 25.3 10*3/uL — ABNORMAL HIGH (ref 4.0–10.5)

## 2015-10-04 LAB — BASIC METABOLIC PANEL
Anion gap: 7 (ref 5–15)
BUN: 36 mg/dL — AB (ref 6–20)
CO2: 22 mmol/L (ref 22–32)
Calcium: 8 mg/dL — ABNORMAL LOW (ref 8.9–10.3)
Chloride: 106 mmol/L (ref 101–111)
Creatinine, Ser: 1.61 mg/dL — ABNORMAL HIGH (ref 0.44–1.00)
GFR calc Af Amer: 34 mL/min — ABNORMAL LOW (ref >60)
GFR, EST NON AFRICAN AMERICAN: 30 mL/min — AB (ref >60)
Glucose, Bld: 120 mg/dL — ABNORMAL HIGH (ref 65–99)
Potassium: 4.4 mmol/L (ref 3.5–5.1)
SODIUM: 135 mmol/L (ref 135–145)

## 2015-10-04 LAB — LACTIC ACID, PLASMA
Lactic Acid, Venous: 1.1 mmol/L (ref 0.5–1.9)
Lactic Acid, Venous: 1.3 mmol/L (ref 0.5–1.9)

## 2015-10-04 LAB — INFLUENZA PANEL BY PCR (TYPE A & B)
H1N1FLUPCR: NOT DETECTED
INFLAPCR: NEGATIVE
INFLBPCR: NEGATIVE

## 2015-10-04 LAB — CULTURE, GROUP A STREP (THRC)

## 2015-10-04 LAB — PROCALCITONIN: Procalcitonin: 0.52 ng/mL

## 2015-10-04 LAB — LEGIONELLA PNEUMOPHILA SEROGP 1 UR AG: L. PNEUMOPHILA SEROGP 1 UR AG: NEGATIVE

## 2015-10-04 LAB — MRSA PCR SCREENING: MRSA BY PCR: NEGATIVE

## 2015-10-04 LAB — VANCOMYCIN, RANDOM: VANCOMYCIN RM: 27

## 2015-10-04 LAB — TROPONIN I: TROPONIN I: 0.05 ng/mL — AB (ref <0.03)

## 2015-10-04 MED ORDER — ACETAMINOPHEN 500 MG PO TABS
500.0000 mg | ORAL_TABLET | Freq: Once | ORAL | Status: AC
  Administered 2015-10-04: 500 mg via ORAL

## 2015-10-04 MED ORDER — DEXTROSE 5 % IV SOLN
1.0000 g | Freq: Two times a day (BID) | INTRAVENOUS | Status: DC
  Administered 2015-10-04 – 2015-10-06 (×4): 1 g via INTRAVENOUS
  Filled 2015-10-04 (×5): qty 1

## 2015-10-04 MED ORDER — SODIUM CHLORIDE 0.9 % IV BOLUS (SEPSIS)
250.0000 mL | Freq: Once | INTRAVENOUS | Status: AC
  Administered 2015-10-04: 250 mL via INTRAVENOUS

## 2015-10-04 MED ORDER — NOREPINEPHRINE BITARTRATE 1 MG/ML IV SOLN
0.0000 ug/min | INTRAVENOUS | Status: DC
  Administered 2015-10-04: 2 ug/min via INTRAVENOUS
  Administered 2015-10-05: 27 ug/min via INTRAVENOUS
  Administered 2015-10-05: 19 ug/min via INTRAVENOUS
  Administered 2015-10-05: 24 ug/min via INTRAVENOUS
  Filled 2015-10-04 (×4): qty 4

## 2015-10-04 MED ORDER — ACETAMINOPHEN 500 MG PO TABS
1000.0000 mg | ORAL_TABLET | Freq: Four times a day (QID) | ORAL | Status: DC | PRN
  Filled 2015-10-04: qty 2

## 2015-10-04 MED ORDER — IPRATROPIUM BROMIDE 0.02 % IN SOLN
0.5000 mg | Freq: Four times a day (QID) | RESPIRATORY_TRACT | Status: DC
  Administered 2015-10-04 – 2015-10-07 (×11): 0.5 mg via RESPIRATORY_TRACT
  Filled 2015-10-04 (×11): qty 2.5

## 2015-10-04 MED ORDER — SODIUM CHLORIDE 0.9 % IV SOLN
INTRAVENOUS | Status: DC
  Administered 2015-10-04 – 2015-10-08 (×2): via INTRAVENOUS

## 2015-10-04 MED ORDER — SODIUM CHLORIDE 0.9 % IV BOLUS (SEPSIS): 250.0000 mL | Freq: Once | INTRAVENOUS | Status: DC

## 2015-10-04 MED ORDER — BUDESONIDE 0.5 MG/2ML IN SUSP
0.5000 mg | Freq: Two times a day (BID) | RESPIRATORY_TRACT | Status: DC
  Administered 2015-10-04 – 2015-10-15 (×20): 0.5 mg via RESPIRATORY_TRACT
  Filled 2015-10-04 (×21): qty 2

## 2015-10-04 NOTE — Progress Notes (Signed)
Report called to [facility] and given to Maudie Mercury, RN regarding pt status and reason for transfer. Recent VS and void status reported.

## 2015-10-04 NOTE — Progress Notes (Signed)
eLink Physician-Brief Progress Note Patient Name: [name] DOB: [birth year] MRN: [id number]   Date of Service  10/04/2015  HPI/Events of Note  Hypotension BP is now 73/31 (40)  eICU Interventions  Small fluid bolus 250cc Check CVP Will need levaphed if no response in BP.        Kalina Morabito 10/04/2015, 8:25 PM

## 2015-10-04 NOTE — Progress Notes (Signed)
Placed pt on bipap 12/5 30% per Dr. Larkin Ina

## 2015-10-04 NOTE — Progress Notes (Signed)
Transferred to [facility] via bed accompanied by RN and NT. Update report given to [facility]

## 2015-10-04 NOTE — Consult Note (Signed)
PULMONARY / CRITICAL CARE MEDICINE   Name: [name] MRN: [id number] DOB: 09-04-1935    ADMISSION DATE:  10/03/2015 CONSULTATION DATE:  10/04/15  REFERRING MD:  Dr. Wendee Beavers  CHIEF COMPLAINT:  SOB. PCCM consult for resp distress.   HISTORY OF PRESENT ILLNESS:   [name] is a 80 y.o. woman with a remote history of breast cancer, Non-Hodgkin's lymphoma (followed by Dr. Marin Olp, patient reports recent PET scan x 2 suggestive of recurrent disease, due to see Dr. Marin Olp again in October), CAD, HTN, HLD, grade I diastolic dysfunction, and chronic debility (she was wheelchair bound even prior to being admitted to SNF after her last discharge earlier this month).    She presented to the ED with SOB/sore throat. Sepsis was considered. Placed on abx. She was recently admitted for sepsis from UTI. She received adequate volume resuscitation. CT scan of the abdomen was concerning for possible "mesenteric stranding". Cultures negative so far. Being treated as cellulitis per hospitalis note.   When she came in, she was hypotensive. She responded to IV fluids. On the floors, they have difficulty getting adequate blood pressure. They have been getting blood pressure through L lower extremity cuff. Blood pressure 80-90/40-50.  She is mentating well. Making adequate urine per RN.  This morning, she was noted to be more hypotensive than 1-2 days ago. She ended up getting  A fluid bolus of 250 mls. She was noted to be tachypneic after getting a fluid bolus.  PCCM consulted for hypotension/resp distress.   Patient states her diastolic blood pressure has chronically been low. She uses oxygen 2 L at bedtime and with exertion. No history of other malignancy. No history of blood clot. Non smoker. No h/o copd,asthma.      PAST MEDICAL HISTORY :  She  has a past medical history of Anxiety; Bilateral carotid artery stenosis; CAD (coronary artery disease); Chronic chest pain; Chronic headache; Depression; Diffuse  large cell non-Hodgkin's lymphoma [facility]) (oncologist-  dr Marin Olp-  per last office note being monitored for possible recurrence); Dyspnea; GERD (gastroesophageal reflux disease); Hiatal hernia; History of breast cancer (oncologist-  dr Marin Olp--  no recurrence); History of colon polyps; History of colon polyps; History of panic attacks; History of small bowel obstruction; Hyperlipidemia; Hypertension; Low back pain; OSA (obstructive sleep apnea); Osteoarthritis; PMB (postmenopausal bleeding); S/P drug eluting coronary stent placement; and Thickened endometrium.  PAST SURGICAL HISTORY: She  has a past surgical history that includes Tonsillectomy; Inner ear surgery; Submandibular gland excision (Left, 05/10/2013); Portacath placement (06/04/13); Mastectomy with axillary lymph node dissection (Right, 01-03-2001); Cholecystectomy (2005); Cataract extraction w/ intraocular lens  implant, bilateral (2011); Knee arthroscopy (Right); Coronary angioplasty with stent (07-09-2009  dr Darnell Level brodie); Cardiac catheterization (07-15-2010   dr Kathlyn [place]); transthoracic echocardiogram (10-04-2006); Cardiovascular stress test (12-24-2009); and Colonoscopy with esophagogastroduodenoscopy (egd) (11-12-2014).  No Known Allergies  No current facility-administered medications on file prior to encounter.    Current Outpatient Prescriptions on File Prior to Encounter  Medication Sig  . acetaminophen (TYLENOL) 500 MG tablet Take 1 tablet (500 mg total) by mouth every 6 (six) hours as needed (for pain.).  Marland Kitchen amitriptyline (ELAVIL) 100 MG tablet Take 100 mg by mouth at bedtime.  Marland Kitchen aspirin 81 MG EC tablet Take 81 mg by mouth daily with breakfast.   . atorvastatin (LIPITOR) 80 MG tablet Take 1 tablet (80 mg total) by mouth daily. (Patient taking differently: Take 80 mg by mouth daily with breakfast. )  . cholecalciferol (VITAMIN D) 1000  UNITS tablet Take 1,000 Units by mouth every morning.   . docusate sodium (COLACE) 100 MG  capsule Take 1 capsule (100 mg total) by mouth 2 (two) times daily as needed for mild constipation.  Marland Kitchen LORazepam (ATIVAN) 0.5 MG tablet Take 1 tablet (0.5 mg total) by mouth 2 (two) times daily as needed for anxiety or sleep.  . nitroGLYCERIN (NITROSTAT) 0.4 MG SL tablet Place 1 tablet (0.4 mg total) under the tongue every 5 (five) minutes as needed for chest pain.  . Omega-3 Fatty Acids (FISH OIL) 1000 MG CAPS Take 1,000 mg by mouth every morning.   . polyethylene glycol powder (GLYCOLAX/MIRALAX) powder Take 17 g by mouth 2 (two) times daily as needed. (Patient taking differently: Take 17 g by mouth daily. )    FAMILY HISTORY:  Her indicated that her mother is deceased. She indicated that her father is deceased. She indicated that her brother is deceased.    SOCIAL HISTORY: She  reports that she has never smoked. She has never used smokeless tobacco. She reports that she does not drink alcohol or use drugs.  REVIEW OF SYSTEMS:   As mentioned. She has chronic dyspnea. On and off cough. She does not feel more dyspneic than usual. Denies fevers, chills, chest pain, abdominal pain. She has chronic swelling of her lower extremities.  SUBJECTIVE:  As above  VITAL SIGNS: BP (!) 89/39 (BP Location: Left Leg)   Pulse 94   Temp 98.7 F (37.1 C) (Oral)   Resp 16   Ht 5\' 5"  (1.651 m)   Wt 93 kg (205 lb 0.4 oz)   SpO2 96%   BMI 34.12 kg/m   HEMODYNAMICS:    VENTILATOR SETTINGS:    INTAKE / OUTPUT: I/O last 3 completed shifts: In: 1000 [P.O.:240; I.V.:10; IV Piggyback:750] Out: 1050 [Urine:1050]  PHYSICAL EXAMINATION: General:  Awake, oriented 3, speaks in full sentences without being too short of breath.  Mild tachypnea.  On Ventimask. Orbitally obese female. Neuro:  Cranial nerves grossly intact. No lateralizing sign seen. HEENT:  (-) NVD. Airway class III. Pupils reactive bilaterally. No oral thrush. Cardiovascular: good S1 and S2.  No S3 or rub or gallop. Lungs:  Fair air  entry. Mild tachypnea. Crackles mid to bases, right more than left. No wheezing or rhonchi. No accessory muscle use. Abdomen:  Possible bowel sounds.  Soft. Obese. Nontender.No masses palpated. Musculoskeletal:  no gross abnormalities. Chronic grade 2 edema. Skin:  Warm and dry. No rash.  LABS:  BMET  Recent Labs Lab 10/14/2015 1726 10/03/15 2130 10/04/15 0440  NA 136  --  135  K 4.0  --  4.4  CL 106  --  106  CO2 24  --  22  BUN 14  --  36*  CREATININE 0.69 1.62* 1.61*  GLUCOSE 133*  --  120*    Electrolytes  Recent Labs Lab 09/26/2015 1726 09/23/2015 2249 10/04/15 0440  CALCIUM 8.3*  --  8.0*  MG  --  1.8  --   PHOS  --  4.2  --     CBC  Recent Labs Lab 10/16/2015 1726 10/04/15 0440  WBC 24.4* 25.3*  HGB 10.8* 8.7*  HCT 33.5* 27.8*  PLT 379 334    Coag's No results for input(s): APTT, INR in the last 168 hours.  Sepsis Markers  Recent Labs Lab 09/20/2015 1941  LATICACIDVEN 1.52    ABG No results for input(s): PHART, PCO2ART, PO2ART in the last 168 hours.  Liver Enzymes  Recent Labs Lab 09/22/2015 2249  AST 129*  ALT 135*  ALKPHOS 206*  BILITOT 0.5  ALBUMIN 2.1*    Cardiac Enzymes No results for input(s): TROPONINI, PROBNP in the last 168 hours.  Glucose No results for input(s): GLUCAP in the last 168 hours.  Imaging Dg Chest Port 1 View  Result Date: 10/04/2015 CLINICAL DATA:  Shortness of breath.  Chest pain. EXAM: PORTABLE CHEST 1 VIEW COMPARISON:  Chest CT 10/02/2015 and chest radiograph 09/30/2015 FINDINGS: There is a left approach PICC line with tip at the confluence of the left brachiocephalic vein and superior vena cava. Cardiomediastinal silhouette is unchanged. There is shallow lung inflation with diffuse reticular opacities, right greater than left. There are bilateral small pleural effusions, larger on the left. No visible pneumothorax. IMPRESSION: 1. Worsening of diffuse reticular opacities, right worse than left. Findings may  indicate worsening pulmonary edema versus an atypical infectious process. There is likely a superimposed on pulmonary fibrosis. 2. Left approach PICC line terminating at the confluence of the brachiocephalic vein and SVC. 3. Small bilateral pleural effusions, slightly larger on left. Electronically Signed   By: Ulyses Jarred M.D.   On: 10/04/2015 06:57     STUDIES:  Chest ct scan (8/17) Bilateral groundglass like infiltrates, small pleural effusions bilateral.  CULTURES: Blood 8/16 > MRSA 8/19 >   ANTIBIOTICS: Cefepime 8/16 > Vanc 8/17 >   SIGNIFICANT EVENTS: 8/16 admit for sepsis syndrome, low BP, mild lactic acidosis. Hypotension persisted with resp distress. Transfer to SDU. PCCM consult.   LINES/TUBES: PICC  DISCUSSION: 80 year old female, with remote history of breast cancer, non-Hodgkin's lymphoma, admitted for dyspnea, not feeling well, sore throat. Patient being treated as sepsis probably related to healthcare associated pneumonia. Blood pressure has been soft since admission. There is concern about further hypotension  And respiratory distress in the floors on 8/19 so she was transferred to stepdown unit.  ASSESSMENT / PLAN:  PULMONARY A: Acute on chronic hypoxemic respiratory failure secondary to the following: 1. My main consideration is pulmonary edema. 2. Concern for healthcare associated pneumonia.Possible atypical pneumonia if you look at  The chest CT scan. 3. Diastolic dysfunction exacerbation. 4. Untreated obstructive sleep apnea. 5. Obesity hypoventilation syndrome. P:   Please BiPAP now, 12/5, keep O2 sats more than 88%. ABG now.  Pulmicort neb bid, atrovent q6 I will hold off on diuresis as her creatinine already doubled the last 24 hours. Continue antibiotics, broad-spectrum. Check cardiac enzymes.  CARDIOVASCULAR A:  Diastolic dysfunction. CAD Possible demand ischemia. Pulmonary edema P:  Trial of BiPAP. Check troponins.  RENAL A:   Acute  kidney injury. I'm not sure if she received diuresis during this admission. P:   Observe for now. Needs accurate intake and output. Needs Foley catheter to better urine output.  GASTROINTESTINAL A:   GERD P:   PPI Diet as tolerated off Bipap.   HEMATOLOGIC A:   Hodgkin's lymphoma, currently in remission.  Although recent imaging with lymphadenopathy which have been stable. P:  Follws with Dr. Martha Clan.   INFECTIOUS A:   Possible healthcare associated pneumonia. P:   Continue cefepime and vancomycin. Check lactic acid now. Check pro calcitonin. Follow cultures.  ENDOCRINE A:   (-) issues P:   CBG q 4 if NPO. Sliding scale.   NEUROLOGIC A:   Anxiety issues. depression P:   RASS goal: 0 Observe On elavil, ativan prn.   FAMILY  - Updates: no family around. I extensively discussed the plan of care with  the patient.  - Inter-disciplinary family meet or Palliative Care meeting due by:  8/26  Pt to stay in SDU for now. TH as primary.  PCCM will consult.   I spent 30 minutes of critical care time with this patient today.   Monica Becton, MD [facility] Pager: [phone] After 3 pm or if no response, call [phone]  10/04/2015, 4:36 PM

## 2015-10-04 NOTE — Progress Notes (Signed)
Pharmacy Antibiotic Note  Locklynn Huval Prosperi is a 80 y.o. female admitted on 09/27/2015 with suspected pneumonia.  Pharmacy has been consulted for Vancomycin dosing and antibiotic renal dose adjustment.  Today, 10/04/2015: Vancomycin level remains elevated No fevers but WBC remain elevated; SCr up, stable overnight  Plan:  Reduce Cefepime 1g IV q12 hr for CrCl  Continue to hold vancomycin for elevated levels.  Based on prior two levels and stable SCr overnight, will take at least another 25 hr to reach 20 mcg/ml. Check SCr tomorrow AM and repeat level as appropriate. Alternatively, as today is D4 abx, BCx remain negative, and MRSA PCR negative as of this July, consider if vancomycin can be discontinued.  Follow up renal fxn, culture results, and clinical course.  Height: 5\' 5"  (165.1 cm) Weight: 205 lb 0.4 oz (93 kg) IBW/kg (Calculated) : 57  Last weight documented as 93 kg (09/18/15)  Temp (24hrs), Avg:98.2 F (36.8 C), Min:98.1 F (36.7 C), Max:98.3 F (36.8 C)   Recent Labs Lab 10/08/2015 1726 09/24/2015 1941 10/03/15 2130 10/04/15 0440 10/04/15 1000  WBC 24.4*  --   --  25.3*  --   CREATININE 0.69  --  1.62* 1.61*  --   LATICACIDVEN  --  1.52  --   --   --   VANCOTROUGH  --   --  36*  --   --   VANCORANDOM  --   --   --   --  27    Estimated Creatinine Clearance (by C-G formula based on SCr of 1.61 mg/dL) Female: 32.5 mL/min Female: 39.6 mL/min    No Known Allergies  Antimicrobials this admission: 8/16 Cefepime >>  8/16 Vancomycin >>   Dose adjustments this admission: 8/18 VT= 36 on 1250mg  q12hr prior doses charted correctly. 8/19 VR @1000  = 27  Microbiology results: 8/16 BCx: ngtd 8/16 Rapid strep screen: negative 8/16 Group A strep cxt: reincubated  Thank you for allowing pharmacy to be a part of this patient's care.  Reuel Boom, PharmD, BCPS Pager: [phone] 10/04/2015, 11:46 AM

## 2015-10-04 NOTE — Progress Notes (Signed)
Informed K. Sofia of Troponin level 0.05 via text page.  Iantha Fallen RN 11:35 PM 10/04/2015

## 2015-10-04 NOTE — Progress Notes (Signed)
Pt resting quietly with no complaints. IV NS bolus started as ordered. BP 110/42.

## 2015-10-04 NOTE — Progress Notes (Signed)
Placed Bipap in room at patients beside. Pt is breathing with a respiratory rate of  24 with an o2 saturation of 94%. She says she has no difficulty breathing at this time. She is asking for ice cream. Will continue to monitor patients breathing.

## 2015-10-04 NOTE — Progress Notes (Signed)
During am report Dr. Wendee Beavers notified via phone of pt status. Pt running low BP earlier this am of 84/31 w/HR 98. No pain noted. No distress. Generalized edema. Night PA ordered CXR and results reported to Dr. Wendee Beavers. See new orders entered by MD into EPIC.

## 2015-10-04 NOTE — Progress Notes (Signed)
Dr. Wendee Beavers notified via phone pt's BP remains low 88/39 with HR in 90's. Pt not voided since early am- bladder scan resulted at most 197 ml urine in bladder. Denies chest pain. Complaint of pain continuing in rt leg. Both LE's elevated on pillows. HOB elevated. Slight tachypnea with RR 26-28. No acute distress noted. Snoring respirations while sleeping. MD ordered to give extra dose of 500 mg Tylenol for leg pain and Doppler study ordered. Dr. Wendee Beavers also ordered to move pt to ICU step down. See notes and new orders in EPIC.

## 2015-10-04 NOTE — Progress Notes (Addendum)
PROGRESS NOTE    [name]  [id number] DOB: 1935-06-17 DOA: 09/22/2015 PCP:  Melinda Crutch, MD   Brief Narrative:  80 y.o. woman with a remote history of breast cancer, Non-Hodgkin's lymphoma (followed by Dr. Marin Olp, patient reports recent PET scan x 2 suggestive of recurrent disease, due to see Dr. Marin Olp again in October), CAD, HTN, HLD, grade I diastolic dysfunction, and chronic debility she was wheelchair bound even prior to being admitted to SNF after her last discharge earlier this month. Addendum: Presents suspected cellulitis.  Assessment & Plan:   Principal Problem:   Leukocytosis/Cellulitis - We'll continue current antibiotic regimen - Continue supportive therapy  Hypotension - Patient is alert and mentating and producing urine. Will consult critical care team. I suspect that these are inaccurate reads as patient has been getting her blood pressures via blood pressure cuffs readings from her legs.   Active Problems:   Coronary atherosclerosis - Stable no chest pain reported    Edema - Stable   DVT prophylaxis: Lovenox Code Status: Full Family Communication: None at bedside Disposition Plan: Pending improvement in condition   Consultants:   None   Procedures: None   Antimicrobials: Vancomycin and cefepime   Subjective: Patient has no new complaints.   Objective: Vitals:   10/04/15 0532 10/04/15 0732 10/04/15 0956 10/04/15 1128  BP: (!) 84/31 (!) 106/42 (!) 88/39 (!) 88/42  Pulse: 98 92 95 92  Resp:      Temp: 98.1 F (36.7 C)  98.1 F (36.7 C)   TempSrc: Axillary  Oral   SpO2: 93%  99%   Weight:      Height:        Intake/Output Summary (Last 24 hours) at 10/04/15 1323 Last data filed at 10/04/15 0700  Gross per 24 hour  Intake              160 ml  Output              900 ml  Net             -740 ml   Filed Weights   10/02/15 0600  Weight: 93 kg (205 lb 0.4 oz)    Examination:  General exam: Appears calm and comfortable    Respiratory system: Clear to auscultation. Respiratory effort normal. Cardiovascular system: S1 & S2 heard, RRR. No JVD, murmurs, rubs, gallops or clicks. No pedal edema. Gastrointestinal system: Abdomen is nondistended, soft and nontender. No organomegaly or masses felt. Normal bowel sounds heard. Central nervous system: Alert and oriented. No focal neurological deficits. Extremities: Symmetric 5 x 5 power. Skin: No rashes, lesions or ulcers Psychiatry: Judgement and insight appear normal. Mood & affect appropriate.   Data Reviewed: I have personally reviewed following labs and imaging studies  CBC:  Recent Labs Lab 09/30/2015 1726 10/04/15 0440  WBC 24.4* 25.3*  NEUTROABS 19.9*  --   HGB 10.8* 8.7*  HCT 33.5* 27.8*  MCV 82.1 81.8  PLT 379 [id number]   Basic Metabolic Panel:  Recent Labs Lab 10/02/2015 1726 09/16/2015 2249 10/03/15 2130 10/04/15 0440  NA 136  --   --  135  K 4.0  --   --  4.4  CL 106  --   --  106  CO2 24  --   --  22  GLUCOSE 133*  --   --  120*  BUN 14  --   --  36*  CREATININE 0.69  --  1.62* 1.61*  CALCIUM 8.3*  --   --  8.0*  MG  --  1.8  --   --   PHOS  --  4.2  --   --    GFR: Estimated Creatinine Clearance (by C-G formula based on SCr of 1.61 mg/dL) Female: 32.5 mL/min Female: 39.6 mL/min Liver Function Tests:  Recent Labs Lab 09/29/2015 2249  AST 129*  ALT 135*  ALKPHOS 206*  BILITOT 0.5  PROT 4.7*  ALBUMIN 2.1*   No results for input(s): LIPASE, AMYLASE in the last 168 hours. No results for input(s): AMMONIA in the last 168 hours. Coagulation Profile: No results for input(s): INR, PROTIME in the last 168 hours. Cardiac Enzymes:  Recent Labs Lab 09/27/2015 2249  CKTOTAL 22*   BNP (last 3 results) No results for input(s): PROBNP in the last 8760 hours. HbA1C: No results for input(s): HGBA1C in the last 72 hours. CBG: No results for input(s): GLUCAP in the last 168 hours. Lipid Profile: No results for input(s): CHOL, HDL, LDLCALC,  TRIG, CHOLHDL, LDLDIRECT in the last 72 hours. Thyroid Function Tests: No results for input(s): TSH, T4TOTAL, FREET4, T3FREE, THYROIDAB in the last 72 hours. Anemia Panel: No results for input(s): VITAMINB12, FOLATE, FERRITIN, TIBC, IRON, RETICCTPCT in the last 72 hours. Sepsis Labs:  Recent Labs Lab 10/10/2015 1941  LATICACIDVEN 1.52    Recent Results (from the past 240 hour(s))  Rapid strep screen     Status: None   Collection Time: 10/11/2015  4:00 PM  Result Value Ref Range Status   Streptococcus, Group A Screen (Direct) NEGATIVE NEGATIVE Final    Comment: (NOTE) A Rapid Antigen test may result negative if the antigen level in the sample is below the detection level of this test. The FDA has not cleared this test as a stand-alone test therefore the rapid antigen negative result has reflexed to a Group A Strep culture.   Culture, group A strep     Status: None (Preliminary result)   Collection Time: 10/11/2015  4:00 PM  Result Value Ref Range Status   Specimen Description THROAT  Final   Special Requests NONE Reflexed from [id number]  Final   Culture   Final    CULTURE REINCUBATED FOR BETTER GROWTH Performed at [facility]    Report Status PENDING  Incomplete  Culture, blood (routine x 2)     Status: None (Preliminary result)   Collection Time: 09/24/2015  6:19 PM  Result Value Ref Range Status   Specimen Description BLOOD BLOOD LEFT ARM  Final   Special Requests BOTTLES DRAWN AEROBIC AND ANAEROBIC 10CC  Final   Culture   Final    NO GROWTH 2 DAYS Performed at [facility]    Report Status PENDING  Incomplete  Culture, blood (routine x 2)     Status: None (Preliminary result)   Collection Time: 09/22/2015  6:19 PM  Result Value Ref Range Status   Specimen Description BLOOD BLOOD LEFT ARM  Final   Special Requests BOTTLES DRAWN AEROBIC AND ANAEROBIC 10CC  Final   Culture   Final    NO GROWTH 2 DAYS Performed at [facility]    Report Status PENDING   Incomplete         Radiology Studies: Dg Chest Port 1 View  Result Date: 10/04/2015 CLINICAL DATA:  Shortness of breath.  Chest pain. EXAM: PORTABLE CHEST 1 VIEW COMPARISON:  Chest CT 10/02/2015 and chest radiograph 09/29/2015 FINDINGS: There is a left approach PICC line with tip at the confluence of the left  brachiocephalic vein and superior vena cava. Cardiomediastinal silhouette is unchanged. There is shallow lung inflation with diffuse reticular opacities, right greater than left. There are bilateral small pleural effusions, larger on the left. No visible pneumothorax. IMPRESSION: 1. Worsening of diffuse reticular opacities, right worse than left. Findings may indicate worsening pulmonary edema versus an atypical infectious process. There is likely a superimposed on pulmonary fibrosis. 2. Left approach PICC line terminating at the confluence of the brachiocephalic vein and SVC. 3. Small bilateral pleural effusions, slightly larger on left. Electronically Signed   By: Ulyses Jarred M.D.   On: 10/04/2015 06:57   US Abdomen Limited Ruq  Result Date: 10/03/2015 CLINICAL DATA:  Abnormal liver function studies. Previous cholecystectomy. EXAM: US ABDOMEN LIMITED - RIGHT UPPER QUADRANT COMPARISON:  08/31/2015 FINDINGS: Gallbladder: Gallbladder is surgically absent. Common bile duct: Diameter: Distal common bile duct is not visualized due to bowel gas. Visualized proximal duct diameter measures 2.7 mm, normal Liver: Limited visualization due to body habitus. No focal lesions identified. Parenchymal echotexture appears normal for technique. IMPRESSION: Surgical absence of the gallbladder. No bile duct dilatation identified. No focal liver lesions are appreciated. Electronically Signed   By: Lucienne Capers M.D.   On: 10/03/2015 01:44    Scheduled Meds: . amitriptyline  100 mg Oral QHS  . aspirin EC  81 mg Oral Q breakfast  . atorvastatin  80 mg Oral Q breakfast  . ceFEPime (MAXIPIME) IV  1 g  Intravenous Q12H  . cholecalciferol  1,000 Units Oral q morning - 10a  . enoxaparin (LOVENOX) injection  40 mg Subcutaneous Q24H  . LORazepam  2 mg Oral BID  . polyethylene glycol  17 g Oral Daily   Continuous Infusions:    LOS: 2 days   Time spent: > 35 minutes  Velvet Bathe, MD [facility] Pager [phone]  If 7PM-7AM, please contact night-coverage [email or web address] Password [email or web address] 10/04/2015, 1:23 PM

## 2015-10-05 ENCOUNTER — Encounter (HOSPITAL_COMMUNITY): Payer: Self-pay

## 2015-10-05 DIAGNOSIS — N179 Acute kidney failure, unspecified: Secondary | ICD-10-CM

## 2015-10-05 DIAGNOSIS — L8990 Pressure ulcer of unspecified site, unspecified stage: Secondary | ICD-10-CM | POA: Insufficient documentation

## 2015-10-05 LAB — BLOOD GAS, ARTERIAL
Acid-base deficit: 4.7 mmol/L — ABNORMAL HIGH (ref 0.0–2.0)
BICARBONATE: 19.7 meq/L — AB (ref 20.0–24.0)
DELIVERY SYSTEMS: POSITIVE
Drawn by: 449561
Expiratory PAP: 5
FIO2: 30
Inspiratory PAP: 14
O2 Saturation: 96.2 %
PATIENT TEMPERATURE: 97.3
TCO2: 18.4 mmol/L (ref 0–100)
pCO2 arterial: 34.6 mmHg — ABNORMAL LOW (ref 35.0–45.0)
pH, Arterial: 7.369 (ref 7.350–7.450)
pO2, Arterial: 90.3 mmHg (ref 80.0–100.0)

## 2015-10-05 LAB — CREATININE, SERUM
Creatinine, Ser: 1.34 mg/dL — ABNORMAL HIGH (ref 0.44–1.00)
GFR, EST AFRICAN AMERICAN: 43 mL/min — AB (ref >60)
GFR, EST NON AFRICAN AMERICAN: 37 mL/min — AB (ref >60)

## 2015-10-05 LAB — PROCALCITONIN: Procalcitonin: 0.5 ng/mL

## 2015-10-05 LAB — TROPONIN I
TROPONIN I: 0.03 ng/mL — AB (ref <0.03)
TROPONIN I: 0.04 ng/mL — AB (ref <0.03)

## 2015-10-05 MED ORDER — NOREPINEPHRINE BITARTRATE 1 MG/ML IV SOLN
0.0000 ug/min | INTRAVENOUS | Status: DC
  Administered 2015-10-05: 27 ug/min via INTRAVENOUS
  Administered 2015-10-05: 23 ug/min via INTRAVENOUS
  Administered 2015-10-06: 22 ug/min via INTRAVENOUS
  Filled 2015-10-05 (×3): qty 16

## 2015-10-05 MED ORDER — VANCOMYCIN HCL IN DEXTROSE 750-5 MG/150ML-% IV SOLN
750.0000 mg | Freq: Two times a day (BID) | INTRAVENOUS | Status: DC
  Administered 2015-10-05 – 2015-10-06 (×3): 750 mg via INTRAVENOUS
  Filled 2015-10-05 (×3): qty 150

## 2015-10-05 MED ORDER — UNJURY CHICKEN SOUP POWDER
2.0000 [oz_av] | Freq: Four times a day (QID) | ORAL | Status: DC
  Filled 2015-10-05 (×14): qty 27

## 2015-10-05 NOTE — Progress Notes (Signed)
Re: LE venous duplex order. Spoke with Dr. Wendee Beavers about negative study on 10/02/15. Ok to cancel current order as it is no longer needed.   Landry Mellow, RDMS, RVT Vascular lab

## 2015-10-05 NOTE — Progress Notes (Signed)
Levophed drip changed to quad strength. Discussed the need to re-program the pump with Kerrie Pleasure.  Romeo Rabon, PharmD, pager [phone]. 10/05/2015,8:36 AM.

## 2015-10-05 NOTE — Progress Notes (Signed)
Pharmacy Antibiotic Note  [name] is a 80 y.o. female admitted on 10/13/2015 with suspected pneumonia.  Pharmacy has been consulted for Vancomycin dosing and antibiotic renal dose adjustment.  Today, 10/05/2015: SCr improved No fevers but WBC remain elevated PCT borderline and stable, still requiring pressors for BP support  Plan:  Continue Cefepime 1g IV q12 hr for CrCl  Resume vancomycin at 750 mg IV q12 hr; this dose is slightly high for previous clearance, but SCr and UOP improving (VT target 15-20 mcg/ml)  Follow up renal fxn, culture results, and clinical course.  Height: 5\' 5"  (165.1 cm) Weight: 205 lb 0.4 oz (93 kg) IBW/kg (Calculated) : 57  Last weight documented as 93 kg (09/18/15)  Temp (24hrs), Avg:98.1 F (36.7 C), Min:97.3 F (36.3 C), Max:98.7 F (37.1 C)   Recent Labs Lab 09/28/2015 1726 10/09/2015 1941 10/03/15 2130 10/04/15 0440 10/04/15 1000 10/04/15 1812 10/04/15 2203 10/05/15 0406  WBC 24.4*  --   --  25.3*  --   --   --   --   CREATININE 0.69  --  1.62* 1.61*  --   --   --  1.34*  LATICACIDVEN  --  1.52  --   --   --  1.1 1.3  --   VANCOTROUGH  --   --  36*  --   --   --   --   --   VANCORANDOM  --   --   --   --  27  --   --   --     Estimated Creatinine Clearance (by C-G formula based on SCr of 1.34 mg/dL) Female: 39 mL/min Female: 47.6 mL/min    No Known Allergies  Antimicrobials this admission: 8/16 Cefepime >>  8/16 Vancomycin >>   Dose adjustments this admission: 8/18 VT= 36 on 1250mg  q12hr prior doses charted correctly. 8/19 VR @1000  = 27 >> hold 8/20 Resume vanc 750 q12  Microbiology results: 8/16 BCx: ngtd 8/16 Rapid strep screen: negative 8/16 Group A strep cxt: reincubated 8/19 MRSA PCR: neg  Thank you for allowing pharmacy to be a part of this patient's care.  Reuel Boom, PharmD, BCPS Pager: [phone] 10/05/2015, 10:02 AM

## 2015-10-05 NOTE — Progress Notes (Signed)
PULMONARY / CRITICAL CARE MEDICINE   Name: [name] MRN: [id number] DOB: 05-Apr-1935    ADMISSION DATE:  10/14/2015 CONSULTATION DATE:  10/04/15  REFERRING MD:  [name]  CHIEF COMPLAINT:  SOB. PCCM consult for resp distress.   HISTORY OF PRESENT ILLNESS:   [name] is a 80 y.o. woman with a remote history of breast cancer, Non-Hodgkin's lymphoma (followed by [name], patient reports recent PET scan x 2 suggestive of recurrent disease, due to see [name] in October), CAD, HTN, HLD, grade I diastolic dysfunction, and chronic debility (she was wheelchair bound even prior to being admitted to SNF after her last discharge earlier this month).    She presented to the ED with SOB/sore throat. Sepsis was considered. Placed on abx. She was recently admitted for sepsis from UTI. She received adequate volume resuscitation. CT scan of the abdomen was concerning for possible "mesenteric stranding". Cultures negative so far. Being treated as cellulitis per hospitalist  note.   When she came in, she was hypotensive. She responded to IV fluids. On the floors, they have difficulty getting adequate blood pressure. They have been getting blood pressure through L lower extremity cuff. Blood pressure 80-90/40-50.  She is mentating well. Making adequate urine per RN.  This morning, she was noted to be more hypotensive than 1-2 days ago. She ended up getting  A fluid bolus of 250 mls. She was noted to be tachypneic after getting a fluid bolus.  PCCM consulted for hypotension/resp distress.   Patient states her diastolic blood pressure has chronically been low. She uses oxygen 2 L at bedtime and with exertion. No history of other malignancy. No history of blood clot. Non smoker. No h/o copd,asthma.    SUBJECTIVE:  No subj complaints per se. Started on levophed overnight.  Tolerating Bipap.    VITAL SIGNS: BP (!) 122/49   Pulse 93   Temp 98.5 F (36.9 C) (Axillary)   Resp (!) 23    Ht 5\' 5"  (1.651 m)   Wt 93 kg (205 lb 0.4 oz)   SpO2 100%   BMI 34.12 kg/m   HEMODYNAMICS:    VENTILATOR SETTINGS: FiO2 (%):  [30 %] 30 %  INTAKE / OUTPUT: I/O last 3 completed shifts: In: 971.8 [P.O.:120; I.V.:601.8; IV Piggyback:250] Out: [id number]  PHYSICAL EXAMINATION: General:  Awake, oriented 3, speaks in full sentences without being too short of breath.  Mild tachypnea.  On Bipap. Morbidly obese female. Neuro:  Cranial nerves grossly intact. No lateralizing sign seen. HEENT:  (-) NVD. Airway class III. Pupils reactive bilaterally. No oral thrush. Cardiovascular: good S1 and S2.  No S3 or rub or gallop. Lungs:  Fair air entry. Mild tachypnea. Less crackles today. (-) wheezing/rhonchi. Abdomen:  Possible bowel sounds.  Soft. Obese. Nontender.No masses palpated. Musculoskeletal:  no gross abnormalities. Chronic grade 2 edema. Skin:  Warm and dry. No rash.  LABS:  BMET  Recent Labs Lab 09/26/2015 1726 10/03/15 2130 10/04/15 0440 10/05/15 0406  NA 136  --  135  --   K 4.0  --  4.4  --   CL 106  --  106  --   CO2 24  --  22  --   BUN 14  --  36*  --   CREATININE 0.69 1.62* 1.61* 1.34*  GLUCOSE 133*  --  120*  --     Electrolytes  Recent Labs Lab 09/30/2015 1726 09/30/2015 2249 10/04/15 0440  CALCIUM 8.3*  --  8.0*  MG  --  1.8  --   PHOS  --  4.2  --     CBC  Recent Labs Lab 09/26/2015 1726 10/04/15 0440  WBC 24.4* 25.3*  HGB 10.8* 8.7*  HCT 33.5* 27.8*  PLT 379 334    Coag's No results for input(s): APTT, INR in the last 168 hours.  Sepsis Markers  Recent Labs Lab 09/28/2015 1941 10/04/15 1812 10/04/15 2203 10/05/15 0406  LATICACIDVEN 1.52 1.1 1.3  --   PROCALCITON  --  0.52  --  0.50    ABG  Recent Labs Lab 10/05/15 0353  PHART 7.369  PCO2ART 34.6*  PO2ART 90.3    Liver Enzymes  Recent Labs Lab 10/14/2015 2249  AST 129*  ALT 135*  ALKPHOS 206*  BILITOT 0.5  ALBUMIN 2.1*    Cardiac Enzymes  Recent Labs Lab  10/04/15 2203 10/05/15 0406  TROPONINI 0.05* 0.03*    Glucose No results for input(s): GLUCAP in the last 168 hours.  Imaging No results found.   STUDIES:  Chest ct scan (8/17) Bilateral groundglass like infiltrates, small pleural effusions bilateral.  CULTURES: Blood 8/16 > MRSA 8/19 >   ANTIBIOTICS: Cefepime 8/16 > Vanc 8/17 >   SIGNIFICANT EVENTS: 8/16 admit for sepsis syndrome, low BP, mild lactic acidosis. Hypotension persisted with resp distress. Transfer to SDU. PCCM consult.   LINES/TUBES: PICC  DISCUSSION: 80 year old female, with remote history of breast cancer, non-Hodgkin's lymphoma, admitted for dyspnea, not feeling well, sore throat. Patient being treated as sepsis probably related to healthcare associated pneumonia. Blood pressure has been soft since admission. There is concern about further hypotension  And respiratory distress in the floors on 8/19 so she was transferred to stepdown unit.  ASSESSMENT / PLAN:  PULMONARY A: Acute on chronic hypoxemic respiratory failure secondary to the following: 1. My main consideration is pulmonary edema. Improved with diuresis.  2. Concern for healthcare associated pneumonia. 3. Diastolic dysfunction exacerbation. 4. Untreated obstructive sleep apnea. 5. Obesity hypoventilation syndrome. 6. Demand ischemia P:   Clinically improved with BiPaP and diuresis. (no lasix given the last 1-2 days as her creat increased).  Bipap prn. Needs BipaP at [facility] for now.  Pulmicort neb bid, atrovent q6 Continue antibiotics, broad-spectrum > may deescalate in 1-2 days if she continues to improve.    CARDIOVASCULAR A:  Diastolic dysfunction. CAD Demand ischemia. Pulmonary edema Hypotension which might be related to sepsis +/- chronic hypotension > better with levophed.  Pt is not on chronic steroids.  P:  Bipap on and off. Needs BipaP at [facility] Cont levophed for now > try to wean off.  Holding off on steroids.    RENAL A:    Acute kidney injury.  P:   Based on Is and Os, she is (-) 1L. Her creatinine doubled from baseline so no further diuresis has been done. Observe creatinine for now.    GASTROINTESTINAL A:   GERD P:   PPI Diet as tolerated off Bipap.   HEMATOLOGIC A:   Hodgkin's lymphoma, currently in remission.  Although recent imaging with lymphadenopathy which have been stable. P:  Follws with Dr. Martha Clan.   INFECTIOUS A:   Possible healthcare associated pneumonia. Hypotension related to sepsis.  P:   Continue cefepime and vancomycin. On levophed at 20 mcg/kg/min and BP better at 120 sys. Try to wean off levophed.  Follow cultures. Lactic acid is reassuring. Hopefully, we can deescalate in 1-2 days.   ENDOCRINE A:   (-) issues  P:   CBG q 4 if NPO. Sliding scale.   NEUROLOGIC A:   Anxiety issues. depression P:   RASS goal: 0 Observe On elavil, ativan prn.   FAMILY  - Updates: no family around. I extensively discussed the plan of care with the patient.  - Inter-disciplinary family meet or Palliative Care meeting due by:  8/26  I spent 30 minutes of critical care time with this patient today.   Monica Becton, MD Pulmonary and [place] Pager: [phone] After 3 pm or if no response, call [phone]  10/05/2015, 9:21 AM

## 2015-10-05 NOTE — Care Management Important Message (Signed)
Important Message  Patient Details  Name: Michelle Redpath Diekman MRN: [id number] Date of Birth: 1935/09/26   Medicare Important Message Given:  Yes    Apolonio Schneiders, RN 10/05/2015, 2:08 PM

## 2015-10-05 NOTE — Progress Notes (Signed)
PROGRESS NOTE    [name]  [id number] DOB: [birth year] DOA: 10/14/2015 PCP:  Melinda Crutch, MD   Brief Narrative:  80 y.o. woman with a remote history of breast cancer, Non-Hodgkin's lymphoma (followed by Dr. Marin Olp, patient reports recent PET scan x 2 suggestive of recurrent disease, due to see Dr. Marin Olp again in October), CAD, HTN, HLD, grade I diastolic dysfunction, and chronic debility she was wheelchair bound even prior to being admitted to SNF after her last discharge earlier this month.  Presents with hypotension  Assessment & Plan:   Principal Problem:   Leukocytosis/ suspected Cellulitis - We'll continue current antibiotic regimen cefepime and vancomycin - Continue supportive therapy - reassess wbc next am.  Hypotension - critical care team on board. Difficult in that patient is edematous with reported findings of suspicious of pulmonary edema. - placed on levophed  Active Problems:   Coronary atherosclerosis - Stable no chest pain reported    Edema - Stable   DVT prophylaxis: Lovenox Code Status: Full Family Communication: None at bedside Disposition Plan: Pending improvement in condition   Consultants:   None   Procedures: None   Antimicrobials: Vancomycin and cefepime   Subjective: Patient has no new complaints. She reports feeling more alert today.   Objective: Vitals:   10/05/15 1145 10/05/15 1200 10/05/15 1230 10/05/15 1245  BP: (!) 82/56 (!) 165/44 (!) 155/44 (!) 88/51  Pulse: 96 (!) 101 99 100  Resp: (!) 24 (!) 23 (!) 28 (!) 21  Temp:      TempSrc:      SpO2: 98% 99% 97% (!) 75%  Weight:      Height:        Intake/Output Summary (Last 24 hours) at 10/05/15 1326 Last data filed at 10/05/15 1050  Gross per 24 hour  Intake          1131.74 ml  Output             1675 ml  Net          -543.26 ml   Filed Weights   10/02/15 0600  Weight: 93 kg (205 lb 0.4 oz)    Examination:  General exam: Appears calm and comfortable, in  nad. Respiratory system: equal chest rise, no wheezes Cardiovascular system: S1 & S2 heard, RRR. No JVD, murmurs, rubs, gallops or clicks. No pedal edema. Gastrointestinal system: Abdomen is nondistended, soft and nontender. No organomegaly or masses felt. Normal bowel sounds heard. obese Central nervous system: Alert and oriented. No focal neurological deficits. Extremities: Symmetric 5 x 5 power. Skin: No rashes, lesions or ulcers Psychiatry: Mood & affect appropriate.   Data Reviewed: I have personally reviewed following labs and imaging studies  CBC:  Recent Labs Lab 09/30/2015 1726 10/04/15 0440  WBC 24.4* 25.3*  NEUTROABS 19.9*  --   HGB 10.8* 8.7*  HCT 33.5* 27.8*  MCV 82.1 81.8  PLT 379 [id number]   Basic Metabolic Panel:  Recent Labs Lab 09/16/2015 1726 10/05/2015 2249 10/03/15 2130 10/04/15 0440 10/05/15 0406  NA 136  --   --  135  --   K 4.0  --   --  4.4  --   CL 106  --   --  106  --   CO2 24  --   --  22  --   GLUCOSE 133*  --   --  120*  --   BUN 14  --   --  36*  --   CREATININE 0.69  --  1.62* 1.61* 1.34*  CALCIUM 8.3*  --   --  8.0*  --   MG  --  1.8  --   --   --   PHOS  --  4.2  --   --   --    GFR: Estimated Creatinine Clearance (by C-G formula based on SCr of 1.34 mg/dL) Female: 39 mL/min Female: 47.6 mL/min Liver Function Tests:  Recent Labs Lab 09/18/2015 2249  AST 129*  ALT 135*  ALKPHOS 206*  BILITOT 0.5  PROT 4.7*  ALBUMIN 2.1*   No results for input(s): LIPASE, AMYLASE in the last 168 hours. No results for input(s): AMMONIA in the last 168 hours. Coagulation Profile: No results for input(s): INR, PROTIME in the last 168 hours. Cardiac Enzymes:  Recent Labs Lab 09/22/2015 2249 10/04/15 2203 10/05/15 0406 10/05/15 1000  CKTOTAL 22*  --   --   --   TROPONINI  --  0.05* 0.03* 0.04*   BNP (last 3 results) No results for input(s): PROBNP in the last 8760 hours. HbA1C: No results for input(s): HGBA1C in the last 72 hours. CBG: No  results for input(s): GLUCAP in the last 168 hours. Lipid Profile: No results for input(s): CHOL, HDL, LDLCALC, TRIG, CHOLHDL, LDLDIRECT in the last 72 hours. Thyroid Function Tests: No results for input(s): TSH, T4TOTAL, FREET4, T3FREE, THYROIDAB in the last 72 hours. Anemia Panel: No results for input(s): VITAMINB12, FOLATE, FERRITIN, TIBC, IRON, RETICCTPCT in the last 72 hours. Sepsis Labs:  Recent Labs Lab 10/14/2015 1941 10/04/15 1812 10/04/15 2203 10/05/15 0406  PROCALCITON  --  0.52  --  0.50  LATICACIDVEN 1.52 1.1 1.3  --     Recent Results (from the past 240 hour(s))  Rapid strep screen     Status: None   Collection Time: 09/28/2015  4:00 PM  Result Value Ref Range Status   Streptococcus, Group A Screen (Direct) NEGATIVE NEGATIVE Final    Comment: (NOTE) A Rapid Antigen test may result negative if the antigen level in the sample is below the detection level of this test. The FDA has not cleared this test as a stand-alone test therefore the rapid antigen negative result has reflexed to a Group A Strep culture.   Culture, group A strep     Status: None   Collection Time: 09/21/2015  4:00 PM  Result Value Ref Range Status   Specimen Description THROAT  Final   Special Requests NONE Reflexed from [id number]  Final   Culture   Final    NO GROUP A STREP (S.PYOGENES) ISOLATED Performed at [facility]    Report Status 10/04/2015 FINAL  Final  Culture, blood (routine x 2)     Status: None (Preliminary result)   Collection Time: 09/24/2015  6:19 PM  Result Value Ref Range Status   Specimen Description BLOOD BLOOD LEFT ARM  Final   Special Requests BOTTLES DRAWN AEROBIC AND ANAEROBIC 10CC  Final   Culture   Final    NO GROWTH 3 DAYS Performed at [facility]    Report Status PENDING  Incomplete  Culture, blood (routine x 2)     Status: None (Preliminary result)   Collection Time: 10/10/2015  6:19 PM  Result Value Ref Range Status   Specimen Description BLOOD BLOOD  LEFT ARM  Final   Special Requests BOTTLES DRAWN AEROBIC AND ANAEROBIC 10CC  Final   Culture   Final    NO GROWTH 3 DAYS Performed at [facility]  Report Status PENDING  Incomplete  MRSA PCR Screening     Status: None   Collection Time: 10/04/15  5:02 PM  Result Value Ref Range Status   MRSA by PCR NEGATIVE NEGATIVE Final    Comment:        The GeneXpert MRSA Assay (FDA approved for NASAL specimens only), is one component of a comprehensive MRSA colonization surveillance program. It is not intended to diagnose MRSA infection nor to guide or monitor treatment for MRSA infections.          Radiology Studies: Dg Chest Port 1 View  Result Date: 10/04/2015 CLINICAL DATA:  Shortness of breath.  Chest pain. EXAM: PORTABLE CHEST 1 VIEW COMPARISON:  Chest CT 10/02/2015 and chest radiograph 10/16/2015 FINDINGS: There is a left approach PICC line with tip at the confluence of the left brachiocephalic vein and superior vena cava. Cardiomediastinal silhouette is unchanged. There is shallow lung inflation with diffuse reticular opacities, right greater than left. There are bilateral small pleural effusions, larger on the left. No visible pneumothorax. IMPRESSION: 1. Worsening of diffuse reticular opacities, right worse than left. Findings may indicate worsening pulmonary edema versus an atypical infectious process. There is likely a superimposed on pulmonary fibrosis. 2. Left approach PICC line terminating at the confluence of the brachiocephalic vein and SVC. 3. Small bilateral pleural effusions, slightly larger on left. Electronically Signed   By: Ulyses Jarred M.D.   On: 10/04/2015 06:57    Scheduled Meds: . amitriptyline  100 mg Oral QHS  . aspirin EC  81 mg Oral Q breakfast  . atorvastatin  80 mg Oral Q breakfast  . budesonide (PULMICORT) nebulizer solution  0.5 mg Nebulization BID  . ceFEPime (MAXIPIME) IV  1 g Intravenous Q12H  . cholecalciferol  1,000 Units Oral q morning  - 10a  . enoxaparin (LOVENOX) injection  40 mg Subcutaneous Q24H  . ipratropium  0.5 mg Nebulization Q6H  . LORazepam  2 mg Oral BID  . polyethylene glycol  17 g Oral Daily  . protein supplement  2 oz Oral QID  . sodium chloride  250 mL Intravenous Once  . vancomycin  750 mg Intravenous BID   Continuous Infusions: . sodium chloride 20 mL/hr at 10/05/15 0800  . norepinephrine (LEVOPHED) Adult infusion 31 mcg/min (10/05/15 1253)     LOS: 3 days   Time spent: > 35 minutes  Velvet Bathe, MD [facility] Pager [phone]  If 7PM-7AM, please contact night-coverage [email or web address] Password [email or web address] 10/05/2015, 1:26 PM

## 2015-10-06 DIAGNOSIS — L8990 Pressure ulcer of unspecified site, unspecified stage: Secondary | ICD-10-CM

## 2015-10-06 LAB — CBC
HEMATOCRIT: 30.5 % — AB (ref 36.0–46.0)
HEMOGLOBIN: 9.7 g/dL — AB (ref 12.0–15.0)
MCH: 25.7 pg — ABNORMAL LOW (ref 26.0–34.0)
MCHC: 31.8 g/dL (ref 30.0–36.0)
MCV: 80.9 fL (ref 78.0–100.0)
Platelets: 376 10*3/uL (ref 150–400)
RBC: 3.77 MIL/uL — AB (ref 3.87–5.11)
RDW: 18.7 % — AB (ref 11.5–15.5)
WBC: 27.5 10*3/uL — AB (ref 4.0–10.5)

## 2015-10-06 LAB — CULTURE, BLOOD (ROUTINE X 2)
Culture: NO GROWTH
Culture: NO GROWTH

## 2015-10-06 LAB — BASIC METABOLIC PANEL
ANION GAP: 7 (ref 5–15)
BUN: 25 mg/dL — ABNORMAL HIGH (ref 6–20)
CALCIUM: 7.9 mg/dL — AB (ref 8.9–10.3)
CO2: 19 mmol/L — AB (ref 22–32)
Chloride: 107 mmol/L (ref 101–111)
Creatinine, Ser: 0.92 mg/dL (ref 0.44–1.00)
GFR calc non Af Amer: 58 mL/min — ABNORMAL LOW (ref >60)
GLUCOSE: 145 mg/dL — AB (ref 65–99)
POTASSIUM: 4.9 mmol/L (ref 3.5–5.1)
Sodium: 133 mmol/L — ABNORMAL LOW (ref 135–145)

## 2015-10-06 LAB — CORTISOL: CORTISOL PLASMA: 11.7 ug/dL

## 2015-10-06 LAB — PROCALCITONIN: PROCALCITONIN: 0.69 ng/mL

## 2015-10-06 MED ORDER — AMITRIPTYLINE HCL 25 MG PO TABS
25.0000 mg | ORAL_TABLET | Freq: Every day | ORAL | Status: DC
  Administered 2015-10-06 – 2015-10-12 (×7): 25 mg via ORAL
  Filled 2015-10-06 (×7): qty 1

## 2015-10-06 MED ORDER — AMITRIPTYLINE HCL 25 MG PO TABS: 50.0000 mg | ORAL_TABLET | Freq: Every day | ORAL | Status: DC

## 2015-10-06 MED ORDER — DEXTROSE 5 % IV SOLN
1.0000 g | Freq: Three times a day (TID) | INTRAVENOUS | Status: DC
  Administered 2015-10-06 – 2015-10-11 (×11): 1 g via INTRAVENOUS
  Filled 2015-10-06 (×17): qty 1

## 2015-10-06 NOTE — Progress Notes (Signed)
Patient having low blood pressures, despite being awakened and cuff repositioned. ELink called about parameters for restarting Levo. Patient having stable mentation.

## 2015-10-06 NOTE — Progress Notes (Signed)
PULMONARY / CRITICAL CARE MEDICINE   Name: [name] MRN: [id number] DOB: Aug 01, 1935    ADMISSION DATE:  10/16/2015 CONSULTATION DATE:  10/04/15  REFERRING MD:  Dr. Wendee Beavers  CHIEF COMPLAINT:  SOB. PCCM consult for resp distress.   HISTORY OF PRESENT ILLNESS:   [name] is a 80 y.o. woman with a remote history of breast cancer, Non-Hodgkin's lymphoma (followed by Dr. Marin Olp, patient reports recent PET scan x 2 suggestive of recurrent disease, due to see Dr. Marin Olp again in October), CAD, HTN, HLD, grade I diastolic dysfunction, and chronic debility (she was wheelchair bound even prior to being admitted to SNF after her last discharge earlier this month).    She presented to the ED 8/16 with SOB/sore throat. Sepsis was considered. Placed on abx. She was recently admitted for sepsis from UTI. She received adequate volume resuscitation. CT scan of the abdomen was concerning for possible "mesenteric stranding". Cultures negative so far. Being treated as cellulitis per hospitalist note.   When she came in, she was hypotensive. She responded to IV fluids. On the floors, they have difficulty getting adequate blood pressure. They have been getting blood pressure through L lower extremity cuff. Blood pressure 80-90/40-50.  She is mentating well. Making adequate urine per RN.  She was noted 8/19 to be more hypotensive than 1-2 days ago. She ended up getting a fluid bolus of 250 mls. She was noted to be tachypneic after getting a fluid bolus.  PCCM consulted for hypotension/resp distress.   Patient states her diastolic blood pressure has chronically been low. She uses oxygen 2 L at bedtime and with exertion. No history of other malignancy. No history of blood clot. Non smoker. No h/o copd,asthma.    SUBJECTIVE:  RN reports pt remains on levophed at 23 mcg, labile BP monitoring on LE, unable to assess in arms due to PICC line and prior mastectomy.  Pt known to me from prior admit with similar  situation of "hypotension", placed an aline and she was hypertensive.     VITAL SIGNS: BP (!) 116/44   Pulse 98   Temp 98.4 F (36.9 C) (Oral)   Resp (!) 29   Ht 5\' 5"  (1.651 m)   Wt 205 lb 0.4 oz (93 kg)   SpO2 95%   BMI 34.12 kg/m   HEMODYNAMICS:    VENTILATOR SETTINGS:    INTAKE / OUTPUT: I/O last 3 completed shifts: In: 2014.4 [P.O.:120; I.V.:1644.4; IV Piggyback:250] Out: 2800 [Urine:2800]  PHYSICAL EXAMINATION: General:  Morbidly obese female in NAD Neuro:  Drowsy, arouses to voice, speech clear, oriented. HEENT:  Pupils reactive bilaterally. No oral thrush. Poor dentition Cardiovascular: s1s2 rrr, no m/r/g.  SR on monitor  Lungs:  Diminished bilaterally, crackles at bases  Abdomen:  Possible bowel sounds.  Soft. Obese. Nontender.   Musculoskeletal:  no gross abnormalities. 2+ BLE edema. Skin:  Warm and dry. No rash.  LABS:  BMET  Recent Labs Lab 10/03/2015 1726  10/04/15 0440 10/05/15 0406 10/06/15 0338  NA 136  --  135  --  133*  K 4.0  --  4.4  --  4.9  CL 106  --  106  --  107  CO2 24  --  22  --  19*  BUN 14  --  36*  --  25*  CREATININE 0.69  < > 1.61* 1.34* 0.92  GLUCOSE 133*  --  120*  --  145*  < > = values in this interval  not displayed.  Electrolytes  Recent Labs Lab 10/11/2015 1726 09/30/2015 2249 10/04/15 0440 10/06/15 0338  CALCIUM 8.3*  --  8.0* 7.9*  MG  --  1.8  --   --   PHOS  --  4.2  --   --     CBC  Recent Labs Lab 10/11/2015 1726 10/04/15 0440 10/06/15 0338  WBC 24.4* 25.3* 27.5*  HGB 10.8* 8.7* 9.7*  HCT 33.5* 27.8* 30.5*  PLT 379 334 376    Coag's No results for input(s): APTT, INR in the last 168 hours.  Sepsis Markers  Recent Labs Lab 09/27/2015 1941 10/04/15 1812 10/04/15 2203 10/05/15 0406 10/06/15 0338  LATICACIDVEN 1.52 1.1 1.3  --   --   PROCALCITON  --  0.52  --  0.50 0.69    ABG  Recent Labs Lab 10/05/15 0353  PHART 7.369  PCO2ART 34.6*  PO2ART 90.3    Liver Enzymes  Recent  Labs Lab 10/16/2015 2249  AST 129*  ALT 135*  ALKPHOS 206*  BILITOT 0.5  ALBUMIN 2.1*    Cardiac Enzymes  Recent Labs Lab 10/04/15 2203 10/05/15 0406 10/05/15 1000  TROPONINI 0.05* 0.03* 0.04*    Glucose No results for input(s): GLUCAP in the last 168 hours.  Imaging No results found.   STUDIES:  CT Chest 8/17 >> diffuse septal thickening & GGO of the lung suspicious for chronic interstitial pulmonary edema, atypical PNA consider less likely, no consolidation, possible background of reticulation of the lung peripheries may represent a component of fibrosis, small bilateral pleural effusion, small pericardial effusion, mediastinal LAN unchanged from prior PET-CT   CULTURES: Blood 8/16 >> MRSA 8/19 >> negative  ANTIBIOTICS: Cefepime 8/16 >> Vanc 8/17 >> 8/21  SIGNIFICANT EVENTS: 8/16 admit for sepsis syndrome, low BP, mild lactic acidosis. Hypotension persisted with resp distress. Transfer to SDU. PCCM consult.   LINES/TUBES: PICC LUE 8/17 >>   DISCUSSION: 81 year old female, with remote history of breast cancer, non-Hodgkin's lymphoma, admitted for dyspnea, not feeling well, sore throat. Patient being treated as sepsis probably related to healthcare associated pneumonia. Blood pressure has been soft since admission. There is concern about further hypotension and respiratory distress in the floors on 8/19 so she was transferred to stepdown unit.  ASSESSMENT / PLAN:  PULMONARY A: Acute on chronic hypoxemic respiratory failure secondary to the following: 1. Pulmonary edema. Improved with diuresis.  2. Concern for healthcare associated pneumonia. 3. Diastolic dysfunction exacerbation. 4. Untreated obstructive sleep apnea. 5. Obesity hypoventilation syndrome. 6. Demand ischemia P:   Clinically improved with BiPAP & diuresis  BiPAP QHS & PRN daytime sleep for now Pulmicort BID + Atrovent q6 Trend CXR See ID   Repeat CXR 8/21  O2 to support saturations >  90%  CARDIOVASCULAR A:  Diastolic dysfunction. CAD Demand ischemia. Pulmonary edema Hypotension - which might be related to sepsis +/- chronic hypotension.  Pt is not on chronic steroids.  Rule out AI.  Prior admit with similar and pt not hypotensive when aline placed P:  Wean Levophed off > if normal mental status and making urine.  Absence of elevated lactic reassuring.  Assess cortisol  Hold further diuresis with rise in sr cr, 'hypotension'  RENAL A:   Acute kidney injury - in setting of possible sepsis, hypotension.  Resolving. P:   Monitor BMP / UOP  Replace electrolytes as indicated  Ensure adequate renal perfusion / avoid nephrotoxic agents   GASTROINTESTINAL A:   GERD P:   PPI Diet as  tolerated when off Bipap.   HEMATOLOGIC A:   Hodgkin's lymphoma, currently in remission.  Although recent imaging with lymphadenopathy which have been stable. P:  Follows with Dr. Martha Clan.   INFECTIOUS A:   Possible healthcare associated pneumonia. Hypotension related to sepsis.  P:   Continue cefepime  D/C vancomycin 8/21.  MRSA negative, culture negative to date Follow cultures.   ENDOCRINE A:   Mild Hyperglycemia P:   CBG q 4 if NPO.  Sliding scale.   NEUROLOGIC A:   Anxiety issues. Depression P:   RASS goal: 0 Reduce elavil to 25 mg QHS with pt being drowsy, 'hypotensive' On elavil, ativan prn.   FAMILY  - Updates:  Patient updated on plan of care  - Inter-disciplinary family meet or Palliative Care meeting due by:  8/26  Noe Gens, NP-C [facility] Pgr: [phone] or if no answer [phone] 10/06/2015, 10:03 AM

## 2015-10-06 NOTE — Progress Notes (Signed)
eLink Physician-Brief Progress Note Patient Name: Melah Schupbach Nuss DOB: 18-Nov-1935 MRN: [id number]   Date of Service  10/06/2015  HPI/Events of Note  Int low diastolics, made excellent output today with lasix and crt falling with this approach pcxr and CT chest c/w chronic int lung dz  eICU Interventions  Accept sys 85-90 with good neurostatus, would like 15 cc/hr overall with corrected ideal body wt for height She has made excellent urine throughout day     Intervention Category Intermediate Interventions: Hypotension - evaluation and management  FEINSTEIN,DANIEL J. 10/06/2015, 10:11 PM

## 2015-10-06 NOTE — Consult Note (Signed)
   [facility] CM Inpatient Consult   10/06/2015  [name] 1935-10-01 [id number]   Outpatient MD referral was received for [place] Management services. Aware of hospital admission. Patient has been at [facility]. Will engage for potential [facility] Care Management services at appropriate time. Noted patient is in stepdown. Will continue to follow.    Marthenia Rolling, MSN-Ed, RN,BSN [facility] Liaison [phone]

## 2015-10-06 NOTE — Progress Notes (Signed)
PROGRESS NOTE    [name]  [id number] DOB: [birth year] DOA: 09/27/2015 PCP:  Melinda Crutch, MD   Brief Narrative:  80 y.o. woman with a remote history of breast cancer, Non-Hodgkin's lymphoma (followed by Dr. Marin Olp, patient reports recent PET scan x 2 suggestive of recurrent disease, due to see Dr. Marin Olp again in October), CAD, HTN, HLD, grade I diastolic dysfunction, and chronic debility she was wheelchair bound even prior to being admitted to SNF after her last discharge earlier this month.  Presents with hypotension  Assessment & Plan:   Principal Problem:   Leukocytosis/ initially suspected cellulitis but no real evidence of this on physical exam. Currently suspecting lung as source possibly pna. - We'll continue current antibiotic regimen cefepime and vancomycin - Continue supportive therapy - reassess wbc next am.  Hypotension - critical care team on board. Difficult in that patient is edematous with reported findings of suspicious of pulmonary edema. - placed on levophed  Active Problems:   Coronary atherosclerosis - Stable no chest pain reported    Edema - Stable   DVT prophylaxis: Lovenox Code Status: Full Family Communication: None at bedside Disposition Plan: Pending improvement in condition   Consultants:   None   Procedures: None   Antimicrobials: Vancomycin and cefepime   Subjective: Pt feels better currently  Objective: Vitals:   10/06/15 1030 10/06/15 1045 10/06/15 1100 10/06/15 1115  BP: (!) 116/53 (!) 94/45 (!) 119/44 (!) 112/46  Pulse: (!) 101 (!) 101 97 95  Resp: (!) 32 (!) 31 (!) 28 (!) 29  Temp:      TempSrc:      SpO2: 92% 91% 93% 91%  Weight:      Height:        Intake/Output Summary (Last 24 hours) at 10/06/15 1127 Last data filed at 10/06/15 1100  Gross per 24 hour  Intake          1167.28 ml  Output             1300 ml  Net          -132.72 ml   Filed Weights   10/02/15 0600  Weight: 93 kg (205 lb 0.4 oz)     Examination:  General exam: Appears calm and comfortable, in nad. Respiratory system: equal chest rise, no wheezes Cardiovascular system: S1 & S2 heard, RRR. No JVD, murmurs, rubs, gallops or clicks. No pedal edema. Gastrointestinal system: Abdomen is nondistended, soft and nontender. No organomegaly or masses felt. Normal bowel sounds heard. obese Central nervous system: Alert and oriented. No focal neurological deficits. Extremities: Symmetric 5 x 5 power. Skin: No rashes, lesions or ulcers Psychiatry: Mood & affect appropriate.   Data Reviewed: I have personally reviewed following labs and imaging studies  CBC:  Recent Labs Lab 09/17/2015 1726 10/04/15 0440 10/06/15 0338  WBC 24.4* 25.3* 27.5*  NEUTROABS 19.9*  --   --   HGB 10.8* 8.7* 9.7*  HCT 33.5* 27.8* 30.5*  MCV 82.1 81.8 80.9  PLT 379 334 [id number]   Basic Metabolic Panel:  Recent Labs Lab 09/19/2015 1726 09/19/2015 2249 10/03/15 2130 10/04/15 0440 10/05/15 0406 10/06/15 0338  NA 136  --   --  135  --  133*  K 4.0  --   --  4.4  --  4.9  CL 106  --   --  106  --  107  CO2 24  --   --  22  --  19*  GLUCOSE 133*  --   --  120*  --  145*  BUN 14  --   --  36*  --  25*  CREATININE 0.69  --  1.62* 1.61* 1.34* 0.92  CALCIUM 8.3*  --   --  8.0*  --  7.9*  MG  --  1.8  --   --   --   --   PHOS  --  4.2  --   --   --   --    GFR: Estimated Creatinine Clearance (by C-G formula based on SCr of 0.92 mg/dL) Female: 56.8 mL/min Female: 69.4 mL/min Liver Function Tests:  Recent Labs Lab 09/28/2015 2249  AST 129*  ALT 135*  ALKPHOS 206*  BILITOT 0.5  PROT 4.7*  ALBUMIN 2.1*   No results for input(s): LIPASE, AMYLASE in the last 168 hours. No results for input(s): AMMONIA in the last 168 hours. Coagulation Profile: No results for input(s): INR, PROTIME in the last 168 hours. Cardiac Enzymes:  Recent Labs Lab 09/21/2015 2249 10/04/15 2203 10/05/15 0406 10/05/15 1000  CKTOTAL 22*  --   --   --   TROPONINI  --   0.05* 0.03* 0.04*   BNP (last 3 results) No results for input(s): PROBNP in the last 8760 hours. HbA1C: No results for input(s): HGBA1C in the last 72 hours. CBG: No results for input(s): GLUCAP in the last 168 hours. Lipid Profile: No results for input(s): CHOL, HDL, LDLCALC, TRIG, CHOLHDL, LDLDIRECT in the last 72 hours. Thyroid Function Tests: No results for input(s): TSH, T4TOTAL, FREET4, T3FREE, THYROIDAB in the last 72 hours. Anemia Panel: No results for input(s): VITAMINB12, FOLATE, FERRITIN, TIBC, IRON, RETICCTPCT in the last 72 hours. Sepsis Labs:  Recent Labs Lab 10/07/2015 1941 10/04/15 1812 10/04/15 2203 10/05/15 0406 10/06/15 0338  PROCALCITON  --  0.52  --  0.50 0.69  LATICACIDVEN 1.52 1.1 1.3  --   --     Recent Results (from the past 240 hour(s))  Rapid strep screen     Status: None   Collection Time: 10/10/2015  4:00 PM  Result Value Ref Range Status   Streptococcus, Group A Screen (Direct) NEGATIVE NEGATIVE Final    Comment: (NOTE) A Rapid Antigen test may result negative if the antigen level in the sample is below the detection level of this test. The FDA has not cleared this test as a stand-alone test therefore the rapid antigen negative result has reflexed to a Group A Strep culture.   Culture, group A strep     Status: None   Collection Time: 09/24/2015  4:00 PM  Result Value Ref Range Status   Specimen Description THROAT  Final   Special Requests NONE Reflexed from [id number]  Final   Culture   Final    NO GROUP A STREP (S.PYOGENES) ISOLATED Performed at [facility]    Report Status 10/04/2015 FINAL  Final  Culture, blood (routine x 2)     Status: None (Preliminary result)   Collection Time: 10/14/2015  6:19 PM  Result Value Ref Range Status   Specimen Description BLOOD BLOOD LEFT ARM  Final   Special Requests BOTTLES DRAWN AEROBIC AND ANAEROBIC 10CC  Final   Culture   Final    NO GROWTH 4 DAYS Performed at [facility]    Report  Status PENDING  Incomplete  Culture, blood (routine x 2)     Status: None (Preliminary result)   Collection Time: 10/14/2015  6:19 PM  Result Value Ref Range Status  Specimen Description BLOOD BLOOD LEFT ARM  Final   Special Requests BOTTLES DRAWN AEROBIC AND ANAEROBIC 10CC  Final   Culture   Final    NO GROWTH 4 DAYS Performed at [facility]    Report Status PENDING  Incomplete  MRSA PCR Screening     Status: None   Collection Time: 10/04/15  5:02 PM  Result Value Ref Range Status   MRSA by PCR NEGATIVE NEGATIVE Final    Comment:        The GeneXpert MRSA Assay (FDA approved for NASAL specimens only), is one component of a comprehensive MRSA colonization surveillance program. It is not intended to diagnose MRSA infection nor to guide or monitor treatment for MRSA infections.          Radiology Studies: No results found.  Scheduled Meds: . amitriptyline  25 mg Oral QHS  . aspirin EC  81 mg Oral Q breakfast  . atorvastatin  80 mg Oral Q breakfast  . budesonide (PULMICORT) nebulizer solution  0.5 mg Nebulization BID  . ceFEPime (MAXIPIME) IV  1 g Intravenous Q8H  . cholecalciferol  1,000 Units Oral q morning - 10a  . enoxaparin (LOVENOX) injection  40 mg Subcutaneous Q24H  . ipratropium  0.5 mg Nebulization Q6H  . LORazepam  2 mg Oral BID  . polyethylene glycol  17 g Oral Daily  . protein supplement  2 oz Oral QID  . sodium chloride  250 mL Intravenous Once   Continuous Infusions: . sodium chloride 20 mL/hr at 10/06/15 1100  . norepinephrine (LEVOPHED) Adult infusion 20 mcg/min (10/06/15 1117)     LOS: 4 days   Time spent: > 35 minutes  Velvet Bathe, MD [facility] Pager [phone]  If 7PM-7AM, please contact night-coverage [email or web address] Password [facility] 10/06/2015, 11:27 AM

## 2015-10-06 NOTE — Progress Notes (Signed)
Pharmacy Antibiotic Note  [name] is a 80 y.o. female admitted on 09/28/2015 with suspected HCAP.  Pharmacy has been following for Vancomycin dosing and antibiotic renal dose adjustment.  Today, 10/06/2015: Vancomycin d/c'd today since MRSA PCR negative SCr improved. No fevers but WBC remain elevated; PCT remaining slightly elevated. Still requiring pressors for BP support.  Plan: With improved SCr and WBC/PCT remaining elevated, will adjust Cefepime to 1g IV q8h.  Will follow peripherally for further dose adjustment.  Height: 5\' 5"  (165.1 cm) Weight: 205 lb 0.4 oz (93 kg) IBW/kg (Calculated) : 57  Last weight documented as 93 kg (09/18/15)  Temp (24hrs), Avg:99 F (37.2 C), Min:98.4 F (36.9 C), Max:100.3 F (37.9 C)   Recent Labs Lab 09/29/2015 1726 09/23/2015 1941 10/03/15 2130 10/04/15 0440 10/04/15 1000 10/04/15 1812 10/04/15 2203 10/05/15 0406 10/06/15 0338  WBC 24.4*  --   --  25.3*  --   --   --   --  27.5*  CREATININE 0.69  --  1.62* 1.61*  --   --   --  1.34* 0.92  LATICACIDVEN  --  1.52  --   --   --  1.1 1.3  --   --   VANCOTROUGH  --   --  36*  --   --   --   --   --   --   VANCORANDOM  --   --   --   --  27  --   --   --   --     Estimated Creatinine Clearance (by C-G formula based on SCr of 0.92 mg/dL) Female: 56.8 mL/min Female: 69.4 mL/min    No Known Allergies  Antimicrobials this admission: 8/16 Cefepime >>  8/16 Vancomycin >> 8/21  Dose adjustments this admission: 8/18 VT= 36 on 1250mg  q12hr prior doses charted correctly. 8/19 VR @1000  = 27  Microbiology results: 8/16 BCx: ngtd 8/16 Rapid strep screen: neg 8/16 Group A strep cxt: neg 8/19 MRSA PCR: neg  Thank you for allowing pharmacy to be a part of this patient's care.  Hershal Coria, PharmD, BCPS Pager: [phone] 10/06/2015 10:39 AM

## 2015-10-07 ENCOUNTER — Inpatient Hospital Stay (HOSPITAL_COMMUNITY): Payer: PPO

## 2015-10-07 DIAGNOSIS — N390 Urinary tract infection, site not specified: Secondary | ICD-10-CM

## 2015-10-07 DIAGNOSIS — I950 Idiopathic hypotension: Secondary | ICD-10-CM

## 2015-10-07 LAB — COMPREHENSIVE METABOLIC PANEL
ALT: 123 U/L — ABNORMAL HIGH (ref 14–54)
ANION GAP: 5 (ref 5–15)
AST: 96 U/L — AB (ref 15–41)
Albumin: 1.8 g/dL — ABNORMAL LOW (ref 3.5–5.0)
Alkaline Phosphatase: 183 U/L — ABNORMAL HIGH (ref 38–126)
BUN: 29 mg/dL — ABNORMAL HIGH (ref 6–20)
CHLORIDE: 108 mmol/L (ref 101–111)
CO2: 22 mmol/L (ref 22–32)
Calcium: 8.2 mg/dL — ABNORMAL LOW (ref 8.9–10.3)
Creatinine, Ser: 1.05 mg/dL — ABNORMAL HIGH (ref 0.44–1.00)
GFR, EST AFRICAN AMERICAN: 57 mL/min — AB (ref >60)
GFR, EST NON AFRICAN AMERICAN: 50 mL/min — AB (ref >60)
Glucose, Bld: 103 mg/dL — ABNORMAL HIGH (ref 65–99)
POTASSIUM: 4.6 mmol/L (ref 3.5–5.1)
Sodium: 135 mmol/L (ref 135–145)
Total Bilirubin: 0.7 mg/dL (ref 0.3–1.2)
Total Protein: 4.5 g/dL — ABNORMAL LOW (ref 6.5–8.1)

## 2015-10-07 LAB — CBC
HCT: 26 % — ABNORMAL LOW (ref 36.0–46.0)
Hemoglobin: 8.2 g/dL — ABNORMAL LOW (ref 12.0–15.0)
MCH: 26.1 pg (ref 26.0–34.0)
MCHC: 31.5 g/dL (ref 30.0–36.0)
MCV: 82.8 fL (ref 78.0–100.0)
PLATELETS: 296 10*3/uL (ref 150–400)
RBC: 3.14 MIL/uL — ABNORMAL LOW (ref 3.87–5.11)
RDW: 18.9 % — AB (ref 11.5–15.5)
WBC: 23 10*3/uL — AB (ref 4.0–10.5)

## 2015-10-07 MED ORDER — BISACODYL 5 MG PO TBEC: 5.0000 mg | DELAYED_RELEASE_TABLET | Freq: Every day | ORAL | Status: DC | PRN

## 2015-10-07 MED ORDER — IPRATROPIUM-ALBUTEROL 0.5-2.5 (3) MG/3ML IN SOLN: 3.0000 mL | Freq: Four times a day (QID) | RESPIRATORY_TRACT | Status: DC | PRN

## 2015-10-07 MED ORDER — SENNA 8.6 MG PO TABS
1.0000 | ORAL_TABLET | Freq: Every day | ORAL | Status: DC | PRN
  Administered 2015-10-07: 8.6 mg via ORAL
  Filled 2015-10-07: qty 1

## 2015-10-07 NOTE — Progress Notes (Signed)
PROGRESS NOTE    [name]  [id number] DOB: 10-25-1935 DOA: 09/30/2015 PCP:  Melinda Crutch, MD   Brief Narrative:  80 y.o. woman with a remote history of breast cancer, Non-Hodgkin's lymphoma (followed by Dr. Marin Olp, patient reports recent PET scan x 2 suggestive of recurrent disease, due to see Dr. Marin Olp again in October), CAD, HTN, HLD, grade I diastolic dysfunction, and chronic debility she was wheelchair bound even prior to being admitted to SNF after her last discharge earlier this month.  Presents with hypotension  Assessment & Plan:   Principal Problem:   Leukocytosis/ initially suspected cellulitis but no real evidence of this on physical exam. Currently suspecting lung as source possibly pna. - We'll continue current antibiotic regimen cefepime and vancomycin may consider narrowing antibiotics next am.   Hypotension - critical care team on board.  - off pressors. Pulmonology has signed off 8/22  Active Problems:   Coronary atherosclerosis - Stable no chest pain reported    Edema - Stable   DVT prophylaxis: Lovenox Code Status: Full Family Communication: None at bedside Disposition Plan: Pending improvement in condition   Consultants:   Critical care team: signed off 8/22   Procedures: None   Antimicrobials: Vancomycin and cefepime   Subjective: Pt feels better currently. Has no new complaints.   Objective: Vitals:   10/07/15 1100 10/07/15 1200 10/07/15 1201 10/07/15 1300  BP: (!) 112/35 (!) 102/38  108/72  Pulse: 97 98  99  Resp: (!) 47 (!) 22  (!) 24  Temp:   98.5 F (36.9 C)   TempSrc:   Oral   SpO2: 96% 98%  96%  Weight:      Height:        Intake/Output Summary (Last 24 hours) at 10/07/15 1304 Last data filed at 10/07/15 1300  Gross per 24 hour  Intake           645.96 ml  Output                0 ml  Net           645.96 ml   Filed Weights   10/02/15 0600  Weight: 93 kg (205 lb 0.4 oz)    Examination:  General exam:  Appears calm and comfortable, in nad. Respiratory system: equal chest rise, no wheezes Cardiovascular system: S1 & S2 heard, RRR. No JVD, murmurs, rubs, gallops or clicks. No pedal edema. Gastrointestinal system: Abdomen is nondistended, soft and nontender. No organomegaly or masses felt. Normal bowel sounds heard. obese Central nervous system: Alert and oriented. No focal neurological deficits. Extremities: Symmetric 5 x 5 power. Skin: No rashes, lesions or ulcers Psychiatry: Mood & affect appropriate.   Data Reviewed: I have personally reviewed following labs and imaging studies  CBC:  Recent Labs Lab 10/12/2015 1726 10/04/15 0440 10/06/15 0338 10/07/15 0441  WBC 24.4* 25.3* 27.5* 23.0*  NEUTROABS 19.9*  --   --   --   HGB 10.8* 8.7* 9.7* 8.2*  HCT 33.5* 27.8* 30.5* 26.0*  MCV 82.1 81.8 80.9 82.8  PLT 379 334 376 0000000   Basic Metabolic Panel:  Recent Labs Lab 10/07/2015 1726 10/11/2015 2249 10/03/15 2130 10/04/15 0440 10/05/15 0406 10/06/15 0338 10/07/15 0441  NA 136  --   --  135  --  133* 135  K 4.0  --   --  4.4  --  4.9 4.6  CL 106  --   --  106  --  107 108  CO2 24  --   --  22  --  19* 22  GLUCOSE 133*  --   --  120*  --  145* 103*  BUN 14  --   --  36*  --  25* 29*  CREATININE 0.69  --  1.62* 1.61* 1.34* 0.92 1.05*  CALCIUM 8.3*  --   --  8.0*  --  7.9* 8.2*  MG  --  1.8  --   --   --   --   --   PHOS  --  4.2  --   --   --   --   --    GFR: Estimated Creatinine Clearance (by C-G formula based on SCr of 1.05 mg/dL) Female: 49.8 mL/min Female: 60.8 mL/min Liver Function Tests:  Recent Labs Lab 09/22/2015 2249 10/07/15 0441  AST 129* 96*  ALT 135* 123*  ALKPHOS 206* 183*  BILITOT 0.5 0.7  PROT 4.7* 4.5*  ALBUMIN 2.1* 1.8*   No results for input(s): LIPASE, AMYLASE in the last 168 hours. No results for input(s): AMMONIA in the last 168 hours. Coagulation Profile: No results for input(s): INR, PROTIME in the last 168 hours. Cardiac Enzymes:  Recent  Labs Lab 10/08/2015 2249 10/04/15 2203 10/05/15 0406 10/05/15 1000  CKTOTAL 22*  --   --   --   TROPONINI  --  0.05* 0.03* 0.04*   BNP (last 3 results) No results for input(s): PROBNP in the last 8760 hours. HbA1C: No results for input(s): HGBA1C in the last 72 hours. CBG: No results for input(s): GLUCAP in the last 168 hours. Lipid Profile: No results for input(s): CHOL, HDL, LDLCALC, TRIG, CHOLHDL, LDLDIRECT in the last 72 hours. Thyroid Function Tests: No results for input(s): TSH, T4TOTAL, FREET4, T3FREE, THYROIDAB in the last 72 hours. Anemia Panel: No results for input(s): VITAMINB12, FOLATE, FERRITIN, TIBC, IRON, RETICCTPCT in the last 72 hours. Sepsis Labs:  Recent Labs Lab 09/23/2015 1941 10/04/15 1812 10/04/15 2203 10/05/15 0406 10/06/15 0338  PROCALCITON  --  0.52  --  0.50 0.69  LATICACIDVEN 1.52 1.1 1.3  --   --     Recent Results (from the past 240 hour(s))  Rapid strep screen     Status: None   Collection Time: 09/29/2015  4:00 PM  Result Value Ref Range Status   Streptococcus, Group A Screen (Direct) NEGATIVE NEGATIVE Final    Comment: (NOTE) A Rapid Antigen test may result negative if the antigen level in the sample is below the detection level of this test. The FDA has not cleared this test as a stand-alone test therefore the rapid antigen negative result has reflexed to a Group A Strep culture.   Culture, group A strep     Status: None   Collection Time: 09/27/2015  4:00 PM  Result Value Ref Range Status   Specimen Description THROAT  Final   Special Requests NONE Reflexed from [id number]  Final   Culture   Final    NO GROUP A STREP (S.PYOGENES) ISOLATED Performed at [facility]    Report Status 10/04/2015 FINAL  Final  Culture, blood (routine x 2)     Status: None   Collection Time: 09/28/2015  6:19 PM  Result Value Ref Range Status   Specimen Description BLOOD BLOOD LEFT ARM  Final   Special Requests BOTTLES DRAWN AEROBIC AND ANAEROBIC 10CC   Final   Culture   Final    NO GROWTH 5 DAYS Performed at [facility]    Report Status 10/06/2015 FINAL  Final  Culture, blood (routine x 2)     Status: None   Collection Time: 09/30/2015  6:19 PM  Result Value Ref Range Status   Specimen Description BLOOD BLOOD LEFT ARM  Final   Special Requests BOTTLES DRAWN AEROBIC AND ANAEROBIC 10CC  Final   Culture   Final    NO GROWTH 5 DAYS Performed at [facility]    Report Status 10/06/2015 FINAL  Final  MRSA PCR Screening     Status: None   Collection Time: 10/04/15  5:02 PM  Result Value Ref Range Status   MRSA by PCR NEGATIVE NEGATIVE Final    Comment:        The GeneXpert MRSA Assay (FDA approved for NASAL specimens only), is one component of a comprehensive MRSA colonization surveillance program. It is not intended to diagnose MRSA infection nor to guide or monitor treatment for MRSA infections.          Radiology Studies: Dg Chest Port 1 View  Result Date: 10/07/2015 CLINICAL DATA:  Respiratory failure. EXAM: PORTABLE CHEST 1 VIEW COMPARISON:  10/04/2015. FINDINGS: Left subclavian line in stable position. Persistent cardiomegaly with diffuse bilateral pulmonary interstitial prominence and right pleural effusion consistent congestive heart failure. Pneumonitis cannot be excluded. Component of chronic underlying interstitial ung disease is most likely present. No pneumothorax. IMPRESSION: 1.  Left subclavian line stable position. 2. Cardiomegaly with pulmonary vascular congestion and bilateral interstitial prominence with right pleural effusion consistent congestive heart failure. Active pneumonitis cannot be excluded. A component of underlying chronic interstitial lung disease most likely present. Electronically Signed   By: Marcello Moores  Register   On: 10/07/2015 07:17    Scheduled Meds: . amitriptyline  25 mg Oral QHS  . aspirin EC  81 mg Oral Q breakfast  . atorvastatin  80 mg Oral Q breakfast  . budesonide  (PULMICORT) nebulizer solution  0.5 mg Nebulization BID  . ceFEPime (MAXIPIME) IV  1 g Intravenous Q8H  . cholecalciferol  1,000 Units Oral q morning - 10a  . enoxaparin (LOVENOX) injection  40 mg Subcutaneous Q24H  . polyethylene glycol  17 g Oral Daily  . protein supplement  2 oz Oral QID  . sodium chloride  250 mL Intravenous Once   Continuous Infusions: . sodium chloride 20 mL/hr at 10/07/15 1300  . norepinephrine (LEVOPHED) Adult infusion Stopped (10/06/15 1546)     LOS: 5 days   Time spent: > 35 minutes  Velvet Bathe, MD [facility] Pager [phone]  If 7PM-7AM, please contact night-coverage [email or web address] Password [email or web address] 10/07/2015, 1:04 PM

## 2015-10-07 NOTE — Progress Notes (Signed)
PULMONARY / CRITICAL CARE MEDICINE   Name: [name] MRN: [id number] DOB: 01/07/1936    ADMISSION DATE:  10/14/2015 CONSULTATION DATE:  10/04/15  REFERRING MD:  Dr. Wendee Beavers  CHIEF COMPLAINT:  SOB. PCCM consult for resp distress.   BRIEF:  80 y/o female with remote breast cancer and Non-Hodgkin's lymphoma admitted with Acute pharyngitis, brought to the ICU with hypotension and hypoxemia, similar to prior presentations.  Known to have falsely low blood pressure readings.   SUBJECTIVE:  Off levophed; mental status improved, complains of sore throat   VITAL SIGNS: BP (!) 102/30   Pulse (!) 101   Temp 99 F (37.2 C) (Axillary)   Resp (!) 25   Ht 5\' 5"  (1.651 m)   Wt 93 kg (205 lb 0.4 oz)   SpO2 95%   BMI 34.12 kg/m   HEMODYNAMICS:    VENTILATOR SETTINGS: FiO2 (%):  [30 %] 30 %  INTAKE / OUTPUT: I/O last 3 completed shifts: In: 1595.4 [P.O.:120; I.V.:1025.4; IV Piggyback:450] Out: 950 [Urine:950]  PHYSICAL EXAMINATION: General: resting comfortably HENT: dry mucus membranes, NCAT PULM: CTA B, normal effort CV: RRR, no mgr, 2+ radial pulse R arm GI: BS+, soft, nontender MSK: normal bulk and tone Neuro: Awake and alert, MAEW  LABS:  BMET  Recent Labs Lab 10/04/15 0440 10/05/15 0406 10/06/15 0338 10/07/15 0441  NA 135  --  133* 135  K 4.4  --  4.9 4.6  CL 106  --  107 108  CO2 22  --  19* 22  BUN 36*  --  25* 29*  CREATININE 1.61* 1.34* 0.92 1.05*  GLUCOSE 120*  --  145* 103*    Electrolytes  Recent Labs Lab 10/09/2015 2249 10/04/15 0440 10/06/15 0338 10/07/15 0441  CALCIUM  --  8.0* 7.9* 8.2*  MG 1.8  --   --   --   PHOS 4.2  --   --   --     CBC  Recent Labs Lab 10/04/15 0440 10/06/15 0338 10/07/15 0441  WBC 25.3* 27.5* 23.0*  HGB 8.7* 9.7* 8.2*  HCT 27.8* 30.5* 26.0*  PLT 334 376 296    Coag's No results for input(s): APTT, INR in the last 168 hours.  Sepsis Markers  Recent Labs Lab 09/29/2015 1941 10/04/15 1812 10/04/15 2203  10/05/15 0406 10/06/15 0338  LATICACIDVEN 1.52 1.1 1.3  --   --   PROCALCITON  --  0.52  --  0.50 0.69    ABG  Recent Labs Lab 10/05/15 0353  PHART 7.369  PCO2ART 34.6*  PO2ART 90.3    Liver Enzymes  Recent Labs Lab 09/24/2015 2249 10/07/15 0441  AST 129* 96*  ALT 135* 123*  ALKPHOS 206* 183*  BILITOT 0.5 0.7  ALBUMIN 2.1* 1.8*    Cardiac Enzymes  Recent Labs Lab 10/04/15 2203 10/05/15 0406 10/05/15 1000  TROPONINI 0.05* 0.03* 0.04*    Glucose No results for input(s): GLUCAP in the last 168 hours.  Imaging Dg Chest Port 1 View  Result Date: 10/07/2015 CLINICAL DATA:  Respiratory failure. EXAM: PORTABLE CHEST 1 VIEW COMPARISON:  10/04/2015. FINDINGS: Left subclavian line in stable position. Persistent cardiomegaly with diffuse bilateral pulmonary interstitial prominence and right pleural effusion consistent congestive heart failure. Pneumonitis cannot be excluded. Component of chronic underlying interstitial ung disease is most likely present. No pneumothorax. IMPRESSION: 1.  Left subclavian line stable position. 2. Cardiomegaly with pulmonary vascular congestion and bilateral interstitial prominence with right pleural effusion consistent congestive heart failure. Active  pneumonitis cannot be excluded. A component of underlying chronic interstitial lung disease most likely present. Electronically Signed   By: Marcello Moores  Register   On: 10/07/2015 07:17     STUDIES:  CT Chest 8/17 >> diffuse septal thickening & GGO of the lung suspicious for chronic interstitial pulmonary edema, atypical PNA consider less likely, no consolidation, possible background of reticulation of the lung peripheries may represent a component of fibrosis, small bilateral pleural effusion, small pericardial effusion, mediastinal LAN unchanged from prior PET-CT   CULTURES: Blood 8/16 >> MRSA 8/19 >> negative  ANTIBIOTICS: Cefepime 8/16 >> Vanc 8/17 >> 8/21  SIGNIFICANT EVENTS: 8/16 admit for  sepsis syndrome, low BP, mild lactic acidosis. Hypotension persisted with resp distress. Transfer to SDU. PCCM consult.   LINES/TUBES: PICC LUE 8/17 >>   DISCUSSION: 80 year old female, with remote history of breast cancer, non-Hodgkin's lymphoma, admitted for dyspnea, not feeling well, sore throat. Patient being treated as sepsis probably related to healthcare associated pneumonia. Blood pressure has been soft since admission. There is concern about further hypotension and respiratory distress in the floors on 8/19 so she was transferred to stepdown unit.  ASSESSMENT / PLAN:  PULMONARY A:  Obstructive sleep apnea Obesity hypoventilation syndrome  P:   Would wean off O2, blood gasses have proven that O2 saturations underestimate her oxygen needs Needs outpatient sleep study Would continue nightly BIPAP for now  CARDIOVASCULAR A:  Diastolic dysfunction CAD Demand ischemia Hypotension - > false cuff pressures P:  Hold off on pressors Follow mental status and UOP as markers of circulator perfusion  RENAL A:   AKI P:   Monitor BMET and UOP Replace electrolytes as needed  GASTROINTESTINAL A:   GERD P:   PPI Diet as tolerated   HEMATOLOGIC A:   Hodgkin's lymphoma, currently in remission.  Although recent imaging with lymphadenopathy which have been stable. P:  Per Hematology  INFECTIOUS A:   Acute pharyngitis (culture negative) UTI P:   Continue cefepime, though would consider narrowing Follow cultures   ENDOCRINE A:   Mild Hyperglycemia P:   Sliding scale Insulin  NEUROLOGIC A:   Anxiety issues. Depression P:   RASS goal: 0 Continue ativan prn Continue reduced dose elavil  FAMILY  - Updates:  Patient updated on plan of care 8/22  - Inter-disciplinary family meet or Palliative Care meeting due by:  8/26   PCCM will sign off  Roselie Awkward, MD [place] [facility] Pager: [phone] Cell: [phone] After 3pm or if no response, call  [phone]

## 2015-10-08 LAB — CBC
HCT: 24.9 % — ABNORMAL LOW (ref 36.0–46.0)
Hemoglobin: 7.9 g/dL — ABNORMAL LOW (ref 12.0–15.0)
MCH: 25.4 pg — ABNORMAL LOW (ref 26.0–34.0)
MCHC: 31.7 g/dL (ref 30.0–36.0)
MCV: 80.1 fL (ref 78.0–100.0)
PLATELETS: 278 10*3/uL (ref 150–400)
RBC: 3.11 MIL/uL — AB (ref 3.87–5.11)
RDW: 18.7 % — ABNORMAL HIGH (ref 11.5–15.5)
WBC: 21.1 10*3/uL — AB (ref 4.0–10.5)

## 2015-10-08 LAB — BASIC METABOLIC PANEL
ANION GAP: 5 (ref 5–15)
BUN: 36 mg/dL — ABNORMAL HIGH (ref 6–20)
CO2: 21 mmol/L — ABNORMAL LOW (ref 22–32)
Calcium: 8.2 mg/dL — ABNORMAL LOW (ref 8.9–10.3)
Chloride: 106 mmol/L (ref 101–111)
Creatinine, Ser: 1.18 mg/dL — ABNORMAL HIGH (ref 0.44–1.00)
GFR, EST AFRICAN AMERICAN: 50 mL/min — AB (ref >60)
GFR, EST NON AFRICAN AMERICAN: 43 mL/min — AB (ref >60)
Glucose, Bld: 100 mg/dL — ABNORMAL HIGH (ref 65–99)
POTASSIUM: 4.4 mmol/L (ref 3.5–5.1)
SODIUM: 132 mmol/L — AB (ref 135–145)

## 2015-10-08 MED ORDER — BOOST / RESOURCE BREEZE PO LIQD
1.0000 | Freq: Three times a day (TID) | ORAL | Status: DC
  Administered 2015-10-08 – 2015-10-09 (×3): 1 via ORAL

## 2015-10-08 NOTE — Progress Notes (Signed)
Nutrition Follow-up  DOCUMENTATION CODES:   Non-severe (moderate) malnutrition in context of acute illness/injury, Obesity unspecified  INTERVENTION:  - Recommend diet liberalization to potentially increase PO intakes.  - Will d/c Unjury. - Will order Boost Breeze po TID, each supplement provides 250 kcal and 9 grams of protein - Encourage PO intakes of meals and supplements; offer snacks between meals. - RD will continue to monitor for additional needs.  NUTRITION DIAGNOSIS:   Increased nutrient needs related to wound healing as evidenced by estimated needs. -ongoing  GOAL:   Patient will meet greater than or equal to 90% of their needs -unmet  MONITOR:   PO intake, Supplement acceptance, Weight trends, Labs, Skin, I & O's  ASSESSMENT:   [name] is a 80 y.o. woman with a remote history of breast cancer, Non-Hodgkin's lymphoma (followed by Dr. Marin Olp, patient reports recent PET scan x 2 suggestive of recurrent disease, due to see Dr. Marin Olp again in October), CAD, HTN, HLD, grade I diastolic dysfunction, and chronic debility (she was wheelchair bound even prior to being admitted to SNF after her last discharge earlier this month). During her last admission 8/2-8/5, she had severe sepsis attributed to UTI. Patient now presents with suspected pneumonia.  8/23 No documented intakes since previous assessment. No new weight since admission. Per chart review, pt lost 12 lbs (5.5% body weight) in the one month PTA which is significant for time frame. Pt states that for breakfast this AM she had 2 blueberry muffins, applesauce, and cranberry juice. Pt denies abdominal pain or nausea with eating but states that she has had a poor appetite since PTA.   Pt is very difficult to redirect during discussion and gets off-topic frequently; d/t this not much nutrition-related information was able to be obtained. Per notes, pt had acute pharyngitis PTA. During discussion, noted poor dentition.  Spoke with RN and plan is to switch supplement order from [employer] to [employer] as pt as been drinking juice well today.   Will continue to monitor for additional nutrition-related needs. Updated estimated needs this AM based on weight, age, medical presentation, wounds. Pt meets criteria for malnutrition based on weight loss and <75% needed intakes for >/= 7 days.   Medications reviewed; PRN Dulcolax, 1000 mg oral vitamin D/day, PRN Colace, 17 g Miralax/day, PRN Senokot. Labs reviewed; Na: 132 mmol/L, BUN: 36 mg/dL, creatinine: 1.18 mg/dL, Ca: 8.2 mg/dL.  IVF: NS @ 20 mL/hr.    8/17 - Attempted to speak with pt at bedside. Unable to wake. - Nutrition-Focused physical exam completed. Findings are no fat depletion, no muscle depletion, and mild edema.  - Exhibits 12#/5.5% severe wt loss in 2 weeks per chart review. - Pt wheelchair bound, slept on the couch, did not receive anything to drink for the past few days because "no one brought it to her." "slid out of wheel chair, had difficulty getting back up." - Documented PO intake 100% at breakfast - had cheerios, oatmeal, pineapple, fruit cup and cranberry juice.    Diet Order:  Diet Heart Room service appropriate? Yes; Fluid consistency: Thin  Skin:  Wound (see comment) (Stage 2 R buttocks pressure injury, DTI to R buttocks)  Last BM:  8/20  Height:   Ht Readings from Last 1 Encounters:  10/02/15 5\' 5"  (1.651 m)    Weight:   Wt Readings from Last 1 Encounters:  10/02/15 205 lb 0.4 oz (93 kg)    Ideal Body Weight:  56.8 kg  BMI:  Body mass index is 34.12 kg/m.  Estimated Nutritional Needs:   Kcal:  1900-2100  Protein:  95-110 grams  Fluid:  1.9 L/day  EDUCATION NEEDS:   No education needs identified at this time    Jarome Matin, MS, RD, LDN Inpatient Clinical Dietitian Pager # [phone] After hours/weekend pager # [phone]

## 2015-10-08 NOTE — Progress Notes (Signed)
CSW met with pt this am to assist with d/c planning. Pt reports that she plans to return to her apt at d/c. " I've used my 20 days at rehab and I can't afford co payment days. " Pt reports that she will not apply for medicaid. It's unclear why she will not apply, assistance has been offered by her lawyer. PT eval is needed when medically appropriate. CSW will continue to follow to assist with d/c planning.  Werner Lean LCSW [phone]

## 2015-10-08 NOTE — Progress Notes (Signed)
PROGRESS NOTE    [name]  [id number] DOB: Sep 05, 1935 DOA: 10/14/2015 PCP:  Melinda Crutch, MD   Brief Narrative:  80 y.o. woman with a remote history of breast cancer, Non-Hodgkin's lymphoma (followed by Dr. Marin Olp, patient reports recent PET scan x 2 suggestive of recurrent disease, due to see Dr. Marin Olp again in October), CAD, HTN, HLD, grade I diastolic dysfunction, and chronic debility she was wheelchair bound even prior to being admitted to SNF after her last discharge earlier this month.  Presents with hypotension  Assessment & Plan:   Principal Problem:   Leukocytosis, cause unclear - Left upper extremity is swollen. Will proceed with Doppler ultrasound of the upper extremities. Will pursue WBC tagged nuclear scan. Low threshold to consult ID consultant and Hemo/Onc if cause of leukocytosis remains unclear. Continue antibiotics. Procalcitonin is elevated.    - We'll continue current antibiotic regimen cefepime and vancomycin may consider narrowing antibiotics next am.   Hypotension - SBP is greater than 150mmHg today. Continue to monitor.    Active Problems:   Coronary atherosclerosis - Stable no chest pain reported    Edema - Stable   DVT prophylaxis: Lovenox Code Status: Full Family Communication: None at bedside Disposition Plan: Pending improvement in condition   Consultants:   Critical care team: signed off 8/22   Procedures: None   Antimicrobials: Vancomycin and cefepime   Subjective: Seen alongside patient's Nurse. Reports right heel pain but no skin break down.   Objective: Vitals:   10/08/15 0400 10/08/15 0600 10/08/15 0706 10/08/15 0732  BP: (!) 127/45 (!) 99/33    Pulse: (!) 104 (!) 102  (!) 103  Resp: (!) 27 (!) 25  (!) 23  Temp: 99 F (37.2 C)  99.6 F (37.6 C)   TempSrc: Oral  Axillary   SpO2: 95% 91%  90%  Weight:      Height:        Intake/Output Summary (Last 24 hours) at 10/08/15 0755 Last data filed at 10/08/15 0600  Gross per 24 hour  Intake              610 ml  Output              250 ml  Net              360 ml   Filed Weights   10/02/15 0600  Weight: 93 kg (205 lb 0.4 oz)    Examination:  General exam: Appears calm and comfortable. Morbidly obese. Bilateral Mastectomy.  Respiratory system: Clear. Cardiovascular system: S1 & S2. Gastrointestinal system: Abdomen is morbidly obese, soft and nontender. Organs are difficult to assess.  Central nervous system: Alert and oriented. Moves all limbs. Extremities: Left upper extremity edema. Leg edema.   Data Reviewed: I have personally reviewed following labs and imaging studies  CBC:  Recent Labs Lab 09/30/2015 1726 10/04/15 0440 10/06/15 0338 10/07/15 0441 10/08/15 0500  WBC 24.4* 25.3* 27.5* 23.0* 21.1*  NEUTROABS 19.9*  --   --   --   --   HGB 10.8* 8.7* 9.7* 8.2* 7.9*  HCT 33.5* 27.8* 30.5* 26.0* 24.9*  MCV 82.1 81.8 80.9 82.8 80.1  PLT 379 334 376 296 0000000   Basic Metabolic Panel:  Recent Labs Lab 09/16/2015 1726 09/29/2015 2249  10/04/15 0440 10/05/15 0406 10/06/15 0338 10/07/15 0441 10/08/15 0500  NA 136  --   --  135  --  133* 135 132*  K 4.0  --   --  4.4  --  4.9 4.6 4.4  CL 106  --   --  106  --  107 108 106  CO2 24  --   --  22  --  19* 22 21*  GLUCOSE 133*  --   --  120*  --  145* 103* 100*  BUN 14  --   --  36*  --  25* 29* 36*  CREATININE 0.69  --   < > 1.61* 1.34* 0.92 1.05* 1.18*  CALCIUM 8.3*  --   --  8.0*  --  7.9* 8.2* 8.2*  MG  --  1.8  --   --   --   --   --   --   PHOS  --  4.2  --   --   --   --   --   --   < > = values in this interval not displayed. GFR: Estimated Creatinine Clearance (by C-G formula based on SCr of 1.18 mg/dL) Female: 44.3 mL/min Female: 54.1 mL/min Liver Function Tests:  Recent Labs Lab 09/17/2015 2249 10/07/15 0441  AST 129* 96*  ALT 135* 123*  ALKPHOS 206* 183*  BILITOT 0.5 0.7  PROT 4.7* 4.5*  ALBUMIN 2.1* 1.8*   No results for input(s): LIPASE, AMYLASE in the last 168  hours. No results for input(s): AMMONIA in the last 168 hours. Coagulation Profile: No results for input(s): INR, PROTIME in the last 168 hours. Cardiac Enzymes:  Recent Labs Lab 09/19/2015 2249 10/04/15 2203 10/05/15 0406 10/05/15 1000  CKTOTAL 22*  --   --   --   TROPONINI  --  0.05* 0.03* 0.04*   BNP (last 3 results) No results for input(s): PROBNP in the last 8760 hours. HbA1C: No results for input(s): HGBA1C in the last 72 hours. CBG: No results for input(s): GLUCAP in the last 168 hours. Lipid Profile: No results for input(s): CHOL, HDL, LDLCALC, TRIG, CHOLHDL, LDLDIRECT in the last 72 hours. Thyroid Function Tests: No results for input(s): TSH, T4TOTAL, FREET4, T3FREE, THYROIDAB in the last 72 hours. Anemia Panel: No results for input(s): VITAMINB12, FOLATE, FERRITIN, TIBC, IRON, RETICCTPCT in the last 72 hours. Sepsis Labs:  Recent Labs Lab 10/10/2015 1941 10/04/15 1812 10/04/15 2203 10/05/15 0406 10/06/15 0338  PROCALCITON  --  0.52  --  0.50 0.69  LATICACIDVEN 1.52 1.1 1.3  --   --     Recent Results (from the past 240 hour(s))  Rapid strep screen     Status: None   Collection Time: 09/26/2015  4:00 PM  Result Value Ref Range Status   Streptococcus, Group A Screen (Direct) NEGATIVE NEGATIVE Final    Comment: (NOTE) A Rapid Antigen test may result negative if the antigen level in the sample is below the detection level of this test. The FDA has not cleared this test as a stand-alone test therefore the rapid antigen negative result has reflexed to a Group A Strep culture.   Culture, group A strep     Status: None   Collection Time: 09/27/2015  4:00 PM  Result Value Ref Range Status   Specimen Description THROAT  Final   Special Requests NONE Reflexed from [id number]  Final   Culture   Final    NO GROUP A STREP (S.PYOGENES) ISOLATED Performed at [facility]    Report Status 10/04/2015 FINAL  Final  Culture, blood (routine x 2)     Status: None    Collection Time: 10/02/2015  6:19 PM  Result Value Ref Range Status   Specimen Description BLOOD BLOOD LEFT ARM  Final   Special Requests BOTTLES DRAWN AEROBIC AND ANAEROBIC 10CC  Final   Culture   Final    NO GROWTH 5 DAYS Performed at [facility]    Report Status 10/06/2015 FINAL  Final  Culture, blood (routine x 2)     Status: None   Collection Time: 09/19/2015  6:19 PM  Result Value Ref Range Status   Specimen Description BLOOD BLOOD LEFT ARM  Final   Special Requests BOTTLES DRAWN AEROBIC AND ANAEROBIC 10CC  Final   Culture   Final    NO GROWTH 5 DAYS Performed at [facility]    Report Status 10/06/2015 FINAL  Final  MRSA PCR Screening     Status: None   Collection Time: 10/04/15  5:02 PM  Result Value Ref Range Status   MRSA by PCR NEGATIVE NEGATIVE Final    Comment:        The GeneXpert MRSA Assay (FDA approved for NASAL specimens only), is one component of a comprehensive MRSA colonization surveillance program. It is not intended to diagnose MRSA infection nor to guide or monitor treatment for MRSA infections.          Radiology Studies: Dg Chest Port 1 View  Result Date: 10/07/2015 CLINICAL DATA:  Respiratory failure. EXAM: PORTABLE CHEST 1 VIEW COMPARISON:  10/04/2015. FINDINGS: Left subclavian line in stable position. Persistent cardiomegaly with diffuse bilateral pulmonary interstitial prominence and right pleural effusion consistent congestive heart failure. Pneumonitis cannot be excluded. Component of chronic underlying interstitial ung disease is most likely present. No pneumothorax. IMPRESSION: 1.  Left subclavian line stable position. 2. Cardiomegaly with pulmonary vascular congestion and bilateral interstitial prominence with right pleural effusion consistent congestive heart failure. Active pneumonitis cannot be excluded. A component of underlying chronic interstitial lung disease most likely present. Electronically Signed   By: Marcello Moores   Register   On: 10/07/2015 07:17    Scheduled Meds: . amitriptyline  25 mg Oral QHS  . aspirin EC  81 mg Oral Q breakfast  . atorvastatin  80 mg Oral Q breakfast  . budesonide (PULMICORT) nebulizer solution  0.5 mg Nebulization BID  . ceFEPime (MAXIPIME) IV  1 g Intravenous Q8H  . cholecalciferol  1,000 Units Oral q morning - 10a  . enoxaparin (LOVENOX) injection  40 mg Subcutaneous Q24H  . polyethylene glycol  17 g Oral Daily  . protein supplement  2 oz Oral QID  . sodium chloride  250 mL Intravenous Once   Continuous Infusions: . sodium chloride 20 mL/hr at 10/08/15 0000  . norepinephrine (LEVOPHED) Adult infusion Stopped (10/06/15 1546)     LOS: 6 days   Time spent: > 35 minutes  Bonnell Public, MD [facility] Pager [phone]  If 7PM-7AM, please contact night-coverage [email or web address] Password [facility] 10/08/2015, 7:55 AM

## 2015-10-09 ENCOUNTER — Inpatient Hospital Stay (HOSPITAL_COMMUNITY): Payer: PPO

## 2015-10-09 DIAGNOSIS — Z853 Personal history of malignant neoplasm of breast: Secondary | ICD-10-CM

## 2015-10-09 DIAGNOSIS — Y95 Nosocomial condition: Secondary | ICD-10-CM

## 2015-10-09 DIAGNOSIS — E43 Unspecified severe protein-calorie malnutrition: Secondary | ICD-10-CM | POA: Insufficient documentation

## 2015-10-09 DIAGNOSIS — Z8572 Personal history of non-Hodgkin lymphomas: Secondary | ICD-10-CM

## 2015-10-09 DIAGNOSIS — J029 Acute pharyngitis, unspecified: Secondary | ICD-10-CM

## 2015-10-09 DIAGNOSIS — M7989 Other specified soft tissue disorders: Secondary | ICD-10-CM

## 2015-10-09 MED ORDER — ALBUMIN HUMAN 25 % IV SOLN
25.0000 g | Freq: Four times a day (QID) | INTRAVENOUS | Status: AC
  Administered 2015-10-10 (×2): 25 g via INTRAVENOUS
  Filled 2015-10-09: qty 50
  Filled 2015-10-09: qty 100
  Filled 2015-10-09: qty 50

## 2015-10-09 NOTE — Progress Notes (Signed)
Patient woke up confused this morning and pulled out Left PICC line. IV team consult placed, unable to obtain PIV. Dr. Marcelyn Ditty informed of situation and verbal order given for new PICC line placement.

## 2015-10-09 NOTE — Progress Notes (Signed)
Pt . placed on BiPAP for h/s with 3 lpm added to circuit, humidity filled, tolerating well, RN aware @ bedside

## 2015-10-09 NOTE — Progress Notes (Signed)
PROGRESS NOTE    [name]  [id number] DOB: 05-Feb-1936 DOA: 09/25/2015 PCP:  Melinda Crutch, MD   Brief Narrative:  80 y.o. woman with a remote history of breast cancer, Non-Hodgkin's lymphoma (followed by Dr. Marin Olp, patient reports recent PET scan x 2 suggestive of recurrent disease, due to see Dr. Marin Olp again in October), CAD, HTN, HLD, grade I diastolic dysfunction, and chronic debility she was wheelchair bound even prior to being admitted to SNF after her last discharge earlier this month.  Presents with hypotension  Assessment & Plan:   Principal Problem: -Leukocytosis, cause unclear - ID consult. Low threshold to Hemo/Onc, but it is mainly Neutrophilic. Continue antibiotics. Procalcitonin is elevated. Follow result of WBC tagged scan.    -Hypotension: Low albumin is noted. Will start patient on IV Albumin as this will help with effective circulatory intra-arterial volume. Will check Cortisol. Continue to monitor.  -Moderate to severe Nutrition - Dietician consult.  -Morbid Obesity  - OSA/OHS  -History of breast cancer S/P Mastectomy  -History of diffuse large cell non-Hodgkin's lymphoma  Active Problems:   Coronary atherosclerosis - Stable no chest pain reported    Edema - Stable  Overall, prognosis is guarded.  DVT prophylaxis: Lovenox Code Status: Full Family Communication:  Disposition Plan: To be determined.   Consultants:   Critical care team: signed off 8/22   Procedures: Picc Team tried to replace Picc Line today. IR's input still pending.   Antimicrobials: Cefepime   Subjective: No new complaints. No fever or chills. Piccline dislodged this morning (Needs to be replaced).   Objective: Vitals:   10/09/15 0900 10/09/15 1000 10/09/15 1100 10/09/15 1200  BP: (!) 105/51 (!) 111/38 (!) 120/46 (!) 124/53  Pulse: 100 100 (!) 108 (!) 107  Resp: 20 (!) 27 (!) 34 (!) 26  Temp:    99.1 F (37.3 C)  TempSrc:    Oral  SpO2: 97% 94% 99% 95%    Weight:      Height:        Intake/Output Summary (Last 24 hours) at 10/09/15 1249 Last data filed at 10/09/15 1221  Gross per 24 hour  Intake              500 ml  Output              500 ml  Net                0 ml   Filed Weights   10/02/15 0600  Weight: 93 kg (205 lb 0.4 oz)    Examination:  General exam: Appears calm and comfortable. Morbidly obese. Bilateral Mastectomy.  Respiratory system: Clear. Cardiovascular system: S1 & S2. Gastrointestinal system: Abdomen is morbidly obese, soft and nontender. Organs are difficult to assess.  Central nervous system: Alert and oriented. Moves all limbs. Extremities: Left upper extremity edema (Chronic). Leg edema.   Data Reviewed: I have personally reviewed following labs and imaging studies  CBC:  Recent Labs Lab 10/04/15 0440 10/06/15 0338 10/07/15 0441 10/08/15 0500  WBC 25.3* 27.5* 23.0* 21.1*  HGB 8.7* 9.7* 8.2* 7.9*  HCT 27.8* 30.5* 26.0* 24.9*  MCV 81.8 80.9 82.8 80.1  PLT 334 376 296 0000000   Basic Metabolic Panel:  Recent Labs Lab 10/04/15 0440 10/05/15 0406 10/06/15 0338 10/07/15 0441 10/08/15 0500  NA 135  --  133* 135 132*  K 4.4  --  4.9 4.6 4.4  CL 106  --  107 108 106  CO2 22  --  19* 22 21*  GLUCOSE 120*  --  145* 103* 100*  BUN 36*  --  25* 29* 36*  CREATININE 1.61* 1.34* 0.92 1.05* 1.18*  CALCIUM 8.0*  --  7.9* 8.2* 8.2*   GFR: Estimated Creatinine Clearance (by C-G formula based on SCr of 1.18 mg/dL) Female: 44.3 mL/min Female: 54.1 mL/min Liver Function Tests:  Recent Labs Lab 10/07/15 0441  AST 96*  ALT 123*  ALKPHOS 183*  BILITOT 0.7  PROT 4.5*  ALBUMIN 1.8*   No results for input(s): LIPASE, AMYLASE in the last 168 hours. No results for input(s): AMMONIA in the last 168 hours. Coagulation Profile: No results for input(s): INR, PROTIME in the last 168 hours. Cardiac Enzymes:  Recent Labs Lab 10/04/15 2203 10/05/15 0406 10/05/15 1000  TROPONINI 0.05* 0.03* 0.04*   BNP  (last 3 results) No results for input(s): PROBNP in the last 8760 hours. HbA1C: No results for input(s): HGBA1C in the last 72 hours. CBG: No results for input(s): GLUCAP in the last 168 hours. Lipid Profile: No results for input(s): CHOL, HDL, LDLCALC, TRIG, CHOLHDL, LDLDIRECT in the last 72 hours. Thyroid Function Tests: No results for input(s): TSH, T4TOTAL, FREET4, T3FREE, THYROIDAB in the last 72 hours. Anemia Panel: No results for input(s): VITAMINB12, FOLATE, FERRITIN, TIBC, IRON, RETICCTPCT in the last 72 hours. Sepsis Labs:  Recent Labs Lab 10/04/15 1812 10/04/15 2203 10/05/15 0406 10/06/15 0338  PROCALCITON 0.52  --  0.50 0.69  LATICACIDVEN 1.1 1.3  --   --     Recent Results (from the past 240 hour(s))  Rapid strep screen     Status: None   Collection Time: 09/24/2015  4:00 PM  Result Value Ref Range Status   Streptococcus, Group A Screen (Direct) NEGATIVE NEGATIVE Final    Comment: (NOTE) A Rapid Antigen test may result negative if the antigen level in the sample is below the detection level of this test. The FDA has not cleared this test as a stand-alone test therefore the rapid antigen negative result has reflexed to a Group A Strep culture.   Culture, group A strep     Status: None   Collection Time: 09/21/2015  4:00 PM  Result Value Ref Range Status   Specimen Description THROAT  Final   Special Requests NONE Reflexed from [id number]  Final   Culture   Final    NO GROUP A STREP (S.PYOGENES) ISOLATED Performed at [facility]    Report Status 10/04/2015 FINAL  Final  Culture, blood (routine x 2)     Status: None   Collection Time: 09/30/2015  6:19 PM  Result Value Ref Range Status   Specimen Description BLOOD BLOOD LEFT ARM  Final   Special Requests BOTTLES DRAWN AEROBIC AND ANAEROBIC 10CC  Final   Culture   Final    NO GROWTH 5 DAYS Performed at [facility]    Report Status 10/06/2015 FINAL  Final  Culture, blood (routine x 2)     Status:  None   Collection Time: 09/29/2015  6:19 PM  Result Value Ref Range Status   Specimen Description BLOOD BLOOD LEFT ARM  Final   Special Requests BOTTLES DRAWN AEROBIC AND ANAEROBIC 10CC  Final   Culture   Final    NO GROWTH 5 DAYS Performed at [facility]    Report Status 10/06/2015 FINAL  Final  MRSA PCR Screening     Status: None   Collection Time: 10/04/15  5:02 PM  Result Value  Ref Range Status   MRSA by PCR NEGATIVE NEGATIVE Final    Comment:        The GeneXpert MRSA Assay (FDA approved for NASAL specimens only), is one component of a comprehensive MRSA colonization surveillance program. It is not intended to diagnose MRSA infection nor to guide or monitor treatment for MRSA infections.          Radiology Studies: Dg Chest Port 1 View  Result Date: 10/09/2015 CLINICAL DATA:  Evaluate line placement. EXAM: PORTABLE CHEST 1 VIEW COMPARISON:  October 07, 2015 FINDINGS: The left-sided PICC line has been advanced in the interval. It crosses the left brachiocephalic vein and ascends in the right brachiocephalic vein with the tip pointed towards the head. Recommend repositioning. No pneumothorax. Stable cardiomediastinal silhouette. Diffuse bilateral pulmonary opacities are similar in the interval. IMPRESSION: Inappropriate PICC line placement. Recommend repositioning. See above for details. No other changes in diffuse bilateral pulmonary opacities. Electronically Signed   By: Dorise Bullion III M.D   On: 10/09/2015 11:58    Scheduled Meds: . albumin human  25 g Intravenous Q6H  . amitriptyline  25 mg Oral QHS  . aspirin EC  81 mg Oral Q breakfast  . atorvastatin  80 mg Oral Q breakfast  . budesonide (PULMICORT) nebulizer solution  0.5 mg Nebulization BID  . ceFEPime (MAXIPIME) IV  1 g Intravenous Q8H  . cholecalciferol  1,000 Units Oral q morning - 10a  . enoxaparin (LOVENOX) injection  40 mg Subcutaneous Q24H  . feeding supplement  1 Container Oral TID BM  .  polyethylene glycol  17 g Oral Daily  . sodium chloride  250 mL Intravenous Once   Continuous Infusions: . sodium chloride Stopped (10/09/15 0800)  . norepinephrine (LEVOPHED) Adult infusion Stopped (10/06/15 1546)     LOS: 7 days   Time spent: > 35 minutes  Bonnell Public, MD [facility] Pager [phone]  If 7PM-7AM, please contact night-coverage [email or web address] Password [facility] 10/09/2015, 12:49 PM

## 2015-10-09 NOTE — Progress Notes (Signed)
Patient refusing turns, care, and assessment. Unable to complete full assessment at this time. Will attempt complete assessment in 1 hour.

## 2015-10-09 NOTE — Consult Note (Signed)
[place] for Infectious Disease  Total days of antibiotics 11        Day 9 cefepime               Reason for Consult: leukocytosis in immunocompromised host    Referring Physician: ogbata  Principal Problem:   Leukocytosis Active Problems:   Coronary atherosclerosis   Edema   History of B-cell lymphoma   Abnormal chest x-ray   Cellulitis   Joint pain   Sore throat   Acute hypoxemic respiratory failure (HCC)   HCAP (healthcare-associated pneumonia)   Pressure ulcer   AKI (acute kidney injury) ([place])   Protein-calorie malnutrition, severe    HPI: [name] is a 80 y.o. female with remote hx of stage I/II lobalar ca of right breast, ER+/HER2 -, and diffuse large cell NHL-treated with 6 cycples of rituxan/bendamustine but with possible relapse since recent PET scan showed activity with abdominal and bilateral hilar lymph nodes. She also carries dx of CAD s/p PCI, obesity ,adn OSA,wheelchair bound and SNF resident.  she was admitted in July for HCAP with levofloxacin. She was admitted on 8/3 for fevers, but no conclusive infectious source, treated with 7 day course of doxy. She was readmitted on 8/16 for diffuse myalgias, arthralgias as well as sore throat. She was found to have WBC of 24, but did not appear septic. CXR showed vascular congestion vs infiltrate. She was started on vanco and cefepime. Her exam showed possibly right hand cellulitis. Her abtx that were started were to cover cellulitis/vs hcap. She is now day 9 of cefepime. Infectious work up of blood cx are all negative. She remains afebrile. Last wbc was 21K on 8/23 but trending down from the 21st when WBC was 27.5K  Past Medical History:  Diagnosis Date  . Anxiety    takes Ativan daily  . Bilateral carotid artery stenosis    per last duplex --  RICA and LICA  69-79-%  . CAD (coronary artery disease)    cardiologist-  dr Stanford Breed  . Chronic chest pain   . Chronic headache   . Depression    takes Elavil  nightly  . Diffuse large cell non-Hodgkin's lymphoma [facility]) oncologist-  dr Marin Olp-  per last office note being monitored for possible recurrence   dx 03/ 2015 by lymph node bx/  Stage 3-  completed chemotherapy  . Dyspnea   . GERD (gastroesophageal reflux disease)    takes Nexium daily  . Hiatal hernia   . History of breast cancer oncologist-  dr Marin Olp--  no recurrence   dx 2002-- right breast Stage 1/2 lobular,  ER+/HER2 negative /  s/p  right mastectomy w/ node dissection and chemotherapy  . History of colon polyps    hyperplastic polyp's  2003;  2006;  2011  . History of colon polyps    hyperplastic 2003,  2006,  2011  . History of panic attacks   . History of small bowel obstruction    08/ 2014  parital sbo  resolved without surgical intervention  . Hyperlipidemia    takes Atorvastatin daily  . Hypertension    takes Quinapril daily  . Low back pain   . OSA (obstructive sleep apnea)    mild per study 10-14-2007-  cpap intolerent, uses O2 supplement at night  . Osteoarthritis   . PMB (postmenopausal bleeding)   . S/P drug eluting coronary stent placement    pLAD 07-09-2009  . Thickened endometrium  Allergies: No Known Allergies  MEDICATIONS: . albumin human  25 g Intravenous Q6H  . amitriptyline  25 mg Oral QHS  . aspirin EC  81 mg Oral Q breakfast  . atorvastatin  80 mg Oral Q breakfast  . budesonide (PULMICORT) nebulizer solution  0.5 mg Nebulization BID  . ceFEPime (MAXIPIME) IV  1 g Intravenous Q8H  . cholecalciferol  1,000 Units Oral q morning - 10a  . enoxaparin (LOVENOX) injection  40 mg Subcutaneous Q24H  . feeding supplement  1 Container Oral TID BM  . polyethylene glycol  17 g Oral Daily  . sodium chloride  250 mL Intravenous Once    Social History  Substance Use Topics  . Smoking status: Never Smoker  . Smokeless tobacco: Never Used  . Alcohol use No    Family hx: no history of lymphoma in the family  Review of Systems  Constitutional:  Negative for fever, chills, diaphoresis, activity change, appetite change, fatigue and unexpected weight change.  HENT: + sore throat. Negative for congestion,  rhinorrhea, sneezing, trouble swallowing and sinus pressure.  Eyes: Negative for photophobia and visual disturbance.  Respiratory: Negative for cough, chest tightness, shortness of breath, wheezing and stridor.  Cardiovascular: Negative for chest pain, palpitations and leg swelling.  Gastrointestinal: Negative for nausea, vomiting, abdominal pain, diarrhea, constipation, blood in stool, abdominal distention and anal bleeding.  Genitourinary: Negative for dysuria, hematuria, flank pain and difficulty urinating.  Musculoskeletal: left arm swelling+ Skin: Negative for color change, pallor, rash and wound.  Neurological: + headache to right temple. Negative for dizziness, tremors, weakness and light-headedness.  Hematological: Negative for adenopathy. Does not bruise/bleed easily.  Psychiatric/Behavioral: Negative for behavioral problems, confusion, sleep disturbance, dysphoric mood, decreased concentration and agitation.    OBJECTIVE: Temp:  [98.6 F (37 C)-99.3 F (37.4 C)] 99.1 F (37.3 C) (08/24 1200) Pulse Rate:  [98-111] 105 (08/24 1500) Resp:  [20-34] 28 (08/24 1500) BP: (85-133)/(35-72) 125/51 (08/24 1500) SpO2:  [90 %-99 %] 96 % (08/24 1500) Physical Exam  Constitutional:  oriented to person, place,only.Marland Kitchen appears slightly disheveled and well-nourished. No distress.  HENT: [place]/AT, PERRLA, no scleral icterus Mouth/Throat: Oropharynx is clear and dry. No oropharyngeal exudate. Poor dentition Cardiovascular: tachy, regular rhythm and normal heart sounds. Exam reveals no gallop and no friction rub.  No murmur heard.  Pulmonary/Chest: Effort normal and breath sounds normal. No respiratory distress.  has no wheezes.  Neck = supple, no nuchal rigidity Abdominal: Soft. Bowel sounds are normal.  exhibits no distension. There is no  tenderness.  Lymphadenopathy: no cervical adenopathy. No axillary adenopathy Ext: right arm swelling, + anasarca, as well 1+ edema to LE bilaterally Skin: Skin is warm and dry. No rash noted. No erythema. Left UE picc line in place. Onychomycosis to toe nails. Psychiatric: a normal mood and affect.  behavior is normal.   LABS: Results for orders placed or performed during the hospital encounter of 10/12/2015 (from the past 48 hour(s))  CBC     Status: Abnormal   Collection Time: 10/08/15  5:00 AM  Result Value Ref Range   WBC 21.1 (H) 4.0 - 10.5 K/uL   RBC 3.11 (L) 3.87 - 5.11 MIL/uL   Hemoglobin 7.9 (L) 12.0 - 15.0 g/dL   HCT 24.9 (L) 36.0 - 46.0 %   MCV 80.1 78.0 - 100.0 fL   MCH 25.4 (L) 26.0 - 34.0 pg   MCHC 31.7 30.0 - 36.0 g/dL   RDW 18.7 (H) 11.5 - 15.5 %  Platelets 278 150 - 400 K/uL  Basic metabolic panel     Status: Abnormal   Collection Time: 10/08/15  5:00 AM  Result Value Ref Range   Sodium 132 (L) 135 - 145 mmol/L   Potassium 4.4 3.5 - 5.1 mmol/L   Chloride 106 101 - 111 mmol/L   CO2 21 (L) 22 - 32 mmol/L   Glucose, Bld 100 (H) 65 - 99 mg/dL   BUN 36 (H) 6 - 20 mg/dL   Creatinine, Ser 1.18 (H) 0.44 - 1.00 mg/dL   Calcium 8.2 (L) 8.9 - 10.3 mg/dL   GFR calc non Af Amer 43 (L) >60 mL/min   GFR calc Af Amer 50 (L) >60 mL/min    Comment: (NOTE) The eGFR has been calculated using the CKD EPI equation. This calculation has not been validated in all clinical situations. eGFR's persistently <60 mL/min signify possible Chronic Kidney Disease.    Anion gap 5 5 - 15   *Note: Due to a large number of results and/or encounters for the requested time period, some results have not been displayed. A complete set of results can be found in Results Review.    MICRO: 8/16 blood cx ngtd  8/16 throat no group a strep  IMAGING: Dg Chest Port 1 View  Result Date: 10/09/2015 CLINICAL DATA:  Evaluate line placement. EXAM: PORTABLE CHEST 1 VIEW COMPARISON:  October 07, 2015  FINDINGS: The left-sided PICC line has been advanced in the interval. It crosses the left brachiocephalic vein and ascends in the right brachiocephalic vein with the tip pointed towards the head. Recommend repositioning. No pneumothorax. Stable cardiomediastinal silhouette. Diffuse bilateral pulmonary opacities are similar in the interval. IMPRESSION: Inappropriate PICC line placement. Recommend repositioning. See above for details. No other changes in diffuse bilateral pulmonary opacities. Electronically Signed   By: Dorise Bullion III M.D   On: 10/09/2015 11:58   Assessment/Plan:  80yo F with history of NHL, remote breast ca admitted for myalgias, arthralgias and sore throat which could be consistent with viral infection. She was started on empiric regimen for hcap. Currently day 9 of abtx. Leukocytosis improving.   hcap = difficult to tell if infiltrates on chest xray are all related to fluid overload. She appears improving. Would finish one more day of cefepine and complete 10d course of treatment.  Sore throat = oral exam appears dry, no signs of thrush, though limited exam. Consider doing empiric nystatin swish/swallow to see if that helps. If no improvement, may consider getting GI to do EGD. She did recently finish a course of doxycycline which is known to cause pill esophagitis  Leukocytosis =appears improving. Recommend to repeat cbc with diff tomorrow. She is scheduled for a tagged wbc scan which maybe difficult to interpret given that her PET scan 4 wk ago has some uptake.  Left arm swelling = unclear if this is her baseline from her prior breast cancer/treatment. Ultrasound of arm on 8/16 ruled out DVT  NHL possible recurrence =followed by oncology for further testing in October   Tyera Hansley B. [place] for Infectious Diseases [phone]

## 2015-10-09 NOTE — Consult Note (Signed)
   [facility] CM Inpatient Consult   10/09/2015  Fox Dally Lavery 12/18/1935 [id number]   [facility] Care Management follow up. Chart reviewed. Went to patient's room.  However, sign on door stating "sterile procedure in process". Will come back at a more appropriate time to discuss [place], MSN-Ed, RN,BSN [facility] Liaison [phone]

## 2015-10-09 NOTE — Consult Note (Addendum)
   [facility] CM Inpatient Consult   10/09/2015  [name] 1935-11-30 [id number]   Went to bedside again. Patient is sleeping soundly. Spoke with inpatient RNCM to make aware that writer is attempting to engage for [place] Management program. Will continue to follow. Of note, patient remains in stepdown unit.  Marthenia Rolling, MSN-Ed, RN,BSN [facility] Liaison [phone]

## 2015-10-09 NOTE — Progress Notes (Signed)
Dr. Marthenia Rolling informed of IV access issues and updated about PICC line. Intervention Radiology department unable to reposition PICC line today, patient scheduled for tomorrow morning 10/11/15 @ 0930 am for repositioning of line. Dr. Marthenia Rolling updated and made aware that patient currently has no IV access to give antibiotics and albumin.

## 2015-10-09 NOTE — Progress Notes (Signed)
Left upper arm PICC line placed by PICC line nurse at bedside. Per PICC RN, unable to properly position catheter (tip pointed upward). Dr. Marthenia Rolling informed and order given for IR to reposition left PICC line.
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
## 2015-10-10 ENCOUNTER — Inpatient Hospital Stay (HOSPITAL_COMMUNITY): Payer: PPO

## 2015-10-10 ENCOUNTER — Encounter (HOSPITAL_COMMUNITY): Payer: Self-pay | Admitting: Radiology

## 2015-10-10 DIAGNOSIS — R938 Abnormal findings on diagnostic imaging of other specified body structures: Secondary | ICD-10-CM

## 2015-10-10 DIAGNOSIS — R609 Edema, unspecified: Secondary | ICD-10-CM

## 2015-10-10 HISTORY — PX: IR GENERIC HISTORICAL: IMG1180011

## 2015-10-10 LAB — COMPREHENSIVE METABOLIC PANEL
ALK PHOS: 226 U/L — AB (ref 38–126)
ALT: 138 U/L — AB (ref 14–54)
ANION GAP: 7 (ref 5–15)
AST: 158 U/L — ABNORMAL HIGH (ref 15–41)
Albumin: 1.8 g/dL — ABNORMAL LOW (ref 3.5–5.0)
BILIRUBIN TOTAL: 0.4 mg/dL (ref 0.3–1.2)
BUN: 47 mg/dL — ABNORMAL HIGH (ref 6–20)
CALCIUM: 8.1 mg/dL — AB (ref 8.9–10.3)
CO2: 19 mmol/L — AB (ref 22–32)
CREATININE: 1.69 mg/dL — AB (ref 0.44–1.00)
Chloride: 106 mmol/L (ref 101–111)
GFR, EST AFRICAN AMERICAN: 32 mL/min — AB (ref >60)
GFR, EST NON AFRICAN AMERICAN: 28 mL/min — AB (ref >60)
Glucose, Bld: 103 mg/dL — ABNORMAL HIGH (ref 65–99)
Potassium: 4.7 mmol/L (ref 3.5–5.1)
SODIUM: 132 mmol/L — AB (ref 135–145)
TOTAL PROTEIN: 4.8 g/dL — AB (ref 6.5–8.1)

## 2015-10-10 LAB — CBC WITH DIFFERENTIAL/PLATELET
Basophils Absolute: 0 10*3/uL (ref 0.0–0.1)
Basophils Relative: 0 %
EOS ABS: 1.1 10*3/uL — AB (ref 0.0–0.7)
Eosinophils Relative: 5 %
HEMATOCRIT: 24.9 % — AB (ref 36.0–46.0)
HEMOGLOBIN: 8.2 g/dL — AB (ref 12.0–15.0)
LYMPHS ABS: 0.9 10*3/uL (ref 0.7–4.0)
LYMPHS PCT: 4 %
MCH: 26.4 pg (ref 26.0–34.0)
MCHC: 32.9 g/dL (ref 30.0–36.0)
MCV: 80.1 fL (ref 78.0–100.0)
MONOS PCT: 6 %
Monocytes Absolute: 1.3 10*3/uL — ABNORMAL HIGH (ref 0.1–1.0)
NEUTROS PCT: 85 %
Neutro Abs: 19.6 10*3/uL — ABNORMAL HIGH (ref 1.7–7.7)
Platelets: 274 10*3/uL (ref 150–400)
RBC: 3.11 MIL/uL — AB (ref 3.87–5.11)
RDW: 19 % — ABNORMAL HIGH (ref 11.5–15.5)
WBC: 23 10*3/uL — AB (ref 4.0–10.5)

## 2015-10-10 MED ORDER — VANCOMYCIN HCL 10 G IV SOLR
1250.0000 mg | INTRAVENOUS | Status: DC
  Administered 2015-10-10 – 2015-10-11 (×2): 1250 mg via INTRAVENOUS
  Filled 2015-10-10 (×3): qty 1250

## 2015-10-10 NOTE — Progress Notes (Signed)
PROGRESS NOTE    [name]  [id number] DOB: 1935/11/08 DOA: 10/07/2015 PCP:  Melinda Crutch, MD   Brief Narrative:  80 y.o. woman with a remote history of breast cancer, Non-Hodgkin's lymphoma (followed by Dr. Marin Olp, patient reports recent PET scan x 2 suggestive of recurrent disease, due to see Dr. Marin Olp again in October), CAD, HTN, HLD, grade I diastolic dysfunction, and chronic debility she was wheelchair bound even prior to being admitted to SNF after her last discharge earlier this month.  Presents with hypotension  Assessment & Plan:   Principal Problem: -Leukocytosis, cause unclear - ID consultant input is appreciated. Now having a fever. Will send blood for cultures. Follow result of WBC tagged scan. Add IV Vancomycin and will low threshold to repeat CT chest. Low threshold to Hemo/Onc, but it is mainly Neutrophilic. Continue antibiotics. Procalcitonin is elevated.   -Hypotension: Resolving. Low albumin is noted. Will start patient on IV Albumin as this will help with effective circulatory intra-arterial volume. Cortisol done on 11.7 (patient may have relative adrenal insufficiency. Consider further work up if hypotension persists). However, BP is better today. Continue to monitor.  -Moderate to severe Nutrition - Dietician consult.  -Morbid Obesity  - OSA/OHS  -History of breast cancer S/P Mastectomy  -History of diffuse large cell non-Hodgkin's lymphoma  Active Problems:   Coronary atherosclerosis - Stable no chest pain reported    Edema - Stable  Overall, prognosis is guarded.  DVT prophylaxis: Lovenox Code Status: Full Family Communication:  Disposition Plan: To be determined.   Consultants:   Critical care team: signed off 8/22   Procedures: Picc Team tried to replace Picc Line today. IR's input still pending.   Antimicrobials: Cefepime. Will add IV Vancomycin   Subjective: No new complaints. Now having fever. Piccline was properly placed  earlier today by IR.   Objective: Vitals:   10/10/15 0700 10/10/15 0800 10/10/15 0813 10/10/15 0900  BP:      Pulse: (!) 110 (!) 104  (!) 108  Resp: (!) 35 (!) 32  (!) 32  Temp:  100.1 F (37.8 C)    TempSrc:  Oral    SpO2: 96% 97% 96% 94%  Weight:      Height:        Intake/Output Summary (Last 24 hours) at 10/10/15 1059 Last data filed at 10/10/15 0916  Gross per 24 hour  Intake              490 ml  Output              750 ml  Net             -260 ml   Filed Weights   10/02/15 0600  Weight: 93 kg (205 lb 0.4 oz)    Examination:  General exam: Appears calm and comfortable. Morbidly obese. Bilateral Mastectomy.  Respiratory system: Clear. Cardiovascular system: S1 & S2. Gastrointestinal system: Abdomen is morbidly obese, soft and nontender. Organs are difficult to assess.  Central nervous system: Alert and oriented. Moves all limbs. Extremities: Left upper extremity edema (Chronic). Leg edema.   Data Reviewed: I have personally reviewed following labs and imaging studies  CBC:  Recent Labs Lab 10/04/15 0440 10/06/15 0338 10/07/15 0441 10/08/15 0500  WBC 25.3* 27.5* 23.0* 21.1*  HGB 8.7* 9.7* 8.2* 7.9*  HCT 27.8* 30.5* 26.0* 24.9*  MCV 81.8 80.9 82.8 80.1  PLT 334 376 296 0000000   Basic Metabolic Panel:  Recent Labs Lab 10/04/15 0440 10/05/15  0406 10/06/15 0338 10/07/15 0441 10/08/15 0500  NA 135  --  133* 135 132*  K 4.4  --  4.9 4.6 4.4  CL 106  --  107 108 106  CO2 22  --  19* 22 21*  GLUCOSE 120*  --  145* 103* 100*  BUN 36*  --  25* 29* 36*  CREATININE 1.61* 1.34* 0.92 1.05* 1.18*  CALCIUM 8.0*  --  7.9* 8.2* 8.2*   GFR: Estimated Creatinine Clearance (by C-G formula based on SCr of 1.18 mg/dL) Female: 44.3 mL/min Female: 54.1 mL/min Liver Function Tests:  Recent Labs Lab 10/07/15 0441  AST 96*  ALT 123*  ALKPHOS 183*  BILITOT 0.7  PROT 4.5*  ALBUMIN 1.8*   No results for input(s): LIPASE, AMYLASE in the last 168 hours. No results  for input(s): AMMONIA in the last 168 hours. Coagulation Profile: No results for input(s): INR, PROTIME in the last 168 hours. Cardiac Enzymes:  Recent Labs Lab 10/04/15 2203 10/05/15 0406 10/05/15 1000  TROPONINI 0.05* 0.03* 0.04*   BNP (last 3 results) No results for input(s): PROBNP in the last 8760 hours. HbA1C: No results for input(s): HGBA1C in the last 72 hours. CBG: No results for input(s): GLUCAP in the last 168 hours. Lipid Profile: No results for input(s): CHOL, HDL, LDLCALC, TRIG, CHOLHDL, LDLDIRECT in the last 72 hours. Thyroid Function Tests: No results for input(s): TSH, T4TOTAL, FREET4, T3FREE, THYROIDAB in the last 72 hours. Anemia Panel: No results for input(s): VITAMINB12, FOLATE, FERRITIN, TIBC, IRON, RETICCTPCT in the last 72 hours. Sepsis Labs:  Recent Labs Lab 10/04/15 1812 10/04/15 2203 10/05/15 0406 10/06/15 0338  PROCALCITON 0.52  --  0.50 0.69  LATICACIDVEN 1.1 1.3  --   --     Recent Results (from the past 240 hour(s))  Rapid strep screen     Status: None   Collection Time: 09/28/2015  4:00 PM  Result Value Ref Range Status   Streptococcus, Group A Screen (Direct) NEGATIVE NEGATIVE Final    Comment: (NOTE) A Rapid Antigen test may result negative if the antigen level in the sample is below the detection level of this test. The FDA has not cleared this test as a stand-alone test therefore the rapid antigen negative result has reflexed to a Group A Strep culture.   Culture, group A strep     Status: None   Collection Time: 10/05/2015  4:00 PM  Result Value Ref Range Status   Specimen Description THROAT  Final   Special Requests NONE Reflexed from [id number]  Final   Culture   Final    NO GROUP A STREP (S.PYOGENES) ISOLATED Performed at [facility]    Report Status 10/04/2015 FINAL  Final  Culture, blood (routine x 2)     Status: None   Collection Time: 09/22/2015  6:19 PM  Result Value Ref Range Status   Specimen Description BLOOD  BLOOD LEFT ARM  Final   Special Requests BOTTLES DRAWN AEROBIC AND ANAEROBIC 10CC  Final   Culture   Final    NO GROWTH 5 DAYS Performed at [facility]    Report Status 10/06/2015 FINAL  Final  Culture, blood (routine x 2)     Status: None   Collection Time: 09/24/2015  6:19 PM  Result Value Ref Range Status   Specimen Description BLOOD BLOOD LEFT ARM  Final   Special Requests BOTTLES DRAWN AEROBIC AND ANAEROBIC 10CC  Final   Culture   Final  NO GROWTH 5 DAYS Performed at [facility]    Report Status 10/06/2015 FINAL  Final  MRSA PCR Screening     Status: None   Collection Time: 10/04/15  5:02 PM  Result Value Ref Range Status   MRSA by PCR NEGATIVE NEGATIVE Final    Comment:        The GeneXpert MRSA Assay (FDA approved for NASAL specimens only), is one component of a comprehensive MRSA colonization surveillance program. It is not intended to diagnose MRSA infection nor to guide or monitor treatment for MRSA infections.          Radiology Studies: Dg Chest Port 1 View  Result Date: 10/09/2015 CLINICAL DATA:  Evaluate line placement. EXAM: PORTABLE CHEST 1 VIEW COMPARISON:  October 07, 2015 FINDINGS: The left-sided PICC line has been advanced in the interval. It crosses the left brachiocephalic vein and ascends in the right brachiocephalic vein with the tip pointed towards the head. Recommend repositioning. No pneumothorax. Stable cardiomediastinal silhouette. Diffuse bilateral pulmonary opacities are similar in the interval. IMPRESSION: Inappropriate PICC line placement. Recommend repositioning. See above for details. No other changes in diffuse bilateral pulmonary opacities. Electronically Signed   By: Dorise Bullion III M.D   On: 10/09/2015 11:58    Scheduled Meds: . albumin human  25 g Intravenous Q6H  . amitriptyline  25 mg Oral QHS  . aspirin EC  81 mg Oral Q breakfast  . atorvastatin  80 mg Oral Q breakfast  . budesonide (PULMICORT) nebulizer  solution  0.5 mg Nebulization BID  . ceFEPime (MAXIPIME) IV  1 g Intravenous Q8H  . cholecalciferol  1,000 Units Oral q morning - 10a  . enoxaparin (LOVENOX) injection  40 mg Subcutaneous Q24H  . feeding supplement  1 Container Oral TID BM  . polyethylene glycol  17 g Oral Daily  . sodium chloride  250 mL Intravenous Once   Continuous Infusions: . sodium chloride Stopped (10/09/15 0800)  . norepinephrine (LEVOPHED) Adult infusion Stopped (10/06/15 1546)     LOS: 8 days   Time spent: > 35 minutes  Bonnell Public, MD [facility] Pager [phone]  If 7PM-7AM, please contact night-coverage [email or web address] Password [facility] 10/10/2015, 10:59 AM

## 2015-10-10 NOTE — Progress Notes (Signed)
Pharmacy Antibiotic Note  [name] is a 80 y.o. female admitted on 10/11/2015 with suspected HCAP.  Pharmacy has been following for Vancomycin dosing and antibiotic renal dose adjustment.  Today, 10/10/2015:  Per ID, to complete cefepime course today, but with new low grade fever and continued leukocytosis, MD wishes to continue antibiotics and resume vancomycin.    Note previous AKI had resolved, but now SCr elevated to previous peak value. Original orders for vancomycin 1250 mg q12 hr reduced to 750 mg q12 hr based on two levels.  Plan:  Resume Vancomycin 1250 mg IV q24hr; goal trough 15-20 mcg/mL  Measure vancomycin trough levels at steady state as indicated  Continue cefepime as ordered   Height: 5\' 5"  (165.1 cm) Weight: 205 lb 0.4 oz (93 kg) IBW/kg (Calculated) : 57  Last weight documented as 93 kg (09/18/15)  Temp (24hrs), Avg:99.4 F (37.4 C), Min:98.6 F (37 C), Max:100.4 F (38 C)   Recent Labs Lab 10/03/15 2130 10/04/15 0440 10/04/15 1000 10/04/15 1812 10/04/15 2203 10/05/15 0406 10/06/15 0338 10/07/15 0441 10/08/15 0500 10/10/15 1035  WBC  --  25.3*  --   --   --   --  27.5* 23.0* 21.1* 23.0*  CREATININE 1.62* 1.61*  --   --   --  1.34* 0.92 1.05* 1.18* 1.69*  LATICACIDVEN  --   --   --  1.1 1.3  --   --   --   --   --   VANCOTROUGH 36*  --   --   --   --   --   --   --   --   --   VANCORANDOM  --   --  21  --   --   --   --   --   --   --     Estimated Creatinine Clearance: 30.9 mL/min (by C-G formula based on SCr of 1.69 mg/dL).    No Known Allergies  Antimicrobials this admission: 8/16 Cefepime >>  8/16 Vancomycin >> 8/21, resume 8/25 >>  Dose adjustments this admission: 8/18 VT= 36 on 1250mg  q12hr prior doses charted correctly. 8/19 VR @1000  = 27  Microbiology results: 8/16 BCx: NGF 8/16 Rapid strep screen: neg 8/16 Group A strep cxt: neg 8/19 MRSA PCR: neg 8/25 BCx: sent  Thank you for allowing pharmacy to be a part of this patient's  care.  Reuel Boom, PharmD, BCPS Pager: [phone] 10/10/2015, 5:15 PM

## 2015-10-10 NOTE — Progress Notes (Signed)
[place] for Infectious Disease    Date of Admission:  09/30/2015   Total days of antibiotics 12        Day 10 cefepime           ID: [name] is a 80 y.o. female with bcell lymphoma, with leukocytosis Principal Problem:   Leukocytosis Active Problems:   Coronary atherosclerosis   Edema   History of B-cell lymphoma   Abnormal chest x-ray   Cellulitis   Joint pain   Sore throat   Acute hypoxemic respiratory failure (HCC)   HCAP (healthcare-associated pneumonia)   Pressure ulcer   AKI (acute kidney injury) ([place])   Protein-calorie malnutrition, severe    Subjective: Fever of 100.4 today.   Wbc increased from 21 to 23K,  Medications:  . albumin human  25 g Intravenous Q6H  . amitriptyline  25 mg Oral QHS  . aspirin EC  81 mg Oral Q breakfast  . atorvastatin  80 mg Oral Q breakfast  . budesonide (PULMICORT) nebulizer solution  0.5 mg Nebulization BID  . ceFEPime (MAXIPIME) IV  1 g Intravenous Q8H  . cholecalciferol  1,000 Units Oral q morning - 10a  . enoxaparin (LOVENOX) injection  40 mg Subcutaneous Q24H  . feeding supplement  1 Container Oral TID BM  . polyethylene glycol  17 g Oral Daily  . sodium chloride  250 mL Intravenous Once    Objective: Vital signs in last 24 hours: Temp:  [98.6 F (37 C)-100.1 F (37.8 C)] 100.1 F (37.8 C) (08/25 0800) Pulse Rate:  [101-110] 108 (08/25 0900) Resp:  [23-35] 32 (08/25 0900) BP: (114-134)/(46-59) 119/59 (08/25 0500) SpO2:  [92 %-97 %] 94 % (08/25 0900) Physical Exam  Constitutional:  oriented to person, place, and time, easily awakens. appears well-developed and well-nourished. No distress.  HENT: [place]/AT, PERRLA, no scleral icterus Mouth/Throat: Oropharynx is clear and moist. No oropharyngeal exudate.  Cardiovascular: Normal rate, regular rhythm and normal heart sounds. Exam reveals no gallop and no friction rub.  No murmur heard.  Pulmonary/Chest: Effort normal and breath sounds normal. No respiratory  distress.  has no wheezes.  Neck = supple, no nuchal rigidity Abdominal: Soft. Bowel sounds are normal.  exhibits no distension. There is no tenderness.  Lymphadenopathy: no cervical adenopathy. No axillary adenopathy Neurological: alert and oriented to person, place, and time.  Ext: right arm edema, picc line in right arm Skin: Skin is warm and dry. No rash noted. No erythema.  Psychiatric: a normal mood and affect.  behavior is normal.    Lab Results  Recent Labs  10/08/15 0500 10/10/15 1035  WBC 21.1* 23.0*  HGB 7.9* 8.2*  HCT 24.9* 24.9*  NA 132* 132*  K 4.4 4.7  CL 106 106  CO2 21* 19*  BUN 36* 47*  CREATININE 1.18* 1.69*   Liver Panel  Recent Labs  10/10/15 1035  PROT 4.8*  ALBUMIN 1.8*  AST 158*  ALT 138*  ALKPHOS 226*  BILITOT 0.4   Microbiology: 8/16 blood cx ngtd Studies/Results: Dg Chest Port 1 View  Result Date: 10/09/2015 CLINICAL DATA:  Evaluate line placement. EXAM: PORTABLE CHEST 1 VIEW COMPARISON:  October 07, 2015 FINDINGS: The left-sided PICC line has been advanced in the interval. It crosses the left brachiocephalic vein and ascends in the right brachiocephalic vein with the tip pointed towards the head. Recommend repositioning. No pneumothorax. Stable cardiomediastinal silhouette. Diffuse bilateral pulmonary opacities are similar in the interval. IMPRESSION: Inappropriate PICC line  placement. Recommend repositioning. See above for details. No other changes in diffuse bilateral pulmonary opacities. Electronically Signed   By: Dorise Bullion III M.D   On: 10/09/2015 11:58     Assessment/Plan: Fevers and leukocytosis = patient is on day 10 of abtx. Recent cxr shows bilateraly infiltrate probably more related to congestion. Recommend check BNP. Discussed with dr. Marthenia Rolling that it would be fine to do 48hr of vanco and cefepime and narrow abtx based upon repeat blood cultures. Consider RT to get sputum cx.  transaminitis = recommend to repeat AST/ALT.  Possibly due to congestive hepatopathy.  If no other + cultures, consider stopping abtx, watch off of abtx.  Dr hatcher available for questions over the weekend, will see back on monday   Santia Labate, [facility] for Infectious Diseases Cell: [phone] Pager: [phone]  10/10/2015, 1:45 PM

## 2015-10-11 ENCOUNTER — Inpatient Hospital Stay (HOSPITAL_COMMUNITY): Payer: PPO

## 2015-10-11 DIAGNOSIS — R609 Edema, unspecified: Secondary | ICD-10-CM

## 2015-10-11 DIAGNOSIS — I10 Essential (primary) hypertension: Secondary | ICD-10-CM

## 2015-10-11 DIAGNOSIS — C50919 Malignant neoplasm of unspecified site of unspecified female breast: Secondary | ICD-10-CM

## 2015-10-11 DIAGNOSIS — Z95828 Presence of other vascular implants and grafts: Secondary | ICD-10-CM

## 2015-10-11 DIAGNOSIS — R509 Fever, unspecified: Secondary | ICD-10-CM

## 2015-10-11 DIAGNOSIS — E8770 Fluid overload, unspecified: Secondary | ICD-10-CM

## 2015-10-11 DIAGNOSIS — R0682 Tachypnea, not elsewhere classified: Secondary | ICD-10-CM

## 2015-10-11 DIAGNOSIS — I2510 Atherosclerotic heart disease of native coronary artery without angina pectoris: Secondary | ICD-10-CM

## 2015-10-11 DIAGNOSIS — R7989 Other specified abnormal findings of blood chemistry: Secondary | ICD-10-CM

## 2015-10-11 DIAGNOSIS — R000 Tachycardia, unspecified: Secondary | ICD-10-CM

## 2015-10-11 DIAGNOSIS — I951 Orthostatic hypotension: Secondary | ICD-10-CM

## 2015-10-11 DIAGNOSIS — R601 Generalized edema: Secondary | ICD-10-CM

## 2015-10-11 LAB — CBC WITH DIFFERENTIAL/PLATELET
Basophils Absolute: 0 10*3/uL (ref 0.0–0.1)
Basophils Relative: 0 %
Eosinophils Absolute: 0.5 10*3/uL (ref 0.0–0.7)
Eosinophils Relative: 2 %
HCT: 22.7 % — ABNORMAL LOW (ref 36.0–46.0)
Hemoglobin: 7.4 g/dL — ABNORMAL LOW (ref 12.0–15.0)
Lymphocytes Relative: 3 %
Lymphs Abs: 0.8 10*3/uL (ref 0.7–4.0)
MCH: 25.5 pg — ABNORMAL LOW (ref 26.0–34.0)
MCHC: 32.6 g/dL (ref 30.0–36.0)
MCV: 78.3 fL (ref 78.0–100.0)
Monocytes Absolute: 1.1 10*3/uL — ABNORMAL HIGH (ref 0.1–1.0)
Monocytes Relative: 4 %
Neutro Abs: 23.9 10*3/uL — ABNORMAL HIGH (ref 1.7–7.7)
Neutrophils Relative %: 91 %
Platelets: 240 10*3/uL (ref 150–400)
RBC: 2.9 MIL/uL — ABNORMAL LOW (ref 3.87–5.11)
RDW: 18.7 % — ABNORMAL HIGH (ref 11.5–15.5)
WBC: 26.3 10*3/uL — ABNORMAL HIGH (ref 4.0–10.5)

## 2015-10-11 LAB — URINALYSIS, ROUTINE W REFLEX MICROSCOPIC
Glucose, UA: NEGATIVE mg/dL
Ketones, ur: NEGATIVE mg/dL
Nitrite: NEGATIVE
Protein, ur: 100 mg/dL — AB
Specific Gravity, Urine: 1.022 (ref 1.005–1.030)
pH: 5.5 (ref 5.0–8.0)

## 2015-10-11 LAB — BLOOD GAS, ARTERIAL
Acid-base deficit: 9.7 mmol/L — ABNORMAL HIGH (ref 0.0–2.0)
Acid-base deficit: 8.8 mmol/L — ABNORMAL HIGH (ref 0.0–2.0)
Bicarbonate: 13.5 mEq/L — ABNORMAL LOW (ref 20.0–24.0)
Bicarbonate: 16.4 mEq/L — ABNORMAL LOW (ref 20.0–24.0)
Drawn by: 441261
FIO2: 36
O2 Content: 5 L/min
O2 Saturation: 97.2 %
O2 Saturation: 79.4 %
Patient temperature: 98.6
Patient temperature: 99.4
TCO2: 12.9 mmol/L (ref 0–100)
TCO2: 15.9 mmol/L (ref 0–100)
pCO2 arterial: 21.1 mmHg — ABNORMAL LOW (ref 35.0–45.0)
pCO2 arterial: 35.5 mmHg (ref 35.0–45.0)
pH, Arterial: 7.422 (ref 7.350–7.450)
pH, Arterial: 7.289 — ABNORMAL LOW (ref 7.350–7.450)
pO2, Arterial: 96.8 mmHg (ref 80.0–100.0)
pO2, Arterial: 56.2 mmHg — ABNORMAL LOW (ref 80.0–100.0)

## 2015-10-11 LAB — BLOOD GAS, VENOUS
Acid-base deficit: 8.7 mmol/L — ABNORMAL HIGH (ref 0.0–2.0)
Bicarbonate: 16.6 mEq/L — ABNORMAL LOW (ref 20.0–24.0)
FIO2: 36
O2 Saturation: 58.7 %
PCO2 VEN: 35.9 mmHg — AB (ref 45.0–50.0)
PH VEN: 7.288 (ref 7.250–7.300)
PO2 VEN: 39.2 mmHg (ref 31.0–45.0)
Patient temperature: 98.9
TCO2: 16.3 mmol/L (ref 0–100)

## 2015-10-11 LAB — COMPREHENSIVE METABOLIC PANEL
ALT: 102 U/L — ABNORMAL HIGH (ref 14–54)
AST: 147 U/L — ABNORMAL HIGH (ref 15–41)
Albumin: 2.4 g/dL — ABNORMAL LOW (ref 3.5–5.0)
Alkaline Phosphatase: 188 U/L — ABNORMAL HIGH (ref 38–126)
Anion gap: 9 (ref 5–15)
BUN: 57 mg/dL — ABNORMAL HIGH (ref 6–20)
CO2: 16 mmol/L — ABNORMAL LOW (ref 22–32)
Calcium: 7.8 mg/dL — ABNORMAL LOW (ref 8.9–10.3)
Chloride: 108 mmol/L (ref 101–111)
Creatinine, Ser: 2.26 mg/dL — ABNORMAL HIGH (ref 0.44–1.00)
GFR calc Af Amer: 23 mL/min — ABNORMAL LOW (ref >60)
GFR calc non Af Amer: 20 mL/min — ABNORMAL LOW (ref >60)
Glucose, Bld: 119 mg/dL — ABNORMAL HIGH (ref 65–99)
Potassium: 5 mmol/L (ref 3.5–5.1)
Sodium: 133 mmol/L — ABNORMAL LOW (ref 135–145)
Total Bilirubin: 0.6 mg/dL (ref 0.3–1.2)
Total Protein: 4.8 g/dL — ABNORMAL LOW (ref 6.5–8.1)

## 2015-10-11 LAB — LACTIC ACID, PLASMA
Lactic Acid, Venous: 0.9 mmol/L (ref 0.5–1.9)
Lactic Acid, Venous: 1 mmol/L (ref 0.5–1.9)

## 2015-10-11 LAB — PROCALCITONIN: Procalcitonin: 2.39 ng/mL

## 2015-10-11 LAB — URINE MICROSCOPIC-ADD ON

## 2015-10-11 MED ORDER — IBUPROFEN 200 MG PO TABS
200.0000 mg | ORAL_TABLET | Freq: Once | ORAL | Status: AC
  Administered 2015-10-11: 200 mg via ORAL
  Filled 2015-10-11: qty 1

## 2015-10-11 MED ORDER — SODIUM CHLORIDE 0.9 % IV SOLN
1000.0000 mg | Freq: Three times a day (TID) | INTRAVENOUS | Status: DC
  Administered 2015-10-11 (×2): 1000 mg via INTRAVENOUS
  Filled 2015-10-11 (×3): qty 1000

## 2015-10-11 MED ORDER — DEXTROSE 5 % IV SOLN
1.0000 g | Freq: Two times a day (BID) | INTRAVENOUS | Status: DC
  Filled 2015-10-11: qty 1

## 2015-10-11 MED ORDER — FUROSEMIDE 10 MG/ML IJ SOLN
40.0000 mg | Freq: Two times a day (BID) | INTRAMUSCULAR | Status: DC
  Administered 2015-10-11: 40 mg via INTRAVENOUS
  Filled 2015-10-11: qty 4

## 2015-10-11 MED ORDER — ALBUMIN HUMAN 25 % IV SOLN
25.0000 g | Freq: Four times a day (QID) | INTRAVENOUS | Status: DC
  Administered 2015-10-11 – 2015-10-12 (×3): 25 g via INTRAVENOUS
  Filled 2015-10-11: qty 100
  Filled 2015-10-11: qty 50
  Filled 2015-10-11: qty 100

## 2015-10-11 NOTE — Progress Notes (Signed)
PROGRESS NOTE    [name]  [id number] DOB: January 30, 1936 DOA: 10/11/2015 PCP:  Melinda Crutch, MD   Brief Narrative:  80 y.o. woman with a remote history of breast cancer, Non-Hodgkin's lymphoma (followed by Dr. Marin Olp, patient reports recent PET scan x 2 suggestive of recurrent disease, due to see Dr. Marin Olp again in October), CAD, HTN, HLD, grade I diastolic dysfunction, and chronic debility she was wheelchair bound even prior to being admitted to SNF after her last discharge earlier this month.  Admitted with hypotension  Assessment & Plan:   Principal Problem: -Leukocytosis, cause unclear - ID consultant is directing care. Now having a fever. Follow cultures. IV Vanco started. Procalcitonin still elevated. Follow result of WBC tagged scan. Continue antibiotics. Low threshold to repeat imaging studies.    -Hypotension: Resolved significantly.  -Severe Malnutrition - Dietician consult. Guarded prognosis.  -Morbid Obesity  - OSA/OHS  -History of breast cancer S/P Mastectomy  -History of diffuse large cell non-Hodgkin's lymphoma  Active Problems:   Coronary atherosclerosis - Stable.  - Anasarca - Likely related to hypoalbuminemia. History of diastolic dysfunction and mild pulmonary Hypertension. Will start IV Lasix. IV albumin already ordered. Repeat Doppler ultrasound of the upper extremity ordered (Extremity swelling).  Overall, prognosis is guarded.  DVT prophylaxis: Lovenox Code Status: Full Family Communication:  Disposition Plan: To be determined.   Consultants:   Critical care team: signed off 8/22   Procedures: Picc placement.   Antimicrobials: Cefepime and Vancomycin   Subjective: Fever noted. No new complaints.   Objective: Vitals:   10/11/15 0500 10/11/15 0600 10/11/15 0700 10/11/15 0800  BP: (!) 115/47 (!) 104/59 (!) 116/41   Pulse: (!) 101 (!) 101 (!) 103   Resp: (!) 28 (!) 28 (!) 31   Temp:    98.5 F (36.9 C)  TempSrc:    Oral  SpO2:  95% 97% 96%   Weight:      Height:        Intake/Output Summary (Last 24 hours) at 10/11/15 1207 Last data filed at 10/11/15 0700  Gross per 24 hour  Intake              590 ml  Output                0 ml  Net              590 ml   Filed Weights   10/02/15 0600  Weight: 93 kg (205 lb 0.4 oz)    Examination:  General exam: Appears calm and comfortable. Morbidly obese. Bilateral Mastectomy.  Respiratory system: Clear. Cardiovascular system: S1 & S2. Gastrointestinal system: Abdomen is morbidly obese, soft and nontender. Organs are difficult to assess.  Central nervous system: Alert and oriented. Moves all limbs. Extremities: Edema of the extremities, worse LUE.   Data Reviewed: I have personally reviewed following labs and imaging studies  CBC:  Recent Labs Lab 10/06/15 0338 10/07/15 0441 10/08/15 0500 10/10/15 1035 10/11/15 0427  WBC 27.5* 23.0* 21.1* 23.0* 26.3*  NEUTROABS  --   --   --  19.6* 23.9*  HGB 9.7* 8.2* 7.9* 8.2* 7.4*  HCT 30.5* 26.0* 24.9* 24.9* 22.7*  MCV 80.9 82.8 80.1 80.1 78.3  PLT 376 296 278 274 [id number]   Basic Metabolic Panel:  Recent Labs Lab 10/05/15 0406 10/06/15 0338 10/07/15 0441 10/08/15 0500 10/10/15 1035  NA  --  133* 135 132* 132*  K  --  4.9 4.6 4.4 4.7  CL  --  107 108 106 106  CO2  --  19* 22 21* 19*  GLUCOSE  --  145* 103* 100* 103*  BUN  --  25* 29* 36* 47*  CREATININE 1.34* 0.92 1.05* 1.18* 1.69*  CALCIUM  --  7.9* 8.2* 8.2* 8.1*   GFR: Estimated Creatinine Clearance: 30.9 mL/min (by C-G formula based on SCr of 1.69 mg/dL). Liver Function Tests:  Recent Labs Lab 10/07/15 0441 10/10/15 1035  AST 96* 158*  ALT 123* 138*  ALKPHOS 183* 226*  BILITOT 0.7 0.4  PROT 4.5* 4.8*  ALBUMIN 1.8* 1.8*   No results for input(s): LIPASE, AMYLASE in the last 168 hours. No results for input(s): AMMONIA in the last 168 hours. Coagulation Profile: No results for input(s): INR, PROTIME in the last 168 hours. Cardiac  Enzymes:  Recent Labs Lab 10/04/15 2203 10/05/15 0406 10/05/15 1000  TROPONINI 0.05* 0.03* 0.04*   BNP (last 3 results) No results for input(s): PROBNP in the last 8760 hours. HbA1C: No results for input(s): HGBA1C in the last 72 hours. CBG: No results for input(s): GLUCAP in the last 168 hours. Lipid Profile: No results for input(s): CHOL, HDL, LDLCALC, TRIG, CHOLHDL, LDLDIRECT in the last 72 hours. Thyroid Function Tests: No results for input(s): TSH, T4TOTAL, FREET4, T3FREE, THYROIDAB in the last 72 hours. Anemia Panel: No results for input(s): VITAMINB12, FOLATE, FERRITIN, TIBC, IRON, RETICCTPCT in the last 72 hours. Sepsis Labs:  Recent Labs Lab 10/04/15 1812 10/04/15 2203 10/05/15 0406 10/06/15 0338 10/11/15 1102  PROCALCITON 0.52  --  0.50 0.69 2.39  LATICACIDVEN 1.1 1.3  --   --   --     Recent Results (from the past 240 hour(s))  Rapid strep screen     Status: None   Collection Time: 10/08/2015  4:00 PM  Result Value Ref Range Status   Streptococcus, Group A Screen (Direct) NEGATIVE NEGATIVE Final    Comment: (NOTE) A Rapid Antigen test may result negative if the antigen level in the sample is below the detection level of this test. The FDA has not cleared this test as a stand-alone test therefore the rapid antigen negative result has reflexed to a Group A Strep culture.   Culture, group A strep     Status: None   Collection Time: 10/11/2015  4:00 PM  Result Value Ref Range Status   Specimen Description THROAT  Final   Special Requests NONE Reflexed from [id number]  Final   Culture   Final    NO GROUP A STREP (S.PYOGENES) ISOLATED Performed at [facility]    Report Status 10/04/2015 FINAL  Final  Culture, blood (routine x 2)     Status: None   Collection Time: 09/24/2015  6:19 PM  Result Value Ref Range Status   Specimen Description BLOOD BLOOD LEFT ARM  Final   Special Requests BOTTLES DRAWN AEROBIC AND ANAEROBIC 10CC  Final   Culture   Final    NO  GROWTH 5 DAYS Performed at [facility]    Report Status 10/06/2015 FINAL  Final  Culture, blood (routine x 2)     Status: None   Collection Time: 10/13/2015  6:19 PM  Result Value Ref Range Status   Specimen Description BLOOD BLOOD LEFT ARM  Final   Special Requests BOTTLES DRAWN AEROBIC AND ANAEROBIC 10CC  Final   Culture   Final    NO GROWTH 5 DAYS Performed at [facility]    Report Status 10/06/2015 FINAL  Final  MRSA PCR Screening     Status: None   Collection Time: 10/04/15  5:02 PM  Result Value Ref Range Status   MRSA by PCR NEGATIVE NEGATIVE Final    Comment:        The GeneXpert MRSA Assay (FDA approved for NASAL specimens only), is one component of a comprehensive MRSA colonization surveillance program. It is not intended to diagnose MRSA infection nor to guide or monitor treatment for MRSA infections.          Radiology Studies: Ir Fluoro Rm 30-60 Min  Result Date: 10/10/2015 CLINICAL DATA:  Malpositioned left arm PICC line with tip deflected superiorly into base of right internal jugular vein. EXAM: IR FLOURO RM 0-60 MIN MEDICATIONS: None CONTRAST:  None FLUOROSCOPY TIME:  12 seconds. PROCEDURE: Forceful flush of sterile saline through the pre-existing PICC line was performed with a small caliber syringe. COMPLICATIONS: None FINDINGS: The PICC line tip was successfully deflected into the upper SVC. IMPRESSION: Reflection of left arm PICC line tip into SVC with forceful flush of saline. Electronically Signed   By: Aletta Edouard M.D.   On: 10/10/2015 18:03    Scheduled Meds: . albumin human  25 g Intravenous Q6H  . amitriptyline  25 mg Oral QHS  . aspirin EC  81 mg Oral Q breakfast  . atorvastatin  80 mg Oral Q breakfast  . budesonide (PULMICORT) nebulizer solution  0.5 mg Nebulization BID  . ceFEPime (MAXIPIME) IV  1 g Intravenous Q12H  . cholecalciferol  1,000 Units Oral q morning - 10a  . enoxaparin (LOVENOX) injection  40 mg Subcutaneous  Q24H  . feeding supplement  1 Container Oral TID BM  . polyethylene glycol  17 g Oral Daily  . sodium chloride  250 mL Intravenous Once  . vancomycin  1,250 mg Intravenous Q24H   Continuous Infusions: . sodium chloride Stopped (10/09/15 0800)  . norepinephrine (LEVOPHED) Adult infusion Stopped (10/06/15 1546)     LOS: 9 days   Time spent: > 35 minutes  Bonnell Public, MD [facility] Pager [phone]  If 7PM-7AM, please contact night-coverage [email or web address] Password [email or web address] 10/11/2015, 12:07 PM

## 2015-10-11 NOTE — Progress Notes (Addendum)
INFECTIOUS DISEASE PROGRESS NOTE  ID: [name] is a 80 y.o. female with  Principal Problem:   Leukocytosis Active Problems:   Coronary atherosclerosis   Edema   History of B-cell lymphoma   Abnormal chest x-ray   Cellulitis   Joint pain   Sore throat   Acute hypoxemic respiratory failure (HCC)   HCAP (healthcare-associated pneumonia)   Pressure ulcer   AKI (acute kidney injury) ([place])   Protein-calorie malnutrition, severe  Subjective: Temp 102.8 o/n, WBC up, +procalcitonin.  Tachypneic, does not converse  Abtx:  Anti-infectives    Start     Dose/Rate Route Frequency Ordered Stop   10/11/15 2200  ceFEPIme (MAXIPIME) 1 g in dextrose 5 % 50 mL IVPB     1 g 100 mL/hr over 30 Minutes Intravenous Every 12 hours 10/11/15 1029     10/10/15 1800  vancomycin (VANCOCIN) 1,250 mg in sodium chloride 0.9 % 250 mL IVPB     1,250 mg 166.7 mL/hr over 90 Minutes Intravenous Every 24 hours 10/10/15 1732     10/06/15 1600  ceFEPIme (MAXIPIME) 1 g in dextrose 5 % 50 mL IVPB  Status:  Discontinued     1 g 100 mL/hr over 30 Minutes Intravenous Every 8 hours 10/06/15 1042 10/11/15 1029   10/05/15 1100  vancomycin (VANCOCIN) IVPB 750 mg/150 ml premix  Status:  Discontinued     750 mg 150 mL/hr over 60 Minutes Intravenous 2 times daily 10/05/15 1007 10/06/15 1012   10/04/15 1800  ceFEPIme (MAXIPIME) 1 g in dextrose 5 % 50 mL IVPB  Status:  Discontinued     1 g 100 mL/hr over 30 Minutes Intravenous Every 12 hours 10/04/15 1216 10/06/15 1042   10/03/2015 2230  vancomycin (VANCOCIN) 1,250 mg in sodium chloride 0.9 % 250 mL IVPB  Status:  Discontinued     1,250 mg 166.7 mL/hr over 90 Minutes Intravenous Every 12 hours 09/26/2015 2213 10/03/15 2254   09/24/2015 2200  ceFEPIme (MAXIPIME) 1 g in dextrose 5 % 50 mL IVPB  Status:  Discontinued     1 g 100 mL/hr over 30 Minutes Intravenous Every 8 hours 09/17/2015 2159 10/04/15 1216      Medications:  Scheduled: . albumin human  25 g Intravenous Q6H   . amitriptyline  25 mg Oral QHS  . aspirin EC  81 mg Oral Q breakfast  . atorvastatin  80 mg Oral Q breakfast  . budesonide (PULMICORT) nebulizer solution  0.5 mg Nebulization BID  . ceFEPime (MAXIPIME) IV  1 g Intravenous Q12H  . cholecalciferol  1,000 Units Oral q morning - 10a  . enoxaparin (LOVENOX) injection  40 mg Subcutaneous Q24H  . feeding supplement  1 Container Oral TID BM  . furosemide  40 mg Intravenous Q12H  . polyethylene glycol  17 g Oral Daily  . sodium chloride  250 mL Intravenous Once  . vancomycin  1,250 mg Intravenous Q24H    Objective: Vital signs in last 24 hours: Temp:  [98.5 F (36.9 C)-103.1 F (39.5 C)] 98.9 F (37.2 C) (08/26 1208) Pulse Rate:  [90-119] 113 (08/26 1208) Resp:  [22-37] 30 (08/26 1208) BP: (104-157)/(41-76) 119/76 (08/26 1208) SpO2:  [72 %-97 %] 92 % (08/26 1208)   General appearance: alert, severe distress and morbidly obese Resp: diminished breath sounds bilaterally and tachypneic Cardio: tachycardic GI: abnormal findings:  distended and hypoactive bowel sounds Extremities: edema anasraca. LUE swollen, PIC in place.   Lab Results  Recent Labs  10/10/15 1035 10/11/15 0427  WBC 23.0* 26.3*  HGB 8.2* 7.4*  HCT 24.9* 22.7*  NA 132*  --   K 4.7  --   CL 106  --   CO2 19*  --   BUN 47*  --   CREATININE 1.69*  --    Liver Panel  Recent Labs  10/10/15 1035  PROT 4.8*  ALBUMIN 1.8*  AST 158*  ALT 138*  ALKPHOS 226*  BILITOT 0.4   Sedimentation Rate No results for input(s): ESRSEDRATE in the last 72 hours. C-Reactive Protein No results for input(s): CRP in the last 72 hours.  Microbiology: Recent Results (from the past 240 hour(s))  Rapid strep screen     Status: None   Collection Time: 10/08/2015  4:00 PM  Result Value Ref Range Status   Streptococcus, Group A Screen (Direct) NEGATIVE NEGATIVE Final    Comment: (NOTE) A Rapid Antigen test may result negative if the antigen level in the sample is below the  detection level of this test. The FDA has not cleared this test as a stand-alone test therefore the rapid antigen negative result has reflexed to a Group A Strep culture.   Culture, group A strep     Status: None   Collection Time: 10/03/2015  4:00 PM  Result Value Ref Range Status   Specimen Description THROAT  Final   Special Requests NONE Reflexed from [id number]  Final   Culture   Final    NO GROUP A STREP (S.PYOGENES) ISOLATED Performed at [facility]    Report Status 10/04/2015 FINAL  Final  Culture, blood (routine x 2)     Status: None   Collection Time: 09/16/2015  6:19 PM  Result Value Ref Range Status   Specimen Description BLOOD BLOOD LEFT ARM  Final   Special Requests BOTTLES DRAWN AEROBIC AND ANAEROBIC 10CC  Final   Culture   Final    NO GROWTH 5 DAYS Performed at [facility]    Report Status 10/06/2015 FINAL  Final  Culture, blood (routine x 2)     Status: None   Collection Time: 10/12/2015  6:19 PM  Result Value Ref Range Status   Specimen Description BLOOD BLOOD LEFT ARM  Final   Special Requests BOTTLES DRAWN AEROBIC AND ANAEROBIC 10CC  Final   Culture   Final    NO GROWTH 5 DAYS Performed at [facility]    Report Status 10/06/2015 FINAL  Final  MRSA PCR Screening     Status: None   Collection Time: 10/04/15  5:02 PM  Result Value Ref Range Status   MRSA by PCR NEGATIVE NEGATIVE Final    Comment:        The GeneXpert MRSA Assay (FDA approved for NASAL specimens only), is one component of a comprehensive MRSA colonization surveillance program. It is not intended to diagnose MRSA infection nor to guide or monitor treatment for MRSA infections.     Studies/Results: Ir Fluoro Rm 30-60 Min  Result Date: 10/10/2015 CLINICAL DATA:  Malpositioned left arm PICC line with tip deflected superiorly into base of right internal jugular vein. EXAM: IR FLOURO RM 0-60 MIN MEDICATIONS: None CONTRAST:  None FLUOROSCOPY TIME:  12 seconds. PROCEDURE:  Forceful flush of sterile saline through the pre-existing PICC line was performed with a small caliber syringe. COMPLICATIONS: None FINDINGS: The PICC line tip was successfully deflected into the upper SVC. IMPRESSION: Reflection of left arm PICC line tip into SVC with forceful flush of  saline. Electronically Signed   By: Aletta Edouard M.D.   On: 10/10/2015 18:03     Assessment/Plan: Breast CA Non-Hodgkin's Lymphoma (possible recurrence) CAD, HTN Fever, Leukocytosis, hypotension Fluid overload (+880 today) Cr worsening  Total days of antibiotics 13      Day 11 cefepime, Day 1 vanco          Would- Change cefepime to imiepenem- she is quite ill this afternoon and if she does not improve I suspect she will be intubated. She is getting BiPAP now.  Repeat CXR Consider imaging of abd Consider puling PIC (doppler on LUE shows no DVT) Consider diuresis?  Bobby Rumpf Infectious Diseases (pager) [phone] [email or web address] 10/11/2015, 2:14 PM  LOS: 9 days

## 2015-10-11 NOTE — Progress Notes (Signed)
RT attempted to place patient on BiPAP. Patient is purse-lip breathing with RR 35+. Patient educated on purpose of BiPAP, but is still refusing. RN at bedside and MD notified.

## 2015-10-11 NOTE — Progress Notes (Signed)
Pharmacy Antibiotic Note  [name] is a 80 y.o. female admitted on 09/23/2015 with suspected HCAP.  Pharmacy has been following for Vancomycin and Cefepime dosing.    Today is day #11 antibiotics.  Cefepime continued per ID and vancomycin added yesterday for new fever with leukocytosis.    Plan:  Continue Vancomycin 1250 mg IV q24hr; goal trough 15-20 mcg/mL. Check SCr in AM.  Reduce cefepime to 1g q12h for increased SCr.  F/u cultures and ID recommendations.   Height: 5\' 5"  (165.1 cm) Weight: 205 lb 0.4 oz (93 kg) IBW/kg (Calculated) : 57  Last weight documented as 93 kg (09/18/15)  Temp (24hrs), Avg:100.8 F (38.2 C), Min:98.5 F (36.9 C), Max:103.1 F (39.5 C)   Recent Labs Lab 10/04/15 1812 10/04/15 2203 10/05/15 0406 10/06/15 0338 10/07/15 0441 10/08/15 0500 10/10/15 1035 10/11/15 0427  WBC  --   --   --  27.5* 23.0* 21.1* 23.0* 26.3*  CREATININE  --   --  1.34* 0.92 1.05* 1.18* 1.69*  --   LATICACIDVEN 1.1 1.3  --   --   --   --   --   --     Estimated Creatinine Clearance: 30.9 mL/min (by C-G formula based on SCr of 1.69 mg/dL).    No Known Allergies  Antimicrobials this admission: 8/16 Cefepime >>  8/16 Vancomycin >> 8/21; resume 8/25 >>  Dose adjustments this admission: 8/18 VT= 36 on 1250mg  q12hr prior doses charted correctly. 8/19 VR @1000  = 27 8/21 adjusted Cefepime 1g q12h to 1g q8h for improved SCr with WBC/PCT remaining elevated  Microbiology results: 8/16 BCx: ng-final 8/16 Rapid strep screen: neg 8/16 Group A strep cxt: neg 8/19 MRSA PCR: neg 8/25 BCx: sent  Thank you for allowing pharmacy to be a part of this patient's care.  Hershal Coria, PharmD, BCPS Pager: [phone] 10/11/2015 10:31 AM

## 2015-10-11 NOTE — Progress Notes (Signed)
VASCULAR LAB PRELIMINARY  PRELIMINARY  PRELIMINARY  PRELIMINARY  Bilateral upper extremity venous duplex completed.    Preliminary report:  There is no obvious evidence of DVT or SVT noted in the bilateral upper extremities.  There is significant interstitial fluid noted throughout the left upper extremity.    Anylah Scheib, RVT 10/11/2015, 12:56 PM

## 2015-10-11 NOTE — Progress Notes (Signed)
RT has attempted on multiple occasions to convince patient to wear Bipap.  Patient has continue to refuse and states "leave me alone".  Patient not willing to discuss her reason for refusal with RT.

## 2015-10-11 NOTE — Consult Note (Signed)
PULMONARY / CRITICAL CARE MEDICINE   Name: [name] MRN: [id number] DOB: 06/01/1935    ADMISSION DATE:  09/23/2015 CONSULTATION DATE:  10/11/15  REFERRING MD:  Dr. Marthenia Rolling  REASON FOR CONSULT:  Respiratory distress  HISTORY OF PRESENT ILLNESS:   [name] is a 80 y.o. female with distant breast cancer history, non-Hodgkin's lymphoma with suspected recurrence based on FDG avid uptake of hilar and mediastinal lymph nodes on PET scan 09/08/15, CAD and HFpEF (echo 08/25/15 suggesting mild PH), who is chronically debilitated. History is obtained from chart review as the patient is too somnolent to provide it herself. She presented to the [facility] ED on 09/25/2015 complaining of sore throat and joint pain in multiple koints including right hand and wrist. She did not have any infectious symptoms recorded like fevers, cough or GI upset. She did have increased bilateral leg swelling. She was leukocytosis, slightly hypotensive (responded to IV fluids), had worsening parenchymal opacities on CXR and redness over the right hand for which she was started on Cefepime and Vancomycin with blood cultures ultimately returning negative. Of note, she was on 2L/min oxygen by nasal cannula for an unknown period of time before this hospitalization and throughout the next few days she had 2-5L/min requirement and intermittently would use CPAP or BiPAP. She had negative DVT studies in the lower and upper extremities.   PCCM was consulted on 8/19 for respiratory distress and believed the picture fit pulmonary edema vs HAP. She also needed norepinephrine at the time. Antibiotics continued and she was placed on furosemide intermittently, doses held when her creatinine would rise. She was also given supplemental albumin with improvement in blood pressures and cessation of vasopressors.   Today, she was noted to be tachypneic prompting PCCM reconsultation. She is somnolent and follows only simple one step commands. Her nursing staff  says she has been like this at least for the past day or so. She refused BiPAP earlier today and is now on nasal cannula at 4-5L/min.   PAST MEDICAL HISTORY :  She  has a past medical history of Anxiety; Bilateral carotid artery stenosis; CAD (coronary artery disease); Chronic chest pain; Chronic headache; Depression; Diffuse large cell non-Hodgkin's lymphoma [facility]) (oncologist-  dr Marin Olp-  per last office note being monitored for possible recurrence); Dyspnea; GERD (gastroesophageal reflux disease); Hiatal hernia; History of breast cancer (oncologist-  dr Marin Olp--  no recurrence); History of colon polyps; History of colon polyps; History of panic attacks; History of small bowel obstruction; Hyperlipidemia; Hypertension; Low back pain; OSA (obstructive sleep apnea); Osteoarthritis; PMB (postmenopausal bleeding); S/P drug eluting coronary stent placement; and Thickened endometrium.  PAST SURGICAL HISTORY: She  has a past surgical history that includes Tonsillectomy; Inner ear surgery; Submandibular gland excision (Left, 05/10/2013); Portacath placement (06/04/13); Mastectomy with axillary lymph node dissection (Right, 01-03-2001); Cholecystectomy (2005); Cataract extraction w/ intraocular lens  implant, bilateral (2011); Knee arthroscopy (Right); Coronary angioplasty with stent (07-09-2009  dr Darnell Level brodie); Cardiac catheterization (07-15-2010   dr Kathlyn [place]); transthoracic echocardiogram (10-04-2006); Cardiovascular stress test (12-24-2009); Colonoscopy with esophagogastroduodenoscopy (egd) (11-12-2014); and ir generic historical (10/10/2015).  No Known Allergies  No current facility-administered medications on file prior to encounter.    Current Outpatient Prescriptions on File Prior to Encounter  Medication Sig  . acetaminophen (TYLENOL) 500 MG tablet Take 1 tablet (500 mg total) by mouth every 6 (six) hours as needed (for pain.).  Marland Kitchen amitriptyline (ELAVIL) 100 MG tablet Take 100 mg by mouth at  bedtime.  Marland Kitchen  aspirin 81 MG EC tablet Take 81 mg by mouth daily with breakfast.   . atorvastatin (LIPITOR) 80 MG tablet Take 1 tablet (80 mg total) by mouth daily. (Patient taking differently: Take 80 mg by mouth daily with breakfast. )  . cholecalciferol (VITAMIN D) 1000 UNITS tablet Take 1,000 Units by mouth every morning.   . docusate sodium (COLACE) 100 MG capsule Take 1 capsule (100 mg total) by mouth 2 (two) times daily as needed for mild constipation.  Marland Kitchen LORazepam (ATIVAN) 0.5 MG tablet Take 1 tablet (0.5 mg total) by mouth 2 (two) times daily as needed for anxiety or sleep.  . nitroGLYCERIN (NITROSTAT) 0.4 MG SL tablet Place 1 tablet (0.4 mg total) under the tongue every 5 (five) minutes as needed for chest pain.  . Omega-3 Fatty Acids (FISH OIL) 1000 MG CAPS Take 1,000 mg by mouth every morning.   . polyethylene glycol powder (GLYCOLAX/MIRALAX) powder Take 17 g by mouth 2 (two) times daily as needed. (Patient taking differently: Take 17 g by mouth daily. )    FAMILY HISTORY:  Her indicated that her mother is deceased. She indicated that her father is deceased. She indicated that her brother is deceased.    SOCIAL HISTORY: She  reports that she has never smoked. She has never used smokeless tobacco. She reports that she does not drink alcohol or use drugs.  REVIEW OF SYSTEMS:   Unable to be obtained due to pt depressed mental status   VITAL SIGNS: BP 119/76   Pulse (!) 113   Temp 98.9 F (37.2 C) (Oral)   Resp (!) 30   Ht 5\' 5"  (1.651 m)   Wt 93 kg (205 lb 0.4 oz)   SpO2 96%   BMI 34.12 kg/m    INTAKE / OUTPUT: I/O last 3 completed shifts: In: 18 [P.O.:240; I.V.:240; IV Piggyback:400] Out: 275 [Urine:275]  PHYSICAL EXAMINATION: General:  Drowsy, arousable but RASS -1, morbidly obese, thin hair with receeding hairline Neuro:  Follows one step commands with lots of coaching HEENT:  Dry oral mucosa, anicteric eyes, clear anterior oropharyx Cardiovascular:  Tachycardic,  regular rhythm, no murmurs rubs or gallops Lungs:  Good air entry in all lung fields, coarse crackles more so in bilateral bases, resp rate about 22/min when asleep, no accessory muscle use noted Abdomen:  Distended, nontender, no rebound or gaurding Musculoskeletal:  All extremities have 2-3+ pitting edema present Skin:  No major rashes seen  LABS:  BMET  Recent Labs Lab 10/08/15 0500 10/10/15 1035 10/11/15 1800  NA 132* 132* 133*  K 4.4 4.7 5.0  CL 106 106 108  CO2 21* 19* 16*  BUN 36* 47* 57*  CREATININE 1.18* 1.69* 2.26*  GLUCOSE 100* 103* 119*    Electrolytes  Recent Labs Lab 10/08/15 0500 10/10/15 1035 10/11/15 1800  CALCIUM 8.2* 8.1* 7.8*    CBC  Recent Labs Lab 10/08/15 0500 10/10/15 1035 10/11/15 0427  WBC 21.1* 23.0* 26.3*  HGB 7.9* 8.2* 7.4*  HCT 24.9* 24.9* 22.7*  PLT 278 274 240    Coag's No results for input(s): APTT, INR in the last 168 hours.  Sepsis Markers  Recent Labs Lab 10/04/15 2203 10/05/15 0406 10/06/15 0338 10/11/15 1102 10/11/15 1725  LATICACIDVEN 1.3  --   --   --  0.9  PROCALCITON  --  0.50 0.69 2.39  --     ABG  Recent Labs Lab 10/05/15 0353 10/11/15 1615  PHART 7.369 7.422  PCO2ART 34.6* 21.1*  PO2ART  90.3 96.8    Liver Enzymes  Recent Labs Lab 10/07/15 0441 10/10/15 1035 10/11/15 1800  AST 96* 158* 147*  ALT 123* 138* 102*  ALKPHOS 183* 226* 188*  BILITOT 0.7 0.4 0.6  ALBUMIN 1.8* 1.8* 2.4*    Cardiac Enzymes  Recent Labs Lab 10/04/15 2203 10/05/15 0406 10/05/15 1000  TROPONINI 0.05* 0.03* 0.04*    Glucose No results for input(s): GLUCAP in the last 168 hours.  Imaging Dg Chest Port 1 View  Result Date: 10/11/2015 CLINICAL DATA:  Shortness of breath. History of coronary artery disease, sleep apnea, and breast cancer. Nonsmoker. EXAM: PORTABLE CHEST 1 VIEW COMPARISON:  10/09/2015 FINDINGS: The left PICC catheter has been repositioned. The tip is now in the low SVC region. No  pneumothorax. Cardiac enlargement with central pulmonary vascular congestion. Diffuse interstitial pattern to the lungs likely represents edema. No blunting of costophrenic angles. Calcification of the aorta. Degenerative changes in spine shoulder. IMPRESSION: Left PICC line appears in satisfactory position with tip overlying the low SVC region. Persistent cardiac enlargement, pulmonary vascular congestion, and bilateral edema. Electronically Signed   By: Lucienne Capers M.D.   On: 10/11/2015 18:06     STUDIES:  CXRs and CT Chest reviewed from this admission  CULTURES: Blood and sputum cultures from yesterday pending 8/16 blood cultures negative x2  ANTIBIOTICS: Vancomycin Imipenem  LINES/TUBES: Left PICC line   DISCUSSION: [name] is a 80 y.o. female with distant breast cancer history, non-Hodgkin's lymphoma with suspected recurrence based on FDG avid uptake of hilar and mediastinal lymph nodes on PET scan 09/08/15, CAD and HFpEF (echo 08/25/15 suggesting mild PH), who is chronically debilitated who presented with arthralgias, sore throat, hypotension and possible right hand cellulitis. She was treated for septic (maybe mixed with HFpEF cardiogenic shock) with IV fluids and antibiotics initially with improvement but has had a plateau-ing of her recovery. She is newly febrile once more since 10/10/15 and had her Vanc/Cefepime expanded to Vanc/Imipenem with repeat cultures pending. PCCM was consulted in light of respiratory distress.  ASSESSMENT / PLAN:  PULMONARY A: Acute on chronic hypoxic respiratory failure - etiologies include acute on chronic pulmonary interstitial edema, possible HAP on arrival which should be treated.  P:   Patient does not seem in impending respiratory failure. VBG at 8pm is 7.29/35/54.5 on nasal cannula I've spoken to RT who will place patient on BiPAP while sleeping (she is snoring in the room) and repeat VBG in 2-3 hours Agree with plan of diuresis and  antibiotics  CARDIOVASCULAR A:  HFpEF Resolved septic shock but new fevers P:  No intervention at the moment besides diuresis with albumin  RENAL A:   Worsening acute kidney injury - likely multifactorial from multiple antibiotics, HFpEF and diuresis. Also has a non-anion gap metabolic acidosis (lactate 0.9) most likely renal failure related P:   May benefit from hemodialysis / nephrology consult in the near future if it becomes difficult to diurese medically without metabolic or hemodynamic derangements.  GASTROINTESTINAL A:   GERD P:   NPO for now recommended  HEMATOLOGIC A:   Hodgkin's lymphoma, maybe recurrent, hilar and mediastinal lymphadenopathy on PET in July. P:  Follws with Dr. Martha Clan.    INFECTIOUS A:   Presumptive HAP treated New fevers, leukocytosis concerning for new infection P:   ID following, agree with broad spectrum antibiotics while awaiting cultures  ENDOCRINE A:   (-) issues   P:   CBG q 4 if NPO. Sliding scale.  NEUROLOGIC A:   Delirium - ddx includes uremia, sepsis, medications Anxiety and depression P:   Delirium precautions   FAMILY  - Inter-disciplinary family meet or Palliative Care meeting due by:  10/11/15   I spent 30 minutes of critical care time with this patient today  Beverely Low MD Pulmonary and [place] Pager: [phone]  10/11/2015, 7:59 PM

## 2015-10-11 NOTE — Progress Notes (Addendum)
Rt placed pt on her BIPAP black box due to sob, wob and low sats. Pt sats 96% with 4 lpm bleed in on BIPAP. Pt is getting some lasix. Rt will remove BIPAP when pt recovers and her sob and wob gets better.

## 2015-10-11 NOTE — Progress Notes (Signed)
Pharmacy Antibiotic Note  [name] is a 80 y.o. female admitted on 09/27/2015 with suspected HCAP.  Pharmacy has been following for Vancomycin and Cefepime dosing.    Today is day #11 antibiotics Plan:  Continue Vancomycin 1250 mg IV q24hr; goal trough 15-20 mcg/mL. Check SCr in AM.  Change cefepime to Primaxin per ID - for CrCl > 90, start Primaxin 1g IV q8  F/u cultures and ID recommendations.   Height: 5\' 5"  (165.1 cm) Weight: 205 lb 0.4 oz (93 kg) IBW/kg (Calculated) : 57  Last weight documented as 93 kg (09/18/15)  Temp (24hrs), Avg:100.6 F (38.1 C), Min:98.5 F (36.9 C), Max:103.1 F (39.5 C)   Recent Labs Lab 10/04/15 1812 10/04/15 2203 10/05/15 0406 10/06/15 0338 10/07/15 0441 10/08/15 0500 10/10/15 1035 10/11/15 0427  WBC  --   --   --  27.5* 23.0* 21.1* 23.0* 26.3*  CREATININE  --   --  1.34* 0.92 1.05* 1.18* 1.69*  --   LATICACIDVEN 1.1 1.3  --   --   --   --   --   --     Estimated Creatinine Clearance: 30.9 mL/min (by C-G formula based on SCr of 1.69 mg/dL).    No Known Allergies  Antimicrobials this admission: 8/16 Cefepime >> 8/26 8/16 Vancomycin >> 8/21; resume 8/25 >> 8/26 Primaxin >>  Dose adjustments this admission: 8/18 VT= 36 on 1250mg  q12hr prior doses charted correctly. 8/19 VR @1000  = 27 8/21 adjusted Cefepime 1g q12h to 1g q8h for improved SCr with WBC/PCT remaining elevated  Microbiology results: 8/16 BCx: ng-final 8/16 Rapid strep screen: neg 8/16 Group A strep cxt: neg 8/19 MRSA PCR: neg 8/25 BCx: sent    Thank you for allowing pharmacy to be a part of this patient's care.   Adrian Saran, PharmD, BCPS Pager [phone] 10/11/2015 2:35 PM

## 2015-10-12 ENCOUNTER — Inpatient Hospital Stay (HOSPITAL_COMMUNITY): Payer: PPO

## 2015-10-12 DIAGNOSIS — J9600 Acute respiratory failure, unspecified whether with hypoxia or hypercapnia: Secondary | ICD-10-CM

## 2015-10-12 DIAGNOSIS — Z9911 Dependence on respirator [ventilator] status: Secondary | ICD-10-CM

## 2015-10-12 DIAGNOSIS — I878 Other specified disorders of veins: Secondary | ICD-10-CM

## 2015-10-12 LAB — CBC WITH DIFFERENTIAL/PLATELET
Basophils Absolute: 0 10*3/uL (ref 0.0–0.1)
Basophils Absolute: 0 10*3/uL (ref 0.0–0.1)
Basophils Relative: 0 %
Basophils Relative: 0 %
Eosinophils Absolute: 1.2 10*3/uL — ABNORMAL HIGH (ref 0.0–0.7)
Eosinophils Absolute: 2.2 10*3/uL — ABNORMAL HIGH (ref 0.0–0.7)
Eosinophils Relative: 4 %
Eosinophils Relative: 9 %
HCT: 21.4 % — ABNORMAL LOW (ref 36.0–46.0)
HEMATOCRIT: 25.3 % — AB (ref 36.0–46.0)
Hemoglobin: 6.9 g/dL — CL (ref 12.0–15.0)
Hemoglobin: 8.2 g/dL — ABNORMAL LOW (ref 12.0–15.0)
LYMPHS ABS: 0.7 10*3/uL (ref 0.7–4.0)
Lymphocytes Relative: 1 %
Lymphocytes Relative: 3 %
Lymphs Abs: 0.3 10*3/uL — ABNORMAL LOW (ref 0.7–4.0)
MCH: 25.9 pg — ABNORMAL LOW (ref 26.0–34.0)
MCH: 26.1 pg (ref 26.0–34.0)
MCHC: 32.2 g/dL (ref 30.0–36.0)
MCHC: 32.4 g/dL (ref 30.0–36.0)
MCV: 80.5 fL (ref 78.0–100.0)
MCV: 80.6 fL (ref 78.0–100.0)
MONOS PCT: 3 %
Monocytes Absolute: 1.2 10*3/uL — ABNORMAL HIGH (ref 0.1–1.0)
Monocytes Absolute: 0.7 10*3/uL (ref 0.1–1.0)
Monocytes Relative: 4 %
NEUTROS ABS: 20.6 10*3/uL — AB (ref 1.7–7.7)
Neutro Abs: 26.3 10*3/uL — ABNORMAL HIGH (ref 1.7–7.7)
Neutrophils Relative %: 91 %
Neutrophils Relative %: 85 %
Platelets: 200 10*3/uL (ref 150–400)
Platelets: 174 10*3/uL (ref 150–400)
RBC: 2.66 MIL/uL — ABNORMAL LOW (ref 3.87–5.11)
RBC: 3.14 MIL/uL — ABNORMAL LOW (ref 3.87–5.11)
RDW: 19.1 % — ABNORMAL HIGH (ref 11.5–15.5)
RDW: 18.1 % — AB (ref 11.5–15.5)
WBC Morphology: INCREASED
WBC: 29 10*3/uL — ABNORMAL HIGH (ref 4.0–10.5)
WBC: 24.2 10*3/uL — ABNORMAL HIGH (ref 4.0–10.5)

## 2015-10-12 LAB — BODY FLUID CELL COUNT WITH DIFFERENTIAL
LYMPHS FL: 7 %
NEUTROPHIL FLUID: 93 % — AB (ref 0–25)
WBC FLUID: 119 uL (ref 0–1000)

## 2015-10-12 LAB — BLOOD GAS, ARTERIAL
Acid-base deficit: 10.6 mmol/L — ABNORMAL HIGH (ref 0.0–2.0)
Acid-base deficit: 11.8 mmol/L — ABNORMAL HIGH (ref 0.0–2.0)
BICARBONATE: 14.1 meq/L — AB (ref 20.0–24.0)
Bicarbonate: 13.9 mEq/L — ABNORMAL LOW (ref 20.0–24.0)
DRAWN BY: 441261
Delivery systems: POSITIVE
Drawn by: 449561
EXPIRATORY PAP: 5
FIO2: 36
FIO2: 100
Inspiratory PAP: 12
MECHVT: 400 mL
O2 SAT: 90.1 %
O2 Saturation: 99.4 %
PATIENT TEMPERATURE: 100
PEEP: 5 cmH2O
PH ART: 7.31 — AB (ref 7.350–7.450)
PH ART: 7.257 — AB (ref 7.350–7.450)
PO2 ART: 73.1 mmHg — AB (ref 80.0–100.0)
Patient temperature: 99
RATE: 24 resp/min
TCO2: 13.7 mmol/L (ref 0–100)
TCO2: 13.7 mmol/L (ref 0–100)
pCO2 arterial: 29.2 mmHg — ABNORMAL LOW (ref 35.0–45.0)
pCO2 arterial: 32.3 mmHg — ABNORMAL LOW (ref 35.0–45.0)
pO2, Arterial: 323 mmHg — ABNORMAL HIGH (ref 80.0–100.0)

## 2015-10-12 LAB — GLUCOSE, CAPILLARY
GLUCOSE-CAPILLARY: 72 mg/dL (ref 65–99)
Glucose-Capillary: 104 mg/dL — ABNORMAL HIGH (ref 65–99)
Glucose-Capillary: 71 mg/dL (ref 65–99)

## 2015-10-12 LAB — BASIC METABOLIC PANEL
Anion gap: 9 (ref 5–15)
BUN: 66 mg/dL — AB (ref 6–20)
CHLORIDE: 110 mmol/L (ref 101–111)
CO2: 15 mmol/L — AB (ref 22–32)
CREATININE: 2.76 mg/dL — AB (ref 0.44–1.00)
Calcium: 7.6 mg/dL — ABNORMAL LOW (ref 8.9–10.3)
GFR calc Af Amer: 18 mL/min — ABNORMAL LOW (ref >60)
GFR calc non Af Amer: 15 mL/min — ABNORMAL LOW (ref >60)
GLUCOSE: 74 mg/dL (ref 65–99)
Potassium: 5.2 mmol/L — ABNORMAL HIGH (ref 3.5–5.1)
SODIUM: 134 mmol/L — AB (ref 135–145)

## 2015-10-12 LAB — RENAL FUNCTION PANEL
Albumin: 2.9 g/dL — ABNORMAL LOW (ref 3.5–5.0)
Anion gap: 10 (ref 5–15)
BUN: 66 mg/dL — ABNORMAL HIGH (ref 6–20)
CO2: 16 mmol/L — ABNORMAL LOW (ref 22–32)
Calcium: 7.8 mg/dL — ABNORMAL LOW (ref 8.9–10.3)
Chloride: 108 mmol/L (ref 101–111)
Creatinine, Ser: 2.44 mg/dL — ABNORMAL HIGH (ref 0.44–1.00)
GFR calc Af Amer: 21 mL/min — ABNORMAL LOW (ref >60)
GFR calc non Af Amer: 18 mL/min — ABNORMAL LOW (ref >60)
Glucose, Bld: 90 mg/dL (ref 65–99)
Phosphorus: 5.2 mg/dL — ABNORMAL HIGH (ref 2.5–4.6)
Potassium: 5.1 mmol/L (ref 3.5–5.1)
Sodium: 134 mmol/L — ABNORMAL LOW (ref 135–145)

## 2015-10-12 LAB — ABO/RH: ABO/RH(D): A POS

## 2015-10-12 LAB — CREATININE, SERUM
Creatinine, Ser: 2.46 mg/dL — ABNORMAL HIGH (ref 0.44–1.00)
GFR calc Af Amer: 21 mL/min — ABNORMAL LOW (ref >60)
GFR calc non Af Amer: 18 mL/min — ABNORMAL LOW (ref >60)

## 2015-10-12 LAB — MAGNESIUM
Magnesium: 1.8 mg/dL (ref 1.7–2.4)
Magnesium: 1.7 mg/dL (ref 1.7–2.4)

## 2015-10-12 LAB — PHOSPHORUS
Phosphorus: 5 mg/dL — ABNORMAL HIGH (ref 2.5–4.6)
Phosphorus: 5.7 mg/dL — ABNORMAL HIGH (ref 2.5–4.6)

## 2015-10-12 LAB — PREPARE RBC (CROSSMATCH)

## 2015-10-12 LAB — PNEUMOCYSTIS JIROVECI SMEAR BY DFA: PNEUMOCYSTIS JIROVECI AG: NEGATIVE

## 2015-10-12 MED ORDER — INSULIN ASPART 100 UNIT/ML ~~LOC~~ SOLN
0.0000 [IU] | SUBCUTANEOUS | Status: DC
  Administered 2015-10-13: 3 [IU] via SUBCUTANEOUS
  Administered 2015-10-13: 7 [IU] via SUBCUTANEOUS
  Administered 2015-10-13: 3 [IU] via SUBCUTANEOUS
  Administered 2015-10-13 (×2): 2 [IU] via SUBCUTANEOUS
  Administered 2015-10-13: 5 [IU] via SUBCUTANEOUS
  Administered 2015-10-14: 2 [IU] via SUBCUTANEOUS
  Administered 2015-10-14 (×3): 3 [IU] via SUBCUTANEOUS
  Administered 2015-10-14: 2 [IU] via SUBCUTANEOUS
  Administered 2015-10-14: 3 [IU] via SUBCUTANEOUS
  Administered 2015-10-15 (×3): 2 [IU] via SUBCUTANEOUS

## 2015-10-12 MED ORDER — SODIUM CHLORIDE 0.9 % IV SOLN: INTRAVENOUS | Status: DC | PRN

## 2015-10-12 MED ORDER — SODIUM CHLORIDE 0.9 % IV SOLN
10.0000 ug/h | INTRAVENOUS | Status: DC
  Filled 2015-10-12 (×2): qty 50

## 2015-10-12 MED ORDER — PRO-STAT SUGAR FREE PO LIQD
30.0000 mL | Freq: Every day | ORAL | Status: DC
  Administered 2015-10-13 – 2015-10-14 (×2): 30 mL
  Filled 2015-10-12 (×3): qty 30

## 2015-10-12 MED ORDER — FENTANYL CITRATE (PF) 100 MCG/2ML IJ SOLN
INTRAMUSCULAR | Status: AC
  Filled 2015-10-12: qty 4

## 2015-10-12 MED ORDER — IMIPENEM-CILASTATIN 500 MG IV SOLR
500.0000 mg | Freq: Two times a day (BID) | INTRAVENOUS | Status: DC
  Administered 2015-10-12 – 2015-10-15 (×7): 500 mg via INTRAVENOUS
  Filled 2015-10-12 (×8): qty 500

## 2015-10-12 MED ORDER — LINEZOLID 600 MG/300ML IV SOLN
600.0000 mg | Freq: Two times a day (BID) | INTRAVENOUS | Status: DC
Start: 2015-10-12 — End: 2015-10-15
  Administered 2015-10-12 – 2015-10-15 (×6): 600 mg via INTRAVENOUS
  Filled 2015-10-12 (×7): qty 300

## 2015-10-12 MED ORDER — MIDAZOLAM HCL 2 MG/2ML IJ SOLN
1.0000 mg | Freq: Once | INTRAMUSCULAR | Status: AC
  Administered 2015-10-12: 1 mg via INTRAVENOUS

## 2015-10-12 MED ORDER — FUROSEMIDE 10 MG/ML IJ SOLN
80.0000 mg | Freq: Once | INTRAMUSCULAR | Status: AC
Start: 2015-10-12 — End: 2015-10-12
  Administered 2015-10-12: 80 mg via INTRAVENOUS
  Filled 2015-10-12: qty 8

## 2015-10-12 MED ORDER — DEXTROSE 5 % IV SOLN
2.0000 ug/min | INTRAVENOUS | Status: DC
  Administered 2015-10-12: 2 ug/min via INTRAVENOUS
  Filled 2015-10-12 (×2): qty 4

## 2015-10-12 MED ORDER — FENTANYL CITRATE (PF) 100 MCG/2ML IJ SOLN
100.0000 ug | Freq: Once | INTRAMUSCULAR | Status: AC
  Administered 2015-10-12: 100 ug via INTRAVENOUS

## 2015-10-12 MED ORDER — ACETAMINOPHEN 160 MG/5ML PO SOLN
650.0000 mg | Freq: Four times a day (QID) | ORAL | Status: DC | PRN
  Administered 2015-10-12 – 2015-10-15 (×4): 650 mg
  Filled 2015-10-12 (×4): qty 20.3

## 2015-10-12 MED ORDER — HEPARIN SODIUM (PORCINE) 5000 UNIT/ML IJ SOLN
5000.0000 [IU] | Freq: Three times a day (TID) | INTRAMUSCULAR | Status: DC
  Administered 2015-10-12 – 2015-10-15 (×10): 5000 [IU] via SUBCUTANEOUS
  Filled 2015-10-12 (×10): qty 1

## 2015-10-12 MED ORDER — SODIUM CHLORIDE 0.9 % IV SOLN
Freq: Once | INTRAVENOUS | Status: AC
  Administered 2015-10-12: 09:00:00 via INTRAVENOUS

## 2015-10-12 MED ORDER — HALOPERIDOL LACTATE 5 MG/ML IJ SOLN
1.0000 mg | INTRAMUSCULAR | Status: DC | PRN
  Filled 2015-10-12: qty 1

## 2015-10-12 MED ORDER — FUROSEMIDE 10 MG/ML IJ SOLN
80.0000 mg | Freq: Once | INTRAMUSCULAR | Status: DC
  Filled 2015-10-12: qty 8

## 2015-10-12 MED ORDER — HALOPERIDOL LACTATE 5 MG/ML IJ SOLN
1.0000 mg | Freq: Once | INTRAMUSCULAR | Status: AC
  Administered 2015-10-12: 2 mg via INTRAVENOUS

## 2015-10-12 MED ORDER — MIDAZOLAM HCL 2 MG/2ML IJ SOLN
INTRAMUSCULAR | Status: AC
  Filled 2015-10-12: qty 4

## 2015-10-12 MED ORDER — PANTOPRAZOLE SODIUM 40 MG PO TBEC
40.0000 mg | DELAYED_RELEASE_TABLET | Freq: Every day | ORAL | Status: DC
  Administered 2015-10-12: 40 mg via ORAL
  Filled 2015-10-12: qty 1

## 2015-10-12 MED ORDER — PRO-STAT SUGAR FREE PO LIQD: 30.0000 mL | Freq: Two times a day (BID) | ORAL | Status: DC

## 2015-10-12 MED ORDER — ETOMIDATE 2 MG/ML IV SOLN
10.0000 mg | Freq: Once | INTRAVENOUS | Status: AC
  Administered 2015-10-12: 10 mg via INTRAVENOUS

## 2015-10-12 MED ORDER — VITAL HIGH PROTEIN PO LIQD
1000.0000 mL | ORAL | Status: DC
  Administered 2015-10-12 – 2015-10-14 (×3): 1000 mL
  Filled 2015-10-12 (×6): qty 1000

## 2015-10-12 MED ORDER — ETOMIDATE 2 MG/ML IV SOLN
5.0000 mg | Freq: Once | INTRAVENOUS | Status: AC
  Administered 2015-10-12: 5 mg via INTRAVENOUS

## 2015-10-12 MED ORDER — VITAL HIGH PROTEIN PO LIQD
1000.0000 mL | ORAL | Status: DC
  Filled 2015-10-12: qty 1000

## 2015-10-12 MED ORDER — SODIUM CHLORIDE 0.9 % IV SOLN
25.0000 ug/h | INTRAVENOUS | Status: DC
  Administered 2015-10-12: 100 ug/h via INTRAVENOUS
  Administered 2015-10-13: 50 ug/h via INTRAVENOUS
  Administered 2015-10-14: 150 ug/h via INTRAVENOUS
  Administered 2015-10-14 – 2015-10-15 (×3): 200 ug/h via INTRAVENOUS
  Filled 2015-10-12 (×4): qty 50

## 2015-10-12 MED ORDER — SODIUM CHLORIDE 0.9 % IV BOLUS (SEPSIS)
500.0000 mL | Freq: Once | INTRAVENOUS | Status: AC
  Administered 2015-10-12: 500 mL via INTRAVENOUS

## 2015-10-12 NOTE — Progress Notes (Signed)
eLink Physician-Brief Progress Note Patient Name: [name] DOB: 06-20-1935 MRN: [id number]   Date of Service  10/12/2015  HPI/Events of Note  Persistent hypotension  eICU Interventions  Start levophed.  Check CVP     Intervention Category Major Interventions: Hypotension - evaluation and management  Asencion Noble 10/12/2015, 10:11 PM

## 2015-10-12 NOTE — Progress Notes (Signed)
PICC exchange ordered and attempted by Colin Ina RN.Unable to advance PICC to SVC. Meeting obstruction causing PICC tip to migrate to Right IJ region.  Multiple attempts to power flush, rad wire used and reposition pt unsuccessful.  Dr Asencion Noble notified via [place].  Plan to have CVC placed.  Able to place 22 G  PIV in [place]- MD aware.

## 2015-10-12 NOTE — Progress Notes (Addendum)
Nutrition Follow-up  DOCUMENTATION CODES:   Non-severe (moderate) malnutrition in context of acute illness/injury, Obesity unspecified  INTERVENTION:   Initiate PEPUP protocol:  Vital HP @ 50 ml/hr via ETT. 30 ml Prostat once daily.    Tube feeding regimen provides 1300 kcal (62% of needs), 120 grams of protein, and 1003 ml of H2O.   RD to continue to monitor  NUTRITION DIAGNOSIS:   Increased nutrient needs related to wound healing as evidenced by estimated needs.  Ongoing.  GOAL:   Provide needs based on ASPEN/SCCM guidelines  Not meeting.  MONITOR:   Vent status, Labs, Weight trends, TF tolerance, Skin, I & O's  REASON FOR ASSESSMENT:   Consult Enteral/tube feeding initiation and management  ASSESSMENT:   [name] is a 80 y.o. woman with a remote history of breast cancer, Non-Hodgkin's lymphoma (followed by Dr. Marin Olp, patient reports recent PET scan x 2 suggestive of recurrent disease, due to see Dr. Marin Olp again in October), CAD, HTN, HLD, grade I diastolic dysfunction, and chronic debility (she was wheelchair bound even prior to being admitted to SNF after her last discharge earlier this month). During her last admission 8/2-8/5, she had severe sepsis attributed to UTI. Patient now presents with suspected pneumonia.  Per MD note, pt failed Bipap. Pt continued poor PO intake and was not drinking supplements. Tube feeding protocol initiated.    Patient is currently intubated on ventilator support via ETT MV: 15 L/min Temp (24hrs), Avg:99.8 F (37.7 C), Min:98.1 F (36.7 C), Max:101.6 F (38.7 C)  Medications: Vitamin D tablet daily, IV Lasix, Miralax packet daily Labs reviewed: Low Na Mg WNL Elevated Phos  Diet Order:  Diet Heart Room service appropriate? Yes; Fluid consistency: Thin  Skin:  Wound (see comment) (Stage 2 R buttocks pressure injury, DTI to R buttocks)  Last BM:  8/20  Height:   Ht Readings from Last 1 Encounters:  10/12/15 5\' 5"   (1.651 m)    Weight:   Wt Readings from Last 1 Encounters:  10/02/15 205 lb 0.4 oz (93 kg)    Ideal Body Weight:  56.8 kg  BMI:  Body mass index is 34.12 kg/m.  Estimated Nutritional Needs:   Kcal:  [id number]  Protein:  110-120g  Fluid:  2L/day  EDUCATION NEEDS:   No education needs identified at this time  Clayton Bibles, MS, RD, LDN Pager: [phone] After Hours Pager: [phone]

## 2015-10-12 NOTE — Progress Notes (Signed)
eLink Physician-Brief Progress Note Patient Name: [name] DOB: [birth year] MRN: [id number]   Date of Service  10/12/2015  HPI/Events of Note  AKI - metab acidosis delerium - fidgety on camera On bipap  eICU Interventions  Lasix 80 now, did not respond to 40 Haldol prn      Intervention Category Major Interventions: Delirium, psychosis, severe agitation - evaluation and management;Respiratory failure - evaluation and management  ALVA,RAKESH V. 10/12/2015, 12:33 AM

## 2015-10-12 NOTE — Progress Notes (Signed)
Received verbal order for Arterial line from Georgann Housekeeper, NP.  Limited to left radial stick due to mastectomy.  Multiple attempts by RT x 2 unsuccessful.  RN made aware.

## 2015-10-12 NOTE — Progress Notes (Signed)
RN called patient's son, Caprice Trumbull, to obtain consent to administer blood products because the patient is too confused to sign her own consent at this time. The son's wife answered the phone and initially said he was out of town and that he was estranged from his mother and has been estranged from her most of his adult life. When asked, the wife stated that he is the patient's healthcare power of attorney. When RN asked how to reach him she asked what the call was about and when RN told her "a medical decision needs to be made" she then admitted he was home and went to wake him. RN then spoke to the patient's son and he gave verbal consent over the phone. He then asked what was going on with his mother. He did not know she was in the hospital. RN gave him an update on the patient's condition.  Zeinab RN was witness to this conversation.   RN will put in social work consult.

## 2015-10-12 NOTE — Procedures (Signed)
Bronchoscopy Procedure Note [name] [id number] 11/27/1935  Procedure: Bronchoscopy Indications: Diagnostic evaluation of the airways  Procedure Details Consent: Risks of procedure as well as the alternatives and risks of each were explained to the (patient/caregiver).  Consent for procedure obtained. Time Out: Verified patient identification, verified procedure, site/side was marked, verified correct patient position, special equipment/implants available, medications/allergies/relevent history reviewed, required imaging and test results available.  Performed  In preparation for procedure, patient was given 100% FiO2 and bronchoscope lubricated. Sedation: Etomidate  Airway entered and the following bronchi were examined: RUL, RML, RLL, LUL, LLL and Bronchi.   Procedures performed: Brushings performed Bronchoscope removed.  , Patient placed back on 100% FiO2 at conclusion of procedure.    Evaluation Hemodynamic Status: BP stable throughout; O2 sats: stable throughout Patient's Current Condition: stable Specimens:  Sent serous Complications: No apparent complications Patient did tolerate procedure well.   [name]. 10/12/2015   1. Diffuse patchy distal plugs 2. ETT wnl 3. No mass mesions 4. BAL RML, lingula  [name]. [name] Mould, MD, [place] Pgr: [place] [facility]

## 2015-10-12 NOTE — Procedures (Signed)
Intubation Procedure Note [name] [id number] 11-06-1935  Procedure: Intubation Indications: Respiratory insufficiency  Procedure Details Consent: Risks of procedure as well as the alternatives and risks of each were explained to the (patient/caregiver).  Consent for procedure obtained. Time Out: Verified patient identification, verified procedure, site/side was marked, verified correct patient position, special equipment/implants available, medications/allergies/relevent history reviewed, required imaging and test results available.  Performed  Maximum sterile technique was used including gown, hand hygiene and mask.  MAC and 3 glide    Evaluation Hemodynamic Status: BP stable throughout; O2 sats: stable throughout Patient's Current Condition: stable Complications: No apparent complications Patient did tolerate procedure well. Chest X-ray ordered to verify placement.  CXR: pending.   [name]. 10/12/2015   Pus hard yellow all over airway  [name]. Titus Mould, MD, [place] Pgr: [place] [facility]

## 2015-10-12 NOTE — Progress Notes (Signed)
eLink Physician-Brief Progress Note Patient Name: [name] DOB: Jul 12, 1935 MRN: [id number]   Date of Service  10/12/2015  HPI/Events of Note  Request to change Tylenol tablet to liquid. Currently on 1 gm PO Q 6 hours. AST and ALT both elevated.   eICU Interventions  Will order: 1. Tylenol liquid 650 mg per tube Q 6 hours PRN temp > 101.5 F.  Follow AST and ALT closely. May need to D/C Tylenol if they bump further.      Intervention Category Minor Interventions: Routine modifications to care plan (e.g. PRN medications for pain, fever)  [name] 10/12/2015, 11:16 PM

## 2015-10-12 NOTE — Progress Notes (Signed)
Pharmacy Antibiotic Note  [name] is a 80 y.o. female admitted on 10/07/2015 with suspected HCAP.  Patient has been clinically worsening, intubated today, PCT increasing.  ID following and recommended escalating antibiotic coverage from Cefepime to Primaxin.  Pharmacy has been following for Vancomycin and Primaxin dosing.    Today is day #12 total antibiotics.  Day #3 vancomycin and day #2 Primaxin.  Plan:  Continue Vancomycin 1250 mg IV q24hr; goal trough 15-20 mcg/mL. Will check a vancomycin trough level this evening.  Will not be a steady state level but given AKI, would like to make sure patient is not accumulating.   Reduce Primaxin to 500 mg IV q12h for CrCl<30 ml/min.  F/u cultures and ID recommendations.   Height: 5\' 5"  (165.1 cm) Weight: 205 lb 0.4 oz (93 kg) IBW/kg (Calculated) : 57  Last weight documented as 93 kg (09/18/15)  Temp (24hrs), Avg:99.8 F (37.7 C), Min:98.4 F (36.9 C), Max:101.6 F (38.7 C)   Recent Labs Lab 10/07/15 0441 10/08/15 0500 10/10/15 1035 10/11/15 0427 10/11/15 1725 10/11/15 1800 10/11/15 2025 10/12/15 0400 10/12/15 0500  WBC 23.0* 21.1* 23.0* 26.3*  --   --   --  29.0*  --   CREATININE 1.05* 1.18* 1.69*  --   --  2.26*  --  2.46* 2.44*  LATICACIDVEN  --   --   --   --  0.9  --  1.0  --   --     Estimated Creatinine Clearance: 21.4 mL/min (by C-G formula based on SCr of 2.44 mg/dL).    No Known Allergies  Antimicrobials this admission: 8/16 Cefepime >> 8/26 8/16 Vancomycin >> 8/21; resume 8/25 >> 8/26 Primaxin >>  Dose adjustments this admission: 8/18 VT= 36 on 1250mg  q12hr prior doses charted correctly. 8/19 VR @1000  = 27 8/21 adjusted Cefepime 1g q12h to 1g q8h for improved SCr with WBC/PCT remaining elevated 8/26 Reduce cefepime to 1g q12h for increased SCr 8/27 Adjust Primaxin to 500 mg q12h for CrCl~21   Microbiology results: 8/16 BCx: ng-final 8/16 Rapid strep screen: neg 8/16 Group A strep cxt: neg 8/19 MRSA  PCR: neg 8/25 BCx: sent 8/26 Ucx: sent 8/27 BAL cx: sent  Thank you for allowing pharmacy to be a part of this patient's care.  Hershal Coria, PharmD, BCPS Pager: [phone] 10/12/2015 9:26 AM

## 2015-10-12 NOTE — Procedures (Signed)
Arterial Catheter Insertion Procedure Note Arthur [place] Anna [id number] 1935/10/23  Procedure: Insertion of Arterial Catheter  Indications: Blood pressure monitoring  Procedure Details Consent: Unable to obtain consent because of altered level of consciousness. Time Out: Verified patient identification, verified procedure, site/side was marked, verified correct patient position, special equipment/implants available, medications/allergies/relevent history reviewed, required imaging and test results available.  Performed  Maximum sterile technique was used including antiseptics, cap, gloves, gown, hand hygiene, mask and sheet. Skin prep: Chlorhexidine; local anesthetic administered 20 gauge catheter was inserted into left radial artery using the Seldinger technique.  Evaluation Blood flow good; BP tracing good. Complications: No apparent complications.  Request to reattampt arterial line due to persistant hypotension and unreliable NIBP.     Rayne Du [facility] 10/12/2015

## 2015-10-12 NOTE — Progress Notes (Signed)
eLink Physician-Brief Progress Note Patient Name: [name] DOB: Sep 16, 1935 MRN: [id number]   Date of Service  10/12/2015  HPI/Events of Note  Hb 6.9 Oliguria,CXR s/o edema  eICU Interventions  1 u prbc followed by 80 lasix Dc albumin to avoid fluid overload     Intervention Category Intermediate Interventions: Bleeding - evaluation and treatment with blood products  ALVA,RAKESH V. 10/12/2015, 4:57 AM

## 2015-10-12 NOTE — Consult Note (Signed)
PULMONARY / CRITICAL CARE MEDICINE   Name: [name] MRN: [id number] DOB: Nov 06, 1935    ADMISSION DATE:  10/06/2015 CONSULTATION DATE:  10/11/15  REFERRING MD:  Dr. Marthenia Rolling  REASON FOR CONSULT:  Respiratory distress  HISTORY OF PRESENT ILLNESS:   [name] is a 80 y.o. female with distant breast cancer history, non-Hodgkin's lymphoma with suspected recurrence based on FDG avid uptake of hilar and mediastinal lymph nodes on PET scan 09/08/15, CAD and HFpEF (echo 08/25/15 suggesting mild PH), who is chronically debilitated. History is obtained from chart review as the patient is too somnolent to provide it herself. She presented to the [facility] ED on 09/25/2015 complaining of sore throat and joint pain in multiple koints including right hand and wrist. She did not have any infectious symptoms recorded like fevers, cough or GI upset. She did have increased bilateral leg swelling. She was leukocytosis, slightly hypotensive (responded to IV fluids), had worsening parenchymal opacities on CXR and redness over the right hand for which she was started on Cefepime and Vancomycin with blood cultures ultimately returning negative. Of note, she was on 2L/min oxygen by nasal cannula for an unknown period of time before this hospitalization and throughout the next few days she had 2-5L/min requirement and intermittently would use CPAP or BiPAP. She had negative DVT studies in the lower and upper extremities.   PCCM was consulted on 8/19 for respiratory distress and believed the picture fit pulmonary edema vs HAP. She also needed norepinephrine at the time. Antibiotics continued and she was placed on furosemide intermittently, doses held when her creatinine would rise. She was also given supplemental albumin with improvement in blood pressures and cessation of vasopressors.   Today, she was noted to be tachypneic prompting PCCM reconsultation. She is somnolent and follows only simple one step commands. Her nursing staff  says she has been like this at least for the past day or so. She refused BiPAP earlier today and is now on nasal cannula at 4-5L/min.   PAST MEDICAL HISTORY :  She  has a past medical history of Anxiety; Bilateral carotid artery stenosis; CAD (coronary artery disease); Chronic chest pain; Chronic headache; Depression; Diffuse large cell non-Hodgkin's lymphoma [facility]) (oncologist-  dr Marin Olp-  per last office note being monitored for possible recurrence); Dyspnea; GERD (gastroesophageal reflux disease); Hiatal hernia; History of breast cancer (oncologist-  dr Marin Olp--  no recurrence); History of colon polyps; History of colon polyps; History of panic attacks; History of small bowel obstruction; Hyperlipidemia; Hypertension; Low back pain; OSA (obstructive sleep apnea); Osteoarthritis; PMB (postmenopausal bleeding); S/P drug eluting coronary stent placement; and Thickened endometrium.  PAST SURGICAL HISTORY: She  has a past surgical history that includes Tonsillectomy; Inner ear surgery; Submandibular gland excision (Left, 05/10/2013); Portacath placement (06/04/13); Mastectomy with axillary lymph node dissection (Right, 01-03-2001); Cholecystectomy (2005); Cataract extraction w/ intraocular lens  implant, bilateral (2011); Knee arthroscopy (Right); Coronary angioplasty with stent (07-09-2009  dr Darnell Level brodie); Cardiac catheterization (07-15-2010   dr Kathlyn [place]); transthoracic echocardiogram (10-04-2006); Cardiovascular stress test (12-24-2009); Colonoscopy with esophagogastroduodenoscopy (egd) (11-12-2014); and ir generic historical (10/10/2015).   Subjective: Still with increase RR, bipap replaced   VITAL SIGNS: BP (!) 146/70   Pulse (!) 118   Temp 100.2 F (37.9 C)   Resp (!) 25   Ht 5\' 5"  (1.651 m)   Wt 93 kg (205 lb 0.4 oz)   SpO2 (!) 62%   BMI 34.12 kg/m    INTAKE / OUTPUT: I/O last 3  completed shifts: In: 840 [I.V.:340; IV Piggyback:500] Out: 460 [Urine:460]  PHYSICAL  EXAMINATION: General:  Drowsy, severe distress on BIPAP Neuro:  Follows simple comands HEENT:  Dry oral mucosa, anicteric eyes Cardiovascular:  s1 s2 RRT LUNG: ronchi diffuse Abdomen:  Distended, nontender, no rebound or gaurding Musculoskeletal:  All extremities have 2 pitting edema present Skin:  No major rashes seen  LABS:  BMET  Recent Labs Lab 10/10/15 1035 10/11/15 1800 10/12/15 0400 10/12/15 0500  NA 132* 133*  --  134*  K 4.7 5.0  --  5.1  CL 106 108  --  108  CO2 19* 16*  --  16*  BUN 47* 57*  --  66*  CREATININE 1.69* 2.26* 2.46* 2.44*  GLUCOSE 103* 119*  --  90    Electrolytes  Recent Labs Lab 10/10/15 1035 10/11/15 1800 10/12/15 0500  CALCIUM 8.1* 7.8* 7.8*  PHOS  --   --  5.2*    CBC  Recent Labs Lab 10/10/15 1035 10/11/15 0427 10/12/15 0400  WBC 23.0* 26.3* 29.0*  HGB 8.2* 7.4* 6.9*  HCT 24.9* 22.7* 21.4*  PLT 274 240 200    Coag's No results for input(s): APTT, INR in the last 168 hours.  Sepsis Markers  Recent Labs Lab 10/06/15 0338 10/11/15 1102 10/11/15 1725 10/11/15 2025  LATICACIDVEN  --   --  0.9 1.0  PROCALCITON 0.69 2.39  --   --     ABG  Recent Labs Lab 10/11/15 1615 10/11/15 2012 10/12/15 0140  PHART 7.422 7.289* 7.310*  PCO2ART 21.1* 35.5 29.2*  PO2ART 96.8 56.2* 73.1*    Liver Enzymes  Recent Labs Lab 10/07/15 0441 10/10/15 1035 10/11/15 1800 10/12/15 0500  AST 96* 158* 147*  --   ALT 123* 138* 102*  --   ALKPHOS 183* 226* 188*  --   BILITOT 0.7 0.4 0.6  --   ALBUMIN 1.8* 1.8* 2.4* 2.9*    Cardiac Enzymes  Recent Labs Lab 10/05/15 1000  TROPONINI 0.04*    Glucose No results for input(s): GLUCAP in the last 168 hours.  Imaging Dg Chest Port 1 View  Result Date: 10/11/2015 CLINICAL DATA:  Shortness of breath. History of coronary artery disease, sleep apnea, and breast cancer. Nonsmoker. EXAM: PORTABLE CHEST 1 VIEW COMPARISON:  10/09/2015 FINDINGS: The left PICC catheter has been  repositioned. The tip is now in the low SVC region. No pneumothorax. Cardiac enlargement with central pulmonary vascular congestion. Diffuse interstitial pattern to the lungs likely represents edema. No blunting of costophrenic angles. Calcification of the aorta. Degenerative changes in spine shoulder. IMPRESSION: Left PICC line appears in satisfactory position with tip overlying the low SVC region. Persistent cardiac enlargement, pulmonary vascular congestion, and bilateral edema. Electronically Signed   By: Lucienne Capers M.D.   On: 10/11/2015 18:06     STUDIES:  CXRs and CT Chest reviewed from this admission  CULTURES: Blood and sputum cultures from yesterday pending 8/16 blood cultures negative x2  ANTIBIOTICS: Vancomycin Imipenem  LINES/TUBES: Left PICC line   DISCUSSION: [name] is a 80 y.o. female with distant breast cancer history, non-Hodgkin's lymphoma with suspected recurrence based on FDG avid uptake of hilar and mediastinal lymph nodes on PET scan 09/08/15, CAD and HFpEF (echo 08/25/15 suggesting mild PH), who is chronically debilitated who presented with arthralgias, sore throat, hypotension and possible right hand cellulitis. She was treated for septic (maybe mixed with HFpEF cardiogenic shock) with IV fluids and antibiotics initially with improvement but has  had a plateau-ing of her recovery. She is newly febrile once more since 10/10/15 and had her Vanc/Cefepime expanded to Vanc/Imipenem with repeat cultures pending. PCCM was consulted in light of respiratory distress.  ASSESSMENT / PLAN:  PULMONARY A: Acute on chronic hypoxic respiratory failure - etiologies include acute on chronic pulmonary interstitial edema, possible HAP on arrival which should be treated.  R/o reoccurrence lymphangitic spread vs infectious P:   Severe distress this am  Failed BIPAP Place ETT, 8 cc/kg, likely then to reduce TV to keep plat less 30, 100%, peep 5 - abg pcxr  Bronch assessment  diagnostic CT reviewed, may need repeat Will rate 22, then abg  CARDIOVASCULAR A:  HFpEF Resolved septic shock but new fevers P:  Even to neg balance goals, pending tolerability to diuretics post intubation Tele Ensure trop x 1  RENAL A:   Worsening acute kidney injury - likely multifactorial from multiple antibiotics, HFpEF and diuresis. Also has a non-anion gap metabolic acidosis (lactate 0.9) most likely renal failure related P:   bmet in afternoon post correction ph with vent, to check k  May just hold lasix for now, pending bronch findings Chem in am  kvo  GASTROINTESTINAL A:   GERD P:   NPO Feed post ett Get LFT in am  Ensure ppi  HEMATOLOGIC A:   Hodgkin's lymphoma, maybe recurrent, hilar and mediastinal lymphadenopathy on PET in July. P:  Follws with Dr. Martha Clan.  Will also bronch and send cytology Lovenox, dc renal failure, add sub q hep  INFECTIOUS A:   Presumptive HAP treated New fevers, leukocytosis concerning for new infection P:   Maintain current abx ETT then bronch given immune status and concerns etiology worsening despite treatment Calling son for consent  ENDOCRINE A:   AT risk hyperglycemia   P:   CBG q 4. Sliding scale.   NEUROLOGIC A:   Delirium - ddx includes uremia, sepsis, medications Anxiety and depression P:   Delirium precautions Dc ativan ETT, then fent drip most likely needed   FAMILY  - Inter-disciplinary family meet or Palliative Care meeting due by:  10/11/15  Ccm time 40 min   [name]. Titus Mould, MD, [place] Pgr: [facility]

## 2015-10-12 NOTE — Procedures (Signed)
Intubation Procedure Note [name] [id number] Aug 20, 1935  Procedure: Intubation Indications: Respiratory insufficiency  Procedure Details Consent: Risks of procedure as well as the alternatives and risks of each were explained to the (patient/caregiver).  Consent for procedure obtained. Time Out: Verified patient identification, verified procedure, site/side was marked, verified correct patient position, special equipment/implants available, medications/allergies/relevent history reviewed, required imaging and test results available.  Performed  Maximum sterile technique was used including cap, gloves, gown, hand hygiene and mask.  MAC and 3    Evaluation Hemodynamic Status: BP stable throughout; O2 sats: stable throughout Patient's Current Condition: stable Complications: No apparent complications Patient did tolerate procedure well. Chest X-ray ordered to verify placement.  CXR: tube position acceptable.   Lamonte Sakai 10/12/2015

## 2015-10-12 NOTE — Progress Notes (Signed)
CRITICAL VALUE ALERT  Critical value received:  Hgb 6.9  Date of notification:  10/12/2015  Time of notification:  0430  Critical value read back: Yes   Nurse who received alert:  Daleen Squibb  MD notified (1st page):  Elink  Time of first page:  0450  MD notified (2nd page):  Time of second page:  Responding MD:  Dr. Elsworth Soho  Time MD responded:  [phone]

## 2015-10-12 NOTE — Progress Notes (Addendum)
Critical value of hemoglobin 6.9 reported to Kennis Carina, Therapist, sports. RN communicated critical value to Claudette Laws., RN, primary care nurse of the patient.

## 2015-10-12 NOTE — Progress Notes (Signed)
INFECTIOUS DISEASE PROGRESS NOTE  ID: [name] is a 80 y.o. female with  Principal Problem:   Leukocytosis Active Problems:   Coronary atherosclerosis   Anasarca   History of B-cell lymphoma   Abnormal chest x-ray   Cellulitis   Joint pain   Sore throat   Acute hypoxemic respiratory failure (HCC)   HCAP (healthcare-associated pneumonia)   Pressure ulcer   AKI (acute kidney injury) ([place])   Protein-calorie malnutrition, severe   Fever, unspecified  Subjective: intbx this AM.  Sedated  Abtx:  Anti-infectives    Start     Dose/Rate Route Frequency Ordered Stop   10/12/15 1000  imipenem-cilastatin (PRIMAXIN) 500 mg in sodium chloride 0.9 % 100 mL IVPB     500 mg 200 mL/hr over 30 Minutes Intravenous Every 12 hours 10/12/15 0916     10/11/15 2200  ceFEPIme (MAXIPIME) 1 g in dextrose 5 % 50 mL IVPB  Status:  Discontinued     1 g 100 mL/hr over 30 Minutes Intravenous Every 12 hours 10/11/15 1029 10/11/15 1431   10/11/15 1600  imipenem-cilastatin (PRIMAXIN) 1,000 mg in sodium chloride 0.9 % 250 mL IVPB  Status:  Discontinued     1,000 mg 250 mL/hr over 60 Minutes Intravenous Every 8 hours 10/11/15 1437 10/12/15 0910   10/10/15 1800  vancomycin (VANCOCIN) 1,250 mg in sodium chloride 0.9 % 250 mL IVPB     1,250 mg 166.7 mL/hr over 90 Minutes Intravenous Every 24 hours 10/10/15 1732     10/06/15 1600  ceFEPIme (MAXIPIME) 1 g in dextrose 5 % 50 mL IVPB  Status:  Discontinued     1 g 100 mL/hr over 30 Minutes Intravenous Every 8 hours 10/06/15 1042 10/11/15 1029   10/05/15 1100  vancomycin (VANCOCIN) IVPB 750 mg/150 ml premix  Status:  Discontinued     750 mg 150 mL/hr over 60 Minutes Intravenous 2 times daily 10/05/15 1007 10/06/15 1012   10/04/15 1800  ceFEPIme (MAXIPIME) 1 g in dextrose 5 % 50 mL IVPB  Status:  Discontinued     1 g 100 mL/hr over 30 Minutes Intravenous Every 12 hours 10/04/15 1216 10/06/15 1042   10/14/2015 2230  vancomycin (VANCOCIN) 1,250 mg in sodium  chloride 0.9 % 250 mL IVPB  Status:  Discontinued     1,250 mg 166.7 mL/hr over 90 Minutes Intravenous Every 12 hours 09/22/2015 2213 10/03/15 2254   09/25/2015 2200  ceFEPIme (MAXIPIME) 1 g in dextrose 5 % 50 mL IVPB  Status:  Discontinued     1 g 100 mL/hr over 30 Minutes Intravenous Every 8 hours 10/10/2015 2159 10/04/15 1216      Medications:  Scheduled: . amitriptyline  25 mg Oral QHS  . aspirin EC  81 mg Oral Q breakfast  . atorvastatin  80 mg Oral Q breakfast  . budesonide (PULMICORT) nebulizer solution  0.5 mg Nebulization BID  . cholecalciferol  1,000 Units Oral q morning - 10a  . feeding supplement  1 Container Oral TID BM  . [START ON 10/13/2015] feeding supplement (PRO-STAT SUGAR FREE 64)  30 mL Per Tube Daily  . fentaNYL      . furosemide  80 mg Intravenous Once  . heparin subcutaneous  5,000 Units Subcutaneous Q8H  . imipenem-cilastatin  500 mg Intravenous Q12H  . insulin aspart  0-9 Units Subcutaneous Q4H  . midazolam      . pantoprazole  40 mg Oral Q1200  . polyethylene glycol  17 g Oral  Daily  . sodium chloride  250 mL Intravenous Once  . vancomycin  1,250 mg Intravenous Q24H    Objective: Vital signs in last 24 hours: Temp:  [98.1 F (36.7 C)-101.6 F (38.7 C)] 99 F (37.2 C) (08/27 1500) Pulse Rate:  [61-122] 102 (08/27 1500) Resp:  [16-36] 17 (08/27 1500) BP: (80-146)/(35-91) 102/45 (08/27 1500) SpO2:  [62 %-100 %] 94 % (08/27 1500) FiO2 (%):  [40 %-100 %] 40 % (08/27 1400)   General appearance: no distress Resp: rhonchi bilaterally Cardio: regular rate and rhythm GI: abnormal findings:  distended and hypoactive bowel sounds  Lab Results  Recent Labs  10/11/15 0427 10/11/15 1800 10/12/15 0400 10/12/15 0500  WBC 26.3*  --  29.0*  --   HGB 7.4*  --  6.9*  --   HCT 22.7*  --  21.4*  --   NA  --  133*  --  134*  K  --  5.0  --  5.1  CL  --  108  --  108  CO2  --  16*  --  16*  BUN  --  57*  --  66*  CREATININE  --  2.26* 2.46* 2.44*   Liver  Panel  Recent Labs  10/10/15 1035 10/11/15 1800 10/12/15 0500  PROT 4.8* 4.8*  --   ALBUMIN 1.8* 2.4* 2.9*  AST 158* 147*  --   ALT 138* 102*  --   ALKPHOS 226* 188*  --   BILITOT 0.4 0.6  --    Sedimentation Rate No results for input(s): ESRSEDRATE in the last 72 hours. C-Reactive Protein No results for input(s): CRP in the last 72 hours.  Microbiology: Recent Results (from the past 240 hour(s))  MRSA PCR Screening     Status: None   Collection Time: 10/04/15  5:02 PM  Result Value Ref Range Status   MRSA by PCR NEGATIVE NEGATIVE Final    Comment:        The GeneXpert MRSA Assay (FDA approved for NASAL specimens only), is one component of a comprehensive MRSA colonization surveillance program. It is not intended to diagnose MRSA infection nor to guide or monitor treatment for MRSA infections.     Studies/Results: Dg Chest Port 1 View  Result Date: 10/12/2015 CLINICAL DATA:  Evaluate ETT placement EXAM: PORTABLE CHEST 1 VIEW COMPARISON:  10/11/15 FINDINGS: ET tube tip is situated above the carina. There is a left arm PICC line with tip overlying the mediastinum. Stable cardiac enlargement. Persistent pulmonary edema. Small bilateral pleural effusions are unchanged. IMPRESSION: 1. No change in pulmonary edema pattern compared with previous exam. Electronically Signed   By: Kerby Moors M.D.   On: 10/12/2015 09:20   Dg Chest Port 1 View  Result Date: 10/11/2015 CLINICAL DATA:  Shortness of breath. History of coronary artery disease, sleep apnea, and breast cancer. Nonsmoker. EXAM: PORTABLE CHEST 1 VIEW COMPARISON:  10/09/2015 FINDINGS: The left PICC catheter has been repositioned. The tip is now in the low SVC region. No pneumothorax. Cardiac enlargement with central pulmonary vascular congestion. Diffuse interstitial pattern to the lungs likely represents edema. No blunting of costophrenic angles. Calcification of the aorta. Degenerative changes in spine shoulder.  IMPRESSION: Left PICC line appears in satisfactory position with tip overlying the low SVC region. Persistent cardiac enlargement, pulmonary vascular congestion, and bilateral edema. Electronically Signed   By: Lucienne Capers M.D.   On: 10/11/2015 18:06   Dg Abd Portable 1v  Result Date: 10/12/2015 CLINICAL DATA:  NG tube placement EXAM: PORTABLE ABDOMEN - 1 VIEW COMPARISON:  None. FINDINGS: NG tube tip is in the proximal to mid stomach. Diffuse airspace disease noted throughout the lungs. Cardiomegaly. Probable small left effusion. IMPRESSION: NG tube tip in the proximal to mid stomach. Electronically Signed   By: Rolm Baptise M.D.   On: 10/12/2015 10:41     Assessment/Plan: Breast CA Non-Hodgkin's Lymphoma (possible recurrence) CAD, HTN Fever, Leukocytosis, hypotension Fluid overload (+90 today) Cr worsening Respiratory Failure  Bronch with plugging, BAL sent  WBC increasing Will change vanco to zyvox Await BAL studies diuresis  Total days of antibiotics 14                                                                   Day 1 imipenem, Day 2 vanco         Bobby Rumpf Infectious Diseases (pager) [phone] [email or web address] 10/12/2015, 3:25 PM  LOS: 10 days

## 2015-10-12 NOTE — Procedures (Signed)
Central Venous Catheter Insertion Procedure Note [name] Myhre [id number] 1935-08-12  Procedure: Insertion of Central Venous Catheter Indications: Assessment of intravascular volume, Drug and/or fluid administration and Frequent blood sampling  Procedure Details Consent: Risks of procedure as well as the alternatives and risks of each were explained to the (patient/caregiver).  Consent for procedure obtained. Time Out: Verified patient identification, verified procedure, site/side was marked, verified correct patient position, special equipment/implants available, medications/allergies/relevent history reviewed, required imaging and test results available.  Performed  Maximum sterile technique was used including antiseptics, cap, gloves, gown, hand hygiene, mask and sheet. Skin prep: Chlorhexidine; local anesthetic administered A antimicrobial bonded/coated triple lumen catheter was placed in the right internal jugular vein using the Seldinger technique. Ultrasound guidance used.Yes.   Catheter placed to 17 cm. Blood aspirated via all 3 ports and then flushed x 3. Line sutured x 2 and dressing applied.  Evaluation Blood flow good Complications: No apparent complications Patient did tolerate procedure well. Chest X-ray ordered to verify placement.  CXR: pending.  Georgann Housekeeper, AGACNP-BC [facility] Pager [phone] or [phone]  10/12/2015 8:16 PM

## 2015-10-13 DIAGNOSIS — A419 Sepsis, unspecified organism: Secondary | ICD-10-CM

## 2015-10-13 DIAGNOSIS — R6521 Severe sepsis with septic shock: Secondary | ICD-10-CM

## 2015-10-13 DIAGNOSIS — Z515 Encounter for palliative care: Secondary | ICD-10-CM

## 2015-10-13 LAB — TYPE AND SCREEN
ABO/RH(D): A POS
Antibody Screen: NEGATIVE
UNIT DIVISION: 0

## 2015-10-13 LAB — GLUCOSE, CAPILLARY
GLUCOSE-CAPILLARY: 272 mg/dL — AB (ref 65–99)
Glucose-Capillary: 193 mg/dL — ABNORMAL HIGH (ref 65–99)
Glucose-Capillary: 189 mg/dL — ABNORMAL HIGH (ref 65–99)
Glucose-Capillary: 250 mg/dL — ABNORMAL HIGH (ref 65–99)
Glucose-Capillary: 313 mg/dL — ABNORMAL HIGH (ref 65–99)
Glucose-Capillary: 213 mg/dL — ABNORMAL HIGH (ref 65–99)

## 2015-10-13 LAB — CBC WITH DIFFERENTIAL/PLATELET
Basophils Absolute: 0 10*3/uL (ref 0.0–0.1)
Basophils Relative: 0 %
Eosinophils Absolute: 4 10*3/uL — ABNORMAL HIGH (ref 0.0–0.7)
Eosinophils Relative: 11 %
HCT: 28.2 % — ABNORMAL LOW (ref 36.0–46.0)
Hemoglobin: 9.2 g/dL — ABNORMAL LOW (ref 12.0–15.0)
Lymphocytes Relative: 4 %
Lymphs Abs: 1.5 10*3/uL (ref 0.7–4.0)
MCH: 26.6 pg (ref 26.0–34.0)
MCHC: 32.6 g/dL (ref 30.0–36.0)
MCV: 81.5 fL (ref 78.0–100.0)
Monocytes Absolute: 1.1 10*3/uL — ABNORMAL HIGH (ref 0.1–1.0)
Monocytes Relative: 3 %
Neutro Abs: 30 10*3/uL — ABNORMAL HIGH (ref 1.7–7.7)
Neutrophils Relative %: 82 %
Platelets: 190 10*3/uL (ref 150–400)
RBC: 3.46 MIL/uL — ABNORMAL LOW (ref 3.87–5.11)
RDW: 18.6 % — ABNORMAL HIGH (ref 11.5–15.5)
WBC: 36.6 10*3/uL — ABNORMAL HIGH (ref 4.0–10.5)

## 2015-10-13 LAB — RENAL FUNCTION PANEL
Albumin: 2.4 g/dL — ABNORMAL LOW (ref 3.5–5.0)
Anion gap: 11 (ref 5–15)
BUN: 70 mg/dL — ABNORMAL HIGH (ref 6–20)
CO2: 14 mmol/L — ABNORMAL LOW (ref 22–32)
Calcium: 7.4 mg/dL — ABNORMAL LOW (ref 8.9–10.3)
Chloride: 108 mmol/L (ref 101–111)
Creatinine, Ser: 2.95 mg/dL — ABNORMAL HIGH (ref 0.44–1.00)
GFR calc Af Amer: 16 mL/min — ABNORMAL LOW (ref >60)
GFR calc non Af Amer: 14 mL/min — ABNORMAL LOW (ref >60)
Glucose, Bld: 226 mg/dL — ABNORMAL HIGH (ref 65–99)
Phosphorus: 5.2 mg/dL — ABNORMAL HIGH (ref 2.5–4.6)
Potassium: 5.4 mmol/L — ABNORMAL HIGH (ref 3.5–5.1)
Sodium: 133 mmol/L — ABNORMAL LOW (ref 135–145)

## 2015-10-13 LAB — COMPREHENSIVE METABOLIC PANEL
ALK PHOS: 232 U/L — AB (ref 38–126)
ALT: 65 U/L — AB (ref 14–54)
AST: 159 U/L — AB (ref 15–41)
Albumin: 2.4 g/dL — ABNORMAL LOW (ref 3.5–5.0)
Anion gap: 8 (ref 5–15)
BILIRUBIN TOTAL: 0.6 mg/dL (ref 0.3–1.2)
BUN: 71 mg/dL — AB (ref 6–20)
CALCIUM: 7.4 mg/dL — AB (ref 8.9–10.3)
CO2: 15 mmol/L — ABNORMAL LOW (ref 22–32)
CREATININE: 2.92 mg/dL — AB (ref 0.44–1.00)
Chloride: 108 mmol/L (ref 101–111)
GFR calc Af Amer: 17 mL/min — ABNORMAL LOW (ref >60)
GFR, EST NON AFRICAN AMERICAN: 14 mL/min — AB (ref >60)
GLUCOSE: 229 mg/dL — AB (ref 65–99)
POTASSIUM: 5.1 mmol/L (ref 3.5–5.1)
Sodium: 131 mmol/L — ABNORMAL LOW (ref 135–145)
TOTAL PROTEIN: 4.9 g/dL — AB (ref 6.5–8.1)

## 2015-10-13 LAB — BLOOD GAS, ARTERIAL
ACID-BASE DEFICIT: 13.1 mmol/L — AB (ref 0.0–2.0)
BICARBONATE: 13.6 meq/L — AB (ref 20.0–24.0)
DRAWN BY: 103701
FIO2: 40
LHR: 30 {breaths}/min
O2 Saturation: 93.6 %
PEEP: 5 cmH2O
Patient temperature: 102.4
TCO2: 13.3 mmol/L (ref 0–100)
VT: 400 mL
pCO2 arterial: 39.9 mmHg (ref 35.0–45.0)
pH, Arterial: 7.174 — CL (ref 7.350–7.450)
pO2, Arterial: 93.8 mmHg (ref 80.0–100.0)

## 2015-10-13 LAB — MAGNESIUM
MAGNESIUM: 1.7 mg/dL (ref 1.7–2.4)
Magnesium: 1.6 mg/dL — ABNORMAL LOW (ref 1.7–2.4)

## 2015-10-13 LAB — URINE CULTURE: Culture: >=100000 — AB

## 2015-10-13 LAB — PHOSPHORUS
Phosphorus: 5.1 mg/dL — ABNORMAL HIGH (ref 2.5–4.6)
Phosphorus: 4.7 mg/dL — ABNORMAL HIGH (ref 2.5–4.6)

## 2015-10-13 LAB — PROCALCITONIN: Procalcitonin: 3.79 ng/mL

## 2015-10-13 LAB — TROPONIN I: Troponin I: 0.08 ng/mL (ref <0.03)

## 2015-10-13 MED ORDER — SODIUM BICARBONATE 8.4 % IV SOLN
100.0000 meq | Freq: Once | INTRAVENOUS | Status: AC
  Administered 2015-10-13: 100 meq via INTRAVENOUS
  Filled 2015-10-13: qty 50

## 2015-10-13 MED ORDER — SODIUM CHLORIDE 0.9% FLUSH: 10.0000 mL | INTRAVENOUS | Status: DC | PRN

## 2015-10-13 MED ORDER — ORAL CARE MOUTH RINSE
15.0000 mL | Freq: Two times a day (BID) | OROMUCOSAL | Status: DC
  Administered 2015-10-13 – 2015-10-15 (×5): 15 mL via OROMUCOSAL

## 2015-10-13 MED ORDER — SODIUM BICARBONATE 8.4 % IV SOLN
INTRAVENOUS | Status: DC
  Administered 2015-10-13 – 2015-10-15 (×4): via INTRAVENOUS
  Filled 2015-10-13 (×4): qty 150

## 2015-10-13 MED ORDER — PANTOPRAZOLE SODIUM 40 MG PO PACK
40.0000 mg | PACK | Freq: Every day | ORAL | Status: DC
  Administered 2015-10-13 – 2015-10-15 (×3): 40 mg
  Filled 2015-10-13 (×3): qty 20

## 2015-10-13 MED ORDER — INSULIN GLARGINE 100 UNIT/ML ~~LOC~~ SOLN
10.0000 [IU] | Freq: Every day | SUBCUTANEOUS | Status: DC
  Administered 2015-10-13 – 2015-10-15 (×3): 10 [IU] via SUBCUTANEOUS
  Filled 2015-10-13 (×3): qty 0.1

## 2015-10-13 MED ORDER — SODIUM CHLORIDE 0.9 % IV SOLN
200.0000 mg | Freq: Once | INTRAVENOUS | Status: AC
  Administered 2015-10-13: 200 mg via INTRAVENOUS
  Filled 2015-10-13: qty 200

## 2015-10-13 MED ORDER — SODIUM BICARBONATE 8.4 % IV SOLN
INTRAVENOUS | Status: AC
  Filled 2015-10-13: qty 50

## 2015-10-13 MED ORDER — VASOPRESSIN 20 UNIT/ML IV SOLN
0.0300 [IU]/min | INTRAVENOUS | Status: DC
  Administered 2015-10-13 – 2015-10-15 (×3): 0.03 [IU]/min via INTRAVENOUS
  Filled 2015-10-13 (×3): qty 2

## 2015-10-13 MED ORDER — CHLORHEXIDINE GLUCONATE 0.12 % MT SOLN
15.0000 mL | Freq: Two times a day (BID) | OROMUCOSAL | Status: DC
  Administered 2015-10-13 – 2015-10-15 (×5): 15 mL via OROMUCOSAL
  Filled 2015-10-13 (×2): qty 15

## 2015-10-13 MED ORDER — NOREPINEPHRINE BITARTRATE 1 MG/ML IV SOLN
2.0000 ug/min | INTRAVENOUS | Status: DC
  Administered 2015-10-13 (×2): 60 ug/min via INTRAVENOUS
  Administered 2015-10-13: 30 ug/min via INTRAVENOUS
  Administered 2015-10-13: 60 ug/min via INTRAVENOUS
  Administered 2015-10-14: 43 ug/min via INTRAVENOUS
  Administered 2015-10-14: 40 ug/min via INTRAVENOUS
  Filled 2015-10-13 (×10): qty 16

## 2015-10-13 MED ORDER — SODIUM CHLORIDE 0.9 % IV SOLN
100.0000 mg | INTRAVENOUS | Status: DC
  Administered 2015-10-14 – 2015-10-15 (×2): 100 mg via INTRAVENOUS
  Filled 2015-10-13 (×2): qty 100

## 2015-10-13 NOTE — Progress Notes (Signed)
eLink Physician-Brief Progress Note Patient Name: Robertine Phung Kaseman DOB: [birth year] MRN: [id number]   Date of Service  10/13/2015  HPI/Events of Note  Hypotension - BP = 79/42. CVP = 14. Hgb = 9.2. Already on Norepinephrine IV infusion at 50 mcg/min.   eICU Interventions  Will order: 1. Increase ceiling on Norepinephrine IV infusion to 70 mcg/min. 2. Vasopressin IV infusion at shock dose.  3. ABG now.      Intervention Category Major Interventions: Hypotension - evaluation and management  Kynan Peasley [place] 10/13/2015, 6:25 AM

## 2015-10-13 NOTE — Progress Notes (Signed)
[facility]ious [facility]    Date of Admission:  09/18/2015   Total days of antibiotics 13        Day 3 imi        Day 2 linezolid           ID: Marieta J Harth is a 80 y.o. female with hx of MM initially admitted with sore throat, myalgias who was treated HCAP with cefepime but clinically worsened on day #10 of abtx. She started to have worsening tachypnea then intubated on 8/27. High fevers since 8/25.  Principal Problem:   Leukocytosis Active Problems:   Coronary atherosclerosis   Anasarca   History of B-cell lymphoma   Abnormal chest x-ray   Cellulitis   Joint pain   Sore throat   Acute hypoxemic respiratory failure (HCC)   HCAP (healthcare-associated pneumonia)   Pressure ulcer   AKI (acute kidney injury) (HCC)   Protein-calorie malnutrition, severe   Fever, unspecified   Poor venous access    Subjective: Continues to have high fevers up to 102.20F, hemodynamically unstable requiring 2 pressors. Bronchoscopy yesterday. Leukocytosis continues to increase up to 36.6  Medications:  . [START ON 10/14/2015] anidulafungin  100 mg Intravenous Q24H  . aspirin EC  81 mg Oral Q breakfast  . budesonide (PULMICORT) nebulizer solution  0.5 mg Nebulization BID  . chlorhexidine  15 mL Mouth Rinse BID  . cholecalciferol  1,000 Units Oral q morning - 10a  . feeding supplement (PRO-STAT SUGAR FREE 64)  30 mL Per Tube Daily  . furosemide  80 mg Intravenous Once  . heparin subcutaneous  5,000 Units Subcutaneous Q8H  . imipenem-cilastatin  500 mg Intravenous Q12H  . insulin aspart  0-9 Units Subcutaneous Q4H  . insulin glargine  10 Units Subcutaneous Daily  . linezolid (ZYVOX) IV  600 mg Intravenous Q12H  . mouth rinse  15 mL Mouth Rinse q12n4p  . pantoprazole sodium  40 mg Per Tube Q1200  . polyethylene glycol  17 g Oral Daily  . sodium chloride  250 mL Intravenous Once    Objective: Vital signs in last 24 hours: Temp:  [99 F (37.2 C)-102.4 F (39.1 C)] 101.3 F  (38.5 C) (08/28 1300) Pulse Rate:  [90-122] 117 (08/28 1300) Resp:  [0-36] 27 (08/28 1300) BP: (79-116)/(24-99) 99/60 (08/28 1300) SpO2:  [93 %-100 %] 98 % (08/28 1300) Arterial Line BP: (78-128)/(39-59) 108/53 (08/28 1300) FiO2 (%):  [40 %] 40 % (08/28 1350) Physical Exam  Constitutional:  Intubate and sedated appears chronically and well-nourished. No distress.  HENT: [place]/AT,  Mouth/Throat: intubated Cardiovascular: tachy, regular rhythm and normal heart sounds. Exam reveals no gallop and no friction rub.  No murmur heard.  Pulmonary/Chest: Effort normal and breath sounds normal. No respiratory distress.  has no wheezes.  Abdominal: Soft, Bowel sounds are decreased. exhibits no distension. There is no tenderness.  Ext: anasarca diffuse Skin: Skin is warm and dry. No rash noted. No erythema.    Lab Results  Recent Labs  10/12/15 1600 10/12/15 1607 10/13/15 0500  WBC 24.2*  --  36.6*  HGB 8.2*  --  9.2*  HCT 25.3*  --  28.2*  NA  --  134* 133*  131*  K  --  5.2* 5.4*  5.1  CL  --  110 108  108  CO2  --  15* 14*  15*  BUN  --  66* 70*  71*  CREATININE  --  2.76* 2.95*  2.92*   Liver Panel  Recent Labs  10/11/15 1800 10/12/15 0500 10/13/15 0500  PROT 4.8*  --  4.9*  ALBUMIN 2.4* 2.9* 2.4*  2.4*  AST 147*  --  159*  ALT 102*  --  65*  ALKPHOS 188*  --  232*  BILITOT 0.6  --  0.6   Microbiology: 8/26 urine cx yeast 8/28 blood cx ngtd Studies/Results: Dg Chest Port 1 View  Result Date: 10/12/2015 CLINICAL DATA:  Respiratory distress. Evaluate central line placement. EXAM: PORTABLE CHEST 1 VIEW COMPARISON:  Earlier the same day. FINDINGS: Patient is markedly rotated to the left on this study obtained at 2029 hours. Endotracheal tube projects over the mediastinal. The NG tube passes into the stomach although the distal tip position is not included on the film. Right presumably IJ central line is superimposed on the spine. The tip of the catheter is below the  level of the carina and is overlying a position that could be compatible with the mid SVC. Vascular congestion with pulmonary edema pattern again noted. The cardio pericardial silhouette is enlarged. Telemetry leads overlie the chest. IMPRESSION: 1. Markedly rotated film without evidence for pneumothorax. 2. Presumed right IJ central line overlies the spine with the tip below the carina overlying a location compatible with mid SVC. 3. Stable cardiomegaly with pulmonary edema pattern. Electronically Signed   By: Misty Stanley M.D.   On: 10/12/2015 21:18   Dg Chest Port 1 View  Result Date: 10/12/2015 CLINICAL DATA:  Evaluate ETT placement EXAM: PORTABLE CHEST 1 VIEW COMPARISON:  10/11/15 FINDINGS: ET tube tip is situated above the carina. There is a left arm PICC line with tip overlying the mediastinum. Stable cardiac enlargement. Persistent pulmonary edema. Small bilateral pleural effusions are unchanged. IMPRESSION: 1. No change in pulmonary edema pattern compared with previous exam. Electronically Signed   By: Kerby Moors M.D.   On: 10/12/2015 09:20   Dg Chest Port 1 View  Result Date: 10/11/2015 CLINICAL DATA:  Shortness of breath. History of coronary artery disease, sleep apnea, and breast cancer. Nonsmoker. EXAM: PORTABLE CHEST 1 VIEW COMPARISON:  10/09/2015 FINDINGS: The left PICC catheter has been repositioned. The tip is now in the low SVC region. No pneumothorax. Cardiac enlargement with central pulmonary vascular congestion. Diffuse interstitial pattern to the lungs likely represents edema. No blunting of costophrenic angles. Calcification of the aorta. Degenerative changes in spine shoulder. IMPRESSION: Left PICC line appears in satisfactory position with tip overlying the low SVC region. Persistent cardiac enlargement, pulmonary vascular congestion, and bilateral edema. Electronically Signed   By: Lucienne Capers M.D.   On: 10/11/2015 18:06   Dg Abd Portable 1v  Result Date:  10/12/2015 CLINICAL DATA:  NG tube placement EXAM: PORTABLE ABDOMEN - 1 VIEW COMPARISON:  None. FINDINGS: NG tube tip is in the proximal to mid stomach. Diffuse airspace disease noted throughout the lungs. Cardiomegaly. Probable small left effusion. IMPRESSION: NG tube tip in the proximal to mid stomach. Electronically Signed   By: Rolm Baptise M.D.   On: 10/12/2015 10:41     Assessment/Plan: Sepsis in immunocompromised host = recommend to start anidulafungin to cover invasive fungal infection. She has not had any cultures suggestive of fungal infection, except most recent urine collection found to have yeast. Continue on linezolid and imipenem until bronch lavage culture returns in next 48-72 hr.s. Will ask lab to add on afb cx (looking for nocardia) and fungal culture. Other possibility, could be CMV pneumonitis. Will check cmv ig  G, VL  aki = likely due to hypotension, defer to pulm critical care  Remains critical ill  [name] [facility] for Infectious Diseases Cell: [phone] Pager: [phone]  10/13/2015, 3:01 PM

## 2015-10-13 NOTE — Consult Note (Signed)
Consultation Note Date: 10/13/2015   Patient Name: [name]  DOB: 10/29/1935  MRN: [id number]  Age / Sex: 80 y.o., female  PCP: Lona Kettle, MD Referring Physician: [name], MD  Reason for Consultation: Establishing goals of care  HPI/Patient Profile: 80 y.o. female  with past medical history of distant breast cancer history, non-Hodgkin's lymphoma with suspected recurrence, CAD, diastolic CHF, who is chronically debilitated, recent admission 8/2-8/5 with UTI sepsis admitted on 10/16/2015 with joint pain, neck pain, sore throat. Developed septic shock with multiorgan failure with unclear source of infection.   Clinical Assessment and Goals of Care: [name] is sedated on vent. She opens eyes but does not track and does not follow any commands. No family at bedside. Per nursing staff she was discussing her poor QOL and that she was nearing the end and accepting of this fact prior to decompensation and intubation. She has been very ill and has had a very difficult adjustment to loosing her independence and moving into facility recently per notes. Following with Dr. Marin Olp for possible relapse of large cell non-Hodgkin's lymphoma. Relapse suspected this hospitalization although recent PET scan did not overtly show relapse she was having some functional decline and now acute decline and not improving. Having multiorgan failure requiring vent and vasopressor support as well as renal failure. Prognosis extremely poor and not felt she will survive this event at this time.   Per notes son is estranged but willing to make decisions for his mother. There is some documentation in the past about HCPOA other than son. Son has said he is documented HCPOA and will bring paperwork to hospital - will await these documents. I have called and reached out to son but no answer and I did leave voicemail. Will await return  call and will continue to reach [place].   HCPOA thought to be her son Research officer, political party - await formal documentation    SUMMARY OF RECOMMENDATIONS   Very poor prognosis - attempting to reach family to discuss further.   Code Status/Advance Care Planning:  Full code until DNR confirmed by HCPOA   Symptom Management:   Sedated on vent. No signs of distress or discomfort.   Constipation: Dulcolax supp daily prn.   Palliative Prophylaxis:   Aspiration, Bowel Regimen, Frequent Pain Assessment, Oral Care and Turn Reposition  Additional Recommendations (Limitations, Scope, Preferences):  I would support full comfort care at this point in her disease process if family desires.   Psycho-social/Spiritual:   Desire for further Chaplaincy support:no  Additional Recommendations: Grief/Bereavement Support  Prognosis:   Hours - Days  Discharge Planning: Anticipated Hospital Death      Primary Diagnoses: Present on Admission: . Leukocytosis . Coronary atherosclerosis . Anasarca   I have reviewed the medical record, interviewed the patient and family, and examined the patient. The following aspects are pertinent.  Past Medical History:  Diagnosis Date  . Anxiety    takes Ativan daily  . Bilateral carotid artery stenosis    per last duplex --  RICA and LICA  54-62-%  . CAD (coronary artery disease)    cardiologist-  dr Stanford Breed  . Chronic chest pain   . Chronic headache   . Depression    takes Elavil nightly  . Diffuse large cell non-Hodgkin's lymphoma [facility]) oncologist-  dr Marin Olp-  per last office note being monitored for possible recurrence   dx 03/ 2015 by lymph node bx/  Stage 3-  completed chemotherapy  . Dyspnea   . GERD (gastroesophageal reflux disease)    takes Nexium daily  . Hiatal hernia   . History of breast cancer oncologist-  dr Marin Olp--  no recurrence   dx 2002-- right breast Stage 1/2 lobular,  ER+/HER2 negative /  s/p  right mastectomy w/ node dissection  and chemotherapy  . History of colon polyps    hyperplastic polyp's  2003;  2006;  2011  . History of colon polyps    hyperplastic 2003,  2006,  2011  . History of panic attacks   . History of small bowel obstruction    08/ 2014  parital sbo  resolved without surgical intervention  . Hyperlipidemia    takes Atorvastatin daily  . Hypertension    takes Quinapril daily  . Low back pain   . OSA (obstructive sleep apnea)    mild per study 10-14-2007-  cpap intolerent, uses O2 supplement at night  . Osteoarthritis   . PMB (postmenopausal bleeding)   . S/P drug eluting coronary stent placement    pLAD 07-09-2009  . Thickened endometrium    Social History   Social History  . Marital status: Divorced    Spouse name: N/A  . Number of children: 2  . Years of education: N/A   Occupational History  . Unemployed Retired   Social History Main Topics  . Smoking status: Never Smoker  . Smokeless tobacco: Never Used  . Alcohol use No  . Drug use: No  . Sexual activity: Yes    Birth control/ protection: Post-menopausal   Other Topics Concern  . None   Social History Narrative  . None   History reviewed. No pertinent family history. Scheduled Meds: . [START ON 10/14/2015] anidulafungin  100 mg Intravenous Q24H  . anidulafungin  200 mg Intravenous Once  . aspirin EC  81 mg Oral Q breakfast  . budesonide (PULMICORT) nebulizer solution  0.5 mg Nebulization BID  . chlorhexidine  15 mL Mouth Rinse BID  . cholecalciferol  1,000 Units Oral q morning - 10a  . feeding supplement (PRO-STAT SUGAR FREE 64)  30 mL Per Tube Daily  . furosemide  80 mg Intravenous Once  . heparin subcutaneous  5,000 Units Subcutaneous Q8H  . imipenem-cilastatin  500 mg Intravenous Q12H  . insulin aspart  0-9 Units Subcutaneous Q4H  . insulin glargine  10 Units Subcutaneous Daily  . linezolid (ZYVOX) IV  600 mg Intravenous Q12H  . mouth rinse  15 mL Mouth Rinse q12n4p  . pantoprazole sodium  40 mg Per Tube  Q1200  . polyethylene glycol  17 g Oral Daily  . sodium chloride  250 mL Intravenous Once   Continuous Infusions: . feeding supplement (VITAL HIGH PROTEIN) Stopped (10/13/15 1319)  . fentaNYL infusion INTRAVENOUS 50 mcg/hr (10/13/15 1300)  . norepinephrine (LEVOPHED) Adult infusion 64.96 mcg/min (10/13/15 1300)  .  sodium bicarbonate  infusion 1000 mL 75 mL/hr at 10/13/15 1300  . vasopressin (PITRESSIN) infusion - *FOR SHOCK* 0.03 Units/min (10/13/15 1300)   PRN Meds:.Place/Maintain arterial line **AND**  sodium chloride, acetaminophen (TYLENOL) oral liquid 160 mg/5 mL, bisacodyl, docusate sodium, ipratropium-albuterol, phenol, senna, sodium chloride flush Medications Prior to Admission:  Prior to Admission medications   Medication Sig Start Date End Date Taking? Authorizing Provider  acetaminophen (TYLENOL) 500 MG tablet Take 1 tablet (500 mg total) by mouth every 6 (six) hours as needed (for pain.). 09/20/15  Yes Clanford Marisa Hua, MD  amitriptyline (ELAVIL) 100 MG tablet Take 100 mg by mouth at bedtime.   Yes Historical Provider, MD  aspirin 81 MG EC tablet Take 81 mg by mouth daily with breakfast.    Yes Historical Provider, MD  atorvastatin (LIPITOR) 80 MG tablet Take 1 tablet (80 mg total) by mouth daily. Patient taking differently: Take 80 mg by mouth daily with breakfast.  06/02/15  Yes Lelon Perla, MD  cholecalciferol (VITAMIN D) 1000 UNITS tablet Take 1,000 Units by mouth every morning.    Yes Historical Provider, MD  Cinnamon 500 MG capsule Take 1,000 mg by mouth 2 (two) times daily.   Yes Historical Provider, MD  docusate sodium (COLACE) 100 MG capsule Take 1 capsule (100 mg total) by mouth 2 (two) times daily as needed for mild constipation. 09/20/15  Yes Clanford Marisa Hua, MD  GARLIC PO Take 1 tablet by mouth daily with breakfast.   Yes Historical Provider, MD  LORazepam (ATIVAN) 0.5 MG tablet Take 1 tablet (0.5 mg total) by mouth 2 (two) times daily as needed for anxiety or  sleep. 09/20/15  Yes Clanford Marisa Hua, MD  LORazepam (ATIVAN) 2 MG tablet Take 2 mg by mouth 2 (two) times daily.   Yes Historical Provider, MD  Multiple Vitamin (MULTIVITAMIN WITH MINERALS) TABS tablet Take 1 tablet by mouth daily.   Yes Historical Provider, MD  nitroGLYCERIN (NITROSTAT) 0.4 MG SL tablet Place 1 tablet (0.4 mg total) under the tongue every 5 (five) minutes as needed for chest pain. 08/28/14  Yes Lelon Perla, MD  Omega-3 Fatty Acids (FISH OIL) 1000 MG CAPS Take 1,000 mg by mouth every morning.    Yes Historical Provider, MD  OXYGEN Inhale 2 L into the lungs continuous.   Yes Historical Provider, MD  polyethylene glycol powder (GLYCOLAX/MIRALAX) powder Take 17 g by mouth 2 (two) times daily as needed. Patient taking differently: Take 17 g by mouth daily.  05/21/14  Yes Posey Boyer, MD  quinapril (ACCUPRIL) 20 MG tablet Take 20 mg by mouth daily with breakfast.   Yes Historical Provider, MD   No Known Allergies Review of Systems  Unable to perform ROS: Acuity of condition    Physical Exam  Constitutional: She appears well-developed.  Cardiovascular: Tachycardia present.   Pulmonary/Chest: She has decreased breath sounds in the right lower field and the left lower field. She has rhonchi.  Abdominal: She exhibits distension.  Neurological:  Sedated on vent    Vital Signs: BP 99/60   Pulse (!) 117   Temp (!) 101.3 F (38.5 C)   Resp (!) 27   Ht '5\' 5"'$  (1.651 m)   Wt 93 kg (205 lb 0.4 oz)   SpO2 98%   BMI 34.12 kg/m  Pain Assessment: CPOT POSS *See Group Information*: 1-Acceptable,Awake and alert Pain Score: 0-No pain   SpO2: SpO2: 98 % O2 Device:SpO2: 98 % O2 Flow Rate: .O2 Flow Rate (L/min): 6 L/min  IO: Intake/output summary:  Intake/Output Summary (Last 24 hours) at 10/13/15 1441 Last data filed at 10/13/15 1300  Gross per 24 hour  Intake  3703.52 ml  Output              210 ml  Net          3493.52 ml    LBM: Last BM Date:  10/05/15 Baseline Weight: Weight: 93 kg (205 lb 0.4 oz) Most recent weight: Weight: 93 kg (205 lb 0.4 oz)     Palliative Assessment/Data:   Flowsheet Rows   Flowsheet Row Most Recent Value  Intake Tab  Referral Department  Critical care  Unit at Time of Referral  ICU  Palliative Care Primary Diagnosis  Cancer  Date Notified  10/13/15  Palliative Care Type  New Palliative care  Reason for referral  Clarify Goals of Care  Date of Admission  10/10/2015  Date first seen by Palliative Care  10/13/15  # of days Palliative referral response time  0 Day(s)  # of days IP prior to Palliative referral  12  Clinical Assessment  Psychosocial & Spiritual Assessment  Palliative Care Outcomes      Time In: 8984 Time Out: 1405 Time Total: 76mn Greater than 50%  of this time was spent counseling and coordinating care related to the above assessment and plan.  Signed by: PPershing Proud NP   Please contact Palliative Medicine Team phone at [phone] questions and concerns.  For individual provider: See AShea Evans

## 2015-10-13 NOTE — Progress Notes (Signed)
Nutrition Follow-up  DOCUMENTATION CODES:   Non-severe (moderate) malnutrition in context of acute illness/injury, Obesity unspecified  INTERVENTION:  VHP @ 53mL/hr via OGT 76mL prostat daily Permitted hypocaloric feedings Regimen provides 1300 calories, 120gm protein, and 1003cc free water  NUTRITION DIAGNOSIS:   Increased nutrient needs related to wound healing as evidenced by estimated needs. -Ongoing  GOAL:   Provide needs based on ASPEN/SCCM guidelines -Meeting  MONITOR:   Vent status, Labs, Weight trends, TF tolerance, Skin, I & O's  REASON FOR ASSESSMENT:   Consult Enteral/tube feeding initiation and management  ASSESSMENT:   [name] is a 80 y.o. woman with a remote history of breast cancer, Non-Hodgkin's lymphoma (followed by Dr. Marin Olp, patient reports recent PET scan x 2 suggestive of recurrent disease, due to see Dr. Marin Olp again in October), CAD, HTN, HLD, grade I diastolic dysfunction, and chronic debility (she was wheelchair bound even prior to being admitted to SNF after her last discharge earlier this month). During her last admission 8/2-8/5, she had severe sepsis attributed to UTI. Patient now presents with suspected pneumonia.  Patient is currently intubated on ventilator support MV: 11 L/min Temp (24hrs), Avg:101.1 F (38.4 C), Min:98.1 F (36.7 C), Max:102.4 F (39.1 C)  Propofol: None Continue on VHP @ goal - tolerating Persistent hypotension on multiple pressors per MD note tf will be held with levo @ 10mcg/min Shock A-Line Labs and Medications reviewed: Levo drip, Vasopressin drip, Fentanyl drip, Miralax/Glycolax, Vitamin D CBGs 189-193, Na 133, Ca 7.4, Phos 5.2, K 5.4  Diet Order:  Diet Heart Room service appropriate? Yes; Fluid consistency: Thin  Skin:  Wound (see comment) (Stage 2 R buttocks pressure injury, DTI to R buttocks)  Last BM:  8/20  Height:   Ht Readings from Last 1 Encounters:  10/12/15 5\' 5"  (1.651 m)     Weight:   Wt Readings from Last 1 Encounters:  10/02/15 205 lb 0.4 oz (93 kg)    Ideal Body Weight:  56.8 kg  BMI:  Body mass index is 34.12 kg/m.  Estimated Nutritional Needs:   Kcal:  [id number]  Protein:  110-120g  Fluid:  2L/day  EDUCATION NEEDS:   No education needs identified at this time  [name]. Rodd Heft, MS, RD LDN Inpatient Clinical Dietitian Pager [phone]

## 2015-10-13 NOTE — Progress Notes (Signed)
LCSWA spoke son Research officer, political party on phone. He reports he is Healthcare POA and he has been visiting mother in hospital making decisions for her. LCSWA informed Son that he will need to bring in [place] paperwork. Patient son agreed to bring in paperwork tomorrow when he visits.

## 2015-10-13 NOTE — Progress Notes (Addendum)
PULMONARY / CRITICAL CARE MEDICINE   Name: [name] MRN: [id number] DOB: 11-Dec-1935    ADMISSION DATE:  09/18/2015 CONSULTATION DATE:  10/11/15  REFERRING MD:  Dr. Marthenia Rolling  REASON FOR CONSULT:  Respiratory distress  HISTORY OF PRESENT ILLNESS:   [name] is a 80 y.o. female with distant breast cancer history, non-Hodgkin's lymphoma with suspected recurrence based on FDG avid uptake of hilar and mediastinal lymph nodes on PET scan 09/08/15, CAD and HFpEF (echo 08/25/15 suggesting mild PH), who is chronically debilitated. History is obtained from chart review as the patient is too somnolent to provide it herself. She presented to the [facility] ED on 10/09/2015 complaining of sore throat and joint pain in multiple koints including right hand and wrist. She did not have any infectious symptoms recorded like fevers, cough or GI upset. She did have increased bilateral leg swelling. She was leukocytosis, slightly hypotensive (responded to IV fluids), had worsening parenchymal opacities on CXR and redness over the right hand for which she was started on Cefepime and Vancomycin with blood cultures ultimately returning negative. Of note, she was on 2L/min oxygen by nasal cannula for an unknown period of time before this hospitalization and throughout the next few days she had 2-5L/min requirement and intermittently would use CPAP or BiPAP. She had negative DVT studies in the lower and upper extremities.   PCCM was consulted on 8/19 for respiratory distress and believed the picture fit pulmonary edema vs HAP. She also needed norepinephrine at the time. Antibiotics continued and she was placed on furosemide intermittently, doses held when her creatinine would rise. She was also given supplemental albumin with improvement in blood pressures and cessation of vasopressors.   ON 8/26, she was noted to be tachypneic prompting PCCM reconsultation. Failed BiPAP, intubated 8/27 with progressive shock  afterwards    Subjective: Critically ill, intubated  levo titrated overnight @ 65 mcg! Poor UO    VITAL SIGNS: BP (!) 111/31   Pulse (!) 114   Temp (!) 102.4 F (39.1 C)   Resp (!) 26   Ht 5\' 5"  (1.651 m)   Wt 205 lb 0.4 oz (93 kg)   SpO2 98%   BMI 34.12 kg/m    INTAKE / OUTPUT: I/O last 3 completed shifts: In: 3055.8 [I.V.:700.8; Blood:365; NG/GT:740; IV Piggyback:1250] Out: 620 [Urine:620]  PHYSICAL EXAMINATION: General:  Chr ill, intubated Neuro:  Follows simple commands, RASS 0 HEENT:  Dry oral mucosa, anicteric eyes Cardiovascular:  s1 s2 RRT LUNG: ronchi diffuse Abdomen:  Distended, nontender, no rebound or gaurding Musculoskeletal:  All extremities have 2 pitting edema  Skin:  No major rashes seen  LABS:  BMET  Recent Labs Lab 10/12/15 0500 10/12/15 1607 10/13/15 0500  NA 134* 134* 133*  131*  K 5.1 5.2* 5.4*  5.1  CL 108 110 108  108  CO2 16* 15* 14*  15*  BUN 66* 66* 70*  71*  CREATININE 2.44* 2.76* 2.95*  2.92*  GLUCOSE 90 74 226*  229*    Electrolytes  Recent Labs Lab 10/12/15 0500 10/12/15 1600 10/12/15 1607 10/13/15 0500  CALCIUM 7.8*  --  7.6* 7.4*  7.4*  MG 1.8 1.7  --  1.7  PHOS 5.0*  5.2* 5.7*  --  5.2*  5.1*    CBC  Recent Labs Lab 10/12/15 0400 10/12/15 1600 10/13/15 0500  WBC 29.0* 24.2* 36.6*  HGB 6.9* 8.2* 9.2*  HCT 21.4* 25.3* 28.2*  PLT 200 174 190  Coag's No results for input(s): APTT, INR in the last 168 hours.  Sepsis Markers  Recent Labs Lab 10/11/15 1102 10/11/15 1725 10/11/15 2025 10/13/15 0500  LATICACIDVEN  --  0.9 1.0  --   PROCALCITON 2.39  --   --  3.79    ABG  Recent Labs Lab 10/11/15 2012 10/12/15 0140 10/12/15 1103  PHART 7.289* 7.310* 7.257*  PCO2ART 35.5 29.2* 32.3*  PO2ART 56.2* 73.1* 323*    Liver Enzymes  Recent Labs Lab 10/10/15 1035 10/11/15 1800 10/12/15 0500 10/13/15 0500  AST 158* 147*  --  159*  ALT 138* 102*  --  65*  ALKPHOS 226* 188*   --  232*  BILITOT 0.4 0.6  --  0.6  ALBUMIN 1.8* 2.4* 2.9* 2.4*  2.4*    Cardiac Enzymes No results for input(s): TROPONINI, PROBNP in the last 168 hours.  Glucose  Recent Labs Lab 10/12/15 1617 10/12/15 2040 10/12/15 2357 10/13/15 0441 10/13/15 0832  GLUCAP 71 72 104* 193* 189*    Imaging Dg Chest Port 1 View  Result Date: 10/12/2015 CLINICAL DATA:  Respiratory distress. Evaluate central line placement. EXAM: PORTABLE CHEST 1 VIEW COMPARISON:  Earlier the same day. FINDINGS: Patient is markedly rotated to the left on this study obtained at 2029 hours. Endotracheal tube projects over the mediastinal. The NG tube passes into the stomach although the distal tip position is not included on the film. Right presumably IJ central line is superimposed on the spine. The tip of the catheter is below the level of the carina and is overlying a position that could be compatible with the mid SVC. Vascular congestion with pulmonary edema pattern again noted. The cardio pericardial silhouette is enlarged. Telemetry leads overlie the chest. IMPRESSION: 1. Markedly rotated film without evidence for pneumothorax. 2. Presumed right IJ central line overlies the spine with the tip below the carina overlying a location compatible with mid SVC. 3. Stable cardiomegaly with pulmonary edema pattern. Electronically Signed   By: Misty Stanley M.D.   On: 10/12/2015 21:18   Dg Chest Port 1 View  Result Date: 10/12/2015 CLINICAL DATA:  Evaluate ETT placement EXAM: PORTABLE CHEST 1 VIEW COMPARISON:  10/11/15 FINDINGS: ET tube tip is situated above the carina. There is a left arm PICC line with tip overlying the mediastinum. Stable cardiac enlargement. Persistent pulmonary edema. Small bilateral pleural effusions are unchanged. IMPRESSION: 1. No change in pulmonary edema pattern compared with previous exam. Electronically Signed   By: Kerby Moors M.D.   On: 10/12/2015 09:20   Dg Abd Portable 1v  Result Date:  10/12/2015 CLINICAL DATA:  NG tube placement EXAM: PORTABLE ABDOMEN - 1 VIEW COMPARISON:  None. FINDINGS: NG tube tip is in the proximal to mid stomach. Diffuse airspace disease noted throughout the lungs. Cardiomegaly. Probable small left effusion. IMPRESSION: NG tube tip in the proximal to mid stomach. Electronically Signed   By: Rolm Baptise M.D.   On: 10/12/2015 10:41     STUDIES:  CT Chest 8/17 >>Diffuse septal thickening and ground-glass opacity of the lung probably represents chronic interstitial pulmonary edema ? component of fibrosis  CULTURES: Blood and sputum cultures from yesterday pending 8/16 blood cultures negative x2 8/27 BAL cx >> neg  ANTIBIOTICS: Vancomycin >> 8/27 Imipenem 8/26 >> Zyvox 8/27 >>  LINES/TUBES: Left PICC line 8/25 >> 8/27 RIJ 8/27 >> ETT 8/27 >>   DISCUSSION: [name] is a 80 y.o. female with distant breast cancer history, non-Hodgkin's lymphoma with suspected  recurrence based on FDG avid uptake of hilar and mediastinal lymph nodes on PET scan 09/08/15, CAD and HFpEF (echo 08/25/15 suggesting mild PH), who is chronically debilitated who presented with arthralgias, sore throat, hypotension and possible right hand cellulitis. She was treated for septic (maybe mixed with HFpEF cardiogenic shock) with IV fluids and antibiotics initially with improvement but has had a plateau-ing of her recovery. She is newly febrile once more since 10/10/15 with leucocytosis & progressive shock ? source  ASSESSMENT / PLAN:  PULMONARY A: Acute on chronic hypoxic respiratory failure - etiologies include acute on chronic pulmonary interstitial edema, possible HAP  R/o reoccurrence lymphoma,lymphangitic spread vs infectious P:   CT PRVC 8 cc/kg - no wean until shock resolves Bronch non diagnostic   CARDIOVASCULAR A:  HFpEF Worsening septic shock with new fevers, r/o other causes of shock  P:  Even to neg balance goals, pending tolerability to diuretics post  intubation Tele Check trop, may need echo to r/o pericardial effusion  RENAL A:   Worsening acute kidney injury - likely multifactorial from multiple antibiotics, HFpEF and diuresis.  NAG metabolic acidosis (lactate 0.9) most likely renal failure related P:   May need bicarb gtt if worse  GASTROINTESTINAL A:   GERD P:   NPO Start TFs in 24h once levo requirements down ppi  HEMATOLOGIC A:   Hodgkin's lymphoma, maybe recurrent, hilar and mediastinal lymphadenopathy on PET in July-Follows with Dr. Martha Clan, plan was for repeat PET in october P:  Await BAL cytology, cx neg so far Ct sub q hep  INFECTIOUS A:   Presumptive HAP treated New fevers, leukocytosis concerning for new infection ? source P:   Maintain current abx ID following WBC tagged scan planned  ENDOCRINE A:    hyperglycemia   P:   CBG q 4. Sliding scale.  Expect to rise with bicarb gtt, add lantus 10  NEUROLOGIC A:   Delirium - ddx includes uremia, sepsis, medications Anxiety and depression P:   Delirium precautions  fent drip . Goal RASS -1   FAMILY  - Inter-disciplinary family meet or Palliative Care meeting due by:  Son estranged, will involve palliative care  My Ccm time 35 min   Kara Mead MD. FCCP. [facility] Pager [phone] If no response call [phone]   10/13/2015

## 2015-10-13 NOTE — Progress Notes (Signed)
CRITICAL VALUE ALERT  Critical value received:  Troponin 0.08  Date of notification:  10/13/15  Time of notification:  [id number]  Critical value read back:Yes.    Nurse who received alert:  Katherine Mantle RN  MD notified (1st page):  E link  Time of first page:  62  MD notified (2nd page):  Time of second page:  Responding MD:  E link - [place]  Time MD responded:  1750

## 2015-10-13 NOTE — Progress Notes (Signed)
Inpatient Diabetes Program Recommendations  AACE/ADA: New Consensus Statement on Inpatient Glycemic Control (2015)  Target Ranges:  Prepandial:   less than 140 mg/dL      Peak postprandial:   less than 180 mg/dL (1-2 hours)      Critically ill patients:  140 - 180 mg/dL   Lab Results  Component Value Date   GLUCAP 189 (H) 10/13/2015   HGBA1C (H) 05/14/2010    6.0 (NOTE)                                                                       According to the ADA Clinical Practice Recommendations for 2011, when HbA1c is used as a screening test:   >=6.5%   Diagnostic of Diabetes Mellitus           (if abnormal result  is confirmed)  5.7-6.4%   Increased risk of developing Diabetes Mellitus  References:Diagnosis and Classification of Diabetes Mellitus,Diabetes [id number] 1):S62-S69 and Standards of Medical Care in         Diabetes - 2011,Diabetes Care,2011,34  (Suppl 1):S11-S61.    Review of Glycemic Control  Diabetes history: Unknown Outpatient Diabetes medications: None Current orders for Inpatient glycemic control: Lantus 10 units QD, Novolog sensitive Q4H Last HgbA1C in 2012 - 6.0%.    Inpatient Diabetes Program Recommendations:    Consider ICU Glycemic Control Protocol. Check HgbA1C to assess glycemic control prior to admission.  Will follow.  Thank you. Lorenda Peck, RD, LDN, CDE Inpatient Diabetes Coordinator [phone]

## 2015-10-14 ENCOUNTER — Inpatient Hospital Stay (HOSPITAL_COMMUNITY): Payer: PPO

## 2015-10-14 ENCOUNTER — Encounter (HOSPITAL_COMMUNITY): Payer: Self-pay

## 2015-10-14 LAB — BLOOD GAS, ARTERIAL
ACID-BASE DEFICIT: 7.8 mmol/L — AB (ref 0.0–2.0)
Bicarbonate: 18.3 mmol/L — ABNORMAL LOW (ref 20.0–28.0)
DRAWN BY: 11249
FIO2: 40
MECHVT: 400 mL
O2 SAT: 90 %
PATIENT TEMPERATURE: 37.9
PCO2 ART: 43.8 mmHg (ref 32.0–48.0)
PEEP/CPAP: 5 cmH2O
PH ART: 7.25 — AB (ref 7.350–7.450)
PO2 ART: 70.4 mmHg — AB (ref 83.0–108.0)
RATE: 30 resp/min
TCO2: 17.6 mmol/L (ref 0–100)

## 2015-10-14 LAB — BLOOD CULTURE ID PANEL (REFLEXED)
ACINETOBACTER BAUMANNII: NOT DETECTED
CANDIDA ALBICANS: NOT DETECTED
CANDIDA PARAPSILOSIS: NOT DETECTED
Candida glabrata: NOT DETECTED
Candida krusei: NOT DETECTED
Candida tropicalis: NOT DETECTED
ENTEROBACTERIACEAE SPECIES: NOT DETECTED
ENTEROCOCCUS SPECIES: NOT DETECTED
ESCHERICHIA COLI: NOT DETECTED
Enterobacter cloacae complex: NOT DETECTED
HAEMOPHILUS INFLUENZAE: NOT DETECTED
KLEBSIELLA OXYTOCA: NOT DETECTED
Klebsiella pneumoniae: NOT DETECTED
LISTERIA MONOCYTOGENES: NOT DETECTED
METHICILLIN RESISTANCE: DETECTED — AB
Neisseria meningitidis: NOT DETECTED
PSEUDOMONAS AERUGINOSA: NOT DETECTED
Proteus species: NOT DETECTED
STREPTOCOCCUS AGALACTIAE: NOT DETECTED
STREPTOCOCCUS PNEUMONIAE: NOT DETECTED
STREPTOCOCCUS PYOGENES: NOT DETECTED
STREPTOCOCCUS SPECIES: NOT DETECTED
Serratia marcescens: NOT DETECTED
Staphylococcus aureus (BCID): NOT DETECTED
Staphylococcus species: DETECTED — AB

## 2015-10-14 LAB — GLUCOSE, CAPILLARY
GLUCOSE-CAPILLARY: 227 mg/dL — AB (ref 65–99)
GLUCOSE-CAPILLARY: 223 mg/dL — AB (ref 65–99)
Glucose-Capillary: 181 mg/dL — ABNORMAL HIGH (ref 65–99)
Glucose-Capillary: 190 mg/dL — ABNORMAL HIGH (ref 65–99)
Glucose-Capillary: 204 mg/dL — ABNORMAL HIGH (ref 65–99)
Glucose-Capillary: 215 mg/dL — ABNORMAL HIGH (ref 65–99)

## 2015-10-14 LAB — CBC WITH DIFFERENTIAL/PLATELET
Basophils Absolute: 0 10*3/uL (ref 0.0–0.1)
Basophils Relative: 0 %
Eosinophils Absolute: 2.6 10*3/uL — ABNORMAL HIGH (ref 0.0–0.7)
Eosinophils Relative: 7 %
HCT: 28.9 % — ABNORMAL LOW (ref 36.0–46.0)
Hemoglobin: 9.5 g/dL — ABNORMAL LOW (ref 12.0–15.0)
Lymphocytes Relative: 4 %
Lymphs Abs: 1.5 10*3/uL (ref 0.7–4.0)
MCH: 26.6 pg (ref 26.0–34.0)
MCHC: 32.9 g/dL (ref 30.0–36.0)
MCV: 81 fL (ref 78.0–100.0)
Monocytes Absolute: 1.5 10*3/uL — ABNORMAL HIGH (ref 0.1–1.0)
Monocytes Relative: 4 %
Neutro Abs: 31.1 10*3/uL — ABNORMAL HIGH (ref 1.7–7.7)
Neutrophils Relative %: 85 %
Platelets: 153 10*3/uL (ref 150–400)
RBC: 3.57 MIL/uL — ABNORMAL LOW (ref 3.87–5.11)
RDW: 18.8 % — ABNORMAL HIGH (ref 11.5–15.5)
WBC: 36.7 10*3/uL — ABNORMAL HIGH (ref 4.0–10.5)

## 2015-10-14 LAB — RENAL FUNCTION PANEL
Albumin: 1.8 g/dL — ABNORMAL LOW (ref 3.5–5.0)
Anion gap: 9 (ref 5–15)
BUN: 73 mg/dL — ABNORMAL HIGH (ref 6–20)
CO2: 19 mmol/L — ABNORMAL LOW (ref 22–32)
Calcium: 7 mg/dL — ABNORMAL LOW (ref 8.9–10.3)
Chloride: 102 mmol/L (ref 101–111)
Creatinine, Ser: 3.19 mg/dL — ABNORMAL HIGH (ref 0.44–1.00)
GFR calc Af Amer: 15 mL/min — ABNORMAL LOW (ref >60)
GFR calc non Af Amer: 13 mL/min — ABNORMAL LOW (ref >60)
Glucose, Bld: 257 mg/dL — ABNORMAL HIGH (ref 65–99)
Phosphorus: 4.8 mg/dL — ABNORMAL HIGH (ref 2.5–4.6)
Potassium: 4.5 mmol/L (ref 3.5–5.1)
Sodium: 130 mmol/L — ABNORMAL LOW (ref 135–145)

## 2015-10-14 LAB — ACID FAST SMEAR (AFB): ACID FAST SMEAR - AFSCU2: NEGATIVE

## 2015-10-14 LAB — CULTURE, BAL-QUANTITATIVE: CULTURE: NORMAL — AB

## 2015-10-14 LAB — HSV CULTURE AND TYPING

## 2015-10-14 LAB — ACID FAST SMEAR (AFB, MYCOBACTERIA)

## 2015-10-14 LAB — CULTURE, BAL-QUANTITATIVE W GRAM STAIN

## 2015-10-14 MED ORDER — ASPIRIN 81 MG PO CHEW
81.0000 mg | CHEWABLE_TABLET | Freq: Every day | ORAL | Status: DC
  Administered 2015-10-14 – 2015-10-15 (×2): 81 mg
  Filled 2015-10-14 (×2): qty 1

## 2015-10-14 MED ORDER — BISACODYL 10 MG RE SUPP: 10.0000 mg | Freq: Every day | RECTAL | Status: DC | PRN

## 2015-10-14 MED ORDER — SORBITOL 70 % SOLN
30.0000 mL | Freq: Once | Status: AC
  Administered 2015-10-14: 30 mL via ORAL
  Filled 2015-10-14: qty 30

## 2015-10-14 MED ORDER — SENNOSIDES 8.8 MG/5ML PO SYRP
10.0000 mL | ORAL_SOLUTION | Freq: Every day | ORAL | Status: DC
  Administered 2015-10-14: 10 mL via ORAL
  Filled 2015-10-14: qty 10

## 2015-10-14 NOTE — Progress Notes (Signed)
PULMONARY / CRITICAL CARE MEDICINE   Name: [name] MRN: [id number] DOB: [birth year]    ADMISSION DATE:  10/16/2015 CONSULTATION DATE:  10/11/15  REFERRING MD:  Dr. Marthenia Rolling  REASON FOR CONSULT:  Respiratory distress  HISTORY OF PRESENT ILLNESS:   [name] is a 80 y.o. female with distant breast cancer history, non-Hodgkin's lymphoma with suspected recurrence based on FDG avid uptake of hilar and mediastinal lymph nodes on PET scan 09/08/15, CAD and HFpEF (echo 08/25/15 suggesting mild PH), who is chronically debilitated. History is obtained from chart review as the patient is too somnolent to provide it herself. She presented to the [facility] ED on 10/11/2015 complaining of sore throat and joint pain in multiple koints including right hand and wrist. She did not have any infectious symptoms recorded like fevers, cough or GI upset. She did have increased bilateral leg swelling. She was leukocytosis, slightly hypotensive (responded to IV fluids), had worsening parenchymal opacities on CXR and redness over the right hand for which she was started on Cefepime and Vancomycin with blood cultures ultimately returning negative. Of note, she was on 2L/min oxygen by nasal cannula for an unknown period of time before this hospitalization and throughout the next few days she had 2-5L/min requirement and intermittently would use CPAP or BiPAP. She had negative DVT studies in the lower and upper extremities.  PCCM was consulted on 8/19 for respiratory distress and believed the picture fit pulmonary edema vs HAP. She also needed norepinephrine at the time. Antibiotics continued and she was placed on furosemide intermittently, doses held when her creatinine would rise. She was also given supplemental albumin with improvement in blood pressures and cessation of vasopressors.   ON 8/26, she was noted to be tachypneic prompting PCCM reconsultation. Failed BiPAP, intubated 8/27 with progressive shock  afterwards   Subjective: Critically ill, intubated still on high pressors. Creatinine is worse    VITAL SIGNS: BP (!) 108/47   Pulse (!) 107   Temp 100.2 F (37.9 C)   Resp (!) 30   Ht 5\' 5"  (1.651 m)   Wt 255 lb 15.3 oz (116.1 kg)   SpO2 96%   BMI 42.59 kg/m    INTAKE / OUTPUT: I/O last 3 completed shifts: In: 6791.5 [I.V.:3564.2; Other:165; NG/GT:1902.3; IV Piggyback:1160] Out: 445 [Urine:445]  PHYSICAL EXAMINATION: General:  Chr ill, intubated, remains now critically ill and unresponsive  Neuro:  No longer following commands. Eyes are open, RASS 0 HEENT:  Dry oral mucosa, anicteric eyes Cardiovascular:  s1 s2 RRT LUNG: diffuse rhonchi equal chest rise Abdomen:  Distended, nontender, no rebound or gaurding Musculoskeletal:  All extremities have 2 pitting edema  Skin:  No major rashes seen  LABS:  BMET  Recent Labs Lab 10/12/15 1607 10/13/15 0500 10/14/15 0500  NA 134* 133*  131* 130*  K 5.2* 5.4*  5.1 4.5  CL 110 108  108 102  CO2 15* 14*  15* 19*  BUN 66* 70*  71* 73*  CREATININE 2.76* 2.95*  2.92* 3.19*  GLUCOSE 74 226*  229* 257*    Electrolytes  Recent Labs Lab 10/12/15 1600 10/12/15 1607 10/13/15 0500 10/13/15 1700 10/14/15 0500  CALCIUM  --  7.6* 7.4*  7.4*  --  7.0*  MG 1.7  --  1.7 1.6*  --   PHOS 5.7*  --  5.2*  5.1* 4.7* 4.8*    CBC  Recent Labs Lab 10/12/15 1600 10/13/15 0500 10/14/15 0500  WBC 24.2* 36.6* 36.7*  HGB  8.2* 9.2* 9.5*  HCT 25.3* 28.2* 28.9*  PLT 174 190 153    Coag's No results for input(s): APTT, INR in the last 168 hours.  Sepsis Markers  Recent Labs Lab 10/11/15 1102 10/11/15 1725 10/11/15 2025 10/13/15 0500  LATICACIDVEN  --  0.9 1.0  --   PROCALCITON 2.39  --   --  3.79    ABG  Recent Labs Lab 10/12/15 1103 10/13/15 0905 10/14/15 0500  PHART 7.257* 7.174* 7.250*  PCO2ART 32.3* 39.9 43.8  PO2ART 323* 93.8 70.4*    Liver Enzymes  Recent Labs Lab 10/10/15 1035  10/11/15 1800 10/12/15 0500 10/13/15 0500 10/14/15 0500  AST 158* 147*  --  159*  --   ALT 138* 102*  --  65*  --   ALKPHOS 226* 188*  --  232*  --   BILITOT 0.4 0.6  --  0.6  --   ALBUMIN 1.8* 2.4* 2.9* 2.4*  2.4* 1.8*    Cardiac Enzymes  Recent Labs Lab 10/13/15 1359  TROPONINI 0.08*    Glucose  Recent Labs Lab 10/13/15 1259 10/13/15 1532 10/13/15 1953 10/13/15 2314 10/14/15 0310 10/14/15 0742  GLUCAP 250* 272* 313* 213* 227* 223*    Imaging Dg Chest Port 1 View  Result Date: 10/14/2015 CLINICAL DATA:  Respiratory failure. EXAM: PORTABLE CHEST 1 VIEW COMPARISON:  10/12/2015. FINDINGS: Endotracheal tube, NG tube, right IJ line stable position. Cardiomegaly with diffuse bilateral pulmonary infiltrates and/or edema with bilateral pleural effusions. No pneumothorax. IMPRESSION: 1.  Lines and tubes in stable position. 2. Cardiomegaly with diffuse bilateral pulmonary infiltrates and/or edema and right-sided pleural effusion. Findings consistent with persistent congestive heart failure. No interim change from prior exam. Electronically Signed   By: [place]   On: 10/14/2015 06:57     STUDIES:  CT Chest 8/17 >>Diffuse septal thickening and ground-glass opacity of the lung probably represents chronic interstitial pulmonary edema ? component of fibrosis  CULTURES: Blood and sputum cultures from pending  8/16 blood cultures negative x2 8/27 BAL cx >> neg  ANTIBIOTICS: Vancomycin >> 8/27 Imipenem 8/26 >> Zyvox 8/27 >> eraxiz 8/29>>>  LINES/TUBES: Left PICC line 8/25 >> 8/27 RIJ 8/27 >> ETT 8/27 >>   DISCUSSION: [name] is a 80 y.o. female with distant breast cancer history, non-Hodgkin's lymphoma with suspected recurrence based on FDG avid uptake of hilar and mediastinal lymph nodes on PET scan 09/08/15, CAD and HFpEF (echo 08/25/15 suggesting mild PH), who is chronically debilitated who presented with arthralgias, sore throat, hypotension and possible  right hand cellulitis. She was treated for septic (maybe mixed with HFpEF cardiogenic shock) with IV fluids and antibiotics initially with improvement but has had a plateau-ing of her recovery. She is newly febrile once more since 10/10/15 with leucocytosis & progressive shock/MODS w/out clear source. Do not think she will survive this and in the event she does it will be w/ even more debilitation and co-morbids. Will reach out to family. We need to consider limitations of care and define stopping points.   ASSESSMENT / PLAN:  PULMONARY A: Acute on chronic hypoxic respiratory failure - etiologies include acute on chronic pulmonary interstitial edema, possible HAP  R/o reoccurrence lymphoma,lymphangitic spread vs infectious CXR worse.  Bronch non diagnostic  P:   CT PRVC 8 cc/kg - no wean until shock resolves F/u CXR and abg PAD protocol   CARDIOVASCULAR A:  HFpEF Worsening septic shock/MODS with new fevers, r/o other causes of shock \ -->bedside echo w/out  pericardial effusion 8/28 P:  Cont euvolemia goal Cont pressors for MAP gaol >65 No escalation   RENAL A:   Worsening acute kidney injury - likely multifactorial from multiple antibiotics, HFpEF and diuresis.  NAG metabolic acidosis (lactate 0.9) most likely renal failure related Hyperphosphatemia Hypomagnesemia  P:   Cont bicarb gtt Serial chemistries  Repeat Mg Replace lytes as needed   GASTROINTESTINAL A:   GERD Severe protein calorie malnutrition  Shock liver  P:   NPO Will consider tubefeeds in next 24 hrs; but pressor requirements not better ppi  HEMATOLOGIC A:   Hodgkin's lymphoma, maybe recurrent, hilar and mediastinal lymphadenopathy on PET in July-Follows with Dr. Martha Clan, plan was for repeat PET in october P:  Await BAL cytology Ct sub q hep  INFECTIOUS A:   Presumptive HAP treated New fevers, leukocytosis concerning for new infection ? Source-->also possibly cancer?? P:   Maintain current  abx ID following WBC tagged scan planned  ENDOCRINE A:    hyperglycemia   P:   CBG q 4. Sliding scale & lantus   NEUROLOGIC A:   Delirium/ acute encephalopathy  - ddx includes uremia, sepsis, medications Anxiety and depression P:   Delirium precautions  fent drip . Goal RASS -1   FAMILY  - Inter-disciplinary family meet or Palliative Care meeting due by:  Son estranged, will involve palliative care   Erick Colace ACNP-BC [place] Pager # [phone] OR # [phone] if no answer  10/14/2015

## 2015-10-14 NOTE — Progress Notes (Signed)
PHARMACY - PHYSICIAN COMMUNICATION CRITICAL VALUE ALERT - BLOOD CULTURE IDENTIFICATION (BCID)  Results for orders placed or performed during the hospital encounter of 10/14/2015  Blood Culture ID Panel (Reflexed) (Collected: 10/13/2015 11:55 AM)  Result Value Ref Range   Enterococcus species NOT DETECTED NOT DETECTED   Listeria monocytogenes NOT DETECTED NOT DETECTED   Staphylococcus species DETECTED (A) NOT DETECTED   Staphylococcus aureus NOT DETECTED NOT DETECTED   Methicillin resistance DETECTED (A) NOT DETECTED   Streptococcus species NOT DETECTED NOT DETECTED   Streptococcus agalactiae NOT DETECTED NOT DETECTED   Streptococcus pneumoniae NOT DETECTED NOT DETECTED   Streptococcus pyogenes NOT DETECTED NOT DETECTED   Acinetobacter baumannii NOT DETECTED NOT DETECTED   Enterobacteriaceae species NOT DETECTED NOT DETECTED   Enterobacter cloacae complex NOT DETECTED NOT DETECTED   Escherichia coli NOT DETECTED NOT DETECTED   Klebsiella oxytoca NOT DETECTED NOT DETECTED   Klebsiella pneumoniae NOT DETECTED NOT DETECTED   Proteus species NOT DETECTED NOT DETECTED   Serratia marcescens NOT DETECTED NOT DETECTED   Haemophilus influenzae NOT DETECTED NOT DETECTED   Neisseria meningitidis NOT DETECTED NOT DETECTED   Pseudomonas aeruginosa NOT DETECTED NOT DETECTED   Candida albicans NOT DETECTED NOT DETECTED   Candida glabrata NOT DETECTED NOT DETECTED   Candida krusei NOT DETECTED NOT DETECTED   Candida parapsilosis NOT DETECTED NOT DETECTED   Candida tropicalis NOT DETECTED NOT DETECTED    Name of physician (or Provider) ContactedAshby Dawes Changes to prescribed antibiotics required: none  Biagio Borg 10/14/2015  7:43 PM

## 2015-10-14 NOTE — Progress Notes (Signed)
[facility] for Infectious Disease    Date of Admission:  09/24/2015   Total days of antibiotics 14        Day 4 imi        Day 3 linezolid        Day 2 anidulafungin           ID: [name] is a 80 y.o. female with hx of MM initially admitted with sore throat, myalgias who was treated HCAP with cefepime but clinically worsened on day #10 of abtx. She started to have worsening tachypnea then intubated on 8/27. High fevers since 8/25.  Principal Problem:   Leukocytosis Active Problems:   Coronary atherosclerosis   Anasarca   History of B-cell lymphoma   Abnormal chest x-ray   Cellulitis   Joint pain   Sore throat   Acute hypoxemic respiratory failure (HCC)   HCAP (healthcare-associated pneumonia)   Pressure ulcer   AKI (acute kidney injury) (HCC)   Protein-calorie malnutrition, severe   Fever, unspecified   Poor venous access    Subjective:  Fever curve slightly improved, down to 100.2 this morning, hemodynamically still requiring 2 pressors.  Leukocytosis flat at  36.6. Cr at 3. Underwent tagged wbc scan that focused on pulmonary process. The patient still has ileus.  Medications:  . anidulafungin  100 mg Intravenous Q24H  . aspirin  81 mg Per Tube Daily  . budesonide (PULMICORT) nebulizer solution  0.5 mg Nebulization BID  . chlorhexidine  15 mL Mouth Rinse BID  . cholecalciferol  1,000 Units Oral q morning - 10a  . feeding supplement (PRO-STAT SUGAR FREE 64)  30 mL Per Tube Daily  . heparin subcutaneous  5,000 Units Subcutaneous Q8H  . imipenem-cilastatin  500 mg Intravenous Q12H  . insulin aspart  0-9 Units Subcutaneous Q4H  . insulin glargine  10 Units Subcutaneous Daily  . linezolid (ZYVOX) IV  600 mg Intravenous Q12H  . mouth rinse  15 mL Mouth Rinse q12n4p  . pantoprazole sodium  40 mg Per Tube Q1200  . polyethylene glycol  17 g Oral Daily  . sennosides  10 mL Oral QHS  . sodium chloride  250 mL Intravenous Once  . sorbitol  30 mL Oral Once     Objective: Vital signs in last 24 hours: Temp:  [99.9 F (37.7 C)-101.1 F (38.4 C)] 100.2 F (37.9 C) (08/29 1316) Pulse Rate:  [100-123] 109 (08/29 1316) Resp:  [0-31] 21 (08/29 1316) BP: (100-131)/(30-53) 108/47 (08/29 0847) SpO2:  [91 %-100 %] 95 % (08/29 1316) Arterial Line BP: (77-143)/(42-140) 112/48 (08/29 1245) FiO2 (%):  [40 %] 40 % (08/29 1316) Weight:  [255 lb 15.3 oz (116.1 kg)] 255 lb 15.3 oz (116.1 kg) (08/28 1400) Physical Exam  Constitutional:  Intubate and sedated appears chronically and well-nourished. No distress.  HENT: [place]/AT,  Mouth/Throat: intubated Cardiovascular: tachy, regular rhythm and normal heart sounds. Exam reveals no gallop and no friction rub.  No murmur heard.  Pulmonary/Chest: Effort normal and breath sounds normal. No respiratory distress.  has no wheezes.  Abdominal: Soft, Bowel sounds are absent. Diffusely stended  Ext: anasarca diffuse Skin: Skin is warm and dry. No rash noted. No erythema.    Lab Results  Recent Labs  10/13/15 0500 10/14/15 0500  WBC 36.6* 36.7*  HGB 9.2* 9.5*  HCT 28.2* 28.9*  NA 133*  131* 130*  K 5.4*  5.1 4.5  CL 108  108 102  CO2 14*  15*  19*  BUN 70*  71* 73*  CREATININE 2.95*  2.92* 3.19*   Liver Panel  Recent Labs  10/11/15 1800  10/13/15 0500 10/14/15 0500  PROT 4.8*  --  4.9*  --   ALBUMIN 2.4*  < > 2.4*  2.4* 1.8*  AST 147*  --  159*  --   ALT 102*  --  65*  --   ALKPHOS 188*  --  232*  --   BILITOT 0.6  --  0.6  --   < > = values in this interval not displayed. Microbiology: 8/26 urine cx yeast 8/28 blood cx ngtd 8/26 bal few budding yeast Studies/Results: Dg Chest Port 1 View  Result Date: 10/14/2015 CLINICAL DATA:  Respiratory failure. EXAM: PORTABLE CHEST 1 VIEW COMPARISON:  10/12/2015. FINDINGS: Endotracheal tube, NG tube, right IJ line stable position. Cardiomegaly with diffuse bilateral pulmonary infiltrates and/or edema with bilateral pleural effusions. No  pneumothorax. IMPRESSION: 1.  Lines and tubes in stable position. 2. Cardiomegaly with diffuse bilateral pulmonary infiltrates and/or edema and right-sided pleural effusion. Findings consistent with persistent congestive heart failure. No interim change from prior exam. Electronically Signed   By: [place]   On: 10/14/2015 06:57   Dg Chest Port 1 View  Result Date: 10/12/2015 CLINICAL DATA:  Respiratory distress. Evaluate central line placement. EXAM: PORTABLE CHEST 1 VIEW COMPARISON:  Earlier the same day. FINDINGS: Patient is markedly rotated to the left on this study obtained at 2029 hours. Endotracheal tube projects over the mediastinal. The NG tube passes into the stomach although the distal tip position is not included on the film. Right presumably IJ central line is superimposed on the spine. The tip of the catheter is below the level of the carina and is overlying a position that could be compatible with the mid SVC. Vascular congestion with pulmonary edema pattern again noted. The cardio pericardial silhouette is enlarged. Telemetry leads overlie the chest. IMPRESSION: 1. Markedly rotated film without evidence for pneumothorax. 2. Presumed right IJ central line overlies the spine with the tip below the carina overlying a location compatible with mid SVC. 3. Stable cardiomegaly with pulmonary edema pattern. Electronically Signed   By: Misty Stanley M.D.   On: 10/12/2015 21:18     Assessment/Plan: Sepsis in immunocompromised host = continue on anidulafungin to cover invasive fungal infection. As well as  linezolid and imipenem lab has added on afb cx (looking for nocardia) and fungal culture. Other possibility, could be CMV pneumonitis. Will check cmv ig G, VL.   aki = likely due to hypotension, and hypotension needing vasopressors. defer to pulm critical care  Ileus = constipated for the past week. Receiving miralax.  Remains critical ill, her condition is guarded  [name]  [facility] for Infectious Diseases Cell: [phone] Pager: [phone]  10/14/2015, 1:46 PM

## 2015-10-14 NOTE — Progress Notes (Signed)
Lab called and said unable to add on CMV

## 2015-10-14 NOTE — Progress Notes (Signed)
Nutrition Follow-up  DOCUMENTATION CODES:   Non-severe (moderate) malnutrition in context of acute illness/injury, Obesity unspecified  INTERVENTION:  VHP @ 76m/hr via OGT - PEPuP protocol 372mProstat daily Permitted hypocaloric feedings Regimen provides 1300 calories, 120gm protein, and 1003cc free water  NUTRITION DIAGNOSIS:   Increased nutrient needs related to wound healing as evidenced by estimated needs. -ongoing  GOAL:   Provide needs based on ASPEN/SCCM guidelines -Not meeting  MONITOR:   Vent status, Labs, Weight trends, TF tolerance, Skin, I & O's  ASSESSMENT:   NaAmri Lienrew is a [age].o. woman with a remote history of breast cancer, Non-Hodgkin's lymphoma (followed by Dr. EnMarin Olppatient reports recent PET scan x 2 suggestive of recurrent disease, due to see Dr. EnMarin Olpgain in October), CAD, HTN, HLD, grade I diastolic dysfunction, and chronic debility (she was wheelchair bound even prior to being admitted to SNF after her last discharge earlier this month). During her last admission 8/2-8/5, she had severe sepsis attributed to UTI. Patient now presents with suspected pneumonia.  Patient is currently intubated on ventilator support MV: 12.2 L/min Temp (24hrs), Avg:100.5 F (38.1 C), Min:100 F (37.8 C), Max:101.7 F (38.7 C)  Labs and medications reviewed: Fentanyl drip Levo drip - now at 4063mmin, Vasopressin drip HCO3 in D5 @ 68m25m --> 306 calories CBGs 223; Na 130, Mg 1.6, Phos 4.8, LFTs elevated TFs restarted - tolerating Worsening AKI -  Met acidosis - on Bicarb drip improving. Septic shock Poss recurrent lymphoma.  Diet Order:     Skin:  Wound (see comment) (Stage 2 R buttocks pressure injury, DTI to R buttocks)  Last BM:  8/20  Height:   Ht Readings from Last 1 Encounters:  10/12/15 _0  (1.651 m)    Weight:   Wt Readings from Last 1 Encounters:  10/13/15 255 lb 15.3 oz (116.1 kg)    Ideal Body Weight:  56.8 kg  BMI:  Body  mass index is 42.59 kg/m.  Estimated Nutritional Needs:   Kcal:  [id number]:  110-120g  Fluid:  2L/day  EDUCATION NEEDS:   No education needs identified at this time  WillSatira Anisrd, MS, RD LDN Inpatient Clinical Dietitian Pager [phone]

## 2015-10-14 NOTE — Consult Note (Signed)
   [facility] CM Inpatient Consult   10/14/2015  Markeisha Beaubien Seres 03-22-1935 [id number]   Chart reviewed. [name] now intubated on vent. [facility] Care Management not appropriate at this time. Will follow along and engage if/when appropriate.  Marthenia Rolling, MSN-Ed, RN,BSN [facility] Liaison [phone]

## 2015-10-14 NOTE — Progress Notes (Signed)
Patient was transported to nuclear medicine for a scan and back to room 1229 without any apparent complications. RT will continue to monitor.

## 2015-10-15 ENCOUNTER — Inpatient Hospital Stay (HOSPITAL_COMMUNITY): Payer: PPO

## 2015-10-15 LAB — BASIC METABOLIC PANEL
Anion gap: 10 (ref 5–15)
BUN: 80 mg/dL — AB (ref 6–20)
CALCIUM: 7.4 mg/dL — AB (ref 8.9–10.3)
CO2: 22 mmol/L (ref 22–32)
Chloride: 100 mmol/L — ABNORMAL LOW (ref 101–111)
Creatinine, Ser: 3.5 mg/dL — ABNORMAL HIGH (ref 0.44–1.00)
GFR calc Af Amer: 13 mL/min — ABNORMAL LOW (ref >60)
GFR, EST NON AFRICAN AMERICAN: 12 mL/min — AB (ref >60)
GLUCOSE: 186 mg/dL — AB (ref 65–99)
POTASSIUM: 4.5 mmol/L (ref 3.5–5.1)
SODIUM: 132 mmol/L — AB (ref 135–145)

## 2015-10-15 LAB — CULTURE, BLOOD (ROUTINE X 2)

## 2015-10-15 LAB — BLOOD GAS, ARTERIAL
ACID-BASE DEFICIT: 5.2 mmol/L — AB (ref 0.0–2.0)
Bicarbonate: 20.6 mmol/L (ref 20.0–28.0)
DRAWN BY: 11249
FIO2: 40
O2 Saturation: 82.6 %
PEEP: 5 cmH2O
PH ART: 7.273 — AB (ref 7.350–7.450)
Patient temperature: 38.7
RATE: 30 resp/min
TCO2: 19.2 mmol/L (ref 0–100)
VT: 400 mL
pCO2 arterial: 47.2 mmHg (ref 32.0–48.0)
pO2, Arterial: 59.3 mmHg — ABNORMAL LOW (ref 83.0–108.0)

## 2015-10-15 LAB — CBC WITH DIFFERENTIAL/PLATELET
Basophils Absolute: 0 10*3/uL (ref 0.0–0.1)
Basophils Relative: 0 %
Eosinophils Absolute: 3.7 10*3/uL — ABNORMAL HIGH (ref 0.0–0.7)
Eosinophils Relative: 10 %
HCT: 28.7 % — ABNORMAL LOW (ref 36.0–46.0)
Hemoglobin: 9.5 g/dL — ABNORMAL LOW (ref 12.0–15.0)
Lymphocytes Relative: 6 %
Lymphs Abs: 2.2 10*3/uL (ref 0.7–4.0)
MCH: 26.1 pg (ref 26.0–34.0)
MCHC: 33.1 g/dL (ref 30.0–36.0)
MCV: 78.8 fL (ref 78.0–100.0)
Monocytes Absolute: 2.2 10*3/uL — ABNORMAL HIGH (ref 0.1–1.0)
Monocytes Relative: 6 %
Neutro Abs: 28.6 10*3/uL — ABNORMAL HIGH (ref 1.7–7.7)
Neutrophils Relative %: 78 %
Platelets: 144 10*3/uL — ABNORMAL LOW (ref 150–400)
RBC: 3.64 MIL/uL — ABNORMAL LOW (ref 3.87–5.11)
RDW: 19 % — ABNORMAL HIGH (ref 11.5–15.5)
WBC: 36.7 10*3/uL — ABNORMAL HIGH (ref 4.0–10.5)

## 2015-10-15 LAB — RENAL FUNCTION PANEL
Albumin: 1.8 g/dL — ABNORMAL LOW (ref 3.5–5.0)
Anion gap: 10 (ref 5–15)
BUN: 82 mg/dL — ABNORMAL HIGH (ref 6–20)
CO2: 23 mmol/L (ref 22–32)
Calcium: 7.1 mg/dL — ABNORMAL LOW (ref 8.9–10.3)
Chloride: 99 mmol/L — ABNORMAL LOW (ref 101–111)
Creatinine, Ser: 3.44 mg/dL — ABNORMAL HIGH (ref 0.44–1.00)
GFR calc Af Amer: 14 mL/min — ABNORMAL LOW (ref >60)
GFR calc non Af Amer: 12 mL/min — ABNORMAL LOW (ref >60)
Glucose, Bld: 189 mg/dL — ABNORMAL HIGH (ref 65–99)
Phosphorus: 4.9 mg/dL — ABNORMAL HIGH (ref 2.5–4.6)
Potassium: 4.5 mmol/L (ref 3.5–5.1)
Sodium: 132 mmol/L — ABNORMAL LOW (ref 135–145)

## 2015-10-15 LAB — GLUCOSE, CAPILLARY
GLUCOSE-CAPILLARY: 172 mg/dL — AB (ref 65–99)
GLUCOSE-CAPILLARY: 182 mg/dL — AB (ref 65–99)
GLUCOSE-CAPILLARY: 174 mg/dL — AB (ref 65–99)

## 2015-10-15 LAB — CULTURE, RESPIRATORY

## 2015-10-15 LAB — CULTURE, RESPIRATORY W GRAM STAIN

## 2015-10-15 LAB — PROCALCITONIN: Procalcitonin: 4.13 ng/mL

## 2015-10-15 LAB — CMV ANTIBODY, IGG (EIA)

## 2015-10-15 MED ORDER — LORAZEPAM BOLUS VIA INFUSION
2.0000 mg | INTRAVENOUS | Status: DC | PRN
  Filled 2015-10-15: qty 5

## 2015-10-15 MED ORDER — LORAZEPAM 2 MG/ML IJ SOLN
5.0000 mg/h | INTRAMUSCULAR | Status: DC
  Filled 2015-10-15: qty 25

## 2015-10-15 MED ORDER — BISACODYL 10 MG RE SUPP
10.0000 mg | Freq: Once | RECTAL | Status: AC
Start: 2015-10-15 — End: 2015-10-15
  Administered 2015-10-15: 10 mg via RECTAL
  Filled 2015-10-15: qty 1

## 2015-10-15 MED ORDER — SODIUM CHLORIDE 0.9 % IV SOLN
100.0000 ug/h | INTRAVENOUS | Status: DC
  Filled 2015-10-15: qty 50

## 2015-10-15 MED ORDER — ALBUMIN HUMAN 25 % IV SOLN
12.5000 g | Freq: Once | INTRAVENOUS | Status: AC
  Administered 2015-10-15: 12.5 g via INTRAVENOUS
  Filled 2015-10-15: qty 50

## 2015-10-15 MED ORDER — FENTANYL BOLUS VIA INFUSION
50.0000 ug | INTRAVENOUS | Status: DC | PRN
  Filled 2015-10-15: qty 200

## 2015-10-16 LAB — CMV DNA, QUANTITATIVE, PCR
CMV DNA Quant: NEGATIVE IU/mL
Log10 CMV Qn DNA Pl: UNDETERMINED log10 IU/mL

## 2015-10-17 NOTE — Progress Notes (Signed)
Pharmacy Antibiotic Note  [name] is a 80 y.o. female admitted on 09/23/2015 with suspected HCAP.  Patient has been clinically worsening, intubated 8/27, PCT increasing.  ID following and recommended escalating antibiotic coverage from Cefepime to Primaxin, changed Vancomycin to Linezolid, and added Eraxis.  Today, 10/30/2015: Day #15 total antibiotics:  D4 Primaxin, D3 Linezolid, D3 Eraxis. Remains febrile, WBC elevated, SCr increasing, CrCl ~ 18, PCT increasing  Plan:  Continue Primaxin 500 mg IV q12h  Continue Linezolid 600mg  IV q12h per MD  Continue anidulafungin 100mg  q24h per MD Follow up renal fxn, culture results, clinical course, and ID recommendations.   Height: 5\' 5"  (165.1 cm) Weight: 275 lb 5.7 oz (124.9 kg) IBW/kg (Calculated) : 57  Last weight documented as 93 kg (09/18/15)  Temp (24hrs), Avg:100.4 F (38 C), Min:99.9 F (37.7 C), Max:102 F (38.9 C)   Recent Labs Lab 10/11/15 1725  10/11/15 2025 10/12/15 0400 10/12/15 0500 10/12/15 1600 10/12/15 1607 10/13/15 0500 10/14/15 0500 10-30-2015 0415  WBC  --   --   --  29.0*  --  24.2*  --  36.6* 36.7* 36.7*  CREATININE  --   < >  --  2.46* 2.44*  --  2.76* 2.95*  2.92* 3.19* 3.44*  3.50*  LATICACIDVEN 0.9  --  1.0  --   --   --   --   --   --   --   < > = values in this interval not displayed.  Estimated Creatinine Clearance: 17.9 mL/min (by C-G formula based on SCr of 3.44 mg/dL).    No Known Allergies  Antimicrobials this admission: 8/16 Cefepime >> 8/26 8/16 Vancomycin >> 8/21; resume 8/25 >> 8/27 8/26 Primaxin >> 8/27 Zyvox >> 8/28 Eraxis >>  Dose adjustments this admission: 8/18 VT= 36 on 1250mg  q12hr prior doses charted correctly. 8/19 VR @1000  = 27 8/21 adjusted Cefepime 1g q12h to 1g q8h for improved SCr with WBC/PCT remaining elevated 8/26 Reduce cefepime to 1g q12h for increased SCr 8/27 Adjust Primaxin to 500 mg q12h for CrCl~21   Microbiology results: 8/16 BCx: ng-final 8/16  Rapid strep screen: neg 8/16 Group A strep cxt: neg 8/19 MRSA PCR: neg 8/25 BCx: NGTD 8/26 Ucx: >100k Yeast (F) 8/27  PCP smear: negative (results faxed from [facility] 8/27 BAL cx: 40k colonies, normal flora.  AFB smear neg, Fungus pending 8/28 BCx: 1/1 CoNS         8/28 BCID: Staphylococcus species, Methicillin resistance DETECTED   Thank you for allowing pharmacy to be a part of this patient's care.  Gretta Arab PharmD, BCPS Pager [phone] 10-30-15 11:27 AM

## 2015-10-17 NOTE — Progress Notes (Signed)
eLink Physician-Brief Progress Note Patient Name: [name] DOB: 1935/10/31 MRN: [id number]   Date of Service  2015-10-28  HPI/Events of Note  Oliguria - Creatinine =  3.44, Albumin = 1.8and CVP = 12.   eICU Interventions  Will order: 1. 25% Albumin 12.5 gm IV now.      Intervention Category Intermediate Interventions: Oliguria - evaluation and management  Eben Choinski [place] 10/28/2015, 5:56 AM

## 2015-10-17 NOTE — Procedures (Signed)
Extubation Procedure Note  Patient Details:   Name: [name] DOB: 11-06-1935 MRN: [id number]   Airway Documentation:  Airway 7.5 mm (Active)  Secured at (cm) 21 cm 11/01/2015 12:10 PM  Measured From Lips Nov 01, 2015 12:10 PM  [place] 01-Nov-2015 12:10 PM  Secured By [employer] 01-Nov-2015 12:10 PM  Tube Holder Repositioned Yes Nov 01, 2015 12:10 PM  Cuff Pressure (cm H2O) 25 cm H2O 11/01/2015 12:10 PM  Site Condition Dry 10/14/2015  4:45 PM    Evaluation  O2 sats: 94 Complications: none Patient tolerated procedure well. Bilateral Breath Sounds: Diminished, Rhonchi   Per CCM order pt extubated and placed on 2L nasal cannula.   Martha Clan 11/01/2015, 4:22 PM

## 2015-10-17 NOTE — Progress Notes (Signed)
Pt's heart rate showed as asystole on monitors. Pt showed no respiratory effort. This RN and Wandra Scot, RN listened to pt's chest for 60 seconds. No breath sounds or heart sounds were auscultated. Pt's son was at bedside and notified. CCMD, e-Link MD, and [facility] were all notified.   Bobbye Riggs, RN

## 2015-10-17 NOTE — Progress Notes (Signed)
PULMONARY / CRITICAL CARE MEDICINE   Name: [name] MRN: [id number] DOB: 1935-02-28    ADMISSION DATE:  09/30/2015 CONSULTATION DATE:  10/11/15  REFERRING MD:  Dr. Marthenia Rolling  REASON FOR CONSULT:  Respiratory distress  HISTORY OF PRESENT ILLNESS:   [name] is a 80 y.o. female with distant breast cancer history, non-Hodgkin's lymphoma with suspected recurrence based on FDG avid uptake of hilar and mediastinal lymph nodes on PET scan 09/08/15, CAD and HFpEF (echo 08/25/15 suggesting mild PH), who is chronically debilitated. History is obtained from chart review as the patient is too somnolent to provide it herself. She presented to the [facility] ED on 10/10/2015 complaining of sore throat and joint pain in multiple koints including right hand and wrist. She did not have any infectious symptoms recorded like fevers, cough or GI upset. She did have increased bilateral leg swelling. She was leukocytosis, slightly hypotensive (responded to IV fluids), had worsening parenchymal opacities on CXR and redness over the right hand for which she was started on Cefepime and Vancomycin with blood cultures ultimately returning negative. Of note, she was on 2L/min oxygen by nasal cannula for an unknown period of time before this hospitalization and throughout the next few days she had 2-5L/min requirement and intermittently would use CPAP or BiPAP. She had negative DVT studies in the lower and upper extremities.  PCCM was consulted on 8/19 for respiratory distress and believed the picture fit pulmonary edema vs HAP. She also needed norepinephrine at the time. Antibiotics continued and she was placed on furosemide intermittently, doses held when her creatinine would rise. She was also given supplemental albumin with improvement in blood pressures and cessation of vasopressors.   ON 8/26, she was noted to be tachypneic prompting PCCM reconsultation. Failed BiPAP, intubated 8/27 with progressive shock  afterwards   Subjective: Critically ill, intubated still on high pressors. Creatinine is worse yet again    VITAL SIGNS: BP (!) 105/49   Pulse (!) 115   Temp (!) 101.7 F (38.7 C)   Resp (!) 0   Ht 5\' 5"  (1.651 m)   Wt 275 lb 5.7 oz (124.9 kg)   SpO2 94%   BMI 45.82 kg/m    INTAKE / OUTPUT: I/O last 3 completed shifts: In: 8426.8 [I.V.:5152.8; NG/GT:1994; IV [id number] Out: 1000 [Urine:300; Emesis/NG output:700]  PHYSICAL EXAMINATION: General:  Chr ill, intubated, remains now critically ill and unresponsive  Neuro:  No longer following commands. Eyes are open, RASS 0 HEENT:  Dry oral mucosa, anicteric eyes Cardiovascular:  s1 s2 RRT LUNG: diffuse rhonchi equal chest rise-->unchanged  Abdomen:  Distended, nontender, no rebound or gaurding Musculoskeletal:  All extremities have 2 pitting edema  Skin:  No major rashes seen  LABS:  BMET  Recent Labs Lab 10/13/15 0500 10/14/15 0500 2015/10/31 0415  NA 133*  131* 130* 132*  132*  K 5.4*  5.1 4.5 4.5  4.5  CL 108  108 102 99*  100*  CO2 14*  15* 19* 23  22  BUN 70*  71* 73* 82*  80*  CREATININE 2.95*  2.92* 3.19* 3.44*  3.50*  GLUCOSE 226*  229* 257* 189*  186*    Electrolytes  Recent Labs Lab 10/12/15 1600  10/13/15 0500 10/13/15 1700 10/14/15 0500 10/31/2015 0415  CALCIUM  --   < > 7.4*  7.4*  --  7.0* 7.1*  7.4*  MG 1.7  --  1.7 1.6*  --   --   PHOS 5.7*  --  5.2*  5.1* 4.7* 4.8* 4.9*  < > = values in this interval not displayed.  CBC  Recent Labs Lab 10/13/15 0500 10/14/15 0500 22-Oct-2015 0415  WBC 36.6* 36.7* 36.7*  HGB 9.2* 9.5* 9.5*  HCT 28.2* 28.9* 28.7*  PLT 190 153 144*    Coag's No results for input(s): APTT, INR in the last 168 hours.  Sepsis Markers  Recent Labs Lab 10/11/15 1102 10/11/15 1725 10/11/15 2025 10/13/15 0500 22-Oct-2015 0415  LATICACIDVEN  --  0.9 1.0  --   --   PROCALCITON 2.39  --   --  3.79 4.13    ABG  Recent Labs Lab 10/13/15 0905  10/14/15 0500 10-22-2015 0500  PHART 7.174* 7.250* 7.273*  PCO2ART 39.9 43.8 47.2  PO2ART 93.8 70.4* 59.3*    Liver Enzymes  Recent Labs Lab 10/10/15 1035 10/11/15 1800  10/13/15 0500 10/14/15 0500 Oct 22, 2015 0415  AST 158* 147*  --  159*  --   --   ALT 138* 102*  --  65*  --   --   ALKPHOS 226* 188*  --  232*  --   --   BILITOT 0.4 0.6  --  0.6  --   --   ALBUMIN 1.8* 2.4*  < > 2.4*  2.4* 1.8* 1.8*  < > = values in this interval not displayed.  Cardiac Enzymes  Recent Labs Lab 10/13/15 1359  TROPONINI 0.08*    Glucose  Recent Labs Lab 10/14/15 1145 10/14/15 1540 10/14/15 1941 10/14/15 2336 10-22-2015 0349 October 22, 2015 0802  GLUCAP 204* 215* 181* 190* 172* 182*    Imaging Nm Wbc Scan Tumor  Result Date: 10/14/2015 CLINICAL DATA:  Infection unknown origin.  Persistent leukocytosis. EXAM: NUCLEAR MEDICINE LEUKOCYTE SCAN TECHNIQUE: Following intravenous administration of radiolabeled white blood cells, images of the head, neck, trunk, and extremities were obtained on subsequent days. RADIOPHARMACEUTICALS:  [id number] millicuries [id number] labeled autologous leukocytes IV COMPARISON:  Chest CT 10/02/2015, PET-CT 09/08/2015 FINDINGS: 24 hour imaging demonstrates no clear foci of infection. There is increased uptake within the lungs bilaterally. Normal uptake in the spleen. New abnormal uptake in the bones or soft tissue of the abdomen or pelvis. IMPRESSION: 1. No clear localization of the source of infection. 2. Diffuse uptake in lungs with differential including pneumonia versus congestive heart failure. Electronically Signed   By: Suzy Bouchard M.D.   On: 10/14/2015 14:56   Dg Chest Port 1 View  Result Date: 10/22/2015 CLINICAL DATA:  Pneumonia, intubated patient EXAM: PORTABLE CHEST 1 VIEW COMPARISON:  Chest x-ray of October 14, 2015 FINDINGS: The patient is quite rotated which distorts the anatomy. There remain diffusely increased interstitial and alveolar opacities. The  hemidiaphragms remain obscured. The cardiac silhouette is enlarged. There is calcification in the wall of the aortic arch. The endotracheal tube tip lies approximately 5.4 cm above the carina. The esophagogastric tube tip projects below the inferior margin of the image. The proximal port lies at or just below the GE junction. A right internal jugular venous catheter tip projects over the proximal third of the SVC. IMPRESSION: Persistent interstitial and alveolar opacities compatible with CHF and/or pneumonia. Persistent obscuration of the hemidiaphragms. The support tubes are in reasonable position. Electronically Signed   By: David  Martinique M.D.   On: 2015-10-22 07:08     STUDIES:  CT Chest 8/17 >>Diffuse septal thickening and ground-glass opacity of the lung probably represents chronic interstitial pulmonary edema ? component of fibrosis  CULTURES: Blood and sputum cultures from pending  8/16 blood cultures negative x2 8/27 BAL cx >> neg 8/28 BCX2: GPC>>>  ANTIBIOTICS: Vancomycin >> 8/27 Imipenem 8/26 >> Zyvox 8/27 >> eraxiz 8/29>>>  LINES/TUBES: Left PICC line 8/25 >> 8/27 RIJ 8/27 >> ETT 8/27 >>   DISCUSSION: Ilianna Winchell Sharber is a 80 y.o. female with distant breast cancer history, non-Hodgkin's lymphoma with suspected recurrence based on FDG avid uptake of hilar and mediastinal lymph nodes on PET scan 09/08/15, CAD and HFpEF (echo 08/25/15 suggesting mild PH), who is chronically debilitated who presented with arthralgias, sore throat, hypotension and possible right hand cellulitis. She was treated for septic (maybe mixed with HFpEF cardiogenic shock) with IV fluids and antibiotics initially with improvement but has had a plateau-ing of her recovery. She is newly febrile once more since 10/10/15 with leukocytosis  & progressive shock/MODS. Blood cultures now growing GPC w/  Reflexed ID panel suggesting MRSA. Do not think she will survive this and in the event she does it will be w/ even more  debilitation and co-morbids. Will reach out to family. We need to consider limitations of care and define stopping points.   ASSESSMENT / PLAN:  PULMONARY A: Acute on chronic hypoxic respiratory failure - etiologies include acute on chronic pulmonary interstitial edema, possible HAP  R/o reoccurrence lymphoma,lymphangitic spread vs infectious CXR worse.  Bronch non diagnostic  P:   CT PRVC 8 cc/kg - no wean until shock resolves F/u CXR and abg PAD protocol   CARDIOVASCULAR A:  HFpEF Worsening septic shock/MODS with new fevers, r/o other causes of shock \ -->bedside echo w/out pericardial effusion 8/28 P:  Cont euvolemia goal Cont pressors for MAP gaol >65 No escalation   RENAL A:   Worsening acute kidney injury - likely multifactorial from multiple antibiotics, HFpEF and diuresis.  NAG metabolic acidosis (lactate 0.9) most likely renal failure related Hyperphosphatemia Hypomagnesemia  P:   Cont bicarb gtt Serial chemistries  Repeat Mg Replace lytes as needed   GASTROINTESTINAL A:   GERD Severe protein calorie malnutrition  Shock liver  P:   NPO Holding tubefeeds   HEMATOLOGIC A:   Hodgkin's lymphoma, maybe recurrent, hilar and mediastinal lymphadenopathy on PET in July-Follows with Dr. Martha Clan, plan was for repeat PET in October GPC bacteremia and possible HCAP (NOS) P:  Await BAL cytology Ct sub q hep Cont abx  INFECTIOUS A:   Presumptive HAP treated New fevers, leukocytosis concerning for new infection ? Source-->also possibly cancer?? -->ID wondering about Nocardia  P:   Maintain current abx ID following WBC tagged scan planned  ENDOCRINE A:    hyperglycemia   P:   CBG q 4. Sliding scale & lantus   NEUROLOGIC A:   Delirium/ acute encephalopathy  - ddx includes uremia, sepsis, medications Anxiety and depression P:   Delirium precautions  fent drip . Goal RASS -1   FAMILY  - Inter-disciplinary family meet or Palliative Care meeting  due by:  Son estranged, will involve palliative care   Erick Colace ACNP-BC [place] Pager # [phone] OR # [phone] if no answer  10/27/15

## 2015-10-17 NOTE — Progress Notes (Signed)
   2015-11-13 1600  Clinical Encounter Type  Visited With Patient;Family  Visit Type Initial;Patient actively dying  Referral From Nurse  Spiritual Encounters  Spiritual Needs Grief support;Emotional;Prayer  Stress Factors  Family Stress Factors Loss    Chaplain referred via nursing and Spiritual Care Consult.    Providing support with pt's son at bedside.   Son details history of "distance in family" and speaks with chaplain about emotional journey at end of life.  Requested prayers.  Chaplain shared prayers with son at bedside.     [place], [place]

## 2015-10-17 NOTE — Progress Notes (Signed)
Spoke to friend whom cares for her daily.  These people have been involved w/ her care for over 20 years.  Gene and Simeon Craft [phone]   They would like to be notified if things deteriorate.   Erick Colace ACNP-BC [place] Pager # [phone] OR # [phone] if no answer

## 2015-10-17 NOTE — Progress Notes (Addendum)
Palliative:  I was never able to speak/engage with family. Son did not return any of my voicemails. Noted conversation and plan with Marni Griffon, NP. Agree this plan is very appropriate. Checked in with nursing staff and they will extubate soon to comfort. RT on standby and trial of pressure support was reported done and patient remained comfortable. Son at bedside with chaplain. No needs reported from nursing. Will not engage with family and add one more person at this time. Poor prognosis and likely to pass soon after extubation. Will be available for assistance with symptom management to ensure comfort as needed.   No Charge  Vinie Sill, NP Palliative Medicine Team Pager # [phone] (M-F 8a-5p) Team Phone # [phone] (Nights/Weekends)

## 2015-10-17 NOTE — Progress Notes (Signed)
Spoke to pt's son Research officer, political party via phone. We spoke at length about the pt's current status, progressive decline and very poor prognosis. Based on this discussion we have agreed to transition to comfort.  Plan -full DNR Withdraw life support and pressors Transition to comfort care   Erick Colace ACNP-BC [place] Pager # [phone] OR # [phone] if no answer

## 2015-10-17 NOTE — Progress Notes (Addendum)
Notified MD of pt distended belly and residuals of more than 400 cc. MD ordered to stop tube feeds and put to low intermittent suction. Also notified of low UO throughout night. New orders given. Will continue to monitor. I have reached out and left messages on Her son's phone both at work and home.   Erick Colace ACNP-BC [place] Pager # [phone] OR # [phone] if no answer

## 2015-10-17 DEATH — deceased

## 2015-10-21 ENCOUNTER — Telehealth: Payer: Self-pay

## 2015-10-21 NOTE — Telephone Encounter (Signed)
On 10/21/2015 I received a death certificate from [place] (original). The death certificate is for cremation. The patient is a patient of Doctor Elsworth Soho. The death certificate will be signed by Doctor Halford Chessman since Doctor Elsworth Soho is on vacation this week. The death certificate will be taken to [facility] (2300) this pm for signature.  On 2015/11/18 I received the death certificate back from Doctor Monette. I got the death certificate ready and called the funeral home to let them know the death certificate was mailed to the [facility] per the funeral home request.

## 2015-10-21 NOTE — Telephone Encounter (Signed)
On 10-31-2015 I received a death certificate from [place] (faxed). The death certificate is for cremation. The patient is a patient of Doctor Elsworth Soho. The death certificate will be taken to [facility] [place] this am for signature.  On 2015-10-31 I received the death certificate back from Doctor Halford Chessman who signed the death certificate for Doctor Elsworth Soho. I got the death certificate ready and faxed the death certificate to the funeral home per their request.

## 2015-10-27 ENCOUNTER — Ambulatory Visit (HOSPITAL_BASED_OUTPATIENT_CLINIC_OR_DEPARTMENT_OTHER): Admission: RE | Admit: 2015-10-27 | Payer: PPO | Source: Ambulatory Visit | Admitting: Obstetrics and Gynecology

## 2015-10-27 HISTORY — DX: Personal history of other diseases of the digestive system: Z87.19

## 2015-10-27 HISTORY — DX: Diffuse large B-cell lymphoma, unspecified site: C83.30

## 2015-10-27 HISTORY — DX: Personal history of malignant neoplasm of breast: Z85.3

## 2015-10-27 HISTORY — DX: Personal history of other mental and behavioral disorders: Z86.59

## 2015-10-27 HISTORY — DX: Diaphragmatic hernia without obstruction or gangrene: K44.9

## 2015-10-27 HISTORY — DX: Abnormal findings on diagnostic imaging of other specified body structures: R93.89

## 2015-10-27 HISTORY — DX: Other specified types of non-hodgkin lymphoma, unspecified site: C85.80

## 2015-10-27 HISTORY — DX: Presence of coronary angioplasty implant and graft: Z95.5

## 2015-10-27 HISTORY — DX: Postmenopausal bleeding: N95.0

## 2015-10-27 HISTORY — DX: Obstructive sleep apnea (adult) (pediatric): G47.33

## 2015-10-27 HISTORY — DX: Dyspnea, unspecified: R06.00

## 2015-10-27 HISTORY — DX: Occlusion and stenosis of bilateral carotid arteries: I65.23

## 2015-10-27 SURGERY — DILATATION & CURETTAGE/HYSTEROSCOPY WITH MYOSURE
Anesthesia: Choice

## 2015-11-06 DIAGNOSIS — Z515 Encounter for palliative care: Secondary | ICD-10-CM

## 2015-11-12 LAB — FUNGUS CULTURE WITH STAIN

## 2015-11-12 LAB — FUNGAL ORGANISM REFLEX

## 2015-11-12 LAB — FUNGUS CULTURE RESULT

## 2015-11-16 NOTE — Discharge Summary (Signed)
PULMONARY / CRITICAL CARE MEDICINE   Name: Buffy Jarecki Gauna MRN: [id number] DOB: 1935-10-13    ADMISSION DATE:  10/14/2015 CONSULTATION DATE:  10/11/15  REFERRING MD:  Dr. Marthenia Rolling  REASON FOR CONSULT:  Respiratory distress  HISTORY OF PRESENT ILLNESS:   [name] is a 80 y.o. female with distant breast cancer history, non-Hodgkin's lymphoma with suspected recurrence based on FDG avid uptake of hilar and mediastinal lymph nodes on PET scan 09/08/15, CAD and HFpEF (echo 08/25/15 suggesting mild PH), who is chronically debilitated. History is obtained from chart review as the patient is too somnolent to provide it herself. She presented to the [facility] ED on 10/07/2015 complaining of sore throat and joint pain in multiple koints including right hand and wrist. She did not have any infectious symptoms recorded like fevers, cough or GI upset. She did have increased bilateral leg swelling. She was leukocytosis, slightly hypotensive (responded to IV fluids), had worsening parenchymal opacities on CXR and redness over the right hand for which she was started on Cefepime and Vancomycin with blood cultures ultimately returning negative. Of note, she was on 2L/min oxygen by nasal cannula for an unknown period of time before this hospitalization and throughout the next few days she had 2-5L/min requirement and intermittently would use CPAP or BiPAP. She had negative DVT studies in the lower and upper extremities.  PCCM was consulted on 8/19 for respiratory distress and believed the picture fit pulmonary edema vs HAP. She also needed norepinephrine at the time. Antibiotics continued and she was placed on furosemide intermittently, doses held when her creatinine would rise. She was also given supplemental albumin with improvement in blood pressures and cessation of vasopressors.   ON 8/26, she was noted to be tachypneic prompting PCCM reconsultation. Failed BiPAP, intubated 8/27 with progressive shock  afterwards    STUDIES:  CT Chest 8/17 >>Diffuse septal thickening and ground-glass opacity of the lung probably represents chronic interstitial pulmonary edema ? component of fibrosis  CULTURES: Blood and sputum cultures from pending  8/16 blood cultures negative x2 8/27 BAL cx >> neg 8/28 BCX2: GPC>>>  ANTIBIOTICS: Vancomycin >> 8/27 Imipenem 8/26 >> Zyvox 8/27 >> eraxiz 8/29>>>  LINES/TUBES: Left PICC line 8/25 >> 8/27 RIJ 8/27 >> ETT 8/27 >>     ASSESSMENT / PLAN:  PULMONARY A: Acute on chronic hypoxic respiratory failure - etiologies include acute on chronic pulmonary interstitial edema, possible HAP  R/o reoccurrence lymphoma,lymphangitic spread vs infectious CXR worse.  Bronch non diagnostic  P:   CT PRVC 8 cc/kg - no wean until shock resolves   CARDIOVASCULAR A:  HFpEF Worsening septic shock/MODS with new fevers, r/o other causes of shock \ -->bedside echo w/out pericardial effusion 8/28 P:  Cont pressors for MAP gaol >65   RENAL A:   Worsening acute kidney injury - likely multifactorial from multiple antibiotics, HFpEF and diuresis.  NAG metabolic acidosis (lactate 0.9) most likely renal failure related Hyperphosphatemia Hypomagnesemia  P:   Cont bicarb gtt Serial chemistries  Repeat Mg Replace lytes as needed   GASTROINTESTINAL A:   GERD Severe protein calorie malnutrition  Shock liver  P:   NPO Holding tubefeeds   HEMATOLOGIC A:   Hodgkin's lymphoma, maybe recurrent, hilar and mediastinal lymphadenopathy on PET in July-Follows with Dr. Martha Clan, plan was for repeat PET in October GPC bacteremia and possible HCAP (NOS) P:  Ct sub q hep Cont abx  INFECTIOUS A:   Presumptive HAP treated New fevers, leukocytosis concerning for new infection ?  Source-->also possibly cancer?? -->ID wondering about Nocardia  P:   Maintain current abx ID following WBC tagged scan not helpful    FAMILY  - Inter-disciplinary family meet or  Palliative Care meeting due by:  Son estranged,  involved palliative care  She was transitioned to full comfort care on 10/16/2022 and she passed away soon after  Cause of death- septic shock with multiorgan failure, underlying lymphoma  Kara Mead MD. FCCP. [place] [facility]   10/27/2015     10/27/2015

## 2015-11-17 ENCOUNTER — Ambulatory Visit (HOSPITAL_COMMUNITY): Payer: PPO

## 2015-11-24 ENCOUNTER — Other Ambulatory Visit: Payer: Self-pay

## 2015-11-24 ENCOUNTER — Ambulatory Visit: Payer: Self-pay | Admitting: Hematology & Oncology

## 2015-11-26 LAB — ACID FAST CULTURE WITH REFLEXED SENSITIVITIES: ACID FAST CULTURE - AFSCU3: NEGATIVE

## 2017-02-06 IMAGING — US US ABDOMEN COMPLETE
1 series · 14 of 25 positions shown · non-contrast
Comparison: 08/28/2015 and prior CTs.

CLINICAL DATA: 80-year-old female with elevated LFTs. History of
lymphoma and cholecystectomy.

EXAM:
ABDOMEN ULTRASOUND COMPLETE

[Series 1: us abdomen complete · 0.25mm/px · 14 of 164 slices shown]
[im 1/164]
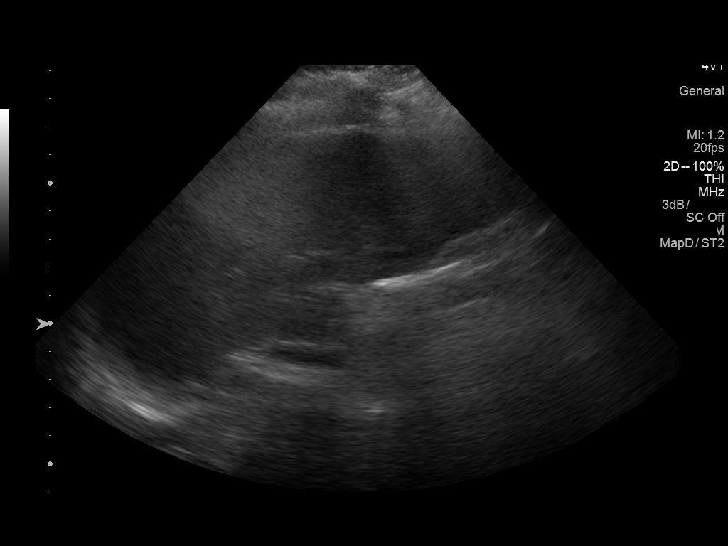
[im 14/164]
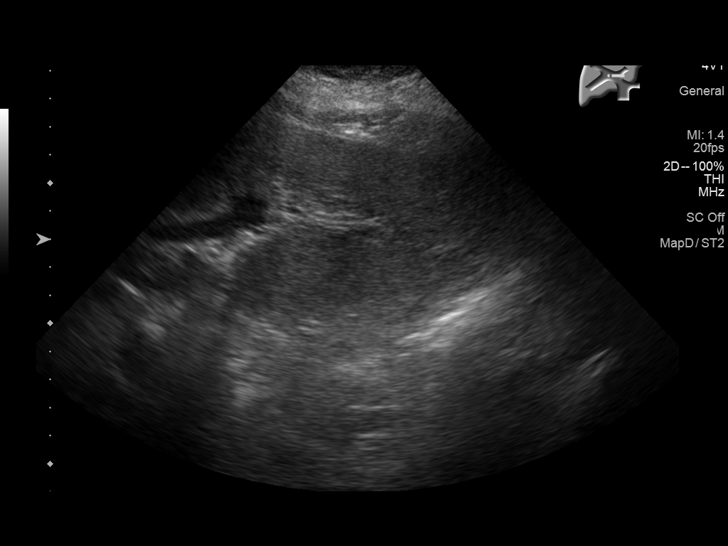
[im 28/164]
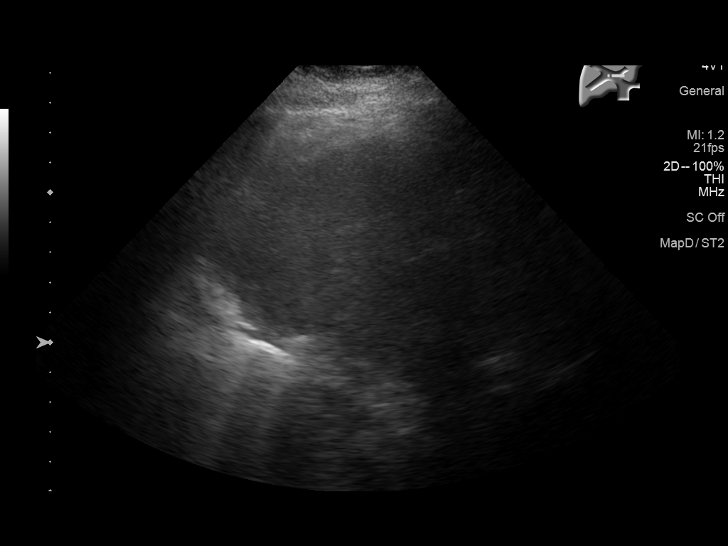
[im 41/164]
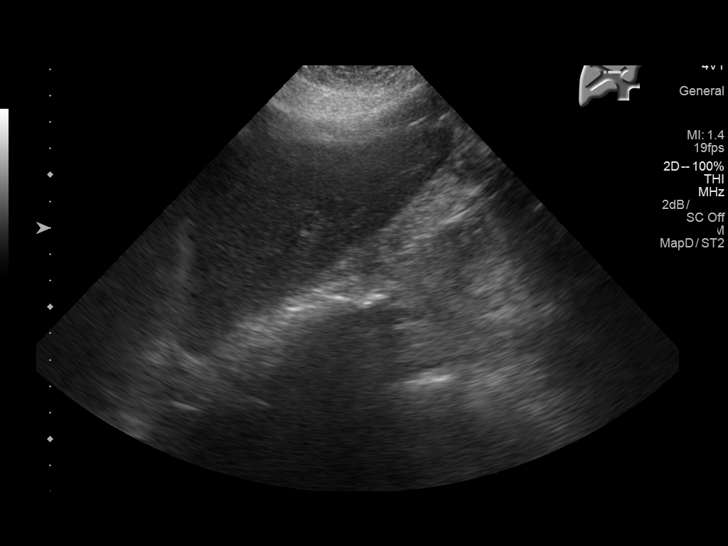
[im 55/164]
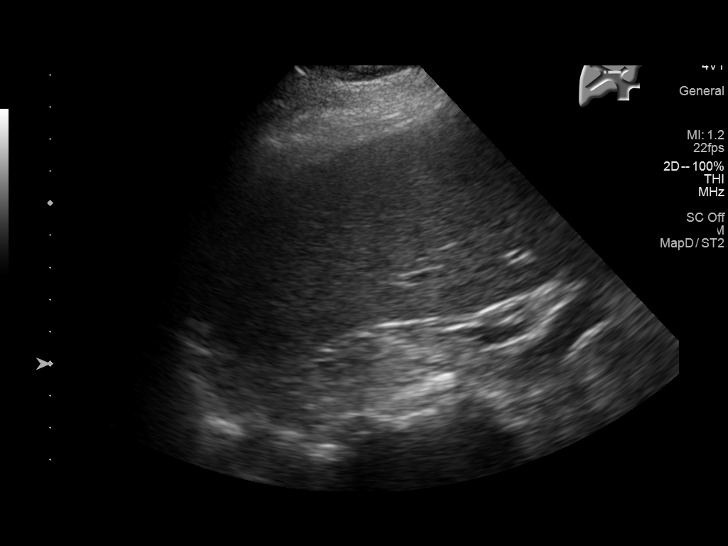
[im 62/164]
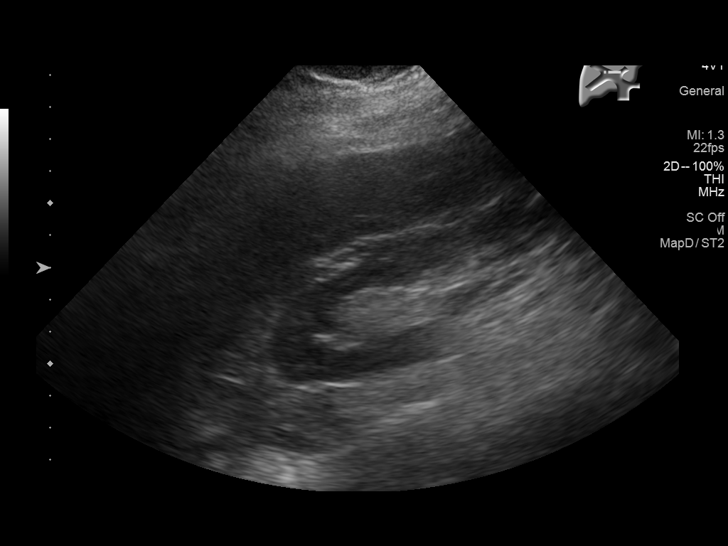
[im 75/164]
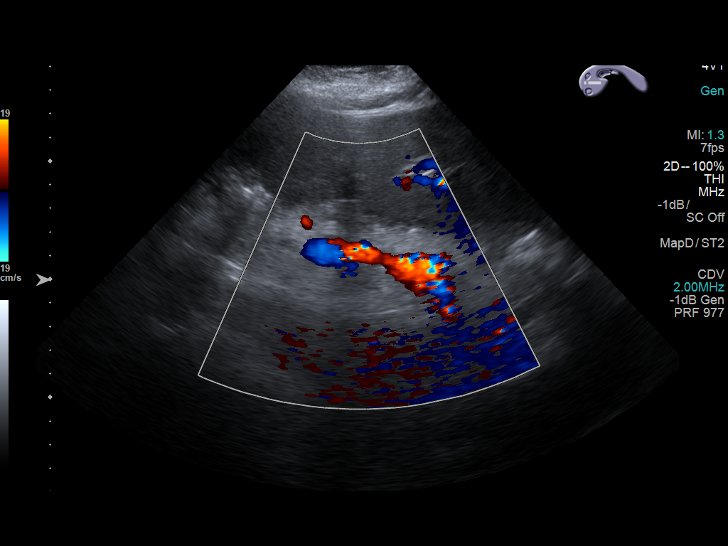
[im 89/164]
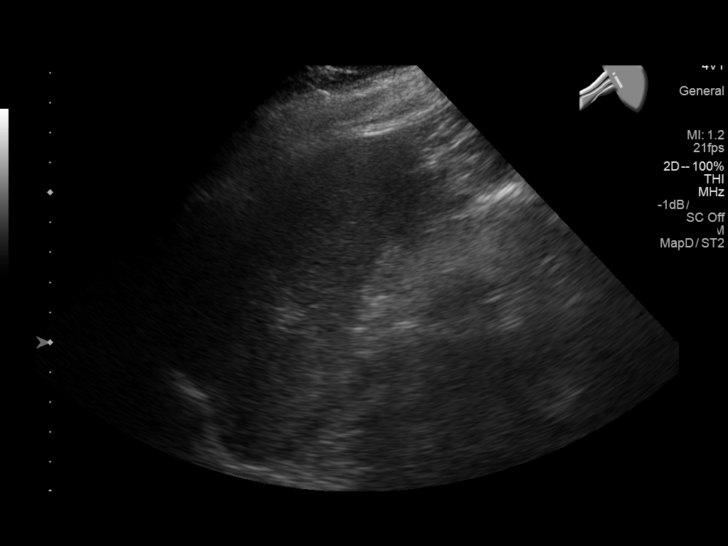
[im 102/164]
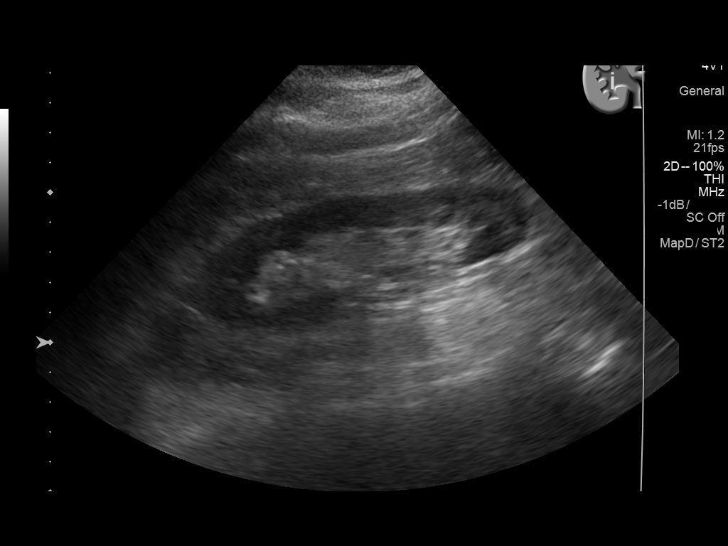
[im 109/164]
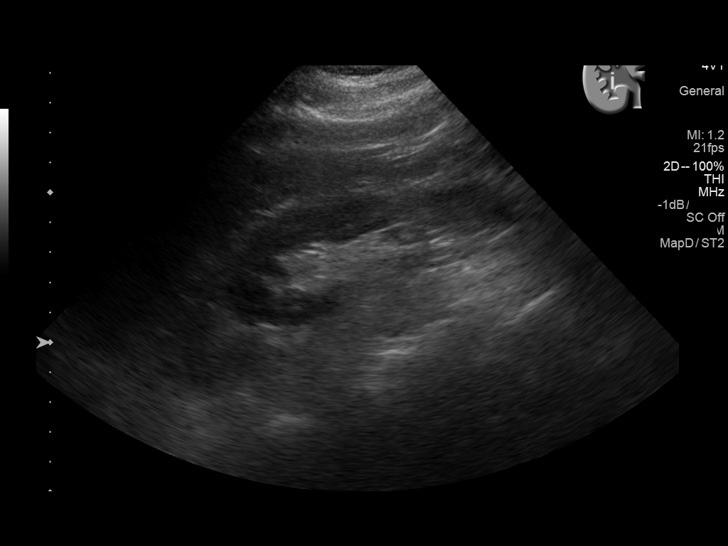
[im 123/164]
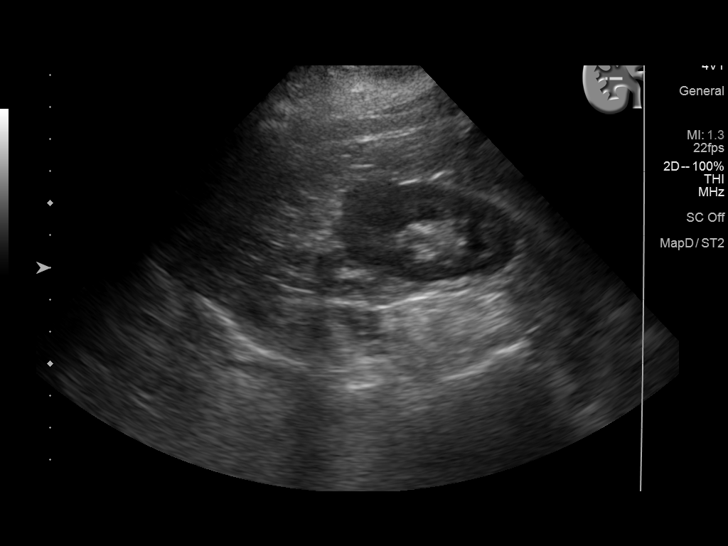
[im 136/164]
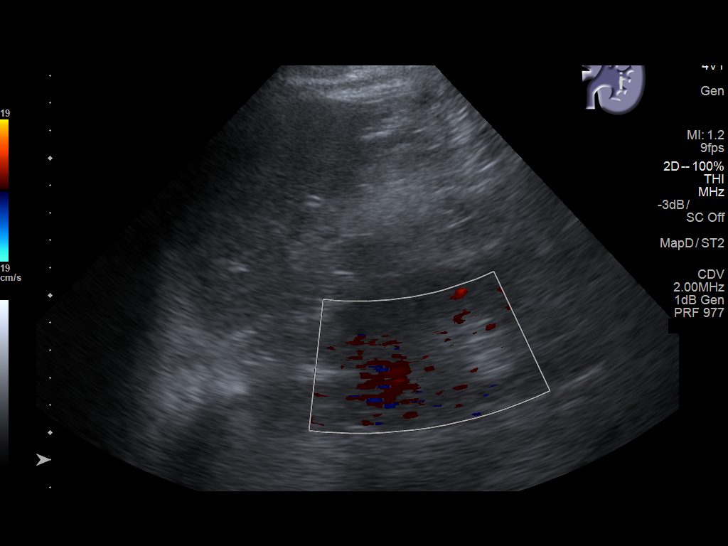
[im 150/164]
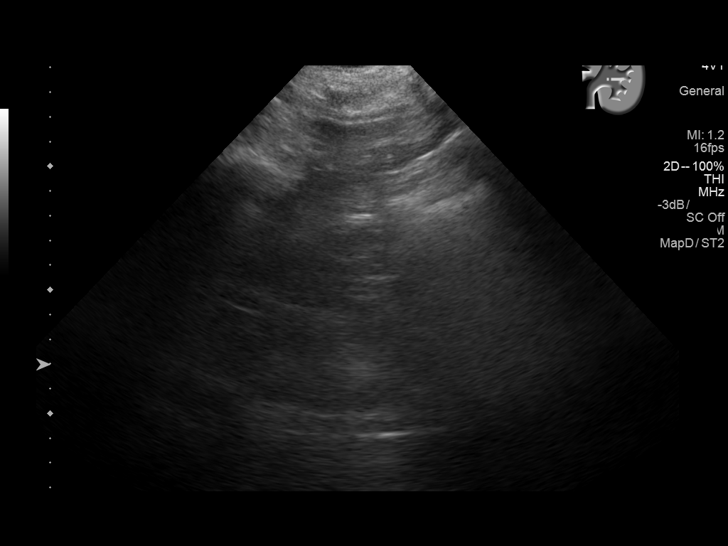
[im 164/164]
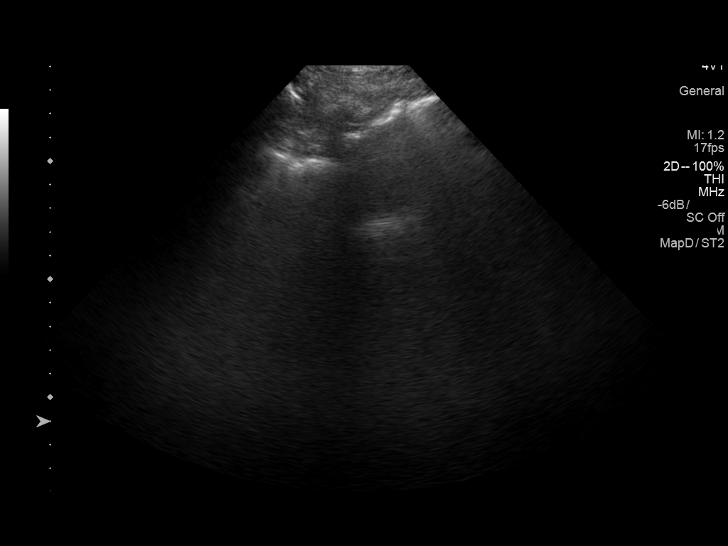

[14 of 25 positions shown; findings below may reference images not displayed]

FINDINGS: Gallbladder: Not visualized compatible with cholecystectomy.

Common bile duct: Diameter: 3.2 mm. There is no evidence of
intrahepatic or extrahepatic biliary dilatation.

Liver: No focal lesion identified. Within normal limits in
parenchymal echogenicity.

IVC: No abnormality visualized.

Pancreas: Visualized portion unremarkable.

Spleen: Size and appearance within normal limits.

Right Kidney: Length: 12 cm. Echogenicity within normal limits. No
mass or hydronephrosis visualized.

Left Kidney: Length: 12.8 cm. Echogenicity within normal limits. No
mass or hydronephrosis visualized.

Abdominal aorta: No aneurysm visualized but the distal abdominal
aorta is not visualized secondary overlying bowel gas.

Other findings: None.
IMPRESSION: No evidence of acute or hepatic abnormality.

Distal aorta not well visualized.

## 2018-05-12 IMAGING — CT NM PET TUM IMG RESTAG (PS) SKULL BASE T - THIGH
7 series · 25 of 25 positions shown · non-contrast
Comparison: None.

CLINICAL DATA: Subsequent treatment strategy for non-Hodgkin's
lymphoma. Diffuse large cell lymphoma.

EXAM:
NUCLEAR MEDICINE PET SKULL BASE TO THIGH
TECHNIQUE: 10.8 mCi F-18 FDG was injected intravenously. Full-ring PET imaging
was performed from the skull base to thigh after the radiotracer. CT
data was obtained and used for attenuation correction and anatomic
localization.
FASTING BLOOD GLUCOSE:  Value: 147 mg/dl

[Series 3: pet sk_thigh ac · axial · 5.0mm · 4.07mm/px · z∈[-878,-62]mm · 5 of 205 slices shown]
[im 1/205]
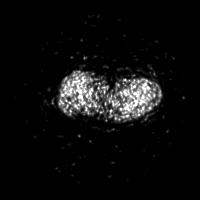
[im 52/205]
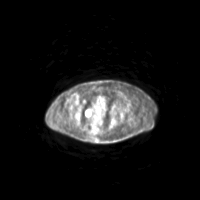
[im 103/205]
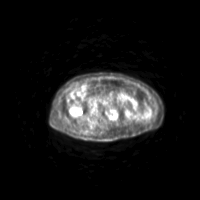
[im 154/205]
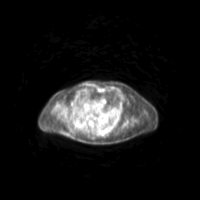
[im 205/205]
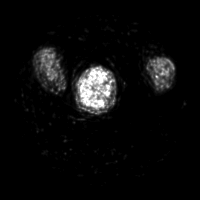

[Series 4: ct sk_thigh 5.0 hd_fov · axial · 5.0mm · 1.07mm/px · z∈[-878,-62]mm · 5 of 205 slices shown]
[im 1/205]
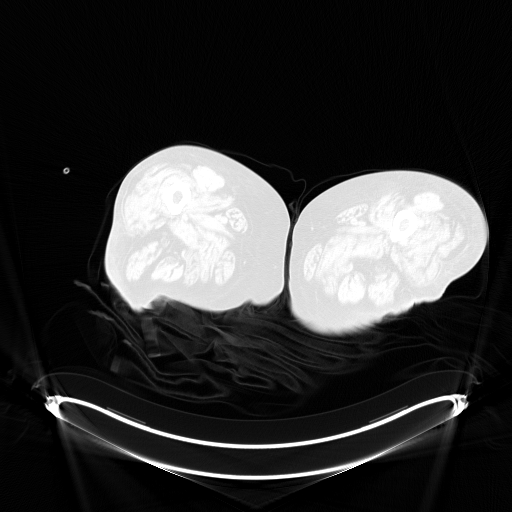
[im 52/205]
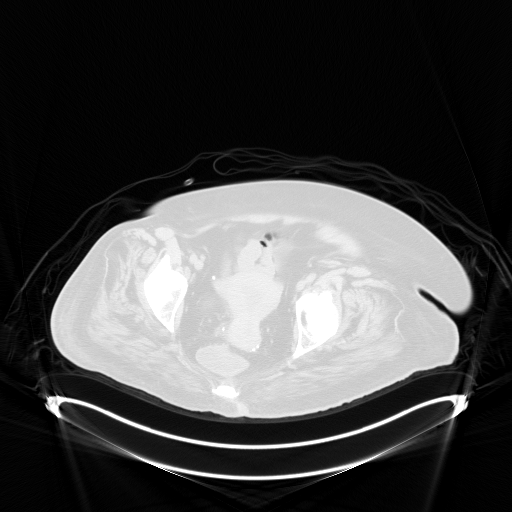
[im 103/205]
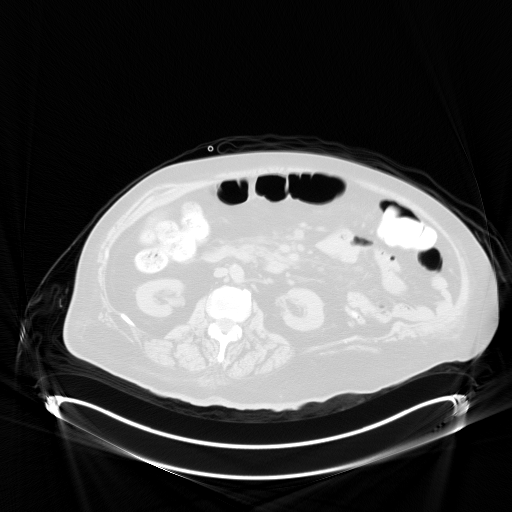
[im 154/205]
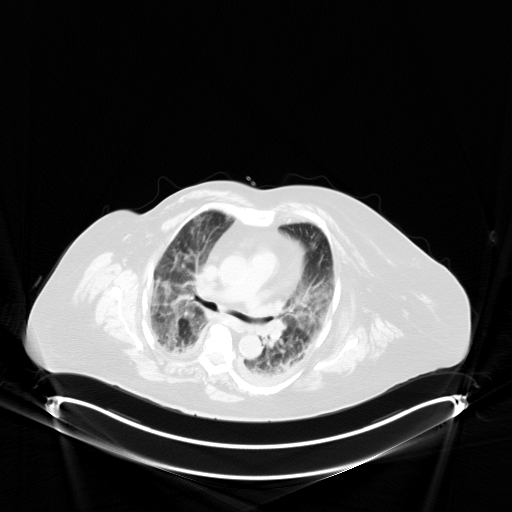
[im 205/205  brain]
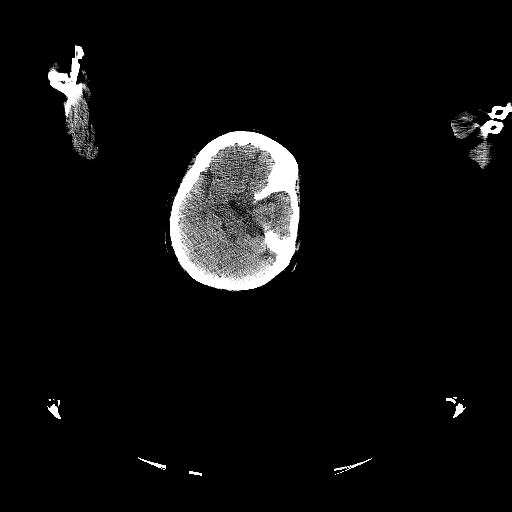

[Series 7: pet sk_thigh nac · axial · 5.0mm · 4.07mm/px · z∈[-878,-62]mm · 5 of 205 slices shown]
[im 1/205]
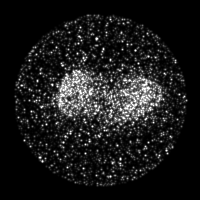
[im 52/205]
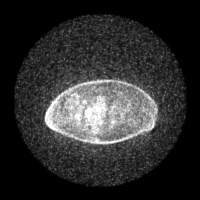
[im 103/205]
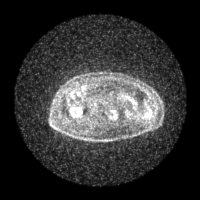
[im 154/205]
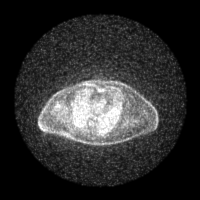
[im 205/205]
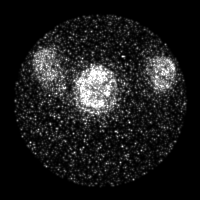

[Series 8: ct sk_thigh 5.0 b70f (id)_bone · axial · 5.0mm · 0.62mm/px · z∈[-422,-150]mm · 2 of 69 slices shown]
[im 1/69  bone]
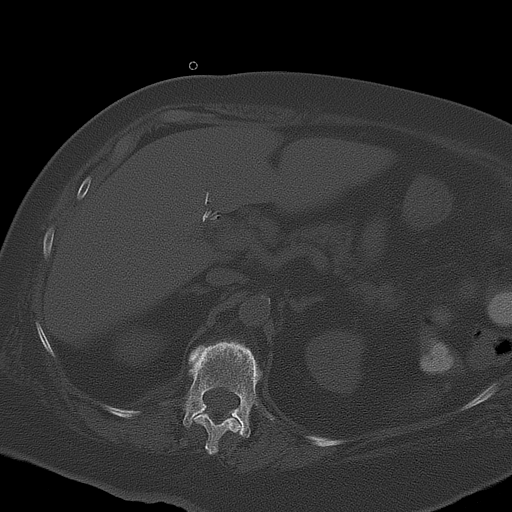
[im 69/69  bone]
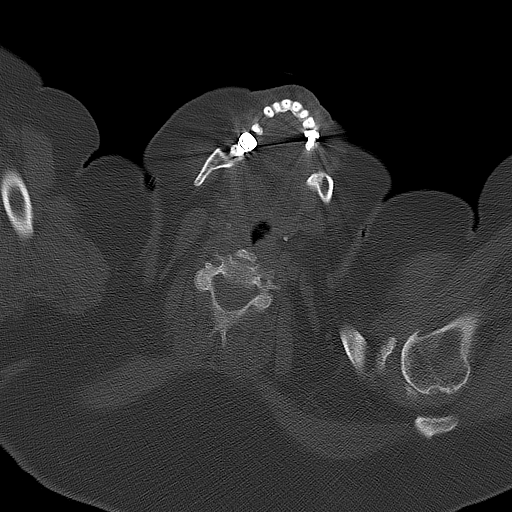

[Series 603: range-ct sk_thigh 5.0 hd_fov-cor-<alpha range> · 2 of 66 slices shown]
[im 1/66]
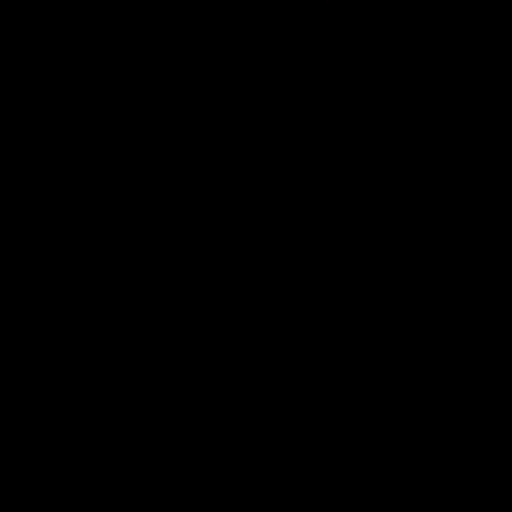
[im 66/66]
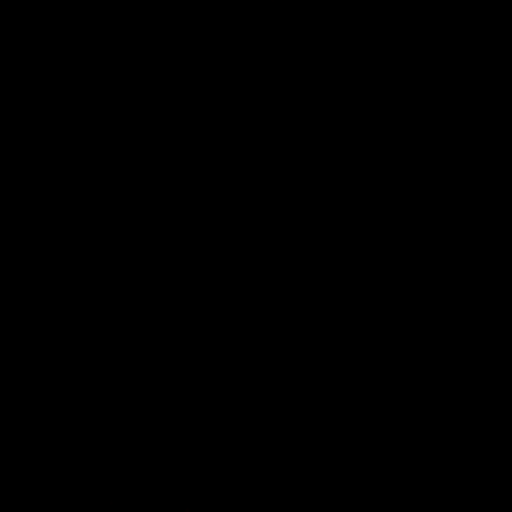

[Series 604: mip collection · coronal · 1.69mm/px · 1 of 32 slices shown]
[im 1/32]
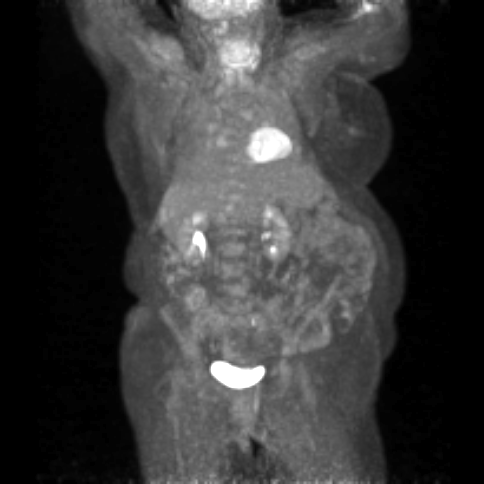

[Series 605: range-ct sk_thigh 5.0 hd_fov-tra-<alpha range> · 5 of 192 slices shown]
[im 1/192]
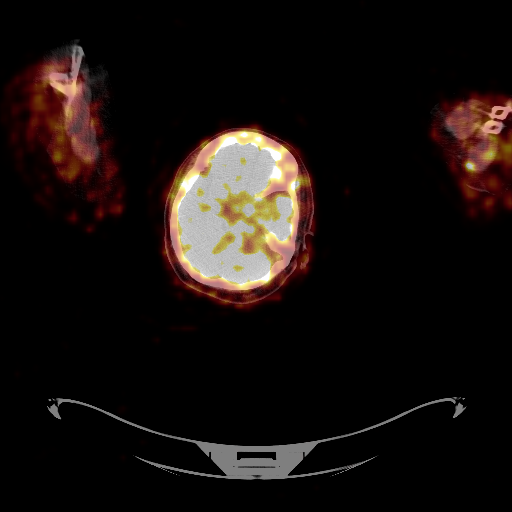
[im 48/192]
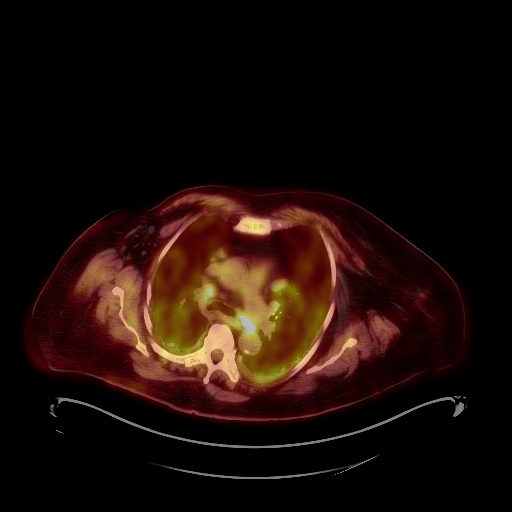
[im 96/192]
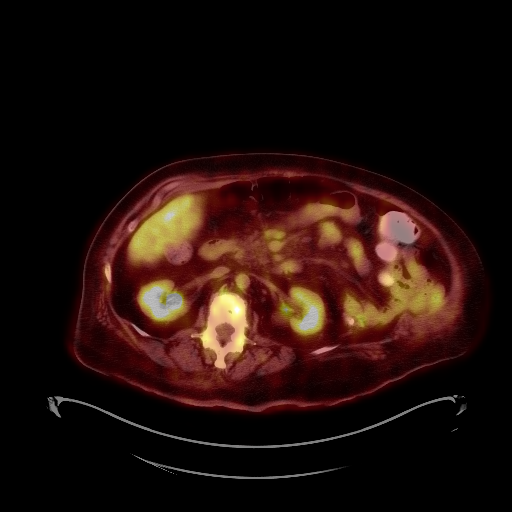
[im 144/192]
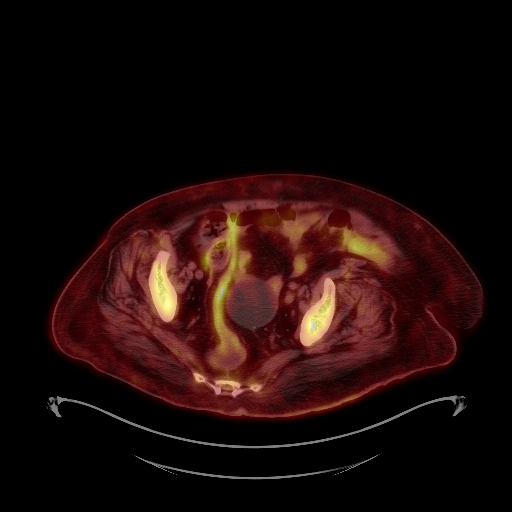
[im 192/192]
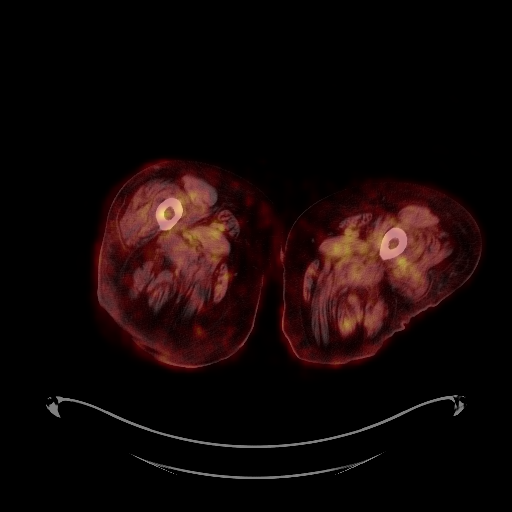

[25 of 25 positions shown; findings below may reference images not displayed]

FINDINGS: NECK

No hypermetabolic lymph nodes in the neck.

CHEST

Symmetric hilar metabolic activity associated with small lymph nodes
ahve increased slightly with SUV max equals 7.5 compared to 5.5.

Subcarinal lymph node increased in size and metabolic activity of
SUV max equals 7.6 increased from 3.6. Lymph node measures 14 mm in
short axis compared to 8 mm.

There is new diffuse interstitial thickening and interlobular septal
thickening within the LEFT RIGHT lower lobe. Small effusions are
present. No focal metabolic activity in the lung parenchyma.

ABDOMEN/PELVIS

Periportal lymph node is decreased in metabolic activity with SUV
max equal 5.8 compared to 6.2. Lesion is normal size. No new
hypermetabolic retroperitoneal nodes. No iliac lymphadenopathy.

No abnormal metabolic activity the liver spleen. Kidneys and
pancreas normal. Laxity of the LEFT lateral abdominal wall which is
more prominent than prior.

SKELETON

No focal hypermetabolic activity to suggest skeletal metastasis.
IMPRESSION: 1. Mild increase in metabolic activity of bilateral hilar lymph
nodes and mediastinal lymph nodes. Differential includes reactive
adenopathy versus recurrent lymphoma.
2. New interstitial thickening within the lower lobes and effusions.
Findings are suggestive of congestive heart failure but cannot
exclude pulmonary infection.
3. Stable hypermetabolic periportal lymph node which is normal size.
4. Normal spleen.  No iliac lymphadenopathy.
5. Normal bone marrow.
These results will be called to the ordering clinician or
representative by the Radiologist Assistant, and communication
documented in the PACS or zVision Dashboard.
